# Patient Record
Sex: Female | Born: 1940
Health system: Southern US, Community
[De-identification: ages and names within clinical notes are randomized; demographics above are authoritative.]

## PROBLEM LIST (undated history)

## (undated) DIAGNOSIS — E1165 Type 2 diabetes mellitus with hyperglycemia: Secondary | ICD-10-CM

## (undated) DIAGNOSIS — N179 Acute kidney failure, unspecified: Secondary | ICD-10-CM

## (undated) DIAGNOSIS — R55 Syncope and collapse: Secondary | ICD-10-CM

## (undated) DIAGNOSIS — E785 Hyperlipidemia, unspecified: Secondary | ICD-10-CM

## (undated) DIAGNOSIS — I10 Essential (primary) hypertension: Secondary | ICD-10-CM

## (undated) DIAGNOSIS — H269 Unspecified cataract: Secondary | ICD-10-CM

## (undated) DIAGNOSIS — D689 Coagulation defect, unspecified: Secondary | ICD-10-CM

## (undated) DIAGNOSIS — M109 Gout, unspecified: Secondary | ICD-10-CM

## (undated) DIAGNOSIS — I610 Nontraumatic intracerebral hemorrhage in hemisphere, subcortical: Secondary | ICD-10-CM

## (undated) DIAGNOSIS — I700 Atherosclerosis of aorta: Secondary | ICD-10-CM

## (undated) DIAGNOSIS — K219 Gastro-esophageal reflux disease without esophagitis: Secondary | ICD-10-CM

## (undated) DIAGNOSIS — I2510 Atherosclerotic heart disease of native coronary artery without angina pectoris: Secondary | ICD-10-CM

## (undated) DIAGNOSIS — I639 Cerebral infarction, unspecified: Secondary | ICD-10-CM

## (undated) DIAGNOSIS — Z9181 History of falling: Secondary | ICD-10-CM

## (undated) DIAGNOSIS — R42 Dizziness and giddiness: Secondary | ICD-10-CM

## (undated) DIAGNOSIS — IMO0002 Reserved for concepts with insufficient information to code with codable children: Secondary | ICD-10-CM

## (undated) DIAGNOSIS — I219 Acute myocardial infarction, unspecified: Secondary | ICD-10-CM

## (undated) DIAGNOSIS — R51 Headache: Secondary | ICD-10-CM

## (undated) DIAGNOSIS — M1712 Unilateral primary osteoarthritis, left knee: Secondary | ICD-10-CM

## (undated) DIAGNOSIS — R519 Headache, unspecified: Secondary | ICD-10-CM

## (undated) DIAGNOSIS — Z86711 Personal history of pulmonary embolism: Secondary | ICD-10-CM

## (undated) DIAGNOSIS — M1990 Unspecified osteoarthritis, unspecified site: Secondary | ICD-10-CM

## (undated) DIAGNOSIS — G4730 Sleep apnea, unspecified: Secondary | ICD-10-CM

## (undated) HISTORY — PX: TUBAL LIGATION: SHX77

## (undated) HISTORY — DX: Gout, unspecified: M10.9

## (undated) HISTORY — PX: FOOT SURGERY: SHX648

## (undated) HISTORY — DX: History of falling: Z91.81

## (undated) HISTORY — PX: EYE SURGERY: SHX253

## (undated) HISTORY — DX: Headache: R51

## (undated) HISTORY — DX: Headache, unspecified: R51.9

## (undated) HISTORY — DX: Essential (primary) hypertension: I10

## (undated) HISTORY — DX: Nontraumatic intracerebral hemorrhage in hemisphere, subcortical: I61.0

## (undated) HISTORY — PX: TOTAL KNEE ARTHROPLASTY: SHX125

## (undated) HISTORY — DX: Coagulation defect, unspecified: D68.9

## (undated) HISTORY — DX: Morbid (severe) obesity due to excess calories: E66.01

## (undated) HISTORY — DX: Dizziness and giddiness: R42

## (undated) HISTORY — PX: PARTIAL HYSTERECTOMY: SHX80

## (undated) HISTORY — DX: Acute kidney failure, unspecified: N17.9

## (undated) HISTORY — DX: Unspecified osteoarthritis, unspecified site: M19.90

## (undated) HISTORY — PX: TONSILLECTOMY: SUR1361

## (undated) HISTORY — DX: Atherosclerotic heart disease of native coronary artery without angina pectoris: I25.10

## (undated) HISTORY — DX: Reserved for concepts with insufficient information to code with codable children: IMO0002

## (undated) HISTORY — DX: Atherosclerosis of aorta: I70.0

## (undated) HISTORY — DX: Unspecified cataract: H26.9

## (undated) HISTORY — DX: Hyperlipidemia, unspecified: E78.5

## (undated) HISTORY — DX: Personal history of pulmonary embolism: Z86.711

## (undated) HISTORY — DX: Type 2 diabetes mellitus with hyperglycemia: E11.65

---

## 1998-01-07 ENCOUNTER — Other Ambulatory Visit: Admission: RE | Admit: 1998-01-07 | Discharge: 1998-01-07 | Payer: Self-pay | Admitting: *Deleted

## 1998-03-28 ENCOUNTER — Ambulatory Visit: Admission: RE | Admit: 1998-03-28 | Discharge: 1998-03-28 | Payer: Self-pay | Admitting: Neurology

## 1998-08-23 ENCOUNTER — Ambulatory Visit: Admission: RE | Admit: 1998-08-23 | Discharge: 1998-08-23 | Payer: Self-pay | Admitting: Internal Medicine

## 1998-12-20 ENCOUNTER — Ambulatory Visit (HOSPITAL_COMMUNITY): Admission: RE | Admit: 1998-12-20 | Discharge: 1998-12-20 | Payer: Self-pay | Admitting: Otolaryngology

## 1998-12-20 ENCOUNTER — Encounter: Payer: Self-pay | Admitting: Otolaryngology

## 1999-02-01 ENCOUNTER — Encounter: Payer: Self-pay | Admitting: Otolaryngology

## 1999-02-02 ENCOUNTER — Ambulatory Visit (HOSPITAL_COMMUNITY): Admission: RE | Admit: 1999-02-02 | Discharge: 1999-02-03 | Payer: Self-pay | Admitting: Otolaryngology

## 1999-05-29 ENCOUNTER — Ambulatory Visit: Admission: RE | Admit: 1999-05-29 | Discharge: 1999-05-29 | Payer: Self-pay | Admitting: Otolaryngology

## 1999-08-08 ENCOUNTER — Other Ambulatory Visit: Admission: RE | Admit: 1999-08-08 | Discharge: 1999-08-08 | Payer: Self-pay | Admitting: *Deleted

## 2004-12-01 ENCOUNTER — Ambulatory Visit: Payer: Self-pay | Admitting: Family Medicine

## 2005-01-19 ENCOUNTER — Inpatient Hospital Stay (HOSPITAL_COMMUNITY): Admission: EM | Admit: 2005-01-19 | Discharge: 2005-01-26 | Payer: Self-pay | Admitting: *Deleted

## 2005-01-19 ENCOUNTER — Encounter (INDEPENDENT_AMBULATORY_CARE_PROVIDER_SITE_OTHER): Payer: Self-pay | Admitting: *Deleted

## 2005-02-28 ENCOUNTER — Ambulatory Visit (HOSPITAL_COMMUNITY): Admission: RE | Admit: 2005-02-28 | Discharge: 2005-02-28 | Payer: Self-pay | Admitting: Family Medicine

## 2007-06-22 ENCOUNTER — Other Ambulatory Visit: Payer: Self-pay

## 2007-06-22 ENCOUNTER — Emergency Department: Payer: Self-pay | Admitting: Emergency Medicine

## 2008-04-24 ENCOUNTER — Emergency Department (HOSPITAL_COMMUNITY): Admission: EM | Admit: 2008-04-24 | Discharge: 2008-04-25 | Payer: Self-pay | Admitting: Emergency Medicine

## 2008-05-05 ENCOUNTER — Ambulatory Visit: Payer: Self-pay | Admitting: Cardiovascular Disease

## 2008-05-12 ENCOUNTER — Ambulatory Visit: Payer: Self-pay

## 2009-03-22 ENCOUNTER — Observation Stay (HOSPITAL_COMMUNITY): Admission: EM | Admit: 2009-03-22 | Discharge: 2009-03-23 | Payer: Self-pay | Admitting: Emergency Medicine

## 2009-03-22 ENCOUNTER — Ambulatory Visit: Payer: Self-pay | Admitting: Internal Medicine

## 2009-04-24 DIAGNOSIS — G4730 Sleep apnea, unspecified: Secondary | ICD-10-CM

## 2009-04-24 HISTORY — DX: Sleep apnea, unspecified: G47.30

## 2009-05-13 ENCOUNTER — Ambulatory Visit: Payer: Self-pay | Admitting: Internal Medicine

## 2009-05-13 DIAGNOSIS — I10 Essential (primary) hypertension: Secondary | ICD-10-CM | POA: Insufficient documentation

## 2010-04-24 DIAGNOSIS — Z86711 Personal history of pulmonary embolism: Secondary | ICD-10-CM

## 2010-04-24 DIAGNOSIS — I219 Acute myocardial infarction, unspecified: Secondary | ICD-10-CM

## 2010-04-24 HISTORY — DX: Acute myocardial infarction, unspecified: I21.9

## 2010-04-24 HISTORY — DX: Personal history of pulmonary embolism: Z86.711

## 2010-05-26 NOTE — Assessment & Plan Note (Signed)
Summary: EST WAS SEEN BY NISHAN IN JAN WANTS DR. DAN TO F/U WITH PT IN...   Visit Type:  ec6 Primary Provider:  Dr. Terance Hart  CC:  no complaints.  History of Present Illness: 70 y/o female with h/o HTN, HL and diabetes.   Saw Dr. Eden Emms last year for CP and dyspnea. Had adenosine myoview which was   Recently admitted to [facility] with atypical CP. Cardiac markers negative. Thoguht to be Gi in nature.   No further CP. Occasional dyspnea with moderate activity which she attributes to gaining 10 or 15 pounds. Occasional fatigue. Denies snoring. No edema, orthopnea, pnod or palpitations.   Current Medications (verified): 1)  Actos 30 Mg Tabs (Pioglitazone Hcl) .Marland Kitchen.. 1 By Mouth Once Daily 2)  Aspirin 81 Mg Tbec (Aspirin) .... Take One Tablet By Mouth Daily 3)  Lisinopril-Hydrochlorothiazide 20-12.5 Mg Tabs (Lisinopril-Hydrochlorothiazide) .Marland Kitchen.. 1 By Mouth Two Times A Day 4)  Metoprolol Tartrate 100 Mg Tabs (Metoprolol Tartrate) .... Take One Tablet By Mouth Twice A Day 5)  Amlodipine Besylate 5 Mg Tabs (Amlodipine Besylate) .... Take One Tablet By Mouth Daily  Allergies (verified): 1)  ! Pcn  Review of Systems       As per HPI and past medical history; otherwise all systems negative.   Vital Signs:  Patient profile:   70 year old female Weight:      249 pounds Pulse rate:   50 / minute Pulse rhythm:   regular BP sitting:   142 / 60  (left arm) Cuff size:   large  Vitals Entered By: Mercer Pod (May 13, 2009 11:53 AM)  Physical Exam  General:  Gen: well appearing. no resp difficulty HEENT: normal Neck: supple. no JVD. Carotids 2+ bilat; no bruits. No lymphadenopathy or thryomegaly appreciated. Cor: PMI nondisplaced. Regular rate & rhythm. No rubs, gallops, murmur. Lungs: clear Abdomen: obese soft, nontender, nondistended. bowel sounds. Extremities: no cyanosis, clubbing, rash, edema Neuro: alert & orientedx3, cranial nerves grossly intact. moves all 4  extremities w/o difficulty. affect pleasant    Impression & Recommendations:  Problem # 1:  CHEST TIGHTNESS-PRESSURE-OTHER ([id number]) Resolved. No further work-up at this time.  Problem # 2:  HYPERTENSION, BENIGN (ICD-401.1) Mildly elevated will increase amlodipine to 10qd.  Problem # 3:  BRADYCARDIA (ICD-427.89) Decrease metorpolol to 50 two times a day. Consider change to coreg.   Patient Instructions: 1)  Your physician recommends that you schedule a follow-up appointment as needed. 2)  Your physician has recommended you make the following change in your medication: decrease lopressor to 50 mg twice a day, increase norvasc to 10 mg daily Prescriptions: AMLODIPINE BESYLATE 10 MG TABS (AMLODIPINE BESYLATE) Take one tablet by mouth daily  #30 x 6   Entered by:   Charlena Cross, RN, BSN   Authorized by:   Dolores Patty, MD, [facility]   Signed by:   Charlena Cross, RN, BSN on 05/13/2009   Method used:   Electronically to        [facility]* (retail)       [address] [place], [place]  [address]       Ph: [phone]       Fax: [phone]   RxID:   [id number] METOPROLOL TARTRATE 50 MG TABS (METOPROLOL TARTRATE) Take one tablet by mouth twice a day  #60 x 6   Entered by:   Charlena Cross, RN, BSN  Authorized by:   Dolores Patty, MD, [facility]   Signed by:   Charlena Cross, RN, BSN on 05/13/2009   Method used:   Electronically to        [facility]* (retail)       [address] [place], [place]  [address]       Ph: [phone]       Fax: [phone]   RxID:   [id number]

## 2010-07-27 LAB — CK TOTAL AND CKMB (NOT AT ARMC)
CK, MB: 0.9 ng/mL (ref 0.3–4.0)
Relative Index: INVALID (ref 0.0–2.5)
Total CK: 84 U/L (ref 7–177)

## 2010-07-27 LAB — COMPREHENSIVE METABOLIC PANEL
ALT: 13 U/L (ref 0–35)
AST: 18 U/L (ref 0–37)
Albumin: 3.3 g/dL — ABNORMAL LOW (ref 3.5–5.2)
Alkaline Phosphatase: 93 U/L (ref 39–117)
BUN: 19 mg/dL (ref 6–23)
CO2: 30 mEq/L (ref 19–32)
Calcium: 9.2 mg/dL (ref 8.4–10.5)
Chloride: 105 mEq/L (ref 96–112)
Creatinine, Ser: 1.01 mg/dL (ref 0.4–1.2)
GFR calc Af Amer: >60 mL/min (ref >60)
GFR calc non Af Amer: 55 mL/min — ABNORMAL LOW (ref >60)
Glucose, Bld: 125 mg/dL — ABNORMAL HIGH (ref 70–99)
Potassium: 3.5 mEq/L (ref 3.5–5.1)
Sodium: 141 mEq/L (ref 135–145)
Total Bilirubin: 0.6 mg/dL (ref 0.3–1.2)
Total Protein: 6.9 g/dL (ref 6.0–8.3)

## 2010-07-27 LAB — CBC
HCT: 34 % — ABNORMAL LOW (ref 36.0–46.0)
HCT: 34.5 % — ABNORMAL LOW (ref 36.0–46.0)
HCT: 35.1 % — ABNORMAL LOW (ref 36.0–46.0)
Hemoglobin: 11.3 g/dL — ABNORMAL LOW (ref 12.0–15.0)
Hemoglobin: 11.4 g/dL — ABNORMAL LOW (ref 12.0–15.0)
Hemoglobin: 11.8 g/dL — ABNORMAL LOW (ref 12.0–15.0)
MCHC: 33.2 g/dL (ref 30.0–36.0)
MCHC: 33.5 g/dL (ref 30.0–36.0)
MCV: 87.7 fL (ref 78.0–100.0)
MCV: 88.9 fL (ref 78.0–100.0)
Platelets: 229 10*3/uL (ref 150–400)
Platelets: 246 10*3/uL (ref 150–400)
Platelets: 251 10*3/uL (ref 150–400)
RBC: 3.88 MIL/uL (ref 3.87–5.11)
RBC: 4.01 MIL/uL (ref 3.87–5.11)
RDW: 15.8 % — ABNORMAL HIGH (ref 11.5–15.5)
RDW: 15.8 % — ABNORMAL HIGH (ref 11.5–15.5)
WBC: 6.3 10*3/uL (ref 4.0–10.5)
WBC: 7.4 10*3/uL (ref 4.0–10.5)
WBC: 7.6 10*3/uL (ref 4.0–10.5)

## 2010-07-27 LAB — BASIC METABOLIC PANEL
BUN: 16 mg/dL (ref 6–23)
CO2: 28 mEq/L (ref 19–32)
Calcium: 9.1 mg/dL (ref 8.4–10.5)
Chloride: 103 mEq/L (ref 96–112)
Creatinine, Ser: 0.83 mg/dL (ref 0.4–1.2)
GFR calc Af Amer: >60 mL/min (ref >60)
GFR calc non Af Amer: >60 mL/min (ref >60)
GFR calc non Af Amer: >60 mL/min (ref >60)
Glucose, Bld: 110 mg/dL — ABNORMAL HIGH (ref 70–99)
Potassium: 3.7 mEq/L (ref 3.5–5.1)
Potassium: 3.9 mEq/L (ref 3.5–5.1)
Sodium: 139 mEq/L (ref 135–145)
Sodium: 139 mEq/L (ref 135–145)

## 2010-07-27 LAB — DIFFERENTIAL
Basophils Absolute: 0 10*3/uL (ref 0.0–0.1)
Basophils Absolute: 0 10*3/uL (ref 0.0–0.1)
Basophils Relative: 0 % (ref 0–1)
Basophils Relative: 1 % (ref 0–1)
Eosinophils Absolute: 0.1 10*3/uL (ref 0.0–0.7)
Eosinophils Absolute: 0.1 10*3/uL (ref 0.0–0.7)
Eosinophils Relative: 1 % (ref 0–5)
Eosinophils Relative: 1 % (ref 0–5)
Lymphocytes Relative: 28 % (ref 12–46)
Lymphocytes Relative: 29 % (ref 12–46)
Lymphs Abs: 2.1 10*3/uL (ref 0.7–4.0)
Lymphs Abs: 2.2 10*3/uL (ref 0.7–4.0)
Monocytes Absolute: 0.3 10*3/uL (ref 0.1–1.0)
Monocytes Absolute: 0.4 10*3/uL (ref 0.1–1.0)
Monocytes Relative: 5 % (ref 3–12)
Monocytes Relative: 6 % (ref 3–12)
Neutro Abs: 4.7 10*3/uL (ref 1.7–7.7)
Neutro Abs: 5 10*3/uL (ref 1.7–7.7)
Neutrophils Relative %: 64 % (ref 43–77)
Neutrophils Relative %: 65 % (ref 43–77)

## 2010-07-27 LAB — GLUCOSE, CAPILLARY
Glucose-Capillary: 104 mg/dL — ABNORMAL HIGH (ref 70–99)
Glucose-Capillary: 95 mg/dL (ref 70–99)

## 2010-07-27 LAB — POCT CARDIAC MARKERS
CKMB, poc: <1 ng/mL — ABNORMAL LOW (ref 1.0–8.0)
CKMB, poc: <1 ng/mL — ABNORMAL LOW (ref 1.0–8.0)
Myoglobin, poc: 71.4 ng/mL (ref 12–200)
Myoglobin, poc: 83.3 ng/mL (ref 12–200)
Troponin i, poc: <0.05 ng/mL (ref 0.00–0.09)
Troponin i, poc: <0.05 ng/mL (ref 0.00–0.09)

## 2010-07-27 LAB — D-DIMER, QUANTITATIVE: D-Dimer, Quant: 6.8 ug/mL-FEU — ABNORMAL HIGH (ref 0.00–0.48)

## 2010-07-27 LAB — CARDIAC PANEL(CRET KIN+CKTOT+MB+TROPI)
CK, MB: 0.9 ng/mL (ref 0.3–4.0)
Total CK: 75 U/L (ref 7–177)
Troponin I: 0.01 ng/mL (ref 0.00–0.06)

## 2010-07-27 LAB — TROPONIN I: Troponin I: 0.01 ng/mL (ref 0.00–0.06)

## 2010-07-27 LAB — AMMONIA: Ammonia: 33 umol/L (ref 11–35)

## 2010-08-08 LAB — DIFFERENTIAL
Eosinophils Absolute: 0.1 10*3/uL (ref 0.0–0.7)
Eosinophils Relative: 2 % (ref 0–5)
Lymphs Abs: 2.1 10*3/uL (ref 0.7–4.0)
Monocytes Absolute: 0.6 10*3/uL (ref 0.1–1.0)
Monocytes Relative: 7 % (ref 3–12)

## 2010-08-08 LAB — CBC
HCT: 37.4 % (ref 36.0–46.0)
MCV: 85.7 fL (ref 78.0–100.0)
RBC: 4.37 MIL/uL (ref 3.87–5.11)
WBC: 8.6 10*3/uL (ref 4.0–10.5)

## 2010-08-08 LAB — COMPREHENSIVE METABOLIC PANEL
ALT: 20 U/L (ref 0–35)
AST: 21 U/L (ref 0–37)
Albumin: 3.3 g/dL — ABNORMAL LOW (ref 3.5–5.2)
CO2: 28 mEq/L (ref 19–32)
Calcium: 9.2 mg/dL (ref 8.4–10.5)
GFR calc Af Amer: >60 mL/min (ref >60)
GFR calc non Af Amer: >60 mL/min (ref >60)
Sodium: 139 mEq/L (ref 135–145)

## 2010-08-08 LAB — URINALYSIS, ROUTINE W REFLEX MICROSCOPIC
Bilirubin Urine: NEGATIVE
Glucose, UA: NEGATIVE mg/dL
Hgb urine dipstick: NEGATIVE
Ketones, ur: NEGATIVE mg/dL
Specific Gravity, Urine: 1.02 (ref 1.005–1.030)
pH: 6 (ref 5.0–8.0)

## 2010-08-08 LAB — CK TOTAL AND CKMB (NOT AT ARMC)
Relative Index: 0.9 (ref 0.0–2.5)
Total CK: 129 U/L (ref 7–177)

## 2010-09-06 NOTE — Assessment & Plan Note (Signed)
[name]                            CARDIOLOGY OFFICE NOTE   K[place], [name]                      MRN:          [id number]  DATE:05/05/2008                            DOB:          05-28-1940    A 70 year old diabetic referred from [name] from the ER.  She was  there about 10 days ago.  The patient says she was there for a viral  illness.  She had multiple somatic complaints including low-grade fever,  cough, nausea, muscle cramps.  These symptoms lasted 5-6 days; however,  in the ER she complained of chest pain.  She ruled out for myocardial  infarction.  There were no acute EKG changes.  She was subsequently  referred to the ER for further workup her chest pain.  In general, the  patient does not get chest pain.  She has some difficulty walking due to  previous right knee surgery.  She gets some exertional dyspnea.  There  is no palpitations.  There is no previous history of coronary artery  disease.  The patient's viral illnesses subsided.  She has no further  nausea or vomiting.  There has been no diarrhea.   In regards to her dyspnea, it seems functional.  She is significantly  overweight.  She is a nonsmoker.  She does not have history of chronic  lung disease.  Initially, her chest pain was atypical.  She had a crampy  feeling in her chest after her nausea and vomiting and was in the  setting of a febrile illness where she felt diffuse myalgias and her  symptoms are now improved.   Her past medical history is otherwise remarkable for hypertension,  diabetes treated with oral hypoglycemics for 2 years, previous right  knee replacement done in [place].   She is allergic to PENICILLIN.   She is on aspirin 81 mg a day, Actos 30 a day, metoprolol 100 a day,  lisinopril/hydrochlorothiazide 20/12.5, amlodipine 5 a day.   Family history is remarkable for mother dying of breast cancer and  father having diabetes.  He is also  deceased.   Review of systems is otherwise negative.  She does not check her  diabetes on a regular basis in terms of glucose testing.  She does not  know her last hemoglobin A1c.   PHYSICAL EXAMINATION:  GENERAL:  Remarkable for an obese black female in  no distress.  Affect is appropriate.  VITAL SIGNS:  Weight is 240, respiratory rate 14, afebrile.  HEENT:  Unremarkable.  NECK:  Carotids normal without bruit.  No lymphadenopathy, thyromegaly,  JVP elevation.  LUNGS:  Clear.  Good diaphragmatic motion.  No wheezing.  HEART:  S1 and S2.  Distant heart sounds.  PMI not palpable.  ABDOMEN:  Protuberant.  Bowel sounds positive.  No AAA.  No tenderness.  No bruit.  No hepatosplenomegaly, hepatojugular reflux, or tenderness.  EXTREMITIES:  Distal pulses are intact.  No edema.  NEUROLOGIC:  Nonfocal.  SKIN:  Warm and dry.  MUSCULOSKELETAL:  No muscular weakness status post right knee  replacement.   EKG shows  sinus rhythm, nonspecific ST-T wave changes, left axis  deviation, poor R-wave progression.  QT interval is 552.   IMPRESSION:  1. Chest pain atypical in the setting of multiple coronary risk      factors.  Followup adenosine Myoview.  2. Recent viral illness seems to be resolved, doubt anginal      equivalent.  3. Hypertension, currently well controlled.  Continue low-sodium diet      and current dose of metoprolol and lisinopril as well as      amlodipine.  4. Diabetes, poorly controlled likely.  Followup hemoglobin A1c.  The      patient should likely test her sugar at least once a day to make      sure her fastings are under 120.  I will leave this up per primary      care MD.   The patient lives in [name].  She is willing to see our [place] [name].  For the time being if her adenosine Myoview is abnormal, I will  see her back to arrange heart catheterization.  Otherwise, she will  follow up in the [place] [name] with Dr. Gala [name] in a year.     [name].  Kristen Emms, MD, [name]  Electronically Signed    PCN/MedQ  DD: 05/05/2008  DT: 05/05/2008  Job #: [id number]   cc:   Kristen Baseman, MD

## 2010-09-09 NOTE — Consult Note (Signed)
Kristen, [name] NO.:  [id number]   MEDICAL RECORD NO.:  [id number]          PATIENT TYPE:  INP   LOCATION:  6703                         FACILITY:  [facility]   PHYSICIAN:  Deanna Artis. Hickling, M.D.DATE OF BIRTH:  08/08/1940   DATE OF CONSULTATION:  01/25/2005  DATE OF DISCHARGE:                                   CONSULTATION   CHIEF COMPLAINT:  1.  Vertigo.  2.  Diplopia.  3.  Blurred vision.  4.  Unsteady gait.   HISTORY OF PRESENT ILLNESS:  K[name] is a morbidly obese 70 year old  right-handed woman with severe hypertension, recurrent vertigo and headache.   The patient was admitted to [facility] on January 18, 2005, with a blood pressure of 223/86, headache and chest pain.   The patient had an organic gait disorder.  She had fairly unremarkable  laboratory studies.  She had an MRI scan that showed evidence of an acute  infarction of right posterior limb of the internal capsule and a remote left  subcortical lacunar infarction.  These are likely related to hypertension.   The patient has had vigorous treatment of her blood pressure.  She has also  had work-up for stroke which has included serum homocystine of 13.60 and  16.43(treated with Foltx).  Lipid panel with cholesterol 191, triglycerides  86, HDL cholesterol 49, VLDL 17. LDL 125.  TSH 5.24, RPR nonreactive.  She  did not have a hemoglobin A1c despite having a number of elevated glucoses  in the hospital.   Patient also had a carotid Doppler study that did not show hemodynamically  significant lesions in the carotids.  There was mild heterogeneous plaque on  the common carotid artery and some focal heterogeneous plaque in the  proximal right internal carotid, mild heterogeneous plaque in the common  carotid and internal carotid on the left.  Vertebral flow was antegrade  bilaterally.  Patient has not had an echocardiogram.  I cannot see evidence  of an EKG in the  chart either.  I think that given her hypertensive  problems, that those should be done as well.   She has also not had intracranial Dopplers, however, with MRI/MRA to see the  patient because last night she had an episode of vertigo, blurred vision,  diplopia, and unsteady gait.  She felt warmth in her body, numbness in her  right face.  She felt that her face drew.  She had bilateral numbness in her  lower extremities.  These symptoms cleared in less than 15 minutes.  I  suspect that for the most part, that they were anxiety driven rather than  TIA.  Patient tells me that she has had a longstanding history of vertigo.   Aggrenox one twice daily, Metoprolol 100 mg twice daily, hydrochlorothiazide  12.5 mg per day, Lisinopril 40 mg per day.  Patient had been on Lovenox 40  mg at night time, Protonix 40 mg per day, and meclizine 25 mg every six  hours as well as Zocor 20 mg in the evening.   ALLERGIES:  PENICILLIN.  SOCIAL HISTORY:  [place]  She is  single.  She has two grown children, both healthy.   REVIEW OF SYSTEMS:  Negative except for the extensive history noted above.   PHYSICAL EXAMINATION:  VITAL SIGNS:  Temperature 98.1, blood pressure  122/80, resting pulse 88, respirations 16, oxygen saturation 98%, height 63  inches, weight 222.7 pounds.  HEENT:  No signs of infection.  NECK:  Supple with full range of motion.  No cranial or cervical bruits.  LUNGS:  Clear to auscultation.  CARDIOVASCULAR:  No murmurs, pulses normal.  ABDOMEN:  Protuberant.  Bowel sounds normal.  No hepatosplenomegaly.  EXTREMITIES:  Well formed without edema, cyanosis, alterations in tone or  tight heel cords.  NEUROLOGIC:  Mental status:  Patient was awake and alert, attentive, no  dysphagia, dyspraxia.  Cranial nerves:  Round reactive pupils.  Normal  fundi.  Full visual fields to double simultaneous stimuli.  Extraocular  movements full and conjugate.   Symmetric facial strength and sensation.  Air  condition greater than bone conduction bilaterally.  She is able to protrude  her tongue and elevate her uvula in the midline.  She had a positive Dix-  Hallpike maneuver with her right ear down looking to the left. She had  complaints of vertigo and had both horizontal and oscillatory nystagmus.  It  is characteristic of benign positional vertigo.  This did not happen when  the left ear was down.  Motor examination:  Normal strength, tone and mass.  Good fine motor movements, no pronator drift.  Sensation intact to cold,  vibration, stereognosis.  There is a mild peripheral stocking bilaterally.  Cerebellar examination:  Good finger-to-nose, rapid repetitive movements.  Gait:  Slightly broad based, cautious.  She felt somewhat dizzy when she sat  up which is likely her positional vertigo.  Deep tendon reflexes were normal  at the biceps, brachial radialis and diminished at the triceps, virtually  absent at the knees, absent at the ankles.  Patient had bilateral flexor  plantar responses.   IMPRESSION:  1.  Patient has benign positional vertigo involving the right ear.  2.  She had a subacute lacunar infarction involving the right posterior limb      of the internal capsule related to uncontrolled hypertension, 434.10,      404.10.  3.  She is morbidly obese, 278.01.  4.  I suspect that she may have a prediabetic state if not diabetic state.      This needs to be checked for the hemoglobin A1c.  Because of her      hypertension, she also needs to have 2-D echocardiogram  and probably      should have an EKG, although I will leave that to the discretion of her      physicians.   She should be started on only one Aggrenox a day, pretreated with 500 mg of  extra strength Tylenol half an hour before in order to prevent her from getting a headache.  The Aggrenox should be increased to full dose in two  weeks' time.  She needs physical therapy  consult, needs an outpatient  evaluation at the vestibular clinic at [facility].  I do not believe she needs further neurodiagnostic work-  up at this time.  I agree with the rest of the management for secondary  stroke prevention.  I do not believe the stroke is responsible for the  symptoms that  she complained of last night.      Deanna Artis. Sharene Skeans, M.D.  Electronically Signed     [facility]/MEDQ  D:  01/25/2005  T:  01/26/2005  Job:  [id number]   cc:   Dr. Maryagnes Amos A Team

## 2010-09-09 NOTE — Discharge Summary (Signed)
NAME:  [name]               ACCOUNT NO.:  [id number]   MEDICAL RECORD NO.:  [id number]          PATIENT TYPE:  INP   LOCATION:  6733                         FACILITY:  [facility]   PHYSICIAN:  Mobolaji B. Bakare, M.D.DATE OF BIRTH:  13-Feb-1941   DATE OF ADMISSION:  01/18/2005  DATE OF DISCHARGE:  01/26/2005                                 DISCHARGE SUMMARY   FINAL DIAGNOSES:  1.  Subacute lacunar infarct involving the right posterior limb of the      internal capsule.  2.  Benign positional vertigo.  3.  Morbid obesity.  4.  Hyperlipidemia.  5.  Hypertension, uncontrolled.  6.  Hyperglycemia.  7.  Atypical chest pain.  8.  Hyperhomocysteinemia.   PROCEDURES:  1.  Two-dimensional echocardiogram done on the 28th of September 2006; this      was inadequate to evaluate left ventricular wall motion abnormality.      Ejection fraction was 55% to 60%.  Doppler parameters are consistent      with a mild left ventricular relaxation.  2.  Carotid Dopplers showed no hemodynamically significant internal carotid      artery stenosis or external carotid stenosis on the right.  Vertebral      artery was antegrade on the right, no significant abnormality on the      left.  3.  Chest x-ray showed mild cardiomegaly, low volumes, no acute disease.  4.  MRA/MRI of the head showed subacute infarct in the posterior limb of the      internal capsule and mild stenosis of the proximal left anterior      cerebral artery.  5.  Head CT scan:  Small vessel ischemic changes with no acute infarct or      bleed.  6.  Esophagogram done on January 24, 2005:  No significant abnormalities.      Scattered tertiary esophageal contractions with normal peristalsis,  no      esophageal strictures or masses noted.  No hiatal hernia and      gastroesophageal reflux not elicited.   CONSULTANT:  1.  Neurology consult.  2.  ENT consult, Dr. Jenne Pane.   HISTORY OF PRESENT ILLNESS:  Please refer to the admission  H&P.   HOSPITAL COURSE:  PROBLEM #1 - SUBACUTE INFARCT:  The patient had been on  aspirin prior to hospitalization.  His subacute infarct is most likely  secondary to uncontrolled hypertension.  The patient's blood pressure was  optimized during this hospitalization with lisinopril and  hydrochlorothiazide and metoprolol.  Her abdomen was soft started on  Aggrenox prior to discharge.  He was noted to have elevated homocysteine  level and was started on Foltx once a day.  She did complain of vertigo,  diplopia and blurred vision with unsteady gait.  She indeed had vertigo,  which is benign positional vertigo.  She was evaluated by ENT and the  patient was started on meclizine.  She is to follow up with Dr. Jenne Pane in the  office.  In addition, she will continue therapy at vertigo clinic.  It was  recommended that she  should have an outpatient audiogram for which would  follow up with Dr. Jenne Pane.  There was no residual neurological deficit during  the hospitalization, although she did complain of transient blurred vision  and unsteady gait and diplopia, no clinical findings to confirm these  complaints.  She did well with physical therapy and she did not require any  physical therapy followup.   PROBLEM #2 - HYPERLIPIDEMIA:  She was started on Lipitor and given dietary  counseling.   PROBLEM #3 - HYPERTENSION:  This was controlled with lisinopril,  hydrochlorothiazide and metoprolol.  The patient could name the names of her  previous medications.   PROBLEM #4 - HYPERGLYCEMIA:  Hemoglobin A1c was 6.9.  Her fasting blood  sugar was slightly elevated at 114.  She was counseled on weight loss and  lifestyle changes.  Fasting blood sugar should be repeated in 3 month.   PROBLEM #5 - CHEST PAIN:  This resolved on admission. She ruled out for MI.  There were no significant findings on 2-D echocardiogram.  This was felt to  be noncardiac in nature.  Due to her risk factor, it was recommended  that  she should have an outpatient stress test.   PROBLEM #6 - DYSPHAGIA:  The patient complained of difficulty with  swallowing.  She was evaluated by the speech therapist and she was  recommended to have a modified barium swallow and esophageal esophagogram;  this was negative for any abnormality. She did not have any structural  abnormality.   DISCHARGE MEDICATIONS:  1.  Aggrenox one at bedtime for 2 weeks, then two, then one twice daily.  2.  Metoprolol 100 mg two times a day.  3.  Lisinopril/hydrochlorothiazide 20/12.5 mg one every day.  4.  Meclizine 25 mg every 6 hours p.r.n. for dizziness.  5.  Lipitor 10 mg once a day.  6.  Foltx one tablet daily.  7.  Xanax 0.5 mg q.6 h. p.r.n. for anxiety.   FOLLOWUP:  Follow up with Dr. Christia Reading and follow up with her regular  doctor in 1-2 weeks.  Follow up with the office for vestibular  rehabilitation.   RECOMMENDATIONS:  1.  Audiogram. Follow up with Dr Jenne Pane  2.  Outpatient stress test. Follow up with PCP ( Patient cannot remember      name of PCP, She would request for hospital discharge summary)   DISCHARGE CONDITION:  The patient was hemodynamically stable without any  neurological deficit.  Blood pressure was 132/77 on discharge.   PERTINENT LABORATORY FINDINGS:  Hemoglobin A1c 6.9.  White cell count 6.7,  hemoglobin 12.6, platelets 251,000.  Sodium 140, BUN 13, creatinine 1.0.  RPR nonreactive.  Homocysteine 16.43.  Lipid profile:  Total cholesterol  168, HDL 48, triglycerides 133, LDL 123, Phosphorus  4.4, magnesium 2.1.  TSH 5.241.      Mobolaji B. Corky Downs, M.D.  Electronically Signed     MBB/MEDQ  D:  02/12/2005  T:  02/13/2005  Job:  [id number]   cc:   Antony Contras, MD  Fax: [phone]   Deanna Artis. Sharene Skeans, M.D.  Fax: [phone]

## 2010-09-09 NOTE — Op Note (Signed)
NAMEKENDRIA, HALBERG NO.:  [id number]   MEDICAL RECORD NO.:  [id number]          PATIENT TYPE:  OBV   LOCATION:  3742                         FACILITY:  [facility]   PHYSICIAN:  Antony Contras, MD     DATE OF BIRTH:  1940-09-05   DATE OF PROCEDURE:  01/21/2005  DATE OF DISCHARGE:                                 OPERATIVE REPORT   CHIEF COMPLAINT:  Dizziness and left earache.   HISTORY OF PRESENT ILLNESS:  The patient is a 70 year old African-American  female with a little over a year history of episodic left earache associated  with dizziness. Her first episode occurred last year and was minor and did  require medical attention.  She has had 3 or 4 episodes this year, each  starting with a vague headache that progresses to left earache and  associated dizziness. She describes the dizziness as a room spinning  feeling, as well as a lightheaded feeling.  It seems to affect her most when  she is lying on either side, but does not effect her when she is on her  back.  She notices it also when she sits up but denies it being induced by  changes of position.  She is able to walk without problems.  She came into  the hospital due to these complaints and associated nausea and vomiting.  Her ear pain is focal to the left ear and is not associated with hearing  loss, ear drainage, ringing of the ear, or official weakness.  She has not  had previous surgery on the ears or other ear disease. While in the  hospital, she was referred for vestibular rehab and the physical therapist  suggested an ENT evaluation because of the ear pain.   PAST MEDICAL HISTORY:  1.  Hypertension.  2.  Obesity.   MEDICATIONS:  A blood pressure medicine.   ALLERGIES:  PENICILLIN.   FAMILY HISTORY:  Cancer and diabetes.   SOCIAL HISTORY:  The patient lives in [place] and is single and divorced.  She denies alcohol or tobacco use.   REVIEW OF SYSTEMS:  Upon admission, she was found to have a  blood pressure  in the 220s/80s. She also had some chest pain associated and has received a  workup for this during her stay.  All other systems are negative.   PHYSICAL EXAMINATION:  VITAL SIGNS:  Temperature 99.8, pulse 70,  respirations 20, blood pressure 126/63.  Oxygen saturation 97% on room air.  GENERAL:  The patient is alert and without distress.  She is oriented.  HEENT:  Ear exam-both tympanic membranes and external auditory canals are  normal by examination.  The middle ear spaces are aerated. There is no  evidence of infection on either side.  The patient has focal tenderness in  the infra-auricular region.  She is nontender postauricularly.  Her TMJs are  not particularly tender.  She is able to open and close her mouth without  difficulty.  Oral cavity and oropharynx exam is unremarkable with upper and  lower dentures.  Nasal exam is unremarkable.  NECK  EXAM:  Reveals no mass or lymphadenopathy.  NEUROLOGIC:  Cranial nerve exam is unremarkable.   LABORATORY:  White blood count 7.5, hemoglobin 13.1, platelets 260.  Sodium  140, potassium 4.0, chloride 103, bicarb 28, BUN 19, creatinine 1.2,  glucose 105, calcium 9.2, magnesium 2.1, phosphorus 4.4,   ASSESSMENT:  Ms. Phagan is a 70 year old African-American female with  several episodes of headache, left ear pain, and dizziness. The etiology is  unclear.  She does not appear to have an infection of the left ear and her  pain is localized slightly below the ear.  Her vertigo may be a benign  paroxysmal positional vertigo process, but this would not cause pain in the  ear.  She is clear that her dizzy feeling only occurs while she is having  pain.  Other etiologies should be explored such as cervical pain, and/or  basilar artery compression.   RECOMMENDATIONS:  I do not see an otolaryngologic reason why she cannot  participate in vestibular rehab, and feel that this may be helpful.  I would  also recommend that she have an  audiogram performed to assess hearing.  All  of this can be done as an outpatient.  I will follow the patient while she  is in the hospital.           ______________________________  Antony Contras, MD     DDB/MEDQ  D:  01/21/2005  T:  01/21/2005  Job:  [id number]

## 2010-09-09 NOTE — H&P (Signed)
NAME:  Kristen [name], Kristen [name] NO.:  [id number]   MEDICAL RECORD NO.:  [id number]          PATIENT TYPE:  INP   LOCATION:  3742                         FACILITY:  [facility]   PHYSICIAN:  Lonia Blood, M.D.      DATE OF BIRTH:  Dec 19, 1940   DATE OF ADMISSION:  01/18/2005  DATE OF DISCHARGE:                                HISTORY & PHYSICAL   PRIMARY CARE PHYSICIAN:  The patient is currently unassigned. Her primary  care physician is in [place].   CHIEF COMPLAINT:  Severe dizziness, nausea and vomiting.   HISTORY OF PRESENT ILLNESS:  The patient is a 70 year old with history of  hypertension and recurrent vertigo. She has had problems before with vertigo  and inner ear pain. She came in today secondary to worsening vertigo for the  past 24 hours. The patient said this was associated with some nausea and she  had some vomiting. She also had some frontal headache. Denied any fever. She  denied any cough or cold symptoms. The patient did report inner ear pain,  especially on the left mainly. She has not been to see her doctor but worse.  She realized this is getting worse. She is unable to stand and the whole  room is spinning around her. She decided to come to the ER.   In the ER, however, she was found to have a blood pressure of 223/86 on  arrival and elements of bradycardia which is new. She also reported some  chest pain which is mainly retrosternal and described as pressure. Per  patient, her chest pain was somewhat 6 out of 10. She also said that she has  had this before whenever she had problems with her vertigo. She denied any  radiation of her chest pain. Denied any diaphoresis.   PAST MEDICAL HISTORY:  Mainly hypertension and previous vertigo. The patient  is also obese.   ALLERGIES:  The patient is allergic to PENICILLIN.   MEDICATIONS:  She is taking some blood pressure medicine but she does not  remember the name.   SOCIAL HISTORY:  The patient lives in  [place]. She is single, divorced.  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 Denied  any tobacco or alcohol use.   FAMILY HISTORY:  Both parents are deceased. Father had cancer and her mother  had diabetes. The patient has two grown up children, both healthy.   REVIEW OF SYSTEMS:  A 10-point review of systems is performed and is  essentially per HPI.   PHYSICAL EXAMINATION:  VITAL SIGNS:  On exam, temperature 97.4, blood  pressure 223/86 sitting and 234/92 standing, 224/118 lying. Her pulse is 61  but dropped into the 40s. Respiratory rate 24. Saturations 100% on room air.  GENERAL:  An obese but pleasant woman in no acute distress. Very conversant.  HEENT:  EOMI.  NECK:  Supple. No JVD. No lymphadenopathy.  LUNGS:  She has good air entry bilaterally. No wheezes or rales.  CARDIOVASCULAR:  She was slightly bradycardic with distant heart sounds.  Sound was regular.  ABDOMEN:  Obese. Soft and nontender. Positive bowel sounds.  EXTREMITIES:  No clubbing, cyanosis, or edema.  NEUROLOGICAL:  The patient seemed to have cranial nerves seem to be intact  II to XII. No focal findings found. Reflexes 2+ bilaterally. Muscle tone is  5/5 upper and lower extremities respectively. She has good coordination for  the most part, although the finger-to-nose seemed shaky. The patient has no  visible nystagmus; however, she had poor gait. Romberg's is positive. Her  ear exam shows tenderness on the left but good light reflex bilaterally. No  visible abscess, bleeding, or bulging of the tympanic membrane.   LABORATORY DATA:  Sodium 139, potassium 3.3, chloride is 105, BUN 9, glucose  106, bicarbonate of 30, creatinine 1.0. White count is 8.1, hemoglobin 12.7  with platelet count 237,000. Her UA essentially negative. Initial cardiac  enzymes are also negative. EKG shows sinus bradycardia with a rate of 47.  She has normal intervals. There is T-wave inversion mainly V3, V4, V5  and  V6. This, however, was noted on previous EKG from October 2000 except that  she was not bradycardic then. Chest x-ray showed mild cardiomegaly,  otherwise no acute changes.   ASSESSMENT:  This is a 70 year old female with history of hypertension, left  inner ear pain, and obesity presenting with severe vertigo as well as  hypertensive urgency. The differential of the patient's symptoms including  her chest pain could be central versus peripheral vertigo. It is more likely  peripheral based on the history of recurrence, the fact that she had nausea  and vomiting with the episode, as well as the inner ear pain. However, the  patient is also at risk for cerebrovascular accident including hypertension,  unknown cholesterol status, her obesity, and the fact that she has  associated headaches. She also has some chest pain but mainly pressure with  the EKG with the bradycardia and ST flattening and T-wave inversion. The  patient may be at risk for both cardiac disease as well as cerebrovascular  accident. We have no records on the patient situation. As such, we will  proceed to treat her and work her up for these causes as follows.   PLAN:  1.  Severe vertigo:  I will treat the patient as if she has peripheral while      working her up for possible CVA. Since CT is not sensitive for posterior      circulation, I will straight for MRI/MRA of the brain to rule out acute      CVA of the posterior circulation. In the meantime, I will do the workup      including checking for homocystine, fasting lipid panel, TSH. I will put      her on some aspirin at this time and control her blood pressure      appropriately. I will get PT and OT to work with the patient.  2.  Severe hypertension:  This is looks hypertensive urgency. The patient      does not remember what she is taking; however, with severe bradycardia I      will give her something that will help her blood pressure without     necessarily  dropping her heart rate. I will give her some Clonidine      patch, add some Altace, and then keep on IV hydralazine as needed for      systolic blood pressure above 329. I will also check a 2-D      echocardiogram since  she may be having some hypertensive cardiomyopathy.  3.  Bradycardia:  This may be related to some vagal maneuver from her      vertigo. The patient may also be on some beta blockers at home or      calcium channel blockers which may have caused bradycardia even though      her blood pressure is high. At this time, we will put the patient on      telemetry, check cardiac enzymes, 2-D echocardiogram, and follow her      presentation. If anything unusual happens based on the results, we will      get cardiac consult.  4.  Chest pain:  This is less likely to be a MI but based on the EKG      findings that changed from her previous EKG and also risk factors, I      will go ahead and rule her out for MI. I will proceed also with checking      the 2-D echocardiogram and possible cardiac consult for risk      stratification.  5.  Hypokalemia:  We will go ahead and replete her potassium. The patient      may have been on diuretics at home which may have caused this.  6.  Obesity:  This patient has been counseled also.      Lonia Blood, M.D.  Electronically Signed     LG/MEDQ  D:  01/18/2005  T:  01/19/2005  Job:  [id number]

## 2010-09-12 ENCOUNTER — Ambulatory Visit: Payer: Self-pay | Admitting: Emergency Medicine

## 2010-09-13 ENCOUNTER — Ambulatory Visit: Payer: Self-pay | Admitting: Emergency Medicine

## 2010-09-16 LAB — PATHOLOGY REPORT

## 2010-11-04 ENCOUNTER — Emergency Department (HOSPITAL_COMMUNITY)
Admission: EM | Admit: 2010-11-04 | Discharge: 2010-11-04 | Disposition: A | Discharge status: Home / Self Care | Payer: No Typology Code available for payment source | Attending: Emergency Medicine | Admitting: Emergency Medicine

## 2010-11-04 DIAGNOSIS — M25519 Pain in unspecified shoulder: Secondary | ICD-10-CM | POA: Insufficient documentation

## 2010-11-04 DIAGNOSIS — E7800 Pure hypercholesterolemia, unspecified: Secondary | ICD-10-CM | POA: Insufficient documentation

## 2010-11-04 DIAGNOSIS — Z79899 Other long term (current) drug therapy: Secondary | ICD-10-CM | POA: Insufficient documentation

## 2010-11-04 DIAGNOSIS — I10 Essential (primary) hypertension: Secondary | ICD-10-CM | POA: Insufficient documentation

## 2010-11-04 DIAGNOSIS — R071 Chest pain on breathing: Secondary | ICD-10-CM | POA: Insufficient documentation

## 2010-11-04 DIAGNOSIS — M542 Cervicalgia: Secondary | ICD-10-CM | POA: Insufficient documentation

## 2010-11-04 LAB — GLUCOSE, CAPILLARY: Glucose-Capillary: 117 mg/dL — ABNORMAL HIGH (ref 70–99)

## 2010-11-09 ENCOUNTER — Encounter: Payer: Self-pay | Admitting: Internal Medicine

## 2010-12-10 ENCOUNTER — Emergency Department (HOSPITAL_COMMUNITY): Payer: Medicare Other

## 2010-12-10 ENCOUNTER — Inpatient Hospital Stay (HOSPITAL_COMMUNITY)
Admission: EM | Admit: 2010-12-10 | Discharge: 2010-12-15 | DRG: 176 | Disposition: A | Discharge status: Home / Self Care | Payer: Medicare Other | Attending: Cardiology | Admitting: Cardiology

## 2010-12-10 DIAGNOSIS — Z880 Allergy status to penicillin: Secondary | ICD-10-CM

## 2010-12-10 DIAGNOSIS — I824Z9 Acute embolism and thrombosis of unspecified deep veins of unspecified distal lower extremity: Secondary | ICD-10-CM | POA: Diagnosis present

## 2010-12-10 DIAGNOSIS — Z8673 Personal history of transient ischemic attack (TIA), and cerebral infarction without residual deficits: Secondary | ICD-10-CM

## 2010-12-10 DIAGNOSIS — M4804 Spinal stenosis, thoracic region: Secondary | ICD-10-CM | POA: Diagnosis present

## 2010-12-10 DIAGNOSIS — E785 Hyperlipidemia, unspecified: Secondary | ICD-10-CM | POA: Diagnosis present

## 2010-12-10 DIAGNOSIS — I2699 Other pulmonary embolism without acute cor pulmonale: Principal | ICD-10-CM | POA: Diagnosis present

## 2010-12-10 DIAGNOSIS — I824Y9 Acute embolism and thrombosis of unspecified deep veins of unspecified proximal lower extremity: Secondary | ICD-10-CM | POA: Diagnosis present

## 2010-12-10 DIAGNOSIS — Z7982 Long term (current) use of aspirin: Secondary | ICD-10-CM

## 2010-12-10 DIAGNOSIS — Z7901 Long term (current) use of anticoagulants: Secondary | ICD-10-CM

## 2010-12-10 DIAGNOSIS — E119 Type 2 diabetes mellitus without complications: Secondary | ICD-10-CM | POA: Diagnosis present

## 2010-12-10 DIAGNOSIS — D649 Anemia, unspecified: Secondary | ICD-10-CM | POA: Diagnosis present

## 2010-12-10 DIAGNOSIS — Z87828 Personal history of other (healed) physical injury and trauma: Secondary | ICD-10-CM

## 2010-12-10 DIAGNOSIS — H8110 Benign paroxysmal vertigo, unspecified ear: Secondary | ICD-10-CM | POA: Diagnosis present

## 2010-12-10 DIAGNOSIS — I10 Essential (primary) hypertension: Secondary | ICD-10-CM | POA: Diagnosis present

## 2010-12-10 DIAGNOSIS — I214 Non-ST elevation (NSTEMI) myocardial infarction: Secondary | ICD-10-CM

## 2010-12-10 DIAGNOSIS — E875 Hyperkalemia: Secondary | ICD-10-CM | POA: Diagnosis present

## 2010-12-10 DIAGNOSIS — Z96659 Presence of unspecified artificial knee joint: Secondary | ICD-10-CM

## 2010-12-10 LAB — PROTIME-INR
INR: 1.12 (ref 0.00–1.49)
Prothrombin Time: 14.6 seconds (ref 11.6–15.2)

## 2010-12-10 LAB — POCT I-STAT TROPONIN I: Troponin i, poc: 0.3 ng/mL (ref 0.00–0.08)

## 2010-12-10 LAB — DIFFERENTIAL
Lymphocytes Relative: 18 % (ref 12–46)
Lymphs Abs: 1.8 10*3/uL (ref 0.7–4.0)
Neutro Abs: 7.8 10*3/uL — ABNORMAL HIGH (ref 1.7–7.7)
Neutrophils Relative %: 77 % (ref 43–77)

## 2010-12-10 LAB — CARDIAC PANEL(CRET KIN+CKTOT+MB+TROPI)
CK, MB: 3.8 ng/mL (ref 0.3–4.0)
Total CK: 80 U/L (ref 7–177)
Troponin I: 0.81 ng/mL (ref <0.30)

## 2010-12-10 LAB — BASIC METABOLIC PANEL
BUN: 11 mg/dL (ref 6–23)
CO2: 29 mEq/L (ref 19–32)
Chloride: 103 mEq/L (ref 96–112)
Creatinine, Ser: 0.81 mg/dL (ref 0.50–1.10)
Potassium: 3.2 mEq/L — ABNORMAL LOW (ref 3.5–5.1)

## 2010-12-10 LAB — CK TOTAL AND CKMB (NOT AT ARMC)
Relative Index: INVALID (ref 0.0–2.5)
Total CK: 70 U/L (ref 7–177)

## 2010-12-10 LAB — GLUCOSE, CAPILLARY: Glucose-Capillary: 147 mg/dL — ABNORMAL HIGH (ref 70–99)

## 2010-12-10 LAB — CBC
HCT: 38.6 % (ref 36.0–46.0)
MCV: 83 fL (ref 78.0–100.0)
RBC: 4.65 MIL/uL (ref 3.87–5.11)
WBC: 10.1 10*3/uL (ref 4.0–10.5)

## 2010-12-10 LAB — TROPONIN I: Troponin I: 0.48 ng/mL (ref <0.30)

## 2010-12-10 LAB — TSH: TSH: 1.721 u[IU]/mL (ref 0.350–4.500)

## 2010-12-11 ENCOUNTER — Inpatient Hospital Stay (HOSPITAL_COMMUNITY): Payer: Medicare Other

## 2010-12-11 DIAGNOSIS — M79609 Pain in unspecified limb: Secondary | ICD-10-CM

## 2010-12-11 DIAGNOSIS — I749 Embolism and thrombosis of unspecified artery: Secondary | ICD-10-CM

## 2010-12-11 DIAGNOSIS — I517 Cardiomegaly: Secondary | ICD-10-CM

## 2010-12-11 DIAGNOSIS — M549 Dorsalgia, unspecified: Secondary | ICD-10-CM

## 2010-12-11 DIAGNOSIS — I2699 Other pulmonary embolism without acute cor pulmonale: Secondary | ICD-10-CM

## 2010-12-11 LAB — LIPID PANEL
Cholesterol: 196 mg/dL (ref 0–200)
LDL Cholesterol: 130 mg/dL — ABNORMAL HIGH (ref 0–99)
Total CHOL/HDL Ratio: 4.4 RATIO
VLDL: 21 mg/dL (ref 0–40)

## 2010-12-11 LAB — COMPREHENSIVE METABOLIC PANEL
Albumin: 3.1 g/dL — ABNORMAL LOW (ref 3.5–5.2)
Alkaline Phosphatase: 110 U/L (ref 39–117)
BUN: 12 mg/dL (ref 6–23)
Calcium: 9.5 mg/dL (ref 8.4–10.5)
Creatinine, Ser: 0.78 mg/dL (ref 0.50–1.10)
GFR calc Af Amer: >60 mL/min (ref >60)
Glucose, Bld: 173 mg/dL — ABNORMAL HIGH (ref 70–99)
Total Protein: 7 g/dL (ref 6.0–8.3)

## 2010-12-11 LAB — CBC
HCT: 35.8 % — ABNORMAL LOW (ref 36.0–46.0)
Hemoglobin: 11.4 g/dL — ABNORMAL LOW (ref 12.0–15.0)
MCH: 26.5 pg (ref 26.0–34.0)
MCHC: 31.8 g/dL (ref 30.0–36.0)
MCV: 83.3 fL (ref 78.0–100.0)
Platelets: 172 10*3/uL (ref 150–400)
RBC: 4.3 MIL/uL (ref 3.87–5.11)
RDW: 15.4 % (ref 11.5–15.5)
WBC: 10.2 10*3/uL (ref 4.0–10.5)

## 2010-12-11 LAB — HEPARIN LEVEL (UNFRACTIONATED)
Heparin Unfractionated: 0.13 [IU]/mL — ABNORMAL LOW (ref 0.30–0.70)
Heparin Unfractionated: 0.32 [IU]/mL (ref 0.30–0.70)

## 2010-12-11 LAB — GLUCOSE, CAPILLARY
Glucose-Capillary: 160 mg/dL — ABNORMAL HIGH (ref 70–99)
Glucose-Capillary: 174 mg/dL — ABNORMAL HIGH (ref 70–99)

## 2010-12-11 LAB — CARDIAC PANEL(CRET KIN+CKTOT+MB+TROPI)
CK, MB: 3.5 ng/mL (ref 0.3–4.0)
Relative Index: INVALID (ref 0.0–2.5)
Total CK: 80 U/L (ref 7–177)
Troponin I: 0.48 ng/mL

## 2010-12-11 MED ORDER — IOHEXOL 350 MG/ML SOLN
100.0000 mL | Freq: Once | INTRAVENOUS | Status: AC | PRN
  Administered 2010-12-11: 100 mL via INTRAVENOUS

## 2010-12-12 DIAGNOSIS — I2699 Other pulmonary embolism without acute cor pulmonale: Secondary | ICD-10-CM

## 2010-12-12 DIAGNOSIS — R079 Chest pain, unspecified: Secondary | ICD-10-CM

## 2010-12-12 DIAGNOSIS — I749 Embolism and thrombosis of unspecified artery: Secondary | ICD-10-CM

## 2010-12-12 DIAGNOSIS — M549 Dorsalgia, unspecified: Secondary | ICD-10-CM

## 2010-12-12 LAB — CBC
Platelets: 176 10*3/uL (ref 150–400)
RDW: 15.6 % — ABNORMAL HIGH (ref 11.5–15.5)
WBC: 9.3 10*3/uL (ref 4.0–10.5)

## 2010-12-12 LAB — GLUCOSE, CAPILLARY: Glucose-Capillary: 120 mg/dL — ABNORMAL HIGH (ref 70–99)

## 2010-12-12 LAB — PROTIME-INR: INR: 1.03 (ref 0.00–1.49)

## 2010-12-12 LAB — HEPARIN LEVEL (UNFRACTIONATED): Heparin Unfractionated: 0.25 IU/mL — ABNORMAL LOW (ref 0.30–0.70)

## 2010-12-12 LAB — OCCULT BLOOD X 1 CARD TO LAB, STOOL: Fecal Occult Bld: NEGATIVE
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
## 2010-12-13 DIAGNOSIS — I2699 Other pulmonary embolism without acute cor pulmonale: Secondary | ICD-10-CM

## 2010-12-13 LAB — CBC
HCT: 33 % — ABNORMAL LOW (ref 36.0–46.0)
MCH: 26.5 pg (ref 26.0–34.0)
MCHC: 31.8 g/dL (ref 30.0–36.0)
RDW: 15.4 % (ref 11.5–15.5)

## 2010-12-13 LAB — FOLATE: Folate: 11.7 ng/mL

## 2010-12-13 LAB — GLUCOSE, CAPILLARY: Glucose-Capillary: 123 mg/dL — ABNORMAL HIGH (ref 70–99)

## 2010-12-13 LAB — IRON AND TIBC
Iron: 26 ug/dL — ABNORMAL LOW (ref 42–135)
Saturation Ratios: 10 % — ABNORMAL LOW (ref 20–55)
TIBC: 260 ug/dL (ref 250–470)

## 2010-12-13 LAB — PROTIME-INR
INR: 1.51 — ABNORMAL HIGH (ref 0.00–1.49)
Prothrombin Time: 18.5 seconds — ABNORMAL HIGH (ref 11.6–15.2)

## 2010-12-13 LAB — HEPARIN LEVEL (UNFRACTIONATED): Heparin Unfractionated: 0.46 IU/mL (ref 0.30–0.70)

## 2010-12-14 LAB — PROTIME-INR: INR: 2.14 — ABNORMAL HIGH (ref 0.00–1.49)

## 2010-12-14 LAB — GLUCOSE, CAPILLARY
Glucose-Capillary: 135 mg/dL — ABNORMAL HIGH (ref 70–99)
Glucose-Capillary: 151 mg/dL — ABNORMAL HIGH (ref 70–99)
Glucose-Capillary: 139 mg/dL — ABNORMAL HIGH (ref 70–99)

## 2010-12-14 LAB — CBC
HCT: 34.6 % — ABNORMAL LOW (ref 36.0–46.0)
Hemoglobin: 10.8 g/dL — ABNORMAL LOW (ref 12.0–15.0)
MCV: 83.6 fL (ref 78.0–100.0)
RBC: 4.14 MIL/uL (ref 3.87–5.11)
RDW: 15.6 % — ABNORMAL HIGH (ref 11.5–15.5)
WBC: 7.6 10*3/uL (ref 4.0–10.5)

## 2010-12-14 LAB — FACTOR 2 ASSAY: Factor II Activity: 62 % — ABNORMAL LOW (ref 74–131)

## 2010-12-14 LAB — BASIC METABOLIC PANEL
CO2: 30 mEq/L (ref 19–32)
Chloride: 102 mEq/L (ref 96–112)
Potassium: 3.4 mEq/L — ABNORMAL LOW (ref 3.5–5.1)
Sodium: 138 mEq/L (ref 135–145)

## 2010-12-15 LAB — CBC
HCT: 36.4 % (ref 36.0–46.0)
MCHC: 31.9 g/dL (ref 30.0–36.0)
MCV: 83.3 fL (ref 78.0–100.0)
RDW: 15.5 % (ref 11.5–15.5)
WBC: 6.2 10*3/uL (ref 4.0–10.5)

## 2010-12-15 LAB — GLUCOSE, CAPILLARY

## 2010-12-15 LAB — FACTOR 5 LEIDEN

## 2010-12-21 ENCOUNTER — Ambulatory Visit (INDEPENDENT_AMBULATORY_CARE_PROVIDER_SITE_OTHER): Payer: Medicare Other | Admitting: Emergency Medicine

## 2010-12-21 DIAGNOSIS — Z86718 Personal history of other venous thrombosis and embolism: Secondary | ICD-10-CM | POA: Insufficient documentation

## 2010-12-21 DIAGNOSIS — I2699 Other pulmonary embolism without acute cor pulmonale: Secondary | ICD-10-CM

## 2010-12-21 DIAGNOSIS — I82409 Acute embolism and thrombosis of unspecified deep veins of unspecified lower extremity: Secondary | ICD-10-CM

## 2010-12-21 LAB — POCT INR: INR: 3.4
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
## 2010-12-28 ENCOUNTER — Encounter: Payer: Self-pay | Admitting: Cardiovascular Disease

## 2010-12-28 ENCOUNTER — Ambulatory Visit (INDEPENDENT_AMBULATORY_CARE_PROVIDER_SITE_OTHER): Payer: Medicare Other | Admitting: Emergency Medicine

## 2010-12-28 DIAGNOSIS — I2699 Other pulmonary embolism without acute cor pulmonale: Secondary | ICD-10-CM

## 2010-12-28 DIAGNOSIS — I82409 Acute embolism and thrombosis of unspecified deep veins of unspecified lower extremity: Secondary | ICD-10-CM

## 2010-12-30 ENCOUNTER — Ambulatory Visit (INDEPENDENT_AMBULATORY_CARE_PROVIDER_SITE_OTHER): Payer: Medicare Other | Admitting: Cardiovascular Disease

## 2010-12-30 ENCOUNTER — Encounter: Payer: Self-pay | Admitting: Cardiovascular Disease

## 2010-12-30 VITALS — BP 126/66 | HR 51 | Ht 62.0 in | Wt 229.0 lb

## 2010-12-30 DIAGNOSIS — I498 Other specified cardiac arrhythmias: Secondary | ICD-10-CM

## 2010-12-30 DIAGNOSIS — I2699 Other pulmonary embolism without acute cor pulmonale: Secondary | ICD-10-CM

## 2010-12-30 DIAGNOSIS — I82409 Acute embolism and thrombosis of unspecified deep veins of unspecified lower extremity: Secondary | ICD-10-CM

## 2010-12-30 DIAGNOSIS — I10 Essential (primary) hypertension: Secondary | ICD-10-CM

## 2010-12-30 DIAGNOSIS — R001 Bradycardia, unspecified: Secondary | ICD-10-CM

## 2010-12-30 NOTE — Patient Instructions (Addendum)
You are doing well. Please check whether you are on metoprolol  or carvedilol (coreg) You do not need both. If you are on metoprolol,  decrease the metoprolol to 1/2 pill twice a day (25 mg twice a day) If you are on coreg, stay on the same dose Please call us if you have new issues that need to be addressed before your next appt.  We will call you for a follow up Appt. In 6 months   The patient will call with an update for the above medications.
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
## 2010-12-31 NOTE — Assessment & Plan Note (Signed)
She does have a knot and tenderness in the left calf. I have suggested she not massage this area as this could move the residual thrombus.

## 2010-12-31 NOTE — Assessment & Plan Note (Signed)
Blood pressure is well controlled on today's visit. No changes made to the medications. 

## 2010-12-31 NOTE — Progress Notes (Signed)
Patient ID: Kristen [name], female    DOB: 03/05/1941, 70 y.o.   MRN: [id number]  HPI Comments: Kristen [name] is a 70 year old woman with hypertension, hyperlipidemia, diabetes, obesity, CVA in 2006, thoracic spinal stenosis who presented to [facility] after developing leg swelling and pain on the left, with diaphoresis, substernal chest pain, lightheadedness and dizziness. She was diagnosed with a PE, troponin climbed 0.48, started on heparin with bridge to warfarin and presents to establish care in our office.  Echocardiogram showed normal LV systolic function, moderately dilated right ventricle with moderately reduced function.  Recommendation was made for at least 6 months of warfarin with repeat echocardiogram at that time.  Hypercoagulability workup was recommended one month after discontinuation of warfarin  She presents today and reports that he continues to have pain in her left leg with a knot appeared she has shortness of breath with exertion but is able to do mild activities.  Medication list shows discharge medication on Coreg 6.25 mg b.i.d. Previously, she was on metoprolol and she is uncertain which medication she is taking  EKG shows sinus bradycardia with rate 51 beats per minute, nonspecific T-wave abnormality in lead V3 through V6   Outpatient Encounter Prescriptions as of 12/30/2010  Medication Sig Dispense Refill  . amLODipine (NORVASC) 10 MG tablet Take 10 mg by mouth daily.        Marland Kitchen aspirin (ASPIR-81) 81 MG EC tablet Take 81 mg by mouth daily.        . carvedilol (COREG) 6.25 MG tablet Take 6.25 mg by mouth 2 (two) times daily with a meal.        . cyclobenzaprine (FLEXERIL) 10 MG tablet Take 10 mg by mouth 3 (three) times daily as needed.        . diazepam (VALIUM) 5 MG tablet Take 5 mg by mouth every 6 (six) hours as needed.        Marland Kitchen lisinopril (PRINIVIL,ZESTRIL) 20 MG tablet Take 20 mg by mouth daily.        . metoprolol (LOPRESSOR) 50 MG tablet Take 50 mg by mouth  2 (two) times daily.        . risedronate (ACTONEL) 30 MG tablet Take 30 mg by mouth daily. with water on empty stomach, nothing by mouth or lie down for next 30 minutes.       . rivastigmine (EXELON) 9.5 mg/24hr Place 1 patch onto the skin every other day.        . saxagliptin HCl (ONGLYZA) 5 MG TABS tablet Take 5 mg by mouth daily.        . simvastatin (ZOCOR) 20 MG tablet Take 20 mg by mouth at bedtime.        . traMADol (ULTRAM) 50 MG tablet Take 50 mg by mouth every 6 (six) hours as needed.        . warfarin (COUMADIN) 2.5 MG tablet Take 2.5 mg by mouth daily.           Review of Systems  Constitutional: Negative.   HENT: Negative.   Eyes: Negative.   Respiratory: Negative.   Cardiovascular: Negative.   Gastrointestinal: Negative.   Musculoskeletal: Negative.        Left leg pain  Skin: Negative.   Neurological: Negative.   Hematological: Negative.   Psychiatric/Behavioral: Negative.   All other systems reviewed and are negative.    BP 126/66  Pulse 51  Ht 5\' 2"  (1.575 m)  Wt 229 lb (103.874 kg)  BMI  41.88 kg/m2   Physical Exam  Nursing note and vitals reviewed. Constitutional: She is oriented to person, place, and time. She appears well-developed and well-nourished.  HENT:  Head: Normocephalic.  Nose: Nose normal.  Mouth/Throat: Oropharynx is clear and moist.  Eyes: Conjunctivae are normal. Pupils are equal, round, and reactive to light.  Neck: Normal range of motion. Neck supple. No JVD present.  Cardiovascular: Normal rate, regular rhythm, S1 normal, S2 normal, normal heart sounds and intact distal pulses.  Exam reveals no gallop and no friction rub.   No murmur heard. Pulmonary/Chest: Effort normal and breath sounds normal. No respiratory distress. She has no wheezes. She has no rales. She exhibits no tenderness.  Abdominal: Soft. Bowel sounds are normal. She exhibits no distension. There is no tenderness.  Musculoskeletal: Normal range of motion. She exhibits  no edema and no tenderness.       Tenderness of the left calf with gentle palpation.   Lymphadenopathy:    She has no cervical adenopathy.  Neurological: She is alert and oriented to person, place, and time. Coordination normal.  Skin: Skin is warm and dry. No rash noted. No erythema.  Psychiatric: She has a normal mood and affect. Her behavior is normal. Judgment and thought content normal.         Assessment and Plan

## 2010-12-31 NOTE — Assessment & Plan Note (Addendum)
She has asymptomatic bradycardia. We will follow this for now. We'll continue her carvedilol dose at 6.25. We have asked her to confirm that she is not on metoprolol as well. This changed to carvedilol was made while she was admitted.

## 2011-01-04 ENCOUNTER — Ambulatory Visit (INDEPENDENT_AMBULATORY_CARE_PROVIDER_SITE_OTHER): Payer: Medicare Other | Admitting: Emergency Medicine

## 2011-01-04 DIAGNOSIS — I82409 Acute embolism and thrombosis of unspecified deep veins of unspecified lower extremity: Secondary | ICD-10-CM

## 2011-01-04 DIAGNOSIS — I2699 Other pulmonary embolism without acute cor pulmonale: Secondary | ICD-10-CM
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
## 2011-01-18 ENCOUNTER — Encounter: Payer: Medicare Other | Admitting: Emergency Medicine

## 2011-01-24 ENCOUNTER — Other Ambulatory Visit: Payer: Self-pay | Admitting: Cardiology

## 2011-01-24 MED ORDER — WARFARIN SODIUM 2.5 MG PO TABS: ORAL_TABLET | ORAL | Status: DC

## 2011-01-25 ENCOUNTER — Ambulatory Visit (INDEPENDENT_AMBULATORY_CARE_PROVIDER_SITE_OTHER): Payer: Medicare Other | Admitting: Emergency Medicine

## 2011-01-25 ENCOUNTER — Telehealth: Payer: Self-pay

## 2011-01-25 DIAGNOSIS — I2699 Other pulmonary embolism without acute cor pulmonale: Secondary | ICD-10-CM

## 2011-01-25 DIAGNOSIS — I82409 Acute embolism and thrombosis of unspecified deep veins of unspecified lower extremity: Secondary | ICD-10-CM

## 2011-01-25 LAB — POCT INR: INR: 1.3

## 2011-01-25 NOTE — Telephone Encounter (Signed)
Pt was seen in [facility] this am and is inquiring on when she will be cleared to return to driving.  Pt saw Dr Mariah Milling on 12/30/10 and is not scheduled for f/u until 6 months.  Please call and advise pt when she will be able to drive.  Thanks

## 2011-01-26 NOTE — Telephone Encounter (Signed)
I do not see anything about when she could start driving in last note, please advise.

## 2011-01-29 NOTE — Telephone Encounter (Signed)
i don't think we told her that she could not drive

## 2011-01-30 NOTE — Telephone Encounter (Signed)
Notified patient per Dr. Mariah Milling did not know he told you not to drive.

## 2011-02-08 ENCOUNTER — Ambulatory Visit (INDEPENDENT_AMBULATORY_CARE_PROVIDER_SITE_OTHER): Payer: Medicare Other | Admitting: Emergency Medicine

## 2011-02-08 DIAGNOSIS — I2699 Other pulmonary embolism without acute cor pulmonale: Secondary | ICD-10-CM

## 2011-02-08 DIAGNOSIS — I82409 Acute embolism and thrombosis of unspecified deep veins of unspecified lower extremity: Secondary | ICD-10-CM

## 2011-03-01 ENCOUNTER — Ambulatory Visit (INDEPENDENT_AMBULATORY_CARE_PROVIDER_SITE_OTHER): Payer: Medicare Other | Admitting: Emergency Medicine

## 2011-03-01 DIAGNOSIS — I82409 Acute embolism and thrombosis of unspecified deep veins of unspecified lower extremity: Secondary | ICD-10-CM

## 2011-03-01 DIAGNOSIS — I2699 Other pulmonary embolism without acute cor pulmonale: Secondary | ICD-10-CM

## 2011-03-22 ENCOUNTER — Ambulatory Visit (INDEPENDENT_AMBULATORY_CARE_PROVIDER_SITE_OTHER): Payer: Medicare Other | Admitting: Emergency Medicine

## 2011-03-22 DIAGNOSIS — I82409 Acute embolism and thrombosis of unspecified deep veins of unspecified lower extremity: Secondary | ICD-10-CM

## 2011-03-22 DIAGNOSIS — I2699 Other pulmonary embolism without acute cor pulmonale: Secondary | ICD-10-CM

## 2011-04-05 ENCOUNTER — Ambulatory Visit (INDEPENDENT_AMBULATORY_CARE_PROVIDER_SITE_OTHER): Payer: Medicare Other | Admitting: Emergency Medicine

## 2011-04-05 DIAGNOSIS — I82409 Acute embolism and thrombosis of unspecified deep veins of unspecified lower extremity: Secondary | ICD-10-CM

## 2011-04-05 DIAGNOSIS — I2699 Other pulmonary embolism without acute cor pulmonale: Secondary | ICD-10-CM

## 2011-04-05 LAB — POCT INR: INR: 3.3

## 2011-04-19 ENCOUNTER — Ambulatory Visit (INDEPENDENT_AMBULATORY_CARE_PROVIDER_SITE_OTHER): Payer: Medicare Other | Admitting: Emergency Medicine

## 2011-04-19 DIAGNOSIS — I82409 Acute embolism and thrombosis of unspecified deep veins of unspecified lower extremity: Secondary | ICD-10-CM

## 2011-04-19 DIAGNOSIS — I2699 Other pulmonary embolism without acute cor pulmonale: Secondary | ICD-10-CM

## 2011-04-25 HISTORY — PX: CATARACT EXTRACTION: SUR2

## 2011-05-03 ENCOUNTER — Encounter: Payer: Medicare Other | Admitting: Emergency Medicine

## 2011-05-03 ENCOUNTER — Ambulatory Visit (INDEPENDENT_AMBULATORY_CARE_PROVIDER_SITE_OTHER): Payer: Medicare Other | Admitting: Emergency Medicine

## 2011-05-03 DIAGNOSIS — I82409 Acute embolism and thrombosis of unspecified deep veins of unspecified lower extremity: Secondary | ICD-10-CM

## 2011-05-03 DIAGNOSIS — I2699 Other pulmonary embolism without acute cor pulmonale: Secondary | ICD-10-CM

## 2011-05-15 ENCOUNTER — Other Ambulatory Visit: Payer: Self-pay

## 2011-05-15 MED ORDER — WARFARIN SODIUM 2.5 MG PO TABS: ORAL_TABLET | ORAL | Status: DC

## 2011-05-17 ENCOUNTER — Ambulatory Visit (INDEPENDENT_AMBULATORY_CARE_PROVIDER_SITE_OTHER): Payer: Medicare Other | Admitting: Emergency Medicine

## 2011-05-17 DIAGNOSIS — I82409 Acute embolism and thrombosis of unspecified deep veins of unspecified lower extremity: Secondary | ICD-10-CM

## 2011-05-17 DIAGNOSIS — I2699 Other pulmonary embolism without acute cor pulmonale: Secondary | ICD-10-CM

## 2011-05-17 LAB — POCT INR: INR: 2.6

## 2011-06-07 ENCOUNTER — Ambulatory Visit (INDEPENDENT_AMBULATORY_CARE_PROVIDER_SITE_OTHER): Payer: Medicare Other | Admitting: Emergency Medicine

## 2011-06-07 DIAGNOSIS — I82409 Acute embolism and thrombosis of unspecified deep veins of unspecified lower extremity: Secondary | ICD-10-CM

## 2011-06-07 DIAGNOSIS — I2699 Other pulmonary embolism without acute cor pulmonale: Secondary | ICD-10-CM

## 2011-06-07 LAB — POCT INR: INR: 2.4

## 2011-06-10 ENCOUNTER — Other Ambulatory Visit: Payer: Self-pay | Admitting: Cardiovascular Disease

## 2011-06-28 ENCOUNTER — Ambulatory Visit: Payer: Medicare Other | Admitting: Cardiovascular Disease

## 2011-07-05 ENCOUNTER — Ambulatory Visit (INDEPENDENT_AMBULATORY_CARE_PROVIDER_SITE_OTHER): Payer: Medicare Other

## 2011-07-05 ENCOUNTER — Ambulatory Visit (INDEPENDENT_AMBULATORY_CARE_PROVIDER_SITE_OTHER): Payer: Medicare Other | Admitting: Cardiovascular Disease

## 2011-07-05 ENCOUNTER — Encounter: Payer: Self-pay | Admitting: Cardiovascular Disease

## 2011-07-05 VITALS — BP 180/100 | HR 78 | Ht 62.0 in | Wt 225.0 lb

## 2011-07-05 DIAGNOSIS — I82409 Acute embolism and thrombosis of unspecified deep veins of unspecified lower extremity: Secondary | ICD-10-CM

## 2011-07-05 DIAGNOSIS — I10 Essential (primary) hypertension: Secondary | ICD-10-CM

## 2011-07-05 DIAGNOSIS — I2699 Other pulmonary embolism without acute cor pulmonale: Secondary | ICD-10-CM

## 2011-07-05 DIAGNOSIS — I498 Other specified cardiac arrhythmias: Secondary | ICD-10-CM

## 2011-07-05 LAB — POCT INR: INR: 2.1

## 2011-07-05 MED ORDER — LISINOPRIL 20 MG PO TABS: 20.0000 mg | ORAL_TABLET | Freq: Two times a day (BID) | ORAL | Status: DC

## 2011-07-05 NOTE — Assessment & Plan Note (Signed)
She is 6 months out from her DVT left lower extremity and PE requiring hospitalization. We will hold the warfarin as previously recommended and in one month have her followup with hematology for hypercoagulable workup. We have suggested that she wear DVT prophylactic/TED hose.

## 2011-07-05 NOTE — Patient Instructions (Addendum)
You are doing well. Please stop the warfarin Start wearing TED hose  We have scheduled an appt with hematology for Dr. Neale Burly in one month April 15th at 11:00 This is for workup for your DVT/PE  Please monitor your blood pressure and increase the lisinopril to 40 mg a day if it continues to be elevated  Please call us if you have new issues that need to be addressed before your next appt.  Your physician wants you to follow-up in: 2 months.  You will receive a reminder letter in the mail two months in advance. If you don't receive a letter, please call our office to schedule the follow-up appointment.

## 2011-07-05 NOTE — Assessment & Plan Note (Signed)
Blood pressure is elevated today though she reports having an argument with her family member and did not take her blood pressure medication this morning. We have suggested she closely monitor her blood pressure at home and if it continues to be elevated with systolic pressure greater than 140, that she increase her lisinopril to 40 mg daily.

## 2011-07-05 NOTE — Progress Notes (Signed)
Patient ID: Kristen [name], female    DOB: 08-08-1940, 71 y.o.   MRN: [id number]  HPI Comments: Kristen [name] is a 71 year old woman with hypertension, hyperlipidemia, diabetes, obesity, CVA in 2006, thoracic spinal stenosis who presented to [facility] after developing leg swelling and pain on the left, with diaphoresis, substernal chest pain, lightheadedness and dizziness. She was diagnosed with a PE, troponin climbed 0.48, started on heparin with bridge to warfarin and presents  71 routine followup  Echocardiogram showed normal LV systolic function, moderately dilated right ventricle with moderately reduced function.  Recommendation was made for at least 6 months of warfarin. Hypercoagulability workup was recommended one month after discontinuation of warfarin  She presents today and reports that she is doing well with no significant shortness of breath. She feels she is back at baseline. No significant leg pain. Blood pressure is elevated today though she reports having an argument with her son this morning and she did not take her medications. She reports that normally her blood pressure is better but she does not know the numbers .   EKG shows sinus bradycardia with rate 78 beats per minute, nonspecific T-wave abnormality in lead V3 through V6   Outpatient Encounter Prescriptions as of 07/05/2011  Medication Sig Dispense Refill  . amLODipine (NORVASC) 10 MG tablet Take 10 mg by mouth daily.        Marland Kitchen aspirin (ASPIR-81) 81 MG EC tablet Take 81 mg by mouth daily.        . carvedilol (COREG) 6.25 MG tablet Take 6.25 mg by mouth 2 (two) times daily with a meal.        . cholecalciferol (VITAMIN D) 1000 UNITS tablet Take 1,000 Units by mouth daily. Take 2 tablets daily.      . cyclobenzaprine (FLEXERIL) 10 MG tablet Take 10 mg by mouth 3 (three) times daily as needed.        . diazepam (VALIUM) 5 MG tablet Take 5 mg by mouth every 6 (six) hours as needed.        Marland Kitchen lisinopril (PRINIVIL,ZESTRIL)  20 MG tablet Take 1 tablet (20 mg total) by mouth once a  daily.  60 tablet  6  . risedronate (ACTONEL) 30 MG tablet Take 30 mg by mouth daily. with water on empty stomach, nothing by mouth or lie down for next 30 minutes.       . rivastigmine (EXELON) 9.5 mg/24hr Place 1 patch onto the skin every other day.        . saxagliptin HCl (ONGLYZA) 5 MG TABS tablet Take 5 mg by mouth daily.        . simvastatin (ZOCOR) 20 MG tablet Take 20 mg by mouth at bedtime.        . traMADol (ULTRAM) 50 MG tablet Take 50 mg by mouth every 6 (six) hours as needed.        . warfarin (COUMADIN) 2.5 MG tablet Take as directed by the Anticoagulation Clinic.  30 tablet  2  . warfarin (COUMADIN) 2.5 MG tablet TAKE AS DIRECTED BY THE ANTICOAGULATION CLINIC.  30 tablet  3      Review of Systems  Constitutional: Negative.   HENT: Negative.   Eyes: Negative.   Respiratory: Negative.   Cardiovascular: Negative.   Gastrointestinal: Negative.   Musculoskeletal: Negative.        Left leg pain  Skin: Negative.   Neurological: Negative.   Hematological: Negative.   Psychiatric/Behavioral: Negative.   All other systems  reviewed and are negative.    BP 180/100  Pulse 78  Ht 5\' 2"  (1.575 m)  Wt 225 lb (102.059 kg)  BMI 41.15 kg/m2   Physical Exam  Nursing note and vitals reviewed. Constitutional: She is oriented to person, place, and time. She appears well-developed and well-nourished.       Obese  HENT:  Head: Normocephalic.  Nose: Nose normal.  Mouth/Throat: Oropharynx is clear and moist.  Eyes: Conjunctivae are normal. Pupils are equal, round, and reactive to light.  Neck: Normal range of motion. Neck supple. No JVD present.  Cardiovascular: Normal rate, regular rhythm, S1 normal, S2 normal, normal heart sounds and intact distal pulses.  Exam reveals no gallop and no friction rub.   No murmur heard. Pulmonary/Chest: Effort normal and breath sounds normal. No respiratory distress. She has no wheezes.  She has no rales. She exhibits no tenderness.  Abdominal: Soft. Bowel sounds are normal. She exhibits no distension. There is no tenderness.  Musculoskeletal: Normal range of motion. She exhibits no edema and no tenderness.  Lymphadenopathy:    She has no cervical adenopathy.  Neurological: She is alert and oriented to person, place, and time. Coordination normal.  Skin: Skin is warm and dry. No rash noted. No erythema.  Psychiatric: She has a normal mood and affect. Her behavior is normal. Judgment and thought content normal.         Assessment and Plan

## 2011-08-17 ENCOUNTER — Ambulatory Visit: Payer: Self-pay | Admitting: Internal Medicine

## 2011-08-17 LAB — CBC CANCER CENTER
Basophil #: 0 x10 3/mm (ref 0.0–0.1)
Eosinophil #: 0.1 x10 3/mm (ref 0.0–0.7)
Eosinophil %: 1.1 %
Lymphocyte #: 1.5 x10 3/mm (ref 1.0–3.6)
MCH: 27.2 pg (ref 26.0–34.0)
MCV: 86 fL (ref 80–100)
Monocyte #: 0.3 x10 3/mm (ref 0.2–0.9)
Monocyte %: 4.8 %
Neutrophil #: 4.2 x10 3/mm (ref 1.4–6.5)
Platelet: 237 x10 3/mm (ref 150–440)
WBC: 6.1 x10 3/mm (ref 3.6–11.0)

## 2011-08-17 LAB — FERRITIN: Ferritin (ARMC): 68 ng/mL (ref 8–388)

## 2011-08-17 LAB — RETICULOCYTES: Reticulocyte: 0.75 % (ref 0.5–1.5)

## 2011-08-18 LAB — IRON AND TIBC
Iron Bind.Cap.(Total): 292 ug/dL (ref 250–450)
Iron Saturation: 14 %
Iron: 42 ug/dL — ABNORMAL LOW (ref 50–170)

## 2011-08-18 LAB — PROT IMMUNOELECTROPHORES(ARMC)

## 2011-08-23 ENCOUNTER — Ambulatory Visit: Payer: Self-pay | Admitting: Internal Medicine

## 2011-09-04 ENCOUNTER — Ambulatory Visit: Payer: Medicare Other | Admitting: Cardiovascular Disease

## 2011-09-04 ENCOUNTER — Emergency Department: Payer: Self-pay | Admitting: Emergency Medicine

## 2011-09-04 LAB — CREATININE, SERUM
Creatinine: 1.38 mg/dL — ABNORMAL HIGH (ref 0.60–1.30)
EGFR (African American): 45 — ABNORMAL LOW
EGFR (Non-African Amer.): 39 — ABNORMAL LOW

## 2011-09-04 LAB — COMPREHENSIVE METABOLIC PANEL
Albumin: 3.1 g/dL — ABNORMAL LOW (ref 3.4–5.0)
Alkaline Phosphatase: 100 U/L (ref 50–136)
BUN: 27 mg/dL — ABNORMAL HIGH (ref 7–18)
Chloride: 104 mmol/L (ref 98–107)
Creatinine: 1.23 mg/dL (ref 0.60–1.30)
EGFR (African American): 51 — ABNORMAL LOW
EGFR (Non-African Amer.): 44 — ABNORMAL LOW
Osmolality: 284 (ref 275–301)
SGPT (ALT): 22 U/L
Sodium: 140 mmol/L (ref 136–145)
Total Protein: 7.2 g/dL (ref 6.4–8.2)

## 2011-09-04 LAB — CBC CANCER CENTER
Basophil #: 0 x10 3/mm (ref 0.0–0.1)
Basophil %: 0.3 %
Eosinophil #: 0.2 x10 3/mm (ref 0.0–0.7)
HCT: 33.6 % — ABNORMAL LOW (ref 35.0–47.0)
HGB: 10.7 g/dL — ABNORMAL LOW (ref 12.0–16.0)
Lymphocyte #: 1.3 x10 3/mm (ref 1.0–3.6)
Lymphocyte %: 23.2 %
MCH: 27.2 pg (ref 26.0–34.0)
MCHC: 31.8 g/dL — ABNORMAL LOW (ref 32.0–36.0)
MCV: 86 fL (ref 80–100)
Monocyte #: 0.3 x10 3/mm (ref 0.2–0.9)
Monocyte %: 4.7 %
Neutrophil %: 69 %
Platelet: 210 x10 3/mm (ref 150–440)
RBC: 3.93 10*6/uL (ref 3.80–5.20)
WBC: 5.8 x10 3/mm (ref 3.6–11.0)

## 2011-09-04 LAB — CBC WITH DIFFERENTIAL/PLATELET
Basophil %: 0.4 %
Eosinophil #: 0.2 10*3/uL (ref 0.0–0.7)
Eosinophil %: 2.4 %
HCT: 32.3 % — ABNORMAL LOW (ref 35.0–47.0)
HGB: 10.5 g/dL — ABNORMAL LOW (ref 12.0–16.0)
Lymphocyte %: 26.1 %
MCH: 27.7 pg (ref 26.0–34.0)
MCHC: 32.5 g/dL (ref 32.0–36.0)
MCV: 85 fL (ref 80–100)
Monocyte #: 0.3 x10 3/mm (ref 0.2–0.9)
Neutrophil #: 4.1 10*3/uL (ref 1.4–6.5)
Neutrophil %: 66.4 %
RBC: 3.8 10*6/uL (ref 3.80–5.20)
RDW: 15.3 % — ABNORMAL HIGH (ref 11.5–14.5)
WBC: 6.2 10*3/uL (ref 3.6–11.0)

## 2011-09-04 LAB — TSH: Thyroid Stimulating Horm: 4.72 u[IU]/mL — ABNORMAL HIGH

## 2011-09-23 ENCOUNTER — Ambulatory Visit: Payer: Self-pay | Admitting: Internal Medicine

## 2011-09-23 ENCOUNTER — Ambulatory Visit: Payer: Self-pay

## 2013-02-18 ENCOUNTER — Ambulatory Visit (INDEPENDENT_AMBULATORY_CARE_PROVIDER_SITE_OTHER): Payer: Medicare Other | Admitting: Family Medicine

## 2013-02-18 ENCOUNTER — Encounter: Payer: Self-pay | Admitting: Family Medicine

## 2013-02-18 VITALS — BP 144/78 | HR 80 | Temp 98.2°F | Ht 62.0 in | Wt 218.5 lb

## 2013-02-18 DIAGNOSIS — Z86711 Personal history of pulmonary embolism: Secondary | ICD-10-CM

## 2013-02-18 DIAGNOSIS — E1165 Type 2 diabetes mellitus with hyperglycemia: Secondary | ICD-10-CM | POA: Insufficient documentation

## 2013-02-18 DIAGNOSIS — I10 Essential (primary) hypertension: Secondary | ICD-10-CM

## 2013-02-18 DIAGNOSIS — E669 Obesity, unspecified: Secondary | ICD-10-CM

## 2013-02-18 DIAGNOSIS — E119 Type 2 diabetes mellitus without complications: Secondary | ICD-10-CM

## 2013-02-18 DIAGNOSIS — M1990 Unspecified osteoarthritis, unspecified site: Secondary | ICD-10-CM | POA: Insufficient documentation

## 2013-02-18 DIAGNOSIS — E66811 Obesity, class 1: Secondary | ICD-10-CM | POA: Insufficient documentation

## 2013-02-18 DIAGNOSIS — E785 Hyperlipidemia, unspecified: Secondary | ICD-10-CM | POA: Insufficient documentation

## 2013-02-18 LAB — HEMOGLOBIN A1C: Hgb A1c MFr Bld: 7.9 % — ABNORMAL HIGH (ref 4.6–6.5)

## 2013-02-18 LAB — MICROALBUMIN / CREATININE URINE RATIO
Creatinine,U: 181 mg/dL
Microalb, Ur: 2 mg/dL — ABNORMAL HIGH (ref 0.0–1.9)

## 2013-02-18 MED ORDER — TRAMADOL HCL 50 MG PO TABS: 25.0000 mg | ORAL_TABLET | Freq: Two times a day (BID) | ORAL | Status: DC | PRN

## 2013-02-18 NOTE — Assessment & Plan Note (Signed)
Chronic, stable. Continue hyzaar. 

## 2013-02-18 NOTE — Assessment & Plan Note (Addendum)
Body mass index is 39.95 kg/(m^2).  Will continue to monitor.  Will need to address knee pain prior to increasing activity.

## 2013-02-18 NOTE — Assessment & Plan Note (Signed)
Await blood work today.  Continue onglyza.

## 2013-02-18 NOTE — Progress Notes (Signed)
Subjective:    Patient ID: Kristen [name], female    DOB: 23-Mar-1941, 72 y.o.   MRN: [id number]  HPI CC: new pt to establish  Prior saw Dr. Lacie Scotts.  H/o R TKR 8-9 yrs ago.  No pain.  Prior orthopedist in [place].  Would like to establish locally. Now L knee bothering her.  Planning on surgery in the spring.  No L knee xray.  Trouble since PE 2012.   Taking tylenol for pain.    H/o PE - 2012, completed 6 months of coumadin.  Continues aspirin daily. Saw hematology Dr. Neale Burly.  Workup not available. HTN - compliant with hyzaar. HLD - simvastatin.  No myalgias. DM - on onglyza daily.  Does not check sugars.  Unsure last A1c.  Last eye exam - around 03/2012 for cataracts.  No low sugar sxs. No results found for this basename: HGBA1C   Lives alone 4 grown children Occupation: retired, was Higher education careers adviser Activity: Does not regularly exercise. Diet: good water, fruits/vegetables seldom  Preventative: Last CPE was around last year. S/p colonoscopy "a while ago" S/p hysterectomy for fibroids. Flu shot - declines  Medications and allergies reviewed and updated in chart.  Past histories reviewed and updated if relevant as below. Patient Active Problem List   Diagnosis Date Noted  . Pulmonary embolism 12/21/2010  . DVT of leg (deep venous thrombosis) 12/21/2010  . HYPERTENSION, BENIGN 05/13/2009  . BRADYCARDIA 05/13/2009   Past Medical History  Diagnosis Date  . HTN (hypertension)   . Diabetes mellitus   . Hypercholesterolemia   . Morbid obesity   . Vertigo     hx. benign postitional postural  . Internal capsule hemorrhage     hx of sublacunar infarct involving the right posterior limb of the internal capsule   . Arthritis     knees  . Frequent headaches   . History of pulmonary embolism 2012  . MI (myocardial infarction)    Past Surgical History  Procedure Laterality Date  . Total knee arthroplasty Right   . Partial hysterectomy      for fibroids, ovaries  remain  . Foot surgery Right   . Cataract extraction Bilateral 2013   History  Substance Use Topics  . Smoking status: Never Smoker   . Smokeless tobacco: Never Used     Comment: tobacco use- no   . Alcohol Use: Yes     Comment: Rare   Family History  Problem Relation Age of Onset  . Cancer Mother     bone  . Diabetes Father   . Cancer Son 31    lung  . Congenital heart disease Son 13  . Hypertension Father    Allergies  Allergen Reactions  . Penicillins Rash    Whelps and swelling   Current Outpatient Prescriptions on File Prior to Visit  Medication Sig Dispense Refill  . aspirin (ASPIR-81) 81 MG EC tablet Take 81 mg by mouth daily.        . saxagliptin HCl (ONGLYZA) 5 MG TABS tablet Take 5 mg by mouth daily.        . simvastatin (ZOCOR) 20 MG tablet Take 20 mg by mouth at bedtime.         No current facility-administered medications on file prior to visit.     Review of Systems  Constitutional: Negative for fever, chills, activity change, appetite change, fatigue and unexpected weight change.  HENT: Negative for hearing loss.   Eyes: Negative for visual disturbance.  Respiratory: Negative for cough, chest tightness, shortness of breath and wheezing.   Cardiovascular: Negative for chest pain, palpitations and leg swelling.  Gastrointestinal: Negative for nausea, vomiting, abdominal pain, diarrhea, constipation, blood in stool and abdominal distention.  Genitourinary: Negative for hematuria and difficulty urinating.  Musculoskeletal: Positive for arthralgias (L knee). Negative for myalgias and neck pain.  Skin: Negative for rash.  Neurological: Negative for dizziness, seizures, syncope and headaches.  Hematological: Negative for adenopathy. Does not bruise/bleed easily.  Psychiatric/Behavioral: Negative for dysphoric mood. The patient is not nervous/anxious.        Objective:   Physical Exam  Nursing note and vitals reviewed. Constitutional: She is oriented to  person, place, and time. She appears well-developed and well-nourished. No distress.  HENT:  Head: Normocephalic and atraumatic.  Right Ear: Hearing, tympanic membrane, external ear and ear canal normal.  Left Ear: Hearing, tympanic membrane, external ear and ear canal normal.  Nose: Nose normal.  Mouth/Throat: Oropharynx is clear and moist. No oropharyngeal exudate.  Eyes: Conjunctivae and EOM are normal. Pupils are equal, round, and reactive to light. No scleral icterus.  Neck: Normal range of motion. Neck supple. No thyromegaly present.  Cardiovascular: Normal rate, regular rhythm, normal heart sounds and intact distal pulses.   No murmur heard. Pulses:      Radial pulses are 2+ on the right side, and 2+ on the left side.  Pulmonary/Chest: Effort normal and breath sounds normal. No respiratory distress. She has no wheezes. She has no rales.  Musculoskeletal: Normal range of motion. She exhibits no edema.  Lymphadenopathy:    She has no cervical adenopathy.  Neurological: She is alert and oriented to person, place, and time.  CN grossly intact, station and gait intact  Skin: Skin is warm and dry. No rash noted.  Psychiatric: She has a normal mood and affect. Her behavior is normal. Judgment and thought content normal.       Assessment & Plan:

## 2013-02-18 NOTE — Patient Instructions (Signed)
Blood work today. For knees - Try tramadol 1/2 to 1 tablet as needed for pain.  May also take tylenol. I will request records from Dr. Glenis Smoker and Dr. Neale Burly. Good to see you today, call us with quesitons.

## 2013-02-18 NOTE — Assessment & Plan Note (Signed)
Await A1c prior to discussion of steroid shot - will provide with tramadol script - discussed sedation precautions.

## 2013-02-18 NOTE — Assessment & Plan Note (Signed)
Await records from Dr. Neale Burly (requested today). Completed 6 mo coumadin.

## 2013-02-18 NOTE — Assessment & Plan Note (Deleted)
Await records from Dr. Gitten (requested today). Completed 6 mo coumadin. 

## 2013-02-18 NOTE — Assessment & Plan Note (Signed)
Continue statin.  Check FLP when next fasting blood work.

## 2013-02-22 ENCOUNTER — Other Ambulatory Visit: Payer: Self-pay | Admitting: Family Medicine

## 2013-02-22 MED ORDER — GLIMEPIRIDE 1 MG PO TABS: 0.5000 mg | ORAL_TABLET | Freq: Every day | ORAL | Status: DC

## 2013-02-22 MED ORDER — GLIMEPIRIDE 1 MG PO TABS: 1.0000 mg | ORAL_TABLET | Freq: Every day | ORAL | Status: DC

## 2013-05-20 ENCOUNTER — Ambulatory Visit: Payer: Medicare Other | Admitting: Family Medicine

## 2013-05-26 ENCOUNTER — Ambulatory Visit (INDEPENDENT_AMBULATORY_CARE_PROVIDER_SITE_OTHER): Payer: Medicare Other | Admitting: Family Medicine

## 2013-05-26 ENCOUNTER — Encounter: Payer: Self-pay | Admitting: Family Medicine

## 2013-05-26 VITALS — BP 128/82 | HR 80 | Temp 98.0°F | Wt 223.2 lb

## 2013-05-26 DIAGNOSIS — I10 Essential (primary) hypertension: Secondary | ICD-10-CM

## 2013-05-26 DIAGNOSIS — R21 Rash and other nonspecific skin eruption: Secondary | ICD-10-CM

## 2013-05-26 DIAGNOSIS — H8110 Benign paroxysmal vertigo, unspecified ear: Secondary | ICD-10-CM

## 2013-05-26 DIAGNOSIS — E119 Type 2 diabetes mellitus without complications: Secondary | ICD-10-CM

## 2013-05-26 DIAGNOSIS — L659 Nonscarring hair loss, unspecified: Secondary | ICD-10-CM

## 2013-05-26 LAB — BASIC METABOLIC PANEL
BUN: 17 mg/dL (ref 6–23)
CO2: 30 mEq/L (ref 19–32)
Calcium: 9 mg/dL (ref 8.4–10.5)
Chloride: 102 mEq/L (ref 96–112)
Creatinine, Ser: 1.1 mg/dL (ref 0.4–1.2)
GFR: 62.17 mL/min (ref >60.00)
GLUCOSE: 187 mg/dL — AB (ref 70–99)
POTASSIUM: 3.5 meq/L (ref 3.5–5.1)
Sodium: 138 mEq/L (ref 135–145)

## 2013-05-26 LAB — HEMOGLOBIN A1C: Hgb A1c MFr Bld: 8.3 % — ABNORMAL HIGH (ref 4.6–6.5)

## 2013-05-26 MED ORDER — CLOTRIMAZOLE 1 % EX CREA: 1.0000 "application " | TOPICAL_CREAM | Freq: Two times a day (BID) | CUTANEOUS | Status: DC

## 2013-05-26 MED ORDER — METFORMIN HCL 500 MG PO TABS: 500.0000 mg | ORAL_TABLET | Freq: Every day | ORAL | Status: DC

## 2013-05-26 NOTE — Assessment & Plan Note (Signed)
Chronic, stable. Continue med. 

## 2013-05-26 NOTE — Assessment & Plan Note (Signed)
Describes BPPV to right - provided with epley maneuver to do at home.

## 2013-05-26 NOTE — Assessment & Plan Note (Signed)
Suggested trial of biotin.

## 2013-05-26 NOTE — Progress Notes (Signed)
   Subjective:    Patient ID: Kristen [name], female    DOB: [birth year], 73 y.o.   MRN: [id number]  HPI CC: 3 mo f/u  [name] presents today for 3 mo f/u  Notices hair falling out - ongoing for last several months.  No bald spots.  Notices this new rash under arms and in groin and under breasts - darker color of skin as well as very itchy.  No breaks in skin. Lab Results  Component Value Date   TSH 1.721 12/10/2010    H/o PE - 2012, completed 6 months of coumadin. Continues aspirin daily. Saw hematology Dr. Cynda Acres. Workup not available.   HLD - simvastatin. No myalgias  DM - regularly does not check sugars.  Compliant with antihyperglycemic regimen which includes: amaryl 1mg  daily.  Stopped onglyza because it was too expensive at end of year.  Denies hypoglycemic symptoms.  Denies paresthesias. Last diabetic eye exam 03/2012.  Pneumovax: declines.  Flu shot - declines.  Metformin may have caused GI distress. Lab Results  Component Value Date   HGBA1C 7.9* 02/18/2013   Lab Results  Component Value Date   CREATININE 0.90 12/14/2010   HTN - Compliant with current antihypertensive regimen of hyzaar 100/12.5.  Does not check blood pressures at home.  No low blood pressure symptoms of dizziness/syncope.  Denies HA, vision changes, CP/tightness, SOB, leg swelling.  H/o inner ear trouble - wonders if this is returning.  Notices when she lays on left side has some vertigo sensation.  Past Medical History  Diagnosis Date  . HTN (hypertension)   . Diabetes type 2, controlled   . HLD (hyperlipidemia)   . Obesity   . Vertigo     hx. benign postitional postural  . Internal capsule hemorrhage     hx of sublacunar infarct involving the right posterior limb of the internal capsule   . Osteoarthritis     knees  . Frequent headaches   . History of pulmonary embolism 2012  . MI (myocardial infarction)      Review of Systems Per HPI    Objective:   Physical Exam  Nursing note and  vitals reviewed. Constitutional: She appears well-developed and well-nourished. No distress.  HENT:  Head: Normocephalic and atraumatic.  Right Ear: External ear normal.  Left Ear: External ear normal.  Nose: Nose normal.  Mouth/Throat: Oropharynx is clear and moist. No oropharyngeal exudate.  Eyes: Conjunctivae and EOM are normal. Pupils are equal, round, and reactive to light. No scleral icterus.  Neck: Normal range of motion. Neck supple.  Cardiovascular: Normal rate, regular rhythm, normal heart sounds and intact distal pulses.   No murmur heard. Pulmonary/Chest: Effort normal and breath sounds normal. No respiratory distress. She has no wheezes. She has no rales.  Musculoskeletal: She exhibits edema (tr pedal edema).  Diabetic foot exam: Normal inspection No skin breakdown Early callus left great toe Normal DP/PT pulses Normal sensation to light touch and monofilament Nails normal  Lymphadenopathy:    She has no cervical adenopathy.  Skin: Skin is warm and dry. Rash noted.  Slight hyperpigmented macular rash bilateral axilla and some under skin  Psychiatric: She has a normal mood and affect.       Assessment & Plan:

## 2013-05-26 NOTE — Assessment & Plan Note (Signed)
Anticipate mild case of intertrigo - treat with clotrimazole cream bid for 3-4 wks and discussed importance of keeping area clean and dry.  rec talc powder as drying agent.

## 2013-05-26 NOTE — Progress Notes (Signed)
Pre-visit discussion using our clinic review tool. No additional management support is needed unless otherwise documented below in the visit note.  

## 2013-05-26 NOTE — Assessment & Plan Note (Signed)
Continue amaryl, unable to afford onglyza, will start metformin. Discussed likely GI side effects and to notify me if unable to tolerate med. Pt declines pneumovax today.

## 2013-05-26 NOTE — Patient Instructions (Addendum)
Do exercises provided today for some vertigo likely due to inner ear. For skin rash - take clotrimazole twice daily for 3-4 weeks and use talc powder to keep skin dry. For diabetes -let's try metformin 500mg  nightly for 1 week then may increase to 500mg  twice daily.  Expect some GI upset initially which should get better.  If persistent GI discomfort then call me and we will change medicines again.  We will stop onglyza for now. Try biotin for hair and nail health. Return to see me in 83months Blood work today.

## 2013-05-27 ENCOUNTER — Telehealth: Payer: Self-pay | Admitting: Family Medicine

## 2013-05-27 NOTE — Telephone Encounter (Signed)
Relevant patient education mailed to patient.  

## 2013-07-02 ENCOUNTER — Observation Stay: Payer: Self-pay | Admitting: Internal Medicine

## 2013-07-02 ENCOUNTER — Other Ambulatory Visit: Payer: Self-pay | Admitting: *Deleted

## 2013-07-02 DIAGNOSIS — R079 Chest pain, unspecified: Secondary | ICD-10-CM

## 2013-07-02 LAB — BASIC METABOLIC PANEL
Anion Gap: 9 (ref 7–16)
BUN: 16 mg/dL (ref 7–18)
CREATININE: 0.97 mg/dL (ref 0.60–1.30)
Calcium, Total: 9.2 mg/dL (ref 8.5–10.1)
Chloride: 102 mmol/L (ref 98–107)
Co2: 29 mmol/L (ref 21–32)
EGFR (African American): >60
EGFR (Non-African Amer.): 59 — ABNORMAL LOW
GLUCOSE: 127 mg/dL — AB (ref 65–99)
Osmolality: 282 (ref 275–301)
POTASSIUM: 3.1 mmol/L — AB (ref 3.5–5.1)
Sodium: 140 mmol/L (ref 136–145)

## 2013-07-02 LAB — CBC
HCT: 36.7 % (ref 35.0–47.0)
HGB: 12.1 g/dL (ref 12.0–16.0)
MCH: 27.7 pg (ref 26.0–34.0)
MCHC: 32.9 g/dL (ref 32.0–36.0)
MCV: 84 fL (ref 80–100)
Platelet: 214 10*3/uL (ref 150–440)
RBC: 4.35 10*6/uL (ref 3.80–5.20)
RDW: 16 % — ABNORMAL HIGH (ref 11.5–14.5)
WBC: 7.8 10*3/uL (ref 3.6–11.0)

## 2013-07-02 LAB — TROPONIN I: TROPONIN-I: 0.04 ng/mL

## 2013-07-02 LAB — PROTIME-INR
INR: 1.1
Prothrombin Time: 13.7 secs (ref 11.5–14.7)

## 2013-07-02 LAB — CK TOTAL AND CKMB (NOT AT ARMC)
CK, TOTAL: 88 U/L
CK, TOTAL: 89 U/L
CK, Total: 98 U/L
CK-MB: 0.8 ng/mL (ref 0.5–3.6)
CK-MB: 1.1 ng/mL (ref 0.5–3.6)
CK-MB: 1.1 ng/mL (ref 0.5–3.6)

## 2013-07-02 LAB — MAGNESIUM: Magnesium: 1.5 mg/dL — ABNORMAL LOW

## 2013-07-02 MED ORDER — METFORMIN HCL 500 MG PO TABS: 500.0000 mg | ORAL_TABLET | Freq: Two times a day (BID) | ORAL | Status: DC

## 2013-07-03 DIAGNOSIS — E876 Hypokalemia: Secondary | ICD-10-CM

## 2013-07-03 DIAGNOSIS — I10 Essential (primary) hypertension: Secondary | ICD-10-CM

## 2013-07-03 DIAGNOSIS — E785 Hyperlipidemia, unspecified: Secondary | ICD-10-CM

## 2013-07-03 DIAGNOSIS — I059 Rheumatic mitral valve disease, unspecified: Secondary | ICD-10-CM

## 2013-07-03 LAB — POTASSIUM: POTASSIUM: 3.4 mmol/L — AB (ref 3.5–5.1)

## 2013-07-03 LAB — LIPID PANEL
Cholesterol: 138 mg/dL (ref 0–200)
HDL Cholesterol: 41 mg/dL (ref 40–60)
Ldl Cholesterol, Calc: 82 mg/dL (ref 0–100)
Triglycerides: 77 mg/dL (ref 0–200)
VLDL Cholesterol, Calc: 15 mg/dL (ref 5–40)

## 2013-07-03 LAB — MAGNESIUM: Magnesium: 1.6 mg/dL — ABNORMAL LOW

## 2013-07-04 ENCOUNTER — Telehealth: Payer: Self-pay

## 2013-07-04 NOTE — Telephone Encounter (Signed)
Patient contacted regarding discharge from [facility] on 07/03/13.  Patient understands to follow up with Dr. Rockey Situ on 07/18/13 at 9:45 at [facility]. Patient understands discharge instructions? yes Patient understands medications and regiment? yes Patient understands to bring all medications to this visit? yes  Pt states that she is in the bed and does not want to get up at this time to get her med list from the other room so that we may review it. She reports that none of her meds were changed. Advised her to call back when she has had a chance to look over her list.

## 2013-07-07 ENCOUNTER — Telehealth: Payer: Self-pay

## 2013-07-07 NOTE — Telephone Encounter (Signed)
Attempted to contact pt regarding discharge from [facility] on 07/03/13. No answer, no voicemail.

## 2013-07-08 NOTE — Telephone Encounter (Signed)
2nd attempt to contact pt regarding discharge from [facility] on 07/03/13. No answer, no voicemail.

## 2013-07-09 NOTE — Telephone Encounter (Signed)
Patient contacted regarding discharge from [facility]  on 07/03/13.  Patient understands to follow up with Dr. Rockey Situ on 07/18/13 at 9:45 at [facility]. Patient understands discharge instructions? yes Patient understands medications and regiment? yes Patient understands to bring all medications to this visit? yes  Pt was occupied and unable to review med list, but states that she feels cold after she takes her meds. She states that it is not bothersome, she drinks some hot tea and feels better. Asked pt to call back to go over med list when she has a free moment, but she states that there was no change made while she was hospitalized.

## 2013-07-17 ENCOUNTER — Ambulatory Visit (INDEPENDENT_AMBULATORY_CARE_PROVIDER_SITE_OTHER): Payer: Medicare Other | Admitting: Family Medicine

## 2013-07-17 ENCOUNTER — Encounter: Payer: Self-pay | Admitting: Family Medicine

## 2013-07-17 VITALS — BP 144/82 | HR 63 | Temp 97.9°F | Wt 216.2 lb

## 2013-07-17 DIAGNOSIS — E1165 Type 2 diabetes mellitus with hyperglycemia: Secondary | ICD-10-CM

## 2013-07-17 DIAGNOSIS — I10 Essential (primary) hypertension: Secondary | ICD-10-CM

## 2013-07-17 DIAGNOSIS — IMO0001 Reserved for inherently not codable concepts without codable children: Secondary | ICD-10-CM

## 2013-07-17 DIAGNOSIS — R079 Chest pain, unspecified: Secondary | ICD-10-CM | POA: Insufficient documentation

## 2013-07-17 DIAGNOSIS — IMO0002 Reserved for concepts with insufficient information to code with codable children: Secondary | ICD-10-CM

## 2013-07-17 MED ORDER — ONETOUCH ULTRA SYSTEM W/DEVICE KIT: 1.0000 | PACK | Freq: Once | Status: DC

## 2013-07-17 MED ORDER — GLIMEPIRIDE 2 MG PO TABS: 2.0000 mg | ORAL_TABLET | Freq: Every day | ORAL | Status: DC

## 2013-07-17 MED ORDER — GLUCOSE BLOOD VI STRP: ORAL_STRIP | Status: DC

## 2013-07-17 NOTE — Progress Notes (Addendum)
BP 144/82  Pulse 63  Temp(Src) 97.9 F (36.6 C) (Oral)  Wt 216 lb 4 oz (98.09 kg)  SpO2 98%   CC: hospital follow up  Subjective:    Patient ID: Kristen [name], female    DOB: 1941/02/21, 73 y.o.   MRN: [id number]  HPI: Kristen [name] is a 73 y.o. female presenting on 07/17/2013 for Hospitalization Follow-up   Kristen [name] presents today as a hospital follow up visit.  I do not have D/C summary available but have requested it.  She does bring paperwork from hospital stay which I reviewed.  Admitted 3/11-03/2014 with chest pain and leg swelling.  She was worried about infection in right leg and thinks anxiety led to chest pain. She has appointment tomorrow with Dr. Rockey Situ for transitional care visit.  CT angio with contrast - no PE, ?bronchitis, heart size upper normal, multilevel DDD especially at T5-6 resulting in severe central canal narrowing Nuclear stress test - ?done, no records.  Pt endorses done but I don't know results [place] RLE - no DVT CXR - borderline CM Pt states had echo of heart but I don't have records of this either.  She was started on lasix 20mg  x 5 days and magnesium oxide 400mg  x 7days.  She was also provided with a Rx for SL nitro. LDL - 82, K low at 3.4, Mg low at 1.6, D dimer elevated at 2s.  Since discharge, no chest pain, dyspnea, improved leg swelling but still mildly present.  She completed her lasix course.  DM - not tolerating metformin 2/2 chills and dizziness.  Unable to afford onglyza.  Compliant with amaryl 1mg  daily.  Doesn't know how to check sugars. Lab Results  Component Value Date   HGBA1C 8.3* 05/26/2013     Relevant past medical, surgical, family and social history reviewed and updated as indicated.  Allergies and medications reviewed and updated. Current Outpatient Prescriptions on File Prior to Visit  Medication Sig  . aspirin (ASPIR-81) 81 MG EC tablet Take 81 mg by mouth daily.    . clotrimazole (LOTRIMIN) 1 % cream Apply 1 application  topically 2 (two) times daily.  Marland Kitchen losartan-hydrochlorothiazide (HYZAAR) 100-12.5 MG per tablet Take 1 tablet by mouth daily.  . simvastatin (ZOCOR) 20 MG tablet Take 20 mg by mouth at bedtime.    . traMADol (ULTRAM) 50 MG tablet Take 0.5-1 tablets (25-50 mg total) by mouth 2 (two) times daily as needed for pain.   No current facility-administered medications on file prior to visit.    Review of Systems Per HPI unless specifically indicated above    Objective:    BP 144/82  Pulse 63  Temp(Src) 97.9 F (36.6 C) (Oral)  Wt 216 lb 4 oz (98.09 kg)  SpO2 98%  Physical Exam  Nursing note and vitals reviewed. Constitutional: She appears well-developed and well-nourished. No distress.  HENT:  Mouth/Throat: Oropharynx is clear and moist. No oropharyngeal exudate.  Neck: No hepatojugular reflux and no JVD present.  Cardiovascular: Normal rate, regular rhythm, normal heart sounds and intact distal pulses.   No murmur heard. Pulmonary/Chest: Effort normal and breath sounds normal. No respiratory distress. She has no wheezes. She has no rales.  Musculoskeletal: She exhibits edema (tr on right).  Lymphadenopathy:    She has no cervical adenopathy.   Results for orders placed in visit on 09/32/35  BASIC METABOLIC PANEL      Result Value Ref Range   Sodium 138  135 - 145 mEq/L  Potassium 3.5  3.5 - 5.1 mEq/L   Chloride 102  96 - 112 mEq/L   CO2 30  19 - 32 mEq/L   Glucose, Bld 187 (*) 70 - 99 mg/dL   BUN 17  6 - 23 mg/dL   Creatinine, Ser 1.1  0.4 - 1.2 mg/dL   Calcium 9.0  8.4 - 10.5 mg/dL   GFR 62.17  >60.00 mL/min  HEMOGLOBIN A1C      Result Value Ref Range   Hemoglobin A1C 8.3 (*) 4.6 - 6.5 %      Assessment & Plan:   Problem List Items Addressed This Visit   Chest pain     Recent hospitalization for chest pain with leg swelling and I have requested D/C summary records.   Eval revealing CM. Pt states had echo done in hospital as well as stress test and I will review these  when I receive records. Advised keep appt with cards tomorrow.  Sounds like ischemic workup was negative.    Diabetes type 2, uncontrolled - Primary     Chronic. Did not tolerate metformin, could not afford onglyza. Will increase amaryl to 2mg  daily and will reassess next visit in 6 wks. Sent glucometer with strips to pharmacy and advised her to ask pharmacist or return here for testing education with Benjie Karvonen.    Relevant Medications      glimepiride (AMARYL) tablet   HYPERTENSION, BENIGN      bp slightly above goal today but previously well controlled. No changes today. Wt Readings from Last 3 Encounters:  07/17/13 216 lb 4 oz (98.09 kg)  05/26/13 223 lb 4 oz (101.266 kg)  02/18/13 218 lb 8 oz (99.111 kg)          Follow up plan: Return for keep prior appointment.    ADDENDUM==> received,reviewed D/C summary from [facility]: Atypical chest pain with essentially normal stress myoview with EF 61%.   Echo showing EF 55-60%, normal RV sys fxn and size, nl LV sys fxn and size. CE neg x3 EKG - old anterolateral T wave inversions

## 2013-07-17 NOTE — Assessment & Plan Note (Signed)
Chronic. Did not tolerate metformin, could not afford onglyza. Will increase amaryl to 2mg  daily and will reassess next visit in 6 wks. Sent glucometer with strips to pharmacy and advised her to ask pharmacist or return here for testing education with Benjie Karvonen.

## 2013-07-17 NOTE — Assessment & Plan Note (Signed)
bp slightly above goal today but previously well controlled. No changes today. Wt Readings from Last 3 Encounters:  07/17/13 216 lb 4 oz (98.09 kg)  05/26/13 223 lb 4 oz (101.266 kg)  02/18/13 218 lb 8 oz (99.111 kg)

## 2013-07-17 NOTE — Patient Instructions (Addendum)
Let's stop metformin. Increase glimepiride to two 1mg  pills daily (2mg  total).  New dose at pharmacy will be 2mg  so you will only have to take 1 pill daily. Keep your appointment in May with me - we will check blood work then.  Try to bring 1 week of log of sugars to next appointment.  I've sent glucose meter to your pharmacy.  Check with pharmacist on how to use it, or return here for appointment with Benjie Karvonen. Keep appointment with Dr. Rockey Situ tomorrow.

## 2013-07-17 NOTE — Progress Notes (Signed)
Pre visit review using our clinic review tool, if applicable. No additional management support is needed unless otherwise documented below in the visit note. 

## 2013-07-17 NOTE — Assessment & Plan Note (Signed)
Recent hospitalization for chest pain with leg swelling and I have requested D/C summary records.   Eval revealing CM. Pt states had echo done in hospital as well as stress test and I will review these when I receive records. Advised keep appt with cards tomorrow.  Sounds like ischemic workup was negative.

## 2013-07-18 ENCOUNTER — Encounter: Payer: Self-pay | Admitting: Cardiovascular Disease

## 2013-07-18 ENCOUNTER — Telehealth: Payer: Self-pay

## 2013-07-18 ENCOUNTER — Ambulatory Visit (INDEPENDENT_AMBULATORY_CARE_PROVIDER_SITE_OTHER): Payer: Medicare Other | Admitting: Cardiovascular Disease

## 2013-07-18 VITALS — BP 140/90 | HR 69 | Ht 62.0 in | Wt 225.0 lb

## 2013-07-18 DIAGNOSIS — IMO0002 Reserved for concepts with insufficient information to code with codable children: Secondary | ICD-10-CM

## 2013-07-18 DIAGNOSIS — R079 Chest pain, unspecified: Secondary | ICD-10-CM

## 2013-07-18 DIAGNOSIS — I10 Essential (primary) hypertension: Secondary | ICD-10-CM

## 2013-07-18 DIAGNOSIS — I82409 Acute embolism and thrombosis of unspecified deep veins of unspecified lower extremity: Secondary | ICD-10-CM

## 2013-07-18 DIAGNOSIS — E785 Hyperlipidemia, unspecified: Secondary | ICD-10-CM

## 2013-07-18 DIAGNOSIS — E1165 Type 2 diabetes mellitus with hyperglycemia: Secondary | ICD-10-CM

## 2013-07-18 NOTE — Telephone Encounter (Signed)
Advised pt to continue Magnesium and potassium chloride tablets daily. She verbalizes understanding and is agreeable to this.

## 2013-07-18 NOTE — Assessment & Plan Note (Signed)
Suggested she continue on her simvastatin. Goal LDL less than 70 given coronary artery disease seen on her CT scan of the chest.

## 2013-07-18 NOTE — Progress Notes (Signed)
Patient ID: Kristen [name], female    DOB: [birth year], 73 y.o.   MRN: [id number]  HPI Comments: [name] is a pleasant 73 year old woman with history of diabetes, hyperlipidemia, hypertension recently presented to the hospital 07/02/2013 with chest pain. She presents for followup in the office after discharge. She has a history of obesity.  Chest pain in the hospital was negative for MI, cardiac enzymes negative. 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 This also showed coronary calcifications Stress test showed no ischemia, ejection fraction 61% Lower extremities ultrasound showed no DVT  For followup today she reports that she feels well. She denies any significant chest pain. She reports her blood pressure is higher today but better at home she feels. She does not have a blood pressure cuff at home. She's been taking her medications prescribed at the time of discharge. Arrival to the hospital her blood pressure was very high with systolics in the 920F. This did improve with medical management. She had low potassium and magnesium on arrival to the hospital which was repleted. In the hospital she was also on hydralazine. This was not continued at the time of discharge. She continues to have trace edema on the right side after prior ankle trauma  In general today she feels well with no complaints EKG shows normal sinus rhythm with rate 69 beats per minute, T-wave abnormality in V2 through V6, this was seen previously while in the hospital   Outpatient Encounter Prescriptions as of 07/18/2013  Medication Sig  . aspirin (ASPIR-81) 81 MG EC tablet Take 81 mg by mouth daily.    . Blood Glucose Monitoring Suppl (ONE TOUCH ULTRA SYSTEM KIT) W/DEVICE KIT 1 kit by Does not apply route once.  . clotrimazole (LOTRIMIN) 1 % cream Apply 1 application  topically 2 (two) times daily.  Marland Kitchen glimepiride (AMARYL) 2 MG tablet Take 1 tablet (2 mg total) by mouth daily before breakfast.  . glucose blood test strip 250.02 Use as instructed  . losartan-hydrochlorothiazide (HYZAAR) 100-12.5 MG per tablet Take 1 tablet by mouth daily.  . nitroGLYCERIN (NITROSTAT) 0.4 MG SL tablet Place 0.4 mg under the tongue every 5 (five) minutes as needed for chest pain.  . simvastatin (ZOCOR) 20 MG tablet Take 20 mg by mouth at bedtime.      Review of Systems  Constitutional: Negative.   HENT: Negative.   Eyes: Negative.   Respiratory: Negative.   Cardiovascular: Positive for leg swelling.  Gastrointestinal: Negative.   Endocrine: Negative.   Musculoskeletal: Negative.   Skin: Negative.   Allergic/Immunologic: Negative.   Neurological: Negative.   Hematological: Negative.   Psychiatric/Behavioral: Negative.   All other systems reviewed and are negative.    BP 140/90  Pulse 69  Ht '5\' 2"'  (1.575 m)  Wt 225 lb (102.059 kg)  BMI 41.14 kg/m2  Physical Exam  Nursing note and vitals reviewed. Constitutional: She is oriented to person, place, and time. She appears well-developed and well-nourished.  Obese  HENT:  Head: Normocephalic.  Nose: Nose normal.  Mouth/Throat: Oropharynx is clear and moist.  Eyes: Conjunctivae are normal. Pupils are equal, round, and reactive to light.  Neck: Normal range of motion. Neck supple. No JVD present.  Cardiovascular: Normal rate, regular rhythm, S1 normal, S2 normal, normal heart sounds and intact distal pulses.  Exam reveals no gallop and no friction rub.  No murmur heard. Pulmonary/Chest: Effort normal and breath sounds normal. No respiratory distress. She has no wheezes. She has no rales. She exhibits no tenderness.  Abdominal: Soft. Bowel sounds are normal. She exhibits no distension. There is no tenderness.  Musculoskeletal: Normal range of motion. She exhibits no edema and no tenderness.  Lymphadenopathy:     She has no cervical adenopathy.  Neurological: She is alert and oriented to person, place, and time. Coordination normal.  Skin: Skin is warm and dry. No rash noted. No erythema.  Psychiatric: She has a normal mood and affect. Her behavior is normal. Judgment and thought content normal.    Assessment and Plan

## 2013-07-18 NOTE — Assessment & Plan Note (Signed)
Recent atypical chest pain. Etiology not clear but she did have a negative stress test. Other workup negative

## 2013-07-18 NOTE — Patient Instructions (Signed)
You are doing well. No medication changes were made.  Please call us if you have new issues that need to be addressed before your next appt.  Your physician wants you to follow-up in: 6 months.  You will receive a reminder letter in the mail two months in advance. If you don't receive a letter, please call our office to schedule the follow-up appointment.   

## 2013-07-18 NOTE — Assessment & Plan Note (Signed)
Recent ultrasound of leg showing no DVT, CT scan of the chest with no PE

## 2013-07-18 NOTE — Assessment & Plan Note (Addendum)
We'll recommend she stay on magnesium and potassium given these were both low in the hospital. If this continues to be a significant issue, we'll need to stop HCTZ.

## 2013-07-18 NOTE — Assessment & Plan Note (Signed)
We have encouraged continued exercise, careful diet management in an effort to lose weight. 

## 2013-07-18 NOTE — Assessment & Plan Note (Signed)
Recommended strict diet and walking for weight loss

## 2013-07-20 ENCOUNTER — Other Ambulatory Visit: Payer: Self-pay | Admitting: Family Medicine

## 2013-07-20 MED ORDER — GLIMEPIRIDE 2 MG PO TABS
2.0000 mg | ORAL_TABLET | Freq: Every day | ORAL | Status: DC
Start: 2013-07-20 — End: 2013-12-11

## 2013-08-01 DIAGNOSIS — E785 Hyperlipidemia, unspecified: Secondary | ICD-10-CM

## 2013-08-01 DIAGNOSIS — E1165 Type 2 diabetes mellitus with hyperglycemia: Secondary | ICD-10-CM

## 2013-08-01 DIAGNOSIS — I82409 Acute embolism and thrombosis of unspecified deep veins of unspecified lower extremity: Secondary | ICD-10-CM

## 2013-08-01 DIAGNOSIS — R079 Chest pain, unspecified: Secondary | ICD-10-CM

## 2013-08-01 DIAGNOSIS — IMO0001 Reserved for inherently not codable concepts without codable children: Secondary | ICD-10-CM

## 2013-08-13 ENCOUNTER — Telehealth: Payer: Self-pay

## 2013-08-13 NOTE — Telephone Encounter (Signed)
Relevant patient education mailed to patient.  

## 2013-08-26 ENCOUNTER — Ambulatory Visit: Payer: Medicare Other | Admitting: Family Medicine

## 2013-09-10 ENCOUNTER — Ambulatory Visit (INDEPENDENT_AMBULATORY_CARE_PROVIDER_SITE_OTHER): Payer: Medicare Other | Admitting: Family Medicine

## 2013-09-10 ENCOUNTER — Telehealth: Payer: Self-pay | Admitting: Radiology

## 2013-09-10 ENCOUNTER — Encounter: Payer: Self-pay | Admitting: Family Medicine

## 2013-09-10 VITALS — BP 142/84 | HR 72 | Temp 98.2°F | Wt 220.0 lb

## 2013-09-10 DIAGNOSIS — E876 Hypokalemia: Secondary | ICD-10-CM

## 2013-09-10 DIAGNOSIS — E1165 Type 2 diabetes mellitus with hyperglycemia: Secondary | ICD-10-CM

## 2013-09-10 DIAGNOSIS — Z1231 Encounter for screening mammogram for malignant neoplasm of breast: Secondary | ICD-10-CM

## 2013-09-10 DIAGNOSIS — E785 Hyperlipidemia, unspecified: Secondary | ICD-10-CM

## 2013-09-10 DIAGNOSIS — IMO0002 Reserved for concepts with insufficient information to code with codable children: Secondary | ICD-10-CM

## 2013-09-10 DIAGNOSIS — Z23 Encounter for immunization: Secondary | ICD-10-CM

## 2013-09-10 DIAGNOSIS — I10 Essential (primary) hypertension: Secondary | ICD-10-CM

## 2013-09-10 DIAGNOSIS — IMO0001 Reserved for inherently not codable concepts without codable children: Secondary | ICD-10-CM

## 2013-09-10 LAB — BASIC METABOLIC PANEL
BUN: 26 mg/dL — ABNORMAL HIGH (ref 6–23)
CALCIUM: 9.1 mg/dL (ref 8.4–10.5)
CO2: 30 mEq/L (ref 19–32)
Chloride: 102 mEq/L (ref 96–112)
Creatinine, Ser: 1.4 mg/dL — ABNORMAL HIGH (ref 0.4–1.2)
GFR: 49.14 mL/min — AB (ref >60.00)
Glucose, Bld: 89 mg/dL (ref 70–99)
POTASSIUM: 2.6 meq/L — AB (ref 3.5–5.1)
SODIUM: 139 meq/L (ref 135–145)

## 2013-09-10 LAB — HEMOGLOBIN A1C: Hgb A1c MFr Bld: 8 % — ABNORMAL HIGH (ref 4.6–6.5)

## 2013-09-10 LAB — MAGNESIUM: MAGNESIUM: 1.7 mg/dL (ref 1.5–2.5)

## 2013-09-10 MED ORDER — SIMVASTATIN 20 MG PO TABS: 20.0000 mg | ORAL_TABLET | Freq: Every day | ORAL | Status: DC

## 2013-09-10 MED ORDER — POTASSIUM CHLORIDE CRYS ER 20 MEQ PO TBCR: 20.0000 meq | EXTENDED_RELEASE_TABLET | Freq: Two times a day (BID) | ORAL | Status: DC

## 2013-09-10 MED ORDER — CLOTRIMAZOLE 1 % EX CREA: 1.0000 "application " | TOPICAL_CREAM | Freq: Two times a day (BID) | CUTANEOUS | Status: DC

## 2013-09-10 NOTE — Progress Notes (Signed)
 BP 142/84  Pulse 72  Temp(Src) 98.2 F (36.8 C) (Oral)  Wt 220 lb (99.791 kg)   CC: 3 mo f/u  Subjective:    Patient ID: Kristen [name], female    DOB: 10/14/1940, 73 y.o.   MRN: [id number]  HPI: [name] is a 73 y.o. female presenting on 09/10/2013 for Follow-up   She does not drive and has difficulty finding transportation.  DM - regularly does not check sugars.  Compliant with antihyperglycemic regimen which includes: amaryl 2mg daily.  Could not afford onglyza, did not tolerate metformin. Denies hypoglycemic symptoms. Denies paresthesias. Last diabetic eye exam DUE.  Pneumovax: DUE.  Prevnar: today.  Enjoys baking pies - declines diabetic recipes.  Lab Results  Component Value Date   HGBA1C 8.3* 05/26/2013    HLD - unsure if she's taking simvastatin - will check at home. Refilled today.  Requests referral for mammogram as she's due. Requests referral for diabetic eye exam.  Relevant past medical, surgical, family and social history reviewed and updated as indicated.  Allergies and medications reviewed and updated. Current Outpatient Prescriptions on File Prior to Visit  Medication Sig  . aspirin (ASPIR-81) 81 MG EC tablet Take 81 mg by mouth daily.    . glimepiride (AMARYL) 2 MG tablet Take 1 tablet (2 mg total) by mouth daily with breakfast.  . losartan-hydrochlorothiazide (HYZAAR) 100-12.5 MG per tablet Take 1 tablet by mouth daily.  . nitroGLYCERIN (NITROSTAT) 0.4 MG SL tablet Place 0.4 mg under the tongue every 5 (five) minutes as needed for chest pain.  . Blood Glucose Monitoring Suppl (ONE TOUCH ULTRA SYSTEM KIT) W/DEVICE KIT 1 kit by Does not apply route once.  . glucose blood test strip 250.02 Use as instructed   No current facility-administered medications on file prior to visit.    Review of Systems Per HPI unless specifically indicated above    Objective:    BP 142/84  Pulse 72  Temp(Src) 98.2 F (36.8 C) (Oral)  Wt 220 lb (99.791 kg)    Physical Exam  Nursing note and vitals reviewed. Constitutional: She appears well-developed and well-nourished. No distress.  HENT:  Head: Normocephalic and atraumatic.  Mouth/Throat: Oropharynx is clear and moist. No oropharyngeal exudate.  Eyes: Conjunctivae and EOM are normal. Pupils are equal, round, and reactive to light. No scleral icterus.  Neck: Normal range of motion. Neck supple.  Cardiovascular: Normal rate, regular rhythm, normal heart sounds and intact distal pulses.   No murmur heard. Pulmonary/Chest: Effort normal and breath sounds normal. No respiratory distress. She has no wheezes. She has no rales.  Musculoskeletal: She exhibits no edema.  Diabetic foot exam: Normal inspection No skin breakdown No calluses  Normal DP/PT pulses Normal sensation to light touch and monofilament Nails normal  Lymphadenopathy:    She has no cervical adenopathy.  Skin: Skin is warm and dry. No rash noted.  Psychiatric: She has a normal mood and affect.   Results for orders placed in visit on 05/26/13  BASIC METABOLIC PANEL      Result Value Ref Range   Sodium 138  135 - 145 mEq/L   Potassium 3.5  3.5 - 5.1 mEq/L   Chloride 102  96 - 112 mEq/L   CO2 30  19 - 32 mEq/L   Glucose, Bld 187 (*) 70 - 99 mg/dL   BUN 17  6 - 23 mg/dL   Creatinine, Ser 1.1  0.4 - 1.2 mg/dL   Calcium 9.0  8.4 -   10.5 mg/dL   GFR 62.17  >60.00 mL/min  HEMOGLOBIN A1C      Result Value Ref Range   Hemoglobin A1C 8.3 (*) 4.6 - 6.5 %      Assessment & Plan:   Problem List Items Addressed This Visit   HYPERTENSION, BENIGN     Chronic, stable. Continue meds. Check Mg and K today.    Relevant Medications      simvastatin (ZOCOR) tablet   Other Relevant Orders      Basic metabolic panel      Magnesium   HLD (hyperlipidemia)     Pt unsure if she's taking simvastatin at home - pt will check at home and with pharmacy regarding this question. goal LDL <70 given CAD on CT scan. Discussed this with patient.     Relevant Medications      simvastatin (ZOCOR) tablet   Diabetes type 2, uncontrolled - Primary     Does not check sugars.  Not interested in checking sugars. Not interested in DMSE. Will recheck blood work today, encouraged compliance with amaryl. Schedule eye exam today.    Relevant Medications      simvastatin (ZOCOR) tablet   Other Relevant Orders      Hemoglobin A1c      Basic metabolic panel      Ambulatory referral to Ophthalmology      HM DIABETES FOOT EXAM (Completed)    Other Visit Diagnoses   Other screening mammogram        Relevant Orders       MM DIGITAL SCREENING BILATERAL        Follow up plan: Return in about 3 months (around 12/11/2013), or as needed, for annual exam, prior fasting for blood work.   

## 2013-09-10 NOTE — Assessment & Plan Note (Signed)
Does not check sugars.  Not interested in checking sugars. Not interested in DMSE. Will recheck blood work today, encouraged compliance with amaryl. Schedule eye exam today.

## 2013-09-10 NOTE — Telephone Encounter (Signed)
Patient notified as instructed by telephone. Advised patient that this message is being sent to Dr. [name] and he will be in touch with her about the follow-up lab work.

## 2013-09-10 NOTE — Telephone Encounter (Signed)
Please call pt.  K was low, needs to start potassium and then get a repeat BMET in the near future.   I'll defer the f/u BMET order to Dr. Darnell Level so it can come back to him.   I sent the rx for potassium.   Thanks.   Also routed to Dr. Darnell Level as a FYI.

## 2013-09-10 NOTE — Progress Notes (Signed)
Pre visit review using our clinic review tool, if applicable. No additional management support is needed unless otherwise documented below in the visit note. 

## 2013-09-10 NOTE — Telephone Encounter (Signed)
[facility] called a critical K+, 2.6. Results given to Dr Damita Dunnings.

## 2013-09-10 NOTE — Assessment & Plan Note (Signed)
Pt unsure if she's taking simvastatin at home - pt will check at home and with pharmacy regarding this question. goal LDL <70 given CAD on CT scan. Discussed this with patient.

## 2013-09-10 NOTE — Patient Instructions (Signed)
Prevnar today. We will call you in next few weeks for appointment for eye exam for diabetes (with [facility]) and mammogram [facility] [place]). Let me know if you'd like referral for diabetes education - I recommend this). Double check at home that you're taking simvastatin - ok to take in am. Return in 3-4 months for medicare wellness visit.

## 2013-09-10 NOTE — Addendum Note (Signed)
Addended by: Royann Shivers A on: 09/10/2013 12:32 PM   Modules accepted: Orders

## 2013-09-10 NOTE — Telephone Encounter (Signed)
Left message on answering machine to call back. Unable to reach either son by telephone. Will try again later.

## 2013-09-10 NOTE — Assessment & Plan Note (Signed)
Chronic, stable. Continue meds. Check Mg and K today.

## 2013-09-11 MED ORDER — AMLODIPINE BESYLATE 5 MG PO TABS: 5.0000 mg | ORAL_TABLET | Freq: Every day | ORAL | Status: DC

## 2013-09-11 MED ORDER — LOSARTAN POTASSIUM 100 MG PO TABS: 100.0000 mg | ORAL_TABLET | Freq: Every day | ORAL | Status: DC

## 2013-09-11 NOTE — Telephone Encounter (Signed)
Noted.  hctz causing low potassium. plz call pt - stop losartan hctz and start plain losartan 100mg  daily along with new medication amlodipine 5mg  daily for blood pressure - both sent to pharmacy. Finish potassium 10 d course and return next week to repeat BMP (ordered).

## 2013-09-11 NOTE — Telephone Encounter (Signed)
Phone still busy.Will try again later

## 2013-09-11 NOTE — Telephone Encounter (Signed)
Attempted to call numerous time. Line was busy. Will try again later.

## 2013-09-11 NOTE — Telephone Encounter (Signed)
Pt left v/m requesting cb; pt request clarification of med pt picked up from pharmacy.

## 2013-09-12 NOTE — Telephone Encounter (Signed)
Spoke with patient and clarified questions

## 2013-09-12 NOTE — Telephone Encounter (Addendum)
Attempted to call patient again. Phone still busy. ? Off the hook. Will try again later. Attempted to call # listed for patient's son, [name]. Female answered and said there was no one there by that name.

## 2013-11-29 ENCOUNTER — Other Ambulatory Visit: Payer: Self-pay | Admitting: Family Medicine

## 2013-11-29 DIAGNOSIS — E1165 Type 2 diabetes mellitus with hyperglycemia: Secondary | ICD-10-CM

## 2013-11-29 DIAGNOSIS — D649 Anemia, unspecified: Secondary | ICD-10-CM

## 2013-11-29 DIAGNOSIS — E785 Hyperlipidemia, unspecified: Secondary | ICD-10-CM

## 2013-11-29 DIAGNOSIS — N289 Disorder of kidney and ureter, unspecified: Secondary | ICD-10-CM

## 2013-11-29 DIAGNOSIS — I10 Essential (primary) hypertension: Secondary | ICD-10-CM

## 2013-11-29 DIAGNOSIS — IMO0002 Reserved for concepts with insufficient information to code with codable children: Secondary | ICD-10-CM

## 2013-12-08 ENCOUNTER — Other Ambulatory Visit (INDEPENDENT_AMBULATORY_CARE_PROVIDER_SITE_OTHER): Payer: Medicare Other

## 2013-12-08 DIAGNOSIS — D649 Anemia, unspecified: Secondary | ICD-10-CM

## 2013-12-08 DIAGNOSIS — IMO0002 Reserved for concepts with insufficient information to code with codable children: Secondary | ICD-10-CM

## 2013-12-08 DIAGNOSIS — IMO0001 Reserved for inherently not codable concepts without codable children: Secondary | ICD-10-CM

## 2013-12-08 DIAGNOSIS — N289 Disorder of kidney and ureter, unspecified: Secondary | ICD-10-CM

## 2013-12-08 DIAGNOSIS — I10 Essential (primary) hypertension: Secondary | ICD-10-CM

## 2013-12-08 DIAGNOSIS — E785 Hyperlipidemia, unspecified: Secondary | ICD-10-CM

## 2013-12-08 DIAGNOSIS — E1165 Type 2 diabetes mellitus with hyperglycemia: Secondary | ICD-10-CM

## 2013-12-08 LAB — CBC WITH DIFFERENTIAL/PLATELET
Basophils Absolute: 0 10*3/uL (ref 0.0–0.1)
Basophils Relative: 0.4 % (ref 0.0–3.0)
Eosinophils Absolute: 0.1 10*3/uL (ref 0.0–0.7)
Eosinophils Relative: 1.4 % (ref 0.0–5.0)
HCT: 35.4 % — ABNORMAL LOW (ref 36.0–46.0)
HEMOGLOBIN: 11.4 g/dL — AB (ref 12.0–15.0)
LYMPHS PCT: 35.5 % (ref 12.0–46.0)
Lymphs Abs: 2.3 10*3/uL (ref 0.7–4.0)
MCHC: 32.2 g/dL (ref 30.0–36.0)
MCV: 87.1 fl (ref 78.0–100.0)
MONOS PCT: 6.8 % (ref 3.0–12.0)
Monocytes Absolute: 0.4 10*3/uL (ref 0.1–1.0)
NEUTROS ABS: 3.5 10*3/uL (ref 1.4–7.7)
Neutrophils Relative %: 55.9 % (ref 43.0–77.0)
PLATELETS: 248 10*3/uL (ref 150.0–400.0)
RBC: 4.06 Mil/uL (ref 3.87–5.11)
RDW: 16.1 % — ABNORMAL HIGH (ref 11.5–15.5)
WBC: 6.4 10*3/uL (ref 4.0–10.5)

## 2013-12-08 LAB — RENAL FUNCTION PANEL
Albumin: 3.5 g/dL (ref 3.5–5.2)
BUN: 14 mg/dL (ref 6–23)
CO2: 24 mEq/L (ref 19–32)
CREATININE: 1.1 mg/dL (ref 0.4–1.2)
Calcium: 9.1 mg/dL (ref 8.4–10.5)
Chloride: 104 mEq/L (ref 96–112)
GFR: 62.08 mL/min (ref >60.00)
Glucose, Bld: 129 mg/dL — ABNORMAL HIGH (ref 70–99)
Phosphorus: 3 mg/dL (ref 2.3–4.6)
Potassium: 3.7 mEq/L (ref 3.5–5.1)
Sodium: 140 mEq/L (ref 135–145)

## 2013-12-08 LAB — MICROALBUMIN / CREATININE URINE RATIO
CREATININE, U: 184.8 mg/dL
MICROALB UR: 2.6 mg/dL — AB (ref 0.0–1.9)
Microalb Creat Ratio: 1.4 mg/g (ref 0.0–30.0)

## 2013-12-08 LAB — HEMOGLOBIN A1C: HEMOGLOBIN A1C: 7.6 % — AB (ref 4.6–6.5)

## 2013-12-08 LAB — VITAMIN D 25 HYDROXY (VIT D DEFICIENCY, FRACTURES): VITD: 21.09 ng/mL — ABNORMAL LOW (ref 30.00–100.00)

## 2013-12-08 LAB — FERRITIN: Ferritin: 63.8 ng/mL (ref 10.0–291.0)

## 2013-12-08 LAB — LIPID PANEL
Cholesterol: 140 mg/dL (ref 0–200)
HDL: 42.4 mg/dL (ref >39.00)
LDL Cholesterol: 78 mg/dL (ref 0–99)
NONHDL: 97.6
Total CHOL/HDL Ratio: 3
Triglycerides: 96 mg/dL (ref 0.0–149.0)
VLDL: 19.2 mg/dL (ref 0.0–40.0)

## 2013-12-08 LAB — IBC PANEL
IRON: 52 ug/dL (ref 42–145)
SATURATION RATIOS: 16.9 % — AB (ref 20.0–50.0)
TRANSFERRIN: 219.2 mg/dL (ref 212.0–360.0)

## 2013-12-10 LAB — HM DIABETES EYE EXAM

## 2013-12-11 ENCOUNTER — Ambulatory Visit (INDEPENDENT_AMBULATORY_CARE_PROVIDER_SITE_OTHER): Payer: Medicare Other | Admitting: Family Medicine

## 2013-12-11 ENCOUNTER — Encounter (INDEPENDENT_AMBULATORY_CARE_PROVIDER_SITE_OTHER): Payer: Self-pay

## 2013-12-11 ENCOUNTER — Encounter: Payer: Self-pay | Admitting: Family Medicine

## 2013-12-11 VITALS — BP 136/84 | HR 72 | Temp 98.2°F | Ht 62.0 in | Wt 221.5 lb

## 2013-12-11 DIAGNOSIS — Z0001 Encounter for general adult medical examination with abnormal findings: Secondary | ICD-10-CM | POA: Insufficient documentation

## 2013-12-11 DIAGNOSIS — IMO0002 Reserved for concepts with insufficient information to code with codable children: Secondary | ICD-10-CM

## 2013-12-11 DIAGNOSIS — I2510 Atherosclerotic heart disease of native coronary artery without angina pectoris: Secondary | ICD-10-CM

## 2013-12-11 DIAGNOSIS — M1710 Unilateral primary osteoarthritis, unspecified knee: Secondary | ICD-10-CM

## 2013-12-11 DIAGNOSIS — E785 Hyperlipidemia, unspecified: Secondary | ICD-10-CM

## 2013-12-11 DIAGNOSIS — I10 Essential (primary) hypertension: Secondary | ICD-10-CM

## 2013-12-11 DIAGNOSIS — M170 Bilateral primary osteoarthritis of knee: Secondary | ICD-10-CM

## 2013-12-11 DIAGNOSIS — E1165 Type 2 diabetes mellitus with hyperglycemia: Secondary | ICD-10-CM

## 2013-12-11 DIAGNOSIS — Z1231 Encounter for screening mammogram for malignant neoplasm of breast: Secondary | ICD-10-CM

## 2013-12-11 DIAGNOSIS — Z7189 Other specified counseling: Secondary | ICD-10-CM

## 2013-12-11 DIAGNOSIS — Z0000 Encounter for general adult medical examination without abnormal findings: Secondary | ICD-10-CM | POA: Insufficient documentation

## 2013-12-11 DIAGNOSIS — IMO0001 Reserved for inherently not codable concepts without codable children: Secondary | ICD-10-CM

## 2013-12-11 MED ORDER — GLIMEPIRIDE 2 MG PO TABS: 2.0000 mg | ORAL_TABLET | Freq: Every day | ORAL | Status: DC

## 2013-12-11 NOTE — Progress Notes (Signed)
BP 136/84  Pulse 72  Temp(Src) 98.2 F (36.8 C) (Oral)  Ht _0  (1.575 m)  Wt 221 lb 8 oz (100.472 kg)  BMI 40.50 kg/m2   CC: medicare wellness visit  Subjective:    Patient ID: Kristen [name], female    DOB: [birth year], 73 y.o.   MRN: [id number]  HPI: Kristen [name] is a 73 y.o. female presenting on 12/11/2013 for Annual Exam   Activity level limited by L knee pain. Takes tylenol for pain prn. H/o OA, s/p R knee replacement. Would want to try cortisone shot prior to seeing surgeon.   Failed hearing screen today - Evanny has noticed she has to increase volume on TV. Endorses some fullness of ears. Vision exam done yesterday  1 fall in last year - tripped over son's shoe. No injury. Denies depression/anhedonia.  H/o PE - 2012, completed 6 months of coumadin. Continues aspirin daily. Saw hematology Dr. Cynda Acres. Workup not available.   Preventative: S/p colonoscopy "a while ago" with Dr. Brunetta Genera. Denies blood in stool. No fmhx colon cancer. S/p hysterectomy for fibroids.  Mammogram - done at [facility] about 2 yrs ago. Breast exam today - will set up f/u mammogram. Flu shot - declines prevnar 08/2013 zostavax - declines Advanced directive: does not have set up. Would like info on this. Doesn't have HCPOA in mind. Doesn't want prolonged life support.  Lives alone  4 grown children  Education: 11th grade Occupation: retired, was Scientist, water quality  Activity: Does not regularly exercise. Activity level limited by L knee pain.  Diet: good water, fruits/vegetables seldom   Relevant past medical, surgical, family and social history reviewed and updated as indicated.  Allergies and medications reviewed and updated. Current Outpatient Prescriptions on File Prior to Visit  Medication Sig  . amLODipine (NORVASC) 5 MG tablet Take 1 tablet (5 mg total) by mouth daily.  Marland Kitchen aspirin (ASPIR-81) 81 MG EC tablet Take 81 mg by mouth daily.    Marland Kitchen losartan (COZAAR) 100 MG tablet Take 1 tablet  (100 mg total) by mouth daily.  . nitroGLYCERIN (NITROSTAT) 0.4 MG SL tablet Place 0.4 mg under the tongue every 5 (five) minutes as needed for chest pain.  . simvastatin (ZOCOR) 20 MG tablet Take 1 tablet (20 mg total) by mouth at bedtime.  . Blood Glucose Monitoring Suppl (ONE TOUCH ULTRA SYSTEM KIT) W/DEVICE KIT 1 kit by Does not apply route once.  Marland Kitchen glucose blood test strip 250.02 Use as instructed   No current facility-administered medications on file prior to visit.    Review of Systems  Constitutional: Negative for fever, chills, activity change, appetite change, fatigue and unexpected weight change.  HENT: Negative for hearing loss.   Eyes: Negative for visual disturbance.  Respiratory: Negative for cough, chest tightness, shortness of breath and wheezing.   Cardiovascular: Negative for chest pain, palpitations and leg swelling.  Gastrointestinal: Negative for nausea, vomiting, abdominal pain, diarrhea, constipation, blood in stool and abdominal distention.  Genitourinary: Negative for hematuria and difficulty urinating.  Musculoskeletal: Positive for arthralgias (L knee). Negative for myalgias and neck pain.  Skin: Negative for rash.  Neurological: Negative for dizziness, seizures, syncope and headaches.  Hematological: Negative for adenopathy. Does not bruise/bleed easily.  Psychiatric/Behavioral: Negative for dysphoric mood. The patient is not nervous/anxious.    Per HPI unless specifically indicated above    Objective:    BP 136/84  Pulse 72  Temp(Src) 98.2 F (36.8 C) (Oral)  Ht _1  (1.575  m)  Wt 221 lb 8 oz (100.472 kg)  BMI 40.50 kg/m2  Physical Exam  Nursing note and vitals reviewed. Constitutional: She is oriented to person, place, and time. She appears well-developed and well-nourished. No distress.  HENT:  Head: Normocephalic and atraumatic.  Right Ear: Tympanic membrane, external ear and ear canal normal. Decreased hearing is noted.  Left Ear: Tympanic  membrane, external ear and ear canal normal. Decreased hearing is noted.  Nose: Nose normal.  Mouth/Throat: Uvula is midline, oropharynx is clear and moist and mucous membranes are normal. No oropharyngeal exudate, posterior oropharyngeal edema or posterior oropharyngeal erythema.  No cerumen impaction in bilateral ears  Eyes: Conjunctivae and EOM are normal. Pupils are equal, round, and reactive to light. No scleral icterus.  Neck: Normal range of motion. Neck supple. No thyromegaly present.  Cardiovascular: Normal rate, regular rhythm, normal heart sounds and intact distal pulses.   No murmur heard. Pulses:      Radial pulses are 2+ on the right side, and 2+ on the left side.  Pulmonary/Chest: Effort normal and breath sounds normal. No respiratory distress. She has no wheezes. She has no rales.  Breast: pt declines. Pending scheduling mamomgram  Abdominal: Soft. Bowel sounds are normal. She exhibits no distension and no mass. There is no tenderness. There is no rebound and no guarding.  Genitourinary:  Deferred - s/p hysterectomy  Musculoskeletal: Normal range of motion. She exhibits no edema.  Lymphadenopathy:       Head (right side): No submandibular, no tonsillar, no preauricular and no posterior auricular adenopathy present.       Head (left side): No submandibular, no tonsillar, no preauricular and no posterior auricular adenopathy present.    She has no cervical adenopathy.       Right: No supraclavicular adenopathy present.       Left: No supraclavicular adenopathy present.  Neurological: She is alert and oriented to person, place, and time.  CN grossly intact, station and gait intact Recall 2/3 even with cue Calculation 3/5 D-L-O-R-[id number]  Skin: Skin is warm and dry. No rash noted.  Psychiatric: She has a normal mood and affect. Her behavior is normal. Judgment and thought content normal.   Results for orders placed in visit on 12/11/13  HM DIABETES EYE EXAM      Result Value Ref  Range   HM Diabetic Eye Exam No Retinopathy  No Retinopathy      Assessment & Plan:   Problem List Items Addressed This Visit   Advanced care planning/counseling discussion     Advanced directive: does not have set up. Would like info on this. Doesn't have HCPOA in mind. Doesn't want prolonged life support.    CAD (coronary artery disease)   Diabetes type 2, uncontrolled      Does not check sugars.  A1c stable for age. Lab Results  Component Value Date   HGBA1C 7.6* 12/08/2013      Relevant Medications      glimepiride (AMARYL) tablet   Health maintenance examination     Preventative protocols reviewed and updated unless pt declined. Discussed healthy diet and lifestyle. Will order screening mammogram for [facility].    HLD (hyperlipidemia)     Chronic, stable. Continue regimen. Ideally goal <70 given CAD by CT scan and h/o MI but no changes were made today.    HYPERTENSION, BENIGN     Chronic, stable. Continue regimen.    Medicare annual wellness visit, subsequent - Primary  I have personally reviewed the Medicare Annual Wellness questionnaire and have noted 1. The patient's medical and social history 2. Their use of alcohol, tobacco or illicit drugs 3. Their current medications and supplements 4. The patient's functional ability including ADL's, fall risks, home safety risks and hearing or visual impairment. 5. Diet and physical activity 6. Evidence for depression or mood disorders The patients weight, height, BMI have been recorded in the chart.  Hearing and vision has been addressed. I have made referrals, counseling and provided education to the patient based review of the above and I have provided the pt with a written personalized care plan for preventive services. Provider list updated - see scanned questionairre. Advanced directive: does not have set up. Would like info on this. Doesn't have HCPOA in mind. Doesn't want prolonged life  support.  Reviewed preventative protocols and updated unless pt declined.    Osteoarthritis     Consider Xray and steroid shot at next visit.     Other Visit Diagnoses   Other screening mammogram        Relevant Orders       MM DIGITAL SCREENING BILATERAL        Follow up plan: Return in about 6 months (around 06/13/2014), or as needed, for follow up .

## 2013-12-11 NOTE — Assessment & Plan Note (Signed)
I have personally reviewed the Medicare Annual Wellness questionnaire and have noted 1. The patient's medical and social history 2. Their use of alcohol, tobacco or illicit drugs 3. Their current medications and supplements 4. The patient's functional ability including ADL's, fall risks, home safety risks and hearing or visual impairment. 5. Diet and physical activity 6. Evidence for depression or mood disorders The patients weight, height, BMI have been recorded in the chart.  Hearing and vision has been addressed. I have made referrals, counseling and provided education to the patient based review of the above and I have provided the pt with a written personalized care plan for preventive services. Provider list updated - see scanned questionairre. Advanced directive: does not have set up. Would like info on this. Doesn't have HCPOA in mind. Doesn't want prolonged life support.  Reviewed preventative protocols and updated unless pt declined.

## 2013-12-11 NOTE — Assessment & Plan Note (Signed)
Consider Xray and steroid shot at next visit.

## 2013-12-11 NOTE — Assessment & Plan Note (Signed)
Advanced directive: does not have set up. Would like info on this. Doesn't have HCPOA in mind. Doesn't want prolonged life support.

## 2013-12-11 NOTE — Assessment & Plan Note (Addendum)
Preventative protocols reviewed and updated unless pt declined. Discussed healthy diet and lifestyle. Will order screening mammogram for [facility].

## 2013-12-11 NOTE — Patient Instructions (Addendum)
Sign another release up front for Dr. Hetty Blend records. Advanced directive packet provided. Start multivitamin with iron and at least 1000 units of vitamin D daily. Let me know if you'd like referral to hearing doctor. We will set you up with mammogram at [facility] center as you're due (next month). Good to see you today, call us with questions. Return in 4-6 months for follow up visit - may do steroid shot at that time.

## 2013-12-11 NOTE — Assessment & Plan Note (Signed)
Chronic, stable. Continue regimen. 

## 2013-12-11 NOTE — Progress Notes (Signed)
Pre visit review using our clinic review tool, if applicable. No additional management support is needed unless otherwise documented below in the visit note. 

## 2013-12-11 NOTE — Assessment & Plan Note (Addendum)
Does not check sugars.  A1c stable for age. Lab Results  Component Value Date   HGBA1C 7.6* 12/08/2013

## 2013-12-11 NOTE — Assessment & Plan Note (Signed)
Chronic, stable. Continue regimen. Ideally goal <70 given CAD by CT scan and h/o MI but no changes were made today.

## 2013-12-18 ENCOUNTER — Encounter: Payer: Self-pay | Admitting: Family Medicine

## 2014-01-27 ENCOUNTER — Encounter: Payer: Self-pay | Admitting: Family Medicine

## 2014-01-27 ENCOUNTER — Ambulatory Visit (INDEPENDENT_AMBULATORY_CARE_PROVIDER_SITE_OTHER): Payer: Medicare Other | Admitting: Family Medicine

## 2014-01-27 ENCOUNTER — Ambulatory Visit (INDEPENDENT_AMBULATORY_CARE_PROVIDER_SITE_OTHER)
Admission: RE | Admit: 2014-01-27 | Discharge: 2014-01-27 | Disposition: A | Discharge status: Home / Self Care | Payer: Medicare Other | Source: Ambulatory Visit | Attending: Family Medicine | Admitting: Family Medicine

## 2014-01-27 VITALS — BP 144/84 | HR 76 | Temp 98.1°F | Wt 223.0 lb

## 2014-01-27 DIAGNOSIS — M1712 Unilateral primary osteoarthritis, left knee: Secondary | ICD-10-CM | POA: Insufficient documentation

## 2014-01-27 NOTE — Patient Instructions (Addendum)
Call your insurance about the shingles shot to see if it is covered or how much it would cost and where is cheaper (here or pharmacy).  If you want to receive here, call for nurse visit. Left knee injection today. Rest knee over next day. If any redness or warmth of knee, return right away. Knee Injection Joint injections are shots. Your caregiver will place a needle into your knee joint. The needle is used to put medicine into the joint. These shots can be used to help treat different painful knee conditions such as osteoarthritis, bursitis, local flare-ups of rheumatoid arthritis, and pseudogout. Anti-inflammatory medicines such as corticosteroids and anesthetics are the most common medicines used for joint and soft tissue injections.  PROCEDURE  The skin over the kneecap will be cleaned with an antiseptic solution.  Your caregiver will inject a small amount of a local anesthetic (a medicine like Novocaine) just under the skin in the area that was cleaned.  After the area becomes numb, a second injection is done. This second injection usually includes an anesthetic and an anti-inflammatory medicine called a steroid or cortisone. The needle is carefully placed in between the kneecap and the knee, and the medicine is injected into the joint space.  After the injection is done, the needle is removed. Your caregiver may place a bandage over the injection site. The whole procedure takes no more than a couple of minutes. BEFORE THE PROCEDURE  Wash all of the skin around the entire knee area. Try to remove any loose, scaling skin. There is no other specific preparation necessary unless advised otherwise by your caregiver. LET YOUR CAREGIVER KNOW ABOUT:   Allergies.  Medications taken including herbs, eye drops, over the counter medications, and creams.  Use of steroids (by mouth or creams).  Possible pregnancy, if applicable.  Previous problems with anesthetics or Novocaine.  History of blood  clots (thrombophlebitis).  History of bleeding or blood problems.  Previous surgery.  Other health problems. RISKS AND COMPLICATIONS Side effects from cortisone shots are rare. They include:   Slight bruising of the skin.  Shrinkage of the normal fatty tissue under the skin where the shot was given.  Increase in pain after the shot.  Infection.  Weakening of tendons or tendon rupture.  Allergic reaction to the medicine.  Diabetics may have a temporary increase in their blood sugar after a shot.  Cortisone can temporarily weaken the immune system. While receiving these shots, you should not get certain vaccines. Also, avoid contact with anyone who has chickenpox or measles. Especially if you have never had these diseases or have not been previously immunized. Your immune system may not be strong enough to fight off the infection while the cortisone is in your system. AFTER THE PROCEDURE   You can go home after the procedure.  You may need to put ice on the joint 15-20 minutes every 3 or 4 hours until the pain goes away.  You may need to put an elastic bandage on the joint. HOME CARE INSTRUCTIONS   Only take over-the-counter or prescription medicines for pain, discomfort, or fever as directed by your caregiver.  You should avoid stressing the joint. Unless advised otherwise, avoid activities that put a lot of pressure on a knee joint, such as:  Jogging.  Bicycling.  Recreational climbing.  Hiking.  Laying down and elevating the leg/knee above the level of your heart can help to minimize swelling. SEEK MEDICAL CARE IF:   You have repeated or  worsening swelling.  There is drainage from the puncture area.  You develop red streaking that extends above or below the site where the needle was inserted. SEEK IMMEDIATE MEDICAL CARE IF:   You develop a fever.  You have pain that gets worse even though you are taking pain medicine.  The area is red and warm, and you  have trouble moving the joint. MAKE SURE YOU:   Understand these instructions.  Will watch your condition.  Will get help right away if you are not doing well or get worse. Document Released: 07/02/2006 Document Revised: 07/03/2011 Document Reviewed: 03/29/2007 [facility] Patient Information 2015 [place], [place]. This information is not intended to replace advice given to you by your health care provider. Make sure you discuss any questions you have with your health care provider.

## 2014-01-27 NOTE — Progress Notes (Signed)
   BP 144/84  Pulse 76  Temp(Src) 98.1 F (36.7 C) (Oral)  Wt 223 lb (101.152 kg)   CC: L knee pain  Subjective:    Patient ID: Kristen [name], female    DOB: Jul 31, 1940, 73 y.o.   MRN: [id number]  HPI: Kristen [name] is a 73 y.o. female presenting on 01/27/2014 for Knee Pain   Known h/o knee OA. Activity level now severely limited by L knee pain and getting worse. Trouble sleeping at night, trouble walking. Uses cane when going outdoors. Takes tylenol for pain prn - to no avail. Would want to try cortisone shot prior to seeing surgeon. Denies redness or warmth of knee or swelling. She does have handicap placard.  H/o knee OA, s/p R knee replacement. She has had good result for steroid shot to right knee in the past.  Relevant past medical, surgical, family and social history reviewed and updated as indicated.  Allergies and medications reviewed and updated. Current Outpatient Prescriptions on File Prior to Visit  Medication Sig  . amLODipine (NORVASC) 5 MG tablet Take 1 tablet (5 mg total) by mouth daily.  Marland Kitchen aspirin (ASPIR-81) 81 MG EC tablet Take 81 mg by mouth daily.    . Blood Glucose Monitoring Suppl (ONE TOUCH ULTRA SYSTEM KIT) W/DEVICE KIT 1 kit by Does not apply route once.  Marland Kitchen glimepiride (AMARYL) 2 MG tablet Take 1 tablet (2 mg total) by mouth daily with breakfast.  . glucose blood test strip 250.02 Use as instructed  . losartan (COZAAR) 100 MG tablet Take 1 tablet (100 mg total) by mouth daily.  . Multiple Vitamins-Iron (MULTIVITAMIN/IRON PO) Take 1 tablet by mouth daily.  . nitroGLYCERIN (NITROSTAT) 0.4 MG SL tablet Place 0.4 mg under the tongue every 5 (five) minutes as needed for chest pain.  . simvastatin (ZOCOR) 20 MG tablet Take 1 tablet (20 mg total) by mouth at bedtime.   No current facility-administered medications on file prior to visit.    Review of Systems Per HPI unless specifically indicated above    Objective:    BP 144/84  Pulse 76  Temp(Src) 98.1  F (36.7 C) (Oral)  Wt 223 lb (101.152 kg)  Physical Exam  Vitals reviewed. Constitutional: She appears well-developed and well-nourished. No distress.  Musculoskeletal: She exhibits no edema.  R knee s/p joint replacement L Knee exam: No deformity on inspection. + pain with palpation of anterior and lateral knee No effusion/swelling noted. Limited ROM extension 2/2 pain with crepitus. No popliteal fullness. Neg drawer test. Pain with mcmurray test. No pain with valgus/varus stress.   L knee steroid injection: IC obtained and in chart. Landmarks palpated. Skin cleaned with alcohol wipes. Anesthesia achieved with ethyl chloride. Using lateral inferior approach with knee flexed, 1cc depo medrol 35m and 4 cc lidocaine 1% injected into knee joint using 22g 1.5 in needle. Pt tolerated well. Post procedure instructions provided.    Assessment & Plan:   Problem List Items Addressed This Visit   Primary osteoarthritis of left knee - Primary     Xray done today - marked medial compartment osteoarthritis.  Knee injection performed today after informed consent obtained. Post injection care discussed. Declines stronger pain med. If not improved with above, will refer to ortho. Pt agrees with plan.    Relevant Orders      DG Knee Complete 4 Views Left       Follow up plan: Return if symptoms worsen or fail to improve.

## 2014-01-27 NOTE — Assessment & Plan Note (Signed)
Xray done today - marked medial compartment osteoarthritis.  Knee injection performed today after informed consent obtained. Post injection care discussed. Declines stronger pain med. If not improved with above, will refer to ortho. Pt agrees with plan.

## 2014-01-27 NOTE — Progress Notes (Signed)
Pre visit review using our clinic review tool, if applicable. No additional management support is needed unless otherwise documented below in the visit note. 

## 2014-01-28 ENCOUNTER — Telehealth: Payer: Self-pay

## 2014-01-28 NOTE — Telephone Encounter (Signed)
Due to transportation issues pt request shingles vaccine be given at 04/21/14 visit already scheduled. Pt has already spoke with ins co about coverage.

## 2014-03-04 ENCOUNTER — Ambulatory Visit: Payer: Medicare Other

## 2014-03-10 ENCOUNTER — Emergency Department: Payer: Self-pay | Admitting: Emergency Medicine

## 2014-03-10 LAB — COMPREHENSIVE METABOLIC PANEL
ALBUMIN: 3.6 g/dL (ref 3.4–5.0)
ALT: 23 U/L
ANION GAP: 5 — AB (ref 7–16)
Alkaline Phosphatase: 122 U/L — ABNORMAL HIGH
BUN: 20 mg/dL — ABNORMAL HIGH (ref 7–18)
Bilirubin,Total: 0.8 mg/dL (ref 0.2–1.0)
Calcium, Total: 9.5 mg/dL (ref 8.5–10.1)
Chloride: 103 mmol/L (ref 98–107)
Co2: 28 mmol/L (ref 21–32)
Creatinine: 1.34 mg/dL — ABNORMAL HIGH (ref 0.60–1.30)
GFR CALC AF AMER: 50 — AB
GFR CALC NON AF AMER: 41 — AB
Glucose: 141 mg/dL — ABNORMAL HIGH (ref 65–99)
Osmolality: 277 (ref 275–301)
Potassium: 4.1 mmol/L (ref 3.5–5.1)
SGOT(AST): 17 U/L (ref 15–37)
Sodium: 136 mmol/L (ref 136–145)
Total Protein: 8.5 g/dL — ABNORMAL HIGH (ref 6.4–8.2)

## 2014-03-10 LAB — CBC
HCT: 39.8 % (ref 35.0–47.0)
HGB: 12.5 g/dL (ref 12.0–16.0)
MCH: 27.4 pg (ref 26.0–34.0)
MCHC: 31.5 g/dL — ABNORMAL LOW (ref 32.0–36.0)
MCV: 87 fL (ref 80–100)
PLATELETS: 253 10*3/uL (ref 150–440)
RBC: 4.58 10*6/uL (ref 3.80–5.20)
RDW: 15.3 % — ABNORMAL HIGH (ref 11.5–14.5)
WBC: 12.8 10*3/uL — AB (ref 3.6–11.0)

## 2014-03-10 LAB — URINALYSIS, COMPLETE
Bilirubin,UR: NEGATIVE
Blood: NEGATIVE
Glucose,UR: NEGATIVE mg/dL (ref 0–75)
Hyaline Cast: 3
KETONE: NEGATIVE
LEUKOCYTE ESTERASE: NEGATIVE
Nitrite: NEGATIVE
PROTEIN: NEGATIVE
Ph: 5 (ref 4.5–8.0)
RBC,UR: <1 /HPF (ref 0–5)
SPECIFIC GRAVITY: 1.017 (ref 1.003–1.030)
Squamous Epithelial: 4

## 2014-03-10 LAB — LIPASE, BLOOD: LIPASE: 149 U/L (ref 73–393)

## 2014-03-12 LAB — URINE CULTURE

## 2014-03-23 ENCOUNTER — Ambulatory Visit: Payer: Medicare Other | Admitting: Cardiovascular Disease

## 2014-03-31 ENCOUNTER — Telehealth: Payer: Self-pay

## 2014-03-31 NOTE — Telephone Encounter (Signed)
Spoke with patient -she denied fever and she absolutely refused to go to urgent care because she said she didn't have the $ and they didn't do anything for her. She said she had pain meds and would just take those until tomorrow. She wouldn't be able to p/u abx until tomorrow, so I didn't send them in. She will come in at 10am tomorrow with the understanding that if pain worsens to go to ER.

## 2014-03-31 NOTE — Telephone Encounter (Signed)
If fever rec she be evaluated today. If so severe pain recommend she be evaluated today at urgent care.  Otherwise plz schedule tomorrow at 10am or 9am (I may be arriving a bit late though) and may send in bactrim DS 1 tab bid #4.

## 2014-03-31 NOTE — Telephone Encounter (Signed)
Pt was seen [facility] ED on 03/10/14 for UTI; pt finished Sulfamethoxazole TMP 03/29/14; pain in rt lower back has restarted; pain level 10 +. Pt does not want to go to UC or see a different doctor on 04/01/14. Pt only will see Dr [name]. Pt request cb to see if will call in med to [place] or when can pt be seen by Dr [name].. Faxed request to [facility] med records for 03/10/14 ED visit.

## 2014-04-01 ENCOUNTER — Encounter: Payer: Self-pay | Admitting: Family Medicine

## 2014-04-01 ENCOUNTER — Ambulatory Visit (INDEPENDENT_AMBULATORY_CARE_PROVIDER_SITE_OTHER): Payer: Medicare Other | Admitting: Family Medicine

## 2014-04-01 VITALS — BP 160/90 | HR 76 | Temp 98.6°F | Wt 218.0 lb

## 2014-04-01 DIAGNOSIS — M549 Dorsalgia, unspecified: Secondary | ICD-10-CM

## 2014-04-01 DIAGNOSIS — M5442 Lumbago with sciatica, left side: Secondary | ICD-10-CM | POA: Insufficient documentation

## 2014-04-01 LAB — POCT URINALYSIS DIPSTICK
Bilirubin, UA: NEGATIVE
Glucose, UA: NEGATIVE
Ketones, UA: NEGATIVE
Leukocytes, UA: NEGATIVE
Nitrite, UA: NEGATIVE
PROTEIN UA: NEGATIVE
RBC UA: NEGATIVE
SPEC GRAV UA: 1.025
Urobilinogen, UA: 0.2
pH, UA: 6

## 2014-04-01 MED ORDER — HYDROCODONE-ACETAMINOPHEN 5-325 MG PO TABS: 1.0000 | ORAL_TABLET | Freq: Three times a day (TID) | ORAL | Status: DC | PRN

## 2014-04-01 MED ORDER — DEXAMETHASONE SODIUM PHOSPHATE 10 MG/ML IJ SOLN
10.0000 mg | Freq: Once | INTRAMUSCULAR | Status: AC
  Administered 2014-04-01: 10 mg via INTRAMUSCULAR

## 2014-04-01 NOTE — Assessment & Plan Note (Signed)
Records reviewed from ER. CT showing DDD of thoracolumbar spine with disc space flattening - anticipate current pain is referred from possible HNP. Treat with dexamethasone IM 10mg   Pt has intolerance to NSAIDs - will treat with tylenol 500mg  tid with vicodin for breakthrough pain. If no improvement with this, consider further imaging dedicated to lumbar spine. No rash to suggest PHN, not consistent today with UTI given no UTI sxs. UCx in hospital no growth. Will check UA given abornmal CT results. Red flags to seek urgent care discussed. Pt will update [place] with steroid shot effect - discussed limiting carbs and watching glucose control after IM steroid. Pt agrees with plan.

## 2014-04-01 NOTE — Progress Notes (Signed)
BP 160/90 mmHg  Pulse 76  Temp(Src) 98.6 F (37 C) (Oral)  Wt 218 lb (98.884 kg)   CC: f/u ER  Subjective:    Patient ID: Kristen [name], female    DOB: [birth year], 73 y.o.   MRN: [id number]  HPI: Kristen [name] is a 73 y.o. female presenting on 04/01/2014 for Back Pain and Neck Pain   bp elevated today but pt in pain.  Recent eval at [facility] ER 03/10/2014 with dx UTI - records reviewed. L flank pain with radiation to L abd, dx with focal pyelonephritis treated with levaquin x1 along with 7d bactrim course as well as percocet and zofran. Did not f/u with PCP as advised. UCx no growth. WBC 12.8, Hgb 12.5, plt 253, glu 141, Cr 1.34, BUN 20, UA WNL.  CXR - low grade compensated CHF without PNA evidence CT abd/pelvis with contrast - subtle small finding of upper pole L kidney nephritis, R kidney lipoma, no kidney stone or hydronephrosis. +DDD thoracolumbar spine with multilevel disc space flattening with vacuum  disc phenomenon Lab Results  Component Value Date   CREATININE 1.1 12/08/2013   She felt well while taking antibiotics. Percocet causes sedation but did help tightness. A few days after finishing antibiotics started feeling ill. Describes "pinched nerve" in L flank worse with any movement. Endorses tightness and pain in left neck as well as left calf pain. Denies back pain at all. Endorses L flank tightness.  Has very difficult time describing pain but endorses severe tightness "feels like a muscle tightening, no pain".   No fevers, chills, abd pain, nausea/vomiting. No urinary symptoms - denies dysuria, urgency, frequency, hematuria. No bowel/bladder accidents, no numbness  Lab Results  Component Value Date   HGBA1C 7.6* 12/08/2013   Relevant past medical, surgical, family and social history reviewed and updated as indicated. Interim medical history since our last visit reviewed. Allergies and medications reviewed and updated.  Current Outpatient Prescriptions on File Prior to  Visit  Medication Sig  . amLODipine (NORVASC) 5 MG tablet Take 1 tablet (5 mg total) by mouth daily.  Marland Kitchen aspirin (ASPIR-81) 81 MG EC tablet Take 81 mg by mouth daily.    . Blood Glucose Monitoring Suppl (ONE TOUCH ULTRA SYSTEM KIT) W/DEVICE KIT 1 kit by Does not apply route once.  Marland Kitchen glimepiride (AMARYL) 2 MG tablet Take 1 tablet (2 mg total) by mouth daily with breakfast.  . glucose blood test strip 250.02 Use as instructed  . losartan (COZAAR) 100 MG tablet Take 1 tablet (100 mg total) by mouth daily.  . Multiple Vitamins-Iron (MULTIVITAMIN/IRON PO) Take 1 tablet by mouth daily.  . nitroGLYCERIN (NITROSTAT) 0.4 MG SL tablet Place 0.4 mg under the tongue every 5 (five) minutes as needed for chest pain.  . simvastatin (ZOCOR) 20 MG tablet Take 1 tablet (20 mg total) by mouth at bedtime.   No current facility-administered medications on file prior to visit.    Review of Systems Per HPI unless specifically indicated above     Objective:    BP 160/90 mmHg  Pulse 76  Temp(Src) 98.6 F (37 C) (Oral)  Wt 218 lb (98.884 kg)  Wt Readings from Last 3 Encounters:  04/01/14 218 lb (98.884 kg)  01/27/14 223 lb (101.152 kg)  12/11/13 221 lb 8 oz (100.472 kg)    Physical Exam  Constitutional: She appears well-developed and well-nourished. No distress.  Obese, in discomfort  HENT:  Mouth/Throat: Oropharynx is clear and moist. No oropharyngeal  exudate.  Abdominal: Soft. Bowel sounds are normal. She exhibits no distension and no mass. There is no tenderness. There is no rebound and no guarding.  Musculoskeletal: She exhibits no edema.  2+ DP bilaterally without palpable cords Unable to get on exam table 2/2 discomfort. No pain midline spine No paraspinous mm tenderness + seated SLR on left No pain with int/ext rotation at hip. No pain at SIJ, GTB or sciatic notch bilaterally. Tender to palpation lateral hip but not at bursa  Skin: Skin is warm and dry. No rash noted.  No vesicular rash  present  Nursing note and vitals reviewed.  Results for orders placed or performed in visit on 12/11/13  HM DIABETES EYE EXAM  Result Value Ref Range   HM Diabetic Eye Exam No Retinopathy No Retinopathy      Assessment & Plan:   Problem List Items Addressed This Visit    Acute left-sided back pain with sciatica - Primary    Records reviewed from ER. CT showing DDD of thoracolumbar spine with disc space flattening - anticipate current pain is referred from possible HNP. Treat with dexamethasone IM 66m  Pt has intolerance to NSAIDs - will treat with tylenol 5038mtid with vicodin for breakthrough pain. If no improvement with this, consider further imaging dedicated to lumbar spine. No rash to suggest PHN, not consistent today with UTI given no UTI sxs. UCx in hospital no growth. Will check UA given abornmal CT results. Red flags to seek urgent care discussed. Pt will update [place] steroid shot effect - discussed limiting carbs and watching glucose control after IM steroid. Pt agrees with plan.    Relevant Medications      HYDROcodone-acetaminophen (NORCO/VICODIN) 5-325 MG per tablet       Follow up plan: Return if symptoms worsen or fail to improve.

## 2014-04-01 NOTE — Progress Notes (Signed)
Pre visit review using our clinic review tool, if applicable. No additional management support is needed unless otherwise documented below in the visit note. 

## 2014-04-01 NOTE — Patient Instructions (Signed)
I think this is coming from possibly a pinched nerve in the back. Urine checked today. Steroid shot today, then may start aleve OTC 220mg  twice daily with food for 5 days then as needed. May use vicodin for breakthrough pain. Update me if not improving as expected or any woresning.

## 2014-04-01 NOTE — Addendum Note (Signed)
Addended by: Royann Shivers A on: 04/01/2014 11:03 AM   Modules accepted: Orders

## 2014-04-08 ENCOUNTER — Ambulatory Visit: Payer: Medicare Other | Admitting: Cardiovascular Disease

## 2014-04-21 ENCOUNTER — Encounter: Payer: Self-pay | Admitting: Family Medicine

## 2014-04-21 ENCOUNTER — Ambulatory Visit: Payer: Medicare Other | Admitting: Family Medicine

## 2014-04-21 ENCOUNTER — Ambulatory Visit (INDEPENDENT_AMBULATORY_CARE_PROVIDER_SITE_OTHER): Payer: Medicare Other | Admitting: Family Medicine

## 2014-04-21 VITALS — BP 148/84 | HR 76 | Temp 98.2°F | Wt 218.5 lb

## 2014-04-21 DIAGNOSIS — E1165 Type 2 diabetes mellitus with hyperglycemia: Secondary | ICD-10-CM

## 2014-04-21 DIAGNOSIS — E785 Hyperlipidemia, unspecified: Secondary | ICD-10-CM

## 2014-04-21 DIAGNOSIS — Z23 Encounter for immunization: Secondary | ICD-10-CM

## 2014-04-21 DIAGNOSIS — M5442 Lumbago with sciatica, left side: Secondary | ICD-10-CM

## 2014-04-21 DIAGNOSIS — IMO0002 Reserved for concepts with insufficient information to code with codable children: Secondary | ICD-10-CM

## 2014-04-21 DIAGNOSIS — I10 Essential (primary) hypertension: Secondary | ICD-10-CM

## 2014-04-21 DIAGNOSIS — M1712 Unilateral primary osteoarthritis, left knee: Secondary | ICD-10-CM

## 2014-04-21 NOTE — Addendum Note (Signed)
Addended by: Royann Shivers A on: 04/21/2014 09:45 AM   Modules accepted: Orders

## 2014-04-21 NOTE — Patient Instructions (Addendum)
Return in 4 months for follow up, sooner if needed. Blood work then.

## 2014-04-21 NOTE — Progress Notes (Signed)
BP 148/84 mmHg  Pulse 76  Temp(Src) 98.2 F (36.8 C) (Oral)  Wt 218 lb 8 oz (99.111 kg)   CC: 4 mo f/u visit  Subjective:    Patient ID: Kristen [name], female    DOB: 15-Jul-1940, 73 y.o.   MRN: [id number]  HPI: Kristen [name] is a 73 y.o. female presenting on 04/21/2014 for Follow-up   Back improving - steroid shot helped.  DM - regularly does not check sugars.  Compliant with antihyperglycemic regimen which includes: amaryl 37m.  Denies low sugars or hypoglycemic symptoms.  Denies paresthesias. Last diabetic eye exam 08/2013.  Pneumovax: not done.  Prevnar: 08/2013. Lab Results  Component Value Date   HGBA1C 7.6* 12/08/2013   Diabetic Foot Exam - Simple   No data filed    not done.   HTN - Compliant with current antihypertensive regimen of amlodipine 552mand losartan 10054maily.  Does not check blood pressures at home.  No low blood pressure readings or symptoms of dizziness/syncope.  Denies HA, vision changes, CP/tightness, SOB, leg swelling.    HLD - compliant with simvastatin without myalgias.  Relevant past medical, surgical, family and social history reviewed and updated as indicated. Interim medical history since our last visit reviewed. Allergies and medications reviewed and updated.  Current Outpatient Prescriptions on File Prior to Visit  Medication Sig  . amLODipine (NORVASC) 5 MG tablet Take 1 tablet (5 mg total) by mouth daily.  . aMarland Kitchenpirin (ASPIR-81) 81 MG EC tablet Take 81 mg by mouth daily.    . Blood Glucose Monitoring Suppl (ONE TOUCH ULTRA SYSTEM KIT) W/DEVICE KIT 1 kit by Does not apply route once.  . gMarland Kitchenimepiride (AMARYL) 2 MG tablet Take 1 tablet (2 mg total) by mouth daily with breakfast.  . glucose blood test strip 250.02 Use as instructed  . losartan (COZAAR) 100 MG tablet Take 1 tablet (100 mg total) by mouth daily.  . Multiple Vitamins-Iron (MULTIVITAMIN/IRON PO) Take 1 tablet by mouth daily.  . nitroGLYCERIN (NITROSTAT) 0.4 MG SL tablet Place  0.4 mg under the tongue every 5 (five) minutes as needed for chest pain.  . simvastatin (ZOCOR) 20 MG tablet Take 1 tablet (20 mg total) by mouth at bedtime.   No current facility-administered medications on file prior to visit.    Review of Systems Per HPI unless specifically indicated above     Objective:    BP 148/84 mmHg  Pulse 76  Temp(Src) 98.2 F (36.8 C) (Oral)  Wt 218 lb 8 oz (99.111 kg)  Wt Readings from Last 3 Encounters:  04/21/14 218 lb 8 oz (99.111 kg)  04/01/14 218 lb (98.884 kg)  01/27/14 223 lb (101.152 kg)    Physical Exam  Constitutional: She appears well-developed and well-nourished. No distress.  HENT:  Head: Normocephalic and atraumatic.  Right Ear: External ear normal.  Left Ear: External ear normal.  Nose: Nose normal.  Mouth/Throat: Oropharynx is clear and moist. No oropharyngeal exudate.  Eyes: Conjunctivae and EOM are normal. Pupils are equal, round, and reactive to light. No scleral icterus.  Neck: Normal range of motion. Neck supple.  Cardiovascular: Normal rate, regular rhythm, normal heart sounds and intact distal pulses.   No murmur heard. Pulmonary/Chest: Effort normal and breath sounds normal. No respiratory distress. She has no wheezes. She has no rales.  Musculoskeletal: She exhibits no edema.  See HPI for foot exam if done  Lymphadenopathy:    She has no cervical adenopathy.  Skin: Skin is  warm and dry. No rash noted.  Psychiatric: She has a normal mood and affect.  Nursing note and vitals reviewed.      Assessment & Plan:   Problem List Items Addressed This Visit    Primary osteoarthritis of left knee    Requests steroid shot and referral for next visit. Last shot 01/2014.    Morbid obesity    Continue to encourage weight loss through healthy diet/lifestyle changes Body mass index is 39.95 kg/(m^2).    HYPERTENSION, BENIGN    Chronic, stable. Continue regimen.    HLD (hyperlipidemia)    Chronic, continue simvastatin.    Pt declines labwork today due to "holiday eating" will recheck in 4 mo Goal LDL <70.    Diabetes type 2, uncontrolled - Primary    Does not check sugars. A1c last checked 11/2013, adequate for age. Declines labwork today. Will recheck labs in 4 mo. Pt agrees with plan.    Acute left-sided back pain with sciatica    Resolved.        Follow up plan: Return in about 4 months (around 08/21/2014), or as needed, for follow up visit.

## 2014-04-21 NOTE — Assessment & Plan Note (Signed)
Continue to encourage weight loss through healthy diet/lifestyle changes Body mass index is 39.95 kg/(m^2).

## 2014-04-21 NOTE — Assessment & Plan Note (Signed)
Requests steroid shot and referral for next visit. Last shot 01/2014.

## 2014-04-21 NOTE — Assessment & Plan Note (Signed)
Resolved

## 2014-04-21 NOTE — Assessment & Plan Note (Signed)
Chronic, stable. Continue regimen. 

## 2014-04-21 NOTE — Progress Notes (Signed)
Pre visit review using our clinic review tool, if applicable. No additional management support is needed unless otherwise documented below in the visit note. 

## 2014-04-21 NOTE — Assessment & Plan Note (Signed)
Does not check sugars. A1c last checked 11/2013, adequate for age. Declines labwork today. Will recheck labs in 4 mo. Pt agrees with plan.

## 2014-04-21 NOTE — Assessment & Plan Note (Signed)
Chronic, continue simvastatin.  Pt declines labwork today due to "holiday eating" will recheck in 4 mo Goal LDL <70.

## 2014-05-06 ENCOUNTER — Ambulatory Visit: Payer: Medicare Other | Admitting: Cardiovascular Disease

## 2014-05-25 ENCOUNTER — Ambulatory Visit: Payer: Self-pay | Admitting: Cardiovascular Disease

## 2014-05-25 ENCOUNTER — Encounter: Payer: Self-pay | Admitting: *Deleted

## 2014-06-22 ENCOUNTER — Ambulatory Visit (INDEPENDENT_AMBULATORY_CARE_PROVIDER_SITE_OTHER): Payer: Medicare Other | Admitting: Family Medicine

## 2014-06-22 ENCOUNTER — Encounter: Payer: Self-pay | Admitting: Family Medicine

## 2014-06-22 VITALS — BP 164/80 | HR 72 | Temp 97.7°F | Wt 220.5 lb

## 2014-06-22 DIAGNOSIS — M1712 Unilateral primary osteoarthritis, left knee: Secondary | ICD-10-CM | POA: Diagnosis not present

## 2014-06-22 MED ORDER — HYDROCODONE-ACETAMINOPHEN 5-325 MG PO TABS: 1.0000 | ORAL_TABLET | Freq: Three times a day (TID) | ORAL | Status: DC | PRN

## 2014-06-22 NOTE — Patient Instructions (Signed)
L knee injection performed today. May use vicodin for breakthrough pain Pass by [facility]'s office for referral to orthopedist. Good to see you today, call us with questions.

## 2014-06-22 NOTE — Assessment & Plan Note (Signed)
L knee steroid injection performed today. vicodin for breakthrough pain. Intolerance to aleve 2/2 h/o GI bleed in past. Did well with R knee replacement 15 yrs ago.  Will refer to ortho to establish care and discuss L knee replacement.  Pt agrees with plan.

## 2014-06-22 NOTE — Progress Notes (Signed)
BP 164/80 mmHg  Pulse 72  Temp(Src) 97.7 F (36.5 C) (Oral)  Wt 220 lb 8 oz (100.018 kg)   CC: L knee pain  Subjective:    Patient ID: Kristen [name], female    DOB: Aug 13, 1940, 74 y.o.   MRN: [id number]  HPI: Kristen [name] is a 74 y.o. female presenting on 06/22/2014 for Knee Pain   BP elevated, attributed to pain.  Known osteoarthritis of left knee s/p steroid injection 01/2014. This provided some relief for a few months, but recently pain has recurred and worsened over last 2-3 weeks. She was on vicodin for pain with some relief. Requests refill. Also requests referral to establish with ortho.   Known h/o knee OA. Activity level now severely limited by L knee pain and getting worse. Trouble sleeping at night, trouble walking. Uses cane when going outdoors. Takes tylenol for pain prn - to no avail. Denies redness or warmth of knee or swelling. She does have handicap placard.  H/o severe knee OA, s/p R knee replacement. She has had good result for steroid shot to right knee in the past  LEFT KNEE - COMPLETE 4+ VIEW COMPARISON: None. FINDINGS: There is tricompartmental degenerative joint disease primarily involving the medial compartment where there is more loss of joint space with sclerosis and spurring. No fracture is seen. Chondrocalcinosis is present which may indicate CPPD arthropathy. Only a small left knee joint effusion is noted. IMPRESSION: 1. Tricompartmental degenerative joint disease primarily involving the medial compartment. 2. Chondrocalcinosis may indicate CPPD arthropathy. 3. Small left knee joint effusion. Electronically Signed  By: [name]  On: 01/27/2014 12:16  Relevant past medical, surgical, family and social history reviewed and updated as indicated. Interim medical history since our last visit reviewed. Allergies and medications reviewed and updated. Current Outpatient Prescriptions on File Prior to Visit  Medication Sig  . amLODipine  (NORVASC) 5 MG tablet Take 1 tablet (5 mg total) by mouth daily.  Marland Kitchen aspirin (ASPIR-81) 81 MG EC tablet Take 81 mg by mouth daily.    . Blood Glucose Monitoring Suppl (ONE TOUCH ULTRA SYSTEM KIT) W/DEVICE KIT 1 kit by Does not apply route once.  Marland Kitchen glimepiride (AMARYL) 2 MG tablet Take 1 tablet (2 mg total) by mouth daily with breakfast.  . glucose blood test strip 250.02 Use as instructed  . losartan (COZAAR) 100 MG tablet Take 1 tablet (100 mg total) by mouth daily.  . Multiple Vitamins-Iron (MULTIVITAMIN/IRON PO) Take 1 tablet by mouth daily.  . simvastatin (ZOCOR) 20 MG tablet Take 1 tablet (20 mg total) by mouth at bedtime.  . nitroGLYCERIN (NITROSTAT) 0.4 MG SL tablet Place 0.4 mg under the tongue every 5 (five) minutes as needed for chest pain.   No current facility-administered medications on file prior to visit.    Review of Systems Per HPI unless specifically indicated above     Objective:    BP 164/80 mmHg  Pulse 72  Temp(Src) 97.7 F (36.5 C) (Oral)  Wt 220 lb 8 oz (100.018 kg)  Wt Readings from Last 3 Encounters:  06/22/14 220 lb 8 oz (100.018 kg)  04/21/14 218 lb 8 oz (99.111 kg)  04/01/14 218 lb (98.884 kg)    Physical Exam  Constitutional: She appears well-developed and well-nourished.  Body mass index is 40.32 kg/(m^2).  Morbidly obese  Musculoskeletal: She exhibits no edema.  R knee s/p replacement L knee effusion present as well as pain with extension but no significant crepitus. No  popliteal fullness  Nursing note and vitals reviewed.  L knee steroid injection: IC obtained and in chart. Landmarks palpated. Skin cleaned with alcohol wipes. Anesthesia achieved with ethyl chloride. Using lateral inferior approach with knee flexed, 1cc depo medrol 93m and 4 cc lidocaine 1% injected into knee joint using 22g 1.5 in needle. Pt tolerated well. Post procedure instructions provided.    Assessment & Plan:   Problem List Items Addressed This Visit    Primary  osteoarthritis of left knee - Primary    L knee steroid injection performed today. vicodin for breakthrough pain. Intolerance to aleve 2/2 h/o GI bleed in past. Did well with R knee replacement 15 yrs ago.  Will refer to ortho to establish care and discuss L knee replacement.  Pt agrees with plan.      Relevant Medications   HYDROcodone-acetaminophen (NORCO/VICODIN) 5-325 MG per tablet   Other Relevant Orders   Ambulatory referral to Orthopedic Surgery       Follow up plan: Return if symptoms worsen or fail to improve.

## 2014-06-22 NOTE — Progress Notes (Signed)
Pre visit review using our clinic review tool, if applicable. No additional management support is needed unless otherwise documented below in the visit note. 

## 2014-06-29 DIAGNOSIS — M25562 Pain in left knee: Secondary | ICD-10-CM | POA: Diagnosis not present

## 2014-07-10 ENCOUNTER — Other Ambulatory Visit: Payer: Self-pay | Admitting: Orthopedic Surgery

## 2014-07-24 ENCOUNTER — Encounter: Payer: Self-pay | Admitting: Cardiovascular Disease

## 2014-07-24 ENCOUNTER — Ambulatory Visit (INDEPENDENT_AMBULATORY_CARE_PROVIDER_SITE_OTHER): Payer: Medicare Other | Admitting: Cardiovascular Disease

## 2014-07-24 VITALS — BP 158/80 | HR 76 | Ht 62.0 in | Wt 217.8 lb

## 2014-07-24 DIAGNOSIS — I2510 Atherosclerotic heart disease of native coronary artery without angina pectoris: Secondary | ICD-10-CM | POA: Diagnosis not present

## 2014-07-24 DIAGNOSIS — I10 Essential (primary) hypertension: Secondary | ICD-10-CM | POA: Diagnosis not present

## 2014-07-24 DIAGNOSIS — IMO0002 Reserved for concepts with insufficient information to code with codable children: Secondary | ICD-10-CM

## 2014-07-24 DIAGNOSIS — R079 Chest pain, unspecified: Secondary | ICD-10-CM

## 2014-07-24 DIAGNOSIS — E1165 Type 2 diabetes mellitus with hyperglycemia: Secondary | ICD-10-CM

## 2014-07-24 DIAGNOSIS — E785 Hyperlipidemia, unspecified: Secondary | ICD-10-CM

## 2014-07-24 NOTE — Assessment & Plan Note (Signed)
Blood pressure is well controlled on today's visit. No changes made to the medications. 

## 2014-07-24 NOTE — Assessment & Plan Note (Signed)
No recent symptoms concerning for angina. No further testing at this time

## 2014-07-24 NOTE — Patient Instructions (Signed)
You are doing well. No medication changes were made.  Please call us if you have new issues that need to be addressed before your next appt.  Your physician wants you to follow-up in: 12 months.  You will receive a reminder letter in the mail two months in advance. If you don't receive a letter, please call our office to schedule the follow-up appointment. 

## 2014-07-24 NOTE — Assessment & Plan Note (Signed)
Currently with no symptoms of angina. No further workup at this time. Continue current medication regimen. 

## 2014-07-24 NOTE — Assessment & Plan Note (Signed)
We have encouraged continued exercise, careful diet management in an effort to lose weight. 

## 2014-07-24 NOTE — Progress Notes (Signed)
Patient ID: Kristen [name], female    DOB: Dec 29, 1940, 74 y.o.   MRN: [id number]  HPI Comments: Kristen [name] is a pleasant 74 year old woman with history of diabetes, hyperlipidemia, hypertension recently presented to the hospital 07/02/2013 with chest pain. history of obesity. She presents today for follow-up of her hypertension and previous history of chest pain  In follow-up she reports that she is feeling well. She reports blood pressures typically well controlled. Her biggest complaint is her knee. She has had total knee replacement on 1 knee, recent cortisone shot on the other knee. She has been seen [facility] orthopedics in preparation for totally replacement She denies any chest pain or shortness of breath concerning for angina Tolerating her medications without any side effects  EKG on today's visit shows T wave abnormality V3 through V6, unchanged from prior EKG  Other past medical history On her previous admission, Chest pain in the hospital was negative for MI, cardiac enzymes negative. 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 This also showed coronary calcifications Stress test showed no ischemia, ejection fraction 61% Lower extremities ultrasound showed no DVT    Allergies  Allergen Reactions  . Aleve [Naproxen Sodium] Other (See Comments)    Spits up blood  . Metformin And Related Other (See Comments)    Chills, dizziness  . Penicillins Rash    Whelps and swelling    Outpatient Encounter Prescriptions as of 07/24/2014  Medication Sig  . amLODipine (NORVASC) 5 MG tablet Take 1 tablet (5 mg total) by mouth daily.  Marland Kitchen aspirin (ASPIR-81) 81 MG EC tablet Take 81 mg by mouth daily.    . Blood Glucose Monitoring Suppl (ONE TOUCH ULTRA SYSTEM KIT) W/DEVICE KIT 1 kit by Does not apply route once.  Marland Kitchen glimepiride (AMARYL) 2 MG  tablet Take 1 tablet (2 mg total) by mouth daily with breakfast.  . glucose blood test strip 250.02 Use as instructed  . HYDROcodone-acetaminophen (NORCO/VICODIN) 5-325 MG per tablet Take 1 tablet by mouth every 8 (eight) hours as needed for moderate pain.  Marland Kitchen losartan (COZAAR) 100 MG tablet Take 1 tablet (100 mg total) by mouth daily.  . Multiple Vitamins-Iron (MULTIVITAMIN/IRON PO) Take 1 tablet by mouth daily.  . nitroGLYCERIN (NITROSTAT) 0.4 MG SL tablet Place 0.4 mg under the tongue every 5 (five) minutes as needed for chest pain.  . simvastatin (ZOCOR) 20 MG tablet Take 1 tablet (20 mg total) by mouth at bedtime.    Past Medical History  Diagnosis Date  . HTN (hypertension)   . Diabetes type 2, controlled   . HLD (hyperlipidemia)   . Morbid obesity   . Vertigo     hx. benign postitional postural  . Internal capsule hemorrhage     hx of sublacunar infarct involving the right posterior limb of the internal capsule   . Osteoarthritis     knees  . Frequent headaches   . History of pulmonary embolism 2012  . CAD (coronary artery disease)     by CT, per pt h/o MI    Past Surgical History  Procedure Laterality Date  . Total knee arthroplasty Right 1990s  . Partial hysterectomy      for fibroids, ovaries remain  . Foot surgery Right   . Cataract extraction Bilateral 2013    Social History  reports that she has never smoked. She has  never used smokeless tobacco. She reports that she drinks alcohol. She reports that she does not use illicit drugs.  Family History family history includes Cancer in her mother; Cancer (age of onset: 36) in her son; Congenital heart disease (age of onset: 51) in her son; Diabetes in her father; Hypertension in her father.   Review of Systems  Constitutional: Negative.   Respiratory: Negative.   Cardiovascular: Negative.   Gastrointestinal: Negative.   Musculoskeletal: Positive for arthralgias.  Skin: Negative.   Neurological: Negative.    Hematological: Negative.   Psychiatric/Behavioral: Negative.   All other systems reviewed and are negative.   BP 158/80 mmHg  Pulse 76  Ht '5\' 2"'  (1.575 m)  Wt 217 lb 12 oz (98.771 kg)  BMI 39.82 kg/m2  Physical Exam  Constitutional: She is oriented to person, place, and time. She appears well-developed and well-nourished.  Obese  HENT:  Head: Normocephalic.  Nose: Nose normal.  Mouth/Throat: Oropharynx is clear and moist.  Eyes: Conjunctivae are normal. Pupils are equal, round, and reactive to light.  Neck: Normal range of motion. Neck supple. No JVD present.  Cardiovascular: Normal rate, regular rhythm, S1 normal, S2 normal, normal heart sounds and intact distal pulses.  Exam reveals no gallop and no friction rub.   No murmur heard. Pulmonary/Chest: Effort normal and breath sounds normal. No respiratory distress. She has no wheezes. She has no rales. She exhibits no tenderness.  Abdominal: Soft. Bowel sounds are normal. She exhibits no distension. There is no tenderness.  Musculoskeletal: Normal range of motion. She exhibits no edema or tenderness.  Lymphadenopathy:    She has no cervical adenopathy.  Neurological: She is alert and oriented to person, place, and time. Coordination normal.  Skin: Skin is warm and dry. No rash noted. No erythema.  Psychiatric: She has a normal mood and affect. Her behavior is normal. Judgment and thought content normal.    Assessment and Plan  Nursing note and vitals reviewed.

## 2014-07-24 NOTE — Assessment & Plan Note (Signed)
Poor diet, drinks soda  Recommended she change her diet. Hemoglobin A1c 7.6

## 2014-07-24 NOTE — Assessment & Plan Note (Signed)
Cholesterol is at goal on the current lipid regimen. No changes to the medications were made.  

## 2014-07-30 ENCOUNTER — Telehealth: Payer: Self-pay | Admitting: Cardiovascular Disease

## 2014-07-30 NOTE — Telephone Encounter (Signed)
Patient said Dr. Rockey Situ told her to call this week and see if Blood Pressure cuffs were delivered.  Patient stated Dr. Rockey Situ said they would be delivered this week and she could come pick one up.  Patient was notified that we have not received any at this time and to call back next week.  Patient was also advised that [facility] sells these cuffs.  Patient would rather have free one from our office.  Patient will call back next week.    Patient also needs cardiac clearance sent to ortho surgeon in [place]

## 2014-08-12 ENCOUNTER — Other Ambulatory Visit: Payer: Medicare Other

## 2014-08-15 NOTE — Discharge Summary (Signed)
PATIENT NAME:  Kristen [name], Kristen [name] MR#:  [id number] DATE OF BIRTH:  29-Sep-1940  DATE OF ADMISSION:  07/02/2013 DATE OF DISCHARGE:  07/03/2013  PRESENTING COMPLAINT: Chest pain.   DISCHARGE DIAGNOSES:  1.  Chest pain, appears atypical. Work-up negative in the form of Myoview stress test which was essentially negative with ejection fraction of 61%.  2.  Morbid obesity.  3.  Type 2 diabetes.  4.  Hypertension.   CONDITION ON DISCHARGE: Fair.   CODE STATUS: Full code.   PROCEDURE: Myoview stress test essentially normal with ejection fraction of 61%.   MEDICATIONS:  1.  Simvastatin 20 mg daily.  2.  Hydrochlorothiazide/losartan 25/100 p.o. daily.  3.  Metformin 500 b.i.d.  4.  Glimepiride 1 mg p.o. daily.  5.  Aspirin 81 mg daily.  6.  Lasix 20 mg daily for 5 more days for ankle edema.  7.  Magnesium oxide 400 mg daily.  8.  Nitroglycerin 0.4 mg sublingual as needed.   DIET: Low sodium, carbohydrate,  ADA 1800 calorie diet.   FOLLOWUP: 1.   With Dr. [name] with [facility].  2.  With Valeria Batman, PA with [facility].  CARDIOLOGY CONSULTATION: Minna Merritts, MD  DIAGNOSTIC DATA:  1.  Echo Doppler showed EF of 55% to 60%. Normal right ventricular systolic function and size. Normal left ventricular systolic function and size.  2.  Lipid profile within normal limits.  3.  Cardiac enzymes x 3 negative.  4.  Ultrasound Doppler lower extremity within normal limits.  5.  CT angiography; no evidence of pulmonary embolism, hepatic steatosis. Multilevel degenerative changes including posterior disk osteophyte complex at T5-6 resulting in central canal narrowing. 6.   CBC within normal limits.  7.   PT-INR is 13.7 and 1.1.   BRIEF SUMMARY OF HOSPITAL COURSE: Kristen [name] is a 74 year old African American female with history of hypertension and hyperlipidemia, comes into the Emergency Room with:  1.  Chest pain. The patient was admitted with chest pain, which appears  atypical. The patient does have a history of MI in the past. Her EKG did have some anterolateral T wave inversions; however, these were not new.  She was admitted on telemetry floor where she remained chest pain-free. Aspirin and nitroglycerin were given. She was ruled out with 3 sets of negative cardiac enzymes and Myoview stress test results were essentially negative. She was seen by Dr. Rockey Situ. She will follow up with Dr. Rockey Situ as outpatient.  2.  Shortness of breath on exertion, right leg new DVT. CT of the chest was negative for PE. She was given low-dose Lasix for her leg edema and shortness of breath.  3.  Hypokalemia, repleted.  4.  History of DVT and PE in the past. Treat with Lasix at least 6 months with Coumadin. All her hypercoagulable work-up has been negative per records.  5.  Type 2 diabetes. Resume p.o. medications.  6.  Thoracic spinal stenosis. Seems to be chronic. Could be contributing a lot to her symptoms and her leg pain. The patient is recommended for neurosurgical or orthopedic evaluation as outpatient. Will defer primary care to assist with outpatient referral to above speciality. 7.  Elevated hypertension. Hydrochlorothiazide and losartan were continued.   Hospital stay otherwise remained stable. The patient was advised 2-gram sodium diet. She is chest pain-free and is discharged in a stable condition.   TIME SPENT:  Forty minutes.  ____________________________ Hart Rochester Posey Pronto, MD sap:am D: 07/04/2013 06:48:19 ET T: 07/04/2013  10:19:25 ET JOB#: [id number]  cc: Nerissa Constantin A. Posey Pronto, MD, <Dictator> Roger A. Arguello, PA-C Minna Merritts, MD   Ilda Basset MD ELECTRONICALLY SIGNED 07/11/2013 10:42

## 2014-08-15 NOTE — H&P (Signed)
PATIENT NAME:  Kristen [name], Kristen [name] MR#:  [id number] DATE OF BIRTH:  [birth year]  DATE OF ADMISSION:  07/02/2013  PRIMARY CARE PHYSICIAN:  [name], MD, at [facility].   REQUESTING PHYSICIAN: Algis Liming. Jimmye Norman, MD  REQUESTING PHYSICIAN:  Dr. Owens Shark.    CHIEF COMPLAINT:  Shortness of breath.   HISTORY OF PRESENT ILLNESS: The patient is a 74 year old female with a known history of hypertension, diabetes, CVA, is being admitted for chest pain rule out and shortness of breath on exertion, for possible heart failure. The patient reports having history of PE and DVT for which she was treated with Coumadin for about 6 months and finished treatment by Dr. Inez Pilgrim about 2 years ago. She started noticing increasing swelling in her right lower extremity and also noticed substernal intermittent sharp chest pain lasting for a few seconds to a few minutes associated with right arm pressure and neck discomfort, started about 3 days ago. She also reports some exertional shortness of breath which has been chronic and has been getting worse. Considering her history of DVT and PE, she underwent CT chest which was negative. She also had lower extremity Doppler which was negative for any DVT and she is being admitted for further evaluation and management.   PAST MEDICAL HISTORY: 1.  Hypertension.  2.  Hyperlipidemia.  3.  Diabetes.  4.  History of CVA. 5.  History of PE and DVT. 6.  Obesity. 7.  History of benign positional vertigo.  8.  History of internal capsular hemorrhage with sublacunar infarct involving the right posterior limb of the internal capsule.  9.  History of frequent headaches.  10.  History of non-ST-elevation MI.    PAST SURGICAL HISTORY: 1.  Total knee arthroplasty.  2.  Partial hysterectomy.  3.  Foot surgery.  4.  Cataract extraction.   FAMILY HISTORY: Mother with bone cancer. Father had diabetes, hypertension. Son has congenital heart disease and lung cancer.    ALLERGIES: PENICILLIN.   SOCIAL HISTORY: She resides in [place].  No tobacco, occasional alcohol, denies any illicit drugs of abuse. She is retired for about 5 years.   MEDICATIONS AT HOME:   1.  Simvastatin 20 mg p.o. daily.  2.  Metformin 500 mg p.o. b.i.d.  3.  HCTZ/losartan 25/100 mg 1 tablet p.o. daily. Of. 4.  Glimepiride 1 mg p.o. daily.  5.  Aspirin 81 mg.   REVIEW OF SYSTEMS: CONSTITUTIONAL: No fever, fatigue, weakness.  EYES:  No blurred or double vision.  HEENT: No tinnitus or ear pain. Positive for headache.  NECK: Supple. No restriction.  RESPIRATION: No cough. Positive for shortness of breath on exertion. No wheezing. No fever.  CARDIOVASCULAR: Positive for chest pain, no palpitations. Positive shortness of breath on exertion. Also has some lower extremity edema, more on the right.   GASTROINTESTINAL:  No nausea, vomiting, diarrhea.   GENITOURINARY:  No dysuria or hematuria. ENDOCRINE:  No polyuria or nocturia HEMATOLOGY:  No anemia or easy bruising. SKIN:  No rash or lesion.   MUSCULOSKELETAL: Positive for arthritis.  NEUROLOGIC: No tingling, numbness, weakness.  PSYCHIATRIC: No history of anxiety or depression.   PHYSICAL EXAMINATION: VITAL SIGNS: Temperature 98, heart rate 66 per minute, respirations 18 per minute, blood pressure 196/78 mmHg, she is saturating 97% on room air.  GENERAL: The patient is a 74 year old female lying in the bed comfortably, not in acute distress.  EYES: Pupils equal, round, equal to light and accommodation. No scleral icterus. Extraocular muscles intact.  HEENT: Head atraumatic, normocephalic. Oropharynx and nasopharynx clear.  NECK: Supple. No jugular venous distention. No thyromegaly, no tenderness.  LUNGS: Clear to auscultation bilaterally. No wheezing, rales, rhonchi or crepitation.  ABDOMEN: Soft, nontender, nondistended. Bowel sounds present. No organomegaly or mass.  EXTREMITIES: She has 2+ pedal edema on the right lower  extremity, none on the left.  The edema on the  right lower extremity is extending up to thigh with no erythema.   SKIN: No obvious rash or lesions. NEUROLOGIC: Cranial nerves II through XII intact. Muscle strength 5 out of 5 in all extremities. Sensation intact.  PSYCHIATRIC: The patient is alert and oriented x 3.   LABORATORY, DIAGNOSTIC AND RADIOLOGICAL DATA:   1.  Laboratory panel:  Normal BMP except potassium of 3.1. Normal first set of troponins. Normal CBC. Normal coagulation panel except. D-dimer of 2.73.  2.  Chest x-ray in the ED showed no acute cardiopulmonary disease.  3.  CT scan of the chest on 11th of March showed no acute PE, possible bronchitis. Hepatic steatosis. Multilevel DJD including a posterior disk osteophyte complex at the T5-T6 resulting in severe central canal stenosis/narrowing.  4.  Lower extremity Doppler on the right lower extremity showed no DVT.    IMPRESSION AND PLAN: 1.  Atypical chest pain but considering her cardiac history we will go ahead and rule her out with troponin. Her EKG does have some anterolateral T wave inversions, ST depressions are not new.  Will rule her out with troponin and get Myoview  in the morning. Cardiology is in agreement. We will get cardiac consult. Obtain 2-D echocardiogram. Start her on aspirin and statin along with nitroglycerin.  2.  Shortness of breath on exertion. Will get a 2-D echo for further evaluation.  She does have lower extremity edema, mainly in the right lower extremity. Add low-dose Lasix.  Get cardiology consult.   3.  Hypokalemia. We will replete and recheck. Check magnesium.  4.  History of deep vein thrombosis/pulmonary embolism. Had been treated for at least 6 months of Coumadin. All her hypercoagulable workup had been negative per records. For now, we will start her on prophylactic Lovenox and thromboembolic disease hose.  5.  Diabetes. Will check hemoglobin A1c. 6.  Hypertension. Elevated. Will increase the dose  of hydrochlorothiazide and angiotensin receptor blocker, add hydralazine and adjust as needed.  7.  Thoracic spinal stenosis seemed to be chronic, could be contributing to a lot of her symptoms  also.  Will require outpatient orthopedic or neurosurgical evaluation.   TOTAL TIME TAKING CARE OF THIS PATIENT: 45 minutes.     ____________________________ Lucina Mellow. Manuella Ghazi, MD vss:cs D: 07/02/2013 17:35:33 ET T: 07/02/2013 18:03:06 ET JOB#: [id number]  cc: Roshaunda Starkey S. Manuella Ghazi, MD, <Dictator> [name], MD  Lucina Mellow [facility] MD ELECTRONICALLY SIGNED 07/04/2013 16:28
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
## 2014-08-19 ENCOUNTER — Telehealth: Payer: Self-pay | Admitting: Family Medicine

## 2014-08-19 ENCOUNTER — Ambulatory Visit: Payer: Medicare Other | Admitting: Family Medicine

## 2014-08-19 DIAGNOSIS — Z0289 Encounter for other administrative examinations: Secondary | ICD-10-CM

## 2014-08-19 NOTE — Telephone Encounter (Signed)
Called to r/s, only number has been disconnected

## 2014-08-19 NOTE — Telephone Encounter (Signed)
Patient did not come in for their appointment today for 4 month follow up.  Please let me know if patient needs to be contacted immediately for follow up or no follow up needed. °

## 2014-08-19 NOTE — Telephone Encounter (Signed)
Yes plz schedule DM f/u

## 2014-09-04 ENCOUNTER — Other Ambulatory Visit: Payer: Self-pay | Admitting: Family Medicine

## 2014-09-10 ENCOUNTER — Encounter (HOSPITAL_COMMUNITY)
Admission: RE | Admit: 2014-09-10 | Discharge: 2014-09-10 | Disposition: A | Discharge status: Home / Self Care | Payer: Medicare Other | Source: Ambulatory Visit | Attending: Orthopedic Surgery | Admitting: Orthopedic Surgery

## 2014-09-10 ENCOUNTER — Encounter (HOSPITAL_COMMUNITY): Payer: Self-pay

## 2014-09-10 DIAGNOSIS — R278 Other lack of coordination: Secondary | ICD-10-CM | POA: Diagnosis not present

## 2014-09-10 DIAGNOSIS — Z86711 Personal history of pulmonary embolism: Secondary | ICD-10-CM | POA: Diagnosis not present

## 2014-09-10 DIAGNOSIS — Z886 Allergy status to analgesic agent status: Secondary | ICD-10-CM | POA: Diagnosis not present

## 2014-09-10 DIAGNOSIS — I2510 Atherosclerotic heart disease of native coronary artery without angina pectoris: Secondary | ICD-10-CM | POA: Diagnosis not present

## 2014-09-10 DIAGNOSIS — S78112A Complete traumatic amputation at level between left hip and knee, initial encounter: Secondary | ICD-10-CM | POA: Diagnosis not present

## 2014-09-10 DIAGNOSIS — M13862 Other specified arthritis, left knee: Secondary | ICD-10-CM | POA: Diagnosis not present

## 2014-09-10 DIAGNOSIS — Z833 Family history of diabetes mellitus: Secondary | ICD-10-CM | POA: Diagnosis not present

## 2014-09-10 DIAGNOSIS — Z880 Allergy status to penicillin: Secondary | ICD-10-CM | POA: Diagnosis not present

## 2014-09-10 DIAGNOSIS — E119 Type 2 diabetes mellitus without complications: Secondary | ICD-10-CM | POA: Diagnosis not present

## 2014-09-10 DIAGNOSIS — Z8249 Family history of ischemic heart disease and other diseases of the circulatory system: Secondary | ICD-10-CM | POA: Diagnosis not present

## 2014-09-10 DIAGNOSIS — Z96652 Presence of left artificial knee joint: Secondary | ICD-10-CM | POA: Diagnosis not present

## 2014-09-10 DIAGNOSIS — I10 Essential (primary) hypertension: Secondary | ICD-10-CM | POA: Diagnosis not present

## 2014-09-10 DIAGNOSIS — Z96651 Presence of right artificial knee joint: Secondary | ICD-10-CM | POA: Diagnosis not present

## 2014-09-10 DIAGNOSIS — Z8673 Personal history of transient ischemic attack (TIA), and cerebral infarction without residual deficits: Secondary | ICD-10-CM | POA: Diagnosis not present

## 2014-09-10 DIAGNOSIS — I252 Old myocardial infarction: Secondary | ICD-10-CM | POA: Diagnosis not present

## 2014-09-10 DIAGNOSIS — Z79899 Other long term (current) drug therapy: Secondary | ICD-10-CM | POA: Diagnosis not present

## 2014-09-10 DIAGNOSIS — I259 Chronic ischemic heart disease, unspecified: Secondary | ICD-10-CM | POA: Diagnosis not present

## 2014-09-10 DIAGNOSIS — R2681 Unsteadiness on feet: Secondary | ICD-10-CM | POA: Diagnosis not present

## 2014-09-10 DIAGNOSIS — Z471 Aftercare following joint replacement surgery: Secondary | ICD-10-CM | POA: Diagnosis not present

## 2014-09-10 DIAGNOSIS — M1712 Unilateral primary osteoarthritis, left knee: Secondary | ICD-10-CM | POA: Diagnosis not present

## 2014-09-10 DIAGNOSIS — E785 Hyperlipidemia, unspecified: Secondary | ICD-10-CM | POA: Diagnosis not present

## 2014-09-10 DIAGNOSIS — R0602 Shortness of breath: Secondary | ICD-10-CM | POA: Diagnosis not present

## 2014-09-10 DIAGNOSIS — Z888 Allergy status to other drugs, medicaments and biological substances status: Secondary | ICD-10-CM | POA: Diagnosis not present

## 2014-09-10 DIAGNOSIS — M179 Osteoarthritis of knee, unspecified: Secondary | ICD-10-CM | POA: Diagnosis not present

## 2014-09-10 DIAGNOSIS — I639 Cerebral infarction, unspecified: Secondary | ICD-10-CM | POA: Diagnosis not present

## 2014-09-10 DIAGNOSIS — Z01812 Encounter for preprocedural laboratory examination: Secondary | ICD-10-CM | POA: Insufficient documentation

## 2014-09-10 DIAGNOSIS — Z7902 Long term (current) use of antithrombotics/antiplatelets: Secondary | ICD-10-CM | POA: Diagnosis not present

## 2014-09-10 HISTORY — DX: Acute myocardial infarction, unspecified: I21.9

## 2014-09-10 HISTORY — DX: Cerebral infarction, unspecified: I63.9

## 2014-09-10 HISTORY — DX: Sleep apnea, unspecified: G47.30

## 2014-09-10 LAB — BASIC METABOLIC PANEL
ANION GAP: 9 (ref 5–15)
BUN: 17 mg/dL (ref 6–20)
CALCIUM: 9.3 mg/dL (ref 8.9–10.3)
CO2: 30 mmol/L (ref 22–32)
CREATININE: 1.25 mg/dL — AB (ref 0.44–1.00)
Chloride: 103 mmol/L (ref 101–111)
GFR, EST AFRICAN AMERICAN: 48 mL/min — AB (ref >60)
GFR, EST NON AFRICAN AMERICAN: 42 mL/min — AB (ref >60)
Glucose, Bld: 103 mg/dL — ABNORMAL HIGH (ref 65–99)
Potassium: 3.3 mmol/L — ABNORMAL LOW (ref 3.5–5.1)
Sodium: 142 mmol/L (ref 135–145)

## 2014-09-10 LAB — CBC
HCT: 37.5 % (ref 36.0–46.0)
Hemoglobin: 11.8 g/dL — ABNORMAL LOW (ref 12.0–15.0)
MCH: 26.8 pg (ref 26.0–34.0)
MCHC: 31.5 g/dL (ref 30.0–36.0)
MCV: 85 fL (ref 78.0–100.0)
PLATELETS: 226 10*3/uL (ref 150–400)
RBC: 4.41 MIL/uL (ref 3.87–5.11)
RDW: 15.3 % (ref 11.5–15.5)
WBC: 8.1 10*3/uL (ref 4.0–10.5)

## 2014-09-10 LAB — SURGICAL PCR SCREEN
MRSA, PCR: NEGATIVE
Staphylococcus aureus: NEGATIVE

## 2014-09-10 LAB — GLUCOSE, CAPILLARY: Glucose-Capillary: 172 mg/dL — ABNORMAL HIGH (ref 65–99)

## 2014-09-10 NOTE — Pre-Procedure Instructions (Signed)
Kristen [name]  09/10/2014   Your procedure is scheduled on:  09/22/2014  Report to [facility] Admitting at 5:30 AM.  Call this number if you have problems the morning of surgery: [phone]   Remember:   Do not eat food or drink liquids after midnight.  On Monday night    Take these medicines the morning of surgery with A SIP OF WATER: AMLODIPINE & YOU MAY take the pain medicine if you need it   Do not wear jewelry, make-up or nail polish.   Do not wear lotions, powders, or perfumes. You may wear deodorant.   Do not shave 48 hours prior to surgery.    Do not bring valuables to the hospital.  [facility] is not responsible  for any belongings or valuables.               Contacts, dentures or bridgework may not be worn into surgery.   Leave suitcase in the car. After surgery it may be brought to your room.   For patients admitted to the hospital, discharge time is determined by your     treatment team.               Patients discharged the day of surgery will not be allowed to drive  home.  Name and phone number of your driver: /with family  Special Instructions: Special Instructions: [facility] - Preparing for Surgery  Before surgery, you can play an important role.  Because skin is not sterile, your skin needs to be as free of germs as possible.  You can reduce the number of germs on you skin by washing with CHG (chlorahexidine gluconate) soap before surgery.  CHG is an antiseptic cleaner which kills germs and bonds with the skin to continue killing germs even after washing.  Please DO NOT use if you have an allergy to CHG or antibacterial soaps.  If your skin becomes reddened/irritated stop using the CHG and inform your nurse when you arrive at Short Stay.  Do not shave (including legs and underarms) for at least 48 hours prior to the first CHG shower.  You may shave your face.  Please follow these instructions carefully:   1.  Shower with CHG Soap the night  before surgery and the  morning of Surgery.  2.  If you choose to wash your hair, wash your hair first as usual with your  normal shampoo.  3.  After you shampoo, rinse your hair and body thoroughly to remove the  Shampoo.  4.  Use CHG as you would any other liquid soap.  You can apply chg directly to the skin and wash gently with scrungie or a clean washcloth.  5.  Apply the CHG Soap to your body ONLY FROM THE NECK DOWN.    Do not use on open wounds or open sores.  Avoid contact with your eyes, ears, mouth and genitals (private parts).  Wash genitals (private parts)   with your normal soap.  6.  Wash thoroughly, paying special attention to the area where your surgery will be performed.  7.  Thoroughly rinse your body with warm water from the neck down.  8.  DO NOT shower/wash with your normal soap after using and rinsing off   the CHG Soap.  9.  Pat yourself dry with a clean towel.            10.  Wear clean pajamas.  11.  Place clean sheets on your bed the night of your first shower and do not sleep with pets.  Day of Surgery  Do not apply any lotions/deodorants the morning of surgery.  Please wear clean clothes to the hospital/surgery center.   Please read over the following fact sheets that you were given: Pain Booklet, Coughing and Deep Breathing, Blood Transfusion Information, Total Joint Packet, MRSA Information and Surgical Site Infection Prevention

## 2014-09-11 LAB — HEMOGLOBIN A1C
Hgb A1c MFr Bld: 8.3 % — ABNORMAL HIGH (ref 4.8–5.6)
Mean Plasma Glucose: 192 mg/dL

## 2014-09-11 NOTE — Progress Notes (Signed)
Anesthesia Chart Review:  Pt is 74 year old female scheduled for L total knee arthroplasty on 09/22/2014 with Dr. Mardelle Matte.   Cardiologist is Dr. Rockey Situ, last office visit 07/24/2014.   PMH includes:  HTN, CAD, MI (2012), stroke, PE (2012), DM, hyperlipidemia, OSA. Never smoker. BMI 40.   Preoperative labs reviewed.  Glucose 103; HgbA1c is 8.3.   Chest x-ray 03/10/2014 reviewed. Low-grade compensated CHF. There is no evidence of pneumonia.   EKG 07/24/2014: Sinus rhythm. Negative T waves- anterior ischemia. No change from previous per Dr. Donivan Scull interpretation.   Nuclear stress test 07/03/2013: 1. Pharmacological myocardial perfusion study with no signficant ischemia.  2. Attenuation corrected images were used given significant GI uptake artifact on nonattenuated corrected images 3. No significant wall motion abnormality noted.  4. Nonspecific ST and T wave ABN at rest and stress. 5. The estimated EF is 61% 6. The LV global function was normal.  7. There are no EKG changes concerning for ischemia.  8. There is significant GI uptake artifact noted on this study. 9. OVerall, this is a low risk scan.  10. Clinical correlation recommended.   Echo 07/03/2013: 1. LV EF, by visual estimation, is 55-60% 2. Normal global LV systolic function 3. Impaired relaxation pattern of LV diastolic filling.  4. Mild LV hypertrophy.  5. Normal RV size and systolic function 6. Mild mitral valve regurgitation 7. Normal RVSP.   Pt has cardiac clearance from Dr. Rockey Situ in letter entry by Dede Query, RN dated 07/24/2014.   If no changes, I anticipate pt can proceed with surgery as scheduled.   Willeen Cass, FNP-BC [facility]/Anesthesiology Phone: [phone] 09/11/2014 1:32 PM

## 2014-09-21 MED ORDER — CEFAZOLIN SODIUM-DEXTROSE 2-3 GM-% IV SOLR
2.0000 g | INTRAVENOUS | Status: AC
  Administered 2014-09-22: 2 g via INTRAVENOUS
  Filled 2014-09-21: qty 50

## 2014-09-22 ENCOUNTER — Inpatient Hospital Stay (HOSPITAL_COMMUNITY): Payer: Medicare Other | Admitting: Emergency Medicine

## 2014-09-22 ENCOUNTER — Inpatient Hospital Stay (HOSPITAL_COMMUNITY): Payer: Medicare Other | Admitting: Anesthesiology

## 2014-09-22 ENCOUNTER — Encounter (HOSPITAL_COMMUNITY): Payer: Self-pay | Admitting: Certified Registered Nurse Anesthetist

## 2014-09-22 ENCOUNTER — Inpatient Hospital Stay (HOSPITAL_COMMUNITY): Payer: Medicare Other

## 2014-09-22 ENCOUNTER — Encounter (HOSPITAL_COMMUNITY)
Admission: RE | Disposition: A | Discharge status: Skilled Nursing Facility | Payer: Self-pay | Source: Ambulatory Visit | Attending: Orthopedic Surgery

## 2014-09-22 ENCOUNTER — Inpatient Hospital Stay (HOSPITAL_COMMUNITY)
Admission: RE | Admit: 2014-09-22 | Discharge: 2014-09-25 | DRG: 470 | Disposition: A | Discharge status: Skilled Nursing Facility | Payer: Medicare Other | Source: Ambulatory Visit | Attending: Orthopedic Surgery | Admitting: Orthopedic Surgery

## 2014-09-22 DIAGNOSIS — Z7902 Long term (current) use of antithrombotics/antiplatelets: Secondary | ICD-10-CM

## 2014-09-22 DIAGNOSIS — Z96659 Presence of unspecified artificial knee joint: Secondary | ICD-10-CM

## 2014-09-22 DIAGNOSIS — Z96652 Presence of left artificial knee joint: Secondary | ICD-10-CM

## 2014-09-22 DIAGNOSIS — Z8673 Personal history of transient ischemic attack (TIA), and cerebral infarction without residual deficits: Secondary | ICD-10-CM

## 2014-09-22 DIAGNOSIS — I10 Essential (primary) hypertension: Secondary | ICD-10-CM | POA: Diagnosis present

## 2014-09-22 DIAGNOSIS — I2510 Atherosclerotic heart disease of native coronary artery without angina pectoris: Secondary | ICD-10-CM | POA: Diagnosis present

## 2014-09-22 DIAGNOSIS — Z8249 Family history of ischemic heart disease and other diseases of the circulatory system: Secondary | ICD-10-CM

## 2014-09-22 DIAGNOSIS — Z86711 Personal history of pulmonary embolism: Secondary | ICD-10-CM

## 2014-09-22 DIAGNOSIS — Z886 Allergy status to analgesic agent status: Secondary | ICD-10-CM

## 2014-09-22 DIAGNOSIS — M1712 Unilateral primary osteoarthritis, left knee: Principal | ICD-10-CM

## 2014-09-22 DIAGNOSIS — Z880 Allergy status to penicillin: Secondary | ICD-10-CM

## 2014-09-22 DIAGNOSIS — E119 Type 2 diabetes mellitus without complications: Secondary | ICD-10-CM | POA: Diagnosis present

## 2014-09-22 DIAGNOSIS — Z888 Allergy status to other drugs, medicaments and biological substances status: Secondary | ICD-10-CM

## 2014-09-22 DIAGNOSIS — I252 Old myocardial infarction: Secondary | ICD-10-CM

## 2014-09-22 DIAGNOSIS — Z833 Family history of diabetes mellitus: Secondary | ICD-10-CM

## 2014-09-22 DIAGNOSIS — Z79899 Other long term (current) drug therapy: Secondary | ICD-10-CM

## 2014-09-22 DIAGNOSIS — E785 Hyperlipidemia, unspecified: Secondary | ICD-10-CM | POA: Diagnosis present

## 2014-09-22 DIAGNOSIS — Z96651 Presence of right artificial knee joint: Secondary | ICD-10-CM | POA: Diagnosis present

## 2014-09-22 HISTORY — DX: Unilateral primary osteoarthritis, left knee: M17.12

## 2014-09-22 HISTORY — PX: TOTAL KNEE ARTHROPLASTY: SHX125

## 2014-09-22 LAB — GLUCOSE, CAPILLARY
GLUCOSE-CAPILLARY: 157 mg/dL — AB (ref 65–99)
GLUCOSE-CAPILLARY: 107 mg/dL — AB (ref 65–99)
Glucose-Capillary: 134 mg/dL — ABNORMAL HIGH (ref 65–99)
Glucose-Capillary: 241 mg/dL — ABNORMAL HIGH (ref 65–99)

## 2014-09-22 SURGERY — ARTHROPLASTY, KNEE, TOTAL
Anesthesia: Monitor Anesthesia Care | Site: Knee | Laterality: Left

## 2014-09-22 MED ORDER — SENNA 8.6 MG PO TABS
1.0000 | ORAL_TABLET | Freq: Two times a day (BID) | ORAL | Status: DC
  Administered 2014-09-22 – 2014-09-25 (×6): 8.6 mg via ORAL
  Filled 2014-09-22 (×6): qty 1

## 2014-09-22 MED ORDER — RIVAROXABAN 10 MG PO TABS: 10.0000 mg | ORAL_TABLET | Freq: Every day | ORAL | Status: DC

## 2014-09-22 MED ORDER — INSULIN ASPART 100 UNIT/ML ~~LOC~~ SOLN
0.0000 [IU] | Freq: Three times a day (TID) | SUBCUTANEOUS | Status: DC
  Administered 2014-09-22: 2 [IU] via SUBCUTANEOUS
  Administered 2014-09-23: 3 [IU] via SUBCUTANEOUS
  Administered 2014-09-23: 2 [IU] via SUBCUTANEOUS
  Administered 2014-09-23: 13:00:00 via SUBCUTANEOUS
  Administered 2014-09-24 (×2): 3 [IU] via SUBCUTANEOUS
  Administered 2014-09-25: 2 [IU] via SUBCUTANEOUS
  Administered 2014-09-25: 3 [IU] via SUBCUTANEOUS

## 2014-09-22 MED ORDER — PROMETHAZINE HCL 25 MG/ML IJ SOLN: 6.2500 mg | INTRAMUSCULAR | Status: DC | PRN

## 2014-09-22 MED ORDER — LOSARTAN POTASSIUM 50 MG PO TABS
100.0000 mg | ORAL_TABLET | Freq: Every day | ORAL | Status: DC
  Administered 2014-09-22 – 2014-09-25 (×4): 100 mg via ORAL
  Filled 2014-09-22 (×4): qty 2

## 2014-09-22 MED ORDER — ALUM & MAG HYDROXIDE-SIMETH 200-200-20 MG/5ML PO SUSP: 30.0000 mL | ORAL | Status: DC | PRN

## 2014-09-22 MED ORDER — BUPIVACAINE HCL (PF) 0.25 % IJ SOLN
INTRAMUSCULAR | Status: DC | PRN
  Administered 2014-09-22: 20 mL

## 2014-09-22 MED ORDER — BISACODYL 10 MG RE SUPP: 10.0000 mg | Freq: Every day | RECTAL | Status: DC | PRN

## 2014-09-22 MED ORDER — METHOCARBAMOL 500 MG PO TABS
500.0000 mg | ORAL_TABLET | Freq: Four times a day (QID) | ORAL | Status: DC | PRN
  Administered 2014-09-22 – 2014-09-25 (×3): 500 mg via ORAL
  Filled 2014-09-22 (×3): qty 1

## 2014-09-22 MED ORDER — ONDANSETRON HCL 4 MG PO TABS: 4.0000 mg | ORAL_TABLET | Freq: Four times a day (QID) | ORAL | Status: DC | PRN

## 2014-09-22 MED ORDER — SENNA-DOCUSATE SODIUM 8.6-50 MG PO TABS: 2.0000 | ORAL_TABLET | Freq: Every day | ORAL | Status: DC

## 2014-09-22 MED ORDER — ROCURONIUM BROMIDE 50 MG/5ML IV SOLN
INTRAVENOUS | Status: AC
  Filled 2014-09-22: qty 1

## 2014-09-22 MED ORDER — OXYCODONE HCL 5 MG/5ML PO SOLN: 5.0000 mg | Freq: Once | ORAL | Status: DC | PRN

## 2014-09-22 MED ORDER — ACETAMINOPHEN 325 MG PO TABS
650.0000 mg | ORAL_TABLET | Freq: Four times a day (QID) | ORAL | Status: DC | PRN
  Administered 2014-09-22 – 2014-09-24 (×2): 650 mg via ORAL
  Filled 2014-09-22 (×2): qty 2

## 2014-09-22 MED ORDER — OXYCODONE-ACETAMINOPHEN 10-325 MG PO TABS: 1.0000 | ORAL_TABLET | Freq: Four times a day (QID) | ORAL | Status: DC | PRN

## 2014-09-22 MED ORDER — GLIMEPIRIDE 4 MG PO TABS
2.0000 mg | ORAL_TABLET | Freq: Every day | ORAL | Status: DC
  Administered 2014-09-23 – 2014-09-25 (×3): 2 mg via ORAL
  Filled 2014-09-22 (×3): qty 1

## 2014-09-22 MED ORDER — OXYCODONE HCL 5 MG PO TABS
5.0000 mg | ORAL_TABLET | ORAL | Status: DC | PRN
  Administered 2014-09-22: 10 mg via ORAL
  Administered 2014-09-22: 5 mg via ORAL
  Administered 2014-09-23 – 2014-09-25 (×5): 10 mg via ORAL
  Filled 2014-09-22 (×7): qty 2

## 2014-09-22 MED ORDER — ONDANSETRON HCL 4 MG PO TABS: 4.0000 mg | ORAL_TABLET | Freq: Three times a day (TID) | ORAL | Status: DC | PRN

## 2014-09-22 MED ORDER — DOCUSATE SODIUM 100 MG PO CAPS
100.0000 mg | ORAL_CAPSULE | Freq: Two times a day (BID) | ORAL | Status: DC
  Administered 2014-09-22 – 2014-09-25 (×8): 100 mg via ORAL
  Filled 2014-09-22 (×7): qty 1

## 2014-09-22 MED ORDER — POLYETHYLENE GLYCOL 3350 17 G PO PACK: 17.0000 g | PACK | Freq: Every day | ORAL | Status: DC | PRN

## 2014-09-22 MED ORDER — BUPIVACAINE IN DEXTROSE 0.75-8.25 % IT SOLN
INTRATHECAL | Status: DC | PRN
  Administered 2014-09-22: 2 mL via INTRATHECAL

## 2014-09-22 MED ORDER — SODIUM CHLORIDE 0.9 % IR SOLN
Status: DC | PRN
  Administered 2014-09-22: 1000 mL

## 2014-09-22 MED ORDER — PROPOFOL 10 MG/ML IV BOLUS
INTRAVENOUS | Status: AC
  Filled 2014-09-22: qty 20

## 2014-09-22 MED ORDER — MENTHOL 3 MG MT LOZG: 1.0000 | LOZENGE | OROMUCOSAL | Status: DC | PRN

## 2014-09-22 MED ORDER — DIPHENHYDRAMINE HCL 12.5 MG/5ML PO ELIX: 12.5000 mg | ORAL_SOLUTION | ORAL | Status: DC | PRN

## 2014-09-22 MED ORDER — METOCLOPRAMIDE HCL 5 MG/ML IJ SOLN: 5.0000 mg | Freq: Three times a day (TID) | INTRAMUSCULAR | Status: DC | PRN

## 2014-09-22 MED ORDER — METOCLOPRAMIDE HCL 5 MG PO TABS: 5.0000 mg | ORAL_TABLET | Freq: Three times a day (TID) | ORAL | Status: DC | PRN

## 2014-09-22 MED ORDER — ONDANSETRON HCL 4 MG/2ML IJ SOLN
INTRAMUSCULAR | Status: AC
  Filled 2014-09-22: qty 2

## 2014-09-22 MED ORDER — AMLODIPINE BESYLATE 5 MG PO TABS
5.0000 mg | ORAL_TABLET | Freq: Every day | ORAL | Status: DC
  Administered 2014-09-22 – 2014-09-25 (×4): 5 mg via ORAL
  Filled 2014-09-22 (×4): qty 1

## 2014-09-22 MED ORDER — PHENOL 1.4 % MT LIQD: 1.0000 | OROMUCOSAL | Status: DC | PRN

## 2014-09-22 MED ORDER — DEXTROSE 5 % IV SOLN
500.0000 mg | Freq: Four times a day (QID) | INTRAVENOUS | Status: DC | PRN
  Filled 2014-09-22: qty 5

## 2014-09-22 MED ORDER — ONDANSETRON HCL 4 MG/2ML IJ SOLN: 4.0000 mg | Freq: Four times a day (QID) | INTRAMUSCULAR | Status: DC | PRN

## 2014-09-22 MED ORDER — RIVAROXABAN 10 MG PO TABS
10.0000 mg | ORAL_TABLET | Freq: Every day | ORAL | Status: DC
  Administered 2014-09-23 – 2014-09-25 (×3): 10 mg via ORAL
  Filled 2014-09-22 (×3): qty 1

## 2014-09-22 MED ORDER — HYDROMORPHONE HCL 1 MG/ML IJ SOLN
1.0000 mg | INTRAMUSCULAR | Status: DC | PRN
  Administered 2014-09-22: 1 mg via INTRAVENOUS
  Filled 2014-09-22: qty 1

## 2014-09-22 MED ORDER — POTASSIUM CHLORIDE IN NACL 20-0.45 MEQ/L-% IV SOLN
INTRAVENOUS | Status: DC
  Administered 2014-09-22 – 2014-09-23 (×2): via INTRAVENOUS
  Filled 2014-09-22 (×8): qty 1000

## 2014-09-22 MED ORDER — OXYCODONE HCL 5 MG PO TABS: 5.0000 mg | ORAL_TABLET | Freq: Once | ORAL | Status: DC | PRN

## 2014-09-22 MED ORDER — LIDOCAINE HCL (CARDIAC) 20 MG/ML IV SOLN
INTRAVENOUS | Status: AC
  Filled 2014-09-22: qty 5

## 2014-09-22 MED ORDER — FENTANYL CITRATE (PF) 100 MCG/2ML IJ SOLN
INTRAMUSCULAR | Status: DC | PRN
Start: 2014-09-22 — End: 2014-09-22
  Administered 2014-09-22: 50 ug via INTRAVENOUS
  Administered 2014-09-22 (×2): 25 ug via INTRAVENOUS

## 2014-09-22 MED ORDER — BUPIVACAINE LIPOSOME 1.3 % IJ SUSP
20.0000 mL | INTRAMUSCULAR | Status: AC
  Administered 2014-09-22: 20 mL
  Filled 2014-09-22: qty 20

## 2014-09-22 MED ORDER — CEFAZOLIN SODIUM-DEXTROSE 2-3 GM-% IV SOLR
2.0000 g | Freq: Four times a day (QID) | INTRAVENOUS | Status: AC
  Administered 2014-09-22 (×2): 2 g via INTRAVENOUS
  Filled 2014-09-22 (×2): qty 50

## 2014-09-22 MED ORDER — MIDAZOLAM HCL 5 MG/5ML IJ SOLN
INTRAMUSCULAR | Status: DC | PRN
  Administered 2014-09-22: 2 mg via INTRAVENOUS

## 2014-09-22 MED ORDER — ONDANSETRON HCL 4 MG/2ML IJ SOLN
INTRAMUSCULAR | Status: DC | PRN
  Administered 2014-09-22: 4 mg via INTRAVENOUS

## 2014-09-22 MED ORDER — ACETAMINOPHEN 650 MG RE SUPP: 650.0000 mg | Freq: Four times a day (QID) | RECTAL | Status: DC | PRN

## 2014-09-22 MED ORDER — 0.9 % SODIUM CHLORIDE (POUR BTL) OPTIME
TOPICAL | Status: DC | PRN
  Administered 2014-09-22: 1000 mL

## 2014-09-22 MED ORDER — FENTANYL CITRATE (PF) 250 MCG/5ML IJ SOLN
INTRAMUSCULAR | Status: AC
  Filled 2014-09-22: qty 5

## 2014-09-22 MED ORDER — SIMVASTATIN 20 MG PO TABS
20.0000 mg | ORAL_TABLET | Freq: Every day | ORAL | Status: DC
  Administered 2014-09-22 – 2014-09-24 (×3): 20 mg via ORAL
  Filled 2014-09-22 (×3): qty 1

## 2014-09-22 MED ORDER — HYDROMORPHONE HCL 1 MG/ML IJ SOLN: 0.2500 mg | INTRAMUSCULAR | Status: DC | PRN

## 2014-09-22 MED ORDER — LACTATED RINGERS IV SOLN
INTRAVENOUS | Status: DC | PRN
  Administered 2014-09-22 (×2): via INTRAVENOUS

## 2014-09-22 MED ORDER — MIDAZOLAM HCL 2 MG/2ML IJ SOLN
INTRAMUSCULAR | Status: AC
  Filled 2014-09-22: qty 2

## 2014-09-22 MED ORDER — PROPOFOL INFUSION 10 MG/ML OPTIME
INTRAVENOUS | Status: DC | PRN
  Administered 2014-09-22: 50 ug/kg/min via INTRAVENOUS

## 2014-09-22 MED ORDER — PHENYLEPHRINE HCL 10 MG/ML IJ SOLN
INTRAMUSCULAR | Status: DC | PRN
  Administered 2014-09-22 (×4): 40 ug via INTRAVENOUS

## 2014-09-22 MED ORDER — BACLOFEN 10 MG PO TABS: 10.0000 mg | ORAL_TABLET | Freq: Three times a day (TID) | ORAL | Status: DC

## 2014-09-22 MED ORDER — NITROGLYCERIN 0.4 MG SL SUBL: 0.4000 mg | SUBLINGUAL_TABLET | SUBLINGUAL | Status: DC | PRN

## 2014-09-22 MED ORDER — MAGNESIUM CITRATE PO SOLN: 1.0000 | Freq: Once | ORAL | Status: AC | PRN

## 2014-09-22 SURGICAL SUPPLY — 66 items
APL SKNCLS STERI-STRIP NONHPOA (GAUZE/BANDAGES/DRESSINGS) ×1
BANDAGE ELASTIC 6 VELCRO ST LF (GAUZE/BANDAGES/DRESSINGS) ×4 IMPLANT
BANDAGE ESMARK 6X9 LF (GAUZE/BANDAGES/DRESSINGS) ×1 IMPLANT
BENZOIN TINCTURE PRP APPL 2/3 (GAUZE/BANDAGES/DRESSINGS) ×3 IMPLANT
BLADE SAG 18X100X1.27 (BLADE) ×3 IMPLANT
BLADE SAW RECIP 87.9 MT (BLADE) ×3 IMPLANT
BLADE SAW SGTL 13X75X1.27 (BLADE) ×3 IMPLANT
BNDG CMPR 9X6 STRL LF SNTH (GAUZE/BANDAGES/DRESSINGS) ×1
BNDG ESMARK 6X9 LF (GAUZE/BANDAGES/DRESSINGS) ×3
BOOTCOVER CLEANROOM LRG (PROTECTIVE WEAR) ×6 IMPLANT
BOWL SMART MIX CTS (DISPOSABLE) ×3 IMPLANT
CAP KNEE TOTAL 3 SIGMA ×2 IMPLANT
CEMENT HV SMART SET (Cement) ×6 IMPLANT
CLOSURE STERI-STRIP 1/2X4 (GAUZE/BANDAGES/DRESSINGS) ×1
CLSR STERI-STRIP ANTIMIC 1/2X4 (GAUZE/BANDAGES/DRESSINGS) ×2 IMPLANT
COVER SURGICAL LIGHT HANDLE (MISCELLANEOUS) ×3 IMPLANT
CUFF TOURNIQUET SINGLE 34IN LL (TOURNIQUET CUFF) ×3 IMPLANT
DRAPE EXTREMITY T 121X128X90 (DRAPE) ×3 IMPLANT
DRAPE IMP U-DRAPE 54X76 (DRAPES) ×3 IMPLANT
DRAPE U-SHAPE 47X51 STRL (DRAPES) ×3 IMPLANT
DRSG PAD ABDOMINAL 8X10 ST (GAUZE/BANDAGES/DRESSINGS) ×2 IMPLANT
DURAPREP 26ML APPLICATOR (WOUND CARE) ×3 IMPLANT
ELECT CAUTERY BLADE 6.4 (BLADE) ×3 IMPLANT
ELECT REM PT RETURN 9FT ADLT (ELECTROSURGICAL) ×3
ELECTRODE REM PT RTRN 9FT ADLT (ELECTROSURGICAL) ×1 IMPLANT
GAUZE SPONGE 4X4 12PLY STRL (GAUZE/BANDAGES/DRESSINGS) ×3 IMPLANT
GLOVE BIOGEL PI IND STRL 8 (GLOVE) ×1 IMPLANT
GLOVE BIOGEL PI INDICATOR 8 (GLOVE) ×2
GLOVE BIOGEL PI ORTHO PRO SZ8 (GLOVE) ×2
GLOVE ORTHO TXT STRL SZ7.5 (GLOVE) ×3 IMPLANT
GLOVE PI ORTHO PRO STRL SZ8 (GLOVE) ×1 IMPLANT
GLOVE SURG ORTHO 8.0 STRL STRW (GLOVE) ×3 IMPLANT
GOWN STRL REUS W/ TWL XL LVL3 (GOWN DISPOSABLE) ×1 IMPLANT
GOWN STRL REUS W/TWL 2XL LVL3 (GOWN DISPOSABLE) ×3 IMPLANT
GOWN STRL REUS W/TWL XL LVL3 (GOWN DISPOSABLE) ×9
HANDPIECE INTERPULSE COAX TIP (DISPOSABLE) ×3
HOOD PEEL AWAY FACE SHEILD DIS (HOOD) ×6 IMPLANT
IMMOBILIZER KNEE 22 (SOFTGOODS) ×3 IMPLANT
KIT BASIN OR (CUSTOM PROCEDURE TRAY) ×3 IMPLANT
KIT ROOM TURNOVER OR (KITS) ×3 IMPLANT
MANIFOLD NEPTUNE II (INSTRUMENTS) ×3 IMPLANT
NDL 18GX1X1/2 (RX/OR ONLY) (NEEDLE) ×1 IMPLANT
NEEDLE 18GX1X1/2 (RX/OR ONLY) (NEEDLE) ×3 IMPLANT
NS IRRIG 1000ML POUR BTL (IV SOLUTION) ×3 IMPLANT
PACK TOTAL JOINT (CUSTOM PROCEDURE TRAY) ×3 IMPLANT
PACK UNIVERSAL I (CUSTOM PROCEDURE TRAY) ×3 IMPLANT
PAD ABD 8X10 STRL (GAUZE/BANDAGES/DRESSINGS) ×3 IMPLANT
PAD ARMBOARD 7.5X6 YLW CONV (MISCELLANEOUS) ×6 IMPLANT
PAD CAST 4YDX4 CTTN HI CHSV (CAST SUPPLIES) ×1 IMPLANT
PADDING CAST COTTON 4X4 STRL (CAST SUPPLIES) ×3
PADDING CAST COTTON 6X4 STRL (CAST SUPPLIES) ×3 IMPLANT
SET HNDPC FAN SPRY TIP SCT (DISPOSABLE) ×1 IMPLANT
SPONGE GAUZE 4X4 12PLY STER LF (GAUZE/BANDAGES/DRESSINGS) ×2 IMPLANT
SUCTION FRAZIER TIP 10 FR DISP (SUCTIONS) ×3 IMPLANT
SUT MNCRL AB 4-0 PS2 18 (SUTURE) IMPLANT
SUT VIC AB 0 CT1 27 (SUTURE) ×6
SUT VIC AB 0 CT1 27XBRD ANBCTR (SUTURE) ×1 IMPLANT
SUT VIC AB 2-0 CT1 27 (SUTURE) ×3
SUT VIC AB 2-0 CT1 TAPERPNT 27 (SUTURE) ×1 IMPLANT
SUT VIC AB 3-0 SH 8-18 (SUTURE) ×6 IMPLANT
SYR 30ML LL (SYRINGE) IMPLANT
SYR 50ML LL SCALE MARK (SYRINGE) ×3 IMPLANT
TOWEL OR 17X24 6PK STRL BLUE (TOWEL DISPOSABLE) ×3 IMPLANT
TOWEL OR 17X26 10 PK STRL BLUE (TOWEL DISPOSABLE) ×3 IMPLANT
TRAY CATH 16FR W/PLASTIC CATH (SET/KITS/TRAYS/PACK) ×2 IMPLANT
WATER STERILE IRR 1000ML POUR (IV SOLUTION) ×6 IMPLANT

## 2014-09-22 NOTE — Op Note (Signed)
DATE OF SURGERY:  09/22/2014 TIME: 9:50 AM  PATIENT NAME:  Kristen [name]   AGE: 74 y.o.    PRE-OPERATIVE DIAGNOSIS:  left knee primary localized osteoarthritis  POST-OPERATIVE DIAGNOSIS:  Same  PROCEDURE:  Procedure(s):  LEFT TOTAL KNEE ARTHROPLASTY  SURGEON:  Johnny Bridge, MD   ASSISTANT:  Joya Gaskins, OPA-C, present and scrubbed throughout the case, critical for assistance with exposure, retraction, instrumentation, and closure.   OPERATIVE IMPLANTS: Depuy PFC Sigma, Posterior Stabilized.  Femur size 3, Tibia size 4, Patella size 38 3-peg oval button, with a 10 mm polyethylene insert.   PREOPERATIVE INDICATIONS:  Kristen [name] is a 74 y.o. year old female with end stage bone on bone degenerative arthritis of the knee who failed conservative treatment, including injections, antiinflammatories, activity modification, and assistive devices, and had significant impairment of their activities of daily living, and elected for Total Knee Arthroplasty.   The risks, benefits, and alternatives were discussed at length including but not limited to the risks of infection, bleeding, nerve injury, stiffness, blood clots, the need for revision surgery, cardiopulmonary complications, among others, and they were willing to proceed.  OPERATIVE FINDINGS AND UNIQUE ASPECTS OF THE CASE:  There was extensive medial compartment grade 4 chondral loss with some bony erosions and osteophyte formation was fairly severe. The patellofemoral joint also had advanced changes underneath the patella. The lateral compartment still had some preserved cartilage. The anterior cruciate ligament and PCL were intact. There was complex tearing of the meniscus particularly on the medial side.  I cut the distal femur at 10 mm, 5 left, however the extension gap after the tibial resection was still only 8 mm, and so I took another 2 mm off of the femur. The initial femoral cut was fairly minimal.  After resection of the  posterior femoral condyle, the gap was actually still a little bit tight, would not accommodate a 10 mm spacer, and I removed osteophytes from posteriorly, released the coronary ligaments along the proximal medial tibia, and completely released the PCL, which then provided satisfactory balance of flexion and extension, although the flexion was slightly tighter than extension. I did not feel that taking more tibia would've been beneficial, as we would have ended up in hyperextension and loose in extension.  OPERATIVE DESCRIPTION:  The patient was brought to the operative room and placed in a supine position.  Spinal anesthesia with exparel after implants were in was administered.  IV antibiotics were given in the form of Ancef with no adverse complications despite a penicillin allergy..  The lower extremity was prepped and draped in the usual sterile fashion.  Time out was performed.  The leg was elevated and exsanguinated and the tourniquet was inflated.  Anterior quadriceps tendon splitting approach was performed.  The patella was everted and osteophytes were removed.  The anterior horn of the medial and lateral meniscus was removed.   The distal femur was opened with the drill and the intramedullary distal femoral cutting jig was utilized, set at 5 degrees resecting 10 mm off the distal femur.  Care was taken to protect the collateral ligaments.  Then the extramedullary tibial cutting jig was utilized making the appropriate cut using the anterior tibial crest as a reference building in appropriate posterior slope.  Care was taken during the cut to protect the medial and collateral ligaments.  The proximal tibia was removed along with the posterior horns of the menisci.  The PCL was sacrificed.    The extensor gap  was measured and was approximately 8 mm, and I removed both medial and lateral meniscus. I went back and took an additional 2 mm off of the femur.  The distal femoral sizing jig was applied,  taking care to avoid notching.  Then the 4-in-1 cutting jig was applied and the anterior and posterior femur was cut, along with the chamfer cuts.  All posterior osteophytes were removed.  The flexion gap was then measured and was 8 mm at this point, and I removed posterior osteophytes, released the coronary ligaments, and then my flexion gap was approximately 10 mm, and was symmetric with the extension gap.  I completed the distal femoral preparation using the appropriate jig to prepare the box.  The patella was then measured, and cut with the saw.  The thickness before the cut was 23.5 and after the cut was 14.  The proximal tibia sized and prepared accordingly with the reamer and the punch, and then all components were trialed with the 73mm poly insert.  The poly was still somewhat difficult to get in because of it was slightly tight in flexion, but I was able to place the trial. The knee was found to have excellent balance and full motion.    The above named components were then cemented into place and all excess cement was removed.  The real polyethylene implant was placed.  After the cement had cured I released the tourniquet and confirmed excellent hemostasis with no major posterior vessel injury.    The knee was easily taken through a range of motion and the patella tracked well and the knee irrigated copiously and the parapatellar and subcutaneous tissue closed with vicryl, and monocryl with steri strips for the skin.  The wounds were injected with marcaine, and dressed with sterile gauze and the patient was awakened and returned to the PACU in stable and satisfactory condition.  There were no complications.  Total tourniquet time was 100 minutes.

## 2014-09-22 NOTE — Anesthesia Preprocedure Evaluation (Addendum)
Anesthesia Evaluation  Patient identified by MRN, date of birth, ID band Patient awake    Reviewed: Allergy & Precautions, NPO status , Patient's Chart, lab work & pertinent test results  Airway Mallampati: II  TM Distance: >3 FB Neck ROM: Full    Dental   Pulmonary sleep apnea ,  breath sounds clear to auscultation        Cardiovascular hypertension, Pt. on medications + CAD, + Past MI and + Peripheral Vascular Disease Rhythm:Regular Rate:Normal  Echo 07/03/2013: 1. LV EF, by visual estimation, is 55-60% 2. Normal global LV systolic function 3. Impaired relaxation pattern of LV diastolic filling.  4. Mild LV hypertrophy.  5. Normal RV size and systolic function 6. Mild mitral valve regurgitation 7. Normal RVSP. Echo 07/03/2013: 1. LV EF, by visual estimation, is 55-60%  Low risk nuclear stress test 06/2013 2. Normal global LV systolic function 3. Impaired relaxation pattern of LV diastolic filling.  4. Mild LV hypertrophy.  5. Normal RV size and systolic function 6. Mild mitral valve regurgitation 7. Normal RVSP.    Neuro/Psych CVA    GI/Hepatic negative GI ROS, Neg liver ROS,   Endo/Other  diabetes, Type 2, Oral Hypoglycemic AgentsMorbid obesity  Renal/GU Renal InsufficiencyRenal disease     Musculoskeletal  (+) Arthritis -,   Abdominal   Peds  Hematology  (+) anemia ,   Anesthesia Other Findings   Reproductive/Obstetrics                            Anesthesia Physical Anesthesia Plan  ASA: III  Anesthesia Plan: Spinal and MAC   Post-op Pain Management:    Induction: Intravenous  Airway Management Planned: Simple Face Mask and Natural Airway  Additional Equipment:   Intra-op Plan:   Post-operative Plan:   Informed Consent: I have reviewed the patients History and Physical, chart, labs and discussed the procedure including the risks, benefits and alternatives for the  proposed anesthesia with the patient or authorized representative who has indicated his/her understanding and acceptance.   Dental advisory given  Plan Discussed with: CRNA  Anesthesia Plan Comments:        Anesthesia Quick Evaluation

## 2014-09-22 NOTE — Transfer of Care (Signed)
Immediate Anesthesia Transfer of Care Note  Patient: Kristen [name]  Procedure(s) Performed: Procedure(s): LEFT TOTAL KNEE ARTHROPLASTY (Left)  Patient Location: PACU  Anesthesia Type:MAC and Spinal  Level of Consciousness: awake, alert  and oriented  Airway & Oxygen Therapy: Patient Spontanous Breathing  Post-op Assessment: Report given to RN and Post -op Vital signs reviewed and stable  Post vital signs: Reviewed and stable  Last Vitals:  Filed Vitals:   09/22/14 1030  BP:   Pulse:   Temp: 36.4 C  Resp:     Complications: No apparent anesthesia complications

## 2014-09-22 NOTE — Progress Notes (Signed)
09/22/14   1940   Patient is still sleeping as CM approached room.  PT evaluation is still in process due to patient is sleepy post surgery.

## 2014-09-22 NOTE — Anesthesia Procedure Notes (Signed)
Spinal Patient location during procedure: OR Staffing Anesthesiologist: Farra Nikolic Performed by: anesthesiologist  Preanesthetic Checklist Completed: patient identified, site marked, surgical consent, pre-op evaluation, timeout performed, IV checked, risks and benefits discussed and monitors and equipment checked Spinal Block Patient position: sitting Prep: DuraPrep Patient monitoring: heart rate, continuous pulse ox and blood pressure Approach: right paramedian Location: L4-5 Injection technique: single-shot Needle Needle type: Quincke  Needle gauge: 22 G Needle length: 9 cm Additional Notes Expiration date of kit checked and confirmed. Patient tolerated procedure well, without complications.     

## 2014-09-22 NOTE — Care Management Note (Signed)
Case Management Note  Patient Details  Name: Kristen [name] MRN: [id number] Date of Birth: [birth year]  Subjective/Objective:     Patient is from home.  S/P  LEFT TOTAL KNEE ARTHROPLASTY          Action/Plan:  Awaits PT evaluations  Expected Discharge Date:      09/24/14            Expected Discharge Plan:     In-House Referral:     Discharge planning Services     Post Acute Care Choice:    Choice offered to:     DME Arranged:    DME Agency:     HH Arranged:    [place] Agency:     Status of Service:     Medicare Important Message Given:    Date Medicare IM Given:    Medicare IM give by:    Date Additional Medicare IM Given:    Additional Medicare Important Message give by:     If discussed at [place] of Stay Meetings, dates discussed:    Additional Comments:  Dimas Aguas, RN 09/22/2014, 7:43 PM

## 2014-09-22 NOTE — H&P (Signed)
PREOPERATIVE H&P  Chief Complaint: djd left knee  HPI: Kristen [name] is a 74 y.o. female who presents for preoperative history and physical with a diagnosis of djd left knee. Symptoms are rated as moderate to severe, and have been worsening.  This is significantly impairing activities of daily living.  She has elected for surgical management.   She has failed injections, activity modification, anti-inflammatories, and assistive devices.  Preoperative X-rays demonstrate end stage degenerative changes with osteophyte formation, loss of joint space, subchondral sclerosis.  Past Medical History  Diagnosis Date  . HTN (hypertension)   . Diabetes type 2, controlled   . HLD (hyperlipidemia)   . Morbid obesity   . Vertigo     hx. benign postitional postural  . Internal capsule hemorrhage     hx of sublacunar infarct involving the right posterior limb of the internal capsule   . Osteoarthritis     knees  . Frequent headaches   . History of pulmonary embolism 2012  . CAD (coronary artery disease)     by CT, per pt h/o MI  . Myocardial infarction 2012    per pt. report, states she was treated with medicine  , here at [facility]    . Stroke     still has balance problem on occas. , uses cane but that's mainly for the left knee pain  . Sleep apnea 2011    study done in [place], states that since she lost weight she doesn't use the CPAP any longer & she doesn't have a problem with sleep apnea   . Shortness of breath dyspnea     yes-relative to pain    Past Surgical History  Procedure Laterality Date  . Total knee arthroplasty Right 1990s  . Partial hysterectomy      for fibroids, ovaries remain  . Foot surgery Right   . Cataract extraction Bilateral 2013  . Eye surgery      /w IOL  . Tonsillectomy    . Tubal ligation     History   Social History  . Marital Status: Widowed    Spouse Name: N/A  . Number of Children: N/A  . Years of Education: N/A   Social History Main Topics   . Smoking status: Never Smoker   . Smokeless tobacco: Never Used     Comment: tobacco use- no   . Alcohol Use: Yes     Comment: Rare  . Drug Use: No  . Sexual Activity: Not on file   Other Topics Concern  . None   Social History Narrative   Lives alone   4 grown children   Education: 11th grade   Occupation: retired, was Scientist, water quality   Activity: Does not regularly exercise.    Diet: good water, fruits/vegetables seldom      Advanced directive: does not have set up. Would like info on this. Doesn't have HCPOA in mind. Doesn't want prolonged life support.   Family History  Problem Relation Age of Onset  . Cancer Mother     bone  . Diabetes Father   . Cancer Son 31    lung  . Congenital heart disease Son 40  . Hypertension Father    Allergies  Allergen Reactions  . Aleve [Naproxen Sodium] Other (See Comments)    Spits up blood  . Metformin And Related Other (See Comments)    Chills, dizziness  . Penicillins Rash    Whelps and swelling   Prior to Admission medications  Medication Sig Start Date End Date Taking? Authorizing Provider  acetaminophen (TYLENOL) 500 MG tablet Take 500 mg by mouth every 6 (six) hours as needed.   Yes Historical Provider, MD  amLODipine (NORVASC) 5 MG tablet TAKE 1 TABLET (5 MG TOTAL) BY MOUTH DAILY. Patient taking differently: TAKE 1 TABLET (5 MG TOTAL) BY MOUTH DAILY., with breakfast 09/04/14  Yes [name], MD  aspirin (ASPIR-81) 81 MG EC tablet Take 81 mg by mouth daily.     Yes Historical Provider, MD  Blood Glucose Monitoring Suppl (ONE TOUCH ULTRA SYSTEM KIT) W/DEVICE KIT 1 kit by Does not apply route once. 07/17/13  Yes [name], MD  glimepiride (AMARYL) 2 MG tablet Take 1 tablet (2 mg total) by mouth daily with breakfast. 12/11/13  Yes [name], MD  glucose blood test strip 250.02 Use as instructed 07/17/13  Yes [name], MD  HYDROcodone-acetaminophen (NORCO/VICODIN) 5-325 MG per tablet Take 1 tablet  by mouth every 8 (eight) hours as needed for moderate pain. 06/22/14  Yes [name], MD  losartan (COZAAR) 100 MG tablet Take 1 tablet (100 mg total) by mouth daily. 09/11/13  Yes [name], MD  Multiple Vitamins-Iron (MULTIVITAMIN/IRON PO) Take 1 tablet by mouth daily.   Yes Historical Provider, MD  nitroGLYCERIN (NITROSTAT) 0.4 MG SL tablet Place 0.4 mg under the tongue every 5 (five) minutes as needed for chest pain.   Yes Historical Provider, MD  simvastatin (ZOCOR) 20 MG tablet TAKE 1 TABLET (20 MG TOTAL) BY MOUTH AT BEDTIME. Patient taking differently: TAKE 1 TABLET (20 MG TOTAL) BY MOUTH with breakfast 09/04/14  Yes [name], MD     Positive ROS: All other systems have been reviewed and were otherwise negative with the exception of those mentioned in the HPI and as above.  Physical Exam: General: Alert, no acute distress Cardiovascular: No pedal edema Respiratory: No cyanosis, no use of accessory musculature GI: No organomegaly, abdomen is soft and non-tender Skin: No lesions in the area of chief complaint Neurologic: Sensation intact distally Psychiatric: Patient is competent for consent with normal mood and affect Lymphatic: No axillary or cervical lymphadenopathy  MUSCULOSKELETAL: ROM 5-120, positive crepitance, pain with rom  Assessment: djd left knee  Plan: Plan for Procedure(s): LEFT TOTAL KNEE ARTHROPLASTY  The risks benefits and alternatives were discussed with the patient including but not limited to the risks of nonoperative treatment, versus surgical intervention including infection, bleeding, nerve injury,  blood clots, cardiopulmonary complications, morbidity, mortality, among others, and they were willing to proceed.   Johnny Bridge, MD Cell [phone]   09/22/2014 7:23 AM

## 2014-09-22 NOTE — Progress Notes (Signed)
Patient complaining of left ear pain and pressure. Dr Ola Spurr at bedside to assess. Most likely positional, will continue to monitor. No orders at this time.

## 2014-09-22 NOTE — Anesthesia Postprocedure Evaluation (Signed)
  Anesthesia Post-op Note  Patient: Kristen [name]  Procedure(s) Performed: Procedure(s): LEFT TOTAL KNEE ARTHROPLASTY (Left)  Patient Location: PACU  Anesthesia Type:Spinal  Level of Consciousness: awake and alert   Airway and Oxygen Therapy: Patient Spontanous Breathing  Post-op Pain: mild  Post-op Assessment: Post-op Vital signs reviewed  Post-op Vital Signs: Reviewed  Last Vitals:  Filed Vitals:   09/22/14 1400  BP: 149/67  Pulse: 57  Temp: 36.6 C  Resp: 16    Complications: No apparent anesthesia complications

## 2014-09-22 NOTE — Progress Notes (Signed)
PT Cancellation Note  Patient Details Name: Kristen [name] MRN: [id number] DOB: [birth year]   Cancelled Treatment:    Reason Eval/Treat Not Completed: Patient's level of consciousness Pt not able to keep eyes opened enough to participate in therapy. Just given pain meds and very drowsy requesting to nap. Will follow up on 6/1 as appropriate.   [place] 09/22/2014, 3:30 PM [name], [place], DPT [phone] \

## 2014-09-23 ENCOUNTER — Encounter (HOSPITAL_COMMUNITY): Payer: Self-pay | Admitting: Orthopedic Surgery

## 2014-09-23 LAB — GLUCOSE, CAPILLARY
GLUCOSE-CAPILLARY: 177 mg/dL — AB (ref 65–99)
GLUCOSE-CAPILLARY: 221 mg/dL — AB (ref 65–99)
Glucose-Capillary: 185 mg/dL — ABNORMAL HIGH (ref 65–99)
Glucose-Capillary: 145 mg/dL — ABNORMAL HIGH (ref 65–99)
Glucose-Capillary: 126 mg/dL — ABNORMAL HIGH (ref 65–99)

## 2014-09-23 LAB — BASIC METABOLIC PANEL
Anion gap: 13 (ref 5–15)
BUN: 11 mg/dL (ref 6–20)
CALCIUM: 8.6 mg/dL — AB (ref 8.9–10.3)
CHLORIDE: 96 mmol/L — AB (ref 101–111)
CO2: 26 mmol/L (ref 22–32)
CREATININE: 1 mg/dL (ref 0.44–1.00)
GFR calc Af Amer: >60 mL/min (ref >60)
GFR calc non Af Amer: 55 mL/min — ABNORMAL LOW (ref >60)
Glucose, Bld: 219 mg/dL — ABNORMAL HIGH (ref 65–99)
POTASSIUM: 3.7 mmol/L (ref 3.5–5.1)
SODIUM: 135 mmol/L (ref 135–145)

## 2014-09-23 LAB — CBC
HEMATOCRIT: 35.8 % — AB (ref 36.0–46.0)
HEMOGLOBIN: 11.2 g/dL — AB (ref 12.0–15.0)
MCH: 26.5 pg (ref 26.0–34.0)
MCHC: 31.3 g/dL (ref 30.0–36.0)
MCV: 84.8 fL (ref 78.0–100.0)
Platelets: 207 10*3/uL (ref 150–400)
RBC: 4.22 MIL/uL (ref 3.87–5.11)
RDW: 15.1 % (ref 11.5–15.5)
WBC: 8.9 10*3/uL (ref 4.0–10.5)

## 2014-09-23 NOTE — Evaluation (Signed)
Physical Therapy Evaluation Patient Details Name: Kristen [name] MRN: [id number] DOB: [birth year] Today's Date: 09/23/2014   History of Present Illness  Patient is a 74 y/o female s/p L TKA. PMH includes HTN, HLD, DM, vertigo, PE, CAD, MI, CVA.  Clinical Impression  Patient presents with pain, nausea and post surgical deficits LLE s/p above surgery impacting mobility. Increased time to perform all mobility secondary to pain/weakness. Concerned about pt going home alone but pt refusing [facility] SNF. Reports family can check up on her daily? Education provided on there ex. Encouraged OOB as much as tolerated. Would benefit from skilled PT to maximize independence and mobility prior to return home.    Follow Up Recommendations Home health PT;Supervision/Assistance - 24 hour    Equipment Recommendations  Rolling walker with 5" wheels;3in1 (PT)    Recommendations for Other Services OT consult     Precautions / Restrictions Precautions Precautions: Knee;Fall Precaution Booklet Issued: Yes (comment) Precaution Comments: Reviewed no pillow under knee and Handout. Restrictions Weight Bearing Restrictions: Yes LLE Weight Bearing: Weight bearing as tolerated      Mobility  Bed Mobility Overal bed mobility: Needs Assistance Bed Mobility: Supine to Sit     Supine to sit: Min assist;HOB elevated     General bed mobility comments: Min A to bring LLE to EOB; instructed pt to use sheet to assist lifting LLE to EOB. Increased time/pain. Use of rails.   Transfers Overall transfer level: Needs assistance Equipment used: Rolling walker (2 wheeled) Transfers: Sit to/from Stand Sit to Stand: Min assist         General transfer comment: Min A to boost from EOB x1, from toilet x1. + nausea upon standing. Transferred to chair post ambulation bout.  Ambulation/Gait Ambulation/Gait assistance: Min assist Ambulation Distance (Feet): 15 Feet (x2 bouts) Assistive device: Rolling walker (2  wheeled) Gait Pattern/deviations: Step-to pattern;Decreased stance time - left;Decreased step length - right;Trunk flexed   Gait velocity interpretation: Below normal speed for age/gender General Gait Details: Slow, unsteady gait. 1 instance of knee instability and partial buckling LLE towards end of gait.  Stairs            Wheelchair Mobility    Modified Rankin (Stroke Patients Only)       Balance Overall balance assessment: Needs assistance Sitting-balance support: Feet supported;No upper extremity supported Sitting balance-Leahy Scale: Fair     Standing balance support: During functional activity Standing balance-Leahy Scale: Poor Standing balance comment: Relient on RW for support.                              Pertinent Vitals/Pain Pain Assessment: 0-10 Pain Score: 6  Pain Location: left knee Pain Descriptors / Indicators: Sore;Aching Pain Intervention(s): Limited activity within patient's tolerance;Monitored during session;Premedicated before session;Repositioned;Ice applied    Home Living Family/patient expects to be discharged to:: Private residence Living Arrangements: Alone Available Help at Discharge: Family;Available PRN/intermittently Type of Home: House Home Access: Stairs to enter Entrance Stairs-Rails: Right Entrance Stairs-Number of Steps: 2 Home Layout: One level Home Equipment: Walker - 2 wheels;Bedside commode (Reports both are broken.)      Prior Function Level of Independence: Independent with assistive device(s)         Comments: Pt using RW PTA.     Hand Dominance        Extremity/Trunk Assessment   Upper Extremity Assessment: Defer to OT evaluation  Lower Extremity Assessment: LLE deficits/detail   LLE Deficits / Details: Limited AROM hip/knee secondary to pain/surgery.     Communication   Communication: No difficulties  Cognition Arousal/Alertness: Awake/alert Behavior During Therapy: WFL  for tasks assessed/performed Overall Cognitive Status: Within Functional Limits for tasks assessed (Concern about safety?)                      General Comments      Exercises Total Joint Exercises Ankle Circles/Pumps: Both;15 reps;Supine Quad Sets: Left;5 reps;Supine Hip ABduction/ADduction: AAROM;Left;5 reps;Supine Goniometric ROM: 10-60 degrees knee AROM      Assessment/Plan    PT Assessment Patient needs continued PT services  PT Diagnosis Acute pain;Generalized weakness;Difficulty walking   PT Problem List Decreased strength;Pain;Decreased range of motion;Decreased activity tolerance;Decreased balance;Decreased mobility;Decreased safety awareness  PT Treatment Interventions Balance training;Gait training;Stair training;Functional mobility training;Therapeutic activities;Therapeutic exercise;Patient/family education   PT Goals (Current goals can be found in the Care Plan section) Acute Rehab PT Goals Patient Stated Goal: to be able to walk again PT Goal Formulation: With patient Time For Goal Achievement: 10/07/14 Potential to Achieve Goals: Good    Frequency 7X/week   Barriers to discharge Decreased caregiver support;Inaccessible home environment Pt lives alone and has to navigate steps to enter home.    Co-evaluation               End of Session Equipment Utilized During Treatment: Gait belt Activity Tolerance: Patient limited by pain;Patient tolerated treatment well Patient left: in chair;with call bell/phone within reach Nurse Communication: Mobility status         Time: 0952-1030 PT Time Calculation (min) (ACUTE ONLY): 38 min   Charges:   PT Evaluation $Initial PT Evaluation Tier I: 1 Procedure PT Treatments $Therapeutic Exercise: 8-22 mins $Therapeutic Activity: 8-22 mins   PT G Codes:        [name] A [name] 09/23/2014, 11:15 AM  [name], PT, DPT [phone]

## 2014-09-23 NOTE — Progress Notes (Signed)
09/23/14  1815   CM spoke with patient in her room.  Patient reports her son [name] will be providing 24 hr supervision for her at home.  Patient is on the list for [facility] arranged by the doctor's office.  CM called and left voicemail message with Donna of [facility] at [phone] that HHPT/OT, Rolling walker and 3 in 1 are ordered at this time and d/c date is not yet established.

## 2014-09-23 NOTE — Progress Notes (Signed)
Patient ID: PROVIDENCE STIVERS, female   DOB: 10-Jul-1940, 74 y.o.   MRN: [id number]     Subjective:  Patient reports pain as mild to moderate.  Patient states that she is feeling much better than yesterday.  Denies any CP or SOB.  Objective:   VITALS:   Filed Vitals:   09/22/14 1400 09/22/14 2056 09/23/14 0030 09/23/14 0430  BP: 149/67 171/79 148/65 151/62  Pulse: 57 88 97 78  Temp: 97.9 F (36.6 C) 100 F (37.8 C) 99.1 F (37.3 C) 98.2 F (36.8 C)  TempSrc: Oral Oral Oral Oral  Resp: 16 16 16 18   Weight:      SpO2: 99% 98% 96% 98%    ABD soft Sensation intact distally Dorsiflexion/Plantar flexion intact Incision: dressing C/D/I and no drainage Good foot and ankle motion   Lab Results  Component Value Date   WBC 8.1 09/10/2014   HGB 11.8* 09/10/2014   HCT 37.5 09/10/2014   MCV 85.0 09/10/2014   PLT 226 09/10/2014   BMET    Component Value Date/Time   NA 142 09/10/2014 1207   NA 136 03/10/2014 1233   K 3.3* 09/10/2014 1207   K 4.1 03/10/2014 1233   CL 103 09/10/2014 1207   CL 103 03/10/2014 1233   CO2 30 09/10/2014 1207   CO2 28 03/10/2014 1233   GLUCOSE 103* 09/10/2014 1207   GLUCOSE 141* 03/10/2014 1233   BUN 17 09/10/2014 1207   BUN 20* 03/10/2014 1233   CREATININE 1.25* 09/10/2014 1207   CREATININE 1.34* 03/10/2014 1233   CALCIUM 9.3 09/10/2014 1207   CALCIUM 9.5 03/10/2014 1233   GFRNONAA 42* 09/10/2014 1207   GFRNONAA 59* 07/02/2013 0032   GFRAA 48* 09/10/2014 1207   GFRAA >60 07/02/2013 0032     Assessment/Plan: 1 Day Post-Op   Principal Problem:   Primary localized osteoarthritis of left knee Active Problems:   Knee osteoarthritis   Advance diet Up with therapy WBAT Dry dressing PRN Plan for DC tomorrow or Friday   Remonia Richter 09/23/2014, 6:37 AM  Seen and agree.  Plan dc home with hh/pt.  [name], MD Cell [phone]

## 2014-09-23 NOTE — Progress Notes (Signed)
Physical Therapy Treatment Patient Details Name: Kristen [name] MRN: [id number] DOB: [birth year] Today's Date: 09/23/2014    History of Present Illness Patient is a 74 y/o female s/p L TKA. PMH includes HTN, HLD, DM, vertigo, PE, CAD, MI, CVA.    PT Comments    Patient had just gotten back into bed but agreeable to work with therapy. Able to increase ambulation this session and progress with HEP. Continue with current POC  Follow Up Recommendations  Home health PT;Supervision/Assistance - 24 hour     Equipment Recommendations  Rolling walker with 5" wheels;3in1 (PT)    Recommendations for Other Services       Precautions / Restrictions Precautions Precautions: Knee;Fall Precaution Booklet Issued: Yes (comment) Precaution Comments: Reviewed no pillow under knee and Handout. Restrictions Weight Bearing Restrictions: Yes LLE Weight Bearing: Weight bearing as tolerated    Mobility  Bed Mobility Overal bed mobility: Needs Assistance Bed Mobility: Supine to Sit;Sit to Supine     Supine to sit: Min assist Sit to supine: Min assist   General bed mobility comments: A for LE in and out of bed  Transfers Overall transfer level: Needs assistance Equipment used: Rolling walker (2 wheeled) Transfers: Public house manager;Sit to/from Stand Sit to Stand: Min assist         General transfer comment: Min A to power up into standing and cues for safe technique   Ambulation/Gait Ambulation/Gait assistance: Min guard Ambulation Distance (Feet): 75 Feet Assistive device: Rolling walker (2 wheeled) Gait Pattern/deviations: Step-to pattern;Decreased stance time - left;Decreased stance time - right;Trunk flexed   Gait velocity interpretation: Below normal speed for age/gender General Gait Details: Gait more steady. Cues for posture and safe management of RW   Stairs            Wheelchair Mobility    Modified Rankin (Stroke Patients Only)       Balance                                    Cognition Arousal/Alertness: Awake/alert Behavior During Therapy: WFL for tasks assessed/performed Overall Cognitive Status: Within Functional Limits for tasks assessed                      Exercises Total Joint Exercises Quad Sets: Left;Supine;10 reps Heel Slides: AAROM;Left;10 reps Hip ABduction/ADduction: AAROM;Left;Supine;10 reps Straight Leg Raises: AAROM;Left;10 reps    General Comments        Pertinent Vitals/Pain Pain Score: 4  Pain Location: L knee Pain Descriptors / Indicators: Sore Pain Intervention(s): Monitored during session    Home Living Family/patient expects to be discharged to:: Private residence Living Arrangements: Alone Available Help at Discharge: Family;Available PRN/intermittently Type of Home: House Home Access: Stairs to enter Entrance Stairs-Rails: Right Home Layout: One level Home Equipment: Walker - 2 wheels;Bedside commode (Reports both are broken.)      Prior Function Level of Independence: Independent with assistive device(s)      Comments: Pt using RW PTA.   PT Goals (current goals can now be found in the care plan section) Acute Rehab PT Goals Patient Stated Goal: to be able to walk again Progress towards PT goals: Progressing toward goals    Frequency  7X/week    PT Plan Current plan remains appropriate    Co-evaluation             End of Session Equipment Utilized During Treatment:  Gait belt Activity Tolerance: Patient limited by pain;Patient tolerated treatment well Patient left: in bed;with call bell/phone within reach     Time: 1335-1401 PT Time Calculation (min) (ACUTE ONLY): 26 min  Charges:  $Gait Training: 8-22 mins $Therapeutic Exercise: 8-22 mins                    G Codes:      Jacqualyn Posey 09/23/2014, 3:20 PM  09/23/2014 Jacqualyn Posey PTA [phone] pager [phone] office

## 2014-09-23 NOTE — Discharge Instructions (Signed)
INSTRUCTIONS AFTER JOINT REPLACEMENT  ° °o Remove items at home which could result in a fall. This includes throw rugs or furniture in walking pathways °o ICE to the affected joint every three hours while awake for 30 minutes at a time, for at least the first 3-5 days, and then as needed for pain and swelling.  Continue to use ice for pain and swelling. You may notice swelling that will progress down to the foot and ankle.  This is normal after surgery.  Elevate your leg when you are not up walking on it.   °o Continue to use the breathing machine you got in the hospital (incentive spirometer) which will help keep your temperature down.  It is common for your temperature to cycle up and down following surgery, especially at night when you are not up moving around and exerting yourself.  The breathing machine keeps your lungs expanded and your temperature down. ° ° °DIET:  As you were doing prior to hospitalization, we recommend a well-balanced diet. ° °DRESSING / WOUND CARE / SHOWERING ° °You may change your dressing 3-5 days after surgery.  Then change the dressing every day with sterile gauze.  Please use good hand washing techniques before changing the dressing.  Do not use any lotions or creams on the incision until instructed by your surgeon. ° °ACTIVITY ° °o Increase activity slowly as tolerated, but follow the weight bearing instructions below.   °o No driving for 6 weeks or until further direction given by your physician.  You cannot drive while taking narcotics.  °o No lifting or carrying greater than 10 lbs. until further directed by your surgeon. °o Avoid periods of inactivity such as sitting longer than an hour when not asleep. This helps prevent blood clots.  °o You may return to work once you are authorized by your doctor.  ° ° ° °WEIGHT BEARING  ° °Weight bearing as tolerated with assist device (walker, cane, etc) as directed, use it as long as suggested by your surgeon or therapist, typically at  least 4-6 weeks. ° ° °EXERCISES ° °Results after joint replacement surgery are often greatly improved when you follow the exercise, range of motion and muscle strengthening exercises prescribed by your doctor. Safety measures are also important to protect the joint from further injury. Any time any of these exercises cause you to have increased pain or swelling, decrease what you are doing until you are comfortable again and then slowly increase them. If you have problems or questions, call your caregiver or physical therapist for advice.  ° °Rehabilitation is important following a joint replacement. After just a few days of immobilization, the muscles of the leg can become weakened and shrink (atrophy).  These exercises are designed to build up the tone and strength of the thigh and leg muscles and to improve motion. Often times heat used for twenty to thirty minutes before working out will loosen up your tissues and help with improving the range of motion but do not use heat for the first two weeks following surgery (sometimes heat can increase post-operative swelling).  ° °These exercises can be done on a training (exercise) mat, on the floor, on a table or on a bed. Use whatever works the best and is most comfortable for you.    Use music or television while you are exercising so that the exercises are a pleasant break in your day. This will make your life better with the exercises acting as a break   in your routine that you can look forward to.   Perform all exercises about fifteen times, three times per day or as directed.  You should exercise both the operative leg and the other leg as well. ° °Exercises include: °  °• Quad Sets - Tighten up the muscle on the front of the thigh (Quad) and hold for 5-10 seconds.   °• Straight Leg Raises - With your knee straight (if you were given a brace, keep it on), lift the leg to 60 degrees, hold for 3 seconds, and slowly lower the leg.  Perform this exercise against  resistance later as your leg gets stronger.  °• Leg Slides: Lying on your back, slowly slide your foot toward your buttocks, bending your knee up off the floor (only go as far as is comfortable). Then slowly slide your foot back down until your leg is flat on the floor again.  °• Angel Wings: Lying on your back spread your legs to the side as far apart as you can without causing discomfort.  °• Hamstring Strength:  Lying on your back, push your heel against the floor with your leg straight by tightening up the muscles of your buttocks.  Repeat, but this time bend your knee to a comfortable angle, and push your heel against the floor.  You may put a pillow under the heel to make it more comfortable if necessary.  ° °A rehabilitation program following joint replacement surgery can speed recovery and prevent re-injury in the future due to weakened muscles. Contact your doctor or a physical therapist for more information on knee rehabilitation.  ° ° °CONSTIPATION ° °Constipation is defined medically as fewer than three stools per week and severe constipation as less than one stool per week.  Even if you have a regular bowel pattern at home, your normal regimen is likely to be disrupted due to multiple reasons following surgery.  Combination of anesthesia, postoperative narcotics, change in appetite and fluid intake all can affect your bowels.  ° °YOU MUST use at least one of the following options; they are listed in order of increasing strength to get the job done.  They are all available over the counter, and you may need to use some, POSSIBLY even all of these options:   ° °Drink plenty of fluids (prune juice may be helpful) and high fiber foods °Colace 100 mg by mouth twice a day  °Senokot for constipation as directed and as needed Dulcolax (bisacodyl), take with full glass of water  °Miralax (polyethylene glycol) once or twice a day as needed. ° °If you have tried all these things and are unable to have a bowel  movement in the first 3-4 days after surgery call either your surgeon or your primary doctor.   ° °If you experience loose stools or diarrhea, hold the medications until you stool forms back up.  If your symptoms do not get better within 1 week or if they get worse, check with your doctor.  If you experience "the worst abdominal pain ever" or develop nausea or vomiting, please contact the office immediately for further recommendations for treatment. ° ° °ITCHING:  If you experience itching with your medications, try taking only a single pain pill, or even half a pain pill at a time.  You can also use Benadryl over the counter for itching or also to help with sleep.  ° °TED HOSE STOCKINGS:  Use stockings on both legs until for at least 2 weeks or as   directed by physician office. They may be removed at night for sleeping. ° °MEDICATIONS:  See your medication summary on the “After Visit Summary” that nursing will review with you.  You may have some home medications which will be placed on hold until you complete the course of blood thinner medication.  It is important for you to complete the blood thinner medication as prescribed. ° °PRECAUTIONS:  If you experience chest pain or shortness of breath - call 911 immediately for transfer to the hospital emergency department.  ° °If you develop a fever greater that 101 F, purulent drainage from wound, increased redness or drainage from wound, foul odor from the wound/dressing, or calf pain - CONTACT YOUR SURGEON.   °                                                °FOLLOW-UP APPOINTMENTS:  If you do not already have a post-op appointment, please call the office for an appointment to be seen by your surgeon.  Guidelines for how soon to be seen are listed in your “After Visit Summary”, but are typically between 1-4 weeks after surgery. ° °OTHER INSTRUCTIONS:  ° °Knee Replacement:  Do not place pillow under knee, focus on keeping the knee straight while resting. CPM  instructions: 0-90 degrees, 2 hours in the morning, 2 hours in the afternoon, and 2 hours in the evening. Place foam block, curve side up under heel at all times except when in CPM or when walking.  DO NOT modify, tear, cut, or change the foam block in any way. ° °MAKE SURE YOU:  °• Understand these instructions.  °• Get help right away if you are not doing well or get worse.  ° ° °Thank you for letting us be a part of your medical care team.  It is a privilege we respect greatly.  We hope these instructions will help you stay on track for a fast and full recovery!  ° °Information on my medicine - XARELTO® (Rivaroxaban) ° °This medication education was reviewed with me or my healthcare representative as part of my discharge preparation.  The pharmacist that spoke with me during my hospital stay was:  Hazleigh Mccleave P, RPH ° °Why was Xarelto® prescribed for you? °Xarelto® was prescribed for you to reduce the risk of blood clots forming after orthopedic surgery. The medical term for these abnormal blood clots is venous thromboembolism (VTE). ° °What do you need to know about xarelto® ? °Take your Xarelto® ONCE DAILY at the same time every day. °You may take it either with or without food. ° °If you have difficulty swallowing the tablet whole, you may crush it and mix in applesauce just prior to taking your dose. ° °Take Xarelto® exactly as prescribed by your doctor and DO NOT stop taking Xarelto® without talking to the doctor who prescribed the medication.  Stopping without other VTE prevention medication to take the place of Xarelto® may increase your risk of developing a clot. ° °After discharge, you should have regular check-up appointments with your healthcare provider that is prescribing your Xarelto®.   ° °What do you do if you miss a dose? °If you miss a dose, take it as soon as you remember on the same day then continue your regularly scheduled once daily regimen the next day. Do not take two doses of   Xarelto®  on the same day.  ° °Important Safety Information °A possible side effect of Xarelto® is bleeding. You should call your healthcare provider right away if you experience any of the following: °? Bleeding from an injury or your nose that does not stop. °? Unusual colored urine (red or dark brown) or unusual colored stools (red or black). °? Unusual bruising for unknown reasons. °? A serious fall or if you hit your head (even if there is no bleeding). ° °Some medicines may interact with Xarelto® and might increase your risk of bleeding while on Xarelto®. To help avoid this, consult your healthcare provider or pharmacist prior to using any new prescription or non-prescription medications, including herbals, vitamins, non-steroidal anti-inflammatory drugs (NSAIDs) and supplements. ° °This website has more information on Xarelto®: www.xarelto.com. ° ° °

## 2014-09-23 NOTE — Evaluation (Signed)
Occupational Therapy Evaluation Patient Details Name: Kristen [name] MRN: [id number] DOB: 1941/04/02 Today's Date: 09/23/2014    History of Present Illness Patient is a 74 y/o female s/p L TKA. PMH includes HTN, HLD, DM, vertigo, PE, CAD, MI, CVA.   Clinical Impression   Pt is s/p TKA resulting in the deficits listed below (see OT Problem List).  Pt will benefit from skilled OT to increase their safety and independence with ADL and functional mobility for ADL to facilitate discharge to venue listed below.        Follow Up Recommendations  Home health OT    Equipment Recommendations  None recommended by OT    Recommendations for Other Services       Precautions / Restrictions Precautions Precautions: Knee;Fall Precaution Booklet Issued: Yes (comment) Precaution Comments: Reviewed no pillow under knee and Handout. Restrictions Weight Bearing Restrictions: Yes LLE Weight Bearing: Weight bearing as tolerated      Mobility Bed Mobility Overal bed mobility: Needs Assistance Bed Mobility: Supine to Sit     Supine to sit: Min assist;HOB elevated     General bed mobility comments: pt in chair  Transfers Overall transfer level: Needs assistance Equipment used: Rolling walker (2 wheeled) Transfers: Sit to/from Stand Sit to Stand: Min assist         General transfer comment: Min A to boost from EOB x1, from toilet x1. + nausea upon standing. Transferred to chair post ambulation bout.    Balance Overall balance assessment: Needs assistance Sitting-balance support: Feet supported;No upper extremity supported Sitting balance-Leahy Scale: Fair     Standing balance support: During functional activity Standing balance-Leahy Scale: Poor Standing balance comment: Relient on RW for support.                             ADL Overall ADL's : Needs assistance/impaired     Grooming: Set up;Sitting   Upper Body Bathing: Supervision/ safety;Sitting   Lower  Body Bathing: Moderate assistance;Sit to/from stand   Upper Body Dressing : Set up;Sitting   Lower Body Dressing: Moderate assistance;Sit to/from stand   Toilet Transfer: Minimal assistance;RW;Ambulation;Cueing for sequencing;Cueing for safety;BSC   Toileting- Clothing Manipulation and Hygiene: Sit to/from stand;Minimal assistance       Functional mobility during ADLs: Minimal assistance;Rolling walker   Pt feeling sick during OT eval. RN aware. Pt feels it is pain meds. Provided crackers.                 Pertinent Vitals/Pain Pain Assessment: 0-10 Pain Score: 7  Pain Location: l knee Pain Descriptors / Indicators: Aching;Sore Pain Intervention(s): Limited activity within patient's tolerance;Monitored during session;Repositioned;Ice applied     Hand Dominance     Extremity/Trunk Assessment Upper Extremity Assessment Upper Extremity Assessment: Generalized weakness   Lower Extremity Assessment Lower Extremity Assessment: LLE deficits/detail LLE Deficits / Details: Limited AROM hip/knee secondary to pain/surgery. LLE Sensation:  [facility].)       Communication Communication Communication: No difficulties   Cognition Arousal/Alertness: Awake/alert Behavior During Therapy: WFL for tasks assessed/performed Overall Cognitive Status: Within Functional Limits for tasks assessed (Concern about safety?)                                Home Living Family/patient expects to be discharged to:: Private residence Living Arrangements: Alone Available Help at Discharge: Family;Available PRN/intermittently Type of Home: House Home Access: Stairs to  enter Entrance Stairs-Number of Steps: 2 Entrance Stairs-Rails: Right Home Layout: One level               Home Equipment: Walker - 2 wheels;Bedside commode (Reports both are broken.)          Prior Functioning/Environment Level of Independence: Independent with assistive device(s)        Comments: Pt using  RW PTA.    OT Diagnosis: Generalized weakness;Acute pain   OT Problem List: Decreased strength;Decreased activity tolerance;Pain   OT Treatment/Interventions: Self-care/ADL training;DME and/or AE instruction;Patient/family education    OT Goals(Current goals can be found in the care plan section) Acute Rehab OT Goals Patient Stated Goal: to be able to walk again OT Goal Formulation: With patient Time For Goal Achievement: 10/07/14 ADL Goals Pt Will Perform Lower Body Dressing: with supervision;sit to/from stand Pt Will Transfer to Toilet: with supervision;ambulating;regular height toilet Pt Will Perform Toileting - Clothing Manipulation and hygiene: with supervision;sit to/from stand  OT Frequency: Min 2X/week   Barriers to D/C:               End of Session Equipment Utilized During Treatment: Surveyor, mining Communication: Mobility status;Other (comment) (need for nausea meds.)  Activity Tolerance: Patient tolerated treatment well Patient left: in chair;with call bell/phone within reach   Time: 1120-1144 OT Time Calculation (min): 24 min Charges:  OT General Charges $OT Visit: 1 Procedure OT Evaluation $Initial OT Evaluation Tier I: 1 Procedure OT Treatments $Self Care/Home Management : 8-22 mins G-Codes:    Payton Mccallum D 09-26-14, 11:56 AM

## 2014-09-24 LAB — CBC
HCT: 33.1 % — ABNORMAL LOW (ref 36.0–46.0)
Hemoglobin: 10.6 g/dL — ABNORMAL LOW (ref 12.0–15.0)
MCH: 26.8 pg (ref 26.0–34.0)
MCHC: 32 g/dL (ref 30.0–36.0)
MCV: 83.8 fL (ref 78.0–100.0)
Platelets: 214 10*3/uL (ref 150–400)
RBC: 3.95 MIL/uL (ref 3.87–5.11)
RDW: 14.9 % (ref 11.5–15.5)
WBC: 11.2 10*3/uL — AB (ref 4.0–10.5)

## 2014-09-24 LAB — GLUCOSE, CAPILLARY
Glucose-Capillary: 180 mg/dL — ABNORMAL HIGH (ref 65–99)
Glucose-Capillary: 156 mg/dL — ABNORMAL HIGH (ref 65–99)
Glucose-Capillary: 116 mg/dL — ABNORMAL HIGH (ref 65–99)
Glucose-Capillary: 187 mg/dL — ABNORMAL HIGH (ref 65–99)

## 2014-09-24 NOTE — Progress Notes (Signed)
Occupational Therapy Treatment Patient Details Name: Kristen [name] MRN: [id number] DOB: 1941-04-03 Today's Date: 09/24/2014    History of present illness Patient is a 74 y/o female s/p L TKA. PMH includes HTN, HLD, DM, vertigo, PE, CAD, MI, CVA.   OT comments  Patient making good progress towards goals, continue plan of care for now. Pt takes extra time to complete tasks, but does so safely. Pt states she lives alone, but will have intermittent assistance from family. Recommending supervision during showers and IADL tasks at this time.    Follow Up Recommendations  Home health OT;Supervision - Intermittent    Equipment Recommendations  3 in 1 bedside comode    Recommendations for Other Services  None at this time  Precautions / Restrictions Precautions Precautions: Knee;Fall Precaution Comments: Reviewed no pillow under knee Required Braces or Orthoses: Knee Immobilizer - Left Knee Immobilizer - Left: On when out of bed or walking Restrictions Weight Bearing Restrictions: Yes LLE Weight Bearing: Weight bearing as tolerated       Mobility Bed Mobility Overal bed mobility: Needs Assistance Bed Mobility: Supine to Sit     Supine to sit: Supervision     General bed mobility comments: Extra time needed. No physical assistance.   Transfers Overall transfer level: Needs assistance Equipment used: Rolling walker (2 wheeled) Transfers: Sit to/from Stand Sit to Stand: Supervision  General transfer comment: Close supervision for safety, extra time needed.     Balance Overall balance assessment: Needs assistance Sitting-balance support: No upper extremity supported;Feet supported Sitting balance-Leahy Scale: Good     Standing balance support: Bilateral upper extremity supported;During functional activity Standing balance-Leahy Scale: Fair   ADL Overall ADL's : Needs assistance/impaired Toilet Transfer: Supervision/safety;RW;BSC;Cueing for safety;Grab bars Functional  mobility during ADLs: Supervision/safety;Rolling walker General ADL Comments: Pt takes more than a reasonable amount of time to complete tasks, but overall does so in a safe manner. Pt supervision for functional mobility, continues to require up to mod assist for LB ADLs. Pt states that she plans to stay alone and her daughter in law will assist with ADLs post acute d/c. Continue to recommend [place].      Cognition   Behavior During Therapy: WFL for tasks assessed/performed Overall Cognitive Status: Within Functional Limits for tasks assessed                 Pertinent Vitals/ Pain       Pain Assessment: Faces Faces Pain Scale: Hurts little more Pain Location: left knee with mobility Pain Descriptors / Indicators: Grimacing Pain Intervention(s): Monitored during session         Frequency Min 2X/week     Progress Toward Goals  OT Goals(current goals can now be found in the care plan section)  Progress towards OT goals: Progressing toward goals     Plan Discharge plan remains appropriate    End of Session Equipment Utilized During Treatment: Rolling walker;Left knee immobilizer   Activity Tolerance Patient tolerated treatment well   Patient Left in chair;with call bell/phone within reach [facility]; PT aware )    Time: 6962-9528 OT Time Calculation (min): 19 min  Charges: OT General Charges $OT Visit: 1 Procedure OT Treatments $Self Care/Home Management : 8-22 mins  Angelys Yetman , MS, OTR/L, CLT Pager: [phone]  09/24/2014, 11:18 AM

## 2014-09-24 NOTE — Discharge Summary (Signed)
Physician Discharge Summary  Patient ID: SHAKARI QAZI MRN: [id number] DOB/AGE: July 23, 1940 74 y.o.  Admit date: 09/22/2014 Discharge date: 09/25/2014  Admission Diagnoses:  Primary localized osteoarthritis of left knee  Discharge Diagnoses:  Principal Problem:   Primary localized osteoarthritis of left knee Active Problems:   Knee osteoarthritis   Past Medical History  Diagnosis Date  . HTN (hypertension)   . Diabetes type 2, controlled   . HLD (hyperlipidemia)   . Morbid obesity   . Vertigo     hx. benign postitional postural  . Internal capsule hemorrhage     hx of sublacunar infarct involving the right posterior limb of the internal capsule   . Osteoarthritis     knees  . Frequent headaches   . History of pulmonary embolism 2012  . CAD (coronary artery disease)     by CT, per pt h/o MI  . Myocardial infarction 2012    per pt. report, states she was treated with medicine  , [facility] at [facility]    . Stroke     still has balance problem on occas. , uses cane but that's mainly for the left knee pain  . Sleep apnea 2011    study done in [place], states that since she lost weight she doesn't use the CPAP any longer & she doesn't have a problem with sleep apnea   . Shortness of breath dyspnea     yes-relative to pain   . Primary localized osteoarthritis of left knee 09/22/2014    Surgeries: Procedure(s): LEFT TOTAL KNEE ARTHROPLASTY on 09/22/2014   Consultants (if any):    Discharged Condition: Improved  Hospital Course: CHASSIDY LAYSON is an 74 y.o. female who was admitted 09/22/2014 with a diagnosis of Primary localized osteoarthritis of left knee and went to the operating room on 09/22/2014 and underwent the above named procedures.    She was given perioperative antibiotics:  Anti-infectives    Start     Dose/Rate Route Frequency Ordered Stop   09/22/14 1415  ceFAZolin (ANCEF) IVPB 2 g/50 mL premix     2 g 100 mL/hr over 30 Minutes Intravenous Every 6 hours 09/22/14  1406 09/22/14 2241   09/22/14 0715  ceFAZolin (ANCEF) IVPB 2 g/50 mL premix     2 g 100 mL/hr over 30 Minutes Intravenous To Surgery 09/21/14 0855 09/22/14 0756    .  She was given sequential compression devices, early ambulation, and xarelto for DVT prophylaxis.  She benefited maximally from the hospital stay and there were no complications.    Recent vital signs:  Filed Vitals:   09/24/14 0536  BP: 168/87  Pulse: 90  Temp: 99.6 F (37.6 C)  Resp: 17    Recent laboratory studies:  Lab Results  Component Value Date   HGB 10.6* 09/24/2014   HGB 11.2* 09/23/2014   HGB 11.8* 09/10/2014   Lab Results  Component Value Date   WBC 11.2* 09/24/2014   PLT 214 09/24/2014   Lab Results  Component Value Date   INR 1.1 07/02/2013   Lab Results  Component Value Date   NA 135 09/23/2014   K 3.7 09/23/2014   CL 96* 09/23/2014   CO2 26 09/23/2014   BUN 11 09/23/2014   CREATININE 1.00 09/23/2014   GLUCOSE 219* 09/23/2014    Discharge Medications:     Medication List    STOP taking these medications        acetaminophen 500 MG tablet  Commonly known as:  TYLENOL  ASPIR-81 81 MG EC tablet  Generic drug:  aspirin     HYDROcodone-acetaminophen 5-325 MG per tablet  Commonly known as:  NORCO/VICODIN      TAKE these medications        amLODipine 5 MG tablet  Commonly known as:  NORVASC  TAKE 1 TABLET (5 MG TOTAL) BY MOUTH DAILY.     baclofen 10 MG tablet  Commonly known as:  LIORESAL  Take 1 tablet (10 mg total) by mouth 3 (three) times daily. As needed for muscle spasm     glimepiride 2 MG tablet  Commonly known as:  AMARYL  Take 1 tablet (2 mg total) by mouth daily with breakfast.     glucose blood test strip  250.02 Use as instructed     losartan 100 MG tablet  Commonly known as:  COZAAR  Take 1 tablet (100 mg total) by mouth daily.     MULTIVITAMIN/IRON PO  Take 1 tablet by mouth daily.     nitroGLYCERIN 0.4 MG SL tablet  Commonly known as:   NITROSTAT  Place 0.4 mg under the tongue every 5 (five) minutes as needed for chest pain.     ondansetron 4 MG tablet  Commonly known as:  ZOFRAN  Take 1 tablet (4 mg total) by mouth every 8 (eight) hours as needed for nausea or vomiting.     ONE TOUCH ULTRA SYSTEM KIT W/DEVICE Kit  1 kit by Does not apply route once.     oxyCODONE-acetaminophen 10-325 MG per tablet  Commonly known as:  PERCOCET  Take 1-2 tablets by mouth every 6 (six) hours as needed for pain. MAXIMUM TOTAL ACETAMINOPHEN DOSE IS 4000 MG PER DAY     rivaroxaban 10 MG Tabs tablet  Commonly known as:  XARELTO  Take 1 tablet (10 mg total) by mouth daily.     sennosides-docusate sodium 8.6-50 MG tablet  Commonly known as:  SENOKOT-S  Take 2 tablets by mouth daily.     simvastatin 20 MG tablet  Commonly known as:  ZOCOR  TAKE 1 TABLET (20 MG TOTAL) BY MOUTH AT BEDTIME.        Diagnostic Studies: Dg Knee Left Port  09/22/2014   CLINICAL DATA:  Status post total knee arthroplasty  EXAM: PORTABLE LEFT KNEE - 1-2 VIEW  COMPARISON:  January 27, 2014  FINDINGS: Frontal and lateral views were obtained. Patient is status post total knee replacement with femoral and tibial components appearing well-seated. No acute fracture or dislocation. Air within the joint is an expected postoperative finding.  IMPRESSION: Prosthetic components appear well seated. No fracture or dislocation.   Electronically Signed   By: Lowella Grip III M.D.   On: 09/22/2014 13:47    Disposition: SNF        Follow-up Information    Follow up with Johnny Bridge, MD. Schedule an appointment as soon as possible for a visit in 2 weeks.   Specialty:  Orthopedic Surgery   Contact information:   [place] [address] [phone]        Signed: Johnny Bridge 09/24/2014, 9:07 AM

## 2014-09-24 NOTE — Progress Notes (Signed)
Physical Therapy Treatment Patient Details Name: Kristen [name] MRN: [id number] DOB: 05/28/1940 Today's Date: 09/24/2014    History of Present Illness Patient is a 74 y/o female s/p L TKA. PMH includes HTN, HLD, DM, vertigo, PE, CAD, MI, CVA.    PT Comments    Patient continues to make steady progress with mobility. Able to increase ambulation this morning. Continue working on ambulation and HEP. Patient is planning to DC home tomorrow  Follow Up Recommendations  Home health PT;Supervision/Assistance - 24 hour     Equipment Recommendations  Rolling walker with 5" wheels;3in1 (PT)    Recommendations for Other Services       Precautions / Restrictions Precautions Precautions: Knee;Fall Precaution Comments: Reviewed no pillow under knee Required Braces or Orthoses: Knee Immobilizer - Left Knee Immobilizer - Left: On when out of bed or walking Restrictions Weight Bearing Restrictions: Yes LLE Weight Bearing: Weight bearing as tolerated    Mobility  Bed Mobility Overal bed mobility: Needs Assistance Bed Mobility: Supine to Sit     Supine to sit: Supervision     General bed mobility comments: Patient in restroom upon arrival   Transfers Overall transfer level: Needs assistance Equipment used: Rolling walker (2 wheeled) Transfers: Sit to/from Stand Sit to Stand: Supervision         General transfer comment: Close supervision for safety, extra time needed.   Ambulation/Gait Ambulation/Gait assistance: Supervision Ambulation Distance (Feet): 100 Feet Assistive device: Rolling walker (2 wheeled) Gait Pattern/deviations: Step-through pattern;Decreased stride length   Gait velocity interpretation: Below normal speed for age/gender General Gait Details: Cues for posture. One seated rest break. Cues for safety. Patient request to leave KI on for comfort.    Stairs            Wheelchair Mobility    Modified Rankin (Stroke Patients Only)       Balance  Overall balance assessment: Needs assistance Sitting-balance support: No upper extremity supported;Feet supported Sitting balance-Leahy Scale: Good     Standing balance support: Bilateral upper extremity supported;During functional activity Standing balance-Leahy Scale: Fair                      Cognition Arousal/Alertness: Awake/alert Behavior During Therapy: WFL for tasks assessed/performed Overall Cognitive Status: Within Functional Limits for tasks assessed                      Exercises Total Joint Exercises Quad Sets: Left;10 reps Heel Slides: AAROM;Left;10 reps Hip ABduction/ADduction: AAROM;Left;Supine;10 reps Straight Leg Raises: AAROM;Left;10 reps    General Comments        Pertinent Vitals/Pain Pain Assessment: Faces Pain Score: 2  Faces Pain Scale: Hurts little more Pain Location: Left knee with mobility Pain Descriptors / Indicators: Grimacing Pain Intervention(s): Monitored during session    Home Living                      Prior Function            PT Goals (current goals can now be found in the care plan section) Progress towards PT goals: Progressing toward goals    Frequency  7X/week    PT Plan Current plan remains appropriate    Co-evaluation             End of Session Equipment Utilized During Treatment: Gait belt Activity Tolerance: Patient tolerated treatment well Patient left: in chair;with call bell/phone within reach     Time: 7017-7939  PT Time Calculation (min) (ACUTE ONLY): 29 min  Charges:  $Gait Training: 8-22 mins $Therapeutic Exercise: 8-22 mins                    G Codes:      Jacqualyn Posey 09/24/2014, 12:05 PM 09/24/2014 Jacqualyn Posey PTA [phone] pager [phone] office

## 2014-09-24 NOTE — Progress Notes (Signed)
PT Cancellation Note  Patient Details Name: Kristen [name] MRN: [id number] DOB: [birth year]   Cancelled Treatment:    Reason Eval/Treat Not Completed: Other (comment). Attempted to see patient for second session this afternoon, however patient politely declining stating that she had just gotten back to bed and wished to rest. Will follow up in AM   Shaneeka Scarboro, Tonia Brooms 09/24/2014, 2:58 PM

## 2014-09-24 NOTE — Progress Notes (Signed)
Patient ID: Kristen [name], female   DOB: [birth year], 74 y.o.   MRN: [id number]     Subjective:  Patient reports pain as mild.  Patient states that she is better than yesterday.  Denies any CP or SOB  Objective:   VITALS:   Filed Vitals:   09/23/14 1119 09/23/14 1320 09/23/14 2045 09/24/14 0536  BP: 151/62 190/77 165/77 168/87  Pulse:  84 88 90  Temp:  98.7 F (37.1 C) 100 F (37.8 C) 99.6 F (37.6 C)  TempSrc:    Oral  Resp:  16 16 17   Weight:      SpO2:  99% 99% 99%    ABD soft Sensation intact distally Dorsiflexion/Plantar flexion intact Incision: dressing C/D/I and no drainage Dressing removed and the wound is clean and dry No sign of infection Dry dressing applied  Lab Results  Component Value Date   WBC 11.2* 09/24/2014   HGB 10.6* 09/24/2014   HCT 33.1* 09/24/2014   MCV 83.8 09/24/2014   PLT 214 09/24/2014   BMET    Component Value Date/Time   NA 135 09/23/2014 0655   NA 136 03/10/2014 1233   K 3.7 09/23/2014 0655   K 4.1 03/10/2014 1233   CL 96* 09/23/2014 0655   CL 103 03/10/2014 1233   CO2 26 09/23/2014 0655   CO2 28 03/10/2014 1233   GLUCOSE 219* 09/23/2014 0655   GLUCOSE 141* 03/10/2014 1233   BUN 11 09/23/2014 0655   BUN 20* 03/10/2014 1233   CREATININE 1.00 09/23/2014 0655   CREATININE 1.34* 03/10/2014 1233   CALCIUM 8.6* 09/23/2014 0655   CALCIUM 9.5 03/10/2014 1233   GFRNONAA 55* 09/23/2014 0655   GFRNONAA 59* 07/02/2013 0032   GFRAA >60 09/23/2014 0655   GFRAA >60 07/02/2013 0032     Assessment/Plan: 2 Days Post-Op   Principal Problem:   Primary localized osteoarthritis of left knee Active Problems:   Knee osteoarthritis   Advance diet Up with therapy Plan for discharge tomorrow  WBAT Dry dressing PRN    Remonia Richter 09/24/2014, 6:56 AM  Seen and agree.  SNF fri.  No family support.  [name], MD Cell [phone]

## 2014-09-25 DIAGNOSIS — E119 Type 2 diabetes mellitus without complications: Secondary | ICD-10-CM | POA: Diagnosis not present

## 2014-09-25 DIAGNOSIS — E785 Hyperlipidemia, unspecified: Secondary | ICD-10-CM | POA: Diagnosis not present

## 2014-09-25 DIAGNOSIS — Z96652 Presence of left artificial knee joint: Secondary | ICD-10-CM | POA: Diagnosis not present

## 2014-09-25 DIAGNOSIS — E1165 Type 2 diabetes mellitus with hyperglycemia: Secondary | ICD-10-CM | POA: Diagnosis not present

## 2014-09-25 DIAGNOSIS — R278 Other lack of coordination: Secondary | ICD-10-CM | POA: Diagnosis not present

## 2014-09-25 DIAGNOSIS — I639 Cerebral infarction, unspecified: Secondary | ICD-10-CM | POA: Diagnosis not present

## 2014-09-25 DIAGNOSIS — S78112A Complete traumatic amputation at level between left hip and knee, initial encounter: Secondary | ICD-10-CM | POA: Diagnosis not present

## 2014-09-25 DIAGNOSIS — K5900 Constipation, unspecified: Secondary | ICD-10-CM | POA: Diagnosis not present

## 2014-09-25 DIAGNOSIS — I10 Essential (primary) hypertension: Secondary | ICD-10-CM | POA: Diagnosis not present

## 2014-09-25 DIAGNOSIS — M1712 Unilateral primary osteoarthritis, left knee: Secondary | ICD-10-CM | POA: Diagnosis not present

## 2014-09-25 DIAGNOSIS — D62 Acute posthemorrhagic anemia: Secondary | ICD-10-CM | POA: Diagnosis not present

## 2014-09-25 DIAGNOSIS — Z471 Aftercare following joint replacement surgery: Secondary | ICD-10-CM | POA: Diagnosis not present

## 2014-09-25 DIAGNOSIS — R2681 Unsteadiness on feet: Secondary | ICD-10-CM | POA: Diagnosis not present

## 2014-09-25 DIAGNOSIS — I252 Old myocardial infarction: Secondary | ICD-10-CM | POA: Diagnosis not present

## 2014-09-25 DIAGNOSIS — I259 Chronic ischemic heart disease, unspecified: Secondary | ICD-10-CM | POA: Diagnosis not present

## 2014-09-25 LAB — CBC
HEMATOCRIT: 32.7 % — AB (ref 36.0–46.0)
HEMOGLOBIN: 10.3 g/dL — AB (ref 12.0–15.0)
MCH: 26.7 pg (ref 26.0–34.0)
MCHC: 31.5 g/dL (ref 30.0–36.0)
MCV: 84.7 fL (ref 78.0–100.0)
PLATELETS: 228 10*3/uL (ref 150–400)
RBC: 3.86 MIL/uL — ABNORMAL LOW (ref 3.87–5.11)
RDW: 15.2 % (ref 11.5–15.5)
WBC: 9.2 10*3/uL (ref 4.0–10.5)

## 2014-09-25 LAB — GLUCOSE, CAPILLARY
GLUCOSE-CAPILLARY: 180 mg/dL — AB (ref 65–99)
Glucose-Capillary: 150 mg/dL — ABNORMAL HIGH (ref 65–99)

## 2014-09-25 NOTE — Progress Notes (Signed)
Patient ID: Kristen [name], female   DOB: [birth year], 74 y.o.   MRN: [id number]     Subjective:  Patient reports pain as mild.  Patient states that she would like to go home and that her Son will be there with her.  Denies any CP or SOB  Objective:   VITALS:   Filed Vitals:   09/24/14 1400 09/24/14 2011 09/24/14 2100 09/25/14 0404  BP: 155/62 163/69 151/72 158/59  Pulse: 78 74 81 66  Temp: 99.6 F (37.6 C) 99.7 F (37.6 C)  98 F (36.7 C)  TempSrc:  Oral    Resp: 16 16  16   Weight:      SpO2: 100% 99%  99%    ABD soft Sensation intact distally Dorsiflexion/Plantar flexion intact Incision: dressing C/D/I and no drainage Good foot and ankle motion  Lab Results  Component Value Date   WBC 11.2* 09/24/2014   HGB 10.6* 09/24/2014   HCT 33.1* 09/24/2014   MCV 83.8 09/24/2014   PLT 214 09/24/2014   BMET    Component Value Date/Time   NA 135 09/23/2014 0655   NA 136 03/10/2014 1233   K 3.7 09/23/2014 0655   K 4.1 03/10/2014 1233   CL 96* 09/23/2014 0655   CL 103 03/10/2014 1233   CO2 26 09/23/2014 0655   CO2 28 03/10/2014 1233   GLUCOSE 219* 09/23/2014 0655   GLUCOSE 141* 03/10/2014 1233   BUN 11 09/23/2014 0655   BUN 20* 03/10/2014 1233   CREATININE 1.00 09/23/2014 0655   CREATININE 1.34* 03/10/2014 1233   CALCIUM 8.6* 09/23/2014 0655   CALCIUM 9.5 03/10/2014 1233   GFRNONAA 55* 09/23/2014 0655   GFRNONAA 59* 07/02/2013 0032   GFRAA >60 09/23/2014 0655   GFRAA >60 07/02/2013 0032     Assessment/Plan: 3 Days Post-Op   Principal Problem:   Primary localized osteoarthritis of left knee Active Problems:   Knee osteoarthritis   Advance diet Up with therapy Discharge home with home health if patient has at home support if not patient will need SNF placement WBAT Dry dressing PRN   Kristen [name] 09/25/2014, 6:54 AM  Discussed and agree with above. Discharge home today.  Kristen Bond, MD Cell [phone]

## 2014-09-25 NOTE — Progress Notes (Signed)
Physical Therapy Treatment Patient Details Name: Kristen [name] MRN: [id number] DOB: [birth year] Today's Date: 09/25/2014    History of Present Illness Patient is a 74 y/o female s/p L TKA. PMH includes HTN, HLD, DM, vertigo, PE, CAD, MI, CVA.    PT Comments    Patient progressing well and able to complete stair training this AM. Patient safe to D/C from a mobility standpoint based on progression towards goals set on PT eval.    Follow Up Recommendations  Home health PT;Supervision/Assistance - 24 hour     Equipment Recommendations  Rolling walker with 5" wheels;3in1 (PT)    Recommendations for Other Services       Precautions / Restrictions Precautions Precautions: Knee;Fall Required Braces or Orthoses: Knee Immobilizer - Left Knee Immobilizer - Left: On when out of bed or walking Restrictions LLE Weight Bearing: Weight bearing as tolerated    Mobility  Bed Mobility Overal bed mobility: Modified Independent                Transfers Overall transfer level: Needs assistance Equipment used: Rolling walker (2 wheeled)   Sit to Stand: Supervision         General transfer comment: Close supervision for safety  Ambulation/Gait Ambulation/Gait assistance: Supervision Ambulation Distance (Feet): 250 Feet Assistive device: Rolling walker (2 wheeled)     Gait velocity interpretation: Below normal speed for age/gender General Gait Details: Patient required cues to speed up ambulation. Overall safe technique with RW   Stairs Stairs: Yes Stairs assistance: Min guard Stair Management: Step to pattern;Sideways;One rail Left Number of Stairs: 2 General stair comments: Cues for sequence and technique   Wheelchair Mobility    Modified Rankin (Stroke Patients Only)       Balance                                    Cognition Arousal/Alertness: Awake/alert Behavior During Therapy: WFL for tasks assessed/performed Overall Cognitive Status:  Within Functional Limits for tasks assessed                      Exercises      General Comments        Pertinent Vitals/Pain Pain Score: 2  Pain Location: L knee Pain Descriptors / Indicators: Aching;Sore Pain Intervention(s): Monitored during session    Home Living                      Prior Function            PT Goals (current goals can now be found in the care plan section) Progress towards PT goals: Progressing toward goals    Frequency  7X/week    PT Plan Current plan remains appropriate    Co-evaluation             End of Session Equipment Utilized During Treatment: Gait belt Activity Tolerance: Patient tolerated treatment well Patient left: in chair;with call bell/phone within reach     Time: 0735-0800 PT Time Calculation (min) (ACUTE ONLY): 25 min  Charges:  $Gait Training: 8-22 mins $Therapeutic Activity: 8-22 mins                    G Codes:      Jacqualyn Posey 09/25/2014, 11:55 AM 09/25/2014 Jacqualyn Posey PTA [phone] pager [phone] office

## 2014-09-25 NOTE — Discharge Planning (Signed)
Patient to be discharged to [facility]. Patient updated at bedside.  Facility: [facility] RN report number: [phone] Transportation: EMS ([facility]AR)  Malachi Kinzler, LCSWA - [phone] Clinical Social Work Department Orthopedics (5N9-32) and Surgical (6N24-32)   

## 2014-09-25 NOTE — Clinical Social Work Note (Signed)
Clinical Social Work Assessment  Patient Details  Name: Kristen [name] MRN: [id number] Date of Birth: [birth year]  Date of referral:  09/25/14               Reason for consult:  Facility Placement, Discharge Planning                Permission sought to share information with:  Facility Sport and exercise psychologist Permission granted to share information::  Yes, Verbal Permission Granted  Name::        Agency::  [facility]  Relationship::     Contact Information:  [phone]  Housing/Transportation Living arrangements for the past 2 months:  [place] of Information:  Patient Patient Interpreter Needed:  None Criminal Activity/Legal Involvement Pertinent to Current Situation/Hospitalization:  No - Comment as needed Significant Relationships:  None Lives with:  Self Do you feel safe going back to the place where you live?  No (High fall risk) Need for family participation in patient care:  No (Coment)  Care giving concerns:  Patient expressed no concerns at this time.   Social Worker assessment / plan:  CSW met with patient to discuss discharge disposition. Patient informed CSW patient would prefer to discharge to [facility] once medically stable. Referral initiated. Patient to be discharged to [facility] via EMS.  Employment status:  Retired Nurse, adult PT Recommendations:  Home with [place] / Referral to community resources:  [place]  Patient/Family's Response to care:  Patient understanding and agreeable to CSW plan of care.  Patient/Family's Understanding of and Emotional Response to Diagnosis, Current Treatment, and Prognosis:  Patient understanding and agreeable to CSW plan of care.  Emotional Assessment Appearance:  Appears younger than stated age Attitude/Demeanor/Rapport:  Other (Pleasant) Affect (typically observed):  Accepting, Appropriate, Pleasant Orientation:  Oriented to Self,  Oriented to Place, Oriented to  Time, Oriented to Situation Alcohol / Substance use:  Not Applicable Psych involvement (Current and /or in the community):  No (Comment) (Not appropriate on this admission)  Discharge Needs  Concerns to be addressed:  No discharge needs identified Readmission within the last 30 days:  No Current discharge risk:  None Barriers to Discharge:  No Barriers Identified   Caroline Sauger, LCSW 09/25/2014, 1:07 PM [phone]

## 2014-09-25 NOTE — Clinical Social Work Placement (Signed)
   CLINICAL SOCIAL WORK PLACEMENT  NOTE  Date:  09/25/2014  Patient Details  Name: Kristen [name] MRN: [id number] Date of Birth: May 19, 1940  Clinical Social Work is seeking post-discharge placement for this patient at the [place] level of care (*CSW will initial, date and re-position this form in  chart as items are completed):  Yes   Patient/family provided with [facility]'s list of facilities offering this level of care within the geographic area requested by the patient (or if unable, by the patient's family).  Yes   Patient/family informed of their freedom to choose among providers that offer the needed level of care, that participate in Medicare, Medicaid or managed care program needed by the patient, have an available bed and are willing to accept the patient.  Yes   Patient/family informed of [facility]'s ownership interest in [facility] and [facility], as well as of the fact that they are under no obligation to receive care at these facilities.  PASRR submitted to EDS on 09/25/14     PASRR number received on 09/25/14     Existing PASRR number confirmed on       FL2 transmitted to all facilities in geographic area requested by pt/family on 09/25/14     FL2 transmitted to all facilities within larger geographic area on       Patient informed that his/her managed care company has contracts with or will negotiate with certain facilities, including the following:        Yes   Patient/family informed of bed offers received.  Patient chooses bed at [facility]     Physician recommends and patient chooses bed at      Patient to be transferred to [facility] on 09/25/14.  Patient to be transferred to facility by [facility]     Patient family notified on 09/25/14 of transfer.  Name of family member notified:  Patient updated at bedside.     PHYSICIAN       Additional Comment:     _______________________________________________ Caroline Sauger, LCSW 09/25/2014, 1:10 PM [phone]

## 2014-09-25 NOTE — Progress Notes (Signed)
Report called to [facility]. RN felt comfortable with Pts plan for rehab and asked questions concerning Pts goals and plan of care. Pt assisted to the stretcher and taken to [place] at this time.

## 2014-09-25 NOTE — Care Management Note (Signed)
Case Management Note  Patient Details  Name: Kristen [name] MRN: [id number] Date of Birth: 1940-11-27  Subjective/Objective:  74 yr old female admitted with left knee osteoarthritis. Patient underwent a left total knee arthroplasty.                  Action/Plan:       Expected Discharge Date:                  Expected Discharge Plan:  Skilled Nursing Facility  In-House Referral:  Clinical Social Work  Discharge planning Services  CM Consult  Post Acute Care Choice:  NA Choice offered to:  Patient  DME Arranged:  N/A DME Agency:     HH Arranged:  NA HH Agency:  [facility]  Status of Service:  Completed, signed off  Medicare Important Message Given:    Date Medicare IM Given:    Medicare IM give by:    Date Additional Medicare IM Given:    Additional Medicare Important Message give by:     If discussed at [place] of Stay Meetings, dates discussed:    Additional Comments:  Ninfa Meeker, RN 09/25/2014, 12:04 PM

## 2014-09-28 ENCOUNTER — Non-Acute Institutional Stay (SKILLED_NURSING_FACILITY): Payer: Medicare Other | Admitting: Adult Health

## 2014-09-28 ENCOUNTER — Encounter: Payer: Self-pay | Admitting: Adult Health

## 2014-09-28 DIAGNOSIS — K5900 Constipation, unspecified: Secondary | ICD-10-CM | POA: Diagnosis not present

## 2014-09-28 DIAGNOSIS — I10 Essential (primary) hypertension: Secondary | ICD-10-CM

## 2014-09-28 DIAGNOSIS — E785 Hyperlipidemia, unspecified: Secondary | ICD-10-CM

## 2014-09-28 DIAGNOSIS — IMO0002 Reserved for concepts with insufficient information to code with codable children: Secondary | ICD-10-CM

## 2014-09-28 DIAGNOSIS — M1712 Unilateral primary osteoarthritis, left knee: Secondary | ICD-10-CM | POA: Diagnosis not present

## 2014-09-28 DIAGNOSIS — E1165 Type 2 diabetes mellitus with hyperglycemia: Secondary | ICD-10-CM | POA: Diagnosis not present

## 2014-09-28 DIAGNOSIS — D62 Acute posthemorrhagic anemia: Secondary | ICD-10-CM

## 2014-09-28 NOTE — Progress Notes (Addendum)
Patient ID: Kristen [name], female   DOB: 11-05-1940, 74 y.o.   MRN: [id number]   09/28/2014  Facility:  Nursing Home Location:  [place] Room Number: 1204-P LEVEL OF CARE:  SNF (31)   Chief Complaint  Patient presents with  . Hospitalization Follow-up    Osteoarthritis S/P left total knee arthroplasty, hypertension, diabetes mellitus, constipation, hyperlipidemia and anemia    HISTORY OF PRESENT ILLNESS:  This is a 74 year old female was being admitted to [facility] on 09/25/14 from [facility] with osteoarthritis S/P left total knee arthroplasty. She has PMH of diabetes mellitus type 2, CAD, hyperlipidemia and sleep apnea. She has been admitted for a short-term rehabilitation.  PAST MEDICAL HISTORY:  Past Medical History  Diagnosis Date  . HTN (hypertension)   . Diabetes type 2, controlled   . HLD (hyperlipidemia)   . Morbid obesity   . Vertigo     hx. benign postitional postural  . Internal capsule hemorrhage     hx of sublacunar infarct involving the right posterior limb of the internal capsule   . Osteoarthritis     knees  . Frequent headaches   . History of pulmonary embolism 2012  . CAD (coronary artery disease)     by CT, per pt h/o MI  . Myocardial infarction 2012    per pt. report, states she was treated with medicine  , here at [facility]    . Stroke     still has balance problem on occas. , uses cane but that's mainly for the left knee pain  . Sleep apnea 2011    study done in [place], states that since she lost weight she doesn't use the CPAP any longer & she doesn't have a problem with sleep apnea   . Shortness of breath dyspnea     yes-relative to pain   . Primary localized osteoarthritis of left knee 09/22/2014    CURRENT MEDICATIONS: Reviewed per MAR/see medication list  Allergies  Allergen Reactions  . Aleve [Naproxen Sodium] Other (See Comments)    Spits up blood  . Metformin And Related Other (See Comments)    Chills, dizziness  . Penicillins Rash    Whelps and swelling     REVIEW OF SYSTEMS:  GENERAL: no change in appetite, no fatigue, no weight changes, no fever, chills or weakness RESPIRATORY: no cough, SOB, DOE, wheezing, hemoptysis CARDIAC: no chest pain, edema or palpitations GI: no abdominal pain, diarrhea, constipation, heart burn, nausea or vomiting  PHYSICAL EXAMINATION  GENERAL: no acute distress, obese EYES: conjunctivae normal, sclerae normal, normal eye lids NECK: supple, trachea midline, no neck masses, no thyroid tenderness, no thyromegaly LYMPHATICS: no LAN in the neck, no supraclavicular LAN RESPIRATORY: breathing is even & unlabored, BS CTAB CARDIAC: RRR, no murmur,no extra heart sounds, LLE edema 1+ GI: abdomen soft, normal BS, no masses, no tenderness, no hepatomegaly, no splenomegaly EXTREMITIES: Able to move 4 extremities PSYCHIATRIC: the patient is alert & oriented to person, affect & behavior appropriate  LABS/RADIOLOGY: Labs reviewed: Basic Metabolic Panel:  Recent Labs  12/08/13 1031 03/10/14 1233 09/10/14 1207 09/23/14 0655  NA 140 136 142 135  K 3.7 4.1 3.3* 3.7  CL 104 103 103 96*  CO2 24 28 30 26   GLUCOSE 129* 141* 103* 219*  BUN 14 20* 17 11  CREATININE 1.1 1.34* 1.25* 1.00  CALCIUM 9.1 9.5 9.3 8.6*  PHOS 3.0  --   --   --  Liver Function Tests:  Recent Labs  12/08/13 1031 03/10/14 1233  AST  --  17  ALT  --  23  ALKPHOS  --  122*  PROT  --  8.5*  ALBUMIN 3.5 3.6   CBC:  Recent Labs  12/08/13 1031  09/23/14 0655 09/24/14 0521 09/25/14 0635  WBC 6.4  < > 8.9 11.2* 9.2  NEUTROABS 3.5  --   --   --   --   HGB 11.4*  < > 11.2* 10.6* 10.3*  HCT 35.4*  < > 35.8* 33.1* 32.7*  MCV 87.1  < > 84.8 83.8 84.7  PLT 248.0  < > 207 214 228  < > = values in this interval not displayed.  Lipid Panel:  Recent Labs  12/08/13 1031  HDL 42.40   CBG:  Recent Labs  09/24/14 2139 09/25/14 0642 09/25/14 1212  GLUCAP 187* 150*  180*    Dg Knee Left Port  09/22/2014   CLINICAL DATA:  Status post total knee arthroplasty  EXAM: PORTABLE LEFT KNEE - 1-2 VIEW  COMPARISON:  January 27, 2014  FINDINGS: Frontal and lateral views were obtained. Patient is status post total knee replacement with femoral and tibial components appearing well-seated. No acute fracture or dislocation. Air within the joint is an expected postoperative finding.  IMPRESSION: Prosthetic components appear well seated. No fracture or dislocation.   Electronically Signed   By: Lowella Grip III M.D.   On: 09/22/2014 13:47    ASSESSMENT/PLAN:  Osteoarthritis S/P left total knee arthroplasty - for rehabilitation; continue Xarelto 10 mg 1 tab by mouth daily for DVT for prophylaxis; baclofen 10 mg 1 tab by mouth 3 times a day when necessary for muscle spasm; and Percocet 10/325 mg 1-2 tabs by mouth every 6 hours when necessary for pain Hypertension - continue Norvasc 5 mg by mouth daily Diabetes mellitus, type II - hemoglobin A1c 8.3; continue Amaryl 2 mg 1 tab by mouth daily Constipation - continue Senokot S2 tabs by mouth daily Hyperlipidemia - continue Zocor 20 mg 1 tab by mouth daily at bedtime Anemia, acute blood loss - hemoglobin 10.3; will monitor   Goals of care:  Short-term rehabilitation   Labs/test ordered:  CBC and CMP  Spent 50 minutes in patient care.    [facility], NP [employer] [phone]

## 2014-09-29 ENCOUNTER — Encounter: Payer: Self-pay | Admitting: Internal Medicine

## 2014-09-29 ENCOUNTER — Non-Acute Institutional Stay (SKILLED_NURSING_FACILITY): Payer: Medicare Other | Admitting: Internal Medicine

## 2014-09-29 DIAGNOSIS — E1165 Type 2 diabetes mellitus with hyperglycemia: Secondary | ICD-10-CM

## 2014-09-29 DIAGNOSIS — E785 Hyperlipidemia, unspecified: Secondary | ICD-10-CM

## 2014-09-29 DIAGNOSIS — I10 Essential (primary) hypertension: Secondary | ICD-10-CM

## 2014-09-29 DIAGNOSIS — M1712 Unilateral primary osteoarthritis, left knee: Secondary | ICD-10-CM | POA: Diagnosis not present

## 2014-09-29 DIAGNOSIS — D62 Acute posthemorrhagic anemia: Secondary | ICD-10-CM | POA: Diagnosis not present

## 2014-09-29 DIAGNOSIS — K5900 Constipation, unspecified: Secondary | ICD-10-CM

## 2014-09-29 DIAGNOSIS — IMO0002 Reserved for concepts with insufficient information to code with codable children: Secondary | ICD-10-CM

## 2014-09-29 NOTE — Progress Notes (Signed)
Patient ID: Kristen [name], female   DOB: 02/14/1941, 74 y.o.   MRN: [id number]     [place]  PCP: [name], MD  Code Status: Full Code   Allergies  Allergen Reactions  . Aleve [Naproxen Sodium] Other (See Comments)    Spits up blood  . Metformin And Related Other (See Comments)    Chills, dizziness  . Penicillins Rash    Whelps and swelling    Chief Complaint  Patient presents with  . New Admit To SNF    New Admission      HPI:  74 year old patient is here for short term rehabilitation post hospital admission from 09/22/14-09/25/14 with left knee OA. She underwent left total knee arthroplasty. She is seen in her room today. Her pain is under control with current regimen, she denies any concerns this visit. She has PMH of CAD, DM type 2, HLD, sleep apnea.  Review of Systems:  Constitutional: Negative for fever, chills, diaphoresis.  HENT: Negative for headache, congestion, nasal discharge Eyes: Negative for eye pain, blurred vision, double vision and discharge.  Respiratory: Negative for cough, shortness of breath and wheezing.   Cardiovascular: Negative for chest pain, palpitations, leg swelling.  Gastrointestinal: Negative for heartburn, nausea, vomiting, abdominal pain. Had bowel movement this am Genitourinary: Negative for dysuria Musculoskeletal: Negative for back pain Skin: Negative for itching, sores and rash.  Neurological: Negative for dizziness, tingling Psychiatric/Behavioral: Negative for depression   Past Medical History  Diagnosis Date  . HTN (hypertension)   . Diabetes type 2, controlled   . HLD (hyperlipidemia)   . Morbid obesity   . Vertigo     hx. benign postitional postural  . Internal capsule hemorrhage     hx of sublacunar infarct involving the right posterior limb of the internal capsule   . Osteoarthritis     knees  . Frequent headaches   . History of pulmonary embolism 2012  . CAD (coronary artery disease)    by CT, per pt h/o MI  . Myocardial infarction 2012    per pt. report, states she was treated with medicine  , here at [facility]    . Stroke     still has balance problem on occas. , uses cane but that's mainly for the left knee pain  . Sleep apnea 2011    study done in [place], states that since she lost weight she doesn't use the CPAP any longer & she doesn't have a problem with sleep apnea   . Shortness of breath dyspnea     yes-relative to pain   . Primary localized osteoarthritis of left knee 09/22/2014   Past Surgical History  Procedure Laterality Date  . Total knee arthroplasty Right 1990s  . Partial hysterectomy      for fibroids, ovaries remain  . Foot surgery Right   . Cataract extraction Bilateral 2013  . Eye surgery      /w IOL  . Tonsillectomy    . Tubal ligation    . Total knee arthroplasty Left 09/22/2014    [name], MD   Social History:   reports that she has never smoked. She has never used smokeless tobacco. She reports that she drinks alcohol. She reports that she does not use illicit drugs.  Family History  Problem Relation Age of Onset  . Cancer Mother     bone  . Diabetes Father   . Cancer Son 31    lung  .  Congenital heart disease Son 76  . Hypertension Father     Medications: Patient's Medications  New Prescriptions   No medications on file  Previous Medications   AMLODIPINE (NORVASC) 5 MG TABLET    TAKE 1 TABLET (5 MG TOTAL) BY MOUTH DAILY.   BACLOFEN (LIORESAL) 10 MG TABLET    Take 1 tablet (10 mg total) by mouth 3 (three) times daily. As needed for muscle spasm   BLOOD GLUCOSE MONITORING SUPPL (ONE TOUCH ULTRA SYSTEM KIT) W/DEVICE KIT    1 kit by Does not apply route once.   GLIMEPIRIDE (AMARYL) 2 MG TABLET    Take 1 tablet (2 mg total) by mouth daily with breakfast.   GLUCOSE BLOOD TEST STRIP    250.02 Use as instructed   LOSARTAN (COZAAR) 100 MG TABLET    Take 1 tablet (100 mg total) by mouth daily.   MULTIPLE VITAMINS-IRON  (MULTIVITAMIN/IRON PO)    Take 1 tablet by mouth daily.   NITROGLYCERIN (NITROSTAT) 0.4 MG SL TABLET    Place 0.4 mg under the tongue every 5 (five) minutes as needed for chest pain.   ONDANSETRON (ZOFRAN) 4 MG TABLET    Take 1 tablet (4 mg total) by mouth every 8 (eight) hours as needed for nausea or vomiting.   OXYCODONE-ACETAMINOPHEN (PERCOCET) 10-325 MG PER TABLET    Take 1-2 tablets by mouth every 6 (six) hours as needed for pain. MAXIMUM TOTAL ACETAMINOPHEN DOSE IS 4000 MG PER DAY   RIVAROXABAN (XARELTO) 10 MG TABS TABLET    Take 1 tablet (10 mg total) by mouth daily.   SENNOSIDES-DOCUSATE SODIUM (SENOKOT-S) 8.6-50 MG TABLET    Take 2 tablets by mouth daily.   SIMVASTATIN (ZOCOR) 20 MG TABLET    TAKE 1 TABLET (20 MG TOTAL) BY MOUTH AT BEDTIME.  Modified Medications   No medications on file  Discontinued Medications   No medications on file     Physical Exam: Filed Vitals:   09/29/14 0942  BP: 136/80  Pulse: 74  Temp: 97.6 F (36.4 C)  TempSrc: Oral  Resp: 18  Height: _0  (1.575 m)  Weight: 218 lb (98.884 kg)  SpO2: 97%    General- elderly female, obese, in no acute distress Head- normocephalic, atraumatic Throat- moist mucus membrane Eyes- PERRLA, EOMI, no pallor, no icterus, no discharge, normal conjunctiva, normal sclera Neck- no cervical lymphadenopathy Cardiovascular- normal s1,s2, no murmurs, palpable dorsalis pedis and radial pulses, trace right leg edema Respiratory- bilateral clear to auscultation, no wheeze, no rhonchi, no crackles, no use of accessory muscles Abdomen- bowel sounds present, soft, non tender Musculoskeletal- able to move all 4 extremities, left leg ROM limited Neurological- no focal deficit Skin- warm and dry, left knee has aquacel dressing, clean and dry Psychiatry- alert and oriented to person, place and time, normal mood and affect    Labs reviewed: Basic Metabolic Panel:  Recent Labs  12/08/13 1031 03/10/14 1233 09/10/14 1207  09/23/14 0655  NA 140 136 142 135  K 3.7 4.1 3.3* 3.7  CL 104 103 103 96*  CO2 _1 GLUCOSE 129* 141* 103* 219*  BUN 14 20* 17 11  CREATININE 1.1 1.34* 1.25* 1.00  CALCIUM 9.1 9.5 9.3 8.6*  PHOS 3.0  --   --   --    Liver Function Tests:  Recent Labs  12/08/13 1031 03/10/14 1233  AST  --  17  ALT  --  23  ALKPHOS  --  122*  PROT  --  8.5*  ALBUMIN 3.5 3.6   No results for input(s): LIPASE, AMYLASE in the last 8760 hours. No results for input(s): AMMONIA in the last 8760 hours. CBC:  Recent Labs  12/08/13 1031  09/23/14 0655 09/24/14 0521 09/25/14 0635  WBC 6.4  < > 8.9 11.2* 9.2  NEUTROABS 3.5  --   --   --   --   HGB 11.4*  < > 11.2* 10.6* 10.3*  HCT 35.4*  < > 35.8* 33.1* 32.7*  MCV 87.1  < > 84.8 83.8 84.7  PLT 248.0  < > 207 214 228  < > = values in this interval not displayed. Cardiac Enzymes: No results for input(s): CKTOTAL, CKMB, CKMBINDEX, TROPONINI in the last 8760 hours. BNP: Invalid input(s): POCBNP CBG:  Recent Labs  09/24/14 2139 09/25/14 0642 09/25/14 1212  GLUCAP 187* 150* 180*     Assessment/Plan  Left knee Osteoarthritis  S/P left total knee arthroplasty. Will have patient work with PT/OT as tolerated to regain strength and restore function.  Fall precautions are in place. Continue Xarelto 10 mg daily for DVT prophylaxis. Has f/u with orthopedics. Continue percocet 10/325 mg 1-2 tab q6h prn pain and baclofen 10 mg tid prn muscle spasm  Constipation With her on narcotics, continue senokot s 2 tab daily for now, hydration encouraged  Blood loss anemia Monitor h7h, last hb 10.3  Hypertension Stable. continue Norvasc 5 mg daily  Diabetes mellitus, type II A1c 8.3. Monitor cbg and continue Amaryl 2 mg daily  Hyperlipidemia continue Zocor 20 mg daily at bedtime   Goals of care: short term rehabilitation   Labs/tests ordered: cbc, cmp  Family/ staff Communication: reviewed care plan with patient and nursing  supervisor    Blanchie Serve, MD  [facility] [phone] (Monday-Friday 8 am - 5 pm) [phone] (afterhours)

## 2014-10-05 DIAGNOSIS — Z96652 Presence of left artificial knee joint: Secondary | ICD-10-CM | POA: Diagnosis not present

## 2014-10-09 ENCOUNTER — Non-Acute Institutional Stay (SKILLED_NURSING_FACILITY): Payer: Medicare Other | Admitting: Adult Health

## 2014-10-09 ENCOUNTER — Encounter: Payer: Self-pay | Admitting: Adult Health

## 2014-10-09 DIAGNOSIS — I10 Essential (primary) hypertension: Secondary | ICD-10-CM

## 2014-10-09 DIAGNOSIS — D62 Acute posthemorrhagic anemia: Secondary | ICD-10-CM | POA: Diagnosis not present

## 2014-10-09 DIAGNOSIS — E1165 Type 2 diabetes mellitus with hyperglycemia: Secondary | ICD-10-CM

## 2014-10-09 DIAGNOSIS — E785 Hyperlipidemia, unspecified: Secondary | ICD-10-CM

## 2014-10-09 DIAGNOSIS — IMO0002 Reserved for concepts with insufficient information to code with codable children: Secondary | ICD-10-CM

## 2014-10-09 DIAGNOSIS — K5900 Constipation, unspecified: Secondary | ICD-10-CM

## 2014-10-09 DIAGNOSIS — M1712 Unilateral primary osteoarthritis, left knee: Secondary | ICD-10-CM | POA: Diagnosis not present

## 2014-10-09 NOTE — Progress Notes (Signed)
Patient ID: Kristen [name], female   DOB: May 07, 1940, 74 y.o.   MRN: [id number]   10/09/2014  Facility:  Nursing Home Location:  [place] Room Number: 1204-P LEVEL OF CARE:  SNF (31)   Chief Complaint  Patient presents with  . Discharge Note    Osteoarthritis S/P left total knee arthroplasty, hypertension, diabetes mellitus, constipation, hyperlipidemia and anemia    HISTORY OF PRESENT ILLNESS:  This is a 74 year old female who is being discharged home with Home health PT for ambulation, strengthening and ROM, OT for safety and Home management. She has been admitted to [facility] on 09/25/14 from [facility] with osteoarthritis S/P left total knee arthroplasty. She has PMH of diabetes mellitus type 2, CAD, hyperlipidemia and sleep apnea.   Patient was admitted to this facility for short-term rehabilitation after the patient's recent hospitalization.  Patient has completed SNF rehabilitation and therapy has cleared the patient for discharge.   PAST MEDICAL HISTORY:  Past Medical History  Diagnosis Date  . HTN (hypertension)   . Diabetes type 2, controlled   . HLD (hyperlipidemia)   . Morbid obesity   . Vertigo     hx. benign postitional postural  . Internal capsule hemorrhage     hx of sublacunar infarct involving the right posterior limb of the internal capsule   . Osteoarthritis     knees  . Frequent headaches   . History of pulmonary embolism 2012  . CAD (coronary artery disease)     by CT, per pt h/o MI  . Myocardial infarction 2012    per pt. report, states she was treated with medicine  , here at [facility]    . Stroke     still has balance problem on occas. , uses cane but that's mainly for the left knee pain  . Sleep apnea 2011    study done in [place], states that since she lost weight she doesn't use the CPAP any longer & she doesn't have a problem with sleep apnea   . Shortness of breath dyspnea     yes-relative to pain   .  Primary localized osteoarthritis of left knee 09/22/2014    CURRENT MEDICATIONS: Reviewed per MAR/see medication list  Allergies  Allergen Reactions  . Aleve [Naproxen Sodium] Other (See Comments)    Spits up blood  . Metformin And Related Other (See Comments)    Chills, dizziness  . Penicillins Rash    Whelps and swelling     REVIEW OF SYSTEMS:  GENERAL: no change in appetite, no fatigue, no weight changes, no fever, chills or weakness RESPIRATORY: no cough, SOB, DOE, wheezing, hemoptysis CARDIAC: no chest pain, edema or palpitations GI: no abdominal pain, diarrhea, constipation, heart burn, nausea or vomiting  PHYSICAL EXAMINATION  GENERAL: no acute distress, obese SKIN:  Left knee surgical wound is dry, no redness NECK: supple, trachea midline, no neck masses, no thyroid tenderness, no thyromegaly LYMPHATICS: no LAN in the neck, no supraclavicular LAN RESPIRATORY: breathing is even & unlabored, BS CTAB CARDIAC: RRR, no murmur,no extra heart sounds, LLE edema 1+ GI: abdomen soft, normal BS, no masses, no tenderness, no hepatomegaly, no splenomegaly EXTREMITIES: Able to move 4 extremities PSYCHIATRIC: the patient is alert & oriented to person, affect & behavior appropriate  LABS/RADIOLOGY: Labs reviewed: 09/29/14   WBC 7.7 hemoglobin 9.9 hematocrit 31.2 MCV 82.5 platelet 286 sodium 141 potassium 4.3 glucose 143 BUN 12 creatinine 0.97 total bilirubin 0.7 alkaline phosphatase 75  SGOT 18 SGPT 12 total protein 6.1 of human 3.3 calcium 8.7 Basic Metabolic Panel:  Recent Labs  12/08/13 1031 03/10/14 1233 09/10/14 1207 09/23/14 0655  NA 140 136 142 135  K 3.7 4.1 3.3* 3.7  CL 104 103 103 96*  CO2 24 28 30 26   GLUCOSE 129* 141* 103* 219*  BUN 14 20* 17 11  CREATININE 1.1 1.34* 1.25* 1.00  CALCIUM 9.1 9.5 9.3 8.6*  PHOS 3.0  --   --   --    Liver Function Tests:  Recent Labs  12/08/13 1031 03/10/14 1233  AST  --  17  ALT  --  23  ALKPHOS  --  122*  PROT  --   8.5*  ALBUMIN 3.5 3.6   CBC:  Recent Labs  12/08/13 1031  09/23/14 0655 09/24/14 0521 09/25/14 0635  WBC 6.4  < > 8.9 11.2* 9.2  NEUTROABS 3.5  --   --   --   --   HGB 11.4*  < > 11.2* 10.6* 10.3*  HCT 35.4*  < > 35.8* 33.1* 32.7*  MCV 87.1  < > 84.8 83.8 84.7  PLT 248.0  < > 207 214 228  < > = values in this interval not displayed.  Lipid Panel:  Recent Labs  12/08/13 1031  HDL 42.40   CBG:  Recent Labs  09/24/14 2139 09/25/14 0642 09/25/14 1212  GLUCAP 187* 150* 180*    Dg Knee Left Port  09/22/2014   CLINICAL DATA:  Status post total knee arthroplasty  EXAM: PORTABLE LEFT KNEE - 1-2 VIEW  COMPARISON:  January 27, 2014  FINDINGS: Frontal and lateral views were obtained. Patient is status post total knee replacement with femoral and tibial components appearing well-seated. No acute fracture or dislocation. Air within the joint is an expected postoperative finding.  IMPRESSION: Prosthetic components appear well seated. No fracture or dislocation.   Electronically Signed   By: Lowella Grip III M.D.   On: 09/22/2014 13:47    ASSESSMENT/PLAN:  Osteoarthritis S/P left total knee arthroplasty - for Home health PT and OT; continue Xarelto 10 mg 1 tab by mouth daily for DVT for prophylaxis; baclofen 10 mg 1 tab by mouth 3 times a day when necessary for muscle spasm; and Percocet 10/325 mg 1-2 tabs by mouth every 6 hours when necessary for pain; follow-up with Dr. Mardelle Matte, orthopedic surgeon, in 4 weeks Hypertension - continue Norvasc 5 mg by mouth daily Diabetes mellitus, type II - hemoglobin A1c 8.3; continue Amaryl 2 mg 1 tab by mouth daily Constipation - continue Senokot S2 tabs by mouth daily Hyperlipidemia - continue Zocor 20 mg 1 tab by mouth daily at bedtime Anemia, acute blood loss - hemoglobin 9.9; stable   I have filled out patient's discharge paperwork and written prescriptions.  Patient will receive home health PT and OT.  Total discharge time: Less than 30  minutes  Discharge time involved coordination of the discharge process with Education officer, museum, nursing staff and therapy department. Medical justification for home health services verified.     [facility], NP [employer] [phone]

## 2014-10-12 DIAGNOSIS — Z471 Aftercare following joint replacement surgery: Secondary | ICD-10-CM | POA: Diagnosis not present

## 2014-10-12 DIAGNOSIS — G4730 Sleep apnea, unspecified: Secondary | ICD-10-CM | POA: Diagnosis not present

## 2014-10-12 DIAGNOSIS — E1165 Type 2 diabetes mellitus with hyperglycemia: Secondary | ICD-10-CM | POA: Diagnosis not present

## 2014-10-12 DIAGNOSIS — E785 Hyperlipidemia, unspecified: Secondary | ICD-10-CM | POA: Diagnosis not present

## 2014-10-12 DIAGNOSIS — Z96652 Presence of left artificial knee joint: Secondary | ICD-10-CM | POA: Diagnosis not present

## 2014-10-12 DIAGNOSIS — Z7901 Long term (current) use of anticoagulants: Secondary | ICD-10-CM | POA: Diagnosis not present

## 2014-10-12 DIAGNOSIS — D500 Iron deficiency anemia secondary to blood loss (chronic): Secondary | ICD-10-CM | POA: Diagnosis not present

## 2014-10-12 DIAGNOSIS — I10 Essential (primary) hypertension: Secondary | ICD-10-CM | POA: Diagnosis not present

## 2014-10-13 DIAGNOSIS — G4730 Sleep apnea, unspecified: Secondary | ICD-10-CM | POA: Diagnosis not present

## 2014-10-13 DIAGNOSIS — Z471 Aftercare following joint replacement surgery: Secondary | ICD-10-CM | POA: Diagnosis not present

## 2014-10-13 DIAGNOSIS — Z7901 Long term (current) use of anticoagulants: Secondary | ICD-10-CM | POA: Diagnosis not present

## 2014-10-13 DIAGNOSIS — E1165 Type 2 diabetes mellitus with hyperglycemia: Secondary | ICD-10-CM | POA: Diagnosis not present

## 2014-10-13 DIAGNOSIS — I10 Essential (primary) hypertension: Secondary | ICD-10-CM | POA: Diagnosis not present

## 2014-10-13 DIAGNOSIS — Z96652 Presence of left artificial knee joint: Secondary | ICD-10-CM | POA: Diagnosis not present

## 2014-10-13 DIAGNOSIS — D500 Iron deficiency anemia secondary to blood loss (chronic): Secondary | ICD-10-CM | POA: Diagnosis not present

## 2014-10-13 DIAGNOSIS — E785 Hyperlipidemia, unspecified: Secondary | ICD-10-CM | POA: Diagnosis not present

## 2014-10-16 DIAGNOSIS — E785 Hyperlipidemia, unspecified: Secondary | ICD-10-CM | POA: Diagnosis not present

## 2014-10-16 DIAGNOSIS — D500 Iron deficiency anemia secondary to blood loss (chronic): Secondary | ICD-10-CM | POA: Diagnosis not present

## 2014-10-16 DIAGNOSIS — Z7901 Long term (current) use of anticoagulants: Secondary | ICD-10-CM | POA: Diagnosis not present

## 2014-10-16 DIAGNOSIS — I10 Essential (primary) hypertension: Secondary | ICD-10-CM | POA: Diagnosis not present

## 2014-10-16 DIAGNOSIS — Z96652 Presence of left artificial knee joint: Secondary | ICD-10-CM | POA: Diagnosis not present

## 2014-10-16 DIAGNOSIS — Z471 Aftercare following joint replacement surgery: Secondary | ICD-10-CM | POA: Diagnosis not present

## 2014-10-16 DIAGNOSIS — E1165 Type 2 diabetes mellitus with hyperglycemia: Secondary | ICD-10-CM | POA: Diagnosis not present

## 2014-10-16 DIAGNOSIS — G4730 Sleep apnea, unspecified: Secondary | ICD-10-CM | POA: Diagnosis not present

## 2014-10-19 DIAGNOSIS — Z96652 Presence of left artificial knee joint: Secondary | ICD-10-CM | POA: Diagnosis not present

## 2014-10-19 DIAGNOSIS — D500 Iron deficiency anemia secondary to blood loss (chronic): Secondary | ICD-10-CM | POA: Diagnosis not present

## 2014-10-19 DIAGNOSIS — E785 Hyperlipidemia, unspecified: Secondary | ICD-10-CM | POA: Diagnosis not present

## 2014-10-19 DIAGNOSIS — Z7901 Long term (current) use of anticoagulants: Secondary | ICD-10-CM | POA: Diagnosis not present

## 2014-10-19 DIAGNOSIS — I10 Essential (primary) hypertension: Secondary | ICD-10-CM | POA: Diagnosis not present

## 2014-10-19 DIAGNOSIS — Z471 Aftercare following joint replacement surgery: Secondary | ICD-10-CM | POA: Diagnosis not present

## 2014-10-19 DIAGNOSIS — E1165 Type 2 diabetes mellitus with hyperglycemia: Secondary | ICD-10-CM | POA: Diagnosis not present

## 2014-10-19 DIAGNOSIS — G4730 Sleep apnea, unspecified: Secondary | ICD-10-CM | POA: Diagnosis not present

## 2014-10-21 DIAGNOSIS — Z471 Aftercare following joint replacement surgery: Secondary | ICD-10-CM | POA: Diagnosis not present

## 2014-10-21 DIAGNOSIS — E785 Hyperlipidemia, unspecified: Secondary | ICD-10-CM | POA: Diagnosis not present

## 2014-10-21 DIAGNOSIS — D500 Iron deficiency anemia secondary to blood loss (chronic): Secondary | ICD-10-CM | POA: Diagnosis not present

## 2014-10-21 DIAGNOSIS — I10 Essential (primary) hypertension: Secondary | ICD-10-CM | POA: Diagnosis not present

## 2014-10-21 DIAGNOSIS — Z7901 Long term (current) use of anticoagulants: Secondary | ICD-10-CM | POA: Diagnosis not present

## 2014-10-21 DIAGNOSIS — G4730 Sleep apnea, unspecified: Secondary | ICD-10-CM | POA: Diagnosis not present

## 2014-10-21 DIAGNOSIS — Z96652 Presence of left artificial knee joint: Secondary | ICD-10-CM | POA: Diagnosis not present

## 2014-10-21 DIAGNOSIS — E1165 Type 2 diabetes mellitus with hyperglycemia: Secondary | ICD-10-CM | POA: Diagnosis not present

## 2014-10-23 DIAGNOSIS — Z471 Aftercare following joint replacement surgery: Secondary | ICD-10-CM | POA: Diagnosis not present

## 2014-10-23 DIAGNOSIS — G4730 Sleep apnea, unspecified: Secondary | ICD-10-CM | POA: Diagnosis not present

## 2014-10-23 DIAGNOSIS — Z96652 Presence of left artificial knee joint: Secondary | ICD-10-CM | POA: Diagnosis not present

## 2014-10-23 DIAGNOSIS — D500 Iron deficiency anemia secondary to blood loss (chronic): Secondary | ICD-10-CM | POA: Diagnosis not present

## 2014-10-23 DIAGNOSIS — Z7901 Long term (current) use of anticoagulants: Secondary | ICD-10-CM | POA: Diagnosis not present

## 2014-10-23 DIAGNOSIS — E1165 Type 2 diabetes mellitus with hyperglycemia: Secondary | ICD-10-CM | POA: Diagnosis not present

## 2014-10-23 DIAGNOSIS — E785 Hyperlipidemia, unspecified: Secondary | ICD-10-CM | POA: Diagnosis not present

## 2014-10-23 DIAGNOSIS — I10 Essential (primary) hypertension: Secondary | ICD-10-CM | POA: Diagnosis not present

## 2014-10-28 DIAGNOSIS — E785 Hyperlipidemia, unspecified: Secondary | ICD-10-CM | POA: Diagnosis not present

## 2014-10-28 DIAGNOSIS — Z96652 Presence of left artificial knee joint: Secondary | ICD-10-CM | POA: Diagnosis not present

## 2014-10-28 DIAGNOSIS — I10 Essential (primary) hypertension: Secondary | ICD-10-CM | POA: Diagnosis not present

## 2014-10-28 DIAGNOSIS — Z7901 Long term (current) use of anticoagulants: Secondary | ICD-10-CM | POA: Diagnosis not present

## 2014-10-28 DIAGNOSIS — G4730 Sleep apnea, unspecified: Secondary | ICD-10-CM | POA: Diagnosis not present

## 2014-10-28 DIAGNOSIS — E1165 Type 2 diabetes mellitus with hyperglycemia: Secondary | ICD-10-CM | POA: Diagnosis not present

## 2014-10-28 DIAGNOSIS — Z471 Aftercare following joint replacement surgery: Secondary | ICD-10-CM | POA: Diagnosis not present

## 2014-10-28 DIAGNOSIS — D500 Iron deficiency anemia secondary to blood loss (chronic): Secondary | ICD-10-CM | POA: Diagnosis not present

## 2014-10-29 ENCOUNTER — Encounter: Payer: Self-pay | Admitting: *Deleted

## 2014-10-30 DIAGNOSIS — E785 Hyperlipidemia, unspecified: Secondary | ICD-10-CM | POA: Diagnosis not present

## 2014-10-30 DIAGNOSIS — Z7901 Long term (current) use of anticoagulants: Secondary | ICD-10-CM | POA: Diagnosis not present

## 2014-10-30 DIAGNOSIS — I10 Essential (primary) hypertension: Secondary | ICD-10-CM | POA: Diagnosis not present

## 2014-10-30 DIAGNOSIS — Z96652 Presence of left artificial knee joint: Secondary | ICD-10-CM | POA: Diagnosis not present

## 2014-10-30 DIAGNOSIS — D500 Iron deficiency anemia secondary to blood loss (chronic): Secondary | ICD-10-CM | POA: Diagnosis not present

## 2014-10-30 DIAGNOSIS — Z471 Aftercare following joint replacement surgery: Secondary | ICD-10-CM | POA: Diagnosis not present

## 2014-10-30 DIAGNOSIS — E1165 Type 2 diabetes mellitus with hyperglycemia: Secondary | ICD-10-CM | POA: Diagnosis not present

## 2014-10-30 DIAGNOSIS — G4730 Sleep apnea, unspecified: Secondary | ICD-10-CM | POA: Diagnosis not present

## 2014-11-02 DIAGNOSIS — Z96652 Presence of left artificial knee joint: Secondary | ICD-10-CM | POA: Diagnosis not present

## 2014-11-03 DIAGNOSIS — Z471 Aftercare following joint replacement surgery: Secondary | ICD-10-CM | POA: Diagnosis not present

## 2014-11-03 DIAGNOSIS — G4730 Sleep apnea, unspecified: Secondary | ICD-10-CM | POA: Diagnosis not present

## 2014-11-03 DIAGNOSIS — E785 Hyperlipidemia, unspecified: Secondary | ICD-10-CM | POA: Diagnosis not present

## 2014-11-03 DIAGNOSIS — Z96652 Presence of left artificial knee joint: Secondary | ICD-10-CM | POA: Diagnosis not present

## 2014-11-03 DIAGNOSIS — E1165 Type 2 diabetes mellitus with hyperglycemia: Secondary | ICD-10-CM | POA: Diagnosis not present

## 2014-11-03 DIAGNOSIS — Z7901 Long term (current) use of anticoagulants: Secondary | ICD-10-CM | POA: Diagnosis not present

## 2014-11-03 DIAGNOSIS — I10 Essential (primary) hypertension: Secondary | ICD-10-CM | POA: Diagnosis not present

## 2014-11-03 DIAGNOSIS — D500 Iron deficiency anemia secondary to blood loss (chronic): Secondary | ICD-10-CM | POA: Diagnosis not present

## 2014-11-05 DIAGNOSIS — E1165 Type 2 diabetes mellitus with hyperglycemia: Secondary | ICD-10-CM | POA: Diagnosis not present

## 2014-11-05 DIAGNOSIS — I10 Essential (primary) hypertension: Secondary | ICD-10-CM | POA: Diagnosis not present

## 2014-11-05 DIAGNOSIS — E785 Hyperlipidemia, unspecified: Secondary | ICD-10-CM | POA: Diagnosis not present

## 2014-11-05 DIAGNOSIS — Z7901 Long term (current) use of anticoagulants: Secondary | ICD-10-CM | POA: Diagnosis not present

## 2014-11-05 DIAGNOSIS — Z96652 Presence of left artificial knee joint: Secondary | ICD-10-CM | POA: Diagnosis not present

## 2014-11-05 DIAGNOSIS — Z471 Aftercare following joint replacement surgery: Secondary | ICD-10-CM | POA: Diagnosis not present

## 2014-11-05 DIAGNOSIS — G4730 Sleep apnea, unspecified: Secondary | ICD-10-CM | POA: Diagnosis not present

## 2014-11-05 DIAGNOSIS — D500 Iron deficiency anemia secondary to blood loss (chronic): Secondary | ICD-10-CM | POA: Diagnosis not present

## 2014-11-16 ENCOUNTER — Telehealth: Payer: Self-pay

## 2014-11-16 NOTE — Telephone Encounter (Signed)
Unable to leave a message for patient about scheduling a Mammogram.

## 2014-12-04 ENCOUNTER — Encounter: Payer: Self-pay | Admitting: Family Medicine

## 2014-12-04 ENCOUNTER — Ambulatory Visit (INDEPENDENT_AMBULATORY_CARE_PROVIDER_SITE_OTHER): Payer: Medicare Other | Admitting: Family Medicine

## 2014-12-04 VITALS — BP 138/64 | HR 72 | Temp 98.6°F | Wt 208.8 lb

## 2014-12-04 DIAGNOSIS — M1712 Unilateral primary osteoarthritis, left knee: Secondary | ICD-10-CM

## 2014-12-04 DIAGNOSIS — R413 Other amnesia: Secondary | ICD-10-CM

## 2014-12-04 MED ORDER — OXYCODONE-ACETAMINOPHEN 5-325 MG PO TABS
1.0000 | ORAL_TABLET | Freq: Every day | ORAL | Status: DC | PRN
Start: 2014-12-04 — End: 2015-03-10

## 2014-12-04 NOTE — Patient Instructions (Addendum)
I've refilled oxycodone for you today. I want you to start with tylenol first. Use narcotic pain medicine sparingly.  Start B12 or B complex vitamin.  Return in 2-3 months for medicare wellness visit.

## 2014-12-04 NOTE — Assessment & Plan Note (Signed)
Advised start b12 500 mcg daily. Reassess at wellness visit.

## 2014-12-04 NOTE — Progress Notes (Signed)
Pre visit review using our clinic review tool, if applicable. No additional management support is needed unless otherwise documented below in the visit note. 

## 2014-12-04 NOTE — Assessment & Plan Note (Signed)
S/p replacement, doing remarkably well. Continues home exercise program.  Requests refill of percocet. Was taking 10/325. Provided with #20 5/325 to take, discussed need to use sparingly as well as recommend start with tyelnol to control pain.

## 2014-12-04 NOTE — Progress Notes (Signed)
BP 138/64 mmHg  Pulse 72  Temp(Src) 98.6 F (37 C) (Oral)  Wt 208 lb 12 oz (94.688 kg)  SpO2 97%   CC: check knee  Subjective:    Patient ID: Kristen [name], female    DOB: 1941-04-07, 74 y.o.   MRN: [id number]  HPI: Kristen [name] is a 74 y.o. female presenting on 12/04/2014 for Check knee and Medication Reaction   S/p L TKR 09/22/2014 by Dr Mardelle Matte. Recovered well. She has completed physical therapy. Notices some pain in the mornings. Takes tylenol but not very effective. Requests pain med refill - received #75 oxycodone 10/371m from ortho 08/2014.  MVI caused nausea. Advised to stop.   Occasional memory lapses ie forgets if she's taken pills on a given day. Does use weekly pill box.  Relevant past medical, surgical, family and social history reviewed and updated as indicated. Interim medical history since our last visit reviewed. Allergies and medications reviewed and updated. Current Outpatient Prescriptions on File Prior to Visit  Medication Sig  . amLODipine (NORVASC) 5 MG tablet TAKE 1 TABLET (5 MG TOTAL) BY MOUTH DAILY.  . baclofen (LIORESAL) 10 MG tablet Take 1 tablet (10 mg total) by mouth 3 (three) times daily. As needed for muscle spasm  . Blood Glucose Monitoring Suppl (ONE TOUCH ULTRA SYSTEM KIT) W/DEVICE KIT 1 kit by Does not apply route once.  .Marland Kitchenglimepiride (AMARYL) 2 MG tablet Take 1 tablet (2 mg total) by mouth daily with breakfast.  . glucose blood test strip 250.02 Use as instructed  . nitroGLYCERIN (NITROSTAT) 0.4 MG SL tablet Place 0.4 mg under the tongue every 5 (five) minutes as needed for chest pain.  .Marland Kitchenondansetron (ZOFRAN) 4 MG tablet Take 1 tablet (4 mg total) by mouth every 8 (eight) hours as needed for nausea or vomiting.  . rivaroxaban (XARELTO) 10 MG TABS tablet Take 1 tablet (10 mg total) by mouth daily.  . sennosides-docusate sodium (SENOKOT-S) 8.6-50 MG tablet Take 2 tablets by mouth daily.  . simvastatin (ZOCOR) 20 MG tablet TAKE 1 TABLET (20 MG  TOTAL) BY MOUTH AT BEDTIME. (Patient taking differently: TAKE 1 TABLET (20 MG TOTAL) BY MOUTH with breakfast)  . losartan (COZAAR) 100 MG tablet Take 1 tablet (100 mg total) by mouth daily.   No current facility-administered medications on file prior to visit.    Review of Systems Per HPI unless specifically indicated above     Objective:    BP 138/64 mmHg  Pulse 72  Temp(Src) 98.6 F (37 C) (Oral)  Wt 208 lb 12 oz (94.688 kg)  SpO2 97%  Wt Readings from Last 3 Encounters:  12/04/14 208 lb 12 oz (94.688 kg)  10/09/14 218 lb 6.4 oz (99.066 kg)  09/29/14 218 lb (98.884 kg)    Physical Exam  Constitutional: She appears well-developed and well-nourished. No distress.  Cardiovascular: Normal rate, regular rhythm, normal heart sounds and intact distal pulses.   No murmur heard. Pulmonary/Chest: Effort normal and breath sounds normal. No respiratory distress. She has no wheezes. She has no rales.  Musculoskeletal: She exhibits no edema.  Scar L knee present, no erythema or induration  Nursing note and vitals reviewed.     Assessment & Plan:   Problem List Items Addressed This Visit    Primary osteoarthritis of left knee - Primary    S/p replacement, doing remarkably well. Continues home exercise program.  Requests refill of percocet. Was taking 10/325. Provided with #20 5/325 to take, discussed need  to use sparingly as well as recommend start with tyelnol to control pain.      Relevant Medications   oxyCODONE-acetaminophen (ROXICET) 5-325 MG per tablet   Memory deficit    Advised start b12 500 mcg daily. Reassess at wellness visit.          Follow up plan: Return in about 3 months (around 03/06/2015), or as needed, for medicare wellness.

## 2014-12-14 DIAGNOSIS — Z96652 Presence of left artificial knee joint: Secondary | ICD-10-CM | POA: Diagnosis not present

## 2014-12-18 ENCOUNTER — Encounter: Payer: Self-pay | Admitting: Family Medicine

## 2014-12-18 ENCOUNTER — Telehealth: Payer: Self-pay | Admitting: Family Medicine

## 2014-12-18 ENCOUNTER — Ambulatory Visit (INDEPENDENT_AMBULATORY_CARE_PROVIDER_SITE_OTHER): Payer: Medicare Other | Admitting: Family Medicine

## 2014-12-18 VITALS — BP 130/80 | HR 79 | Temp 98.5°F | Ht 62.0 in | Wt 208.2 lb

## 2014-12-18 DIAGNOSIS — M79644 Pain in right finger(s): Secondary | ICD-10-CM

## 2014-12-18 LAB — CBC WITH DIFFERENTIAL/PLATELET
BASOS ABS: 0 10*3/uL (ref 0.0–0.1)
Basophils Relative: 0.3 % (ref 0.0–3.0)
EOS ABS: 0 10*3/uL (ref 0.0–0.7)
Eosinophils Relative: 0.3 % (ref 0.0–5.0)
HCT: 37.5 % (ref 36.0–46.0)
Hemoglobin: 11.8 g/dL — ABNORMAL LOW (ref 12.0–15.0)
LYMPHS ABS: 2 10*3/uL (ref 0.7–4.0)
Lymphocytes Relative: 21.6 % (ref 12.0–46.0)
MCHC: 31.5 g/dL (ref 30.0–36.0)
MCV: 82 fl (ref 78.0–100.0)
MONO ABS: 0.5 10*3/uL (ref 0.1–1.0)
Monocytes Relative: 5 % (ref 3.0–12.0)
NEUTROS ABS: 6.6 10*3/uL (ref 1.4–7.7)
NEUTROS PCT: 72.8 % (ref 43.0–77.0)
Platelets: 273 10*3/uL (ref 150.0–400.0)
RBC: 4.57 Mil/uL (ref 3.87–5.11)
RDW: 17.3 % — ABNORMAL HIGH (ref 11.5–15.5)
WBC: 9 10*3/uL (ref 4.0–10.5)

## 2014-12-18 LAB — URIC ACID: URIC ACID, SERUM: 7.2 mg/dL — AB (ref 2.4–7.0)

## 2014-12-18 MED ORDER — COLCHICINE 0.6 MG PO TABS: ORAL_TABLET | ORAL | Status: DC

## 2014-12-18 MED ORDER — SULFAMETHOXAZOLE-TRIMETHOPRIM 800-160 MG PO TABS: 2.0000 | ORAL_TABLET | Freq: Two times a day (BID) | ORAL | Status: DC

## 2014-12-18 NOTE — Telephone Encounter (Signed)
Notify pt that her uric acid is somewhat elevated suggesting gout, but not as high as I would have expected. Will send in colchicine to treat for gout.  Given diabetic, I will as well send in rx for antibiotics to cover for infection.  I would recommend following up with myself or her PCP  early next week to assure improvement.   Hold simvastatin while on colchicine.   Mail info on gout (print low purine diet from epic)

## 2014-12-18 NOTE — Assessment & Plan Note (Signed)
No known injury or fall. Most consistent with gout. Description of intermittent pain and swelling in last year, no bite seen most consistent with this.  More severe pain on exam  than usually is consistent with  Osteoarthritis.  Will eval with labs..  CBC to eval for infection in diabetic, uric acid for gout eval.  pt has oxycodone at home. 5/325 mg dose does not help. She will try 2 tabs three times a day to treat pain.  NSAIDs contraindicated as she is on xarelto for DVT.

## 2014-12-18 NOTE — Progress Notes (Signed)
Pre visit review using our clinic review tool, if applicable. No additional management support is needed unless otherwise documented below in the visit note. 

## 2014-12-18 NOTE — Addendum Note (Signed)
Addended by: [name] on: 12/18/2014 03:21 PM   Modules accepted: Orders

## 2014-12-18 NOTE — Patient Instructions (Addendum)
Low-Purine Diet  Purines are compounds that affect the level of uric acid in your body. A low-purine diet is a diet that is low in purines. Eating a low-purine diet can prevent the level of uric acid in your body from getting too high and causing gout or kidney stones or both.  WHAT DO I NEED TO KNOW ABOUT THIS DIET?  · Choose low-purine foods. Examples of low-purine foods are listed in the next section.  · Drink plenty of fluids, especially water. Fluids can help remove uric acid from your body. Try to drink 8-16 cups (1.9-3.8 L) a day.  · Limit foods high in fat, especially saturated fat, as fat makes it harder for the body to get rid of uric acid. Foods high in saturated fat include pizza, cheese, ice cream, whole milk, fried foods, and gravies. Choose foods that are lower in fat and lean sources of protein. Use olive oil when cooking as it contains healthy fats that are not high in saturated fat.  · Limit alcohol. Alcohol interferes with the elimination of uric acid from your body. If you are having a gout attack, avoid all alcohol.  · Keep in mind that different people's bodies react differently to different foods. You will probably learn over time which foods do or do not affect you. If you discover that a food tends to cause your gout to flare up, avoid eating that food. You can more freely enjoy foods that do not cause problems. If you have any questions about a food item, talk to your dietitian or health care provider.  WHICH FOODS ARE LOW, MODERATE, AND HIGH IN PURINES?  The following is a list of foods that are low, moderate, and high in purines. You can eat any amount of the foods that are low in purines. You may be able to have small amounts of foods that are moderate in purines. Ask your health care provider how much of a food moderate in purines you can have. Avoid foods high in purines.  Grains  · Foods low in purines: Enriched white bread, pasta, rice, cake, cornbread, popcorn.  · Foods moderate in  purines: Whole-grain breads and cereals, wheat germ, bran, oatmeal. Uncooked oatmeal. Dry wheat bran or wheat germ.  · Foods high in purines: Pancakes, French toast, biscuits, muffins.  Vegetables  · Foods low in purines: All vegetables, except those that are moderate in purines.  · Foods moderate in purines: Asparagus, cauliflower, spinach, mushrooms, green peas.  Fruits  · All fruits are low in purines.  Meats and other Protein Foods  · Foods low in purines: Eggs, nuts, peanut butter.  · Foods moderate in purines: 80-90% lean beef, lamb, veal, pork, poultry, fish, eggs, peanut butter, nuts. Crab, lobster, oysters, and shrimp. Cooked dried beans, peas, and lentils.  · Foods high in purines: Anchovies, sardines, herring, mussels, tuna, codfish, scallops, trout, and haddock. Bacon. Organ meats (such as liver or kidney). Tripe. Game meat. Goose. Sweetbreads.  Dairy  · All dairy foods are low in purines. Low-fat and fat-free dairy products are best because they are low in saturated fat.  Beverages  · Drinks low in purines: Water, carbonated beverages, tea, coffee, cocoa.  · Drinks moderate in purines: Soft drinks and other drinks sweetened with high-fructose corn syrup. Juices. To find whether a food or drink is sweetened with high-fructose corn syrup, look at the ingredients list.  · Drinks high in purines: Alcoholic beverages (such as beer).  Condiments  · Foods   low in purines: Salt, herbs, olives, pickles, relishes, vinegar.  · Foods moderate in purines: Butter, margarine, oils, mayonnaise.  Fats and Oils  · Foods low in purines: All types, except gravies and sauces made with meat.  · Foods high in purines: Gravies and sauces made with meat.  Other Foods  · Foods low in purines: Sugars, sweets, gelatin. Cake. Soups made without meat.  · Foods moderate in purines: Meat-based or fish-based soups, broths, or bouillons. Foods and drinks sweetened with high-fructose corn syrup.  · Foods high in purines: High-fat desserts  (such as ice cream, cookies, cakes, pies, doughnuts, and chocolate).  Contact your dietitian for more information on foods that are not listed here.  Document Released: 08/05/2010 Document Revised: 04/15/2013 Document Reviewed: 03/17/2013  [employer]® Patient Information ©2015 [employer]. This information is not intended to replace advice given to you by your health care provider. Make sure you discuss any questions you have with your health care provider.

## 2014-12-18 NOTE — Telephone Encounter (Signed)
Ms. Moyd notified as instructed by telephone.  Follow up appointment scheduled with Dr. Diona Browner for 12/22/2014 at 8:45 am.  Low Purine Diet handout mailed to patient.

## 2014-12-18 NOTE — Progress Notes (Signed)
   Subjective:    Patient ID: Kristen [name], female    DOB: [birth year], 74 y.o.   MRN: [id number]  HPI  74 year old female pt of Dr. Judie Grieve HTN, DM, CAD, hx of DVT in leg, Osteoarthritis presents with new onset swelling in  right middle finger at PIP joint. Noted pain and swelling worse in last 3 days. Peeling skin at area.  Also smaller amount of swelling in 2nd and 4th digit.  Tried benadryl without relief.  She states 1 year ago, she had swelling in same area.. Thinks she was bitten in the area in past. Did not see insect.  No fall on no injury.  No fever, no flu like symptoms.  Lab Results  Component Value Date   HGBA1C 8.3* 09/10/2014   FBS improved now.  On zarelto for clot in leg.    Review of Systems  Constitutional: Negative for fever and fatigue.  HENT: Negative for ear pain.   Eyes: Negative for pain.  Respiratory: Negative for chest tightness and shortness of breath.   Cardiovascular: Negative for chest pain, palpitations and leg swelling.  Gastrointestinal: Negative for abdominal pain.  Genitourinary: Negative for dysuria.       Objective:   Physical Exam  Constitutional: Vital signs are normal. She appears well-developed and well-nourished. She is cooperative.  Non-toxic appearance. She does not appear ill. No distress.  HENT:  Head: Normocephalic.  Right Ear: Hearing, tympanic membrane, external ear and ear canal normal. Tympanic membrane is not erythematous, not retracted and not bulging.  Left Ear: Hearing, tympanic membrane, external ear and ear canal normal. Tympanic membrane is not erythematous, not retracted and not bulging.  Nose: No mucosal edema or rhinorrhea. Right sinus exhibits no maxillary sinus tenderness and no frontal sinus tenderness. Left sinus exhibits no maxillary sinus tenderness and no frontal sinus tenderness.  Mouth/Throat: Uvula is midline, oropharynx is clear and moist and mucous membranes are normal.  Eyes:  Conjunctivae, EOM and lids are normal. Pupils are equal, round, and reactive to light. Lids are everted and swept, no foreign bodies found.  Neck: Trachea normal and normal range of motion. Neck supple. Carotid bruit is not present. No thyroid mass and no thyromegaly present.  Cardiovascular: Normal rate, regular rhythm, S1 normal, S2 normal, normal heart sounds, intact distal pulses and normal pulses.  Exam reveals no gallop and no friction rub.   No murmur heard. Pulmonary/Chest: Effort normal and breath sounds normal. No tachypnea. No respiratory distress. She has no decreased breath sounds. She has no wheezes. She has no rhonchi. She has no rales.  Abdominal: Soft. Normal appearance and bowel sounds are normal. There is no tenderness.  Musculoskeletal:       Hands: PIP joint in middle finger hot swelling, very tender to light touch, decreased ROM, no other fingers swollen.  Neurological: She is alert.  Skin: Skin is warm, dry and intact. No rash noted.  Psychiatric: Her speech is normal and behavior is normal. Judgment and thought content normal. Her mood appears not anxious. Cognition and memory are normal. She does not exhibit a depressed mood.          Assessment & Plan:

## 2014-12-22 ENCOUNTER — Ambulatory Visit (INDEPENDENT_AMBULATORY_CARE_PROVIDER_SITE_OTHER): Payer: Medicare Other | Admitting: Family Medicine

## 2014-12-22 ENCOUNTER — Encounter: Payer: Self-pay | Admitting: Family Medicine

## 2014-12-22 VITALS — BP 128/70 | HR 64 | Temp 98.5°F | Ht 62.0 in | Wt 210.2 lb

## 2014-12-22 DIAGNOSIS — M10341 Gout due to renal impairment, right hand: Secondary | ICD-10-CM

## 2014-12-22 DIAGNOSIS — M79644 Pain in right finger(s): Secondary | ICD-10-CM | POA: Insufficient documentation

## 2014-12-22 DIAGNOSIS — M109 Gout, unspecified: Secondary | ICD-10-CM

## 2014-12-22 DIAGNOSIS — M10041 Idiopathic gout, right hand: Secondary | ICD-10-CM | POA: Diagnosis not present

## 2014-12-22 NOTE — Progress Notes (Signed)
Pre visit review using our clinic review tool, if applicable. No additional management support is needed unless otherwise documented below in the visit note. 

## 2014-12-22 NOTE — Patient Instructions (Signed)
Work on low uric acid diet, stop sardines, decrease beans.  Complete course until soreness and swelling gone. Complete antibiotics just in case. Drink a lot of water. Can try cherry juice for natural treatment. Call for appt with Dr. Darnell Level if  Severe pain recurring frequently for long term gout treatment.   Low-Purine Diet Purines are compounds that affect the level of uric acid in your body. A low-purine diet is a diet that is low in purines. Eating a low-purine diet can prevent the level of uric acid in your body from getting too high and causing gout or kidney stones or both. WHAT DO I NEED TO KNOW ABOUT THIS DIET?  Choose low-purine foods. Examples of low-purine foods are listed in the next section.  Drink plenty of fluids, especially water. Fluids can help remove uric acid from your body. Try to drink 8-16 cups (1.9-3.8 L) a day.  Limit foods high in fat, especially saturated fat, as fat makes it harder for the body to get rid of uric acid. Foods high in saturated fat include pizza, cheese, ice cream, whole milk, fried foods, and gravies. Choose foods that are lower in fat and lean sources of protein. Use olive oil when cooking as it contains healthy fats that are not high in saturated fat.  Limit alcohol. Alcohol interferes with the elimination of uric acid from your body. If you are having a gout attack, avoid all alcohol.  Keep in mind that different people's bodies react differently to different foods. You will probably learn over time which foods do or do not affect you. If you discover that a food tends to cause your gout to flare up, avoid eating that food. You can more freely enjoy foods that do not cause problems. If you have any questions about a food item, talk to your dietitian or health care provider. WHICH FOODS ARE LOW, MODERATE, AND HIGH IN PURINES? The following is a list of foods that are low, moderate, and high in purines. You can eat any amount of the foods that are low in  purines. You may be able to have small amounts of foods that are moderate in purines. Ask your health care provider how much of a food moderate in purines you can have. Avoid foods high in purines. Grains  Foods low in purines: Enriched white bread, pasta, rice, cake, cornbread, popcorn.  Foods moderate in purines: Whole-grain breads and cereals, wheat germ, bran, oatmeal. Uncooked oatmeal. Dry wheat bran or wheat germ.  Foods high in purines: Pancakes, [place] toast, biscuits, muffins. Vegetables  Foods low in purines: All vegetables, except those that are moderate in purines.  Foods moderate in purines: Asparagus, cauliflower, spinach, mushrooms, green peas. Fruits  All fruits are low in purines. Meats and other Protein Foods  Foods low in purines: Eggs, nuts, peanut butter.  Foods moderate in purines: 80-90% lean beef, lamb, veal, pork, poultry, fish, eggs, peanut butter, nuts. Crab, lobster, oysters, and shrimp. Cooked dried beans, peas, and lentils.  Foods high in purines: Anchovies, sardines, herring, mussels, tuna, codfish, scallops, trout, and haddock. Berniece Salines. Organ meats (such as liver or kidney). Tripe. Game meat. Goose. Sweetbreads. Dairy  All dairy foods are low in purines. Low-fat and fat-free dairy products are best because they are low in saturated fat. Beverages  Drinks low in purines: Water, carbonated beverages, tea, coffee, cocoa.  Drinks moderate in purines: Soft drinks and other drinks sweetened with high-fructose corn syrup. Juices. To find whether a food or drink  is sweetened with high-fructose corn syrup, look at the ingredients list.  Drinks high in purines: Alcoholic beverages (such as beer). Condiments  Foods low in purines: Salt, herbs, olives, pickles, relishes, vinegar.  Foods moderate in purines: Butter, margarine, oils, mayonnaise. Fats and Oils  Foods low in purines: All types, except gravies and sauces made with meat.  Foods high in purines:  Gravies and sauces made with meat. Other Foods  Foods low in purines: Sugars, sweets, gelatin. Cake. Soups made without meat.  Foods moderate in purines: Meat-based or fish-based soups, broths, or bouillons. Foods and drinks sweetened with high-fructose corn syrup.  Foods high in purines: High-fat desserts (such as ice cream, cookies, cakes, pies, doughnuts, and chocolate). Contact your dietitian for more information on foods that are not listed here. Document Released: 08/05/2010 Document Revised: 04/15/2013 Document Reviewed: 03/17/2013 [facility] Patient Information 2015 [place], [place]. This information is not intended to replace advice given to you by your health care provider. Make sure you discuss any questions you have with your health care provider.

## 2014-12-22 NOTE — Progress Notes (Signed)
   Subjective:    Patient ID: Kristen [name], female    DOB: 04-28-1940, 74 y.o.   MRN: [id number]  HPI 74 year old female presents for follow up on swelling and redness in  right center finger.  At last OV felt likely gout but given she is a diabetic... Treated also with antibiotics.  Started on colchicine given uric acid 7.3. Had oxycodone to use for pain.  Today she reports the colchicine  helped dramatically. Much more than the oxycodone.  No SE.    Review of Systems  Constitutional: Negative for fever and fatigue.  HENT: Negative for ear pain.   Eyes: Negative for pain.  Respiratory: Negative for chest tightness and shortness of breath.   Cardiovascular: Negative for chest pain, palpitations and leg swelling.  Gastrointestinal: Negative for abdominal pain.  Genitourinary: Negative for dysuria.       Objective:   Physical Exam  Constitutional: Vital signs are normal. She appears well-developed and well-nourished. She is cooperative.  Non-toxic appearance. She does not appear ill. No distress.  HENT:  Head: Normocephalic.  Right Ear: Hearing, tympanic membrane, external ear and ear canal normal. Tympanic membrane is not erythematous, not retracted and not bulging.  Left Ear: Hearing, tympanic membrane, external ear and ear canal normal. Tympanic membrane is not erythematous, not retracted and not bulging.  Nose: No mucosal edema or rhinorrhea. Right sinus exhibits no maxillary sinus tenderness and no frontal sinus tenderness. Left sinus exhibits no maxillary sinus tenderness and no frontal sinus tenderness.  Mouth/Throat: Uvula is midline, oropharynx is clear and moist and mucous membranes are normal.  Eyes: Conjunctivae, EOM and lids are normal. Pupils are equal, round, and reactive to light. Lids are everted and swept, no foreign bodies found.  Neck: Trachea normal and normal range of motion. Neck supple. Carotid bruit is not present. No thyroid mass and no thyromegaly  present.  Cardiovascular: Normal rate, regular rhythm, S1 normal, S2 normal, normal heart sounds, intact distal pulses and normal pulses.  Exam reveals no gallop and no friction rub.   No murmur heard. Pulmonary/Chest: Effort normal and breath sounds normal. No tachypnea. No respiratory distress. She has no decreased breath sounds. She has no wheezes. She has no rhonchi. She has no rales.  Abdominal: Soft. Normal appearance and bowel sounds are normal. There is no tenderness.  Musculoskeletal:       Hands: Swelling in right central finger, no pain, no redness  Neurological: She is alert.  Skin: Skin is warm, dry and intact. No rash noted.  Psychiatric: Her speech is normal and behavior is normal. Judgment and thought content normal. Her mood appears not anxious. Cognition and memory are normal. She does not exhibit a depressed mood.          Assessment & Plan:

## 2014-12-22 NOTE — Assessment & Plan Note (Signed)
Improved with colchicine. Info provided and reviewed on  Low uric acid diet. If continued flares, discussed prophylactic allopurinol with PCP.

## 2015-01-06 ENCOUNTER — Other Ambulatory Visit: Payer: Self-pay | Admitting: Adult Health

## 2015-02-09 ENCOUNTER — Other Ambulatory Visit: Payer: Self-pay | Admitting: *Deleted

## 2015-02-09 MED ORDER — AMLODIPINE BESYLATE 5 MG PO TABS: ORAL_TABLET | ORAL | Status: DC

## 2015-02-09 NOTE — Telephone Encounter (Signed)
Patient called stating that she only has enough of her Amlodipine to last her for 10 days and does not have an appointment scheduled until 03/10/15. Advised patient a refill will be sent to her pharmacy to last her until her appointment. Refill sent to pharmacy electronically.

## 2015-03-10 ENCOUNTER — Encounter: Payer: Self-pay | Admitting: Family Medicine

## 2015-03-10 ENCOUNTER — Ambulatory Visit (INDEPENDENT_AMBULATORY_CARE_PROVIDER_SITE_OTHER): Payer: Medicare Other | Admitting: Family Medicine

## 2015-03-10 VITALS — BP 134/76 | HR 74 | Temp 98.1°F | Ht 62.0 in | Wt 207.1 lb

## 2015-03-10 DIAGNOSIS — IMO0001 Reserved for inherently not codable concepts without codable children: Secondary | ICD-10-CM

## 2015-03-10 DIAGNOSIS — E1165 Type 2 diabetes mellitus with hyperglycemia: Secondary | ICD-10-CM | POA: Diagnosis not present

## 2015-03-10 DIAGNOSIS — Z23 Encounter for immunization: Secondary | ICD-10-CM | POA: Diagnosis not present

## 2015-03-10 DIAGNOSIS — M1712 Unilateral primary osteoarthritis, left knee: Secondary | ICD-10-CM

## 2015-03-10 DIAGNOSIS — H9193 Unspecified hearing loss, bilateral: Secondary | ICD-10-CM

## 2015-03-10 DIAGNOSIS — Z0000 Encounter for general adult medical examination without abnormal findings: Secondary | ICD-10-CM | POA: Diagnosis not present

## 2015-03-10 DIAGNOSIS — I10 Essential (primary) hypertension: Secondary | ICD-10-CM

## 2015-03-10 DIAGNOSIS — Z1211 Encounter for screening for malignant neoplasm of colon: Secondary | ICD-10-CM

## 2015-03-10 DIAGNOSIS — E2839 Other primary ovarian failure: Secondary | ICD-10-CM

## 2015-03-10 DIAGNOSIS — I2510 Atherosclerotic heart disease of native coronary artery without angina pectoris: Secondary | ICD-10-CM

## 2015-03-10 DIAGNOSIS — E785 Hyperlipidemia, unspecified: Secondary | ICD-10-CM | POA: Diagnosis not present

## 2015-03-10 DIAGNOSIS — M10041 Idiopathic gout, right hand: Secondary | ICD-10-CM

## 2015-03-10 DIAGNOSIS — Z7189 Other specified counseling: Secondary | ICD-10-CM

## 2015-03-10 DIAGNOSIS — R2230 Localized swelling, mass and lump, unspecified upper limb: Secondary | ICD-10-CM | POA: Insufficient documentation

## 2015-03-10 DIAGNOSIS — R2231 Localized swelling, mass and lump, right upper limb: Secondary | ICD-10-CM

## 2015-03-10 DIAGNOSIS — E118 Type 2 diabetes mellitus with unspecified complications: Secondary | ICD-10-CM

## 2015-03-10 DIAGNOSIS — IMO0002 Reserved for concepts with insufficient information to code with codable children: Secondary | ICD-10-CM

## 2015-03-10 DIAGNOSIS — M109 Gout, unspecified: Secondary | ICD-10-CM

## 2015-03-10 LAB — COMPREHENSIVE METABOLIC PANEL
ALBUMIN: 3.4 g/dL — AB (ref 3.5–5.2)
ALT: 11 U/L (ref 0–35)
AST: 14 U/L (ref 0–37)
Alkaline Phosphatase: 93 U/L (ref 39–117)
BUN: 14 mg/dL (ref 6–23)
CALCIUM: 9.4 mg/dL (ref 8.4–10.5)
CHLORIDE: 95 meq/L — AB (ref 96–112)
CO2: 34 meq/L — AB (ref 19–32)
CREATININE: 1.01 mg/dL (ref 0.40–1.20)
GFR: 68.99 mL/min (ref >60.00)
Glucose, Bld: 316 mg/dL — ABNORMAL HIGH (ref 70–99)
POTASSIUM: 3.3 meq/L — AB (ref 3.5–5.1)
Sodium: 136 mEq/L (ref 135–145)
Total Bilirubin: 0.6 mg/dL (ref 0.2–1.2)
Total Protein: 7.5 g/dL (ref 6.0–8.3)

## 2015-03-10 LAB — LIPID PANEL
CHOL/HDL RATIO: 4
Cholesterol: 134 mg/dL (ref 0–200)
HDL: 36.8 mg/dL — ABNORMAL LOW (ref >39.00)
LDL CALC: 78 mg/dL (ref 0–99)
NonHDL: 96.98
TRIGLYCERIDES: 97 mg/dL (ref 0.0–149.0)
VLDL: 19.4 mg/dL (ref 0.0–40.0)

## 2015-03-10 LAB — URIC ACID: URIC ACID, SERUM: 3.9 mg/dL (ref 2.4–7.0)

## 2015-03-10 LAB — HEMOGLOBIN A1C: HEMOGLOBIN A1C: 9.3 % — AB (ref 4.6–6.5)

## 2015-03-10 LAB — TSH: TSH: 3.18 u[IU]/mL (ref 0.35–4.50)

## 2015-03-10 MED ORDER — LOSARTAN POTASSIUM 100 MG PO TABS: 100.0000 mg | ORAL_TABLET | Freq: Every day | ORAL | Status: DC

## 2015-03-10 MED ORDER — GLIMEPIRIDE 2 MG PO TABS: 2.0000 mg | ORAL_TABLET | Freq: Every day | ORAL | Status: DC

## 2015-03-10 MED ORDER — AMLODIPINE BESYLATE 5 MG PO TABS: ORAL_TABLET | ORAL | Status: DC

## 2015-03-10 MED ORDER — SIMVASTATIN 20 MG PO TABS: 20.0000 mg | ORAL_TABLET | Freq: Every day | ORAL | Status: DC

## 2015-03-10 NOTE — Assessment & Plan Note (Signed)
Recent flare has resolved. Check uric acid today.

## 2015-03-10 NOTE — Assessment & Plan Note (Signed)
Right axillary knot felt today - will check mammo/US. ?LN vs scarring from prior boil.

## 2015-03-10 NOTE — Progress Notes (Signed)
Pre visit review using our clinic review tool, if applicable. No additional management support is needed unless otherwise documented below in the visit note. 

## 2015-03-10 NOTE — Addendum Note (Signed)
Addended by: Jacqualin Combes on: 03/10/2015 11:41 AM   Modules accepted: Orders

## 2015-03-10 NOTE — Assessment & Plan Note (Signed)
No regular exercise

## 2015-03-10 NOTE — Assessment & Plan Note (Signed)
S/p R TKR 

## 2015-03-10 NOTE — Progress Notes (Signed)
BP 134/76 mmHg  Pulse 74  Temp(Src) 98.1 F (36.7 C) (Oral)  Ht 5' 2" (1.575 m)  Wt 207 lb 1.9 oz (93.949 kg)  BMI 37.87 kg/m2  SpO2 98%   CC: medicare wellness visit  Subjective:    Patient ID: Kristen [name], female    DOB: 22-Sep-1940, 74 y.o.   MRN: [id number]  HPI: Kristen [name] is a 74 y.o. female presenting on 03/10/2015 for Annual Exam   H/o PE - 2012, completed 6 months of coumadin. Continues aspirin daily. Saw hematology Dr. Cynda Acres. Workup not available.   S/p L TKR 08/2104, recovered well.   Failed hearing screen - Terris has noticed she has to increase volume on TV. Agrees to audiology referral. Passes vision screen No falls in last year. Denies depression/anhedonia.  Preventative: S/p colonoscopy "a while ago" with Dr. Brunetta Genera. No records received. Denies blood in stool. No fmhx colon cancer. Recommended stool kit today. Well woman - s/p hysterectomy for fibroids. Ovaries remain. Discussed GYN cancers.  Mammogram - has not had in years. Will consider next year. Breast exam today. Does not do breast exams at home.  DEXA - has not received. Interested in setting up Flu shot - declines prevnar 08/2013, pneumovax today. zostavax - 03/2014 Advanced directive: does not have set up. Packet provided today. Doesn't have HCPOA in mind - probably sons. Doesn't want prolonged life support. Seat belt use discussed  Lives alone  4 grown children  Education: 11th grade Occupation: retired, was Scientist, water quality  Activity: Does not regularly exercise. Activity level limited by L knee pain.  Diet: good water, fruits/vegetables seldom   Relevant past medical, surgical, family and social history reviewed and updated as indicated. Interim medical history since our last visit reviewed. Allergies and medications reviewed and updated. Current Outpatient Prescriptions on File Prior to Visit  Medication Sig  . baclofen (LIORESAL) 10 MG tablet Take 1 tablet (10 mg total) by  mouth 3 (three) times daily. As needed for muscle spasm  . Blood Glucose Monitoring Suppl (ONE TOUCH ULTRA SYSTEM KIT) W/DEVICE KIT 1 kit by Does not apply route once.  . colchicine 0.6 MG tablet 2 tabs po x 1 then repeat 1 tab in 2 hour, continue 1 tab twice daily until pain resolved  . glucose blood test strip 250.02 Use as instructed  . nitroGLYCERIN (NITROSTAT) 0.4 MG SL tablet Place 0.4 mg under the tongue every 5 (five) minutes as needed for chest pain.  Marland Kitchen sennosides-docusate sodium (SENOKOT-S) 8.6-50 MG tablet Take 2 tablets by mouth daily.  . vitamin B-12 (CYANOCOBALAMIN) 500 MCG tablet Take 500 mcg by mouth daily.   No current facility-administered medications on file prior to visit.    Review of Systems Per HPI unless specifically indicated in ROS section     Objective:    BP 134/76 mmHg  Pulse 74  Temp(Src) 98.1 F (36.7 C) (Oral)  Ht 5' 2" (1.575 m)  Wt 207 lb 1.9 oz (93.949 kg)  BMI 37.87 kg/m2  SpO2 98%  Wt Readings from Last 3 Encounters:  03/10/15 207 lb 1.9 oz (93.949 kg)  12/22/14 210 lb 4 oz (95.369 kg)  12/18/14 208 lb 4 oz (94.462 kg)    Physical Exam  Constitutional: She is oriented to person, place, and time. She appears well-developed and well-nourished. No distress.  HENT:  Head: Normocephalic and atraumatic.  Right Ear: Hearing, tympanic membrane, external ear and ear canal normal.  Left Ear: Hearing, tympanic membrane, external  ear and ear canal normal.  Nose: Nose normal.  Mouth/Throat: Uvula is midline, oropharynx is clear and moist and mucous membranes are normal. No oropharyngeal exudate, posterior oropharyngeal edema or posterior oropharyngeal erythema.  Eyes: Conjunctivae and EOM are normal. Pupils are equal, round, and reactive to light. No scleral icterus.  Neck: Normal range of motion. Neck supple. Carotid bruit is not present. No thyromegaly present.  Cardiovascular: Normal rate, regular rhythm, normal heart sounds and intact distal pulses.    No murmur heard. Pulses:      Radial pulses are 2+ on the right side, and 2+ on the left side.  Pulmonary/Chest: Effort normal and breath sounds normal. No respiratory distress. She has no wheezes. She has no rales. Right breast exhibits no inverted nipple, no mass, no nipple discharge and no skin change. Left breast exhibits no inverted nipple, no mass, no nipple discharge and no skin change.  Right axillary knot, bumpy right breast  Abdominal: Soft. Bowel sounds are normal. She exhibits no distension and no mass. There is no tenderness. There is no rebound and no guarding.  Musculoskeletal: Normal range of motion. She exhibits no edema.  Lymphadenopathy:       Head (right side): No submental, no submandibular, no tonsillar, no preauricular and no posterior auricular adenopathy present.       Head (left side): No submental, no submandibular, no tonsillar, no preauricular and no posterior auricular adenopathy present.    She has no cervical adenopathy.    She has axillary adenopathy.       Right axillary: Lateral adenopathy present.       Left axillary: No lateral adenopathy present.      Right: No supraclavicular adenopathy present.       Left: No supraclavicular adenopathy present.  Neurological: She is alert and oriented to person, place, and time.  CN grossly intact, station and gait intact  Skin: Skin is warm and dry. No rash noted.  Psychiatric: She has a normal mood and affect. Her behavior is normal. Judgment and thought content normal.  Nursing note and vitals reviewed.  Results for orders placed or performed in visit on 12/18/14  CBC with Differential/Platelet  Result Value Ref Range   WBC 9.0 4.0 - 10.5 K/uL   RBC 4.57 3.87 - 5.11 Mil/uL   Hemoglobin 11.8 (L) 12.0 - 15.0 g/dL   HCT 37.5 36.0 - 46.0 %   MCV 82.0 78.0 - 100.0 fl   MCHC 31.5 30.0 - 36.0 g/dL   RDW 17.3 (H) 11.5 - 15.5 %   Platelets 273.0 150.0 - 400.0 K/uL   Neutrophils Relative % 72.8 43.0 - 77.0 %    Lymphocytes Relative 21.6 12.0 - 46.0 %   Monocytes Relative 5.0 3.0 - 12.0 %   Eosinophils Relative 0.3 0.0 - 5.0 %   Basophils Relative 0.3 0.0 - 3.0 %   Neutro Abs 6.6 1.4 - 7.7 K/uL   Lymphs Abs 2.0 0.7 - 4.0 K/uL   Monocytes Absolute 0.5 0.1 - 1.0 K/uL   Eosinophils Absolute 0.0 0.0 - 0.7 K/uL   Basophils Absolute 0.0 0.0 - 0.1 K/uL  Uric acid  Result Value Ref Range   Uric Acid, Serum 7.2 (H) 2.4 - 7.0 mg/dL      Assessment & Plan:   Problem List Items Addressed This Visit    Osteoarthritis    S/p R TKR.       Obesity, Class II, BMI 35-39.9, with comorbidity (HCC)    No  regular exercise      Relevant Medications   glimepiride (AMARYL) 2 MG tablet   Medicare annual wellness visit, subsequent - Primary    I have personally reviewed the Medicare Annual Wellness questionnaire and have noted 1. The patient's medical and social history 2. Their use of alcohol, tobacco or illicit drugs 3. Their current medications and supplements 4. The patient's functional ability including ADL's, fall risks, home safety risks and hearing or visual impairment. Cognitive function has been assessed and addressed as indicated.  5. Diet and physical activity 6. Evidence for depression or mood disorders The patients weight, height, BMI have been recorded in the chart. I have made referrals, counseling and provided education to the patient based on review of the above and I have provided the pt with a written personalized care plan for preventive services. Provider list updated.. See scanned questionairre as needed for further documentation. Reviewed preventative protocols and updated unless pt declined.       HYPERTENSION, BENIGN    Chronic, stable. Continue current regimen.      Relevant Medications   simvastatin (ZOCOR) 20 MG tablet   losartan (COZAAR) 100 MG tablet   amLODipine (NORVASC) 5 MG tablet   Other Relevant Orders   Comprehensive metabolic panel   TSH   HLD (hyperlipidemia)     Check FLP today. Continue simvastatin 44m daily.      Relevant Medications   simvastatin (ZOCOR) 20 MG tablet   losartan (COZAAR) 100 MG tablet   amLODipine (NORVASC) 5 MG tablet   Other Relevant Orders   Lipid panel   Health maintenance examination    Preventative protocols reviewed and updated unless pt declined. Discussed healthy diet and lifestyle.       Gout    Recent flare has resolved. Check uric acid today.      Relevant Orders   Uric acid   Di[place] 2, uncontrolled [place]    Check labs today.  Continue amaryl. Lab Results  Component Value Date   HGBA1C 8.3* 09/10/2014        Relevant Medications   simvastatin (ZOCOR) 20 MG tablet   losartan (COZAAR) 100 MG tablet   glimepiride (AMARYL) 2 MG tablet   Other Relevant Orders   Hemoglobin A1c   CAD (coronary artery disease)    Stable, asxs.      Relevant Medications   simvastatin (ZOCOR) 20 MG tablet   losartan (COZAAR) 100 MG tablet   amLODipine (NORVASC) 5 MG tablet   Axillary mass    Right axillary knot felt today - will check mammo/US. ?LN vs scarring from prior boil.       Relevant Orders   MM Digital Diagnostic Bilat   [place]AST COMPLETE UN[place] LEFT INC AXILLA   [place]TE UN[place] RIGHT INC AXILLA   Advanced care planning/counseling discussion    Advanced directive: does not have set up. Packet provided today. Doesn't have HCPOA in mind - probably sons. Doesn't want prolonged life support.       Other Visit Diagnoses    Hearing loss, bilateral        Relevant Orders    Ambulatory referral to Audiology    Special screening for malignant neoplasms, colon        Relevant Orders    Fecal occult blood, imunochemical    Estrogen deficiency        Relevant Orders    DG Bone Density        Follow up plan: Return  in about 6 months (around 09/07/2015), or as needed, for follow up visit.

## 2015-03-10 NOTE — Assessment & Plan Note (Signed)
Check labs today.  Continue amaryl. Lab Results  Component Value Date   HGBA1C 8.3* 09/10/2014

## 2015-03-10 NOTE — Assessment & Plan Note (Signed)
Stable, asxs. 

## 2015-03-10 NOTE — Assessment & Plan Note (Addendum)
Check FLP today. Continue simvastatin 20mg  daily.

## 2015-03-10 NOTE — Assessment & Plan Note (Signed)
Chronic, stable. Continue current regimen. 

## 2015-03-10 NOTE — Patient Instructions (Addendum)
Pass by lab to pick up stool kit.  labwork today. Pneumovax today. Advanced directive packet provided today. We will refer you for bone density scan Return as needed or in 6 months for follow up visit.   Health Maintenance, Female Adopting a healthy lifestyle and getting preventive care can go a long way to promote health and wellness. Talk with your health care provider about what schedule of regular examinations is right for you. This is a good chance for you to check in with your provider about disease prevention and staying healthy. In between checkups, there are plenty of things you can do on your own. Experts have done a lot of research about which lifestyle changes and preventive measures are most likely to keep you healthy. Ask your health care provider for more information. WEIGHT AND DIET  Eat a healthy diet  Be sure to include plenty of vegetables, fruits, low-fat dairy products, and lean protein.  Do not eat a lot of foods high in solid fats, added sugars, or salt.  Get regular exercise. This is one of the most important things you can do for your health.  Most adults should exercise for at least 150 minutes each week. The exercise should increase your heart rate and make you sweat (moderate-intensity exercise).  Most adults should also do strengthening exercises at least twice a week. This is in addition to the moderate-intensity exercise.  Maintain a healthy weight  Body mass index (BMI) is a measurement that can be used to identify possible weight problems. It estimates body fat based on height and weight. Your health care provider can help determine your BMI and help you achieve or maintain a healthy weight.  For females 75 years of age and older:   A BMI below 18.5 is considered underweight.  A BMI of 18.5 to 24.9 is normal.  A BMI of 25 to 29.9 is considered overweight.  A BMI of 30 and above is considered obese.  Watch levels of cholesterol and blood  lipids  You should start having your blood tested for lipids and cholesterol at 73 years of age, then have this test every 5 years.  You may need to have your cholesterol levels checked more often if:  Your lipid or cholesterol levels are high.  You are older than 73 years of age.  You are at high risk for heart disease.  CANCER SCREENING   Lung Cancer  Lung cancer screening is recommended for adults 60-41 years old who are at high risk for lung cancer because of a history of smoking.  A yearly low-dose CT scan of the lungs is recommended for people who:  Currently smoke.  Have quit within the past 15 years.  Have at least a 30-pack-year history of smoking. A pack year is smoking an average of one pack of cigarettes a day for 1 year.  Yearly screening should continue until it has been 15 years since you quit.  Yearly screening should stop if you develop a health problem that would prevent you from having lung cancer treatment.  Breast Cancer  Practice breast self-awareness. This means understanding how your breasts normally appear and feel.  It also means doing regular breast self-exams. Let your health care provider know about any changes, no matter how small.  If you are in your 20s or 30s, you should have a clinical breast exam (CBE) by a health care provider every 1-3 years as part of a regular health exam.  If you are  28 or older, have a CBE every year. Also consider having a breast X-ray (mammogram) every year.  If you have a family history of breast cancer, talk to your health care provider about genetic screening.  If you are at high risk for breast cancer, talk to your health care provider about having an MRI and a mammogram every year.  Breast cancer gene (BRCA) assessment is recommended for women who have family members with BRCA-related cancers. BRCA-related cancers include:  Breast.  Ovarian.  Tubal.  Peritoneal cancers.  Results of the assessment  will determine the need for genetic counseling and BRCA1 and BRCA2 testing. Cervical Cancer Your health care provider may recommend that you be screened regularly for cancer of the pelvic organs (ovaries, uterus, and vagina). This screening involves a pelvic examination, including checking for microscopic changes to the surface of your cervix (Pap test). You may be encouraged to have this screening done every 3 years, beginning at age 61.  For women ages 27-65, health care providers may recommend pelvic exams and Pap testing every 3 years, or they may recommend the Pap and pelvic exam, combined with testing for human papilloma virus (HPV), every 5 years. Some types of HPV increase your risk of cervical cancer. Testing for HPV may also be done on women of any age with unclear Pap test results.  Other health care providers may not recommend any screening for nonpregnant women who are considered low risk for pelvic cancer and who do not have symptoms. Ask your health care provider if a screening pelvic exam is right for you.  If you have had past treatment for cervical cancer or a condition that could lead to cancer, you need Pap tests and screening for cancer for at least 20 years after your treatment. If Pap tests have been discontinued, your risk factors (such as having a new sexual partner) need to be reassessed to determine if screening should resume. Some women have medical problems that increase the chance of getting cervical cancer. In these cases, your health care provider may recommend more frequent screening and Pap tests. Colorectal Cancer  This type of cancer can be detected and often prevented.  Routine colorectal cancer screening usually begins at 73 years of age and continues through 73 years of age.  Your health care provider may recommend screening at an earlier age if you have risk factors for colon cancer.  Your health care provider may also recommend using home test kits to check  for hidden blood in the stool.  A small camera at the end of a tube can be used to examine your colon directly (sigmoidoscopy or colonoscopy). This is done to check for the earliest forms of colorectal cancer.  Routine screening usually begins at age 57.  Direct examination of the colon should be repeated every 5-10 years through 73 years of age. However, you may need to be screened more often if early forms of precancerous polyps or small growths are found. Skin Cancer  Check your skin from head to toe regularly.  Tell your health care provider about any new moles or changes in moles, especially if there is a change in a mole's shape or color.  Also tell your health care provider if you have a mole that is larger than the size of a pencil eraser.  Always use sunscreen. Apply sunscreen liberally and repeatedly throughout the day.  Protect yourself by wearing long sleeves, pants, a wide-brimmed hat, and sunglasses whenever you are outside. HEART  DISEASE, DIABETES, AND HIGH BLOOD PRESSURE   High blood pressure causes heart disease and increases the risk of stroke. High blood pressure is more likely to develop in:  People who have blood pressure in the high end of the normal range (130-139/85-89 mm Hg).  People who are overweight or obese.  People who are African American.  If you are 44-66 years of age, have your blood pressure checked every 3-5 years. If you are 51 years of age or older, have your blood pressure checked every year. You should have your blood pressure measured twice--once when you are at a hospital or clinic, and once when you are not at a hospital or clinic. Record the average of the two measurements. To check your blood pressure when you are not at a hospital or clinic, you can use:  An automated blood pressure machine at a pharmacy.  A home blood pressure monitor.  If you are between 92 years and 27 years old, ask your health care provider if you should take  aspirin to prevent strokes.  Have regular diabetes screenings. This involves taking a blood sample to check your fasting blood sugar level.  If you are at a normal weight and have a low risk for diabetes, have this test once every three years after 73 years of age.  If you are overweight and have a high risk for diabetes, consider being tested at a younger age or more often. PREVENTING INFECTION  Hepatitis B  If you have a higher risk for hepatitis B, you should be screened for this virus. You are considered at high risk for hepatitis B if:  You were born in a country where hepatitis B is common. Ask your health care provider which countries are considered high risk.  Your parents were born in a high-risk country, and you have not been immunized against hepatitis B (hepatitis B vaccine).  You have HIV or AIDS.  You use needles to inject street drugs.  You live with someone who has hepatitis B.  You have had sex with someone who has hepatitis B.  You get hemodialysis treatment.  You take certain medicines for conditions, including cancer, organ transplantation, and autoimmune conditions. Hepatitis C  Blood testing is recommended for:  Everyone born from [birth year] through 1964.  Anyone with known risk factors for hepatitis C. Sexually transmitted infections (STIs)  You should be screened for sexually transmitted infections (STIs) including gonorrhea and chlamydia if:  You are sexually active and are younger than 73 years of age.  You are older than 73 years of age and your health care provider tells you that you are at risk for this type of infection.  Your sexual activity has changed since you were last screened and you are at an increased risk for chlamydia or gonorrhea. Ask your health care provider if you are at risk.  If you do not have HIV, but are at risk, it may be recommended that you take a prescription medicine daily to prevent HIV infection. This is called  pre-exposure prophylaxis (PrEP). You are considered at risk if:  You are sexually active and do not regularly use condoms or know the HIV status of your partner(s).  You take drugs by injection.  You are sexually active with a partner who has HIV. Talk with your health care provider about whether you are at high risk of being infected with HIV. If you choose to begin PrEP, you should first be tested for HIV. You should  then be tested every 3 months for as long as you are taking PrEP.  PREGNANCY   If you are premenopausal and you may become pregnant, ask your health care provider about preconception counseling.  If you may become pregnant, take 400 to 800 micrograms (mcg) of folic acid every day.  If you want to prevent pregnancy, talk to your health care provider about birth control (contraception). OSTEOPOROSIS AND MENOPAUSE   Osteoporosis is a disease in which the bones lose minerals and strength with aging. This can result in serious bone fractures. Your risk for osteoporosis can be identified using a bone density scan.  If you are 43 years of age or older, or if you are at risk for osteoporosis and fractures, ask your health care provider if you should be screened.  Ask your health care provider whether you should take a calcium or vitamin D supplement to lower your risk for osteoporosis.  Menopause may have certain physical symptoms and risks.  Hormone replacement therapy may reduce some of these symptoms and risks. Talk to your health care provider about whether hormone replacement therapy is right for you.  HOME CARE INSTRUCTIONS   Schedule regular health, dental, and eye exams.  Stay current with your immunizations.   Do not use any tobacco products including cigarettes, chewing tobacco, or electronic cigarettes.  If you are pregnant, do not drink alcohol.  If you are breastfeeding, limit how much and how often you drink alcohol.  Limit alcohol intake to no more than 1  drink per day for nonpregnant women. One drink equals 12 ounces of beer, 5 ounces of wine, or 1 ounces of hard liquor.  Do not use street drugs.  Do not share needles.  Ask your health care provider for help if you need support or information about quitting drugs.  Tell your health care provider if you often feel depressed.  Tell your health care provider if you have ever been abused or do not feel safe at home.   This information is not intended to replace advice given to you by your health care provider. Make sure you discuss any questions you have with your health care provider.   Document Released: 10/24/2010 Document Revised: 05/01/2014 Document Reviewed: 03/12/2013 Elsevier Interactive Patient Education [employer].

## 2015-03-10 NOTE — Assessment & Plan Note (Signed)
Preventative protocols reviewed and updated unless pt declined. Discussed healthy diet and lifestyle.  

## 2015-03-10 NOTE — Assessment & Plan Note (Signed)

## 2015-03-10 NOTE — Assessment & Plan Note (Signed)
Advanced directive: does not have set up. Packet provided today. Doesn't have HCPOA in mind - probably sons. Doesn't want prolonged life support.

## 2015-03-13 ENCOUNTER — Encounter: Payer: Self-pay | Admitting: Family Medicine

## 2015-03-13 ENCOUNTER — Other Ambulatory Visit: Payer: Self-pay | Admitting: Family Medicine

## 2015-03-13 MED ORDER — GLIMEPIRIDE 4 MG PO TABS: 4.0000 mg | ORAL_TABLET | Freq: Every day | ORAL | Status: DC

## 2015-03-16 ENCOUNTER — Telehealth: Payer: Self-pay | Admitting: Family Medicine

## 2015-03-16 NOTE — Telephone Encounter (Signed)
Spoke with patient.

## 2015-03-16 NOTE — Telephone Encounter (Signed)
Patient returned Kim's call. °

## 2015-03-25 DIAGNOSIS — H903 Sensorineural hearing loss, bilateral: Secondary | ICD-10-CM | POA: Diagnosis not present

## 2015-03-25 DIAGNOSIS — M26622 Arthralgia of left temporomandibular joint: Secondary | ICD-10-CM | POA: Diagnosis not present

## 2015-03-25 DIAGNOSIS — H9202 Otalgia, left ear: Secondary | ICD-10-CM | POA: Diagnosis not present

## 2015-03-29 ENCOUNTER — Encounter: Payer: Self-pay | Admitting: Family Medicine

## 2015-03-29 DIAGNOSIS — H903 Sensorineural hearing loss, bilateral: Secondary | ICD-10-CM | POA: Insufficient documentation

## 2015-04-21 ENCOUNTER — Other Ambulatory Visit: Payer: Self-pay | Admitting: Family Medicine

## 2015-04-21 ENCOUNTER — Ambulatory Visit
Admission: RE | Admit: 2015-04-21 | Discharge: 2015-04-21 | Disposition: A | Discharge status: Home / Self Care | Payer: Medicare Other | Source: Ambulatory Visit | Attending: Family Medicine | Admitting: Family Medicine

## 2015-04-21 DIAGNOSIS — D179 Benign lipomatous neoplasm, unspecified: Secondary | ICD-10-CM | POA: Diagnosis not present

## 2015-04-21 DIAGNOSIS — R2231 Localized swelling, mass and lump, right upper limb: Secondary | ICD-10-CM

## 2015-04-21 DIAGNOSIS — E2839 Other primary ovarian failure: Secondary | ICD-10-CM

## 2015-04-21 DIAGNOSIS — Z780 Asymptomatic menopausal state: Secondary | ICD-10-CM | POA: Diagnosis not present

## 2015-04-21 DIAGNOSIS — R928 Other abnormal and inconclusive findings on diagnostic imaging of breast: Secondary | ICD-10-CM | POA: Diagnosis not present

## 2015-04-22 ENCOUNTER — Encounter: Payer: Self-pay | Admitting: *Deleted

## 2015-05-17 ENCOUNTER — Other Ambulatory Visit: Payer: Self-pay | Admitting: Family Medicine

## 2015-06-18 ENCOUNTER — Ambulatory Visit: Payer: Medicare Other | Admitting: Family Medicine

## 2015-06-18 ENCOUNTER — Encounter: Payer: Self-pay | Admitting: Emergency Medicine

## 2015-06-18 ENCOUNTER — Emergency Department
Admission: EM | Admit: 2015-06-18 | Discharge: 2015-06-19 | Disposition: A | Discharge status: Home / Self Care | Payer: Medicare Other | Attending: Emergency Medicine | Admitting: Emergency Medicine

## 2015-06-18 DIAGNOSIS — Z880 Allergy status to penicillin: Secondary | ICD-10-CM | POA: Diagnosis not present

## 2015-06-18 DIAGNOSIS — E119 Type 2 diabetes mellitus without complications: Secondary | ICD-10-CM | POA: Diagnosis not present

## 2015-06-18 DIAGNOSIS — I82532 Chronic embolism and thrombosis of left popliteal vein: Secondary | ICD-10-CM | POA: Diagnosis not present

## 2015-06-18 DIAGNOSIS — I82402 Acute embolism and thrombosis of unspecified deep veins of left lower extremity: Secondary | ICD-10-CM

## 2015-06-18 DIAGNOSIS — R2242 Localized swelling, mass and lump, left lower limb: Secondary | ICD-10-CM | POA: Diagnosis present

## 2015-06-18 DIAGNOSIS — I10 Essential (primary) hypertension: Secondary | ICD-10-CM | POA: Insufficient documentation

## 2015-06-18 DIAGNOSIS — Z79899 Other long term (current) drug therapy: Secondary | ICD-10-CM | POA: Diagnosis not present

## 2015-06-18 DIAGNOSIS — D649 Anemia, unspecified: Secondary | ICD-10-CM | POA: Insufficient documentation

## 2015-06-18 NOTE — ED Notes (Signed)
Pt reports bilateral leg swelling X 2 days, pain upon walking and minimal to moderate SOB intermittently with exertion X 1 day. Pt also reports "lump" in throat that she noticed today when swallowing. Pt states she had leg swelling when she had "heart attack" X approx 3 years ago. Pt alert and oriented X4, active, cooperative, pt in NAD. RR even and unlabored, color WNL.

## 2015-06-18 NOTE — ED Notes (Signed)
Patient with complaint of swelling to bilateral legs that started yesterday. Patient states that she has a history of a dvt.

## 2015-06-19 ENCOUNTER — Emergency Department: Payer: Medicare Other

## 2015-06-19 DIAGNOSIS — I82532 Chronic embolism and thrombosis of left popliteal vein: Secondary | ICD-10-CM | POA: Diagnosis not present

## 2015-06-19 LAB — APTT: aPTT: 32 seconds (ref 24–36)

## 2015-06-19 LAB — CBC
HEMATOCRIT: 34.4 % — AB (ref 35.0–47.0)
Hemoglobin: 11.3 g/dL — ABNORMAL LOW (ref 12.0–16.0)
MCH: 25.8 pg — AB (ref 26.0–34.0)
MCHC: 32.9 g/dL (ref 32.0–36.0)
MCV: 78.3 fL — AB (ref 80.0–100.0)
PLATELETS: 243 10*3/uL (ref 150–440)
RBC: 4.39 MIL/uL (ref 3.80–5.20)
RDW: 17.1 % — ABNORMAL HIGH (ref 11.5–14.5)
WBC: 7.3 10*3/uL (ref 3.6–11.0)

## 2015-06-19 LAB — HEPATIC FUNCTION PANEL
ALT: 17 U/L (ref 14–54)
AST: 18 U/L (ref 15–41)
Albumin: 3.6 g/dL (ref 3.5–5.0)
Alkaline Phosphatase: 104 U/L (ref 38–126)
BILIRUBIN TOTAL: 0.7 mg/dL (ref 0.3–1.2)
Total Protein: 7.8 g/dL (ref 6.5–8.1)

## 2015-06-19 LAB — BASIC METABOLIC PANEL
Anion gap: 9 (ref 5–15)
BUN: 12 mg/dL (ref 6–20)
CO2: 29 mmol/L (ref 22–32)
CREATININE: 0.9 mg/dL (ref 0.44–1.00)
Calcium: 9.1 mg/dL (ref 8.9–10.3)
Chloride: 99 mmol/L — ABNORMAL LOW (ref 101–111)
GFR calc non Af Amer: >60 mL/min (ref >60)
Glucose, Bld: 210 mg/dL — ABNORMAL HIGH (ref 65–99)
POTASSIUM: 3.3 mmol/L — AB (ref 3.5–5.1)
Sodium: 137 mmol/L (ref 135–145)

## 2015-06-19 LAB — LIPASE, BLOOD: Lipase: 41 U/L (ref 11–51)

## 2015-06-19 LAB — TROPONIN I
Troponin I: 0.06 ng/mL — ABNORMAL HIGH (ref <0.031)
Troponin I: 0.06 ng/mL — ABNORMAL HIGH (ref <0.031)

## 2015-06-19 LAB — PROTIME-INR
INR: 1.14
PROTHROMBIN TIME: 14.8 s (ref 11.4–15.0)

## 2015-06-19 MED ORDER — ALUM & MAG HYDROXIDE-SIMETH 200-200-20 MG/5ML PO SUSP
30.0000 mL | ORAL | Status: AC
  Administered 2015-06-19: 30 mL via ORAL
  Filled 2015-06-19: qty 30

## 2015-06-19 MED ORDER — APIXABAN 5 MG PO TABS
10.0000 mg | ORAL_TABLET | ORAL | Status: AC
  Administered 2015-06-19: 10 mg via ORAL
  Filled 2015-06-19: qty 2

## 2015-06-19 MED ORDER — APIXABAN 5 MG PO TABS: ORAL_TABLET | ORAL | Status: DC

## 2015-06-19 NOTE — ED Notes (Signed)
MD at bedside. 

## 2015-06-19 NOTE — ED Notes (Signed)
Patient transported to Ultrasound 

## 2015-06-19 NOTE — ED Notes (Addendum)
Pt continues to be in ultrasound. Family at bedside offered drink, refused at this time. Will continue to await patient to return in room for repeat blood draw.

## 2015-06-19 NOTE — ED Notes (Addendum)
MD at bedside.  Awaiting Eliquis from pharmacy.

## 2015-06-19 NOTE — ED Notes (Signed)
Pt denies taking blood thinners currently. Pt does reports taking 1 baby ASA daily.

## 2015-06-19 NOTE — ED Notes (Signed)
Pt alert and oriented X4, active, cooperative, pt in NAD. RR even and unlabored, color WNL.  Pt informed to return if any life threatening symptoms occur.  Pt ambulatory. 

## 2015-06-19 NOTE — ED Provider Notes (Signed)
[facility] Emergency Department Provider Note  ____________________________________________  Time seen: Approximately 12:55 AM  I have reviewed the triage vital signs and the nursing notes.   HISTORY  Chief Complaint Leg Swelling    HPI Kristen [name] is a 75 y.o. female with a history of myocardial infarction 5 years ago after developing a DVT in her left lower extremity; her understanding is that the blood clot to her heart was caused by the one in her leg.  Regardless, she presents tonight with a report of bilateral leg swelling which is been gradual in onset over the last 2 days with some pain/tenderness when walking and mild to moderate shortness of breath worse with exertion for the last day.  She just complains of a lump on her throat and mild to moderate epigastric burning discomfort.  She states that the symptoms she is having feels similar to when she had her heart attack 5 years ago.  She does not take any anticoagulation other than a daily 81 mg aspirin.  She denies chest pain but does endorse the feeling of a lump in her throat and the epigastric burning.  She has shortness of breath with exertion which is been more noticeable over the last day.  She describes a tightness and swelling in her lower legs.  She denies dysuria, headache, fever/chills, nausea/vomiting, and episodes of diaphoresis..    Past Medical History  Diagnosis Date  . HTN (hypertension)   . HLD (hyperlipidemia)   . Morbid obesity ([place])   . Vertigo     hx. benign postitional postural  . Internal capsule hemorrhage (HCC)     hx of sublacunar infarct involving the right posterior limb of the internal capsule   . Osteoarthritis     knees  . Frequent headaches   . History of pulmonary embolism 2012  . CAD (coronary artery disease)     by CT, per pt h/o MI  . Myocardial infarction [facility]) 2012    per pt. report, states she was treated with medicine, here at [facility]    . Stroke [facility])      still has balance problem on occas. , uses cane but that's mainly for the left knee pain  . Sleep apnea 2011    study done in [place], states that since she lost weight she doesn't use the CPAP any longer & she doesn't have a problem with sleep apnea  . Primary localized osteoarthritis of left knee 09/22/2014  . Diabetes type 2, uncontrolled ([place])     Patient Active Problem List   Diagnosis Date Noted  . Sensorineural hearing loss (SNHL) of both ears 03/29/2015  . Axillary mass 03/10/2015  . Pain of right middle finger 12/22/2014  . Memory deficit 12/04/2014  . Acute left-sided back pain with sciatica 04/01/2014  . Primary osteoarthritis of left knee 01/27/2014  . Advanced care planning/counseling discussion 12/11/2013  . Medicare annual wellness visit, subsequent 12/11/2013  . Health maintenance examination 12/11/2013  . CAD (coronary artery disease)   . Chest pain 07/17/2013  . Hair loss 05/26/2013  . Benign paroxysmal positional vertigo 05/26/2013  . HLD (hyperlipidemia)   . Obesity, Class II, BMI 35-39.9, with comorbidity ([place])   . History of pulmonary embolism   . Osteoarthritis   . Diabetes type 2, uncontrolled ([place])   . DVT of leg (deep venous thrombosis) ([place]) 12/21/2010  . HYPERTENSION, BENIGN 05/13/2009    Past Surgical History  Procedure Laterality Date  . Total knee arthroplasty  Right 1990s  . Partial hysterectomy      for fibroids, ovaries remain  . Foot surgery Right   . Cataract extraction Bilateral 2013  . Eye surgery      /w IOL  . Tonsillectomy    . Tubal ligation    . Total knee arthroplasty Left 09/22/2014    [name], MD    Current Outpatient Rx  Name  Route  Sig  Dispense  Refill  . amLODipine (NORVASC) 5 MG tablet      TAKE 1 TABLET (5 MG TOTAL) BY MOUTH DAILY.   90 tablet   3   . glimepiride (AMARYL) 4 MG tablet   Oral   Take 4 mg by mouth daily with breakfast.         . losartan (COZAAR) 100 MG tablet   Oral   Take 1  tablet (100 mg total) by mouth daily.   90 tablet   3   . nitroGLYCERIN (NITROSTAT) 0.4 MG SL tablet   Sublingual   Place 0.4 mg under the tongue every 5 (five) minutes as needed for chest pain.         . simvastatin (ZOCOR) 20 MG tablet   Oral   Take 1 tablet (20 mg total) by mouth daily.   90 tablet   3   . vitamin B-12 (CYANOCOBALAMIN) 500 MCG tablet   Oral   Take 500 mcg by mouth daily.         Marland Kitchen apixaban (ELIQUIS) 5 MG TABS tablet      Take two tablets (10 mg) by mouth twice daily for 7 days, then take one tablet (5 mg) by mouth twice daily.   70 tablet   0   . baclofen (LIORESAL) 10 MG tablet   Oral   Take 1 tablet (10 mg total) by mouth 3 (three) times daily. As needed for muscle spasm Patient not taking: Reported on 06/19/2015   50 tablet   0   . Blood Glucose Monitoring Suppl (ONE TOUCH ULTRA SYSTEM KIT) W/DEVICE KIT   Does not apply   1 kit by Does not apply route once.   1 each   0   . colchicine 0.6 MG tablet      2 tabs po x 1 then repeat 1 tab in 2 hour, continue 1 tab twice daily until pain resolved Patient not taking: Reported on 06/19/2015   20 tablet   0   . glucose blood test strip      250.02 Use as instructed   30 each   11   . sennosides-docusate sodium (SENOKOT-S) 8.6-50 MG tablet   Oral   Take 2 tablets by mouth daily. Patient not taking: Reported on 06/19/2015   30 tablet   1     Allergies Acetaminophen; Aleve; Metformin and related; and Penicillins  Family History  Problem Relation Age of Onset  . Cancer Mother     bone  . Diabetes Father   . Cancer Son 31    lung  . Congenital heart disease Son 47  . Hypertension Father     Social History Social History  Substance Use Topics  . Smoking status: Never Smoker   . Smokeless tobacco: Never Used     Comment: tobacco use- no   . Alcohol Use: 0.0 oz/week    0 Standard drinks or equivalent per week     Comment: Rare    Review of Systems Constitutional: No  fever/chills Eyes: No visual  changes. ENT: No sore throat.  Feels a "lump" in her throat Cardiovascular: Denies chest pain. Respiratory: Shortness of breath with exertion, worse over the last day Gastrointestinal: No abdominal pain.  No nausea, no vomiting.  No diarrhea.  No constipation. Genitourinary: Negative for dysuria. Musculoskeletal: Negative for back pain.  Reports swelling in both of her lower legs over the last several days  Skin: Negative for rash. Neurological: Negative for headaches, focal weakness or numbness.  10-point ROS otherwise negative.  ____________________________________________   PHYSICAL EXAM:  VITAL SIGNS: ED Triage Vitals  Enc Vitals Group     BP 06/18/15 2319 208/92 mmHg     Pulse Rate 06/18/15 2319 86     Resp 06/18/15 2319 18     Temp 06/18/15 2319 98 F (36.7 C)     Temp src --      SpO2 06/18/15 2319 100 %     Weight 06/18/15 2319 218 lb (98.884 kg)     Height 06/18/15 2319 '5\' 2"'  (1.575 m)     Head Cir --      Peak Flow --      Pain Score 06/18/15 2319 0     Pain Loc --      Pain Edu? --      Excl. in [place]? --     Constitutional: Alert and oriented. Well appearing and in no acute distress. Eyes: Conjunctivae are normal. PERRL. EOMI. Head: Atraumatic. Nose: No congestion/rhinnorhea. Mouth/Throat: Mucous membranes are moist.  Oropharynx non-erythematous. Neck: No stridor.   Cardiovascular: Normal rate, regular rhythm. Grossly normal heart sounds.  Good peripheral circulation. Respiratory: Normal respiratory effort.  No retractions. Lungs CTAB. Gastrointestinal: Soft and nontender. No distention. No abdominal bruits. No CVA tenderness. Musculoskeletal: No lower extremity tenderness nor edema.  No joint effusions.  Neurologic:  Normal speech and language. No gross focal neurologic deficits are appreciated.  Skin:  Skin is warm, dry and intact. No rash noted. Psychiatric: Mood and affect are normal. Speech and behavior are  normal.  ____________________________________________   LABS (all labs ordered are listed, but only abnormal results are displayed)  Labs Reviewed  TROPONIN I - Abnormal; Notable for the following:    Troponin I 0.06 (*)    All other components within normal limits  BASIC METABOLIC PANEL - Abnormal; Notable for the following:    Potassium 3.3 (*)    Chloride 99 (*)    Glucose, Bld 210 (*)    All other components within normal limits  CBC - Abnormal; Notable for the following:    Hemoglobin 11.3 (*)    HCT 34.4 (*)    MCV 78.3 (*)    MCH 25.8 (*)    RDW 17.1 (*)    All other components within normal limits  HEPATIC FUNCTION PANEL - Abnormal; Notable for the following:    Bilirubin, Direct <0.1 (*)    All other components within normal limits  TROPONIN I - Abnormal; Notable for the following:    Troponin I 0.06 (*)    All other components within normal limits  LIPASE, BLOOD  PROTIME-INR  APTT   ____________________________________________  EKG  ED ECG REPORT I, Orestes Geiman, the attending physician, personally viewed and interpreted this ECG.  Date: 06/19/2015 EKG Time: 23:26 Rate: 78 Rhythm: normal sinus rhythm QRS Axis: normal Intervals: normal ST/T Wave abnormalities: normal Conduction Disturbances: none Narrative Interpretation: unremarkable  ____________________________________________  RADIOLOGY   US Venous Img Lower Bilateral  06/19/2015  CLINICAL DATA:  Bilateral leg  swelling since yesterday. History of left lower extremity DVT. EXAM: BILATERAL LOWER EXTREMITY VENOUS DOPPLER ULTRASOUND TECHNIQUE: Gray-scale sonography with graded compression, as well as color Doppler and duplex ultrasound were performed to evaluate the lower extremity deep venous systems from the level of the common femoral vein and including the common femoral, femoral, profunda femoral, popliteal and calf veins including the posterior tibial, peroneal and gastrocnemius veins when  visible. The superficial great saphenous vein was also interrogated. Spectral Doppler was utilized to evaluate flow at rest and with distal augmentation maneuvers in the common femoral, femoral and popliteal veins. COMPARISON:  None. FINDINGS: RIGHT LOWER EXTREMITY Common Femoral Vein: No evidence of thrombus. Normal compressibility, respiratory phasicity and response to augmentation. Saphenofemoral Junction: Not visualized, history of saphenous venous harvesting. Profunda Femoral Vein: No evidence of thrombus. Normal compressibility and flow on color Doppler imaging. Femoral Vein: No evidence of thrombus. Normal compressibility, respiratory phasicity and response to augmentation. Popliteal Vein: No evidence of thrombus. Normal compressibility, respiratory phasicity and response to augmentation. Calf Veins: No evidence of thrombus in the posterior tibial vein. Normal compressibility and flow on color Doppler imaging. Perennial vein not visualized. Superficial Great Saphenous Vein: Not visualized. Venous Reflux:  None. Other Findings:  None. LEFT LOWER EXTREMITY Common Femoral Vein: No evidence of thrombus. Normal compressibility, respiratory phasicity and response to augmentation. Saphenofemoral Junction: No evidence of thrombus. Normal compressibility and flow on color Doppler imaging. Profunda Femoral Vein: No evidence of thrombus. Normal compressibility and flow on color Doppler imaging. Femoral Vein: No evidence of thrombus. Normal compressibility, respiratory phasicity and response to augmentation. Popliteal Vein: Nonocclusive thrombus with lack of compressibility. This appears peripherally located. Calf Veins: No evidence of thrombus. Normal compressibility and flow on color Doppler imaging. Superficial Great Saphenous Vein: No evidence of thrombus. Normal compressibility and flow on color Doppler imaging. Venous Reflux:  None. Other Findings:  None. IMPRESSION: 1. Nonocclusive thrombus in the left popliteal  vein, chronicity uncertain. 2. No evidence of right lower extremity DVT. Critical Value/emergent results were called by telephone at the time of interpretation on 06/19/2015 at 3:09 am to Dr. Hinda Kehr , who verbally acknowledged these results. Electronically Signed   By: Jeb Levering M.D.   On: 06/19/2015 03:10    ____________________________________________   PROCEDURES  Procedure(s) performed: None  Critical Care performed: None ____________________________________________   INITIAL IMPRESSION / ASSESSMENT AND PLAN / ED COURSE  Pertinent labs & imaging results that were available during my care of the patient were reviewed by me and considered in my medical decision making (see chart for details).  Slightly positive troponin 0.06, difficult to interpret, although the patient does have epigastric burning and discomfort, worsening shortness of breath with exertion, and has a history of DVT which apparently led to an NSTEMI.  I will evaluate with lower extremity ultrasounds to look for DVTs although I do not appreciate any significant swelling or edema.  I will discuss disposition plans with her shortly.  ----------------------------------------- 4:00 AM on 06/19/2015 -----------------------------------------  Patient's repeat troponin was unchanged.  She continues to rest comfortably in no acute distress.  She keeps saying that her epigastric discomfort is from her burping and it sounds much more like acid reflux anything else.  She is not having an acute coronary issue at this time.  Her ultrasound does show a DVT but it is difficult to assess if this is acute or chronic.  She is appropriate for outpatient management with Eliquis and close outpatient follow-up.  I stressed  to her how important it is that she follow-up as soon as possible either with her existing doctor in [place] or with Dr. Grayland Ormond with hematology who can help her with her management of the DVT.  She understands  and agrees.  In her written instructions I told her to stop taking her aspirin while she takes the Eliquis and I gave her a starter pack scout packets of that she can get her first month free of charge.    I gave my usual and customary return precautions.    ___________________________________________  FINAL CLINICAL IMPRESSION(S) / ED DIAGNOSES  Final diagnoses:  DVT (deep venous thrombosis), left      NEW MEDICATIONS STARTED DURING THIS VISIT:  New Prescriptions   APIXABAN (ELIQUIS) 5 MG TABS TABLET    Take two tablets (10 mg) by mouth twice daily for 7 days, then take one tablet (5 mg) by mouth twice daily.    Discontinued Medications   ASPIRIN EC 81 MG TABLET    Take 81 mg by mouth daily.   GLIMEPIRIDE (AMARYL) 4 MG TABLET    Take 1 tablet (4 mg total) by mouth daily with breakfast.      Please note that this document was prepared using voice recognition software and may include unintentional dictation errors.   Hinda Kehr, MD 06/19/15 [id number]

## 2015-06-19 NOTE — Discharge Instructions (Signed)
As we discussed, you do have a blood clot in your left leg, but it is not blocking the veins completely and it is difficult to tell how long it has been there.  It is appropriate to go ahead and start on a blood thinning medicine (Eliquis) and heavy follow-up closely with either your existing doctor in [place] or with the blood doctor (hematologist) here in [name].  The information for Dr. Grayland Ormond this provided in this information.  Please follow-up at the next available opportunity and take all of your regular medications as instructed except for your aspirin, which you should stop taking until or unless your doctor tells you otherwise.  If you develop any new or worsening symptoms that concern you, please return immediately to the emergency department.   Deep Vein Thrombosis A deep vein thrombosis (DVT) is a blood clot (thrombus) that usually occurs in a deep, larger vein of the lower leg or the pelvis, or in an upper extremity such as the arm. These are dangerous and can lead to serious and even life-threatening complications if the clot travels to the lungs. A DVT can damage the valves in your leg veins so that instead of flowing upward, the blood pools in the lower leg. This is called post-thrombotic syndrome, and it can result in pain, swelling, discoloration, and sores on the leg. CAUSES A DVT is caused by the formation of a blood clot in your leg, pelvis, or arm. Usually, several things contribute to the formation of blood clots. A clot may develop when:  Your blood flow slows down.  Your vein becomes damaged in some way.  You have a condition that makes your blood clot more easily. RISK FACTORS A DVT is more likely to develop in:  People who are older, especially over 43 years of age.  People who are overweight (obese).  People who sit or lie still for a long time, such as during long-distance travel (over 4 hours), bed rest, hospitalization, or during recovery from certain  medical conditions like a stroke.  People who do not engage in much physical activity (sedentary lifestyle).  People who have chronic breathing disorders.  People who have a personal or family history of blood clots or blood clotting disease.  People who have peripheral vascular disease (PVD), diabetes, or some types of cancer.  People who have heart disease, especially if the person had a recent heart attack or has congestive heart failure.  People who have neurological diseases that affect the legs (leg paresis).  People who have had a traumatic injury, such as breaking a hip or leg.  People who have recently had major or lengthy surgery, especially on the hip, knee, or abdomen.  People who have had a central line placed inside a large vein.  People who take medicines that contain the hormone estrogen. These include birth control pills and hormone replacement therapy.  Pregnancy or during childbirth or the postpartum period.  Long plane flights (over 8 hours). SIGNS AND SYMPTOMS Symptoms of a DVT can include:   Swelling of your leg or arm, especially if one side is much worse.  Warmth and redness of your leg or arm, especially if one side is much worse.  Pain in your arm or leg. If the clot is in your leg, symptoms may be more noticeable or worse when you stand or walk.  A feeling of pins and needles, if the clot is in the arm. The symptoms of a DVT that has traveled to  the lungs (pulmonary embolism, PE) usually start suddenly and include:  Shortness of breath while active or at rest.  Coughing or coughing up blood or blood-tinged mucus.  Chest pain that is often worse with deep breaths.  Rapid or irregular heartbeat.  Feeling light-headed or dizzy.  Fainting.  Feeling anxious.  Sweating. There may also be pain and swelling in a leg if that is where the blood clot started. These symptoms may represent a serious problem that is an emergency. Do not wait to see if  the symptoms will go away. Get medical help right away. Call your local emergency services (911 in the [place] Do not drive yourself to the hospital. DIAGNOSIS Your health care provider will take a medical history and perform a physical exam. You may also have other tests, including:  Blood tests to assess the clotting properties of your blood.  Imaging tests, such as CT, ultrasound, MRI, X-ray, and other tests to see if you have clots anywhere in your body. TREATMENT After a DVT is identified, it can be treated. The type of treatment that you receive depends on many factors, such as the cause of your DVT, your risk for bleeding or developing more clots, and other medical conditions that you have. Sometimes, a combination of treatments is necessary. Treatment options may be combined and include:  Monitoring the blood clot with ultrasound.  Taking medicines by mouth, such as newer blood thinners (anticoagulants), thrombolytics, or warfarin.  Taking anticoagulant medicine by injection or through an IV tube.  Wearing compression stockings or using different types ofdevices.  Surgery (rare) to remove the blood clot or to place a filter in your abdomen to stop the blood clot from traveling to your lungs. Treatments for a DVT are often divided into immediate treatment and long-term treatment (up to 3 months after DVT). You can work with your health care provider to choose the treatment program that is best for you. HOME CARE INSTRUCTIONS If you are taking a newer oral anticoagulant:  Take the medicine every single day at the same time each day.  Understand what foods and drugs interact with this medicine.  Understand that there are no regular blood tests required when using this medicine.  Understand the side effects of this medicine, including excessive bruising or bleeding. Ask your health care provider or pharmacist about other possible side effects. If you are taking  warfarin:  Understand how to take warfarin and know which foods can affect how warfarin works in Veterinary surgeon.  Understand that it is dangerous to take too much or too little warfarin. Too much warfarin increases the risk of bleeding. Too little warfarin continues to allow the risk for blood clots.  Follow your PT and INR blood testing schedule. The PT and INR results allow your health care provider to adjust your dose of warfarin. It is very important that you have your PT and INR tested as often as told by your health care provider.  Avoid major changes in your diet, or tell your health care provider before you change your diet. Arrange a visit with a registered dietitian to answer your questions. Many foods, especially foods that are high in vitamin K, can interfere with warfarin and affect the PT and INR results. Eat a consistent amount of foods that are high in vitamin K, such as:  Spinach, kale, broccoli, cabbage, collard greens, turnip greens, Brussels sprouts, peas, cauliflower, seaweed, and parsley.  Beef liver and pork liver.  Green tea.  Soybean  oil.  Tell your health care provider about any and all medicines, vitamins, and supplements that you take, including aspirin and other over-the-counter anti-inflammatory medicines. Be especially cautious with aspirin and anti-inflammatory medicines. Do not take those before you ask your health care provider if it is safe to do so. This is important because many medicines can interfere with warfarin and affect the PT and INR results.  Do not start or stop taking any over-the-counter or prescription medicine unless your health care provider or pharmacist tells you to do so. If you take warfarin, you will also need to do these things:  Hold pressure over cuts for longer than usual.  Tell your dentist and other health care providers that you are taking warfarin before you have any procedures in which bleeding may occur.  Avoid alcohol or drink  very small amounts. Tell your health care provider if you change your alcohol intake.  Do not use tobacco products, including cigarettes, chewing tobacco, and e-cigarettes. If you need help quitting, ask your health care provider.  Avoid contact sports. General Instructions  Take over-the-counter and prescription medicines only as told by your health care provider. Anticoagulant medicines can have side effects, including easy bruising and difficulty stopping bleeding. If you are prescribed an anticoagulant, you will also need to do these things:  Hold pressure over cuts for longer than usual.  Tell your dentist and other health care providers that you are taking anticoagulants before you have any procedures in which bleeding may occur.  Avoid contact sports.  Wear a medical alert bracelet or carry a medical alert card that says you have had a PE.  Ask your health care provider how soon you can go back to your normal activities. Stay active to prevent new blood clots from forming.  Make sure to exercise while traveling or when you have been sitting or standing for a long period of time. It is very important to exercise. Exercise your legs by walking or by tightening and relaxing your leg muscles often. Take frequent walks.  Wear compression stockings as told by your health care provider to help prevent more blood clots from forming.  Do not use tobacco products, including cigarettes, chewing tobacco, and e-cigarettes. If you need help quitting, ask your health care provider.  Keep all follow-up appointments with your health care provider. This is important. PREVENTION Take these actions to decrease your risk of developing another DVT:  Exercise regularly. For at least 30 minutes every day, engage in:  Activity that involves moving your arms and legs.  Activity that encourages good blood flow through your body by increasing your heart rate.  Exercise your arms and legs every hour  during long-distance travel (over 4 hours). Drink plenty of water and avoid drinking alcohol while traveling.  Avoid sitting or lying in bed for long periods of time without moving your legs.  Maintain a weight that is appropriate for your height. Ask your health care provider what weight is healthy for you.  If you are a woman who is over 50 years of age, avoid unnecessary use of medicines that contain estrogen. These include birth control pills.  Do not smoke, especially if you take estrogen medicines. If you need help quitting, ask your health care provider. If you are hospitalized, prevention measures may include:  Early walking after surgery, as soon as your health care provider says that it is safe.  Receiving anticoagulants to prevent blood clots.If you cannot take anticoagulants, other options may  be available, such as wearing compression stockings or using different types of devices. SEEK IMMEDIATE MEDICAL CARE IF:  You have new or increased pain, swelling, or redness in an arm or leg.  You have numbness or tingling in an arm or leg.  You have shortness of breath while active or at rest.  You have chest pain.  You have a rapid or irregular heartbeat.  You feel light-headed or dizzy.  You cough up blood.  You notice blood in your vomit, bowel movement, or urine. These symptoms may represent a serious problem that is an emergency. Do not wait to see if the symptoms will go away. Get medical help right away. Call your local emergency services (911 in the [place] Do not drive yourself to the hospital.   This information is not intended to replace advice given to you by your health care provider. Make sure you discuss any questions you have with your health care provider.   Document Released: 04/10/2005 Document Revised: 12/30/2014 Document Reviewed: 08/05/2014 Elsevier Interactive Patient Education [employer].

## 2015-06-19 NOTE — ED Notes (Signed)
MD informed of positive troponin 0.06

## 2015-06-21 ENCOUNTER — Other Ambulatory Visit: Payer: Self-pay | Admitting: Family Medicine

## 2015-06-21 ENCOUNTER — Encounter: Payer: Self-pay | Admitting: Family Medicine

## 2015-06-21 ENCOUNTER — Ambulatory Visit (INDEPENDENT_AMBULATORY_CARE_PROVIDER_SITE_OTHER): Payer: Medicare Other | Admitting: Family Medicine

## 2015-06-21 VITALS — BP 148/94 | HR 72 | Temp 98.3°F | Wt 205.5 lb

## 2015-06-21 DIAGNOSIS — I700 Atherosclerosis of aorta: Secondary | ICD-10-CM | POA: Diagnosis not present

## 2015-06-21 DIAGNOSIS — I82432 Acute embolism and thrombosis of left popliteal vein: Secondary | ICD-10-CM | POA: Diagnosis not present

## 2015-06-21 DIAGNOSIS — Z86711 Personal history of pulmonary embolism: Secondary | ICD-10-CM

## 2015-06-21 MED ORDER — APIXABAN 5 MG PO TABS: 5.0000 mg | ORAL_TABLET | Freq: Two times a day (BID) | ORAL | Status: DC
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
## 2015-06-21 NOTE — Patient Instructions (Addendum)
Sign release for records from Dr Cynda Acres at [facility]. No aspirin while you're on eliquis. We will wait and see what blood doctor recommends. Continue eliquis for now - if needed to continue, call # on coupon card to ask about patient assistance.  Cancel May appt, keep March.

## 2015-06-21 NOTE — Assessment & Plan Note (Addendum)
Await records from prior heme eval Dr Cynda Acres - requested today. She did complete 6 mo coumadin after initial PE.

## 2015-06-21 NOTE — Progress Notes (Signed)
Pre visit review using our clinic review tool, if applicable. No additional management support is needed unless otherwise documented below in the visit note. 

## 2015-06-21 NOTE — Assessment & Plan Note (Addendum)
[place] with unclear chronicity L popliteal vein DVT - ?chronic DVT from 2012 (although records mention [place] 2013 WNL). She did have recent L knee TKR but did complete anticoagulant course after this (xarelto).  Will await heme recs (pt to call and schedule appt).  Will continue eliquis for now. Pt instructed to call patient assistance for eliquis.

## 2015-06-22 ENCOUNTER — Telehealth: Payer: Self-pay | Admitting: *Deleted

## 2015-06-22 NOTE — Telephone Encounter (Signed)
PA required for eliquis. Completed through Cover my meds. Will await determination.

## 2015-06-23 NOTE — Telephone Encounter (Signed)
PA approved. Pharmacy notified 

## 2015-06-27 ENCOUNTER — Telehealth: Payer: Self-pay | Admitting: Family Medicine

## 2015-06-27 NOTE — Telephone Encounter (Addendum)
plz call - pt has appt on Tuesday but I think we can cancel this and keep May's? She also has appt with heme on Tuesday  nvm - Tuesday is DM f/u. Will keep that appt.

## 2015-06-29 ENCOUNTER — Encounter: Payer: Self-pay | Admitting: Hematology and Oncology

## 2015-06-29 ENCOUNTER — Inpatient Hospital Stay: Payer: Medicare Other

## 2015-06-29 ENCOUNTER — Ambulatory Visit (INDEPENDENT_AMBULATORY_CARE_PROVIDER_SITE_OTHER): Payer: Medicare Other | Admitting: Family Medicine

## 2015-06-29 ENCOUNTER — Inpatient Hospital Stay: Payer: Medicare Other | Attending: Hematology and Oncology | Admitting: Hematology and Oncology

## 2015-06-29 ENCOUNTER — Encounter: Payer: Self-pay | Admitting: Family Medicine

## 2015-06-29 VITALS — BP 164/82 | HR 73 | Temp 98.2°F | Ht 62.0 in | Wt 204.2 lb

## 2015-06-29 VITALS — BP 197/78 | HR 72 | Temp 97.2°F | Wt 203.9 lb

## 2015-06-29 DIAGNOSIS — E118 Type 2 diabetes mellitus with unspecified complications: Secondary | ICD-10-CM | POA: Diagnosis not present

## 2015-06-29 DIAGNOSIS — E119 Type 2 diabetes mellitus without complications: Secondary | ICD-10-CM

## 2015-06-29 DIAGNOSIS — G4730 Sleep apnea, unspecified: Secondary | ICD-10-CM | POA: Insufficient documentation

## 2015-06-29 DIAGNOSIS — I10 Essential (primary) hypertension: Secondary | ICD-10-CM

## 2015-06-29 DIAGNOSIS — Z8673 Personal history of transient ischemic attack (TIA), and cerebral infarction without residual deficits: Secondary | ICD-10-CM | POA: Diagnosis not present

## 2015-06-29 DIAGNOSIS — I2510 Atherosclerotic heart disease of native coronary artery without angina pectoris: Secondary | ICD-10-CM

## 2015-06-29 DIAGNOSIS — Z79899 Other long term (current) drug therapy: Secondary | ICD-10-CM | POA: Diagnosis not present

## 2015-06-29 DIAGNOSIS — F0390 Unspecified dementia without behavioral disturbance: Secondary | ICD-10-CM | POA: Insufficient documentation

## 2015-06-29 DIAGNOSIS — E1165 Type 2 diabetes mellitus with hyperglycemia: Secondary | ICD-10-CM

## 2015-06-29 DIAGNOSIS — Z86718 Personal history of other venous thrombosis and embolism: Secondary | ICD-10-CM | POA: Insufficient documentation

## 2015-06-29 DIAGNOSIS — M1990 Unspecified osteoarthritis, unspecified site: Secondary | ICD-10-CM | POA: Diagnosis not present

## 2015-06-29 DIAGNOSIS — Z86711 Personal history of pulmonary embolism: Secondary | ICD-10-CM | POA: Diagnosis not present

## 2015-06-29 DIAGNOSIS — E785 Hyperlipidemia, unspecified: Secondary | ICD-10-CM

## 2015-06-29 DIAGNOSIS — I252 Old myocardial infarction: Secondary | ICD-10-CM | POA: Insufficient documentation

## 2015-06-29 DIAGNOSIS — M4804 Spinal stenosis, thoracic region: Secondary | ICD-10-CM | POA: Diagnosis not present

## 2015-06-29 DIAGNOSIS — I82432 Acute embolism and thrombosis of left popliteal vein: Secondary | ICD-10-CM | POA: Diagnosis not present

## 2015-06-29 DIAGNOSIS — Z1211 Encounter for screening for malignant neoplasm of colon: Secondary | ICD-10-CM

## 2015-06-29 DIAGNOSIS — IMO0001 Reserved for inherently not codable concepts without codable children: Secondary | ICD-10-CM

## 2015-06-29 DIAGNOSIS — Z7901 Long term (current) use of anticoagulants: Secondary | ICD-10-CM | POA: Insufficient documentation

## 2015-06-29 DIAGNOSIS — I82402 Acute embolism and thrombosis of unspecified deep veins of left lower extremity: Secondary | ICD-10-CM

## 2015-06-29 DIAGNOSIS — D649 Anemia, unspecified: Secondary | ICD-10-CM

## 2015-06-29 DIAGNOSIS — D509 Iron deficiency anemia, unspecified: Secondary | ICD-10-CM

## 2015-06-29 DIAGNOSIS — IMO0002 Reserved for concepts with insufficient information to code with codable children: Secondary | ICD-10-CM

## 2015-06-29 LAB — POTASSIUM: Potassium: 3.5 mEq/L (ref 3.5–5.1)

## 2015-06-29 LAB — CBC WITH DIFFERENTIAL/PLATELET
Basophils Absolute: 0 10*3/uL (ref 0–0.1)
Basophils Relative: 0 %
Eosinophils Absolute: 0 10*3/uL (ref 0–0.7)
Eosinophils Relative: 1 %
HCT: 35.2 % (ref 35.0–47.0)
Hemoglobin: 11.3 g/dL — ABNORMAL LOW (ref 12.0–16.0)
Lymphocytes Relative: 31 %
Lymphs Abs: 2.4 10*3/uL (ref 1.0–3.6)
MCH: 25.5 pg — ABNORMAL LOW (ref 26.0–34.0)
MCHC: 32.2 g/dL (ref 32.0–36.0)
MCV: 79.1 fL — ABNORMAL LOW (ref 80.0–100.0)
Monocytes Absolute: 0.4 10*3/uL (ref 0.2–0.9)
Monocytes Relative: 5 %
Neutro Abs: 4.9 10*3/uL (ref 1.4–6.5)
Neutrophils Relative %: 63 %
Platelets: 309 10*3/uL (ref 150–440)
RBC: 4.45 MIL/uL (ref 3.80–5.20)
RDW: 17.5 % — ABNORMAL HIGH (ref 11.5–14.5)
WBC: 7.7 10*3/uL (ref 3.6–11.0)

## 2015-06-29 LAB — IRON AND TIBC
Iron: 37 ug/dL (ref 28–170)
Saturation Ratios: 11 % (ref 10.4–31.8)
TIBC: 330 ug/dL (ref 250–450)
UIBC: 293 ug/dL

## 2015-06-29 LAB — HEMOGLOBIN A1C: HEMOGLOBIN A1C: 12.1 % — AB (ref 4.6–6.5)

## 2015-06-29 LAB — FERRITIN: Ferritin: 42 ng/mL (ref 11–307)

## 2015-06-29 MED ORDER — LOSARTAN POTASSIUM-HCTZ 100-12.5 MG PO TABS: 1.0000 | ORAL_TABLET | Freq: Every day | ORAL | Status: DC
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
## 2015-06-29 NOTE — Assessment & Plan Note (Signed)
Chronic, uncontrolled. Check A1c today and if elevated start additional medication. Refer to diabetes education Foot exam today Rec schedule eye exam.

## 2015-06-29 NOTE — Addendum Note (Signed)
Addended by: Royann Shivers A on: 06/29/2015 09:57 AM   Modules accepted: Orders

## 2015-06-29 NOTE — Assessment & Plan Note (Addendum)
Appreciate heme input on length of anticoagulation. Presumed new DVT. Negative prior hypercoagulability panel.  Pt has decreased eliquis to 5mg  daily due to nonspecific blood tinged saliva without cough or congestion.

## 2015-06-29 NOTE — Assessment & Plan Note (Signed)
Discussed healthy diet and lifestyle changes to affect sustainable weight loss  

## 2015-06-29 NOTE — Assessment & Plan Note (Signed)
Chronic, elevated - add hctz to losartan and amlodipine.  BP Readings from Last 3 Encounters:  06/29/15 160/92  06/21/15 148/94  06/19/15 189/69

## 2015-06-29 NOTE — Progress Notes (Signed)
Pre visit review using our clinic review tool, if applicable. No additional management support is needed unless otherwise documented below in the visit note. 

## 2015-06-29 NOTE — Patient Instructions (Addendum)
Schedule diabetic eye exam as you're due.  We will refer you to [facility] for diabetes education. Labwork today. Return as needed or in 4 months for follow up visit.  Stop plain losartan, start losartan- hydrochlorothiazide combo pill.

## 2015-06-29 NOTE — Progress Notes (Signed)
BP 164/82 mmHg  Pulse 73  Temp(Src) 98.2 F (36.8 C) (Oral)  Ht '5\' 2"'  (1.575 m)  Wt 204 lb 4 oz (92.647 kg)  BMI 37.35 kg/m2  SpO2 97%   CC: 4 mo DM f/u visit  Subjective:    Patient ID: Kristen [name], female    DOB: 07/10/1940, 75 y.o.   MRN: [id number]  HPI: Kristen [name] is a 75 y.o. female presenting on 06/29/2015 for Follow-up   DM - regularly does not check sugars. Compliant with antihyperglycemic regimen which includes: glimepiride 50m daily. Denies hypoglycemic symptoms. Denies paresthesias. Last diabetic eye exam: DUE.  Pneumovax: 2016.  Prevnar: 2015. Prior  Lab Results  Component Value Date   HGBA1C 9.3* 03/10/2015   Diabetic Foot Exam - Simple   Simple Foot Form  Diabetic Foot exam was performed with the following findings:  Yes 06/29/2015  9:37 AM  Visual Inspection  No deformities, no ulcerations, no other skin breakdown bilaterally:  Yes  Sensation Testing  Intact to touch and monofilament testing bilaterally:  Yes  Pulse Check  Posterior Tibialis and Dorsalis pulse intact bilaterally:  Yes  Comments  Mild callus R great toe     HLD - compliant with simvastatin without myalgias.  HTN - Compliant with current antihypertensive regimen of amlodipine 559mand losartan 1007maily. Does not check blood pressures at home. No low blood pressure readings or symptoms of dizziness/syncope. Denies HA, vision changes, CP/tightness, SOB, leg swelling.    [facility] with LLE DVT (nonocclusive thrombus of L popliteal vein, chronicity uncertain) started on eliquis 5mg19md. She has only been taking 5mg 49me daily because of some blood tinged saliva which resolved. No cough, no sinus congestion.   Prior h/o LLE DVT/PE with MI 2012 s/p 6 months coumadin. She did see hematologist Dr GitteCynda Acrestual workup unavailable but per records was unrevealing. She also had LE doppler per chart records which was reported as normal on a discharge summary but I don't  have actual report either.   She did have L TKR 09/2014, recovered well from this.   Relevant past medical, surgical, family and social history reviewed and updated as indicated. Interim medical history since our last visit reviewed. Allergies and medications reviewed and updated. Current Outpatient Prescriptions on File Prior to Visit  Medication Sig  . amLODipine (NORVASC) 5 MG tablet TAKE 1 TABLET (5 MG TOTAL) BY MOUTH DAILY.  . apiMarland Kitchenaban (ELIQUIS) 5 MG TABS tablet Take 1 tablet (5 mg total) by mouth 2 (two) times daily.  . baclofen (LIORESAL) 10 MG tablet Take 1 tablet (10 mg total) by mouth 3 (three) times daily. As needed for muscle spasm  . Blood Glucose Monitoring Suppl (ONE TOUCH ULTRA SYSTEM KIT) W/DEVICE KIT 1 kit by Does not apply route once.  . colchicine 0.6 MG tablet 2 tabs po x 1 then repeat 1 tab in 2 hour, continue 1 tab twice daily until pain resolved  . glimepiride (AMARYL) 4 MG tablet Take 4 mg by mouth daily with breakfast.  . glucose blood test strip 250.02 Use as instructed  . losartan (COZAAR) 100 MG tablet Take 1 tablet (100 mg total) by mouth daily.  . nitroGLYCERIN (NITROSTAT) 0.4 MG SL tablet Place 0.4 mg under the tongue every 5 (five) minutes as needed for chest pain.  . senMarland Kitchenosides-docusate sodium (SENOKOT-S) 8.6-50 MG tablet Take 2 tablets by mouth daily.  . simvastatin (ZOCOR) 20 MG tablet Take 1  tablet (20 mg total) by mouth daily.  . vitamin B-12 (CYANOCOBALAMIN) 500 MCG tablet Take 500 mcg by mouth daily.   No current facility-administered medications on file prior to visit.    Review of Systems Per HPI unless specifically indicated in ROS section     Objective:    BP 164/82 mmHg  Pulse 73  Temp(Src) 98.2 F (36.8 C) (Oral)  Ht '5\' 2"'  (1.575 m)  Wt 204 lb 4 oz (92.647 kg)  BMI 37.35 kg/m2  SpO2 97%  Wt Readings from Last 3 Encounters:  06/29/15 204 lb 4 oz (92.647 kg)  06/21/15 205 lb 8 oz (93.214 kg)  06/18/15 218 lb (98.884 kg)   Body mass  index is 37.35 kg/(m^2).  Physical Exam  Constitutional: She appears well-developed and well-nourished. No distress.  HENT:  Head: Normocephalic and atraumatic.  Right Ear: External ear normal.  Left Ear: External ear normal.  Nose: Nose normal.  Mouth/Throat: Oropharynx is clear and moist. No oropharyngeal exudate.  Eyes: Conjunctivae and EOM are normal. Pupils are equal, round, and reactive to light. No scleral icterus.  Neck: Normal range of motion. Neck supple.  Cardiovascular: Normal rate, regular rhythm, normal heart sounds and intact distal pulses.   No murmur heard. Pulmonary/Chest: Effort normal and breath sounds normal. No respiratory distress. She has no wheezes. She has no rales.  Musculoskeletal: She exhibits no edema.  See HPI for foot exam if done  Lymphadenopathy:    She has no cervical adenopathy.  Skin: Skin is warm and dry. No rash noted.  Psychiatric: She has a normal mood and affect.  Nursing note and vitals reviewed.  Results for orders placed or performed during the hospital encounter of 06/18/15  Troponin I  Result Value Ref Range   Troponin I 0.06 (H) <0.031 ng/mL  Basic metabolic panel  Result Value Ref Range   Sodium 137 135 - 145 mmol/L   Potassium 3.3 (L) 3.5 - 5.1 mmol/L   Chloride 99 (L) 101 - 111 mmol/L   CO2 29 22 - 32 mmol/L   Glucose, Bld 210 (H) 65 - 99 mg/dL   BUN 12 6 - 20 mg/dL   Creatinine, Ser 0.90 0.44 - 1.00 mg/dL   Calcium 9.1 8.9 - 10.3 mg/dL   GFR calc non Af Amer >60 >60 mL/min   GFR calc Af Amer >60 >60 mL/min   Anion gap 9 5 - 15  CBC  Result Value Ref Range   WBC 7.3 3.6 - 11.0 K/uL   RBC 4.39 3.80 - 5.20 MIL/uL   Hemoglobin 11.3 (L) 12.0 - 16.0 g/dL   HCT 34.4 (L) 35.0 - 47.0 %   MCV 78.3 (L) 80.0 - 100.0 fL   MCH 25.8 (L) 26.0 - 34.0 pg   MCHC 32.9 32.0 - 36.0 g/dL   RDW 17.1 (H) 11.5 - 14.5 %   Platelets 243 150 - 440 K/uL  Hepatic function panel  Result Value Ref Range   Total Protein 7.8 6.5 - 8.1 g/dL    Albumin 3.6 3.5 - 5.0 g/dL   AST 18 15 - 41 U/L   ALT 17 14 - 54 U/L   Alkaline Phosphatase 104 38 - 126 U/L   Total Bilirubin 0.7 0.3 - 1.2 mg/dL   Bilirubin, Direct <0.1 (L) 0.1 - 0.5 mg/dL   Indirect Bilirubin NOT CALCULATED 0.3 - 0.9 mg/dL  Lipase, blood  Result Value Ref Range   Lipase 41 11 - 51 U/L  Troponin I  Result Value Ref Range   Troponin I 0.06 (H) <0.031 ng/mL  Protime-INR  Result Value Ref Range   Prothrombin Time 14.8 11.4 - 15.0 seconds   INR 1.14   APTT  Result Value Ref Range   aPTT 32 24 - 36 seconds      Assessment & Plan:   Problem List Items Addressed This Visit    Obesity, Class II, BMI 35-39.9, with comorbidity (HCC)    Discussed healthy diet and lifestyle changes to affect sustainable weight loss      Relevant Orders   Ambulatory referral to diabetic education   HYPERTENSION, BENIGN    Chronic, elevated - add hctz to losartan and amlodipine.  BP Readings from Last 3 Encounters:  06/29/15 160/92  06/21/15 148/94  06/19/15 189/69        Relevant Medications   losartan-hydrochlorothiazide (HYZAAR) 100-12.5 MG tablet   Other Relevant Orders   Potassium   Ambulatory referral to diabetic education   HLD (hyperlipidemia)    Chronic, stable. Continue simvastatin.      Relevant Medications   losartan-hydrochlorothiazide (HYZAAR) 100-12.5 MG tablet   Other Relevant Orders   Ambulatory referral to diabetic education   DVT of leg (deep venous thrombosis) (HCC)    Appreciate heme input on length of anticoagulation. Presumed new DVT. Negative prior hypercoagulability panel.  Pt has decreased eliquis to 68m daily due to nonspecific blood tinged saliva without cough or congestion.      Relevant Medications   losartan-hydrochlorothiazide (HYZAAR) 100-12.5 MG tablet   Diabetes type 2, uncontrolled (HCC) - Primary    Chronic, uncontrolled. Check A1c today and if elevated start additional medication. Refer to diabetes education Foot exam  today Rec schedule eye exam.      Relevant Medications   losartan-hydrochlorothiazide (HYZAAR) 100-12.5 MG tablet   Other Relevant Orders   Hemoglobin A1c   Ambulatory referral to diabetic education       Follow up plan: Return in about 4 months (around 10/29/2015), or as needed, for follow up visit.

## 2015-06-29 NOTE — Assessment & Plan Note (Signed)
Chronic, stable. Continue simvastatin.  

## 2015-07-01 ENCOUNTER — Other Ambulatory Visit: Payer: Self-pay | Admitting: Family Medicine

## 2015-07-01 MED ORDER — SITAGLIPTIN PHOSPHATE 100 MG PO TABS: 100.0000 mg | ORAL_TABLET | Freq: Every day | ORAL | Status: DC

## 2015-07-05 LAB — CARDIOLIPIN ANTIBODIES, IGG, IGM, IGA
Anticardiolipin IgA: <9 APL U/mL (ref 0–11)
Anticardiolipin IgG: <9 GPL U/mL (ref 0–14)
Anticardiolipin IgM: 11 MPL U/mL (ref 0–12)

## 2015-07-05 LAB — BETA-2-GLYCOPROTEIN I ABS, IGG/M/A
Beta-2 Glyco I IgG: <9 GPI IgG units (ref 0–20)
Beta-2-Glycoprotein I IgA: <9 GPI IgA units (ref 0–25)
Beta-2-Glycoprotein I IgM: <9 GPI IgM units (ref 0–32)

## 2015-07-05 LAB — FACTOR 5 LEIDEN

## 2015-07-06 ENCOUNTER — Inpatient Hospital Stay (HOSPITAL_BASED_OUTPATIENT_CLINIC_OR_DEPARTMENT_OTHER): Payer: Medicare Other | Admitting: Hematology and Oncology

## 2015-07-06 ENCOUNTER — Encounter: Payer: Self-pay | Admitting: Hematology and Oncology

## 2015-07-06 VITALS — BP 170/83 | HR 67 | Temp 97.7°F | Resp 18 | Wt 202.8 lb

## 2015-07-06 DIAGNOSIS — Z8673 Personal history of transient ischemic attack (TIA), and cerebral infarction without residual deficits: Secondary | ICD-10-CM | POA: Diagnosis not present

## 2015-07-06 DIAGNOSIS — M1990 Unspecified osteoarthritis, unspecified site: Secondary | ICD-10-CM | POA: Diagnosis not present

## 2015-07-06 DIAGNOSIS — E785 Hyperlipidemia, unspecified: Secondary | ICD-10-CM | POA: Diagnosis not present

## 2015-07-06 DIAGNOSIS — G4730 Sleep apnea, unspecified: Secondary | ICD-10-CM | POA: Diagnosis not present

## 2015-07-06 DIAGNOSIS — Z7901 Long term (current) use of anticoagulants: Secondary | ICD-10-CM

## 2015-07-06 DIAGNOSIS — Z86711 Personal history of pulmonary embolism: Secondary | ICD-10-CM | POA: Diagnosis not present

## 2015-07-06 DIAGNOSIS — M4804 Spinal stenosis, thoracic region: Secondary | ICD-10-CM | POA: Diagnosis not present

## 2015-07-06 DIAGNOSIS — F0390 Unspecified dementia without behavioral disturbance: Secondary | ICD-10-CM

## 2015-07-06 DIAGNOSIS — Z79899 Other long term (current) drug therapy: Secondary | ICD-10-CM | POA: Diagnosis not present

## 2015-07-06 DIAGNOSIS — I2510 Atherosclerotic heart disease of native coronary artery without angina pectoris: Secondary | ICD-10-CM

## 2015-07-06 DIAGNOSIS — D509 Iron deficiency anemia, unspecified: Secondary | ICD-10-CM | POA: Diagnosis not present

## 2015-07-06 DIAGNOSIS — I10 Essential (primary) hypertension: Secondary | ICD-10-CM | POA: Diagnosis not present

## 2015-07-06 DIAGNOSIS — Z86718 Personal history of other venous thrombosis and embolism: Secondary | ICD-10-CM | POA: Diagnosis not present

## 2015-07-06 DIAGNOSIS — E119 Type 2 diabetes mellitus without complications: Secondary | ICD-10-CM | POA: Diagnosis not present

## 2015-07-06 DIAGNOSIS — I824Z2 Acute embolism and thrombosis of unspecified deep veins of left distal lower extremity: Secondary | ICD-10-CM

## 2015-07-06 DIAGNOSIS — I252 Old myocardial infarction: Secondary | ICD-10-CM | POA: Diagnosis not present
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
## 2015-08-25 ENCOUNTER — Ambulatory Visit: Payer: Medicare Other | Admitting: Cardiovascular Disease

## 2015-08-25 ENCOUNTER — Encounter: Payer: Self-pay | Admitting: *Deleted

## 2015-09-07 ENCOUNTER — Ambulatory Visit: Payer: Medicare Other | Admitting: Family Medicine

## 2015-09-17 ENCOUNTER — Encounter: Payer: Self-pay | Admitting: Family Medicine

## 2015-09-17 ENCOUNTER — Telehealth: Payer: Self-pay

## 2015-09-17 ENCOUNTER — Ambulatory Visit (INDEPENDENT_AMBULATORY_CARE_PROVIDER_SITE_OTHER): Payer: Medicare Other | Admitting: Family Medicine

## 2015-09-17 VITALS — BP 126/84 | HR 76 | Temp 98.1°F | Wt 203.2 lb

## 2015-09-17 DIAGNOSIS — E1165 Type 2 diabetes mellitus with hyperglycemia: Secondary | ICD-10-CM

## 2015-09-17 DIAGNOSIS — E785 Hyperlipidemia, unspecified: Secondary | ICD-10-CM | POA: Diagnosis not present

## 2015-09-17 DIAGNOSIS — M1712 Unilateral primary osteoarthritis, left knee: Secondary | ICD-10-CM

## 2015-09-17 DIAGNOSIS — E118 Type 2 diabetes mellitus with unspecified complications: Secondary | ICD-10-CM | POA: Diagnosis not present

## 2015-09-17 DIAGNOSIS — I824Z2 Acute embolism and thrombosis of unspecified deep veins of left distal lower extremity: Secondary | ICD-10-CM | POA: Diagnosis not present

## 2015-09-17 DIAGNOSIS — I10 Essential (primary) hypertension: Secondary | ICD-10-CM | POA: Diagnosis not present

## 2015-09-17 DIAGNOSIS — IMO0002 Reserved for concepts with insufficient information to code with codable children: Secondary | ICD-10-CM

## 2015-09-17 MED ORDER — GLUCOSE BLOOD VI STRP: ORAL_STRIP | Status: DC

## 2015-09-17 MED ORDER — ONETOUCH ULTRA SYSTEM W/DEVICE KIT: 1.0000 | PACK | Freq: Once | Status: DC

## 2015-09-17 MED ORDER — ONETOUCH DELICA LANCETS 33G MISC: Status: DC

## 2015-09-17 MED ORDER — RIVAROXABAN 20 MG PO TABS: 20.0000 mg | ORAL_TABLET | Freq: Every day | ORAL | Status: DC

## 2015-09-17 NOTE — Assessment & Plan Note (Signed)
Requests handicap placard refilled today for inability to walk 268ft without stopping to rest, use of cane.

## 2015-09-17 NOTE — Telephone Encounter (Signed)
[facility] left requesting resend test strips and lancets with specific instructions and Dx code. Dr Darnell Level said ck BS bid and as directed. Resent as instructed.

## 2015-09-17 NOTE — Assessment & Plan Note (Signed)
Discussed need to continue daily AC. Discussed pt should price out xarelto vs eliquis and fill cheaper alternative.

## 2015-09-17 NOTE — Assessment & Plan Note (Signed)
Chronic, stable. Continue simvastatin.  

## 2015-09-17 NOTE — Progress Notes (Signed)
BP 126/84 mmHg  Pulse 76  Temp(Src) 98.1 F (36.7 C) (Oral)  Wt 203 lb 4 oz (92.194 kg)   CC: f/u visit  Subjective:    Patient ID: Kristen [name], female    DOB: 1940-12-16, 75 y.o.   MRN: [id number]  HPI: Kristen [name] is a 75 y.o. female presenting on 09/17/2015 for Follow-up   Main concern today is lower back. Feels popping when laying in bed. No shooting pain down legs or numbness/weakness of legs. No fevers. No bowel/bladder changes. Requests handicap placard refill.   DVT - suffered 05/2015. Started on eliquis (only taking once daily), seen by heme Dr Mike Gip. rec lifelong anticoagulation as recent DVT likely new (h/o DVT/PE 2012).   DM - regularly does not check sugars. Compliant with antihyperglycemic regimen which includes: glimepiride 4mg  daily. Last visit we started [place] 100mg  daily due to uncontrolled sugars. Denies hypoglycemic symptoms. Denies paresthesias. Last diabetic eye exam: DUE. Pneumovax: 2016. Prevnar: 2015. Prior  Lab Results  Component Value Date   HGBA1C 12.1* 06/29/2015    Diabetic Foot Exam - Simple   No data filed    not done  HLD - compliant with simvastatin without myalgias  HTN - Compliant with current antihypertensive regimen of amlodipine 5mg  and hyzaar 100/12.5mg  daily. Does not check blood pressures at home. No low blood pressure readings or symptoms of dizziness/syncope. Denies HA, vision changes, CP/tightness, SOB, leg swelling  Relevant past medical, surgical, family and social history reviewed and updated as indicated. Interim medical history since our last visit reviewed. Allergies and medications reviewed and updated. Current Outpatient Prescriptions on File Prior to Visit  Medication Sig  . amLODipine (NORVASC) 5 MG tablet TAKE 1 TABLET (5 MG TOTAL) BY MOUTH DAILY.  Marland Kitchen apixaban (ELIQUIS) 5 MG TABS tablet Take 1 tablet (5 mg total) by mouth 2 (two) times daily.  . baclofen (LIORESAL) 10 MG tablet Take 1 tablet (10 mg total) by  mouth 3 (three) times daily. As needed for muscle spasm  . colchicine 0.6 MG tablet 2 tabs po x 1 then repeat 1 tab in 2 hour, continue 1 tab twice daily until pain resolved  . glimepiride (AMARYL) 4 MG tablet Take 4 mg by mouth daily with breakfast.  . losartan-hydrochlorothiazide (HYZAAR) 100-12.5 MG tablet Take 1 tablet by mouth daily.  . nitroGLYCERIN (NITROSTAT) 0.4 MG SL tablet Place 0.4 mg under the tongue every 5 (five) minutes as needed for chest pain.  . sitaGLIPtin (JANUVIA) 100 MG tablet Take 1 tablet (100 mg total) by mouth daily.   No current facility-administered medications on file prior to visit.    Review of Systems Per HPI unless specifically indicated in ROS section     Objective:    BP 126/84 mmHg  Pulse 76  Temp(Src) 98.1 F (36.7 C) (Oral)  Wt 203 lb 4 oz (92.194 kg)  Wt Readings from Last 3 Encounters:  09/17/15 203 lb 4 oz (92.194 kg)  07/06/15 202 lb 13.2 oz (92 kg)  06/29/15 203 lb 14.8 oz (92.5 kg)    Physical Exam  Constitutional: She appears well-developed and well-nourished. No distress.  No cane today Unsteady with getting on exam table.  HENT:  Mouth/Throat: Oropharynx is clear and moist. No oropharyngeal exudate.  Eyes: Conjunctivae and EOM are normal. Pupils are equal, round, and reactive to light. No scleral icterus.  Neck: Normal range of motion. Neck supple.  Cardiovascular: Normal rate, regular rhythm, normal heart sounds and intact distal pulses.  No murmur heard. Pulmonary/Chest: Effort normal and breath sounds normal. No respiratory distress. She has no wheezes. She has no rales.  Musculoskeletal: She exhibits no edema.  Skin: Skin is warm and dry. No rash noted.  Psychiatric: She has a normal mood and affect.  Nursing note and vitals reviewed.  Lab Results  Component Value Date   CREATININE 0.90 06/18/2015       Assessment & Plan:   Problem List Items Addressed This Visit    HYPERTENSION, BENIGN    Chronic, improved  control. Pt was unsure which med she was taking but I verified she last filled hyzaar at pharmacy.      Relevant Medications   rivaroxaban (XARELTO) 20 MG TABS tablet   DVT of leg (deep venous thrombosis) ([place])    Discussed need to continue daily AC. Discussed pt should price out xarelto vs eliquis and fill cheaper alternative.      Relevant Medications   rivaroxaban (XARELTO) 20 MG TABS tablet   HLD (hyperlipidemia)    Chronic, stable. Continue simvastatin.      Relevant Medications   rivaroxaban (XARELTO) 20 MG TABS tablet   Osteoarthritis    Requests handicap placard refilled today for inability to walk 248ft without stopping to rest, use of cane.      Diabetes type 2, uncontrolled (HCC) - Primary    Chronic, has not been checking sugars. I've sent in one-touch glucose meter to pharmacy. States DSME not covered by her insurance, unaffordable. States [place] tolerated well but unaffordable - advised check with insurance for cheaper alternatives on formulary to [place]. Too early to repeat A1c.           Follow up plan: Return in about 3 months (around 12/18/2015), or as needed, for follow up visit.  Kristen Bush, MD

## 2015-09-17 NOTE — Progress Notes (Signed)
Pre visit review using our clinic review tool, if applicable. No additional management support is needed unless otherwise documented below in the visit note. 

## 2015-09-17 NOTE — Assessment & Plan Note (Signed)
Chronic, improved control. Pt was unsure which med she was taking but I verified she last filled hyzaar at pharmacy.

## 2015-09-17 NOTE — Patient Instructions (Addendum)
You do need a daily blood clot medicine - price out xarelto once daily. If too expensive, continue eliquis 5mg  twice daily Call insurance to find if there's a cheaper alternative on their formulary for januvia diabetes medicine.  I have sent in a glucose meter for you. Bring it in for nurse visit for teach.  Continue losartan/hctz 100/12.5mg  daily and amlodipine for better blood pressure control.  Good to see you today, call us with questions. Call to schedule eye exam as you're due. Ask up front who we sent you last for eye exam.  Return in 3 months for follow up visit.

## 2015-09-17 NOTE — Assessment & Plan Note (Signed)
Chronic, has not been checking sugars. I've sent in one-touch glucose meter to pharmacy. States DSME not covered by her insurance, unaffordable. States [place] tolerated well but unaffordable - advised check with insurance for cheaper alternatives on formulary to [place]. Too early to repeat A1c.

## 2015-09-27 ENCOUNTER — Telehealth: Payer: Self-pay | Admitting: *Deleted

## 2015-09-27 NOTE — Telephone Encounter (Signed)
Xarelto required PA. Completed and approved via Cover My Meds.

## 2015-09-30 NOTE — Telephone Encounter (Signed)
Xarelto approved. Pharmacy notified.

## 2015-10-13 ENCOUNTER — Encounter (INDEPENDENT_AMBULATORY_CARE_PROVIDER_SITE_OTHER): Payer: Self-pay

## 2015-10-13 ENCOUNTER — Ambulatory Visit (INDEPENDENT_AMBULATORY_CARE_PROVIDER_SITE_OTHER): Payer: Medicare Other | Admitting: Cardiovascular Disease

## 2015-10-13 ENCOUNTER — Encounter: Payer: Self-pay | Admitting: Cardiovascular Disease

## 2015-10-13 ENCOUNTER — Telehealth: Payer: Self-pay | Admitting: Cardiovascular Disease

## 2015-10-13 VITALS — BP 142/70 | HR 74 | Ht 62.0 in | Wt 210.1 lb

## 2015-10-13 DIAGNOSIS — E118 Type 2 diabetes mellitus with unspecified complications: Secondary | ICD-10-CM

## 2015-10-13 DIAGNOSIS — E1165 Type 2 diabetes mellitus with hyperglycemia: Secondary | ICD-10-CM

## 2015-10-13 DIAGNOSIS — I2510 Atherosclerotic heart disease of native coronary artery without angina pectoris: Secondary | ICD-10-CM

## 2015-10-13 DIAGNOSIS — I824Z2 Acute embolism and thrombosis of unspecified deep veins of left distal lower extremity: Secondary | ICD-10-CM | POA: Diagnosis not present

## 2015-10-13 DIAGNOSIS — I10 Essential (primary) hypertension: Secondary | ICD-10-CM | POA: Diagnosis not present

## 2015-10-13 DIAGNOSIS — IMO0001 Reserved for inherently not codable concepts without codable children: Secondary | ICD-10-CM

## 2015-10-13 DIAGNOSIS — IMO0002 Reserved for concepts with insufficient information to code with codable children: Secondary | ICD-10-CM

## 2015-10-13 DIAGNOSIS — E785 Hyperlipidemia, unspecified: Secondary | ICD-10-CM

## 2015-10-13 MED ORDER — WARFARIN SODIUM 2.5 MG PO TABS: 2.5000 mg | ORAL_TABLET | Freq: Every day | ORAL | Status: DC

## 2015-10-13 NOTE — Patient Instructions (Addendum)
Please take eliquis 2 pills a day until you run out  Then Please start coumadin/warfarin  2.5 mg daily,  1/2 pill on Monday, Wednesday, Friday  New coumadin clinic appt in two weeks  We will check a HBA1C today  Please call us if you have new issues that need to be addressed before your next appt.  Your physician wants you to follow-up in: 6 months.  You will receive a reminder letter in the mail two months in advance. If you don't receive a letter, please call our office to schedule the follow-up appointment.

## 2015-10-13 NOTE — Telephone Encounter (Signed)
Per scheduling, no coumadin clinic in [place] on July 5 which is the 2-week check instructed by Dr. Rockey Situ as Eliquis changed to coumadin at today's OV. S/w Danae Chen who states pt needs to start coumadin today in conjunction with eliquis x 2-3 days (whichever amount pt has at home). She will need INR check in one week. Scheduled June 28, 3:20pm  Informed Dr. Rockey Situ of Erica's recommendations. He is agreeable w/plan. Left message on machine for patient to contact the office.

## 2015-10-13 NOTE — Progress Notes (Signed)
Patient ID: Kristen [name], female   DOB: 1940/07/28, 75 y.o.   MRN: [id number] Cardiology Office Note  Date:  10/13/2015   ID:  Kristen [name], DOB 19-Feb-1941, MRN [id number]  PCP:  Kristen Bush, MD   Chief Complaint  Patient presents with  . other    1 yr f/u. Pt did not bring medication list and is not sure about medications. Pt has both xarelto and eliquis on medlist I could not find where pt was instructed to take either or.  Meds reviewed verbally.    HPI:  Kristen [name] is a pleasant 75 year-old woman with history of diabetes, hyperlipidemia, hypertension recently presented to the hospital 07/02/2013 with chest pain. history of obesity. She presents today for follow-up of her hypertension and previous history of chest pain  In follow-up today, she is confused about her blood thinners and other medications  We did call the pharmacy, she is not taking zarelto. It is on hold for her but will cost $45 Your right arm out the    In follow-up she reports that she is feeling well. She reports blood pressures typically well controlled. Her biggest complaint is her knee. She has had total knee replacement on 1 knee, recent cortisone shot on the other knee. She has been seen [facility] orthopedics in preparation for totally replacement She denies any chest pain or shortness of breath concerning for angina Tolerating her medications without any side effects  EKG on today's visit shows T wave abnormality V3 through V6, unchanged from prior EKG  Other past medical history On her previous admission, Chest pain in the hospital was negative for MI, cardiac enzymes negative. 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 This also showed coronary calcifications Stress test showed no ischemia, ejection fraction 61% Lower extremities ultrasound  showed no DVT  PMH:   has a past medical history of HTN (hypertension); HLD (hyperlipidemia); Morbid obesity ([place]); Vertigo; Internal capsule hemorrhage ([place]); Osteoarthritis; Frequent headaches; History of pulmonary embolism (2012); CAD (coronary artery disease); Myocardial infarction [facility]) (2012); Stroke [facility]); Sleep apnea (2011); Primary localized osteoarthritis of left knee (09/22/2014); Diabetes type 2, uncontrolled ([place]); and Abdominal aortic atherosclerosis ([place]) (by CT 02/2014).  PSH:    Past Surgical History  Procedure Laterality Date  . Total knee arthroplasty Right 1990s  . Partial hysterectomy      for fibroids, ovaries remain  . Foot surgery Right   . Cataract extraction Bilateral 2013  . Eye surgery      /w IOL  . Tonsillectomy    . Tubal ligation    . Total knee arthroplasty Left 09/22/2014    [name], MD    Current Outpatient Prescriptions  Medication Sig Dispense Refill  . amLODipine (NORVASC) 5 MG tablet TAKE 1 TABLET (5 MG TOTAL) BY MOUTH DAILY. 90 tablet 3  . baclofen (LIORESAL) 10 MG tablet Take 1 tablet (10 mg total) by mouth 3 (three) times daily. As needed for muscle spasm 50 tablet 0  . Blood Glucose Monitoring Suppl (ONE TOUCH ULTRA SYSTEM KIT) w/Device KIT 1 kit by Does not apply route once. 1 each 0  . colchicine 0.6 MG tablet 2 tabs po x 1 then repeat 1 tab in 2 hour, continue 1 tab twice daily until pain resolved 20 tablet 0  . glimepiride (AMARYL) 4 MG tablet Take 4 mg by mouth  daily with breakfast.    . glucose blood test strip Ck blood sugar twice a day and as directed. 100 each 3  . losartan-hydrochlorothiazide (HYZAAR) 100-12.5 MG tablet Take 1 tablet by mouth daily. 90 tablet 3  . nitroGLYCERIN (NITROSTAT) 0.4 MG SL tablet Place 0.4 mg under the tongue every 5 (five) minutes as needed for chest pain.    [name] DELICA LANCETS 76H MISC Check blood sugar twice a day and as directed. 100 each 3  . sitaGLIPtin (JANUVIA) 100 MG tablet Take 1 tablet  (100 mg total) by mouth daily. 30 tablet 11  . warfarin (COUMADIN) 2.5 MG tablet Take 1 tablet (2.5 mg total) by mouth daily. 30 tablet 1   No current facility-administered medications for this visit.     Allergies:   Acetaminophen; Aleve; Metformin and related; and Penicillins   Social History:  The patient  reports that she has never smoked. She has never used smokeless tobacco. She reports that she drinks alcohol. She reports that she does not use illicit drugs.   Family History:   family history includes Cancer in her mother; Cancer (age of onset: 21) in her son; Congenital heart disease (age of onset: 21) in her son; Diabetes in her father; Hypertension in her father.    Review of Systems: Review of Systems  Constitutional: Negative.   Respiratory: Negative.   Cardiovascular: Negative.   Gastrointestinal: Negative.   Musculoskeletal: Negative.   Neurological: Negative.   Psychiatric/Behavioral: Negative.   All other systems reviewed and are negative.    PHYSICAL EXAM: VS:  BP 142/70 mmHg  Pulse 74  Ht '5\' 2"'  (1.575 m)  Wt 210 lb 2 oz (95.312 kg)  BMI 38.42 kg/m2 , BMI Body mass index is 38.42 kg/(m^2). GEN: Well nourished, well developed, in no acute distress, obese HEENT: normal Neck: no JVD, carotid bruits, or masses Cardiac: RRR; no murmurs, rubs, or gallops,no edema  Respiratory:  clear to auscultation bilaterally, normal work of breathing GI: soft, nontender, nondistended, + BS MS: no deformity or atrophy Skin: warm and dry, no rash Neuro:  Strength and sensation are intact Psych: euthymic mood, full affect    Recent Labs: 03/10/2015: TSH 3.18 06/18/2015: ALT 17; BUN 12; Creatinine, Ser 0.90; Sodium 137 06/29/2015: Hemoglobin 11.3*; Platelets 309; Potassium 3.5    Lipid Panel Lab Results  Component Value Date   CHOL 134 03/10/2015   HDL 36.80* 03/10/2015   LDLCALC 78 03/10/2015   TRIG 97.0 03/10/2015      Wt Readings from Last 3 Encounters:   10/13/15 210 lb 2 oz (95.312 kg)  09/17/15 203 lb 4 oz (92.194 kg)  07/06/15 202 lb 13.2 oz (92 kg)       ASSESSMENT AND PLAN:  Acute deep vein thrombosis (DVT) of distal vein of left lower extremity ([place]) Only taking eliquis once a day, not twice a day. Unable to afford either eliquis or xarelto. This was discussed with her in detail After long discussion, recommended that she start warfarin. She was previously taking warfarin in 2012, 2094 with no complications.  Currently medication noncompliant with her anticoagulation as she is unable to afford them We have arranged follow-up in warfarin clinic through our office   Coronary artery disease involving native coronary artery of native heart without angina pectoris - Plan: EKG 12-Lead, HgB A1c Currently with no symptoms of angina. No further workup at this time. Continue current medication regimen. High risk of recurrent coronary disease given poorly controlled diabetes  HYPERTENSION, BENIGN Blood pressure is well controlled on today's visit. No changes made to the medications.  Obesity, Class II, BMI 35-39.9, with comorbidity ([place]) We have encouraged continued exercise, careful diet management in an effort to lose weight.  Uncontrolled type 2 diabetes mellitus with complication, without long-term current use of insulin (HCC) Hemoglobin A1c of 12 We will recheck today with lab work She is not taking her new medication, Januvia as she is unable to afford this. Reports she is unable to afford $45 Significant dietary indiscretion, eating cake and pie  HLD (hyperlipidemia) Cholesterol is at goal on the current lipid regimen. No changes to the medications were made.     Total encounter time more than 25 minutes  Greater than 50% was spent in counseling and coordination of care with the patient   Disposition:   F/U  6 months   Orders Placed This Encounter  Procedures  . HgB A1c  . EKG 12-Lead     Signed, Esmond Plants,  M.D., Ph.D. 10/13/2015  [place], [place]

## 2015-10-13 NOTE — Telephone Encounter (Signed)
Reviewed coumadin and eliquis instructions and coumadin appt w/pt. She verbalized understanding but states she will not be in town June 26- July 1. She is unsure if she would be able to go to our [facility] location.  S/w Danae Chen who advised have pt continue Eliquis BID (we will provide samples) through July 7. No coumadin at this time. Start coumadin July 5, check INR July 12 Reviewed w/Dr. Rockey Situ who is agreeable w/plan.  Reviewed new plan w/pt. She understands to continue Eliquis 5mg  BID thorough July 7. She will start coumadin as directed beginning July 5.  INR check July 12, 3pm. Asked pt to write down this information. Pt repeated back. I have placed elqiuis 5mg  samples at front desk along with instructions. Pt states her grandson will pick them up. She verbalized understanding of all instructions with no further questions.

## 2015-10-14 LAB — HEMOGLOBIN A1C
Est. average glucose Bld gHb Est-mCnc: 157 mg/dL
Hgb A1c MFr Bld: 7.1 % — ABNORMAL HIGH (ref 4.8–5.6)

## 2015-11-03 ENCOUNTER — Ambulatory Visit (INDEPENDENT_AMBULATORY_CARE_PROVIDER_SITE_OTHER): Payer: Medicare Other | Admitting: *Deleted

## 2015-11-03 DIAGNOSIS — Z86711 Personal history of pulmonary embolism: Secondary | ICD-10-CM

## 2015-11-03 DIAGNOSIS — I824Z2 Acute embolism and thrombosis of unspecified deep veins of left distal lower extremity: Secondary | ICD-10-CM

## 2015-11-03 DIAGNOSIS — Z5181 Encounter for therapeutic drug level monitoring: Secondary | ICD-10-CM | POA: Diagnosis not present

## 2015-11-03 DIAGNOSIS — Z8673 Personal history of transient ischemic attack (TIA), and cerebral infarction without residual deficits: Secondary | ICD-10-CM | POA: Insufficient documentation

## 2015-11-03 LAB — POCT INR: INR: 1.4

## 2015-11-03 NOTE — Patient Instructions (Signed)

## 2015-11-10 ENCOUNTER — Ambulatory Visit (INDEPENDENT_AMBULATORY_CARE_PROVIDER_SITE_OTHER): Payer: Medicare Other

## 2015-11-10 DIAGNOSIS — Z86711 Personal history of pulmonary embolism: Secondary | ICD-10-CM | POA: Diagnosis not present

## 2015-11-10 DIAGNOSIS — Z5181 Encounter for therapeutic drug level monitoring: Secondary | ICD-10-CM | POA: Diagnosis not present

## 2015-11-10 DIAGNOSIS — I824Z2 Acute embolism and thrombosis of unspecified deep veins of left distal lower extremity: Secondary | ICD-10-CM | POA: Diagnosis not present

## 2015-11-10 LAB — POCT INR: INR: 3.7

## 2015-11-17 ENCOUNTER — Ambulatory Visit (INDEPENDENT_AMBULATORY_CARE_PROVIDER_SITE_OTHER): Payer: Medicare Other | Admitting: Cardiovascular Disease

## 2015-11-17 ENCOUNTER — Encounter: Payer: Self-pay | Admitting: Cardiovascular Disease

## 2015-11-17 ENCOUNTER — Ambulatory Visit (INDEPENDENT_AMBULATORY_CARE_PROVIDER_SITE_OTHER): Payer: Medicare Other

## 2015-11-17 VITALS — BP 140/70 | HR 58 | Ht 62.0 in | Wt 210.2 lb

## 2015-11-17 DIAGNOSIS — Z86711 Personal history of pulmonary embolism: Secondary | ICD-10-CM

## 2015-11-17 DIAGNOSIS — I824Z2 Acute embolism and thrombosis of unspecified deep veins of left distal lower extremity: Secondary | ICD-10-CM | POA: Diagnosis not present

## 2015-11-17 DIAGNOSIS — Z0000 Encounter for general adult medical examination without abnormal findings: Secondary | ICD-10-CM | POA: Diagnosis not present

## 2015-11-17 DIAGNOSIS — I82402 Acute embolism and thrombosis of unspecified deep veins of left lower extremity: Secondary | ICD-10-CM

## 2015-11-17 DIAGNOSIS — I10 Essential (primary) hypertension: Secondary | ICD-10-CM

## 2015-11-17 DIAGNOSIS — I2510 Atherosclerotic heart disease of native coronary artery without angina pectoris: Secondary | ICD-10-CM

## 2015-11-17 DIAGNOSIS — E118 Type 2 diabetes mellitus with unspecified complications: Secondary | ICD-10-CM

## 2015-11-17 DIAGNOSIS — Z5181 Encounter for therapeutic drug level monitoring: Secondary | ICD-10-CM | POA: Diagnosis not present

## 2015-11-17 DIAGNOSIS — IMO0002 Reserved for concepts with insufficient information to code with codable children: Secondary | ICD-10-CM

## 2015-11-17 DIAGNOSIS — E785 Hyperlipidemia, unspecified: Secondary | ICD-10-CM

## 2015-11-17 DIAGNOSIS — E1165 Type 2 diabetes mellitus with hyperglycemia: Secondary | ICD-10-CM

## 2015-11-17 LAB — POCT INR: INR: 3

## 2015-11-17 NOTE — Patient Instructions (Signed)
Testing/Procedures:   We will schedule a lower extremity venous doppler of the left lower extremity for DVT (follow up)  Wear compression hose if you come off warfarin   Follow-Up: It was a pleasure seeing you in the office today. Please call us if you have new issues that need to be addressed before your next appt.  [phone]  Your physician wants you to follow-up in: 6 months.  You will receive a reminder letter in the mail two months in advance. If you don't receive a letter, please call our office to schedule the follow-up appointment.  If you need a refill on your cardiac medications before your next appointment, please call your pharmacy.
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
## 2015-11-23 ENCOUNTER — Ambulatory Visit: Payer: Medicare Other

## 2015-11-23 DIAGNOSIS — I2510 Atherosclerotic heart disease of native coronary artery without angina pectoris: Secondary | ICD-10-CM

## 2015-11-23 DIAGNOSIS — Z0000 Encounter for general adult medical examination without abnormal findings: Secondary | ICD-10-CM

## 2015-11-23 DIAGNOSIS — I82402 Acute embolism and thrombosis of unspecified deep veins of left lower extremity: Secondary | ICD-10-CM

## 2015-11-23 DIAGNOSIS — I10 Essential (primary) hypertension: Secondary | ICD-10-CM

## 2015-12-01 ENCOUNTER — Telehealth: Payer: Self-pay | Admitting: Cardiovascular Disease

## 2015-12-01 ENCOUNTER — Ambulatory Visit (INDEPENDENT_AMBULATORY_CARE_PROVIDER_SITE_OTHER): Payer: Medicare Other

## 2015-12-01 DIAGNOSIS — Z5181 Encounter for therapeutic drug level monitoring: Secondary | ICD-10-CM

## 2015-12-01 DIAGNOSIS — I824Z2 Acute embolism and thrombosis of unspecified deep veins of left distal lower extremity: Secondary | ICD-10-CM

## 2015-12-01 DIAGNOSIS — Z86711 Personal history of pulmonary embolism: Secondary | ICD-10-CM | POA: Diagnosis not present

## 2015-12-01 LAB — POCT INR: INR: 2.8

## 2015-12-01 NOTE — Telephone Encounter (Signed)
DVT scan has not been reviewed by Dr. Rockey Situ and let her know that he is out this week. Instructed patient to continue coumadin as directed and that she should get a call next week with results and if not to give [place] a call back. She verbalized understanding and had no further questions at this time.

## 2015-12-01 NOTE — Telephone Encounter (Signed)
Patient in office for coum check.   Wants dvt scan results from 08/01 she says she wants to stop taking coumadin as she is spitting up blood in the morning.

## 2015-12-03 ENCOUNTER — Telehealth: Payer: Self-pay | Admitting: *Deleted

## 2015-12-03 NOTE — Telephone Encounter (Signed)
-----   Message from Minna Merritts, MD sent at 12/03/2015  2:27 PM EDT ----- Leg u/s Still with partial obstructing DVT Would recommend she continue warfarin

## 2015-12-03 NOTE — Telephone Encounter (Signed)
Patient verbalized understanding of results and recommendations to stay on warfarin but she states that she is still spitting up blood. She states that she will try to take it for another week but after that she is going to stop it. She insists that she does not want to keep bleeding and she needs to see Dr. Rockey Situ. Scheduled her a return office visit for 12/22/15 with Dr. Rockey Situ as well as her coumadin check that day. She did agree to stay on her medication until she comes to see him and that if she should continue bleeding then she would call us back before then. She had no further questions at this time and instructed her to call back if the bleeding continues.

## 2015-12-04 ENCOUNTER — Other Ambulatory Visit: Payer: Self-pay | Admitting: Cardiovascular Disease

## 2015-12-06 NOTE — Telephone Encounter (Signed)
Review for refill, Thank you. 

## 2015-12-21 ENCOUNTER — Ambulatory Visit: Payer: Medicare Other | Admitting: Family Medicine

## 2015-12-22 ENCOUNTER — Ambulatory Visit (INDEPENDENT_AMBULATORY_CARE_PROVIDER_SITE_OTHER): Payer: Medicare Other | Admitting: Cardiovascular Disease

## 2015-12-22 ENCOUNTER — Encounter: Payer: Self-pay | Admitting: Cardiovascular Disease

## 2015-12-22 ENCOUNTER — Ambulatory Visit (INDEPENDENT_AMBULATORY_CARE_PROVIDER_SITE_OTHER): Payer: Medicare Other | Admitting: *Deleted

## 2015-12-22 VITALS — BP 158/84 | HR 67 | Ht 62.0 in | Wt 209.5 lb

## 2015-12-22 DIAGNOSIS — I10 Essential (primary) hypertension: Secondary | ICD-10-CM | POA: Diagnosis not present

## 2015-12-22 DIAGNOSIS — Z86711 Personal history of pulmonary embolism: Secondary | ICD-10-CM

## 2015-12-22 DIAGNOSIS — I2510 Atherosclerotic heart disease of native coronary artery without angina pectoris: Secondary | ICD-10-CM | POA: Diagnosis not present

## 2015-12-22 DIAGNOSIS — I824Z2 Acute embolism and thrombosis of unspecified deep veins of left distal lower extremity: Secondary | ICD-10-CM | POA: Diagnosis not present

## 2015-12-22 DIAGNOSIS — I700 Atherosclerosis of aorta: Secondary | ICD-10-CM

## 2015-12-22 DIAGNOSIS — Z5181 Encounter for therapeutic drug level monitoring: Secondary | ICD-10-CM | POA: Diagnosis not present

## 2015-12-22 LAB — POCT INR: INR: 3.5
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
## 2015-12-22 NOTE — Patient Instructions (Addendum)
Medication Instructions:   No medication changes made Consider staying on warfarin until after your trip to [facility]:  No new labs needed  Testing/Procedures:  No further testing at this time   Follow-Up: It was a pleasure seeing you in the office today. Please call us if you have new issues that need to be addressed before your next appt.  [phone]  Your physician wants you to follow-up in: 6 months.  You will receive a reminder letter in the mail two months in advance. If you don't receive a letter, please call our office to schedule the follow-up appointment.  If you need a refill on your cardiac medications before your next appointment, please call your pharmacy.

## 2015-12-24 DIAGNOSIS — I700 Atherosclerosis of aorta: Secondary | ICD-10-CM | POA: Insufficient documentation

## 2015-12-24 HISTORY — DX: Atherosclerosis of aorta: I70.0

## 2015-12-31 ENCOUNTER — Encounter: Payer: Self-pay | Admitting: Family Medicine

## 2015-12-31 ENCOUNTER — Ambulatory Visit (INDEPENDENT_AMBULATORY_CARE_PROVIDER_SITE_OTHER): Payer: Medicare Other | Admitting: Family Medicine

## 2015-12-31 ENCOUNTER — Ambulatory Visit (INDEPENDENT_AMBULATORY_CARE_PROVIDER_SITE_OTHER)
Admission: RE | Admit: 2015-12-31 | Discharge: 2015-12-31 | Disposition: A | Discharge status: Home / Self Care | Payer: Medicare Other | Source: Ambulatory Visit | Attending: Family Medicine | Admitting: Family Medicine

## 2015-12-31 VITALS — BP 136/84 | HR 80 | Temp 97.9°F | Wt 212.2 lb

## 2015-12-31 DIAGNOSIS — E118 Type 2 diabetes mellitus with unspecified complications: Secondary | ICD-10-CM

## 2015-12-31 DIAGNOSIS — D649 Anemia, unspecified: Secondary | ICD-10-CM | POA: Diagnosis not present

## 2015-12-31 DIAGNOSIS — I82432 Acute embolism and thrombosis of left popliteal vein: Secondary | ICD-10-CM

## 2015-12-31 DIAGNOSIS — R042 Hemoptysis: Secondary | ICD-10-CM | POA: Diagnosis not present

## 2015-12-31 DIAGNOSIS — E1165 Type 2 diabetes mellitus with hyperglycemia: Secondary | ICD-10-CM

## 2015-12-31 DIAGNOSIS — I10 Essential (primary) hypertension: Secondary | ICD-10-CM | POA: Diagnosis not present

## 2015-12-31 DIAGNOSIS — E785 Hyperlipidemia, unspecified: Secondary | ICD-10-CM | POA: Diagnosis not present

## 2015-12-31 DIAGNOSIS — IMO0002 Reserved for concepts with insufficient information to code with codable children: Secondary | ICD-10-CM

## 2015-12-31 LAB — LDL CHOLESTEROL, DIRECT: Direct LDL: 102 mg/dL

## 2015-12-31 LAB — CBC WITH DIFFERENTIAL/PLATELET
BASOS ABS: 0 10*3/uL (ref 0.0–0.1)
BASOS PCT: 0.4 % (ref 0.0–3.0)
EOS ABS: 0 10*3/uL (ref 0.0–0.7)
Eosinophils Relative: 0.6 % (ref 0.0–5.0)
HEMATOCRIT: 34.3 % — AB (ref 36.0–46.0)
Hemoglobin: 11 g/dL — ABNORMAL LOW (ref 12.0–15.0)
LYMPHS ABS: 2.3 10*3/uL (ref 0.7–4.0)
LYMPHS PCT: 33.2 % (ref 12.0–46.0)
MCHC: 32.1 g/dL (ref 30.0–36.0)
MCV: 80.6 fl (ref 78.0–100.0)
MONO ABS: 0.5 10*3/uL (ref 0.1–1.0)
Monocytes Relative: 7 % (ref 3.0–12.0)
NEUTROS ABS: 4 10*3/uL (ref 1.4–7.7)
NEUTROS PCT: 58.8 % (ref 43.0–77.0)
PLATELETS: 274 10*3/uL (ref 150.0–400.0)
RBC: 4.26 Mil/uL (ref 3.87–5.11)
RDW: 18 % — AB (ref 11.5–15.5)
WBC: 6.8 10*3/uL (ref 4.0–10.5)

## 2015-12-31 LAB — BASIC METABOLIC PANEL
BUN: 15 mg/dL (ref 6–23)
CALCIUM: 8.8 mg/dL (ref 8.4–10.5)
CHLORIDE: 104 meq/L (ref 96–112)
CO2: 31 meq/L (ref 19–32)
CREATININE: 0.85 mg/dL (ref 0.40–1.20)
GFR: 83.99 mL/min (ref >60.00)
GLUCOSE: 224 mg/dL — AB (ref 70–99)
Potassium: 3.4 mEq/L — ABNORMAL LOW (ref 3.5–5.1)
Sodium: 139 mEq/L (ref 135–145)

## 2015-12-31 NOTE — Progress Notes (Signed)
 BP 136/84   Pulse 80   Temp 97.9 F (36.6 C) (Oral)   Wt 212 lb 4 oz (96.3 kg)   BMI 38.82 kg/m    CC: 3mo f/u visit Subjective:    Patient ID: Kristen [name], female    DOB: 02/25/1941, 75 y.o.   MRN: [id number]  HPI: Kristen [name] is a 75 y.o. female presenting on 12/31/2015 for Follow-up   Recurrent DVT suffered 05/2015. Started on eliquis (only taking once daily), seen by heme Dr Corcoran. rec lifelong anticoagulation as recent DVT likely new (h/o DVT/PE 2012). Changed to coumadin - started spitting up blood. Does not want to continue lifelong anticoagulation. Rpt US showed partial obstructing DVT. She decided to stop anticoagulation after 6 months on coumadin. Coumadin stopped Sunday. Has been recommended to wear compression hose for periods of immobility.   Spitting up blood in am, but this has improved since coumadin was stopped. Mild dizziness over last few days. Denies weight loss, dysphagia, nausea/vomiting or early satiety. Otherwise feeling great today.   HTN - Compliant with current antihypertensive regimen of amlodipine 5mg daily and losartan hctz daily. Does not check blood pressures at home. No low blood pressure readings or symptoms of dizziness/syncope. Denies HA, vision changes, CP/tightness, SOB, leg swelling.    DM - checking sugars infrequently. "No bread in 3 days". Compliant with antihyperglycemic regimen which includes: glimepiride 4mg daily and januvia 100mg daily. Last visit we started januvia 100mg daily due to uncontrolled sugars. Denies hypoglycemic symptoms. Denies paresthesias. Last diabetic eye exam: DUE. Pneumovax: 2016. Prevnar: 2015. Less eating out, more preparing meals at home.  Diabetic Foot Exam - Simple   Simple Foot Form Diabetic Foot exam was performed with the following findings:  Yes 12/31/2015  9:26 AM  Visual Inspection No deformities, no ulcerations, no other skin breakdown bilaterally:  Yes Sensation Testing Intact to touch and  monofilament testing bilaterally:  Yes Pulse Check Posterior Tibialis and Dorsalis pulse intact bilaterally:  Yes Comments     Lab Results  Component Value Date   HGBA1C 7.1 (H) 10/13/2015     Declines flu shot.   Relevant past medical, surgical, family and social history reviewed and updated as indicated. Interim medical history since our last visit reviewed. Allergies and medications reviewed and updated. Current Outpatient Prescriptions on File Prior to Visit  Medication Sig  . amLODipine (NORVASC) 5 MG tablet TAKE 1 TABLET (5 MG TOTAL) BY MOUTH DAILY.  . baclofen (LIORESAL) 10 MG tablet Take 1 tablet (10 mg total) by mouth 3 (three) times daily. As needed for muscle spasm  . Blood Glucose Monitoring Suppl (ONE TOUCH ULTRA SYSTEM KIT) w/Device KIT 1 kit by Does not apply route once.  . glimepiride (AMARYL) 4 MG tablet Take 4 mg by mouth daily with breakfast.  . glucose blood test strip Ck blood sugar twice a day and as directed.  . losartan-hydrochlorothiazide (HYZAAR) 100-12.5 MG tablet Take 1 tablet by mouth daily.  . nitroGLYCERIN (NITROSTAT) 0.4 MG SL tablet Place 0.4 mg under the tongue every 5 (five) minutes as needed for chest pain.  . ONETOUCH DELICA LANCETS 33G MISC Check blood sugar twice a day and as directed.  . sitaGLIPtin (JANUVIA) 100 MG tablet Take 1 tablet (100 mg total) by mouth daily.   No current facility-administered medications on file prior to visit.     Review of Systems Per HPI unless specifically indicated in ROS section     Objective:    BP   136/84   Pulse 80   Temp 97.9 F (36.6 C) (Oral)   Wt 212 lb 4 oz (96.3 kg)   BMI 38.82 kg/m   Wt Readings from Last 3 Encounters:  12/31/15 212 lb 4 oz (96.3 kg)  12/22/15 209 lb 8 oz (95 kg)  11/17/15 210 lb 4 oz (95.4 kg)    Physical Exam  Constitutional: She appears well-developed and well-nourished. No distress.  HENT:  Head: Normocephalic and atraumatic.  Right Ear: External ear normal.  Left  Ear: External ear normal.  Nose: Nose normal.  Mouth/Throat: Oropharynx is clear and moist. No oropharyngeal exudate.  Eyes: Conjunctivae and EOM are normal. Pupils are equal, round, and reactive to light. No scleral icterus.  Neck: Normal range of motion. Neck supple.  Cardiovascular: Normal rate, regular rhythm, normal heart sounds and intact distal pulses.   No murmur heard. Pulmonary/Chest: Effort normal and breath sounds normal. No respiratory distress. She has no wheezes. She has no rales.  Musculoskeletal: She exhibits no edema.  See HPI for foot exam if done  Lymphadenopathy:    She has no cervical adenopathy.  Skin: Skin is warm and dry. No rash noted.  Psychiatric: She has a normal mood and affect.  Nursing note and vitals reviewed.  Results for orders placed or performed in visit on 12/22/15  POCT INR  Result Value Ref Range   INR 3.5       Assessment & Plan:   Problem List Items Addressed This Visit    Anemia   Relevant Orders   CBC with Differential/Platelet   Diabetes type 2, uncontrolled (HCC) - Primary    Chronic, improved. Intermittent cbg's. Glycemic control improved on januvia 100mg daily - continue current regimen.       Relevant Orders   LDL Cholesterol, Direct   DVT of leg (deep venous thrombosis) (HCC)    Pt completed 6 months of anticoagulation and has decided to stop despite recommended lifelong AC. Aware of risk of recurrent DVT/PE.       Hemoptysis    Stopped due to concern for "coughing up blood" which has since improved.  Check CXR today.       Relevant Orders   DG Chest 2 View   HLD (hyperlipidemia)    Off statin, unclear why. Update dLDL.       HYPERTENSION, BENIGN    Chronic, stable. Continue current regimen.      Relevant Orders   Basic metabolic panel    Other Visit Diagnoses   None.      Follow up plan: Return in about 4 months (around 05/01/2016), or as needed, for medicare wellness visit.   , MD   

## 2015-12-31 NOTE — Assessment & Plan Note (Signed)
Stopped due to concern for "coughing up blood" which has since improved.  Check CXR today.

## 2015-12-31 NOTE — Patient Instructions (Addendum)
Labs today Good to see you today, call us with questions. Return as needed or in 4-6 months for medicare wellness visit

## 2015-12-31 NOTE — Assessment & Plan Note (Addendum)
Pt completed 6 months of anticoagulation and has decided to stop despite recommended lifelong AC. Aware of risk of recurrent DVT/PE.

## 2015-12-31 NOTE — Progress Notes (Signed)
Pre visit review using our clinic review tool, if applicable. No additional management support is needed unless otherwise documented below in the visit note. 

## 2015-12-31 NOTE — Assessment & Plan Note (Signed)
Chronic, stable. Continue current regimen. 

## 2015-12-31 NOTE — Assessment & Plan Note (Signed)
Chronic, improved. Intermittent cbg's. Glycemic control improved on januvia 100mg  daily - continue current regimen.

## 2015-12-31 NOTE — Assessment & Plan Note (Signed)
Off statin, unclear why. Update dLDL.

## 2016-01-03 ENCOUNTER — Telehealth: Payer: Self-pay | Admitting: *Deleted

## 2016-01-03 NOTE — Telephone Encounter (Signed)
Med list updated

## 2016-01-18 ENCOUNTER — Telehealth: Payer: Self-pay

## 2016-01-18 NOTE — Telephone Encounter (Signed)
Called spoke with pt, pt states she stopped Coumadin after discussing with Dr Rockey Situ.

## 2016-01-18 NOTE — Telephone Encounter (Signed)
-----   Message from Minna Merritts, MD sent at 12/22/2015  1:09 PM EDT ----- Kristen [name] has indicated she would like to stop warfarin. Coughing up blood, reports having general malaise, does not feel well because of the Coumadin She has done 6 months following recent DVT She has nonocclusive thrombus, chronic at this stage. Recommended she stay on warfarin through upcoming trip to [place] next Thursday (goes for 1 week). Driving in a car. Suggested compression hose. Certainly her decision when to stop the warfarin thx Tg

## 2016-01-22 ENCOUNTER — Emergency Department
Admission: EM | Admit: 2016-01-22 | Discharge: 2016-01-22 | Disposition: A | Discharge status: Home / Self Care | Payer: Medicare Other | Attending: Student | Admitting: Student

## 2016-01-22 ENCOUNTER — Encounter: Payer: Self-pay | Admitting: Emergency Medicine

## 2016-01-22 DIAGNOSIS — M79642 Pain in left hand: Secondary | ICD-10-CM | POA: Diagnosis present

## 2016-01-22 DIAGNOSIS — E119 Type 2 diabetes mellitus without complications: Secondary | ICD-10-CM | POA: Insufficient documentation

## 2016-01-22 DIAGNOSIS — I252 Old myocardial infarction: Secondary | ICD-10-CM | POA: Diagnosis not present

## 2016-01-22 DIAGNOSIS — I10 Essential (primary) hypertension: Secondary | ICD-10-CM | POA: Insufficient documentation

## 2016-01-22 DIAGNOSIS — I2510 Atherosclerotic heart disease of native coronary artery without angina pectoris: Secondary | ICD-10-CM | POA: Insufficient documentation

## 2016-01-22 DIAGNOSIS — M10042 Idiopathic gout, left hand: Secondary | ICD-10-CM | POA: Insufficient documentation

## 2016-01-22 DIAGNOSIS — Z8673 Personal history of transient ischemic attack (TIA), and cerebral infarction without residual deficits: Secondary | ICD-10-CM | POA: Insufficient documentation

## 2016-01-22 DIAGNOSIS — Z79899 Other long term (current) drug therapy: Secondary | ICD-10-CM | POA: Diagnosis not present

## 2016-01-22 MED ORDER — HYDROMORPHONE HCL 1 MG/ML IJ SOLN
0.5000 mg | Freq: Once | INTRAMUSCULAR | Status: AC
  Administered 2016-01-22: 0.5 mg via INTRAMUSCULAR
  Filled 2016-01-22: qty 1

## 2016-01-22 MED ORDER — TRAMADOL HCL 50 MG PO TABS: 50.0000 mg | ORAL_TABLET | Freq: Four times a day (QID) | ORAL | 0 refills | Status: DC | PRN

## 2016-01-22 MED ORDER — COLCHICINE 0.6 MG PO TABS
1.2000 mg | ORAL_TABLET | Freq: Once | ORAL | Status: AC
  Administered 2016-01-22: 1.2 mg via ORAL
  Filled 2016-01-22: qty 2

## 2016-01-22 MED ORDER — COLCHICINE 0.6 MG PO TABS: 0.6000 mg | ORAL_TABLET | Freq: Every day | ORAL | 0 refills | Status: DC

## 2016-01-22 NOTE — ED Triage Notes (Signed)
L hand pain x 3 days, history of gout.

## 2016-01-22 NOTE — ED Notes (Signed)
Left hand swollen and very painful  Redness and swelling up to wrist area  Hx of gout

## 2016-01-22 NOTE — ED Provider Notes (Signed)
[facility] Emergency Department Provider Note  ____________________________________________  Time seen: Approximately 3:09 PM  I have reviewed the triage vital signs and the nursing notes.   HISTORY  Chief Complaint Hand Pain    HPI Kristen [name] is a 75 y.o. female presents for evaluation of a swollen left hand and wrist. Patient reports that she does have a past medical history of gout.   Past Medical History:  Diagnosis Date  . Abdominal aortic atherosclerosis ([place]) by CT 02/2014  . CAD (coronary artery disease)    by CT, per pt h/o MI  . Diabetes type 2, uncontrolled ([place])   . Frequent headaches   . History of pulmonary embolism 2012  . HLD (hyperlipidemia)   . HTN (hypertension)   . 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 Morbid obesity ([place])   . Myocardial infarction [facility]) 2012   per pt. report, states she was treated with medicine, here at [facility]    . Osteoarthritis    knees  . Primary localized osteoarthritis of left knee 09/22/2014  . Sleep apnea 2011   study done in [place], states that since she lost weight she doesn't use the CPAP any longer & she doesn't have a problem with sleep apnea  . Stroke [facility])    still has balance problem on occas. , uses cane but that's mainly for the left knee pain  . Thoracic aortic atherosclerosis ([place]) 12/2015   by CXR  . Vertigo    hx. benign postitional postural    Patient Active Problem List   Diagnosis Date Noted  . Hemoptysis 12/31/2015  . Thoracic aortic atherosclerosis ([place]) 12/24/2015  . CVA (cerebral infarction) 11/03/2015  . Abdominal aortic atherosclerosis ([place])   . Anemia 06/18/2015  . Sensorineural hearing loss (SNHL) of both ears 03/29/2015  . Axillary mass 03/10/2015  . Pain of right middle finger 12/22/2014  . Memory deficit 12/04/2014  . Acute left-sided back pain with sciatica 04/01/2014   . Primary osteoarthritis of left knee 01/27/2014  . Advanced care planning/counseling discussion 12/11/2013  . Medicare annual wellness visit, subsequent 12/11/2013  . Health maintenance examination 12/11/2013  . CAD (coronary artery disease)   . Chest pain 07/17/2013  . Hair loss 05/26/2013  . Benign paroxysmal positional vertigo 05/26/2013  . HLD (hyperlipidemia)   . Obesity, Class II, BMI 35-39.9, with comorbidity ([place])   . History of pulmonary embolism   . Osteoarthritis   . Diabetes type 2, uncontrolled ([place])   . DVT of leg (deep venous thrombosis) ([place]) 12/21/2010  . HYPERTENSION, BENIGN 05/13/2009    Past Surgical History:  Procedure Laterality Date  . CATARACT EXTRACTION Bilateral 2013  . EYE SURGERY     /w IOL  . FOOT SURGERY Right   . PARTIAL HYSTERECTOMY     for fibroids, ovaries remain  . TONSILLECTOMY    . TOTAL KNEE ARTHROPLASTY Right 1990s  . TOTAL KNEE ARTHROPLASTY Left 09/22/2014   [name], MD  . TUBAL LIGATION      Prior to Admission medications   Medication Sig Start Date End Date Taking? Authorizing Provider  amLODipine (NORVASC) 5 MG tablet TAKE 1 TABLET (5 MG TOTAL) BY MOUTH DAILY. 03/10/15   [name], MD  baclofen (LIORESAL) 10 MG tablet Take 1 tablet (10 mg total) by mouth 3 (three) times daily. As needed for muscle spasm 09/22/14   [name], MD  Blood Glucose Monitoring Suppl (ONE TOUCH ULTRA SYSTEM KIT) w/Device KIT 1 kit by Does not apply route once. 09/17/15   [name], MD  colchicine 0.6 MG tablet Take 1 tablet (0.6 mg total) by mouth daily. 01/22/16 01/21/17  Pierce Crane Beers, PA-C  glimepiride (AMARYL) 4 MG tablet Take 4 mg by mouth daily with breakfast.    Historical Provider, MD  glucose blood test strip Ck blood sugar twice a day and as directed. 09/17/15   [name], MD  losartan-hydrochlorothiazide (HYZAAR) 100-12.5 MG tablet Take 1 tablet by mouth daily. 06/29/15   [name], MD  nitroGLYCERIN (NITROSTAT)  0.4 MG SL tablet Place 0.4 mg under the tongue every 5 (five) minutes as needed for chest pain.    Historical Provider, MD  [facility] DELICA LANCETS 80D MISC Check blood sugar twice a day and as directed. 09/17/15   [name], MD  simvastatin (ZOCOR) 20 MG tablet Take 20 mg by mouth daily.    Historical Provider, MD  sitaGLIPtin (JANUVIA) 100 MG tablet Take 1 tablet (100 mg total) by mouth daily. 07/01/15   [name], MD  traMADol (ULTRAM) 50 MG tablet Take 1 tablet (50 mg total) by mouth every 6 (six) hours as needed. 01/22/16 01/21/17  Pierce Crane Beers, PA-C    Allergies Acetaminophen; Aleve [naproxen sodium]; Metformin and related; and Penicillins  Family History  Problem Relation Age of Onset  . Cancer Mother     bone  . Diabetes Father   . Hypertension Father   . Cancer Son 31    lung  . Congenital heart disease Son 16    Social History Social History  Substance Use Topics  . Smoking status: Never Smoker  . Smokeless tobacco: Never Used     Comment: tobacco use- no   . Alcohol use 0.0 oz/week     Comment: Rare    Review of Systems Constitutional: No fever/chills Cardiovascular: Denies chest pain. Respiratory: Denies shortness of breath. Musculoskeletal: Positive for swollen left hand and hand pain. Skin: Negative for rash. Neurological: Negative for headaches, focal weakness or numbness.  10-point ROS otherwise negative.  ____________________________________________   PHYSICAL EXAM:  VITAL SIGNS: ED Triage Vitals  Enc Vitals Group     BP 01/22/16 1341 (!) 176/74     Pulse Rate 01/22/16 1341 89     Resp 01/22/16 1341 20     Temp 01/22/16 1341 99.2 F (37.3 C)     Temp Source 01/22/16 1341 Oral     SpO2 01/22/16 1341 100 %     Weight 01/22/16 1341 205 lb (93 kg)     Height 01/22/16 1341 5' 2" (1.575 m)     Head Circumference --      Peak Flow --      Pain Score 01/22/16 1342 10     Pain Loc --      Pain Edu? --      Excl. in [place]? --      Constitutional: Alert and oriented. Well appearing and in no acute distress.   Cardiovascular: Normal rate, regular rhythm. Grossly normal heart sounds.  Good peripheral circulation. Respiratory: Normal respiratory effort.  No retractions. Lungs CTAB. Musculoskeletal: Left hand positive warmth and tenderness. Positive edema with limited range of motion. Distally neurovascularly intact. Neurologic:  Normal speech and language. No gross focal neurologic deficits are appreciated.  Skin:  Skin is warm, dry and intact. No rash noted. Psychiatric: Mood and affect are normal. Speech and behavior are normal.  ____________________________________________   LABS (all labs ordered are listed, but only abnormal results are displayed)  Labs Reviewed - No data to display ____________________________________________  EKG   ____________________________________________  RADIOLOGY   ____________________________________________   PROCEDURES  Procedure(s) performed: None  Critical Care performed: No  ____________________________________________   INITIAL IMPRESSION / ASSESSMENT AND PLAN / ED COURSE  Pertinent labs & imaging results that were available during my care of the patient were reviewed by me and considered in my medical decision making (see chart for details). Review of the [place] CSRS was performed in accordance of the [place] prior to dispensing any controlled drugs.  Acute exacerbation of gout. Patient was given 1.2 mg of colchicine while in the ED and discharged with a prescription for 0.6 mg March take within 1 hour. Discharged home on tramadol 50 mg every 6 hours for pain. Dilaudid 0.5 mg IM given while in the ED. Patient voices no other emergency medical complaints at this time.  Clinical Course    ____________________________________________   FINAL CLINICAL IMPRESSION(S) / ED DIAGNOSES  Final diagnoses:  Acute idiopathic gout of left hand     This chart was  dictated using voice recognition software/Dragon. Despite best efforts to proofread, errors can occur which can change the meaning. Any change was purely unintentional.    Arlyss Repress, PA-C 01/22/16 [id number]    Joanne Gavel, MD 01/22/16 [phone]

## 2016-02-07 ENCOUNTER — Encounter: Payer: Self-pay | Admitting: Family Medicine

## 2016-02-07 ENCOUNTER — Ambulatory Visit (INDEPENDENT_AMBULATORY_CARE_PROVIDER_SITE_OTHER)
Admission: RE | Admit: 2016-02-07 | Discharge: 2016-02-07 | Disposition: A | Discharge status: Home / Self Care | Payer: Medicare Other | Source: Ambulatory Visit | Attending: Family Medicine | Admitting: Family Medicine

## 2016-02-07 ENCOUNTER — Ambulatory Visit (INDEPENDENT_AMBULATORY_CARE_PROVIDER_SITE_OTHER): Payer: Medicare Other | Admitting: Family Medicine

## 2016-02-07 VITALS — BP 160/90 | HR 74 | Temp 98.5°F | Wt 204.8 lb

## 2016-02-07 DIAGNOSIS — M25532 Pain in left wrist: Secondary | ICD-10-CM

## 2016-02-07 DIAGNOSIS — M25432 Effusion, left wrist: Secondary | ICD-10-CM

## 2016-02-07 DIAGNOSIS — M19032 Primary osteoarthritis, left wrist: Secondary | ICD-10-CM | POA: Insufficient documentation

## 2016-02-07 LAB — CBC WITH DIFFERENTIAL/PLATELET
BASOS ABS: 0 10*3/uL (ref 0.0–0.1)
Basophils Relative: 0.4 % (ref 0.0–3.0)
EOS ABS: 0 10*3/uL (ref 0.0–0.7)
Eosinophils Relative: 0.1 % (ref 0.0–5.0)
HCT: 36.5 % (ref 36.0–46.0)
HEMOGLOBIN: 11.7 g/dL — AB (ref 12.0–15.0)
Lymphocytes Relative: 15.3 % (ref 12.0–46.0)
Lymphs Abs: 1.9 10*3/uL (ref 0.7–4.0)
MCHC: 32 g/dL (ref 30.0–36.0)
MCV: 81.6 fl (ref 78.0–100.0)
MONO ABS: 0.7 10*3/uL (ref 0.1–1.0)
Monocytes Relative: 6.1 % (ref 3.0–12.0)
Neutro Abs: 9.5 10*3/uL — ABNORMAL HIGH (ref 1.4–7.7)
Neutrophils Relative %: 78.1 % — ABNORMAL HIGH (ref 43.0–77.0)
Platelets: 356 10*3/uL (ref 150.0–400.0)
RBC: 4.47 Mil/uL (ref 3.87–5.11)
RDW: 17.6 % — ABNORMAL HIGH (ref 11.5–15.5)
WBC: 12.2 10*3/uL — AB (ref 4.0–10.5)

## 2016-02-07 LAB — URIC ACID: URIC ACID, SERUM: 6.6 mg/dL (ref 2.4–7.0)

## 2016-02-07 MED ORDER — COLCHICINE 0.6 MG PO TABS: 0.6000 mg | ORAL_TABLET | Freq: Every day | ORAL | 0 refills | Status: DC

## 2016-02-07 NOTE — Patient Instructions (Addendum)
Possible gout - restart colchicine 2 tablets on first day, then 1 tablet daily as needed.  Hold simvastatin while you're taking colchicine. Labs today.  Xray today.  We will be in touch with results. Watch for fever and let me know if >101. Let us know if any worsening.

## 2016-02-07 NOTE — Progress Notes (Signed)
Pre visit review using our clinic review tool, if applicable. No additional management support is needed unless otherwise documented below in the visit note. 

## 2016-02-07 NOTE — Progress Notes (Signed)
BP (!) 160/90   Pulse 74   Temp 98.5 F (36.9 C) (Oral)   Wt 204 lb 12 oz (92.9 kg)   BMI 37.45 kg/m    CC: gout attack? Subjective:    Patient ID: Kristen [name], female    DOB: Aug 29, 1940, 75 y.o.   MRN: [id number]  HPI: Kristen [name] is a 75 y.o. female presenting on 02/07/2016 for gout in left hand   Recent visit 2 wks ago to [facility] ER with L wrist swelling attributed to gout - treated with colchicine. Discharged with 1 pill of colchicine and tramadol Rx. Endorses redness, pain, swelling, warmth of wrist that is some improved. Even tender with "bedsheet rubbing across it"  Denies inciting trauma/injury to wrist. No fevers/chills, no other joints affected.   No known h/o gout - although pt endorses h/o this several years ago Known DM controlled with [place] and glimepiride.  She is on hctz.   Relevant past medical, surgical, family and social history reviewed and updated as indicated. Interim medical history since our last visit reviewed. Allergies and medications reviewed and updated. Current Outpatient Prescriptions on File Prior to Visit  Medication Sig  . amLODipine (NORVASC) 5 MG tablet TAKE 1 TABLET (5 MG TOTAL) BY MOUTH DAILY.  . baclofen (LIORESAL) 10 MG tablet Take 1 tablet (10 mg total) by mouth 3 (three) times daily. As needed for muscle spasm  . Blood Glucose Monitoring Suppl (ONE TOUCH ULTRA SYSTEM KIT) w/Device KIT 1 kit by Does not apply route once.  Marland Kitchen glimepiride (AMARYL) 4 MG tablet Take 4 mg by mouth daily with breakfast.  . glucose blood test strip Ck blood sugar twice a day and as directed.  Marland Kitchen losartan-hydrochlorothiazide (HYZAAR) 100-12.5 MG tablet Take 1 tablet by mouth daily.  . nitroGLYCERIN (NITROSTAT) 0.4 MG SL tablet Place 0.4 mg under the tongue every 5 (five) minutes as needed for chest pain.  [name] DELICA LANCETS 93Y MISC Check blood sugar twice a day and as directed.  . simvastatin (ZOCOR) 20 MG tablet Take 20 mg by mouth daily.  .  sitaGLIPtin (JANUVIA) 100 MG tablet Take 1 tablet (100 mg total) by mouth daily.   No current facility-administered medications on file prior to visit.     Review of Systems Per HPI unless specifically indicated in ROS section     Objective:    BP (!) 160/90   Pulse 74   Temp 98.5 F (36.9 C) (Oral)   Wt 204 lb 12 oz (92.9 kg)   BMI 37.45 kg/m   Wt Readings from Last 3 Encounters:  02/07/16 204 lb 12 oz (92.9 kg)  01/22/16 205 lb (93 kg)  12/31/15 212 lb 4 oz (96.3 kg)    Physical Exam  Constitutional: She appears well-developed and well-nourished. No distress.  Musculoskeletal:  R wrist WNL L wrist and thenar hand with evident swelling, warmth, exquisite tenderness to palpation. Pain-limiting ROM  Skin: Skin is warm and dry. No rash noted. There is erythema.  Nursing note and vitals reviewed.  Results for orders placed or performed in visit on 02/07/50  Basic metabolic panel  Result Value Ref Range   Sodium 139 135 - 145 mEq/L   Potassium 3.4 (L) 3.5 - 5.1 mEq/L   Chloride 104 96 - 112 mEq/L   CO2 31 19 - 32 mEq/L   Glucose, Bld 224 (H) 70 - 99 mg/dL   BUN 15 6 - 23 mg/dL   Creatinine, Ser 0.85 0.40 -  1.20 mg/dL   Calcium 8.8 8.4 - 10.5 mg/dL   GFR 83.99 >60.00 mL/min  CBC with Differential/Platelet  Result Value Ref Range   WBC 6.8 4.0 - 10.5 K/uL   RBC 4.26 3.87 - 5.11 Mil/uL   Hemoglobin 11.0 (L) 12.0 - 15.0 g/dL   HCT 34.3 (L) 36.0 - 46.0 %   MCV 80.6 78.0 - 100.0 fl   MCHC 32.1 30.0 - 36.0 g/dL   RDW 18.0 (H) 11.5 - 15.5 %   Platelets 274.0 150.0 - 400.0 K/uL   Neutrophils Relative % 58.8 43.0 - 77.0 %   Lymphocytes Relative 33.2 12.0 - 46.0 %   Monocytes Relative 7.0 3.0 - 12.0 %   Eosinophils Relative 0.6 0.0 - 5.0 %   Basophils Relative 0.4 0.0 - 3.0 %   Neutro Abs 4.0 1.4 - 7.7 K/uL   Lymphs Abs 2.3 0.7 - 4.0 K/uL   Monocytes Absolute 0.5 0.1 - 1.0 K/uL   Eosinophils Absolute 0.0 0.0 - 0.7 K/uL   Basophils Absolute 0.0 0.0 - 0.1 K/uL  LDL  Cholesterol, Direct  Result Value Ref Range   Direct LDL 102.0 mg/dL   Lab Results  Component Value Date   HGBA1C 7.1 (H) 10/13/2015    Lab Results  Component Value Date   LABURIC 3.9 03/10/2015      Assessment & Plan:   Problem List Items Addressed This Visit    Pain and swelling of left wrist - Primary    Concern for persistent gout flare vs pseudogout vs other. Only prescribed 1 colchicine tablet upon recent ER discharge. Will extend colchicine Rx.  Check xray and labs to r/o infectious arthritis. Intolerant to NSAIDs. Consider prednisone course pending reassuring labwork. If gout - may need to change antihypertensive regimen.      Relevant Orders   DG Wrist Complete Left   CBC with Differential/Platelet   Uric acid   Sedimentation rate    Other Visit Diagnoses   None.      Follow up plan: Return if symptoms worsen or fail to improve.  [name], MD

## 2016-02-07 NOTE — Assessment & Plan Note (Addendum)
Concern for persistent gout flare vs pseudogout vs other. Only prescribed 1 colchicine tablet upon recent ER discharge. Will extend colchicine Rx.  Check xray and labs to r/o infectious arthritis. Intolerant to NSAIDs. Consider prednisone course pending reassuring labwork. If gout - may need to change antihypertensive regimen.

## 2016-02-08 LAB — SEDIMENTATION RATE: SED RATE: 130 mm/h — AB (ref 0–30)

## 2016-02-09 ENCOUNTER — Other Ambulatory Visit: Payer: Self-pay | Admitting: Family Medicine

## 2016-02-09 MED ORDER — DOXYCYCLINE HYCLATE 100 MG PO CAPS: 100.0000 mg | ORAL_CAPSULE | Freq: Two times a day (BID) | ORAL | 0 refills | Status: DC

## 2016-02-10 ENCOUNTER — Other Ambulatory Visit: Payer: Self-pay | Admitting: Family Medicine

## 2016-02-10 MED ORDER — TRAMADOL HCL 50 MG PO TABS: 50.0000 mg | ORAL_TABLET | Freq: Two times a day (BID) | ORAL | 0 refills | Status: DC | PRN

## 2016-02-21 ENCOUNTER — Ambulatory Visit (INDEPENDENT_AMBULATORY_CARE_PROVIDER_SITE_OTHER): Payer: Medicare Other | Admitting: Family Medicine

## 2016-02-21 ENCOUNTER — Encounter: Payer: Self-pay | Admitting: Family Medicine

## 2016-02-21 VITALS — BP 142/76 | HR 104 | Temp 98.2°F | Wt 206.5 lb

## 2016-02-21 DIAGNOSIS — M25432 Effusion, left wrist: Secondary | ICD-10-CM

## 2016-02-21 DIAGNOSIS — M25532 Pain in left wrist: Secondary | ICD-10-CM | POA: Diagnosis not present

## 2016-02-21 LAB — CBC WITH DIFFERENTIAL/PLATELET
BASOS PCT: 0.4 % (ref 0.0–3.0)
Basophils Absolute: 0 10*3/uL (ref 0.0–0.1)
EOS PCT: 0.4 % (ref 0.0–5.0)
Eosinophils Absolute: 0 10*3/uL (ref 0.0–0.7)
HCT: 34 % — ABNORMAL LOW (ref 36.0–46.0)
Hemoglobin: 11 g/dL — ABNORMAL LOW (ref 12.0–15.0)
LYMPHS ABS: 1.6 10*3/uL (ref 0.7–4.0)
Lymphocytes Relative: 24.5 % (ref 12.0–46.0)
MCHC: 32.3 g/dL (ref 30.0–36.0)
MCV: 81.5 fl (ref 78.0–100.0)
MONO ABS: 0.4 10*3/uL (ref 0.1–1.0)
MONOS PCT: 5.6 % (ref 3.0–12.0)
NEUTROS ABS: 4.6 10*3/uL (ref 1.4–7.7)
NEUTROS PCT: 69.1 % (ref 43.0–77.0)
PLATELETS: 267 10*3/uL (ref 150.0–400.0)
RBC: 4.17 Mil/uL (ref 3.87–5.11)
RDW: 16.9 % — AB (ref 11.5–15.5)
WBC: 6.6 10*3/uL (ref 4.0–10.5)

## 2016-02-21 LAB — SEDIMENTATION RATE: Sed Rate: 120 mm/hr — ABNORMAL HIGH (ref 0–30)

## 2016-02-21 LAB — URIC ACID: Uric Acid, Serum: 6.5 mg/dL (ref 2.4–7.0)

## 2016-02-21 MED ORDER — PREDNISONE 20 MG PO TABS: ORAL_TABLET | ORAL | 0 refills | Status: DC

## 2016-02-21 NOTE — Progress Notes (Signed)
BP (!) 142/76   Pulse (!) 104   Temp 98.2 F (36.8 C) (Oral)   Wt 206 lb 8 oz (93.7 kg)   BMI 37.77 kg/m    CC: f/u hand pain Subjective:    Patient ID: Kristen [name], female    DOB: December 26, 1940, 75 y.o.   MRN: [id number]  HPI: Kristen [name] is a 75 y.o. female presenting on 02/21/2016 for Follow-up   Seen 2 wks ago with L wrist pain/swelling concern for gout flare treated with colchicine course (statin held) and tramadol course. Xray unrevealing (DJD at 1st CMC, mild soft tissue swelling). Labs showed WBC 12.2, ESR 130, and urate 6.6. Doxycycline course was added after mild leukocytosis found - but she did not tolerate this due to malaise and GI upset - only took 2 pills.   Pain and redness have improved but swelling persists, predominantly around 1st CMC and dorsal wrist. No numbness. Weakness due to pain.   Intolerant to NSAIDs.  Denies inciting trauma/injury to wrist. No fevers/chills, no other joints affected.  Known DM controlled with januvia and glimepiride.  She is on hctz.   Relevant past medical, surgical, family and social history reviewed and updated as indicated. Interim medical history since our last visit reviewed. Allergies and medications reviewed and updated. Current Outpatient Prescriptions on File Prior to Visit  Medication Sig  . amLODipine (NORVASC) 5 MG tablet TAKE 1 TABLET (5 MG TOTAL) BY MOUTH DAILY.  . baclofen (LIORESAL) 10 MG tablet Take 1 tablet (10 mg total) by mouth 3 (three) times daily. As needed for muscle spasm  . Blood Glucose Monitoring Suppl (ONE TOUCH ULTRA SYSTEM KIT) w/Device KIT 1 kit by Does not apply route once.  . colchicine 0.6 MG tablet Take 1 tablet (0.6 mg total) by mouth daily. First day take 2 tablets  . glimepiride (AMARYL) 4 MG tablet Take 4 mg by mouth daily with breakfast.  . glucose blood test strip Ck blood sugar twice a day and as directed.  Marland Kitchen losartan-hydrochlorothiazide (HYZAAR) 100-12.5 MG tablet Take 1 tablet by  mouth daily.  . nitroGLYCERIN (NITROSTAT) 0.4 MG SL tablet Place 0.4 mg under the tongue every 5 (five) minutes as needed for chest pain.  [name] DELICA LANCETS 52D MISC Check blood sugar twice a day and as directed.  . simvastatin (ZOCOR) 20 MG tablet Take 20 mg by mouth daily.  . sitaGLIPtin (JANUVIA) 100 MG tablet Take 1 tablet (100 mg total) by mouth daily.  . traMADol (ULTRAM) 50 MG tablet Take 1 tablet (50 mg total) by mouth 2 (two) times daily as needed.   No current facility-administered medications on file prior to visit.     Review of Systems Per HPI unless specifically indicated in ROS section     Objective:    BP (!) 142/76   Pulse (!) 104   Temp 98.2 F (36.8 C) (Oral)   Wt 206 lb 8 oz (93.7 kg)   BMI 37.77 kg/m   Wt Readings from Last 3 Encounters:  02/21/16 206 lb 8 oz (93.7 kg)  02/07/16 204 lb 12 oz (92.9 kg)  01/22/16 205 lb (93 kg)    Physical Exam  Constitutional: She appears well-developed and well-nourished. No distress.  Musculoskeletal: She exhibits edema.  2+ rad pulses bilaterally  R wrist WNL L wrist - markedly point tender to palpation at ventral wrist and at 1st CMC, swelling present extending from wrist to mid dorsal hand, limited ROM flexion/extension of wrist  2/2 pain  Skin: Skin is warm and dry. No rash noted. No erythema.  Nursing note and vitals reviewed.  Results for orders placed or performed in visit on 02/07/16  CBC with Differential/Platelet  Result Value Ref Range   WBC 12.2 (H) 4.0 - 10.5 K/uL   RBC 4.47 3.87 - 5.11 Mil/uL   Hemoglobin 11.7 (L) 12.0 - 15.0 g/dL   HCT 36.5 36.0 - 46.0 %   MCV 81.6 78.0 - 100.0 fl   MCHC 32.0 30.0 - 36.0 g/dL   RDW 17.6 (H) 11.5 - 15.5 %   Platelets 356.0 150.0 - 400.0 K/uL   Neutrophils Relative % 78.1 (H) 43.0 - 77.0 %   Lymphocytes Relative 15.3 12.0 - 46.0 %   Monocytes Relative 6.1 3.0 - 12.0 %   Eosinophils Relative 0.1 0.0 - 5.0 %   Basophils Relative 0.4 0.0 - 3.0 %   Neutro Abs  9.5 (H) 1.4 - 7.7 K/uL   Lymphs Abs 1.9 0.7 - 4.0 K/uL   Monocytes Absolute 0.7 0.1 - 1.0 K/uL   Eosinophils Absolute 0.0 0.0 - 0.7 K/uL   Basophils Absolute 0.0 0.0 - 0.1 K/uL  Uric acid  Result Value Ref Range   Uric Acid, Serum 6.6 2.4 - 7.0 mg/dL  Sedimentation rate  Result Value Ref Range   Sed Rate 130 (H) 0 - 30 mm/hr   Lab Results  Component Value Date   HGBA1C 7.1 (H) 10/13/2015       Assessment & Plan:   Problem List Items Addressed This Visit    Pain and swelling of left wrist - Primary    Anticipate persistent gout flare. Not improving as quickly as would be desired however she only took 2 day course of colchicine. Will restart this. Will prescribe prednisone taper as well. If not quick improvement low threshold for imaging (MRI) vs ortho referral. Pt agrees with plan. Will also trend labs (CBC, urate, ESR) Discussed anticipated hyperglycemia while on prednisone - pt aware to avoid carbs while on prednisone.       Relevant Orders   CBC with Differential/Platelet   Uric acid   Sedimentation rate    Other Visit Diagnoses   None.      Follow up plan: Return if symptoms worsen or fail to improve.  [name], MD

## 2016-02-21 NOTE — Patient Instructions (Addendum)
Update labs today. Restart colchicine 1 pill daily for next several days (hold simvastatin when you take colchicine). Start prednisone course for next week. If not markedly better after 1-2 days of prednisone let me know for ortho referral.

## 2016-02-21 NOTE — Progress Notes (Signed)
Pre visit review using our clinic review tool, if applicable. No additional management support is needed unless otherwise documented below in the visit note. 

## 2016-02-21 NOTE — Assessment & Plan Note (Addendum)
Anticipate persistent gout flare. Not improving as quickly as would be desired however she only took 2 day course of colchicine. Will restart this. Will prescribe prednisone taper as well. If not quick improvement low threshold for imaging (MRI) vs ortho referral. Pt agrees with plan. Will also trend labs (CBC, urate, ESR) Discussed anticipated hyperglycemia while on prednisone - pt aware to avoid carbs while on prednisone.

## 2016-02-28 ENCOUNTER — Encounter (HOSPITAL_COMMUNITY): Payer: Self-pay | Admitting: Emergency Medicine

## 2016-02-28 DIAGNOSIS — M542 Cervicalgia: Secondary | ICD-10-CM | POA: Insufficient documentation

## 2016-02-28 DIAGNOSIS — D72829 Elevated white blood cell count, unspecified: Secondary | ICD-10-CM | POA: Insufficient documentation

## 2016-02-28 DIAGNOSIS — Z6838 Body mass index (BMI) 38.0-38.9, adult: Secondary | ICD-10-CM | POA: Insufficient documentation

## 2016-02-28 DIAGNOSIS — N179 Acute kidney failure, unspecified: Secondary | ICD-10-CM | POA: Diagnosis not present

## 2016-02-28 DIAGNOSIS — E876 Hypokalemia: Secondary | ICD-10-CM | POA: Diagnosis not present

## 2016-02-28 DIAGNOSIS — Z86718 Personal history of other venous thrombosis and embolism: Secondary | ICD-10-CM | POA: Diagnosis not present

## 2016-02-28 DIAGNOSIS — I252 Old myocardial infarction: Secondary | ICD-10-CM | POA: Insufficient documentation

## 2016-02-28 DIAGNOSIS — Z96653 Presence of artificial knee joint, bilateral: Secondary | ICD-10-CM | POA: Diagnosis not present

## 2016-02-28 DIAGNOSIS — M25512 Pain in left shoulder: Secondary | ICD-10-CM | POA: Diagnosis not present

## 2016-02-28 DIAGNOSIS — S060X9A Concussion with loss of consciousness of unspecified duration, initial encounter: Secondary | ICD-10-CM | POA: Diagnosis not present

## 2016-02-28 DIAGNOSIS — E785 Hyperlipidemia, unspecified: Secondary | ICD-10-CM | POA: Insufficient documentation

## 2016-02-28 DIAGNOSIS — W1830XA Fall on same level, unspecified, initial encounter: Secondary | ICD-10-CM | POA: Insufficient documentation

## 2016-02-28 DIAGNOSIS — R42 Dizziness and giddiness: Secondary | ICD-10-CM | POA: Diagnosis not present

## 2016-02-28 DIAGNOSIS — R55 Syncope and collapse: Principal | ICD-10-CM | POA: Insufficient documentation

## 2016-02-28 DIAGNOSIS — Z7984 Long term (current) use of oral hypoglycemic drugs: Secondary | ICD-10-CM | POA: Diagnosis not present

## 2016-02-28 DIAGNOSIS — R0602 Shortness of breath: Secondary | ICD-10-CM | POA: Diagnosis not present

## 2016-02-28 DIAGNOSIS — I10 Essential (primary) hypertension: Secondary | ICD-10-CM | POA: Diagnosis not present

## 2016-02-28 DIAGNOSIS — I2510 Atherosclerotic heart disease of native coronary artery without angina pectoris: Secondary | ICD-10-CM | POA: Diagnosis not present

## 2016-02-28 DIAGNOSIS — Z8673 Personal history of transient ischemic attack (TIA), and cerebral infarction without residual deficits: Secondary | ICD-10-CM | POA: Insufficient documentation

## 2016-02-28 DIAGNOSIS — S299XXA Unspecified injury of thorax, initial encounter: Secondary | ICD-10-CM | POA: Diagnosis not present

## 2016-02-28 DIAGNOSIS — S4992XA Unspecified injury of left shoulder and upper arm, initial encounter: Secondary | ICD-10-CM | POA: Diagnosis not present

## 2016-02-28 DIAGNOSIS — Z79899 Other long term (current) drug therapy: Secondary | ICD-10-CM | POA: Insufficient documentation

## 2016-02-28 DIAGNOSIS — M546 Pain in thoracic spine: Secondary | ICD-10-CM | POA: Diagnosis not present

## 2016-02-28 DIAGNOSIS — E119 Type 2 diabetes mellitus without complications: Secondary | ICD-10-CM | POA: Diagnosis not present

## 2016-02-28 DIAGNOSIS — S0990XA Unspecified injury of head, initial encounter: Secondary | ICD-10-CM | POA: Diagnosis not present

## 2016-02-28 DIAGNOSIS — S069X9A Unspecified intracranial injury with loss of consciousness of unspecified duration, initial encounter: Secondary | ICD-10-CM | POA: Diagnosis not present

## 2016-02-28 DIAGNOSIS — S199XXA Unspecified injury of neck, initial encounter: Secondary | ICD-10-CM | POA: Diagnosis not present

## 2016-02-28 DIAGNOSIS — R079 Chest pain, unspecified: Secondary | ICD-10-CM | POA: Diagnosis not present

## 2016-02-28 LAB — BASIC METABOLIC PANEL
ANION GAP: 9 (ref 5–15)
BUN: 32 mg/dL — ABNORMAL HIGH (ref 6–20)
CHLORIDE: 100 mmol/L — AB (ref 101–111)
CO2: 26 mmol/L (ref 22–32)
CREATININE: 1.4 mg/dL — AB (ref 0.44–1.00)
Calcium: 9.3 mg/dL (ref 8.9–10.3)
GFR calc non Af Amer: 36 mL/min — ABNORMAL LOW (ref >60)
GFR, EST AFRICAN AMERICAN: 42 mL/min — AB (ref >60)
Glucose, Bld: 351 mg/dL — ABNORMAL HIGH (ref 65–99)
POTASSIUM: 3.2 mmol/L — AB (ref 3.5–5.1)
Sodium: 135 mmol/L (ref 135–145)

## 2016-02-28 LAB — CBC
HEMATOCRIT: 38 % (ref 36.0–46.0)
HEMOGLOBIN: 12.2 g/dL (ref 12.0–15.0)
MCH: 26 pg (ref 26.0–34.0)
MCHC: 32.1 g/dL (ref 30.0–36.0)
MCV: 81 fL (ref 78.0–100.0)
Platelets: 288 10*3/uL (ref 150–400)
RBC: 4.69 MIL/uL (ref 3.87–5.11)
RDW: 15.7 % — ABNORMAL HIGH (ref 11.5–15.5)
WBC: 14.7 10*3/uL — ABNORMAL HIGH (ref 4.0–10.5)

## 2016-02-28 LAB — CBG MONITORING, ED: Glucose-Capillary: 314 mg/dL — ABNORMAL HIGH (ref 65–99)

## 2016-02-28 LAB — I-STAT TROPONIN, ED: Troponin i, poc: 0.03 ng/mL (ref 0.00–0.08)

## 2016-02-28 LAB — URINE MICROSCOPIC-ADD ON

## 2016-02-28 LAB — URINALYSIS, ROUTINE W REFLEX MICROSCOPIC
Bilirubin Urine: NEGATIVE
GLUCOSE, UA: 250 mg/dL — AB
Ketones, ur: NEGATIVE mg/dL
Leukocytes, UA: NEGATIVE
Nitrite: NEGATIVE
Protein, ur: NEGATIVE mg/dL
SPECIFIC GRAVITY, URINE: 1.025 (ref 1.005–1.030)
pH: 5.5 (ref 5.0–8.0)

## 2016-02-28 NOTE — ED Triage Notes (Signed)
Pt presents to ED for assessment after having a syncopal episode this evening with head injury.  Pt sts she has been feeling "weak and run down" all day.  Pt sts she went to the bathroom and got dizzy and light-headed after she stood.  Pt fell and hit her head on the left side.  C/o headache since fall.  Pt also c/o back pain and generalized achiness.  Pt denies CP or SOB.  Denies specific weakness, numbness or tingling.

## 2016-02-29 ENCOUNTER — Emergency Department (HOSPITAL_COMMUNITY): Payer: Medicare Other

## 2016-02-29 ENCOUNTER — Observation Stay (HOSPITAL_COMMUNITY)
Admission: EM | Admit: 2016-02-29 | Discharge: 2016-03-02 | Disposition: A | Discharge status: Home / Self Care | Payer: Medicare Other | Attending: Internal Medicine | Admitting: Internal Medicine

## 2016-02-29 ENCOUNTER — Observation Stay (HOSPITAL_COMMUNITY): Payer: Medicare Other

## 2016-02-29 DIAGNOSIS — R55 Syncope and collapse: Secondary | ICD-10-CM | POA: Diagnosis not present

## 2016-02-29 DIAGNOSIS — S060XAA Concussion with loss of consciousness status unknown, initial encounter: Secondary | ICD-10-CM

## 2016-02-29 DIAGNOSIS — R0602 Shortness of breath: Secondary | ICD-10-CM | POA: Diagnosis not present

## 2016-02-29 DIAGNOSIS — D72829 Elevated white blood cell count, unspecified: Secondary | ICD-10-CM | POA: Diagnosis present

## 2016-02-29 DIAGNOSIS — I10 Essential (primary) hypertension: Secondary | ICD-10-CM

## 2016-02-29 DIAGNOSIS — N179 Acute kidney failure, unspecified: Secondary | ICD-10-CM | POA: Diagnosis present

## 2016-02-29 DIAGNOSIS — S4992XA Unspecified injury of left shoulder and upper arm, initial encounter: Secondary | ICD-10-CM | POA: Diagnosis not present

## 2016-02-29 DIAGNOSIS — S069X9A Unspecified intracranial injury with loss of consciousness of unspecified duration, initial encounter: Secondary | ICD-10-CM | POA: Diagnosis not present

## 2016-02-29 DIAGNOSIS — E876 Hypokalemia: Secondary | ICD-10-CM | POA: Diagnosis present

## 2016-02-29 DIAGNOSIS — M546 Pain in thoracic spine: Secondary | ICD-10-CM | POA: Diagnosis not present

## 2016-02-29 DIAGNOSIS — S060X9A Concussion with loss of consciousness of unspecified duration, initial encounter: Secondary | ICD-10-CM

## 2016-02-29 DIAGNOSIS — E1165 Type 2 diabetes mellitus with hyperglycemia: Secondary | ICD-10-CM | POA: Diagnosis present

## 2016-02-29 DIAGNOSIS — Z8673 Personal history of transient ischemic attack (TIA), and cerebral infarction without residual deficits: Secondary | ICD-10-CM

## 2016-02-29 DIAGNOSIS — E785 Hyperlipidemia, unspecified: Secondary | ICD-10-CM | POA: Diagnosis present

## 2016-02-29 DIAGNOSIS — M25512 Pain in left shoulder: Secondary | ICD-10-CM | POA: Diagnosis not present

## 2016-02-29 DIAGNOSIS — I2510 Atherosclerotic heart disease of native coronary artery without angina pectoris: Secondary | ICD-10-CM | POA: Diagnosis present

## 2016-02-29 DIAGNOSIS — S0990XA Unspecified injury of head, initial encounter: Secondary | ICD-10-CM

## 2016-02-29 DIAGNOSIS — S299XXA Unspecified injury of thorax, initial encounter: Secondary | ICD-10-CM | POA: Diagnosis not present

## 2016-02-29 DIAGNOSIS — R079 Chest pain, unspecified: Secondary | ICD-10-CM | POA: Diagnosis not present

## 2016-02-29 DIAGNOSIS — Z86718 Personal history of other venous thrombosis and embolism: Secondary | ICD-10-CM

## 2016-02-29 DIAGNOSIS — H8110 Benign paroxysmal vertigo, unspecified ear: Secondary | ICD-10-CM | POA: Diagnosis present

## 2016-02-29 DIAGNOSIS — E66811 Obesity, class 1: Secondary | ICD-10-CM | POA: Diagnosis present

## 2016-02-29 DIAGNOSIS — E118 Type 2 diabetes mellitus with unspecified complications: Secondary | ICD-10-CM

## 2016-02-29 DIAGNOSIS — S199XXA Unspecified injury of neck, initial encounter: Secondary | ICD-10-CM | POA: Diagnosis not present

## 2016-02-29 DIAGNOSIS — M542 Cervicalgia: Secondary | ICD-10-CM | POA: Diagnosis not present

## 2016-02-29 HISTORY — DX: Gastro-esophageal reflux disease without esophagitis: K21.9

## 2016-02-29 HISTORY — DX: Acute kidney failure, unspecified: N17.9

## 2016-02-29 HISTORY — DX: Syncope and collapse: R55

## 2016-02-29 LAB — PROTIME-INR
INR: 1.16
PROTHROMBIN TIME: 14.9 s (ref 11.4–15.2)

## 2016-02-29 LAB — HEMOGLOBIN A1C
HEMOGLOBIN A1C: 8.6 % — AB (ref 4.8–5.6)
MEAN PLASMA GLUCOSE: 200 mg/dL

## 2016-02-29 LAB — CBG MONITORING, ED
Glucose-Capillary: 275 mg/dL — ABNORMAL HIGH (ref 65–99)
Glucose-Capillary: 189 mg/dL — ABNORMAL HIGH (ref 65–99)
Glucose-Capillary: 204 mg/dL — ABNORMAL HIGH (ref 65–99)
Glucose-Capillary: 195 mg/dL — ABNORMAL HIGH (ref 65–99)

## 2016-02-29 LAB — I-STAT TROPONIN, ED: Troponin i, poc: 0.04 ng/mL (ref 0.00–0.08)

## 2016-02-29 LAB — CK: CK TOTAL: 53 U/L (ref 38–234)

## 2016-02-29 LAB — GLUCOSE, CAPILLARY
GLUCOSE-CAPILLARY: 269 mg/dL — AB (ref 65–99)
GLUCOSE-CAPILLARY: 141 mg/dL — AB (ref 65–99)

## 2016-02-29 LAB — D-DIMER, QUANTITATIVE (NOT AT ARMC): D DIMER QUANT: 1.42 ug{FEU}/mL — AB (ref 0.00–0.50)

## 2016-02-29 MED ORDER — INSULIN ASPART 100 UNIT/ML ~~LOC~~ SOLN
0.0000 [IU] | Freq: Three times a day (TID) | SUBCUTANEOUS | Status: DC
Start: 2016-02-29 — End: 2016-03-02
  Administered 2016-02-29: 3 [IU] via SUBCUTANEOUS
  Administered 2016-02-29: 8 [IU] via SUBCUTANEOUS
  Administered 2016-02-29: 3 [IU] via SUBCUTANEOUS
  Administered 2016-03-01: 5 [IU] via SUBCUTANEOUS
  Administered 2016-03-01 – 2016-03-02 (×3): 3 [IU] via SUBCUTANEOUS
  Administered 2016-03-02: 11 [IU] via SUBCUTANEOUS
  Filled 2016-02-29 (×2): qty 1

## 2016-02-29 MED ORDER — SODIUM CHLORIDE 0.9 % IV SOLN
INTRAVENOUS | Status: AC
  Administered 2016-02-29: 08:00:00 via INTRAVENOUS

## 2016-02-29 MED ORDER — SODIUM CHLORIDE 0.9 % IV BOLUS (SEPSIS): 500.0000 mL | Freq: Once | INTRAVENOUS | Status: DC

## 2016-02-29 MED ORDER — IOPAMIDOL (ISOVUE-370) INJECTION 76%
INTRAVENOUS | Status: AC
  Administered 2016-02-29: 80 mL
  Filled 2016-02-29: qty 100

## 2016-02-29 MED ORDER — HYDRALAZINE HCL 20 MG/ML IJ SOLN
10.0000 mg | Freq: Four times a day (QID) | INTRAMUSCULAR | Status: DC | PRN
  Filled 2016-02-29: qty 1

## 2016-02-29 MED ORDER — ENOXAPARIN SODIUM 40 MG/0.4ML ~~LOC~~ SOLN
40.0000 mg | SUBCUTANEOUS | Status: DC
  Administered 2016-02-29 – 2016-03-01 (×2): 40 mg via SUBCUTANEOUS
  Filled 2016-02-29 (×2): qty 0.4

## 2016-02-29 MED ORDER — INSULIN ASPART 100 UNIT/ML ~~LOC~~ SOLN
0.0000 [IU] | Freq: Every day | SUBCUTANEOUS | Status: DC
Start: 2016-02-29 — End: 2016-03-02
  Administered 2016-03-01: 2 [IU] via SUBCUTANEOUS

## 2016-02-29 MED ORDER — HYDROCHLOROTHIAZIDE 12.5 MG PO CAPS
12.5000 mg | ORAL_CAPSULE | Freq: Once | ORAL | Status: AC
  Administered 2016-02-29: 12.5 mg via ORAL
  Filled 2016-02-29: qty 1

## 2016-02-29 MED ORDER — TRAMADOL HCL 50 MG PO TABS
50.0000 mg | ORAL_TABLET | Freq: Four times a day (QID) | ORAL | Status: DC | PRN
  Administered 2016-02-29: 50 mg via ORAL
  Filled 2016-02-29: qty 1

## 2016-02-29 MED ORDER — MECLIZINE HCL 25 MG PO TABS: 25.0000 mg | ORAL_TABLET | Freq: Three times a day (TID) | ORAL | Status: DC | PRN

## 2016-02-29 MED ORDER — GLIMEPIRIDE 4 MG PO TABS
4.0000 mg | ORAL_TABLET | Freq: Every day | ORAL | Status: DC
  Administered 2016-02-29 – 2016-03-02 (×3): 4 mg via ORAL
  Filled 2016-02-29 (×3): qty 1

## 2016-02-29 MED ORDER — SODIUM CHLORIDE 0.9% FLUSH
3.0000 mL | Freq: Two times a day (BID) | INTRAVENOUS | Status: DC
  Administered 2016-03-01 – 2016-03-02 (×3): 3 mL via INTRAVENOUS

## 2016-02-29 MED ORDER — SIMVASTATIN 20 MG PO TABS
20.0000 mg | ORAL_TABLET | Freq: Every day | ORAL | Status: DC
  Administered 2016-02-29 – 2016-03-02 (×3): 20 mg via ORAL
  Filled 2016-02-29 (×3): qty 1

## 2016-02-29 MED ORDER — LOSARTAN POTASSIUM 50 MG PO TABS
100.0000 mg | ORAL_TABLET | Freq: Once | ORAL | Status: AC
  Administered 2016-02-29: 100 mg via ORAL
  Filled 2016-02-29: qty 2

## 2016-02-29 MED ORDER — POTASSIUM CHLORIDE CRYS ER 20 MEQ PO TBCR
40.0000 meq | EXTENDED_RELEASE_TABLET | Freq: Once | ORAL | Status: AC
  Administered 2016-02-29: 40 meq via ORAL
  Filled 2016-02-29: qty 2

## 2016-02-29 MED ORDER — ACETAMINOPHEN 500 MG PO TABS
1000.0000 mg | ORAL_TABLET | Freq: Once | ORAL | Status: AC
  Administered 2016-02-29: 1000 mg via ORAL
  Filled 2016-02-29: qty 2

## 2016-02-29 MED ORDER — SODIUM CHLORIDE 0.9 % IV BOLUS (SEPSIS)
1000.0000 mL | Freq: Once | INTRAVENOUS | Status: AC
  Administered 2016-02-29: 1000 mL via INTRAVENOUS

## 2016-02-29 MED ORDER — AMLODIPINE BESYLATE 5 MG PO TABS
5.0000 mg | ORAL_TABLET | Freq: Once | ORAL | Status: AC
  Administered 2016-02-29: 5 mg via ORAL
  Filled 2016-02-29: qty 1

## 2016-02-29 NOTE — ED Notes (Signed)
Pt taken to MRI  

## 2016-02-29 NOTE — Progress Notes (Signed)
Kristen [name]'s 75 year old female with past medical history of HTN, HLD, DM type II, CVA, DVT; who comes with complaints of having syncopal event while at home. Associated symptoms of nausea, SOB, and diaphoresis prior to event. WBC 14.7, potassium 3.2, BUN 32, creatinine 1.4. Patient appears to be acutely dehydrated, but orthostatics reported negative. Troponins negative 2. CT angiogram chest negative for PE. Admitted for further monitoring and workup to a telemetry bed.

## 2016-02-29 NOTE — ED Notes (Signed)
Pt's lunch tray arrived. 

## 2016-02-29 NOTE — H&P (Signed)
History and Physical    OMELIA MARQUART HER:[id number] DOB: [birth year] DOA: 02/29/2016   PCP: [name], MD   Patient coming from/Resides with: Private residence/lives alone but occasionally adult son resides at her home  Admission status: Observation/telemetry -need to reevaluate in 24 hours to determine if that will be medically necessary to stay a minimum 2 midnights to rule out impending and/or unexpected changes in physiologic status that may differ from initial evaluation performed in the ER and/or at time of admission. Presents with syncopal episode so stated this equilibrium with currently uncontrolled blood pressure, acute kidney injury and mild dehydration and history of BPV. She will initially require short-term IV fluids with follow-up chemistries. She will require at least one physical therapy for vestibular evaluation and given uncontrolled blood pressure will obtain MRI of head to rule out atypical presentation of stroke.  Chief Complaint: Syncope and fall  HPI: NICO ROGNESS is a 75 y.o. female with medical history significant for diabetes mellitus on oral agents, obesity, dyslipidemia, hypertension, history of DVT previously on anticoagulation, remote history of CVA, and known coronary calcifications. Patient reports that for several days she has felt somewhat weak and faint without true dizziness. She describes some of these sensations as" standing and felt like she was falling" without actually falling. She had a very severe episode last night and decided to lay down on her bed. She got up to go to the bathroom and before she can get into the bathroom she fell violently against the door jam. Although she had her phone with her she had fallen on top of her phone. At some point she was able to crawl back to her bed and lay down and her symptoms continued. She describes this as feeling "disoriented". Prior to onset of the syncopal episode she did not have any chest pain,  palpitations, visual disturbances. She was not incontinent she did notice that when she came to she was quite diaphoretic. Since following she has had discomfort in her left shoulder.  ED Course:  Vital Signs: BP 185/68   Pulse (!) 51   Temp 98.2 F (36.8 C) (Oral)   Resp 13   Ht '5\' 2"'  (1.575 m)   Wt 95.3 kg (210 lb)   SpO2 99%   BMI 38.41 kg/m  DG left shoulder and thoracic spine without acute injury CT head without contrast, CT cervical spine without contrast: Patchy low attenuation changes in the deep white matter consistent with small vessel ischemic change without acute infarction hemorrhage or hydrocephalus, no mass lesion or mass effect. No acute cervical spine injuries. CT angiography chest PE with an without contrast: No acute PE Lab data: Sodium 135, potassium 3.2, chloride 100, CO2 26, BUN 32, creatinine 1.4, glucose 351, anion gap 9, poc troponin negative 2, white count 14,700 differential not obtained, hemoglobin 12.2, platelets 288,000, d-dimer 1.42, coags normal, urinalysis consistent with UTI but does have few bacteria, hyaline casts, 250 glucose, no leukocytes noted ketones no nitrite and WBC 0-5 Medications and treatments: Normal saline bolus 1 L, Tylenol 1 g 1, Cozaar 100 mg 1, hydrochlorothiazide 12.5 mg 1, Norvasc 5 mg 1  Review of Systems:  In addition to the HPI above,  No Fever-chills, myalgias or other constitutional symptoms No Headache, changes with Vision or hearing, new focal weakness, tingling, numbness in any extremity, dizziness although described this equilibrium and possible vertiginous symptoms, dysarthria or word finding difficulty, gait disturbance or imbalance, tremors or seizure activity No problems swallowing food or  Liquids, indigestion/reflux, choking or coughing while eating, abdominal pain with or after eating No Chest pain, Cough or Shortness of Breath, palpitations, orthopnea or DOE No Abdominal pain, N/V, melena,hematochezia, dark tarry  stools No dysuria, malodorous urine, hematuria or flank pain No new skin rashes, lesions, masses or bruises, No new joint pains, aches, swelling or redness No recent unintentional weight gain or loss No polyuria, polydypsia or polyphagia   Past Medical History:  Diagnosis Date  . Abdominal aortic atherosclerosis ([place]) by CT 02/2014  . CAD (coronary artery disease)    by CT, per pt h/o MI  . Diabetes type 2, uncontrolled ([place])   . Frequent headaches   . History of pulmonary embolism 2012  . HLD (hyperlipidemia)   . HTN (hypertension)   . 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 Morbid obesity ([place])   . Myocardial infarction 2012   per pt. report, states she was treated with medicine, [facility] at [facility]    . Osteoarthritis    knees  . Primary localized osteoarthritis of left knee 09/22/2014  . Sleep apnea 2011   study done in [place], states that since she lost weight she doesn't use the CPAP any longer & she doesn't have a problem with sleep apnea  . Stroke [facility])    still has balance problem on occas. , uses cane but that's mainly for the left knee pain  . Thoracic aortic atherosclerosis ([place]) 12/2015   by CXR  . Vertigo    hx. benign postitional postural    Past Surgical History:  Procedure Laterality Date  . CATARACT EXTRACTION Bilateral 2013  . EYE SURGERY     /w IOL  . FOOT SURGERY Right   . PARTIAL HYSTERECTOMY     for fibroids, ovaries remain  . TONSILLECTOMY    . TOTAL KNEE ARTHROPLASTY Right 1990s  . TOTAL KNEE ARTHROPLASTY Left 09/22/2014   [name], MD  . TUBAL LIGATION      Social History   Social History  . Marital status: Widowed    Spouse name: N/A  . Number of children: N/A  . Years of education: N/A   Occupational History  . Not on file.   Social History Main Topics  . Smoking status: Never Smoker  . Smokeless tobacco: Never Used     Comment: tobacco use-  no   . Alcohol use 0.0 oz/week     Comment: Rare  . Drug use: No  . Sexual activity: Not on file   Other Topics Concern  . Not on file   Social History Narrative   Lives alone   4 grown children   Education: 11th grade   Occupation: retired, was Scientist, water quality   Activity: Does not regularly exercise.    Diet: good water, fruits/vegetables seldom    Mobility: Utilizes a 4 pronged cane Work history: Not obtained   Allergies  Allergen Reactions  . Acetaminophen Other (See Comments)    "Causes me to spit up blood"  . Aleve [Naproxen Sodium] Other (See Comments)    Spits up blood  . Doxycycline Other (See Comments)    Malaise, GI upset, "felt drunk" and very ill  . Metformin And Related Other (See Comments)    Chills, dizziness  . Penicillins Rash    Has patient had a PCN reaction causing immediate rash, facial/tongue/throat swelling, SOB or lightheadedness with hypotension: Yes Has patient had  a PCN reaction causing severe rash involving mucus membranes or skin necrosis: No Has patient had a PCN reaction that required hospitalization No Has patient had a PCN reaction occurring within the last 10 years: No If all of the above answers are "NO", then may proceed with Cephalosporin use.     Family History  Problem Relation Age of Onset  . Cancer Mother     bone  . Diabetes Father   . Hypertension Father   . Cancer Son 31    lung  . Congenital heart disease Son 47     Prior to Admission medications   Medication Sig Start Date End Date Taking? Authorizing Provider  colchicine 0.6 MG tablet Take 1 tablet (0.6 mg total) by mouth daily. First day take 2 tablets 02/07/16 02/06/17 Yes [name], MD  glimepiride (AMARYL) 4 MG tablet Take 4 mg by mouth daily with breakfast.   Yes Historical Provider, MD  losartan-hydrochlorothiazide (HYZAAR) 100-12.5 MG tablet Take 1 tablet by mouth daily. 06/29/15  Yes [name], MD  nitroGLYCERIN (NITROSTAT) 0.4 MG SL  tablet Place 0.4 mg under the tongue every 5 (five) minutes as needed for chest pain.   Yes Historical Provider, MD  simvastatin (ZOCOR) 20 MG tablet Take 20 mg by mouth daily.   Yes Historical Provider, MD  traMADol (ULTRAM) 50 MG tablet Take 1 tablet (50 mg total) by mouth 2 (two) times daily as needed. 02/10/16  Yes [name], MD  warfarin (COUMADIN) 2.5 MG tablet Take 2.5 mg by mouth daily. 02/17/16  Yes Historical Provider, MD  amLODipine (NORVASC) 5 MG tablet TAKE 1 TABLET (5 MG TOTAL) BY MOUTH DAILY. Patient not taking: Reported on 02/29/2016 03/10/15   [name], MD  baclofen (LIORESAL) 10 MG tablet Take 1 tablet (10 mg total) by mouth 3 (three) times daily. As needed for muscle spasm Patient not taking: Reported on 02/29/2016 09/22/14   [name], MD  Blood Glucose Monitoring Suppl (ONE TOUCH ULTRA SYSTEM KIT) w/Device KIT 1 kit by Does not apply route once. 09/17/15   [name], MD  glucose blood test strip Ck blood sugar twice a day and as directed. 09/17/15   [name], MD  [facility] DELICA LANCETS 00X MISC Check blood sugar twice a day and as directed. 09/17/15   [name], MD  predniSONE (DELTASONE) 20 MG tablet Take two tablets daily for 3 days followed by one tablet daily for 4 days Patient not taking: Reported on 02/29/2016 02/21/16   [name], MD  sitaGLIPtin (JANUVIA) 100 MG tablet Take 1 tablet (100 mg total) by mouth daily. Patient not taking: Reported on 02/29/2016 07/01/15   [name], MD    Physical Exam: Vitals:   02/29/16 0630 02/29/16 0645 02/29/16 0700 02/29/16 0715  BP: 198/55 185/66 187/65 185/68  Pulse: (!) 54 (!) 58 (!) 58 (!) 51  Resp: '12 15 11 13  ' Temp:      TempSrc:      SpO2: 100% 98% 99% 99%  Weight:      Height:          Constitutional: NAD, calm, comfortable Eyes: PERRL, lids and conjunctivae normal ENMT: Mucous membranes are moist. Posterior pharynx clear of any exudate or lesions.Normal dentition.    Neck: normal, supple, no masses, no thyromegaly Respiratory: clear to auscultation bilaterally, no wheezing, no crackles. Normal respiratory effort. No accessory muscle use.  Cardiovascular: Regular rate and rhythm, no murmurs / rubs / gallops. No extremity edema. 2+ pedal pulses.  No carotid bruits.  Abdomen: no tenderness, no masses palpated. No hepatosplenomegaly. Bowel sounds positive.  Musculoskeletal: no clubbing / cyanosis. No joint deformity upper and lower extremities. Good ROM, no contractures. Normal muscle tone.  Skin: no rashes, lesions, ulcers. No induration Neurologic: CN 2-12 grossly intact-nystagmus not reproduced with testing of EOMs but patient reported mild reproduction of disequilibrium symptoms and fuzzy headedness during testing. Sensation intact, DTR normal. Strength 5/5 x all 4 extremities.  Psychiatric: Normal judgment and insight. Alert and oriented x 3. Normal mood.    Labs on Admission: I have personally reviewed following labs and imaging studies  CBC:  Recent Labs Lab 02/28/16 2203  WBC 14.7*  HGB 12.2  HCT 38.0  MCV 81.0  PLT 502   Basic Metabolic Panel:  Recent Labs Lab 02/28/16 2203  NA 135  K 3.2*  CL 100*  CO2 26  GLUCOSE 351*  BUN 32*  CREATININE 1.40*  CALCIUM 9.3   GFR: Estimated Creatinine Clearance: 38 mL/min (by C-G formula based on SCr of 1.4 mg/dL (H)). Liver Function Tests: No results for input(s): AST, ALT, ALKPHOS, BILITOT, PROT, ALBUMIN in the last 168 hours. No results for input(s): LIPASE, AMYLASE in the last 168 hours. No results for input(s): AMMONIA in the last 168 hours. Coagulation Profile:  Recent Labs Lab 02/28/16 2205  INR 1.16   Cardiac Enzymes: No results for input(s): CKTOTAL, CKMB, CKMBINDEX, TROPONINI in the last 168 hours. BNP (last 3 results) No results for input(s): PROBNP in the last 8760 hours. HbA1C: No results for input(s): HGBA1C in the last 72 hours. CBG:  Recent Labs Lab 02/28/16 2141  02/29/16 0208  GLUCAP 314* 275*   Lipid Profile: No results for input(s): CHOL, HDL, LDLCALC, TRIG, CHOLHDL, LDLDIRECT in the last 72 hours. Thyroid Function Tests: No results for input(s): TSH, T4TOTAL, FREET4, T3FREE, THYROIDAB in the last 72 hours. Anemia Panel: No results for input(s): VITAMINB12, FOLATE, FERRITIN, TIBC, IRON, RETICCTPCT in the last 72 hours. Urine analysis:    Component Value Date/Time   COLORURINE YELLOW 02/28/2016 2152   APPEARANCEUR CLEAR 02/28/2016 2152   APPEARANCEUR Clear 03/10/2014 1233   LABSPEC 1.025 02/28/2016 2152   LABSPEC 1.017 03/10/2014 1233   PHURINE 5.5 02/28/2016 2152   GLUCOSEU 250 (A) 02/28/2016 2152   GLUCOSEU Negative 03/10/2014 1233   HGBUR TRACE (A) 02/28/2016 2152   BILIRUBINUR NEGATIVE 02/28/2016 2152   BILIRUBINUR Negative 04/01/2014 1102   BILIRUBINUR Negative 03/10/2014 [place] 02/28/2016 2152   PROTEINUR NEGATIVE 02/28/2016 2152   UROBILINOGEN 0.2 04/01/2014 1102   UROBILINOGEN 1.0 04/24/2008 2109   NITRITE NEGATIVE 02/28/2016 2152   LEUKOCYTESUR NEGATIVE 02/28/2016 2152   LEUKOCYTESUR Negative 03/10/2014 1233   Sepsis Labs: '@LABRCNTIP' (procalcitonin:4,lacticidven:4) )No results found for this or any previous visit (from the past 240 hour(s)).   Radiological Exams on Admission: Dg Chest 2 View  Result Date: 02/29/2016 CLINICAL DATA:  Pain after fall EXAM: CHEST  2 VIEW COMPARISON:  CXR 12/31/2015 FINDINGS: The heart size and mediastinal contours are within normal limits. Both lungs are clear. No pleural effusion, CHF nor pneumothorax. Stable thoracic spondylosis and bilateral glenohumeral joint osteoarthritis. IMPRESSION: No active cardiopulmonary disease. Electronically Signed   By: Ashley Royalty M.D.   On: 02/29/2016 03:59   Dg Thoracic Spine 2 View  Result Date: 02/29/2016 CLINICAL DATA:  Pain after fall EXAM: THORACIC SPINE 2 VIEWS COMPARISON:  12/31/2015 chest radiograph FINDINGS: There is no evidence  of acute thoracic spine fracture. Thoracic spondylosis  with multilevel degenerative disc disease, most prominent from T6 through T8 appears stable. Multilevel osteophytes are noted, similar to the lateral chest radiograph from 12/31/2015. Alignment is normal. No other significant bone abnormalities are identified. IMPRESSION: Thoracic spondylosis without acute osseous abnormality. Electronically Signed   By: Ashley Royalty M.D.   On: 02/29/2016 03:57   Ct Head Wo Contrast  Result Date: 02/29/2016 CLINICAL DATA:  Syncope with head injury. Patient became dizzy and fell while walking out of the bathroom. Pain to the left side and neck. EXAM: CT HEAD WITHOUT CONTRAST CT CERVICAL SPINE WITHOUT CONTRAST TECHNIQUE: Multidetector CT imaging of the head and cervical spine was performed following the standard protocol without intravenous contrast. Multiplanar CT image reconstructions of the cervical spine were also generated. COMPARISON:  CT head and cervical spine 06/22/2007 FINDINGS: CT HEAD FINDINGS Brain: Patchy low-attenuation changes in the deep white matter likely representing small vessel ischemic change. No evidence of acute infarction, hemorrhage, hydrocephalus, extra-axial collection or mass lesion/mass effect. Vascular: Vascular calcifications are present. Skull: Normal. Negative for fracture or focal lesion. Sinuses/Orbits: No acute finding. Other: Congenital nonunion of posterior arch of C1. CT CERVICAL SPINE FINDINGS Alignment: Normal. Skull base and vertebrae: No acute fracture. No primary bone lesion or focal pathologic process. Congenital nonunion of posterior arch of C1. Soft tissues and spinal canal: No prevertebral fluid or swelling. No visible canal hematoma. Disc levels: Diffuse degenerative change throughout the cervical spine with narrowed interspaces and endplate hypertrophic changes throughout. Degenerative changes in the facet joints. Uncovertebral spurring. Upper chest: Vascular calcifications.   Lung apices are clear. Other: None. IMPRESSION: No acute intracranial abnormalities. Mild small vessel ischemic change in the deep white matter. Normal alignment of the cervical spine. Diffuse degenerative change. No acute displaced fractures identified. Electronically Signed   By: Lucienne Capers M.D.   On: 02/29/2016 04:08   Ct Angio Chest Pe W And/or Wo Contrast  Result Date: 02/29/2016 CLINICAL DATA:  Dizziness, syncope, and fall yesterday. Nausea and weakness. Elevated D-dimer. Shortness of breath. History of DVT. EXAM: CT ANGIOGRAPHY CHEST WITH CONTRAST TECHNIQUE: Multidetector CT imaging of the chest was performed using the standard protocol during bolus administration of intravenous contrast. Multiplanar CT image reconstructions and MIPs were obtained to evaluate the vascular anatomy. CONTRAST:  74 mL Isovue 370 COMPARISON:  07/02/2013 FINDINGS: Cardiovascular: Satisfactory opacification of the pulmonary arteries to the segmental level. No evidence of pulmonary embolism. Normal heart size. No pericardial effusion. Coronary artery calcifications. Mediastinum/Nodes: No enlarged mediastinal, hilar, or axillary lymph nodes. Thyroid gland, trachea, and esophagus demonstrate no significant findings. Lungs/Pleura: Lungs are clear. No pleural effusion or pneumothorax. Upper Abdomen: No acute abnormality. Musculoskeletal: Degenerative changes in the spine. Review of the MIP images confirms the above findings. IMPRESSION: No evidence of significant pulmonary embolus. No evidence of active pulmonary disease. Electronically Signed   By: Lucienne Capers M.D.   On: 02/29/2016 05:29   Ct Cervical Spine Wo Contrast  Result Date: 02/29/2016 CLINICAL DATA:  Syncope with head injury. Patient became dizzy and fell while walking out of the bathroom. Pain to the left side and neck. EXAM: CT HEAD WITHOUT CONTRAST CT CERVICAL SPINE WITHOUT CONTRAST TECHNIQUE: Multidetector CT imaging of the head and cervical spine was  performed following the standard protocol without intravenous contrast. Multiplanar CT image reconstructions of the cervical spine were also generated. COMPARISON:  CT head and cervical spine 06/22/2007 FINDINGS: CT HEAD FINDINGS Brain: Patchy low-attenuation changes in the deep white matter likely representing small vessel  ischemic change. No evidence of acute infarction, hemorrhage, hydrocephalus, extra-axial collection or mass lesion/mass effect. Vascular: Vascular calcifications are present. Skull: Normal. Negative for fracture or focal lesion. Sinuses/Orbits: No acute finding. Other: Congenital nonunion of posterior arch of C1. CT CERVICAL SPINE FINDINGS Alignment: Normal. Skull base and vertebrae: No acute fracture. No primary bone lesion or focal pathologic process. Congenital nonunion of posterior arch of C1. Soft tissues and spinal canal: No prevertebral fluid or swelling. No visible canal hematoma. Disc levels: Diffuse degenerative change throughout the cervical spine with narrowed interspaces and endplate hypertrophic changes throughout. Degenerative changes in the facet joints. Uncovertebral spurring. Upper chest: Vascular calcifications.  Lung apices are clear. Other: None. IMPRESSION: No acute intracranial abnormalities. Mild small vessel ischemic change in the deep white matter. Normal alignment of the cervical spine. Diffuse degenerative change. No acute displaced fractures identified. Electronically Signed   By: Lucienne Capers M.D.   On: 02/29/2016 04:08   Dg Shoulder Left  Result Date: 02/29/2016 CLINICAL DATA:  Patient passed out earlier landing on left side of body. Left shoulder pain. EXAM: LEFT SHOULDER - 2+ VIEW COMPARISON:  None. FINDINGS: There is no evidence of fracture or dislocation. Mild undersurface spurring of the acromion is suggested on the AP view. Soft tissues are unremarkable. The visualized ribs and lung are unremarkable. IMPRESSION: No acute osseous abnormality of the left  shoulder. Electronically Signed   By: Ashley Royalty M.D.   On: 02/29/2016 03:54    EKG: (Independently reviewed) normal sinus rhythm with ventricular rate 67 bpm, QTC 401 ms, voltage criteria met for LVH no definitive acute ischemic changes  Assessment/Plan Principal Problem:   Syncope -Patient presents after syncopal episode home followed by a fall with persistent description of "fuzzy headedness" consistent with likely acute concussion -No focal neurological deficits but given descriptive consistent with disequilibrium all check stat MRI brain to rule out atypical presentation of stroke (patient with history of prior CVA) especially given current poorly controlled hypertension -Initiate syncope admission orders -Echocardiogram -Was not orthostatic but labs sent with dehydration and patient was on diuretic prior to admission; was given 1 L IV fluids in ER and will give normal saline at 50 mL per hour for 24 hours  Active Problems:   Hypertension, uncontrolled -Current blood pressure poorly controlled-possibly related to activation of renin-angiotensin in setting of acute kidney injury -Was given Norvasc as well as losartan and hydrochlorothiazide by the EDP -Holding diuretic and ARB in setting AKI -prn IV hydralazine -If MRI positive for stroke will need to allow for permissive hypertension    Benign paroxysmal positional vertigo -Patient describes disequilibrium and a sensation that she is falling forward when she is actually standing up; was also occurs when she attempts to sit up in the bed positions and occurred just prior to syncopal episode and has been ongoing since syncope episode -Follow-up on MRI to rule out cerebellar stroke -prn meclizine -PT vestibular exam on 11/8    Acute kidney injury  -Baseline renal function: 15/0.85 -Current renal function: 32/1.4 -Suspect recent utilization of colchicine and suspected dehydration from hyperglycemia as well as thiazide diuretic  contributing -Was given IV contrast for CT angiogram while in ER therefore will continue gentle IV fluid hydration at 50 mL per hour 24 hours -Repeat chemistries in a.m. -Patient reports apparently was down for about 1 hour so we'll check CK    Diabetes type 2, uncontrolled  -Patient only takes Amaryl for diabetes is unclear why Januvia was stopped -Does not  check CBGs frequently at home -Check hemoglobin A1c -Check CBGs and provide SSI -If A1c elevated may benefit from diabetes educator consultation    CAD (coronary artery disease)/coronary calcifications -No chest pain or palpitations prior to onset of syncopal episode and EKG unremarkable -Troponins 2 negative so no further indication to cycle    Acute hypokalemia -Likely related to preadmission utilization of thiazide diuretic -Replace and follow chemistries    Leukocytosis -Likely related to degree of dehydration and recent fall -No fevers or other sources of infection identified -Repeat CBC in a.m.    History of DVT (deep vein thrombosis) -D-dimer was elevated but CT angiogram of chest was negative for PE -No unilateral lower extremity edema identified on physical exam    HLD (hyperlipidemia) -Continue preadmission Zocor but follow-up on CK in the event this medication needs to be temporarily held    History of cerebrovascular accident (CVA) -No focal neurological deficits on exam but given disequilibrium MRI of brain has been obtained rule out atypical presentation stroke event    Obesity, Class II, BMI 35-39.9, with comorbidity  -Defer to PCP regarding weight reduction strategies      DVT prophylaxis: Lovenox Code Status: Full Family Communication: No family at bedside Disposition Plan: Anticipate discharge back to preadmission home environment once medically stable Consults called: None but if MRI returns positive for stroke event will need to formally consult neurology    ELLIS,ALLISON L. ANP-BC [facility] Pager [phone]   If 7PM-7AM, please contact night-coverage [email or web address] Password [email or web address]  02/29/2016, 7:50 AM

## 2016-02-29 NOTE — ED Notes (Signed)
CBG is 275.

## 2016-02-29 NOTE — ED Notes (Signed)
Ambulated to restroom  

## 2016-02-29 NOTE — ED Notes (Signed)
Pt taken to CT.

## 2016-02-29 NOTE — ED Provider Notes (Signed)
TIME SEEN:  By signing my name below, I, Arianna Nassar, attest that this documentation has been prepared under the direction and in the presence of [employer], DO.  Electronically Signed: Julien Nordmann, ED Scribe. 02/29/16. 3:02 AM.   CHIEF COMPLAINT:  Chief Complaint  Patient presents with  . Loss of Consciousness  . Head Injury     HPI:  HPI Comments: Kristen [name] is a 75 y.o. female who has a PMhx of HTN, HLD, DM2, CAD, MI, CVA who presents to the Emergency Department presenting with a head injury s/p a fall that occurred earlier today after a syncopal episode. Associated left shoulder soreness, upper back pain, dizziness (light-headedness), nausea, and weakness. She expresses that she has not felt well all day. Pt says that she was leaving the bathroom when she lost consciousness and hit the left side of her head very hard on the side of the door. She reports feeling dizzy before losing consciousness. Pt reports when she came too, she felt short of breath and had diaphoresis. She expresses that she stayed on the ground for about 1 hour and had to crawl to her bed before standing. She says that her head feels very funny and put a "funny taste in her mouth". Pt states that her dizziness is worse when going from a sitting to standing, or lying down to sitting up, but per chart review pt has a hx of vertigo. Denies this feels the same.  States she has had and MI but she does not have any stents. Denies chest pain, numbness, tingling, Focal weakness. Pt is a non-smoker. States she has had a previous DVT. She thinks she may still be on Coumadin. PCP is Dr. [name].  ROS: See HPI Constitutional: no fever  Eyes: no drainage  ENT: no runny nose   Cardiovascular:  no chest pain  Resp:  SOB  GI: no vomiting or diarrhea GU: no dysuria Integumentary: no rash  Allergy: no hives  Musculoskeletal: no leg swelling  Neurological: no slurred speech, numbness or focal weakness ROS otherwise  negative  PAST MEDICAL HISTORY/PAST SURGICAL HISTORY:  Past Medical History:  Diagnosis Date  . Abdominal aortic atherosclerosis ([place]) by CT 02/2014  . CAD (coronary artery disease)    by CT, per pt h/o MI  . Diabetes type 2, uncontrolled ([place])   . Frequent headaches   . History of pulmonary embolism 2012  . HLD (hyperlipidemia)   . HTN (hypertension)   . 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 Morbid obesity ([place])   . Myocardial infarction 2012   per pt. report, states she was treated with medicine, here at [facility]    . Osteoarthritis    knees  . Primary localized osteoarthritis of left knee 09/22/2014  . Sleep apnea 2011   study done in [place], states that since she lost weight she doesn't use the CPAP any longer & she doesn't have a problem with sleep apnea  . Stroke [facility])    still has balance problem on occas. , uses cane but that's mainly for the left knee pain  . Thoracic aortic atherosclerosis ([place]) 12/2015   by CXR  . Vertigo    hx. benign postitional postural    MEDICATIONS:  Prior to Admission medications   Medication Sig Start Date End Date Taking? Authorizing Provider  colchicine 0.6 MG tablet Take 1 tablet (0.6 mg total) by  mouth daily. First day take 2 tablets 02/07/16 02/06/17 Yes [name], MD  glimepiride (AMARYL) 4 MG tablet Take 4 mg by mouth daily with breakfast.   Yes Historical Provider, MD  losartan-hydrochlorothiazide (HYZAAR) 100-12.5 MG tablet Take 1 tablet by mouth daily. 06/29/15  Yes [name], MD  nitroGLYCERIN (NITROSTAT) 0.4 MG SL tablet Place 0.4 mg under the tongue every 5 (five) minutes as needed for chest pain.   Yes Historical Provider, MD  simvastatin (ZOCOR) 20 MG tablet Take 20 mg by mouth daily.   Yes Historical Provider, MD  traMADol (ULTRAM) 50 MG tablet Take 1 tablet (50 mg total) by mouth 2 (two) times daily as needed. 02/10/16  Yes  [name], MD  warfarin (COUMADIN) 2.5 MG tablet Take 2.5 mg by mouth daily. 02/17/16  Yes Historical Provider, MD  amLODipine (NORVASC) 5 MG tablet TAKE 1 TABLET (5 MG TOTAL) BY MOUTH DAILY. Patient not taking: Reported on 02/29/2016 03/10/15   [name], MD  baclofen (LIORESAL) 10 MG tablet Take 1 tablet (10 mg total) by mouth 3 (three) times daily. As needed for muscle spasm Patient not taking: Reported on 02/29/2016 09/22/14   [name], MD  Blood Glucose Monitoring Suppl (ONE TOUCH ULTRA SYSTEM KIT) w/Device KIT 1 kit by Does not apply route once. 09/17/15   [name], MD  glucose blood test strip Ck blood sugar twice a day and as directed. 09/17/15   [name], MD  [facility] DELICA LANCETS 28B MISC Check blood sugar twice a day and as directed. 09/17/15   [name], MD  predniSONE (DELTASONE) 20 MG tablet Take two tablets daily for 3 days followed by one tablet daily for 4 days Patient not taking: Reported on 02/29/2016 02/21/16   [name], MD  sitaGLIPtin (JANUVIA) 100 MG tablet Take 1 tablet (100 mg total) by mouth daily. Patient not taking: Reported on 02/29/2016 07/01/15   [name], MD    ALLERGIES:  Allergies  Allergen Reactions  . Acetaminophen Other (See Comments)    "Causes me to spit up blood"  . Aleve [Naproxen Sodium] Other (See Comments)    Spits up blood  . Doxycycline Other (See Comments)    Malaise, GI upset, "felt drunk" and very ill  . Metformin And Related Other (See Comments)    Chills, dizziness  . Penicillins Rash    Has patient had a PCN reaction causing immediate rash, facial/tongue/throat swelling, SOB or lightheadedness with hypotension: Yes Has patient had a PCN reaction causing severe rash involving mucus membranes or skin necrosis: No Has patient had a PCN reaction that required hospitalization No Has patient had a PCN reaction occurring within the last 10 years: No If all of the above answers are "NO", then  may proceed with Cephalosporin use.     SOCIAL HISTORY:  Social History  Substance Use Topics  . Smoking status: Never Smoker  . Smokeless tobacco: Never Used     Comment: tobacco use- no   . Alcohol use 0.0 oz/week     Comment: Rare    FAMILY HISTORY: Family History  Problem Relation Age of Onset  . Cancer Mother     bone  . Diabetes Father   . Hypertension Father   . Cancer Son 31    lung  . Congenital heart disease Son 19    EXAM: BP 135/55 (BP Location: Right Arm)   Pulse 67   Temp 98.2 F (36.8 C) (Oral)   Resp 22   Ht  _0  (1.575 m)   Wt 210 lb (95.3 kg)   SpO2 99%   BMI 38.41 kg/m  CONSTITUTIONAL: Alert and oriented and responds appropriately to questions. Well-appearing; well-nourished; GCS 15; elderly, chronically ill appearing, non-toxic and afebrile HEAD: Normocephalic; atraumatic, Tender to palpation over the left scalp EYES: Conjunctivae clear, PERRL, EOMI ENT: normal nose; no rhinorrhea; moist mucous membranes; pharynx without lesions noted; no dental injury; no septal hematoma NECK: Supple, no meningismus, no LAD; no midline spinal tenderness, step-off or deformity, Mildly tender to palpation over the left lateral neck, trachea midline, no hematoma CARD: RRR; S1 and S2 appreciated; no murmurs, no clicks, no rubs, no gallops RESP: Normal chest excursion without splinting or tachypnea; breath sounds clear and equal bilaterally; no wheezes, no rhonchi, no rales; no hypoxia or respiratory distress CHEST:  chest wall stable, no crepitus or ecchymosis or deformity, nontender to palpation ABD/GI: Normal bowel sounds; non-distended; soft, non-tender, no rebound, no guarding PELVIS:  stable, nontender to palpation BACK:  The back appears normal and is non-tender to palpation, there is no CVA tenderness; no midline spinal tenderness, step-off or deformity EXT: Tender over the left shoulder without bony deformity, no loss of fullness of this joint, Normal ROM in  all joints; otherwise Ixodes are non-tender to palpation; no edema; normal capillary refill; no cyanosis, no bony tenderness or bony deformity of patient's extremities, no joint effusion, no ecchymosis or lacerations    SKIN: Normal color for age and race; warm NEURO: Moves all extremities equally, sensation to light touch intact diffusely, cranial nerves II through XII intact, No pronator drift, no dysarthria or aphasia PSYCH: The patient's mood and manner are appropriate. Grooming and personal hygiene are appropriate.  MEDICAL DECISION MAKING: Patient here with syncopal event. It does change with positions but she did have shortness of breath, diaphoresis and nausea after the episode. Complains of left-sided head pain, left neck pain and left shoulder pain after the fall. Also having thoracic back pain on exam. Neurologically intact. No chest pain currently. EKG shows no new ischemic abnormality. Labs show mild leukocytosis and slightly elevated creatinine. She was hyperglycemic without DKA but this has improved without intervention. Urine shows no sign of infection, no ketones. Will obtain CT of her head, cervical spine, x-rays of her chest, left shoulder and thoracic spine. We'll give Tylenol for pain per her request. Anticipate admission for syncopal event in a patient that had shortness of breath, nausea and diaphoresis.  ED PROGRESS: 4:40 AM  Patient's second troponin is negative. D-dimer is elevated. INR is normal, doesn't appear she is taking Coumadin that it is therapeutic. Will obtain a CT scan to rule out pulmonary embolus as the cause of her syncopal event and shortness of breath today.  CT of her head and cervical spine show no acute abnormality.  X-rays of her chest, thoracic spine and left shoulder show no acute injury either.   Orthostatic vital signs obtained prior to receiving IV fluids were negative.   5:30 AM  Pt's CT scan shows no pulmonary embolus. She is now mildly  hypertensive. We'll give her her home medications. Will discuss with medicine for admission for syncopal workup. She has not had an echocardiogram, stress test since 2015. No arrhythmia noted on the monitor. Still not having a chest pain but does complain of mild shortness of breath.  6:05 AM  Discussed patient's case with hospitalist, Dr. Tamala Julian.  Recommend admission to telemetry, observation bed.  I will place holding orders per their  request. Patient and family (if present) updated with plan. Care transferred to hospitalist service.  I reviewed all nursing notes, vitals, pertinent old records, EKGs, labs, imaging (as available).   EKG Interpretation  Date/Time:  Monday February 28 2016 21:20:31 EST Ventricular Rate:  67 PR Interval:  170 QRS Duration: 88 QT Interval:  380 QTC Calculation: 401 R Axis:   -19 Text Interpretation:  Normal sinus rhythm Left ventricular hypertrophy with repolarization abnormality Abnormal ECG No significant change since last tracing Confirmed by WARD,  DO, KRISTEN ([id number]) on 02/29/2016 2:51:22 AM       I personally performed the services described in this documentation, which was scribed in my presence. The recorded information has been reviewed and is accurate.    [place], DO 02/29/16 [phone]

## 2016-02-29 NOTE — ED Notes (Signed)
Patient transported to CT 

## 2016-02-29 NOTE — Progress Notes (Signed)
Patient arrived via stretcher to room 2W32 in no apparent distress.  Patient oriented to unit and room to include phone and call light.  Telemetry monitor is applied and CCMD notified.  Will continue to monitor.

## 2016-02-29 NOTE — ED Notes (Signed)
Meal tray ordered for pt  

## 2016-02-29 NOTE — ED Notes (Signed)
Report given to 2W

## 2016-03-01 ENCOUNTER — Encounter (HOSPITAL_COMMUNITY): Payer: Self-pay | Admitting: General Practice

## 2016-03-01 DIAGNOSIS — R55 Syncope and collapse: Secondary | ICD-10-CM

## 2016-03-01 HISTORY — DX: Syncope and collapse: R55

## 2016-03-01 LAB — GLUCOSE, CAPILLARY
GLUCOSE-CAPILLARY: 221 mg/dL — AB (ref 65–99)
Glucose-Capillary: 177 mg/dL — ABNORMAL HIGH (ref 65–99)
Glucose-Capillary: 197 mg/dL — ABNORMAL HIGH (ref 65–99)
Glucose-Capillary: 217 mg/dL — ABNORMAL HIGH (ref 65–99)

## 2016-03-01 LAB — CBC
HCT: 37.2 % (ref 36.0–46.0)
Hemoglobin: 11.6 g/dL — ABNORMAL LOW (ref 12.0–15.0)
MCH: 25.5 pg — ABNORMAL LOW (ref 26.0–34.0)
MCHC: 31.2 g/dL (ref 30.0–36.0)
MCV: 81.8 fL (ref 78.0–100.0)
PLATELETS: 284 10*3/uL (ref 150–400)
RBC: 4.55 MIL/uL (ref 3.87–5.11)
RDW: 16.4 % — AB (ref 11.5–15.5)
WBC: 10.9 10*3/uL — AB (ref 4.0–10.5)

## 2016-03-01 LAB — BASIC METABOLIC PANEL
Anion gap: 7 (ref 5–15)
BUN: 24 mg/dL — AB (ref 6–20)
CHLORIDE: 103 mmol/L (ref 101–111)
CO2: 26 mmol/L (ref 22–32)
CREATININE: 1.13 mg/dL — AB (ref 0.44–1.00)
Calcium: 8.7 mg/dL — ABNORMAL LOW (ref 8.9–10.3)
GFR calc Af Amer: 54 mL/min — ABNORMAL LOW (ref >60)
GFR calc non Af Amer: 47 mL/min — ABNORMAL LOW (ref >60)
GLUCOSE: 191 mg/dL — AB (ref 65–99)
Potassium: 3.7 mmol/L (ref 3.5–5.1)
SODIUM: 136 mmol/L (ref 135–145)

## 2016-03-01 MED ORDER — SODIUM CHLORIDE 0.9 % IV SOLN: INTRAVENOUS | Status: DC

## 2016-03-01 MED ORDER — AMLODIPINE BESYLATE 5 MG PO TABS
5.0000 mg | ORAL_TABLET | Freq: Every day | ORAL | Status: DC
  Administered 2016-03-01 – 2016-03-02 (×2): 5 mg via ORAL
  Filled 2016-03-01 (×2): qty 1

## 2016-03-01 NOTE — Progress Notes (Signed)
PROGRESS NOTE    Kristen [name]  [id number] DOB: January 08, 1941 DOA: 02/29/2016 PCP: [name], MD    Brief Narrative:  Kristen [name] is a 75 y.o. female with medical history significant for diabetes mellitus on oral agents, obesity, dyslipidemia, hypertension, history of DVT previously on anticoagulation, remote history of CVA, and known coronary calcifications. Patient reports that for several days she has felt somewhat weak and faint without true dizziness. She describes some of these sensations as" standing and felt like she was falling" without actually falling. She had a very severe episode last night and decided to lay down on her bed. She got up to go to the bathroom and before she can get into the bathroom she fell violently against the door jam. Although she had her phone with her she had fallen on top of her phone. At some point she was able to crawl back to her bed and lay down and her symptoms continued. She describes this as feeling "disoriented". Prior to onset of the syncopal episode she did not have any chest pain, palpitations, visual disturbances. She was not incontinent she did notice that when she came to she was quite diaphoretic. Since following she has had discomfort in her left shoulder.  ED Course:  Vital Signs: BP 185/68   Pulse (!) 51   Temp 98.2 F (36.8 C) (Oral)   Resp 13   Ht 5\' 2"  (1.575 m)   Wt 95.3 kg (210 lb)   SpO2 99%   BMI 38.41 kg/m  DG left shoulder and thoracic spine without acute injury CT head without contrast, CT cervical spine without contrast: Patchy low attenuation changes in the deep white matter consistent with small vessel ischemic change without acute infarction hemorrhage or hydrocephalus, no mass lesion or mass effect. No acute cervical spine injuries. CT angiography chest PE with an without contrast: No acute PE Lab data: Sodium 135, potassium 3.2, chloride 100, CO2 26, BUN 32, creatinine 1.4, glucose 351, anion gap 9, poc  troponin negative 2, white count 14,700 differential not obtained, hemoglobin 12.2, platelets 288,000, d-dimer 1.42, coags normal, urinalysis consistent with UTI but does have few bacteria, hyaline casts, 250 glucose, no leukocytes noted ketones no nitrite and WBC 0-5 Medications and treatments: Normal saline bolus 1 L, Tylenol 1 g 1, Cozaar 100 mg 1, hydrochlorothiazide 12.5 mg 1, Norvasc 5 mg 1   Assessment & Plan:   Principal Problem:   Syncope Active Problems:   Hypertension, uncontrolled   History of DVT (deep vein thrombosis)   HLD (hyperlipidemia)   Obesity, Class II, BMI 35-39.9, with comorbidity (HCC)   Diabetes type 2, uncontrolled (HCC)   Benign paroxysmal positional vertigo   CAD (coronary artery disease)/coronary calcifications   History of CVA (cerebrovascular accident)   Acute hypokalemia   Acute kidney injury ([place])   Leukocytosis   Concussion   1-Syncope;  Unclear if orthostatic, vs vasovagal.  MRI brain negative for acute stroke.  Still with mild dizziness.  Adjust BP medications. Resume low dose norvasc. Hold diuretics.  ECHO pending.  EKG normal sinus rhythm.  Troponin negative.   Dizziness; could be related to vertigo.  Need PT, vestibular follow up.   Acute concussion;  Stable. Monitor.   Neck pain; PRN tramadol.   HTN;  Will resume low dose Norvasc.     Acute hypokalemia Resolved.     Leukocytosis -Likely related to degree of dehydration and recent fall -No fevers or other sources of infection identified -resolved.  History of DVT (deep vein thrombosis); coumadin stopped by PCP after she completed treatment,  -D-dimer was elevated but CT angiogram of chest was negative for PE -No unilateral lower extremity edema identified on physical exam    HLD (hyperlipidemia) -Continue preadmission Zocor      Acute kidney injury  -Baseline renal function: 15/0.85 -Current renal function: 32/1.4 -improved. CK normal.     Diabetes  type 2, uncontrolled  -Patient only takes Amaryl for diabetes is unclear why Januvia was stopped - hemoglobin A1c at 8 -SSI.   DVT prophylaxis: lovenox Code Status: Full code.  Family Communication: care discussed with patient.  Disposition Plan: remain inpatient for observation of BP and on telemetry  Consultants:   none   Procedures:  ECHO pending.    Antimicrobials:   none   Subjective: Still feeling dizzy, worse on ambulation.   Objective: Vitals:   03/01/16 0845 03/01/16 1235 03/01/16 1240 03/01/16 1305  BP: 101/78 (!) 132/99 (!) 162/71 (!) 141/56  Pulse: 66   68  Resp: 16   17  Temp: 98.5 F (36.9 C)   98.6 F (37 C)  TempSrc: Oral   Oral  SpO2: 99%   100%  Weight:      Height:        Intake/Output Summary (Last 24 hours) at 03/01/16 1639 Last data filed at 03/01/16 1230  Gross per 24 hour  Intake              480 ml  Output                0 ml  Net              480 ml   Filed Weights   02/28/16 2119 02/29/16 1539 03/01/16 0500  Weight: 95.3 kg (210 lb) 92.4 kg (203 lb 11.2 oz) 92.4 kg (203 lb 12.8 oz)    Examination:  General exam: Appears calm and comfortable  Respiratory system: Clear to auscultation. Respiratory effort normal. Cardiovascular system: S1 & S2 heard, RRR. No JVD, murmurs, rubs, gallops or clicks. No pedal edema. Gastrointestinal system: Abdomen is nondistended, soft and nontender. No organomegaly or masses felt. Normal bowel sounds heard. Central nervous system: Alert and oriented. No focal neurological deficits. Extremities: Symmetric 5 x 5 power. Skin: No rashes, lesions or ulcers Psychiatry: Judgement and insight appear normal. Mood & affect appropriate.     Data Reviewed: I have personally reviewed following labs and imaging studies  CBC:  Recent Labs Lab 02/28/16 2203 03/01/16 0332  WBC 14.7* 10.9*  HGB 12.2 11.6*  HCT 38.0 37.2  MCV 81.0 81.8  PLT 288 [id number]   Basic Metabolic Panel:  Recent Labs Lab  02/28/16 2203 03/01/16 0332  NA 135 136  K 3.2* 3.7  CL 100* 103  CO2 26 26  GLUCOSE 351* 191*  BUN 32* 24*  CREATININE 1.40* 1.13*  CALCIUM 9.3 8.7*   GFR: Estimated Creatinine Clearance: 46.2 mL/min (by C-G formula based on SCr of 1.13 mg/dL (H)). Liver Function Tests: No results for input(s): AST, ALT, ALKPHOS, BILITOT, PROT, ALBUMIN in the last 168 hours. No results for input(s): LIPASE, AMYLASE in the last 168 hours. No results for input(s): AMMONIA in the last 168 hours. Coagulation Profile:  Recent Labs Lab 02/28/16 2205  INR 1.16   Cardiac Enzymes:  Recent Labs Lab 02/29/16 1005  CKTOTAL 53   BNP (last 3 results) No results for input(s): PROBNP in the last 8760 hours. HbA1C:  Recent  Labs  02/29/16 1014  HGBA1C 8.6*   CBG:  Recent Labs Lab 02/29/16 1615 02/29/16 2142 03/01/16 0613 03/01/16 1125 03/01/16 1608  GLUCAP 269* 141* 177* 197* 217*   Lipid Profile: No results for input(s): CHOL, HDL, LDLCALC, TRIG, CHOLHDL, LDLDIRECT in the last 72 hours. Thyroid Function Tests: No results for input(s): TSH, T4TOTAL, FREET4, T3FREE, THYROIDAB in the last 72 hours. Anemia Panel: No results for input(s): VITAMINB12, FOLATE, FERRITIN, TIBC, IRON, RETICCTPCT in the last 72 hours. Sepsis Labs: No results for input(s): PROCALCITON, LATICACIDVEN in the last 168 hours.  No results found for this or any previous visit (from the past 240 hour(s)).       Radiology Studies: Dg Chest 2 View  Result Date: 02/29/2016 CLINICAL DATA:  Pain after fall EXAM: CHEST  2 VIEW COMPARISON:  CXR 12/31/2015 FINDINGS: The heart size and mediastinal contours are within normal limits. Both lungs are clear. No pleural effusion, CHF nor pneumothorax. Stable thoracic spondylosis and bilateral glenohumeral joint osteoarthritis. IMPRESSION: No active cardiopulmonary disease. Electronically Signed   By: Ashley Royalty M.D.   On: 02/29/2016 03:59   Dg Thoracic Spine 2 View  Result  Date: 02/29/2016 CLINICAL DATA:  Pain after fall EXAM: THORACIC SPINE 2 VIEWS COMPARISON:  12/31/2015 chest radiograph FINDINGS: There is no evidence of acute thoracic spine fracture. Thoracic spondylosis with multilevel degenerative disc disease, most prominent from T6 through T8 appears stable. Multilevel osteophytes are noted, similar to the lateral chest radiograph from 12/31/2015. Alignment is normal. No other significant bone abnormalities are identified. IMPRESSION: Thoracic spondylosis without acute osseous abnormality. Electronically Signed   By: Ashley Royalty M.D.   On: 02/29/2016 03:57   Ct Head Wo Contrast  Result Date: 02/29/2016 CLINICAL DATA:  Syncope with head injury. Patient became dizzy and fell while walking out of the bathroom. Pain to the left side and neck. EXAM: CT HEAD WITHOUT CONTRAST CT CERVICAL SPINE WITHOUT CONTRAST TECHNIQUE: Multidetector CT imaging of the head and cervical spine was performed following the standard protocol without intravenous contrast. Multiplanar CT image reconstructions of the cervical spine were also generated. COMPARISON:  CT head and cervical spine 06/22/2007 FINDINGS: CT HEAD FINDINGS Brain: Patchy low-attenuation changes in the deep white matter likely representing small vessel ischemic change. No evidence of acute infarction, hemorrhage, hydrocephalus, extra-axial collection or mass lesion/mass effect. Vascular: Vascular calcifications are present. Skull: Normal. Negative for fracture or focal lesion. Sinuses/Orbits: No acute finding. Other: Congenital nonunion of posterior arch of C1. CT CERVICAL SPINE FINDINGS Alignment: Normal. Skull base and vertebrae: No acute fracture. No primary bone lesion or focal pathologic process. Congenital nonunion of posterior arch of C1. Soft tissues and spinal canal: No prevertebral fluid or swelling. No visible canal hematoma. Disc levels: Diffuse degenerative change throughout the cervical spine with narrowed interspaces  and endplate hypertrophic changes throughout. Degenerative changes in the facet joints. Uncovertebral spurring. Upper chest: Vascular calcifications.  Lung apices are clear. Other: None. IMPRESSION: No acute intracranial abnormalities. Mild small vessel ischemic change in the deep white matter. Normal alignment of the cervical spine. Diffuse degenerative change. No acute displaced fractures identified. Electronically Signed   By: Lucienne Capers M.D.   On: 02/29/2016 04:08   Ct Angio Chest Pe W And/or Wo Contrast  Result Date: 02/29/2016 CLINICAL DATA:  Dizziness, syncope, and fall yesterday. Nausea and weakness. Elevated D-dimer. Shortness of breath. History of DVT. EXAM: CT ANGIOGRAPHY CHEST WITH CONTRAST TECHNIQUE: Multidetector CT imaging of the chest was performed using the standard  protocol during bolus administration of intravenous contrast. Multiplanar CT image reconstructions and MIPs were obtained to evaluate the vascular anatomy. CONTRAST:  74 mL Isovue 370 COMPARISON:  07/02/2013 FINDINGS: Cardiovascular: Satisfactory opacification of the pulmonary arteries to the segmental level. No evidence of pulmonary embolism. Normal heart size. No pericardial effusion. Coronary artery calcifications. Mediastinum/Nodes: No enlarged mediastinal, hilar, or axillary lymph nodes. Thyroid gland, trachea, and esophagus demonstrate no significant findings. Lungs/Pleura: Lungs are clear. No pleural effusion or pneumothorax. Upper Abdomen: No acute abnormality. Musculoskeletal: Degenerative changes in the spine. Review of the MIP images confirms the above findings. IMPRESSION: No evidence of significant pulmonary embolus. No evidence of active pulmonary disease. Electronically Signed   By: Lucienne Capers M.D.   On: 02/29/2016 05:29   Ct Cervical Spine Wo Contrast  Result Date: 02/29/2016 CLINICAL DATA:  Syncope with head injury. Patient became dizzy and fell while walking out of the bathroom. Pain to the left side  and neck. EXAM: CT HEAD WITHOUT CONTRAST CT CERVICAL SPINE WITHOUT CONTRAST TECHNIQUE: Multidetector CT imaging of the head and cervical spine was performed following the standard protocol without intravenous contrast. Multiplanar CT image reconstructions of the cervical spine were also generated. COMPARISON:  CT head and cervical spine 06/22/2007 FINDINGS: CT HEAD FINDINGS Brain: Patchy low-attenuation changes in the deep white matter likely representing small vessel ischemic change. No evidence of acute infarction, hemorrhage, hydrocephalus, extra-axial collection or mass lesion/mass effect. Vascular: Vascular calcifications are present. Skull: Normal. Negative for fracture or focal lesion. Sinuses/Orbits: No acute finding. Other: Congenital nonunion of posterior arch of C1. CT CERVICAL SPINE FINDINGS Alignment: Normal. Skull base and vertebrae: No acute fracture. No primary bone lesion or focal pathologic process. Congenital nonunion of posterior arch of C1. Soft tissues and spinal canal: No prevertebral fluid or swelling. No visible canal hematoma. Disc levels: Diffuse degenerative change throughout the cervical spine with narrowed interspaces and endplate hypertrophic changes throughout. Degenerative changes in the facet joints. Uncovertebral spurring. Upper chest: Vascular calcifications.  Lung apices are clear. Other: None. IMPRESSION: No acute intracranial abnormalities. Mild small vessel ischemic change in the deep white matter. Normal alignment of the cervical spine. Diffuse degenerative change. No acute displaced fractures identified. Electronically Signed   By: Lucienne Capers M.D.   On: 02/29/2016 04:08   Mr Brain Wo Contrast  Result Date: 02/29/2016 CLINICAL DATA:  Syncopal episode, falling with trauma to the left side of the head. EXAM: MRI HEAD WITHOUT CONTRAST TECHNIQUE: Multiplanar, multiecho pulse sequences of the brain and surrounding structures were obtained without intravenous contrast.  COMPARISON:  Head CT same day.  MRI 02/28/2005. FINDINGS: Brain: Diffusion imaging does not show any acute or subacute infarction. There chronic small-vessel ischemic changes affecting the pons. There are a few old small vessel cerebellar infarctions. Cerebral hemispheres show chronic small-vessel ischemic changes affecting the thalami and the cerebral hemispheric deep and subcortical white matter. No large vessel territory infarction. No mass lesion, hemorrhage, hydrocephalus or extra-axial collection. No pituitary mass. Vascular: Major vessels at the base of the brain show flow. Skull and upper cervical spine: Negative Sinuses/Orbits: Clear/normal Other: None significant IMPRESSION: No acute or traumatic finding. Chronic small-vessel ischemic changes affecting the pons, cerebellum, thalami and cerebral hemispheric white matter, considerably progressive since 2006. Electronically Signed   By: Nelson Chimes M.D.   On: 02/29/2016 09:23   Dg Shoulder Left  Result Date: 02/29/2016 CLINICAL DATA:  Patient passed out earlier landing on left side of body. Left shoulder pain. EXAM: LEFT SHOULDER -  2+ VIEW COMPARISON:  None. FINDINGS: There is no evidence of fracture or dislocation. Mild undersurface spurring of the acromion is suggested on the AP view. Soft tissues are unremarkable. The visualized ribs and lung are unremarkable. IMPRESSION: No acute osseous abnormality of the left shoulder. Electronically Signed   By: Ashley Royalty M.D.   On: 02/29/2016 03:54        Scheduled Meds: . amLODipine  5 mg Oral Daily  . enoxaparin (LOVENOX) injection  40 mg Subcutaneous Q24H  . glimepiride  4 mg Oral Q breakfast  . insulin aspart  0-15 Units Subcutaneous TID WC  . insulin aspart  0-5 Units Subcutaneous QHS  . simvastatin  20 mg Oral Daily  . sodium chloride flush  3 mL Intravenous Q12H   Continuous Infusions:   LOS: 0 days    Time spent: 35 minutes.     Elmarie Shiley, MD [facility] Pager  [phone]  If 7PM-7AM, please contact night-coverage [email or web address] Password [facility] 03/01/2016, 4:39 PM

## 2016-03-01 NOTE — Care Management Obs Status (Signed)
[place] NOTIFICATION   Patient Details  Name: Kristen [name] MRN: [id number] Date of Birth: 03-19-1941   Medicare Observation Status Notification Given:  Yes    Dawayne Patricia, RN 03/01/2016, 3:04 PM

## 2016-03-01 NOTE — Evaluation (Signed)
Physical Therapy Evaluation Patient Details Name: Kristen [name] MRN: [id number] DOB: 31-Oct-1940 Today's Date: 03/01/2016   History of Present Illness  pt presents following a Syncopal Episode.  pt with hx of HTN, CAD, MI, CVA, DM, Bil TKRs, and BPPV (pt denies).    Clinical Impression  Pt eager for mobility, but limited today by feeling faint.  Pt tested positive for R Hallpike, though difficult to determine rotation of nystagmus as pt clenches eyes shut and PT had to pull eyelid open, but nystagmus ending at that point.  Treated once with repositioning, though pt remains quite vague with description of symptoms and is focused on stiffness in L shoulder and L side of neck since fall.  Ambulated in hallway with pt c/o feeling faint and indicating Bil peripheral visual changes.  Chair brought to pt and BP checked on return to room and found to be 192/53.  Had pt stand and walk in place to simulate activity with BP checked again and found to be 161/70.  HR ranged 60s to 80s throughout mobility.  RN present and aware of BP.  Will need to continue to follow pt for further vestibular follow-up and to increase activity as pt's BP allows.  Will continue to follow.      Follow Up Recommendations Outpatient PT;Supervision - Intermittent (Vestibular Therapy)    Equipment Recommendations  None recommended by PT    Recommendations for Other Services       Precautions / Restrictions Precautions Precautions: Fall Precaution Comments: Check BPs Restrictions Weight Bearing Restrictions: No      Mobility  Bed Mobility Overal bed mobility: Needs Assistance Bed Mobility: Rolling;Supine to Sit Rolling: Min assist   Supine to sit: Min guard     General bed mobility comments: pt moves slowly and needs cues for sequencing.  pt seems very guarded in her movements and indicates she's worried about making L shoulder and neck tightness worse.    Transfers Overall transfer level: Needs  assistance Equipment used: Straight cane Transfers: Sit to/from Stand Sit to Stand: Min guard         General transfer comment: pt effortful with coming to standing and needs increased time, but no physical A needed.    Ambulation/Gait Ambulation/Gait assistance: Min guard;Mod assist Ambulation Distance (Feet): 60 Feet Assistive device: Straight cane Gait Pattern/deviations: Step-through pattern;Decreased stride length     General Gait Details: pt initially MinG moving slowly and with guarded posture, but at end of gait began to complain of peripheral vision decreasing and feeling faint.  At that time pt required [place] and chair brought to her.  Once sitting BP found to be 192/53.  Had pt stand and walk in place to simulate activity and BP repeated while standing and found to be 161/70.  RN present and aware of BPs.    Stairs            Wheelchair Mobility    Modified Rankin (Stroke Patients Only)       Balance Overall balance assessment: Needs assistance Sitting-balance support: No upper extremity supported;Feet supported Sitting balance-Leahy Scale: Good     Standing balance support: Single extremity supported;During functional activity Standing balance-Leahy Scale: Poor                               Pertinent Vitals/Pain Pain Assessment: Faces Faces Pain Scale: Hurts little more Pain Location: pt indicates tightness in L neck and shoulder since fall,  but doesn't rate when asked.   Pain Descriptors / Indicators: Tightness Pain Intervention(s): Monitored during session;Repositioned    Home Living Family/patient expects to be discharged to:: Private residence Living Arrangements: Alone Available Help at Discharge: Family;Friend(s);Available PRN/intermittently Type of Home: House       Home Layout: One level Home Equipment: Cane - single point      Prior Function Level of Independence: Independent         Comments: pt indicates she only  uses her cane if she is not feeling well.       Hand Dominance        Extremity/Trunk Assessment   Upper Extremity Assessment: Overall WFL for tasks assessed           Lower Extremity Assessment: Overall WFL for tasks assessed      Cervical / Trunk Assessment: Normal  Communication   Communication: No difficulties  Cognition Arousal/Alertness: Awake/alert Behavior During Therapy: WFL for tasks assessed/performed Overall Cognitive Status: Within Functional Limits for tasks assessed                      General Comments General comments (skin integrity, edema, etc.): Performed Hallpike to L was negative, but to R positive for nystagmus and symptoms.  Difficult to determine direction of nystagmus as pt clenchs eyes shut and PT had to pull eyelid open, though nystagmus ending at that point.  pt very vague in descriptions of symptoms and focused on L neck and shoulder stiffness since fall.  Perform repositioning for R side with pt having difficulty with positioning due to neck stiffness.  Will need further vestibular assessment.      Exercises     Assessment/Plan    PT Assessment Patient needs continued PT services  PT Problem List Decreased activity tolerance;Decreased balance;Decreased mobility;Decreased knowledge of use of DME;Cardiopulmonary status limiting activity;Pain          PT Treatment Interventions DME instruction;Gait training;Stair training;Functional mobility training;Therapeutic activities;Therapeutic exercise;Balance training;Neuromuscular re-education;Patient/family education    PT Goals (Current goals can be found in the Care Plan section)  Acute Rehab PT Goals Patient Stated Goal: Home PT Goal Formulation: With patient Time For Goal Achievement: 03/15/16 Potential to Achieve Goals: Good    Frequency Min 3X/week   Barriers to discharge Decreased caregiver support      Co-evaluation               End of Session Equipment Utilized  During Treatment: Gait belt Activity Tolerance: Treatment limited secondary to medical complications (Comment) (BPs) Patient left: in chair;with call bell/phone within reach;with nursing/sitter in room Nurse Communication: Mobility status    Functional Assessment Tool Used: Clinical Judgement Functional Limitation: Mobility: Walking and moving around Mobility: Walking and Moving Around Current Status [id number]): At least 20 percent but less than 40 percent impaired, limited or restricted Mobility: Walking and Moving Around Goal Status [phone]): 0 percent impaired, limited or restricted    Time: [id number] PT Time Calculation (min) (ACUTE ONLY): 54 min   Charges:   PT Evaluation $PT Eval Moderate Complexity: 1 Procedure PT Treatments $Gait Training: 8-22 mins $Therapeutic Activity: 8-22 mins $[place] Rep Proc: 8-22 mins   PT G Codes:   PT G-Codes **NOT FOR INPATIENT CLASS** Functional Assessment Tool Used: Clinical Judgement Functional Limitation: Mobility: Walking and moving around Mobility: Walking and Moving Around Current Status [id number]): At least 20 percent but less than 40 percent impaired, limited or restricted Mobility: Walking and Moving Around Goal Status [phone]): 0  percent impaired, limited or restricted    Catarina Hartshorn, [place] 03/01/2016, 10:58 AM

## 2016-03-02 ENCOUNTER — Observation Stay (HOSPITAL_BASED_OUTPATIENT_CLINIC_OR_DEPARTMENT_OTHER): Payer: Medicare Other

## 2016-03-02 DIAGNOSIS — R55 Syncope and collapse: Secondary | ICD-10-CM | POA: Diagnosis not present

## 2016-03-02 LAB — ECHOCARDIOGRAM COMPLETE
HEIGHTINCHES: 62 in
WEIGHTICAEL: 3288 [oz_av]

## 2016-03-02 LAB — GLUCOSE, CAPILLARY
GLUCOSE-CAPILLARY: 344 mg/dL — AB (ref 65–99)
Glucose-Capillary: 188 mg/dL — ABNORMAL HIGH (ref 65–99)

## 2016-03-02 MED ORDER — PERFLUTREN LIPID MICROSPHERE
INTRAVENOUS | Status: AC
  Administered 2016-03-02: 4 mL via INTRAVENOUS
  Filled 2016-03-02: qty 10

## 2016-03-02 MED ORDER — PERFLUTREN LIPID MICROSPHERE
1.0000 mL | INTRAVENOUS | Status: AC | PRN
  Administered 2016-03-02: 4 mL via INTRAVENOUS
  Filled 2016-03-02: qty 10

## 2016-03-02 MED ORDER — TRAMADOL HCL 50 MG PO TABS: 50.0000 mg | ORAL_TABLET | Freq: Four times a day (QID) | ORAL | 0 refills | Status: DC | PRN

## 2016-03-02 NOTE — Discharge Summary (Signed)
Physician Discharge Summary  [place] KNL:[id number] DOB: 03-27-1941 DOA: 02/29/2016  PCP: [name], MD  Admit date: 02/29/2016 Discharge date: 03/02/2016  Admitted From: Home  Disposition:  Home   Recommendations for Outpatient Follow-up:  1. Follow up with PCP in 1-2 weeks 2. Please obtain BMP/CBC in one week 3. Needs further adjustment of DM medications regimen.  4. Needs PT , vestibular rehab.  5. Consider referral to cardiology for further evaluation of syncope,   Discharge Condition: stable.  CODE STATUS: full code.  Diet recommendation: Heart Healthy  Brief/Interim Summary: Kristen Depinto Matkinsis a 75 y.o.femalewith medical history significant for diabetes mellitus on oral agents, obesity, dyslipidemia, hypertension, history of DVT previously on anticoagulation, remote history of CVA, and known coronary calcifications. Patient reports that for several days she has felt somewhat weak and faint without true dizziness. She describes some of these sensations as" standing and felt like she was falling" without actually falling. She had a very severe episode last night and decided to lay down on her bed. She got up to go to the bathroom and before she can get into the bathroom she fell violently against the door jam. Although she had her phone with her she had fallen on top of her phone. At some point she was able to crawl back to her bed and lay down and her symptoms continued. She describes this as feeling "disoriented". Prior to onset of the syncopal episode she did not have any chest pain, palpitations, visual disturbances. She was not incontinent she did notice that when she came to she was quite diaphoretic. Since following she has had discomfort in her left shoulder.  ED Course: Vital Signs: BP 185/68  Pulse (!) 51  Temp 98.2 F (36.8 C) (Oral)  Resp 13  Ht '5\' 2"'  (1.575 m)  Wt 95.3 kg (210 lb)  SpO2 99%  BMI 38.41 kg/m  DGleft shoulder and thoracic  spinewithout acute injury CT head without contrast, CT cervical spine without contrast:Patchy low attenuation changes in the deep white matter consistent with small vessel ischemic change without acute infarction hemorrhage or hydrocephalus, no mass lesion or mass effect. No acute cervical spine injuries. CT angiography chest PE with an without contrast:No acute PE Lab data:Sodium 135, potassium 3.2, chloride 100, CO2 26, BUN 32, creatinine 1.4, glucose 351, anion gap 9, poctroponin negative 2, white count 14,700 differential not obtained, hemoglobin 12.2, platelets 288,000, d-dimer 1.42, coags normal, urinalysis consistent with UTI but does have few bacteria, hyaline casts, 250 glucose, no leukocytes noted ketones no nitrite and WBC 0-5 Medications and treatments:Normal saline bolus 1 L, Tylenol 1 g 1, Cozaar 100 mg 1, hydrochlorothiazide 12.5 mg 1, Norvasc 5 mg 1   Assessment & Plan: 1-Syncope;  Unclear if orthostatic, vs vasovagal.  MRI brain negative for acute stroke.  Adjust BP medications. Resume low dose norvasc. Hold diuretics.  ECHO normal EF and normal Aortic valve EKG normal sinus rhythm.  Troponin negative.  Consider referral to cardiology outpatient.   Dizziness; could be related to vertigo.  Need PT, vestibular follow up.   Acute concussion;  Stable. Monitor.   Neck pain; PRN tramadol.   HTN;  resume low dose Norvasc.   Acute hypokalemia Resolved.   Leukocytosis -Likely related to degree of dehydration and recent fall -No fevers or other sources of infection identified -resolved.   History of DVT (deep vein thrombosis); coumadin stopped by PCP after she completed treatment,  -D-dimer was elevated but CT angiogram of chest  was negative for PE -No unilateral lower extremity edema identified on physical exam  HLD (hyperlipidemia) -Continue preadmission Zocor    Acute kidney injury  -Baseline renal function: 15/0.85 -Current  renal function: 32/1.4 -improved. CK normal.  -hold diuretics.   Diabetes type 2, uncontrolled  -continue with Amaryl. Resume Januvia  - hemoglobin A1c at 8 -SSI.   Discharge Diagnoses:  Principal Problem:   Syncope Active Problems:   Hypertension, uncontrolled   History of DVT (deep vein thrombosis)   HLD (hyperlipidemia)   Obesity, Class II, BMI 35-39.9, with comorbidity (HCC)   Diabetes type 2, uncontrolled (HCC)   Benign paroxysmal positional vertigo   CAD (coronary artery disease)/coronary calcifications   History of CVA (cerebrovascular accident)   Acute hypokalemia   Acute kidney injury ([name])   Leukocytosis   Concussion    Discharge Instructions  Discharge Instructions    Diet - low sodium heart healthy    Complete by:  As directed    Increase activity slowly    Complete by:  As directed        Medication List    STOP taking these medications   baclofen 10 MG tablet Commonly known as:  LIORESAL   colchicine 0.6 MG tablet   losartan-hydrochlorothiazide 100-12.5 MG tablet Commonly known as:  HYZAAR   predniSONE 20 MG tablet Commonly known as:  DELTASONE     TAKE these medications   amLODipine 5 MG tablet Commonly known as:  NORVASC TAKE 1 TABLET (5 MG TOTAL) BY MOUTH DAILY.   glimepiride 4 MG tablet Commonly known as:  AMARYL Take 4 mg by mouth daily with breakfast.   glucose blood test strip Ck blood sugar twice a day and as directed.   nitroGLYCERIN 0.4 MG SL tablet Commonly known as:  NITROSTAT Place 0.4 mg under the tongue every 5 (five) minutes as needed for chest pain.   ONE TOUCH ULTRA SYSTEM KIT w/Device Kit 1 kit by Does not apply route once.   ONETOUCH DELICA LANCETS 19J Misc Check blood sugar twice a day and as directed.   simvastatin 20 MG tablet Commonly known as:  ZOCOR Take 20 mg by mouth daily.   sitaGLIPtin 100 MG tablet Commonly known as:  JANUVIA Take 1 tablet (100 mg total) by mouth daily.   traMADol 50 MG  tablet Commonly known as:  ULTRAM Take 1 tablet (50 mg total) by mouth every 6 (six) hours as needed. What changed:  when to take this      Follow-up Information    [facility] Follow up.   Specialty:  Rehabilitation Why:  Referral made for Physical Therapy -Vestibular Therapy/rehab- Contact information: [place] [phone] [place] [id number] [phone]         Allergies  Allergen Reactions  . Acetaminophen Other (See Comments)    "Causes me to spit up blood"  . Aleve [Naproxen Sodium] Other (See Comments)    Spits up blood  . Doxycycline Other (See Comments)    Malaise, GI upset, "felt drunk" and very ill  . Metformin And Related Other (See Comments)    Chills, dizziness  . Penicillins Rash    Has patient had a PCN reaction causing immediate rash, facial/tongue/throat swelling, SOB or lightheadedness with hypotension: Yes Has patient had a PCN reaction causing severe rash involving mucus membranes or skin necrosis: No Has patient had a PCN reaction that required hospitalization No Has patient had a PCN reaction occurring within the last  10 years: No If all of the above answers are "NO", then may proceed with Cephalosporin use.     Consultations:  none   Procedures/Studies: Dg Chest 2 View  Result Date: 02/29/2016 CLINICAL DATA:  Pain after fall EXAM: CHEST  2 VIEW COMPARISON:  CXR 12/31/2015 FINDINGS: The heart size and mediastinal contours are within normal limits. Both lungs are clear. No pleural effusion, CHF nor pneumothorax. Stable thoracic spondylosis and bilateral glenohumeral joint osteoarthritis. IMPRESSION: No active cardiopulmonary disease. Electronically Signed   By: Ashley Royalty M.D.   On: 02/29/2016 03:59   Dg Thoracic Spine 2 View  Result Date: 02/29/2016 CLINICAL DATA:  Pain after fall EXAM: THORACIC SPINE 2 VIEWS COMPARISON:  12/31/2015 chest radiograph FINDINGS: There is  no evidence of acute thoracic spine fracture. Thoracic spondylosis with multilevel degenerative disc disease, most prominent from T6 through T8 appears stable. Multilevel osteophytes are noted, similar to the lateral chest radiograph from 12/31/2015. Alignment is normal. No other significant bone abnormalities are identified. IMPRESSION: Thoracic spondylosis without acute osseous abnormality. Electronically Signed   By: Ashley Royalty M.D.   On: 02/29/2016 03:57   Dg Wrist Complete Left  Result Date: 02/07/2016 CLINICAL DATA:  Left thumb pain and swelling for 2 weeks, initial encounter EXAM: LEFT WRIST - COMPLETE 3+ VIEW COMPARISON:  None. FINDINGS: Mild degenerative changes at the first [facility] joint are seen. Mild soft tissue swelling is noted. No acute fracture or dislocation is noted. IMPRESSION: Degenerative change and soft tissue swelling. No acute bony abnormality is seen. Electronically Signed   By: Inez Catalina M.D.   On: 02/07/2016 16:00   Ct Head Wo Contrast  Result Date: 02/29/2016 CLINICAL DATA:  Syncope with head injury. Patient became dizzy and fell while walking out of the bathroom. Pain to the left side and neck. EXAM: CT HEAD WITHOUT CONTRAST CT CERVICAL SPINE WITHOUT CONTRAST TECHNIQUE: Multidetector CT imaging of the head and cervical spine was performed following the standard protocol without intravenous contrast. Multiplanar CT image reconstructions of the cervical spine were also generated. COMPARISON:  CT head and cervical spine 06/22/2007 FINDINGS: CT HEAD FINDINGS Brain: Patchy low-attenuation changes in the deep white matter likely representing small vessel ischemic change. No evidence of acute infarction, hemorrhage, hydrocephalus, extra-axial collection or mass lesion/mass effect. Vascular: Vascular calcifications are present. Skull: Normal. Negative for fracture or focal lesion. Sinuses/Orbits: No acute finding. Other: Congenital nonunion of posterior arch of C1. CT CERVICAL SPINE  FINDINGS Alignment: Normal. Skull base and vertebrae: No acute fracture. No primary bone lesion or focal pathologic process. Congenital nonunion of posterior arch of C1. Soft tissues and spinal canal: No prevertebral fluid or swelling. No visible canal hematoma. Disc levels: Diffuse degenerative change throughout the cervical spine with narrowed interspaces and endplate hypertrophic changes throughout. Degenerative changes in the facet joints. Uncovertebral spurring. Upper chest: Vascular calcifications.  Lung apices are clear. Other: None. IMPRESSION: No acute intracranial abnormalities. Mild small vessel ischemic change in the deep white matter. Normal alignment of the cervical spine. Diffuse degenerative change. No acute displaced fractures identified. Electronically Signed   By: Lucienne Capers M.D.   On: 02/29/2016 04:08   Ct Angio Chest Pe W And/or Wo Contrast  Result Date: 02/29/2016 CLINICAL DATA:  Dizziness, syncope, and fall yesterday. Nausea and weakness. Elevated D-dimer. Shortness of breath. History of DVT. EXAM: CT ANGIOGRAPHY CHEST WITH CONTRAST TECHNIQUE: Multidetector CT imaging of the chest was performed using the standard protocol during bolus administration of intravenous contrast. Multiplanar CT  image reconstructions and MIPs were obtained to evaluate the vascular anatomy. CONTRAST:  74 mL Isovue 370 COMPARISON:  07/02/2013 FINDINGS: Cardiovascular: Satisfactory opacification of the pulmonary arteries to the segmental level. No evidence of pulmonary embolism. Normal heart size. No pericardial effusion. Coronary artery calcifications. Mediastinum/Nodes: No enlarged mediastinal, hilar, or axillary lymph nodes. Thyroid gland, trachea, and esophagus demonstrate no significant findings. Lungs/Pleura: Lungs are clear. No pleural effusion or pneumothorax. Upper Abdomen: No acute abnormality. Musculoskeletal: Degenerative changes in the spine. Review of the MIP images confirms the above findings.  IMPRESSION: No evidence of significant pulmonary embolus. No evidence of active pulmonary disease. Electronically Signed   By: Lucienne Capers M.D.   On: 02/29/2016 05:29   Ct Cervical Spine Wo Contrast  Result Date: 02/29/2016 CLINICAL DATA:  Syncope with head injury. Patient became dizzy and fell while walking out of the bathroom. Pain to the left side and neck. EXAM: CT HEAD WITHOUT CONTRAST CT CERVICAL SPINE WITHOUT CONTRAST TECHNIQUE: Multidetector CT imaging of the head and cervical spine was performed following the standard protocol without intravenous contrast. Multiplanar CT image reconstructions of the cervical spine were also generated. COMPARISON:  CT head and cervical spine 06/22/2007 FINDINGS: CT HEAD FINDINGS Brain: Patchy low-attenuation changes in the deep white matter likely representing small vessel ischemic change. No evidence of acute infarction, hemorrhage, hydrocephalus, extra-axial collection or mass lesion/mass effect. Vascular: Vascular calcifications are present. Skull: Normal. Negative for fracture or focal lesion. Sinuses/Orbits: No acute finding. Other: Congenital nonunion of posterior arch of C1. CT CERVICAL SPINE FINDINGS Alignment: Normal. Skull base and vertebrae: No acute fracture. No primary bone lesion or focal pathologic process. Congenital nonunion of posterior arch of C1. Soft tissues and spinal canal: No prevertebral fluid or swelling. No visible canal hematoma. Disc levels: Diffuse degenerative change throughout the cervical spine with narrowed interspaces and endplate hypertrophic changes throughout. Degenerative changes in the facet joints. Uncovertebral spurring. Upper chest: Vascular calcifications.  Lung apices are clear. Other: None. IMPRESSION: No acute intracranial abnormalities. Mild small vessel ischemic change in the deep white matter. Normal alignment of the cervical spine. Diffuse degenerative change. No acute displaced fractures identified. Electronically  Signed   By: Lucienne Capers M.D.   On: 02/29/2016 04:08   Mr Brain Wo Contrast  Result Date: 02/29/2016 CLINICAL DATA:  Syncopal episode, falling with trauma to the left side of the head. EXAM: MRI HEAD WITHOUT CONTRAST TECHNIQUE: Multiplanar, multiecho pulse sequences of the brain and surrounding structures were obtained without intravenous contrast. COMPARISON:  Head CT same day.  MRI 02/28/2005. FINDINGS: Brain: Diffusion imaging does not show any acute or subacute infarction. There chronic small-vessel ischemic changes affecting the pons. There are a few old small vessel cerebellar infarctions. Cerebral hemispheres show chronic small-vessel ischemic changes affecting the thalami and the cerebral hemispheric deep and subcortical white matter. No large vessel territory infarction. No mass lesion, hemorrhage, hydrocephalus or extra-axial collection. No pituitary mass. Vascular: Major vessels at the base of the brain show flow. Skull and upper cervical spine: Negative Sinuses/Orbits: Clear/normal Other: None significant IMPRESSION: No acute or traumatic finding. Chronic small-vessel ischemic changes affecting the pons, cerebellum, thalami and cerebral hemispheric white matter, considerably progressive since 2006. Electronically Signed   By: Nelson Chimes M.D.   On: 02/29/2016 09:23   Dg Shoulder Left  Result Date: 02/29/2016 CLINICAL DATA:  Patient passed out earlier landing on left side of body. Left shoulder pain. EXAM: LEFT SHOULDER - 2+ VIEW COMPARISON:  None. FINDINGS: There is  no evidence of fracture or dislocation. Mild undersurface spurring of the acromion is suggested on the AP view. Soft tissues are unremarkable. The visualized ribs and lung are unremarkable. IMPRESSION: No acute osseous abnormality of the left shoulder. Electronically Signed   By: Ashley Royalty M.D.   On: 02/29/2016 03:54       Subjective: Feeling ok, get some times some hot flushes. Dizziness better.  Discharge  Exam: Vitals:   03/02/16 0300 03/02/16 1349  BP: 137/67 (!) 145/51  Pulse: (!) 58 68  Resp: 20 19  Temp: 98.3 F (36.8 C) 98.6 F (37 C)   Vitals:   03/01/16 1305 03/01/16 2008 03/02/16 0300 03/02/16 1349  BP: (!) 141/56 (!) 159/60 137/67 (!) 145/51  Pulse: 68 65 (!) 58 68  Resp: '17 20 20 19  ' Temp: 98.6 F (37 C) 98.1 F (36.7 C) 98.3 F (36.8 C) 98.6 F (37 C)  TempSrc: Oral Oral Oral Oral  SpO2: 100% 98% 98%   Weight:   93.2 kg (205 lb 8 oz)   Height:        General: Pt is alert, awake, not in acute distress Cardiovascular: RRR, S1/S2 +, no rubs, no gallops Respiratory: CTA bilaterally, no wheezing, no rhonchi Abdominal: Soft, NT, ND, bowel sounds + Extremities: no edema, no cyanosis    The results of significant diagnostics from this hospitalization (including imaging, microbiology, ancillary and laboratory) are listed below for reference.     Microbiology: No results found for this or any previous visit (from the past 240 hour(s)).   Labs: BNP (last 3 results) No results for input(s): BNP in the last 8760 hours. Basic Metabolic Panel:  Recent Labs Lab 02/28/16 2203 03/01/16 0332  NA 135 136  K 3.2* 3.7  CL 100* 103  CO2 26 26  GLUCOSE 351* 191*  BUN 32* 24*  CREATININE 1.40* 1.13*  CALCIUM 9.3 8.7*   Liver Function Tests: No results for input(s): AST, ALT, ALKPHOS, BILITOT, PROT, ALBUMIN in the last 168 hours. No results for input(s): LIPASE, AMYLASE in the last 168 hours. No results for input(s): AMMONIA in the last 168 hours. CBC:  Recent Labs Lab 02/28/16 2203 03/01/16 0332  WBC 14.7* 10.9*  HGB 12.2 11.6*  HCT 38.0 37.2  MCV 81.0 81.8  PLT 288 284   Cardiac Enzymes:  Recent Labs Lab 02/29/16 1005  CKTOTAL 53   BNP: Invalid input(s): POCBNP CBG:  Recent Labs Lab 03/01/16 1125 03/01/16 1608 03/01/16 2057 03/02/16 0608 03/02/16 1104  GLUCAP 197* 217* 221* 188* 344*   D-Dimer  Recent Labs  02/28/16 2205  DDIMER  1.42*   Hgb A1c  Recent Labs  02/29/16 1014  HGBA1C 8.6*   Lipid Profile No results for input(s): CHOL, HDL, LDLCALC, TRIG, CHOLHDL, LDLDIRECT in the last 72 hours. Thyroid function studies No results for input(s): TSH, T4TOTAL, T3FREE, THYROIDAB in the last 72 hours.  Invalid input(s): FREET3 Anemia work up No results for input(s): VITAMINB12, FOLATE, FERRITIN, TIBC, IRON, RETICCTPCT in the last 72 hours. Urinalysis    Component Value Date/Time   COLORURINE YELLOW 02/28/2016 2152   APPEARANCEUR CLEAR 02/28/2016 2152   APPEARANCEUR Clear 03/10/2014 1233   LABSPEC 1.025 02/28/2016 2152   LABSPEC 1.017 03/10/2014 1233   PHURINE 5.5 02/28/2016 2152   GLUCOSEU 250 (A) 02/28/2016 2152   GLUCOSEU Negative 03/10/2014 1233   HGBUR TRACE (A) 02/28/2016 2152   BILIRUBINUR NEGATIVE 02/28/2016 2152   BILIRUBINUR Negative 04/01/2014 1102   BILIRUBINUR Negative 03/10/2014  [place] 02/28/2016 2152   PROTEINUR NEGATIVE 02/28/2016 2152   UROBILINOGEN 0.2 04/01/2014 1102   UROBILINOGEN 1.0 04/24/2008 2109   NITRITE NEGATIVE 02/28/2016 2152   LEUKOCYTESUR NEGATIVE 02/28/2016 2152   LEUKOCYTESUR Negative 03/10/2014 1233   Sepsis Labs Invalid input(s): PROCALCITONIN,  WBC,  LACTICIDVEN Microbiology No results found for this or any previous visit (from the past 240 hour(s)).   Time coordinating discharge: Over 30 minutes  SIGNED:   Elmarie Shiley, MD  [facility] 03/02/2016, 2:31 PM Pager [phone]  If 7PM-7AM, please contact night-coverage [email or web address] Password [email or web address]

## 2016-03-02 NOTE — Care Management Note (Signed)
Case Management Note Marvetta Gibbons RN, BSN Unit 2W-Case Manager [phone]  Patient Details  Name: Kristen [name] MRN: [id number] Date of Birth: Jul 21, 1940  Subjective/Objective:  Pt admitted with syncope                  Action/Plan: PTA pt lived at home-PT recommendation for outpt vestibular PT- MD verbal order given- spoke with pt at bedside- per conversation pt agreeable to referral to Neuro outpt rehab for vestibular PT- referral made via epic to [facility] rehab for outpt vestibular PT.  Address and Phone # provided to pt.   Expected Discharge Date:     03/02/16             Expected Discharge Plan:  Home/Self Care  In-House Referral:     Discharge planning Services  CM Consult  Post Acute Care Choice:    Choice offered to:  Patient  DME Arranged:    DME Agency:     HH Arranged:    [place] Agency:     Status of Service:  Completed, signed off  If discussed at [employer] of Stay Meetings, dates discussed:    Additional Comments:  Dawayne Patricia, RN 03/02/2016, 11:33 AM

## 2016-03-02 NOTE — Progress Notes (Signed)
  Echocardiogram 2D Echocardiogram has been performed.  Kristen [name] M 03/02/2016, 10:20 AM

## 2016-03-02 NOTE — Progress Notes (Signed)
Physical Therapy Treatment Patient Details Name: Kristen [name] MRN: [id number] DOB: 1940-09-24 Today's Date: 03/02/2016    History of Present Illness pt presents following a Syncopal Episode.  pt with hx of HTN, CAD, MI, CVA, DM, Bil TKRs, and BPPV (pt denies).      PT Comments    Patient reports less dizziness today.  None during mobility.  Progressing toward goals.  Agree with f/u OP PT for vestibular rehab at d/c.  Follow Up Recommendations  Supervision - Intermittent;Outpatient PT (for Vestibular Rehab)     Equipment Recommendations  None recommended by PT    Recommendations for Other Services       Precautions / Restrictions Precautions Precautions: Fall Restrictions Weight Bearing Restrictions: No    Mobility  Bed Mobility Overal bed mobility: Modified Independent             General bed mobility comments: Moves slowly for safety and to minimize dizziness  Transfers Overall transfer level: Needs assistance Equipment used: None Transfers: Sit to/from Stand Sit to Stand: Supervision         General transfer comment: Supervision for safety only.  No report of dizziness.  Good balance.  Ambulation/Gait Ambulation/Gait assistance: Supervision Ambulation Distance (Feet): 60 Feet Assistive device: None Gait Pattern/deviations: Step-through pattern;Decreased stride length Gait velocity: decreased Gait velocity interpretation: Below normal speed for age/gender General Gait Details: Patient with slow, steady gait pattern.  No loss of balance.  No dizziness reported.   Stairs            Wheelchair Mobility    Modified Rankin (Stroke Patients Only)       Balance           Standing balance support: No upper extremity supported Standing balance-Leahy Scale: Good                      Cognition Arousal/Alertness: Awake/alert Behavior During Therapy: WFL for tasks assessed/performed Overall Cognitive Status: Within Functional  Limits for tasks assessed                      Exercises      General Comments        Pertinent Vitals/Pain Pain Assessment: Faces Faces Pain Scale: Hurts little more Pain Location: Lt shoulder and neck Pain Descriptors / Indicators: Sore;Tightness Pain Intervention(s): Monitored during session;Repositioned    Home Living                      Prior Function            PT Goals (current goals can now be found in the care plan section) Acute Rehab PT Goals Patient Stated Goal: Home Progress towards PT goals: Progressing toward goals    Frequency    Min 3X/week      PT Plan Current plan remains appropriate    Co-evaluation             End of Session Equipment Utilized During Treatment: Gait belt Activity Tolerance: Patient tolerated treatment well Patient left: in bed;with call bell/phone within reach (sitting EOB)     Time: [id number] PT Time Calculation (min) (ACUTE ONLY): 17 min  Charges:                       G Codes:  Functional Assessment Tool Used: Clinical Judgement Functional Limitation: Mobility: Walking and moving around Mobility: Walking and Moving Around Goal Status [id number]): 0 percent impaired,  limited or restricted Mobility: Walking and Moving Around Discharge Status [phone]): At least 1 percent but less than 20 percent impaired, limited or restricted   Despina Pole 03/02/2016, 1:15 PM Carita Pian. Sanjuana Kava, [place] Pager [phone]

## 2016-03-02 NOTE — Progress Notes (Signed)
Pt ride here, pt d/c'd via wheelchair with belongings, escorted by unit NT.

## 2016-03-02 NOTE — Progress Notes (Addendum)
D/c instructions reviewed with pt, copy of instructions and script given to pt. Pt has called for her ride that will be picking her up.  Waiting for ride to arrive.

## 2016-03-06 ENCOUNTER — Telehealth: Payer: Self-pay | Admitting: *Deleted

## 2016-03-06 NOTE — Telephone Encounter (Addendum)
Unable to reach patient at time of TCM Call. No answer, no voicemail.  Pt needs 30 minute hospital follow-up scheduled on or before 03/16/16.

## 2016-03-07 NOTE — Telephone Encounter (Signed)
May schedule MOnday nov 20th at 11am 30 min appt

## 2016-03-07 NOTE — Telephone Encounter (Signed)
Kristen [name], this pt requests TCM appt w/ PCP only. I do not see any 30 min openings w/ PCP within TCM window. Could you review his schedule and reach out to the pt regarding scheduling an appt please? There are several other providers who have 30 min openings, but the pt declined to schedule w/ another provider. Thanks!

## 2016-03-07 NOTE — Telephone Encounter (Signed)
Transition Care Management Follow-up Telephone Call  Per Discharge Summary:  PCP: [name], MD  Admit date: 02/29/2016 Discharge date: 03/02/2016  Admitted From: Home  Disposition:  Home   Recommendations for Outpatient Follow-up:  1. Follow up with PCP in 1-2 weeks 2. Please obtain BMP/CBC in one week 3. Needs further adjustment of DM medications regimen.  4. Needs PT , vestibular rehab.  5. Consider referral to cardiology for further evaluation of syncope,   Discharge Condition: stable.  CODE STATUS: full code.  Diet recommendation: Heart Healthy  --   How have you been since you were released from the hospital? "I feel pretty good but today is just one of my bad days." Pt states she has a headache right now and is laying down.   Do you understand why you were in the hospital? yes   Do you understand the discharge instructions? yes   Where were you discharged to? Home  Items Reviewed:  Medications reviewed: no, pt does not know the names of her medications and was in bed at time of call and did not want to get up to get them. 'I just take what I'm supposed to take.'  Allergies reviewed: yes  Dietary changes reviewed: yes, heart healthy  Referrals reviewed: yes, pt states she is supposed to do 'some sort of therapy in [place]' but she could not elaborate on type/purpose of therapy. Perhaps PT/vestibular rehab recommended in d/c summary?   Functional Questionnaire:   Activities of Daily Living (ADLs):   She states they are independent in the following: ambulation, bathing and hygiene, feeding, continence, grooming, toileting and dressing States they require assistance with the following: none   Any transportation issues/concerns?: no   Any patient concerns? no   Confirmed importance and date/time of follow-up visits scheduled no, pt only wants to see PCP, who does not have any 30 minute appts during the next week. Will forward to on-site  Health Coach for scheduling.  Confirmed with patient if condition begins to worsen call PCP or go to the ER.  Patient was given the office number and encouraged to call back with question or concerns.  : yes

## 2016-03-08 NOTE — Telephone Encounter (Signed)
[facility] notified and appt scheduled.

## 2016-03-13 ENCOUNTER — Encounter: Payer: Self-pay | Admitting: Family Medicine

## 2016-03-13 ENCOUNTER — Ambulatory Visit (INDEPENDENT_AMBULATORY_CARE_PROVIDER_SITE_OTHER): Payer: Medicare Other | Admitting: Family Medicine

## 2016-03-13 VITALS — BP 144/84 | HR 88 | Temp 98.2°F | Wt 203.8 lb

## 2016-03-13 DIAGNOSIS — R55 Syncope and collapse: Secondary | ICD-10-CM

## 2016-03-13 DIAGNOSIS — E1165 Type 2 diabetes mellitus with hyperglycemia: Secondary | ICD-10-CM

## 2016-03-13 DIAGNOSIS — M1712 Unilateral primary osteoarthritis, left knee: Secondary | ICD-10-CM

## 2016-03-13 DIAGNOSIS — M25432 Effusion, left wrist: Secondary | ICD-10-CM | POA: Diagnosis not present

## 2016-03-13 DIAGNOSIS — IMO0002 Reserved for concepts with insufficient information to code with codable children: Secondary | ICD-10-CM

## 2016-03-13 DIAGNOSIS — M25532 Pain in left wrist: Secondary | ICD-10-CM

## 2016-03-13 DIAGNOSIS — S060X9D Concussion with loss of consciousness of unspecified duration, subsequent encounter: Secondary | ICD-10-CM

## 2016-03-13 DIAGNOSIS — I10 Essential (primary) hypertension: Secondary | ICD-10-CM

## 2016-03-13 DIAGNOSIS — E118 Type 2 diabetes mellitus with unspecified complications: Secondary | ICD-10-CM

## 2016-03-13 LAB — CBC WITH DIFFERENTIAL/PLATELET
BASOS ABS: 0 10*3/uL (ref 0.0–0.1)
BASOS PCT: 0.4 % (ref 0.0–3.0)
Eosinophils Absolute: 0 10*3/uL (ref 0.0–0.7)
Eosinophils Relative: 0.5 % (ref 0.0–5.0)
HEMATOCRIT: 37.8 % (ref 36.0–46.0)
Hemoglobin: 12.1 g/dL (ref 12.0–15.0)
LYMPHS PCT: 29 % (ref 12.0–46.0)
Lymphs Abs: 2.7 10*3/uL (ref 0.7–4.0)
MCHC: 31.8 g/dL (ref 30.0–36.0)
MCV: 82.7 fl (ref 78.0–100.0)
Monocytes Absolute: 0.3 10*3/uL (ref 0.1–1.0)
Monocytes Relative: 3.7 % (ref 3.0–12.0)
NEUTROS ABS: 6.3 10*3/uL (ref 1.4–7.7)
Neutrophils Relative %: 66.4 % (ref 43.0–77.0)
PLATELETS: 269 10*3/uL (ref 150.0–400.0)
RBC: 4.58 Mil/uL (ref 3.87–5.11)
RDW: 17.3 % — ABNORMAL HIGH (ref 11.5–15.5)
WBC: 9.4 10*3/uL (ref 4.0–10.5)

## 2016-03-13 LAB — BASIC METABOLIC PANEL
BUN: 19 mg/dL (ref 6–23)
CHLORIDE: 99 meq/L (ref 96–112)
CO2: 28 mEq/L (ref 19–32)
CREATININE: 1.03 mg/dL (ref 0.40–1.20)
Calcium: 9.5 mg/dL (ref 8.4–10.5)
GFR: 67.26 mL/min (ref >60.00)
GLUCOSE: 311 mg/dL — AB (ref 70–99)
Potassium: 3.5 mEq/L (ref 3.5–5.1)
Sodium: 137 mEq/L (ref 135–145)

## 2016-03-13 LAB — SEDIMENTATION RATE: SED RATE: 59 mm/h — AB (ref 0–30)

## 2016-03-13 NOTE — Progress Notes (Signed)
BP (!) 144/84   Pulse 88   Temp 98.2 F (36.8 C) (Oral)   Wt 203 lb 12 oz (92.4 kg)   BMI 37.27 kg/m    CC: hosp f/u visit Subjective:    Patient ID: Kristen [name], female    DOB: 02-28-1941, 75 y.o.   MRN: [id number]  HPI: Kristen [name] is a 75 y.o. female presenting on 03/13/2016 for Follow-up   Recent hospitalization for weakness and faintness with syncope - fell onto L side, hit shoulder and head. Hospital records reviewed. Workup unrevealing - CT head with small vessel ischemic changes without acute injury; CTA chest without PE; MRI negative; stable echo and EKG; Cr 1.4, WBC 14k. Thought orthostatic vs vasovagal syncope. BP meds were adjusted - diuretics were held. Diagnosed with concussion as well. She was set up with vestibular therapy - appt tomorrow.   Ongoing headache and L shoulder pain.   Prior to fall, she had just completed prednisone course for presumed gout flare of L wrist. ESR 120. Ongoing marked weakness of left hand as well as point tenderness palpation medial ventral wrist. Denies numbness or paresthesias.   Would like to return to see ortho because of worsening L knee unsteadiness since fall, no pain at rest but does hurt to walk. Worried given h/o knee replacement ([place]).   Lab Results  Component Value Date   HGBA1C 8.6 (H) 02/29/2016     Admit date: 02/29/2016 Discharge date: 03/02/2016 TCM f/u phone call done: 03/06/2016  Recommendations for Outpatient Follow-up:  1. Follow up with PCP in 1-2 weeks 2. Please obtain BMP/CBC in one week 3. Needs further adjustment of DM medications regimen.  4. Needs PT , vestibular rehab.  5. Consider referral to cardiology for further evaluation of syncope,  Discharge Condition: stable.  CODE STATUS: full code.  Diet recommendation: Heart Healthy  Relevant past medical, surgical, family and social history reviewed and updated as indicated. Interim medical history since our last visit reviewed. Allergies  and medications reviewed and updated. Current Outpatient Prescriptions on File Prior to Visit  Medication Sig  . amLODipine (NORVASC) 5 MG tablet TAKE 1 TABLET (5 MG TOTAL) BY MOUTH DAILY.  Marland Kitchen Blood Glucose Monitoring Suppl (ONE TOUCH ULTRA SYSTEM KIT) w/Device KIT 1 kit by Does not apply route once.  Marland Kitchen glimepiride (AMARYL) 4 MG tablet Take 4 mg by mouth daily with breakfast.  . glucose blood test strip Ck blood sugar twice a day and as directed.  [name] DELICA LANCETS 74B MISC Check blood sugar twice a day and as directed.  . simvastatin (ZOCOR) 20 MG tablet Take 20 mg by mouth daily.  . sitaGLIPtin (JANUVIA) 100 MG tablet Take 1 tablet (100 mg total) by mouth daily.  . traMADol (ULTRAM) 50 MG tablet Take 1 tablet (50 mg total) by mouth every 6 (six) hours as needed.  . nitroGLYCERIN (NITROSTAT) 0.4 MG SL tablet Place 0.4 mg under the tongue every 5 (five) minutes as needed for chest pain.   No current facility-administered medications on file prior to visit.     Review of Systems Per HPI unless specifically indicated in ROS section     Objective:    BP (!) 144/84   Pulse 88   Temp 98.2 F (36.8 C) (Oral)   Wt 203 lb 12 oz (92.4 kg)   BMI 37.27 kg/m   Wt Readings from Last 3 Encounters:  03/13/16 203 lb 12 oz (92.4 kg)  03/02/16 205 lb 8  oz (93.2 kg)  02/21/16 206 lb 8 oz (93.7 kg)    Physical Exam  Constitutional: She appears well-developed and well-nourished. No distress.  HENT:  Mouth/Throat: Oropharynx is clear and moist. No oropharyngeal exudate.  Eyes: Conjunctivae are normal. Pupils are equal, round, and reactive to light.  Cardiovascular: Normal rate, regular rhythm, normal heart sounds and intact distal pulses.   No murmur heard. Pulmonary/Chest: Effort normal and breath sounds normal. No respiratory distress. She has no wheezes. She has no rales.  Musculoskeletal: She exhibits no edema.  Mild edema L wrist without calor or erythema Tender to palpation medial  ventral wrist without significant pain at thumb extensor longus or scaphoid L knee - without pain to palpation of any landmarks, full ROM flexion/extension, mildly antalgic gait, no popliteal fullness.   Skin: Skin is warm and dry. No rash noted. No erythema.  Nursing note and vitals reviewed.      Assessment & Plan:   Problem List Items Addressed This Visit    Concussion    Dx with concussion during hospitalization, ongoing mild headache but slowly improving.       Diabetes type 2, uncontrolled ([place])    In setting of recent prednisone course. No med changes today. Will reassess control at f/u visit.      Hypertension, uncontrolled    bp mildly elevated today - off diuretic. Continue only amlodipine       Pain and swelling of left wrist    Presumed gout flare - improved with prednisone course but persistent swelling and weakness of wrist/hand due to pain. Restart colchicine 0.41m PRN. Will also order MR hand to further eval cause of ongoing symptoms.       Relevant Orders   Sedimentation rate   Cyclic citrul peptide antibody, IgG   MR WRIST LEFT W WO CONTRAST   Primary osteoarthritis of left knee    Exam overall benign. Will refer to ortho for further eval after recent fall in setting of knee replacement last year per patient's request.       Relevant Orders   Ambulatory referral to Orthopedic Surgery   Syncope - Primary    Unclear cause, hospital workup unrevealing. Check labs today. If recurrent, low threshold to refer to cards.       Relevant Orders   CBC with Differential/Platelet   Basic metabolic panel       Follow up plan: Return in about 6 weeks (around 04/24/2016) for follow up visit.  JRia Bush MD

## 2016-03-13 NOTE — Progress Notes (Signed)
Pre visit review using our clinic review tool, if applicable. No additional management support is needed unless otherwise documented below in the visit note. 

## 2016-03-13 NOTE — Assessment & Plan Note (Signed)
bp mildly elevated today - off diuretic. Continue only amlodipine

## 2016-03-13 NOTE — Assessment & Plan Note (Signed)
Unclear cause, hospital workup unrevealing. Check labs today. If recurrent, low threshold to refer to cards.

## 2016-03-13 NOTE — Assessment & Plan Note (Signed)
Presumed gout flare - improved with prednisone course but persistent swelling and weakness of wrist/hand due to pain. Restart colchicine 0.6mg  PRN. Will also order MR hand to further eval cause of ongoing symptoms.

## 2016-03-13 NOTE — Patient Instructions (Addendum)
I'm glad you're feeling better.  Labs today.  Colchicine for discomfort at wrist - may take one a day as needed. We will order MRI of left wrist for further evaluation.  Return in 1-2 months for follow up

## 2016-03-13 NOTE — Assessment & Plan Note (Signed)
Exam overall benign. Will refer to ortho for further eval after recent fall in setting of knee replacement last year per patient's request.

## 2016-03-13 NOTE — Assessment & Plan Note (Signed)
Dx with concussion during hospitalization, ongoing mild headache but slowly improving.

## 2016-03-13 NOTE — Assessment & Plan Note (Signed)
In setting of recent prednisone course. No med changes today. Will reassess control at f/u visit.

## 2016-03-14 ENCOUNTER — Ambulatory Visit: Payer: Medicare Other | Attending: Internal Medicine | Admitting: Physical Therapy

## 2016-03-14 DIAGNOSIS — R42 Dizziness and giddiness: Secondary | ICD-10-CM

## 2016-03-14 LAB — CYCLIC CITRUL PEPTIDE ANTIBODY, IGG: CYCLIC CITRULLIN PEPTIDE AB: 18 U

## 2016-03-15 NOTE — Therapy (Signed)
[facility] [address] [place], [place], [address] Phone: [phone]   Fax:  [phone]  Physical Therapy Evaluation  Patient Details  Name: Kristen [name] MRN: [id number] Date of Birth: Dec 04, 1940 Referring Provider: Dr. Niel Hummer  Encounter Date: 03/14/2016      PT End of Session - 03/15/16 1239    Visit Number 1   Number of Visits 1   Authorization Type [employer] Medicare   PT Start Time 1024   PT Stop Time 1100   PT Time Calculation (min) 36 min      Past Medical History:  Diagnosis Date  . Abdominal aortic atherosclerosis ([place]) by CT 02/2014  . CAD (coronary artery disease)    by CT, per pt h/o MI  . Diabetes type 2, uncontrolled ([place])   . Frequent headaches   . GERD (gastroesophageal reflux disease)   . History of pulmonary embolism 2012  . HLD (hyperlipidemia)   . HTN (hypertension)   . 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 Morbid obesity ([place])   . Myocardial infarction 2012   per pt. report, states she was treated with medicine, [facility] at [facility]    . Osteoarthritis    knees  . Primary localized osteoarthritis of left knee 09/22/2014  . Sleep apnea 2011   study done in [place], states that since she lost weight she doesn't use the CPAP any longer & she doesn't have a problem with sleep apnea  . Stroke [facility])    still has balance problem on occas. , uses cane but that's mainly for the left knee pain  . Syncope 03/01/2016  . Thoracic aortic atherosclerosis ([place]) 12/2015   by CXR  . Vertigo    hx. benign postitional postural    Past Surgical History:  Procedure Laterality Date  . CATARACT EXTRACTION Bilateral 2013  . EYE SURGERY     /w IOL  . FOOT SURGERY Right   . PARTIAL HYSTERECTOMY     for fibroids, ovaries remain  . TONSILLECTOMY    . TOTAL KNEE ARTHROPLASTY Right 1990s  . TOTAL KNEE  ARTHROPLASTY Left 09/22/2014   [name], MD  . TUBAL LIGATION      There were no vitals filed for this visit.       Subjective Assessment - 03/15/16 1230    Subjective Pt reports she fell on 02-29-16 (syncope) and fell and hit L side of head; was treated for BPPV in hospital  - has not been having any vertigo but states "I want to make sure it's gone"   Pertinent History h/o CVA and MI:  PE 05/2015:  HTN:  DM Type 2   Patient Stated Goals "to get the crystals back in place"   Currently in Pain? No/denies            [name] PT Assessment - 03/15/16 0001      Assessment   Medical Diagnosis Syncope/Vertigo   Referring Provider Dr. Niel Hummer   Onset Date/Surgical Date 02/29/16   Prior Therapy was treated with Epley while in hospital     Precautions   Precautions Other (comment)  Vertigo     Restrictions   Weight Bearing Restrictions No     Balance Screen   Has the patient fallen in the past 6 months Yes   How many times? 1   Has the patient had a decrease in  activity level because of a fear of falling?  No   Is the patient reluctant to leave their home because of a fear of falling?  No            Vestibular Assessment - 03/15/16 0001      Vestibular Assessment   General Observation Pt is a 75 year old lady who had syncopal episode on 02-29-16 resulting in fall and hitting L side of head:  pt states she was treated for vertigo while hospitalized and is no longer having the vertigo but wanted to be sure it was resolved     Symptom Behavior   Type of Dizziness Spinning  initially but not at this time     Occulomotor Exam   Occulomotor Alignment Normal     Positional Testing   Dix-Hallpike Dix-Hallpike Right;Dix-Hallpike Left   Sidelying Test Sidelying Right;Sidelying Left     Dix-Hallpike Right   Dix-Hallpike Right Duration none   Dix-Hallpike Right Symptoms No nystagmus     Dix-Hallpike Left   Dix-Hallpike Left Duration none   Dix-Hallpike Left  Symptoms No nystagmus     Sidelying Right   Sidelying Right Duration none   Sidelying Right Symptoms No nystagmus     Sidelying Left   Sidelying Left Duration none   Sidelying Left Symptoms No nystagmus                                   Plan - 03/15/16 1240    Clinical Impression Statement Pt is a 75 year old lady who reported having spinning vertigo after falling and hitting her head on 02-29-16: pt states she was treated with 1 rep of Epley's maneuver while hospitalized and has not had any dizziness since she has been home; pt appears to have had L BPPV which has resolved as of current time   PT Frequency 1x / week   PT Duration --  eval only as no c/o vertigo persist   PT Treatment/Interventions Other (comment)  Initial eval only   PT Next Visit Plan N/A   Consulted and Agree with Plan of Care Patient      Patient will benefit from skilled therapeutic intervention in order to improve the following deficits and impairments:     Visit Diagnosis: Dizziness and giddiness - Plan: PT plan of care cert/re-cert      G-Codes - [id number]    Functional Assessment Tool Used Clinical Judgment - no vertigo provoked with positional testing   Functional Limitation Changing and maintaining body position   Changing and Maintaining Body Position Current Status [id number]) 0 percent impaired, limited or restricted   Changing and Maintaining Body Position Goal Status [id number]) 0 percent impaired, limited or restricted   Changing and Maintaining Body Position Discharge Status [id number]) 0 percent impaired, limited or restricted       Problem List Patient Active Problem List   Diagnosis Date Noted  . Syncope 02/29/2016  . Concussion 02/29/2016  . Pain and swelling of left wrist 02/07/2016  . Hemoptysis 12/31/2015  . Thoracic aortic atherosclerosis ([place]) 12/24/2015  . History of CVA (cerebrovascular accident) 11/03/2015  . Abdominal aortic atherosclerosis ([place])   .  Anemia 06/18/2015  . Sensorineural hearing loss (SNHL) of both ears 03/29/2015  . Axillary mass 03/10/2015  . Pain of right middle finger 12/22/2014  . Memory deficit 12/04/2014  . Primary osteoarthritis of left knee 01/27/2014  . Advanced  care planning/counseling discussion 12/11/2013  . Medicare annual wellness visit, subsequent 12/11/2013  . Health maintenance examination 12/11/2013  . CAD (coronary artery disease)/coronary calcifications   . Chest pain 07/17/2013  . Hair loss 05/26/2013  . Benign paroxysmal positional vertigo 05/26/2013  . HLD (hyperlipidemia)   . Obesity, Class II, BMI 35-39.9, with comorbidity ([place])   . History of pulmonary embolism   . Osteoarthritis   . Diabetes type 2, uncontrolled ([place])   . History of DVT (deep vein thrombosis) 12/21/2010  . Hypertension, uncontrolled 05/13/2009    Alda Lea, PT 03/15/2016, 12:49 PM  [facility] [address] [place], [place], [address] Phone: [phone]   Fax:  [phone]  Name: Kristen [name] MRN: [id number] Date of Birth: August 05, 1940

## 2016-03-20 ENCOUNTER — Ambulatory Visit (HOSPITAL_COMMUNITY)
Admission: RE | Admit: 2016-03-20 | Discharge: 2016-03-20 | Disposition: A | Discharge status: Home / Self Care | Payer: Medicare Other | Source: Ambulatory Visit | Attending: Family Medicine | Admitting: Family Medicine

## 2016-03-20 DIAGNOSIS — M7989 Other specified soft tissue disorders: Secondary | ICD-10-CM | POA: Diagnosis not present

## 2016-03-20 DIAGNOSIS — M12832 Other specific arthropathies, not elsewhere classified, left wrist: Secondary | ICD-10-CM | POA: Diagnosis not present

## 2016-03-20 DIAGNOSIS — M25532 Pain in left wrist: Secondary | ICD-10-CM | POA: Insufficient documentation

## 2016-03-20 DIAGNOSIS — M109 Gout, unspecified: Secondary | ICD-10-CM | POA: Diagnosis not present

## 2016-03-20 DIAGNOSIS — M25432 Effusion, left wrist: Secondary | ICD-10-CM | POA: Diagnosis not present

## 2016-03-20 DIAGNOSIS — M659 Synovitis and tenosynovitis, unspecified: Secondary | ICD-10-CM | POA: Insufficient documentation

## 2016-03-20 MED ORDER — GADOBENATE DIMEGLUMINE 529 MG/ML IV SOLN
19.0000 mL | Freq: Once | INTRAVENOUS | Status: AC | PRN
  Administered 2016-03-20: 19 mL via INTRAVENOUS

## 2016-03-22 DIAGNOSIS — Z96652 Presence of left artificial knee joint: Secondary | ICD-10-CM | POA: Diagnosis not present

## 2016-03-24 ENCOUNTER — Other Ambulatory Visit: Payer: Self-pay | Admitting: Family Medicine

## 2016-03-24 DIAGNOSIS — M1120 Other chondrocalcinosis, unspecified site: Secondary | ICD-10-CM | POA: Insufficient documentation

## 2016-03-24 DIAGNOSIS — M19032 Primary osteoarthritis, left wrist: Secondary | ICD-10-CM

## 2016-03-24 DIAGNOSIS — M109 Gout, unspecified: Secondary | ICD-10-CM

## 2016-03-24 MED ORDER — COLCHICINE 0.6 MG PO TABS: 0.6000 mg | ORAL_TABLET | Freq: Every day | ORAL | 3 refills | Status: DC

## 2016-03-27 ENCOUNTER — Other Ambulatory Visit: Payer: Self-pay | Admitting: *Deleted

## 2016-03-27 MED ORDER — COLCHICINE 0.6 MG PO TABS: 0.6000 mg | ORAL_TABLET | Freq: Every day | ORAL | 6 refills | Status: DC

## 2016-04-05 DIAGNOSIS — R700 Elevated erythrocyte sedimentation rate: Secondary | ICD-10-CM | POA: Diagnosis not present

## 2016-04-05 DIAGNOSIS — M25432 Effusion, left wrist: Secondary | ICD-10-CM | POA: Diagnosis not present

## 2016-04-07 ENCOUNTER — Other Ambulatory Visit: Payer: Self-pay | Admitting: Family Medicine

## 2016-04-13 DIAGNOSIS — M25432 Effusion, left wrist: Secondary | ICD-10-CM | POA: Diagnosis not present

## 2016-05-01 ENCOUNTER — Ambulatory Visit: Payer: Medicare Other | Admitting: Family Medicine

## 2016-07-07 ENCOUNTER — Other Ambulatory Visit: Payer: Self-pay | Admitting: Family Medicine

## 2016-07-25 ENCOUNTER — Other Ambulatory Visit: Payer: Self-pay | Admitting: Family Medicine

## 2016-07-25 DIAGNOSIS — Z1231 Encounter for screening mammogram for malignant neoplasm of breast: Secondary | ICD-10-CM

## 2016-08-08 ENCOUNTER — Other Ambulatory Visit: Payer: Self-pay | Admitting: Family Medicine

## 2016-08-08 DIAGNOSIS — E118 Type 2 diabetes mellitus with unspecified complications: Secondary | ICD-10-CM

## 2016-08-08 DIAGNOSIS — E559 Vitamin D deficiency, unspecified: Secondary | ICD-10-CM

## 2016-08-08 DIAGNOSIS — M109 Gout, unspecified: Secondary | ICD-10-CM

## 2016-08-08 DIAGNOSIS — IMO0002 Reserved for concepts with insufficient information to code with codable children: Secondary | ICD-10-CM

## 2016-08-08 DIAGNOSIS — E785 Hyperlipidemia, unspecified: Secondary | ICD-10-CM

## 2016-08-08 DIAGNOSIS — E1165 Type 2 diabetes mellitus with hyperglycemia: Secondary | ICD-10-CM

## 2016-08-09 ENCOUNTER — Other Ambulatory Visit: Payer: Medicare Other

## 2016-08-09 ENCOUNTER — Ambulatory Visit (INDEPENDENT_AMBULATORY_CARE_PROVIDER_SITE_OTHER): Payer: Medicare Other

## 2016-08-09 VITALS — BP 132/86 | HR 55 | Temp 98.0°F | Ht 61.0 in | Wt 205.5 lb

## 2016-08-09 DIAGNOSIS — IMO0002 Reserved for concepts with insufficient information to code with codable children: Secondary | ICD-10-CM

## 2016-08-09 DIAGNOSIS — E1165 Type 2 diabetes mellitus with hyperglycemia: Secondary | ICD-10-CM

## 2016-08-09 DIAGNOSIS — M109 Gout, unspecified: Secondary | ICD-10-CM

## 2016-08-09 DIAGNOSIS — E118 Type 2 diabetes mellitus with unspecified complications: Secondary | ICD-10-CM

## 2016-08-09 DIAGNOSIS — E559 Vitamin D deficiency, unspecified: Secondary | ICD-10-CM

## 2016-08-09 DIAGNOSIS — E785 Hyperlipidemia, unspecified: Secondary | ICD-10-CM

## 2016-08-09 DIAGNOSIS — Z0000 Encounter for general adult medical examination without abnormal findings: Secondary | ICD-10-CM | POA: Diagnosis not present

## 2016-08-09 LAB — VITAMIN D 25 HYDROXY (VIT D DEFICIENCY, FRACTURES): VITD: 21.54 ng/mL — AB (ref 30.00–100.00)

## 2016-08-09 LAB — MICROALBUMIN / CREATININE URINE RATIO
CREATININE, U: 176.1 mg/dL
MICROALB/CREAT RATIO: 4.4 mg/g (ref 0.0–30.0)
Microalb, Ur: 7.8 mg/dL — ABNORMAL HIGH (ref 0.0–1.9)

## 2016-08-09 LAB — LIPID PANEL
CHOL/HDL RATIO: 3
Cholesterol: 156 mg/dL (ref 0–200)
HDL: 46.7 mg/dL (ref >39.00)
LDL CALC: 93 mg/dL (ref 0–99)
NONHDL: 109.39
TRIGLYCERIDES: 80 mg/dL (ref 0.0–149.0)
VLDL: 16 mg/dL (ref 0.0–40.0)

## 2016-08-09 LAB — COMPREHENSIVE METABOLIC PANEL
ALT: 13 U/L (ref 0–35)
AST: 15 U/L (ref 0–37)
Albumin: 3.7 g/dL (ref 3.5–5.2)
Alkaline Phosphatase: 97 U/L (ref 39–117)
BUN: 16 mg/dL (ref 6–23)
CALCIUM: 9.8 mg/dL (ref 8.4–10.5)
CHLORIDE: 99 meq/L (ref 96–112)
CO2: 32 meq/L (ref 19–32)
CREATININE: 0.91 mg/dL (ref 0.40–1.20)
GFR: 77.5 mL/min (ref >60.00)
Glucose, Bld: 178 mg/dL — ABNORMAL HIGH (ref 70–99)
Potassium: 3.3 mEq/L — ABNORMAL LOW (ref 3.5–5.1)
Sodium: 138 mEq/L (ref 135–145)
Total Bilirubin: 0.7 mg/dL (ref 0.2–1.2)
Total Protein: 7.5 g/dL (ref 6.0–8.3)

## 2016-08-09 LAB — HEMOGLOBIN A1C: Hgb A1c MFr Bld: 10.1 % — ABNORMAL HIGH (ref 4.6–6.5)

## 2016-08-09 LAB — URIC ACID: URIC ACID, SERUM: 5.5 mg/dL (ref 2.4–7.0)

## 2016-08-09 NOTE — Progress Notes (Signed)
Subjective:   Kristen [name] is a 76 y.o. female who presents for Medicare Annual (Subsequent) preventive examination.  Review of Systems:  N/A Cardiac Risk Factors include: advanced age (>26mn, >>33women);obesity (BMI >30kg/m2);diabetes mellitus;dyslipidemia     Objective:     Vitals: BP 132/86 (BP Location: Right Arm, Patient Position: Sitting, Cuff Size: Large)   Pulse (!) 55   Temp 98 F (36.7 C) (Oral)   Ht 5' 1" (1.549 m) Comment: no shoes  Wt 205 lb 8 oz (93.2 kg)   SpO2 98%   BMI 38.83 kg/m   Body mass index is 38.83 kg/m.   Tobacco History  Smoking Status  . Never Smoker  Smokeless Tobacco  . Never Used    Comment: tobacco use- no      Counseling given: No   Past Medical History:  Diagnosis Date  . Abdominal aortic atherosclerosis ([place] 02/2014  . CAD (coronary artery disease)    by CT, per pt h/o MI  . Diabetes type 2, uncontrolled ([place]   . Frequent headaches   . GERD (gastroesophageal reflux disease)   . History of pulmonary embolism 2012  . HLD (hyperlipidemia)   . HTN (hypertension)   . 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 Morbid obesity ([place]   . Myocardial infarction ([facility] 2012   per pt. report, [facility]   . Osteoarthritis    knees  . Primary localized osteoarthritis of left knee 09/22/2014  . Sleep apnea 2011   [place] that since she lost weight she doesn't use the CPAP any longer & she doesn't have a problem with sleep apnea  . Stroke ([facility]    still has balance problem on occas. , uses cane but that's mainly for the left knee pain  . Syncope 03/01/2016  . Thoracic aortic atherosclerosis ([place] 12/2015   by CXR  . Vertigo    hx. benign postitional postural   Past Surgical History:  Procedure Laterality Date  . CATARACT EXTRACTION Bilateral 2013  . EYE SURGERY     /w IOL    . FOOT SURGERY Right   . PARTIAL HYSTERECTOMY     for fibroids, ovaries remain  . TONSILLECTOMY    . TOTAL KNEE ARTHROPLASTY Right 1990s  . TOTAL KNEE ARTHROPLASTY Left 09/22/2014   JMarchia Bond MD  . TUBAL LIGATION     Family History  Problem Relation Age of Onset  . Cancer Mother     bone  . Diabetes Father   . Hypertension Father   . Cancer Son 31    lung  . Congenital heart disease Son 138  History  Sexual Activity  . Sexual activity: Not on file    Outpatient Encounter Prescriptions as of 08/09/2016  Medication Sig  . Blood Glucose Monitoring Suppl (ONE TOUCH ULTRA SYSTEM KIT) w/Device KIT 1 kit by Does not apply route once.  .Marland Kitchenglimepiride (AMARYL) 4 MG tablet TAKE 1 TABLET (4 MG TOTAL) BY MOUTH DAILY WITH BREAKFAST.  .Marland Kitchenglucose blood test strip Ck blood sugar twice a day and as directed.  .Marland Kitchenlosartan-hydrochlorothiazide (HYZAAR) 100-12.5 MG tablet Take 1 tablet by mouth daily.  . nitroGLYCERIN (NITROSTAT) 0.4 MG SL tablet Place 0.4 mg under the tongue every 5 (five) minutes as needed for chest pain.  .Glory RosebushDELICA LANCETS 326O  MISC Check blood sugar twice a day and as directed.  . simvastatin (ZOCOR) 20 MG tablet TAKE 1 TABLET (20 MG TOTAL) BY MOUTH DAILY.  . [DISCONTINUED] amLODipine (NORVASC) 5 MG tablet TAKE 1 TABLET (5 MG TOTAL) BY MOUTH DAILY.  . [DISCONTINUED] colchicine 0.6 MG tablet Take 1 tablet (0.6 mg total) by mouth daily.  . [DISCONTINUED] glimepiride (AMARYL) 4 MG tablet Take 4 mg by mouth daily with breakfast.  . [DISCONTINUED] simvastatin (ZOCOR) 20 MG tablet Take 20 mg by mouth daily.  . [DISCONTINUED] sitaGLIPtin (JANUVIA) 100 MG tablet Take 1 tablet (100 mg total) by mouth daily.  . [DISCONTINUED] traMADol (ULTRAM) 50 MG tablet Take 1 tablet (50 mg total) by mouth every 6 (six) hours as needed.   No facility-administered encounter medications on file as of 08/09/2016.     Activities of Daily Living In your present state of health, do you have any  difficulty performing the following activities: 08/09/2016 03/01/2016  Hearing? N N  Vision? N N  Difficulty concentrating or making decisions? Y N  Walking or climbing stairs? Y Y  Dressing or bathing? N N  Doing errands, shopping? N N  Preparing Food and eating ? N -  Using the Toilet? N -  In the past six months, have you accidently leaked urine? N -  Do you have problems with loss of bowel control? N -  Managing your Medications? N -  Managing your Finances? N -  Housekeeping or managing your Housekeeping? N -  Some recent data might be hidden    Patient Care Team: [name], MD as PCP - General (Family Medicine) Minna Merritts, MD as Consulting Physician (Cardiology)    Assessment:     Hearing Screening   125Hz 250Hz 500Hz 1000Hz 2000Hz 3000Hz 4000Hz 6000Hz 8000Hz  Right ear:   40 0 40  0    Left ear:   0 0 40  0      Visual Acuity Screening   Right eye Left eye Both eyes  Without correction: 20/25 20/25 20/25  With correction:       Exercise Activities and Dietary recommendations Current Exercise Habits: The patient does not participate in regular exercise at present, Exercise limited by: None identified  Goals    . chronic health management          Starting 08/09/16, I will continue to take medications as prescribed to manage health conditions.      Fall Risk Fall Risk  08/09/2016 03/10/2015 12/11/2013  Falls in the past year? No No Yes  Number falls in past yr: - - 1  Injury with Fall? - - No   Depression Screen PHQ 2/9 Scores 08/09/2016 03/10/2015 12/11/2013  PHQ - 2 Score 0 0 0     Cognitive Function MMSE - Mini Mental State Exam 08/09/2016  Orientation to time 5  Orientation to Place 5  Registration 3  Attention/ Calculation 0  Recall 3  Language- name 2 objects 0  Language- repeat 1  Language- follow 3 step command 3  Language- read & follow direction 0  Write a sentence 0  Copy design 0  Total score 20   PLEASE NOTE: A Mini-Cog  screen was completed. Maximum score is 20. A value of 0 denotes this part of Folstein MMSE was not completed or the patient failed this part of the Mini-Cog screening.   Mini-Cog Screening Orientation to Time - Max 5 pts Orientation to Place - Max 5 pts Registration -  Max 3 pts Recall - Max 3 pts Language Repeat - Max 1 pts Language Follow 3 Step Command - Max 3 pts     Immunization History  Administered Date(s) Administered  . PPD Test 09/25/2014  . Pneumococcal Conjugate-13 09/10/2013  . Pneumococcal Polysaccharide-23 03/10/2015  . Zoster 04/21/2014   Screening Tests Health Maintenance  Topic Date Due  . OPHTHALMOLOGY EXAM  08/09/2017 (Originally 12/11/2014)  . COLON CANCER SCREENING ANNUAL FOBT  08/09/2017 (Originally 08/25/2012)  . DTaP/Tdap/Td (1 - Tdap) 08/10/2026 (Originally 08/07/1960)  . TETANUS/TDAP  08/10/2026 (Originally 08/07/1960)  . INFLUENZA VACCINE  11/22/2016  . FOOT EXAM  12/30/2016  . HEMOGLOBIN A1C  02/08/2017  . URINE MICROALBUMIN  08/09/2017  . DEXA SCAN  Completed  . PNA vac Low Risk Adult  Completed      Plan:     I have personally reviewed and addressed the Medicare Annual Wellness questionnaire and have noted the following in the patient's chart:  A. Medical and social history B. Use of alcohol, tobacco or illicit drugs  C. Current medications and supplements D. Functional ability and status E.  Nutritional status F.  Physical activity G. Advance directives H. List of other physicians I.  Hospitalizations, surgeries, and ER visits in previous 12 months J.  [place] to include hearing, vision, cognitive, depression L. Referrals and appointments - none  In addition, I have reviewed and discussed with patient certain preventive protocols, quality metrics, and best practice recommendations. A written personalized care plan for preventive services as well as general preventive health recommendations were provided to patient.  See  attached scanned questionnaire for additional information.   Signed,   Lindell Noe, MHA, BS, LPN Health Coach

## 2016-08-09 NOTE — Progress Notes (Signed)
Pre visit review using our clinic review tool, if applicable. No additional management support is needed unless otherwise documented below in the visit note. 

## 2016-08-09 NOTE — Progress Notes (Signed)
PCP notes:   Health maintenance:  Eye exam - pt declined Colon cancer screening - pt declined Tetanus - pt declined A1C - completed Microalbumin - completed  Abnormal screenings:   Hearing - failed  Patient concerns:   None  Nurse concerns:  None  Next PCP appt:   08/16/16 @ 0900

## 2016-08-09 NOTE — Patient Instructions (Signed)
Kristen [name] , Thank you for taking time to come for your Medicare Wellness Visit. I appreciate your ongoing commitment to your health goals. Please review the following plan we discussed and let me know if I can assist you in the future.   These are the goals we discussed: Goals    . chronic health management          Starting 08/09/16, I will continue to take medications as prescribed to manage health conditions.       This is a list of the screening recommended for you and due dates:  Health Maintenance  Topic Date Due  . Eye exam for diabetics  08/09/2017*  . Stool Blood Test  08/09/2017*  . DTaP/Tdap/Td vaccine (1 - Tdap) 08/10/2026*  . Tetanus Vaccine  08/10/2026*  . Flu Shot  11/22/2016  . Complete foot exam   12/30/2016  . Hemoglobin A1C  02/08/2017  . Urine Protein Check  08/09/2017  . DEXA scan (bone density measurement)  Completed  . Pneumonia vaccines  Completed  *Topic was postponed. The date shown is not the original due date.   Preventive Care for Adults  A healthy lifestyle and preventive care can promote health and wellness. Preventive health guidelines for adults include the following key practices.  . A routine yearly physical is a good way to check with your health care provider about your health and preventive screening. It is a chance to share any concerns and updates on your health and to receive a thorough exam.  . Visit your dentist for a routine exam and preventive care every 6 months. Brush your teeth twice a day and floss once a day. Good oral hygiene prevents tooth decay and gum disease.  . The frequency of eye exams is based on your age, health, family medical history, use  of contact lenses, and other factors. Follow your health care provider's ecommendations for frequency of eye exams.  . Eat a healthy diet. Foods like vegetables, fruits, whole grains, low-fat dairy products, and lean protein foods contain the nutrients you need without too many  calories. Decrease your intake of foods high in solid fats, added sugars, and salt. Eat the right amount of calories for you. Get information about a proper diet from your health care provider, if necessary.  . Regular physical exercise is one of the most important things you can do for your health. Most adults should get at least 150 minutes of moderate-intensity exercise (any activity that increases your heart rate and causes you to sweat) each week. In addition, most adults need muscle-strengthening exercises on 2 or more days a week.  Silver Sneakers may be a benefit available to you. To determine eligibility, you may visit the website: www.silversneakers.com or contact program at (928)254-6901 Mon-Fri between 8AM-8PM.   . Maintain a healthy weight. The body mass index (BMI) is a screening tool to identify possible weight problems. It provides an estimate of body fat based on height and weight. Your health care provider can find your BMI and can help you achieve or maintain a healthy weight.   For adults 20 years and older: ? A BMI below 18.5 is considered underweight. ? A BMI of 18.5 to 24.9 is normal. ? A BMI of 25 to 29.9 is considered overweight. ? A BMI of 30 and above is considered obese.   . Maintain normal blood lipids and cholesterol levels by exercising and minimizing your intake of saturated fat. Eat a balanced diet with plenty  of fruit and vegetables. Blood tests for lipids and cholesterol should begin at age 90 and be repeated every 5 years. If your lipid or cholesterol levels are high, you are over 50, or you are at high risk for heart disease, you may need your cholesterol levels checked more frequently. Ongoing high lipid and cholesterol levels should be treated with medicines if diet and exercise are not working.  . If you smoke, find out from your health care provider how to quit. If you do not use tobacco, please do not start.  . If you choose to drink alcohol, please do  not consume more than 2 drinks per day. One drink is considered to be 12 ounces (355 mL) of beer, 5 ounces (148 mL) of wine, or 1.5 ounces (44 mL) of liquor.  . If you are 9-35 years old, ask your health care provider if you should take aspirin to prevent strokes.  . Use sunscreen. Apply sunscreen liberally and repeatedly throughout the day. You should seek shade when your shadow is shorter than you. Protect yourself by wearing long sleeves, pants, a wide-brimmed hat, and sunglasses year round, whenever you are outdoors.  . Once a month, do a whole body skin exam, using a mirror to look at the skin on your back. Tell your health care provider of new moles, moles that have irregular borders, moles that are larger than a pencil eraser, or moles that have changed in shape or color.

## 2016-08-13 NOTE — Progress Notes (Signed)
I reviewed health advisor's note, was available for consultation, and agree with documentation and plan.  

## 2016-08-16 ENCOUNTER — Encounter: Payer: Self-pay | Admitting: Family Medicine

## 2016-08-16 ENCOUNTER — Ambulatory Visit (INDEPENDENT_AMBULATORY_CARE_PROVIDER_SITE_OTHER): Payer: Medicare Other | Admitting: Family Medicine

## 2016-08-16 VITALS — BP 160/88 | HR 80 | Temp 98.1°F | Ht 61.0 in | Wt 205.5 lb

## 2016-08-16 DIAGNOSIS — I2510 Atherosclerotic heart disease of native coronary artery without angina pectoris: Secondary | ICD-10-CM | POA: Diagnosis not present

## 2016-08-16 DIAGNOSIS — E559 Vitamin D deficiency, unspecified: Secondary | ICD-10-CM

## 2016-08-16 DIAGNOSIS — E118 Type 2 diabetes mellitus with unspecified complications: Secondary | ICD-10-CM

## 2016-08-16 DIAGNOSIS — Z7189 Other specified counseling: Secondary | ICD-10-CM | POA: Diagnosis not present

## 2016-08-16 DIAGNOSIS — E1165 Type 2 diabetes mellitus with hyperglycemia: Secondary | ICD-10-CM

## 2016-08-16 DIAGNOSIS — M109 Gout, unspecified: Secondary | ICD-10-CM

## 2016-08-16 DIAGNOSIS — Z8673 Personal history of transient ischemic attack (TIA), and cerebral infarction without residual deficits: Secondary | ICD-10-CM | POA: Diagnosis not present

## 2016-08-16 DIAGNOSIS — L304 Erythema intertrigo: Secondary | ICD-10-CM | POA: Insufficient documentation

## 2016-08-16 DIAGNOSIS — E785 Hyperlipidemia, unspecified: Secondary | ICD-10-CM

## 2016-08-16 DIAGNOSIS — E669 Obesity, unspecified: Secondary | ICD-10-CM | POA: Diagnosis not present

## 2016-08-16 DIAGNOSIS — I700 Atherosclerosis of aorta: Secondary | ICD-10-CM

## 2016-08-16 DIAGNOSIS — I10 Essential (primary) hypertension: Secondary | ICD-10-CM

## 2016-08-16 DIAGNOSIS — Z0000 Encounter for general adult medical examination without abnormal findings: Secondary | ICD-10-CM | POA: Diagnosis not present

## 2016-08-16 DIAGNOSIS — IMO0002 Reserved for concepts with insufficient information to code with codable children: Secondary | ICD-10-CM

## 2016-08-16 DIAGNOSIS — IMO0001 Reserved for inherently not codable concepts without codable children: Secondary | ICD-10-CM

## 2016-08-16 MED ORDER — GLIMEPIRIDE 4 MG PO TABS: 4.0000 mg | ORAL_TABLET | Freq: Every day | ORAL | 3 refills | Status: DC

## 2016-08-16 MED ORDER — LOSARTAN POTASSIUM-HCTZ 100-25 MG PO TABS: 1.0000 | ORAL_TABLET | Freq: Every day | ORAL | 3 refills | Status: DC

## 2016-08-16 MED ORDER — SIMVASTATIN 20 MG PO TABS: 20.0000 mg | ORAL_TABLET | Freq: Every day | ORAL | 3 refills | Status: DC

## 2016-08-16 MED ORDER — POTASSIUM CHLORIDE ER 10 MEQ PO TBCR: 10.0000 meq | EXTENDED_RELEASE_TABLET | ORAL | 6 refills | Status: DC

## 2016-08-16 MED ORDER — VITAMIN D3 1.25 MG (50000 UT) PO TABS: 1.0000 | ORAL_TABLET | ORAL | 1 refills | Status: DC

## 2016-08-16 MED ORDER — PIOGLITAZONE HCL 30 MG PO TABS: 30.0000 mg | ORAL_TABLET | Freq: Every day | ORAL | 6 refills | Status: DC

## 2016-08-16 MED ORDER — POTASSIUM CHLORIDE ER 10 MEQ PO TBCR
10.0000 meq | EXTENDED_RELEASE_TABLET | Freq: Every day | ORAL | 3 refills | Status: DC
Start: 2016-08-16 — End: 2017-08-31

## 2016-08-16 MED ORDER — CLOTRIMAZOLE 1 % EX CREA: 1.0000 "application " | TOPICAL_CREAM | Freq: Two times a day (BID) | CUTANEOUS | 1 refills | Status: DC

## 2016-08-16 MED ORDER — LOSARTAN POTASSIUM-HCTZ 100-12.5 MG PO TABS: 1.0000 | ORAL_TABLET | Freq: Every day | ORAL | 3 refills | Status: DC

## 2016-08-16 NOTE — Assessment & Plan Note (Signed)
Will recommend she start aspirin daily.

## 2016-08-16 NOTE — Progress Notes (Signed)
BP (!) 160/88 (BP Location: Right Arm, Cuff Size: Large)   Pulse 80   Temp 98.1 F (36.7 C) (Oral)   Ht _0  (1.549 m)   Wt 205 lb 8 oz (93.2 kg)   SpO2 97%   BMI 38.83 kg/m    CC: CPE Subjective:    Patient ID: Kristen [name], female    DOB: 1941-04-21, 76 y.o.   MRN: [id number]  HPI: Kristen [name] is a 76 y.o. female presenting on 08/16/2016 for Annual Exam   Saw Katha Cabal last week for medicare wellness visit. Note reviewed.    Noticing itchy rash under breast for last few weeks. Using OTC powder for this.  Doesn't check sugars at home.   Preventative: S/p colonoscopy "a while ago" with Dr. Brunetta Genera. No records received. Denies blood in stool. No fmhx colon cancer. Did not turn in last stool test - provided with another kit today  Well woman - s/p hysterectomy for fibroids, ovaries remain. Discussed GYN cancers. Denies symptoms.  Mammogram - Birads2 03/2015. Rpt scheduled Friday.  DEXA WNL 03/2015 Flu shot - declines prevnar 08/2013, pneumovax 2016 Zostavax - 03/2014 Advanced directive: does not have set up yet. Doesn't have HCPOA in mind - probably sons. Doesn't want prolonged life support. Has packet at home.  Seat belt use discussed Sunscreen use discussed.no changing moles on skin.  Non smoker Alcohol - rare  Lives alone  4 grown children  Education: 11th grade Occupation: retired, was Scientist, water quality  Activity: no regular walking.  Diet: good water, fruits/vegetables seldom.   Relevant past medical, surgical, family and social history reviewed and updated as indicated. Interim medical history since our last visit reviewed. Allergies and medications reviewed and updated. Outpatient Medications Prior to Visit  Medication Sig Dispense Refill  . Blood Glucose Monitoring Suppl (ONE TOUCH ULTRA SYSTEM KIT) w/Device KIT 1 kit by Does not apply route once. 1 each 0  . glucose blood test strip Ck blood sugar twice a day and as directed. 100 each 3  .  nitroGLYCERIN (NITROSTAT) 0.4 MG SL tablet Place 0.4 mg under the tongue every 5 (five) minutes as needed for chest pain.    [name] DELICA LANCETS 40H MISC Check blood sugar twice a day and as directed. 100 each 3  . glimepiride (AMARYL) 4 MG tablet TAKE 1 TABLET (4 MG TOTAL) BY MOUTH DAILY WITH BREAKFAST. 90 tablet 2  . losartan-hydrochlorothiazide (HYZAAR) 100-12.5 MG tablet Take 1 tablet by mouth daily.    . simvastatin (ZOCOR) 20 MG tablet TAKE 1 TABLET (20 MG TOTAL) BY MOUTH DAILY. 90 tablet 2   No facility-administered medications prior to visit.      Per HPI unless specifically indicated in ROS section below Review of Systems  Constitutional: Negative for activity change, appetite change, chills, fatigue, fever and unexpected weight change.  HENT: Negative for hearing loss.   Eyes: Negative for visual disturbance.  Respiratory: Negative for cough, chest tightness, shortness of breath and wheezing.   Cardiovascular: Negative for chest pain, palpitations and leg swelling.  Gastrointestinal: Negative for abdominal distention, abdominal pain, blood in stool, constipation, diarrhea, nausea and vomiting.  Genitourinary: Negative for difficulty urinating and hematuria.  Musculoskeletal: Negative for arthralgias, myalgias and neck pain.  Skin: Negative for rash.  Neurological: Negative for dizziness, seizures, syncope and headaches.  Hematological: Negative for adenopathy. Does not bruise/bleed easily.  Psychiatric/Behavioral: Negative for dysphoric mood. The patient is not nervous/anxious.  Objective:    BP (!) 160/88 (BP Location: Right Arm, Cuff Size: Large)   Pulse 80   Temp 98.1 F (36.7 C) (Oral)   Ht _0  (1.549 m)   Wt 205 lb 8 oz (93.2 kg)   SpO2 97%   BMI 38.83 kg/m   Wt Readings from Last 3 Encounters:  08/16/16 205 lb 8 oz (93.2 kg)  08/09/16 205 lb 8 oz (93.2 kg)  03/13/16 203 lb 12 oz (92.4 kg)    Physical Exam  Constitutional: She is oriented to  person, place, and time. She appears well-developed and well-nourished. No distress.  HENT:  Head: Normocephalic and atraumatic.  Right Ear: Hearing, tympanic membrane, external ear and ear canal normal.  Left Ear: Hearing, tympanic membrane, external ear and ear canal normal.  Nose: Nose normal.  Mouth/Throat: Uvula is midline, oropharynx is clear and moist and mucous membranes are normal. No oropharyngeal exudate, posterior oropharyngeal edema or posterior oropharyngeal erythema.  Eyes: Conjunctivae and EOM are normal. Pupils are equal, round, and reactive to light. No scleral icterus.  Neck: Normal range of motion. Neck supple. Carotid bruit is not present. No thyromegaly present.  Cardiovascular: Normal rate, regular rhythm, normal heart sounds and intact distal pulses.   No murmur heard. Pulses:      Radial pulses are 2+ on the right side, and 2+ on the left side.  Pulmonary/Chest: Effort normal and breath sounds normal. No respiratory distress. She has no wheezes. She has no rales.  Abdominal: Soft. Bowel sounds are normal. She exhibits no distension and no mass. There is no tenderness. There is no rebound and no guarding.  Musculoskeletal: Normal range of motion. She exhibits no edema.  Lymphadenopathy:    She has no cervical adenopathy.  Neurological: She is alert and oriented to person, place, and time.  CN grossly intact, station and gait intact  Skin: Skin is warm and dry. Rash noted.  Hyperpigmented rash R breast with skin breakdown at breast crease  Psychiatric: She has a normal mood and affect. Her behavior is normal. Judgment and thought content normal.  Nursing note and vitals reviewed.  Results for orders placed or performed in visit on 08/09/16  Lipid panel  Result Value Ref Range   Cholesterol 156 0 - 200 mg/dL   Triglycerides 80.0 0.0 - 149.0 mg/dL   HDL 46.70 >39.00 mg/dL   VLDL 16.0 0.0 - 40.0 mg/dL   LDL Cholesterol 93 0 - 99 mg/dL   Total CHOL/HDL Ratio 3     NonHDL 109.39   Comprehensive metabolic panel  Result Value Ref Range   Sodium 138 135 - 145 mEq/L   Potassium 3.3 (L) 3.5 - 5.1 mEq/L   Chloride 99 96 - 112 mEq/L   CO2 32 19 - 32 mEq/L   Glucose, Bld 178 (H) 70 - 99 mg/dL   BUN 16 6 - 23 mg/dL   Creatinine, Ser 0.91 0.40 - 1.20 mg/dL   Total Bilirubin 0.7 0.2 - 1.2 mg/dL   Alkaline Phosphatase 97 39 - 117 U/L   AST 15 0 - 37 U/L   ALT 13 0 - 35 U/L   Total Protein 7.5 6.0 - 8.3 g/dL   Albumin 3.7 3.5 - 5.2 g/dL   Calcium 9.8 8.4 - 10.5 mg/dL   GFR 77.50 >60.00 mL/min  Hemoglobin A1c  Result Value Ref Range   Hgb A1c MFr Bld 10.1 (H) 4.6 - 6.5 %  Uric acid  Result Value Ref Range  Uric Acid, Serum 5.5 2.4 - 7.0 mg/dL  VITAMIN D 25 Hydroxy (Vit-D Deficiency, Fractures)  Result Value Ref Range   VITD 21.54 (L) 30.00 - 100.00 ng/mL  Microalbumin / creatinine urine ratio  Result Value Ref Range   Microalb, Ur 7.8 (H) 0.0 - 1.9 mg/dL   Creatinine,U 176.1 mg/dL   Microalb Creat Ratio 4.4 0.0 - 30.0 mg/g      Assessment & Plan:   Problem List Items Addressed This Visit    Abdominal aortic atherosclerosis (HCC)    Continue statin.       Relevant Medications   simvastatin (ZOCOR) 20 MG tablet   losartan-hydrochlorothiazide (HYZAAR) 100-25 MG tablet   Advanced care planning/counseling discussion    Advanced directive: does not have set up yet. Doesn't have HCPOA in mind - probably sons. Doesn't want prolonged life support. Has packet at home.       CAD (coronary artery disease)/coronary calcifications    Continue statin.       Relevant Medications   simvastatin (ZOCOR) 20 MG tablet   losartan-hydrochlorothiazide (HYZAAR) 100-25 MG tablet   Diabetes type 2, uncontrolled (HCC)    Chronic, deteriorated. Reviewed with patient. Intolerance to metformin. Will add actos 66m daily.  Will need urinalysis next visit.       Relevant Medications   glimepiride (AMARYL) 4 MG tablet   simvastatin (ZOCOR) 20 MG tablet    pioglitazone (ACTOS) 30 MG tablet   losartan-hydrochlorothiazide (HYZAAR) 100-25 MG tablet   Gout    Urate level normal.       Health maintenance examination - Primary    Preventative protocols reviewed and updated unless pt declined. Discussed healthy diet and lifestyle.       History of CVA (cerebrovascular accident)    Will recommend she start aspirin daily.       HLD (hyperlipidemia)    Chronic, stable - continue zocor.       Relevant Medications   simvastatin (ZOCOR) 20 MG tablet   losartan-hydrochlorothiazide (HYZAAR) 100-25 MG tablet   Hypertension, uncontrolled    Chronic, deteriorated on recheck today. Will increase hyzaar to 100/262m For hypokalemia - start Kdur 1045mdaily.       Relevant Medications   simvastatin (ZOCOR) 20 MG tablet   losartan-hydrochlorothiazide (HYZAAR) 100-25 MG tablet   Intertrigo    Treat with lotrimin.       Obesity, Class II, BMI 35-39.9, with comorbidity (HCC)    Discussed healthy diet and lifestyle changes to affect sustainable weight loss.       Relevant Medications   glimepiride (AMARYL) 4 MG tablet   pioglitazone (ACTOS) 30 MG tablet   Thoracic aortic atherosclerosis (HCC)    Continue statin. Consider aspirin       Relevant Medications   simvastatin (ZOCOR) 20 MG tablet   losartan-hydrochlorothiazide (HYZAAR) 100-25 MG tablet   Vitamin D deficiency    Not taking replacement. Will Rx vit D 50,000 IU weekly x 3 months then rec start OTC daily.           Follow up plan: Return in about 3 months (around 11/15/2016) for follow up visit.  JavRia BushD

## 2016-08-16 NOTE — Assessment & Plan Note (Signed)
Discussed healthy diet and lifestyle changes to affect sustainable weight loss  

## 2016-08-16 NOTE — Assessment & Plan Note (Signed)
Urate level normal.

## 2016-08-16 NOTE — Assessment & Plan Note (Addendum)
Chronic, deteriorated on recheck today. Will increase hyzaar to 100/25mg . For hypokalemia - start Kdur 19mEq daily.

## 2016-08-16 NOTE — Assessment & Plan Note (Signed)
Not taking replacement. Will Rx vit D 50,000 IU weekly x 3 months then rec start OTC daily.

## 2016-08-16 NOTE — Assessment & Plan Note (Signed)
Continue statin. 

## 2016-08-16 NOTE — Assessment & Plan Note (Signed)
Treat with lotrimin.

## 2016-08-16 NOTE — Patient Instructions (Addendum)
Pass by lab to pick up stool kit.  I recommend increase walking in routine.  Start weekly vitamin D for 3 months then start 1000 units over the counter daily.  Start potassium 1 tablet daily Sugar was too high - continue glimepiride, add actos '30mg'$  daily sent to pharmacy.  Use lotrimin to rash under right breast - sent to pharmacy.  Return in 3 months for diabetes follow up visit.   Health Maintenance, Female Adopting a healthy lifestyle and getting preventive care can go a long way to promote health and wellness. Talk with your health care provider about what schedule of regular examinations is right for you. This is a good chance for you to check in with your provider about disease prevention and staying healthy. In between checkups, there are plenty of things you can do on your own. Experts have done a lot of research about which lifestyle changes and preventive measures are most likely to keep you healthy. Ask your health care provider for more information. Weight and diet Eat a healthy diet  Be sure to include plenty of vegetables, fruits, low-fat dairy products, and lean protein.  Do not eat a lot of foods high in solid fats, added sugars, or salt.  Get regular exercise. This is one of the most important things you can do for your health.  Most adults should exercise for at least 150 minutes each week. The exercise should increase your heart rate and make you sweat (moderate-intensity exercise).  Most adults should also do strengthening exercises at least twice a week. This is in addition to the moderate-intensity exercise. Maintain a healthy weight  Body mass index (BMI) is a measurement that can be used to identify possible weight problems. It estimates body fat based on height and weight. Your health care provider can help determine your BMI and help you achieve or maintain a healthy weight.  For females 59 years of age and older:  A BMI below 18.5 is considered  underweight.  A BMI of 18.5 to 24.9 is normal.  A BMI of 25 to 29.9 is considered overweight.  A BMI of 30 and above is considered obese. Watch levels of cholesterol and blood lipids  You should start having your blood tested for lipids and cholesterol at 75 years of age, then have this test every 5 years.  You may need to have your cholesterol levels checked more often if:  Your lipid or cholesterol levels are high.  You are older than 75 years of age.  You are at high risk for heart disease. Cancer screening Lung Cancer  Lung cancer screening is recommended for adults 30-65 years old who are at high risk for lung cancer because of a history of smoking.  A yearly low-dose CT scan of the lungs is recommended for people who:  Currently smoke.  Have quit within the past 15 years.  Have at least a 30-pack-year history of smoking. A pack year is smoking an average of one pack of cigarettes a day for 1 year.  Yearly screening should continue until it has been 15 years since you quit.  Yearly screening should stop if you develop a health problem that would prevent you from having lung cancer treatment. Breast Cancer  Practice breast self-awareness. This means understanding how your breasts normally appear and feel.  It also means doing regular breast self-exams. Let your health care provider know about any changes, no matter how small.  If you are in your 39s or  22s, you should have a clinical breast exam (CBE) by a health care provider every 1-3 years as part of a regular health exam.  If you are 46 or older, have a CBE every year. Also consider having a breast X-ray (mammogram) every year.  If you have a family history of breast cancer, talk to your health care provider about genetic screening.  If you are at high risk for breast cancer, talk to your health care provider about having an MRI and a mammogram every year.  Breast cancer gene (BRCA) assessment is recommended  for women who have family members with BRCA-related cancers. BRCA-related cancers include:  Breast.  Ovarian.  Tubal.  Peritoneal cancers.  Results of the assessment will determine the need for genetic counseling and BRCA1 and BRCA2 testing. Cervical Cancer  Your health care provider may recommend that you be screened regularly for cancer of the pelvic organs (ovaries, uterus, and vagina). This screening involves a pelvic examination, including checking for microscopic changes to the surface of your cervix (Pap test). You may be encouraged to have this screening done every 3 years, beginning at age 66.  For women ages 62-65, health care providers may recommend pelvic exams and Pap testing every 3 years, or they may recommend the Pap and pelvic exam, combined with testing for human papilloma virus (HPV), every 5 years. Some types of HPV increase your risk of cervical cancer. Testing for HPV may also be done on women of any age with unclear Pap test results.  Other health care providers may not recommend any screening for nonpregnant women who are considered low risk for pelvic cancer and who do not have symptoms. Ask your health care provider if a screening pelvic exam is right for you.  If you have had past treatment for cervical cancer or a condition that could lead to cancer, you need Pap tests and screening for cancer for at least 20 years after your treatment. If Pap tests have been discontinued, your risk factors (such as having a new sexual partner) need to be reassessed to determine if screening should resume. Some women have medical problems that increase the chance of getting cervical cancer. In these cases, your health care provider may recommend more frequent screening and Pap tests. Colorectal Cancer  This type of cancer can be detected and often prevented.  Routine colorectal cancer screening usually begins at 75 years of age and continues through 75 years of age.  Your health  care provider may recommend screening at an earlier age if you have risk factors for colon cancer.  Your health care provider may also recommend using home test kits to check for hidden blood in the stool.  A small camera at the end of a tube can be used to examine your colon directly (sigmoidoscopy or colonoscopy). This is done to check for the earliest forms of colorectal cancer.  Routine screening usually begins at age 75.  Direct examination of the colon should be repeated every 5-10 years through 75 years of age. However, you may need to be screened more often if early forms of precancerous polyps or small growths are found. Skin Cancer  Check your skin from head to toe regularly.  Tell your health care provider about any new moles or changes in moles, especially if there is a change in a mole's shape or color.  Also tell your health care provider if you have a mole that is larger than the size of a pencil eraser.  Always use sunscreen. Apply sunscreen liberally and repeatedly throughout the day.  Protect yourself by wearing long sleeves, pants, a wide-brimmed hat, and sunglasses whenever you are outside. Heart disease, diabetes, and high blood pressure  High blood pressure causes heart disease and increases the risk of stroke. High blood pressure is more likely to develop in:  People who have blood pressure in the high end of the normal range (130-139/85-89 mm Hg).  People who are overweight or obese.  People who are African American.  If you are 11-48 years of age, have your blood pressure checked every 3-5 years. If you are 35 years of age or older, have your blood pressure checked every year. You should have your blood pressure measured twice-once when you are at a hospital or clinic, and once when you are not at a hospital or clinic. Record the average of the two measurements. To check your blood pressure when you are not at a hospital or clinic, you can use:  An automated  blood pressure machine at a pharmacy.  A home blood pressure monitor.  If you are between 18 years and 1 years old, ask your health care provider if you should take aspirin to prevent strokes.  Have regular diabetes screenings. This involves taking a blood sample to check your fasting blood sugar level.  If you are at a normal weight and have a low risk for diabetes, have this test once every three years after 75 years of age.  If you are overweight and have a high risk for diabetes, consider being tested at a younger age or more often. Preventing infection Hepatitis B  If you have a higher risk for hepatitis B, you should be screened for this virus. You are considered at high risk for hepatitis B if:  You were born in a country where hepatitis B is common. Ask your health care provider which countries are considered high risk.  Your parents were born in a high-risk country, and you have not been immunized against hepatitis B (hepatitis B vaccine).  You have HIV or AIDS.  You use needles to inject street drugs.  You live with someone who has hepatitis B.  You have had sex with someone who has hepatitis B.  You get hemodialysis treatment.  You take certain medicines for conditions, including cancer, organ transplantation, and autoimmune conditions. Hepatitis C  Blood testing is recommended for:  Everyone born from [birth year] through 1964.  Anyone with known risk factors for hepatitis C. Sexually transmitted infections (STIs)  You should be screened for sexually transmitted infections (STIs) including gonorrhea and chlamydia if:  You are sexually active and are younger than 75 years of age.  You are older than 75 years of age and your health care provider tells you that you are at risk for this type of infection.  Your sexual activity has changed since you were last screened and you are at an increased risk for chlamydia or gonorrhea. Ask your health care provider if you are at  risk.  If you do not have HIV, but are at risk, it may be recommended that you take a prescription medicine daily to prevent HIV infection. This is called pre-exposure prophylaxis (PrEP). You are considered at risk if:  You are sexually active and do not regularly use condoms or know the HIV status of your partner(s).  You take drugs by injection.  You are sexually active with a partner who has HIV. Talk with your health care provider  about whether you are at high risk of being infected with HIV. If you choose to begin PrEP, you should first be tested for HIV. You should then be tested every 3 months for as long as you are taking PrEP. Pregnancy  If you are premenopausal and you may become pregnant, ask your health care provider about preconception counseling.  If you may become pregnant, take 400 to 800 micrograms (mcg) of folic acid every day.  If you want to prevent pregnancy, talk to your health care provider about birth control (contraception). Osteoporosis and menopause  Osteoporosis is a disease in which the bones lose minerals and strength with aging. This can result in serious bone fractures. Your risk for osteoporosis can be identified using a bone density scan.  If you are 48 years of age or older, or if you are at risk for osteoporosis and fractures, ask your health care provider if you should be screened.  Ask your health care provider whether you should take a calcium or vitamin D supplement to lower your risk for osteoporosis.  Menopause may have certain physical symptoms and risks.  Hormone replacement therapy may reduce some of these symptoms and risks. Talk to your health care provider about whether hormone replacement therapy is right for you. Follow these instructions at home:  Schedule regular health, dental, and eye exams.  Stay current with your immunizations.  Do not use any tobacco products including cigarettes, chewing tobacco, or electronic  cigarettes.  If you are pregnant, do not drink alcohol.  If you are breastfeeding, limit how much and how often you drink alcohol.  Limit alcohol intake to no more than 1 drink per day for nonpregnant women. One drink equals 12 ounces of beer, 5 ounces of wine, or 1 ounces of hard liquor.  Do not use street drugs.  Do not share needles.  Ask your health care provider for help if you need support or information about quitting drugs.  Tell your health care provider if you often feel depressed.  Tell your health care provider if you have ever been abused or do not feel safe at home. This information is not intended to replace advice given to you by your health care provider. Make sure you discuss any questions you have with your health care provider. Document Released: 10/24/2010 Document Revised: 09/16/2015 Document Reviewed: 01/12/2015 Elsevier Interactive Patient Education  2017 [employer].

## 2016-08-16 NOTE — Assessment & Plan Note (Signed)
Continue statin. Consider aspirin 

## 2016-08-16 NOTE — Assessment & Plan Note (Signed)
Preventative protocols reviewed and updated unless pt declined. Discussed healthy diet and lifestyle.  

## 2016-08-16 NOTE — Assessment & Plan Note (Signed)
Chronic, deteriorated. Reviewed with patient. Intolerance to metformin. Will add actos 30mg  daily.  Will need urinalysis next visit.

## 2016-08-16 NOTE — Progress Notes (Signed)
Pre visit review using our clinic review tool, if applicable. No additional management support is needed unless otherwise documented below in the visit note. 

## 2016-08-16 NOTE — Assessment & Plan Note (Signed)
Chronic, stable - continue zocor.

## 2016-08-16 NOTE — Assessment & Plan Note (Signed)
Advanced directive: does not have set up yet. Doesn't have HCPOA in mind - probably sons. Doesn't want prolonged life support. Has packet at home.

## 2016-08-18 ENCOUNTER — Encounter: Payer: Self-pay | Admitting: *Deleted

## 2016-08-18 ENCOUNTER — Ambulatory Visit
Admission: RE | Admit: 2016-08-18 | Discharge: 2016-08-18 | Disposition: A | Discharge status: Home / Self Care | Payer: Medicare Other | Source: Ambulatory Visit | Attending: Family Medicine | Admitting: Family Medicine

## 2016-08-18 DIAGNOSIS — Z1231 Encounter for screening mammogram for malignant neoplasm of breast: Secondary | ICD-10-CM | POA: Diagnosis not present

## 2016-08-18 LAB — HM MAMMOGRAPHY

## 2016-09-02 ENCOUNTER — Observation Stay (HOSPITAL_COMMUNITY): Payer: Medicare Other

## 2016-09-02 ENCOUNTER — Encounter (HOSPITAL_COMMUNITY): Payer: Self-pay | Admitting: Emergency Medicine

## 2016-09-02 ENCOUNTER — Emergency Department (HOSPITAL_COMMUNITY): Payer: Medicare Other

## 2016-09-02 ENCOUNTER — Inpatient Hospital Stay (HOSPITAL_COMMUNITY)
Admission: EM | Admit: 2016-09-02 | Discharge: 2016-09-05 | DRG: 065 | Disposition: A | Discharge status: Home Health | Payer: Medicare Other | Attending: Internal Medicine | Admitting: Internal Medicine

## 2016-09-02 DIAGNOSIS — I638 Other cerebral infarction: Secondary | ICD-10-CM | POA: Diagnosis not present

## 2016-09-02 DIAGNOSIS — I2510 Atherosclerotic heart disease of native coronary artery without angina pectoris: Secondary | ICD-10-CM | POA: Diagnosis not present

## 2016-09-02 DIAGNOSIS — K219 Gastro-esophageal reflux disease without esophagitis: Secondary | ICD-10-CM | POA: Diagnosis present

## 2016-09-02 DIAGNOSIS — E1151 Type 2 diabetes mellitus with diabetic peripheral angiopathy without gangrene: Secondary | ICD-10-CM | POA: Diagnosis not present

## 2016-09-02 DIAGNOSIS — N179 Acute kidney failure, unspecified: Secondary | ICD-10-CM | POA: Diagnosis present

## 2016-09-02 DIAGNOSIS — E1165 Type 2 diabetes mellitus with hyperglycemia: Secondary | ICD-10-CM | POA: Diagnosis not present

## 2016-09-02 DIAGNOSIS — R29898 Other symptoms and signs involving the musculoskeletal system: Secondary | ICD-10-CM

## 2016-09-02 DIAGNOSIS — Z7982 Long term (current) use of aspirin: Secondary | ICD-10-CM

## 2016-09-02 DIAGNOSIS — Z6841 Body Mass Index (BMI) 40.0 and over, adult: Secondary | ICD-10-CM

## 2016-09-02 DIAGNOSIS — Z8249 Family history of ischemic heart disease and other diseases of the circulatory system: Secondary | ICD-10-CM | POA: Diagnosis not present

## 2016-09-02 DIAGNOSIS — I10 Essential (primary) hypertension: Secondary | ICD-10-CM | POA: Diagnosis not present

## 2016-09-02 DIAGNOSIS — M79671 Pain in right foot: Secondary | ICD-10-CM

## 2016-09-02 DIAGNOSIS — E1169 Type 2 diabetes mellitus with other specified complication: Secondary | ICD-10-CM | POA: Diagnosis present

## 2016-09-02 DIAGNOSIS — H8110 Benign paroxysmal vertigo, unspecified ear: Secondary | ICD-10-CM | POA: Diagnosis present

## 2016-09-02 DIAGNOSIS — I6389 Other cerebral infarction: Secondary | ICD-10-CM

## 2016-09-02 DIAGNOSIS — M10071 Idiopathic gout, right ankle and foot: Secondary | ICD-10-CM | POA: Diagnosis not present

## 2016-09-02 DIAGNOSIS — E785 Hyperlipidemia, unspecified: Secondary | ICD-10-CM | POA: Diagnosis present

## 2016-09-02 DIAGNOSIS — Z86718 Personal history of other venous thrombosis and embolism: Secondary | ICD-10-CM

## 2016-09-02 DIAGNOSIS — I252 Old myocardial infarction: Secondary | ICD-10-CM

## 2016-09-02 DIAGNOSIS — Z86711 Personal history of pulmonary embolism: Secondary | ICD-10-CM

## 2016-09-02 DIAGNOSIS — E118 Type 2 diabetes mellitus with unspecified complications: Secondary | ICD-10-CM

## 2016-09-02 DIAGNOSIS — I69351 Hemiplegia and hemiparesis following cerebral infarction affecting right dominant side: Secondary | ICD-10-CM | POA: Insufficient documentation

## 2016-09-02 DIAGNOSIS — D649 Anemia, unspecified: Secondary | ICD-10-CM | POA: Diagnosis present

## 2016-09-02 DIAGNOSIS — G4733 Obstructive sleep apnea (adult) (pediatric): Secondary | ICD-10-CM | POA: Diagnosis not present

## 2016-09-02 DIAGNOSIS — M6281 Muscle weakness (generalized): Secondary | ICD-10-CM | POA: Diagnosis not present

## 2016-09-02 DIAGNOSIS — D638 Anemia in other chronic diseases classified elsewhere: Secondary | ICD-10-CM | POA: Diagnosis present

## 2016-09-02 DIAGNOSIS — G8191 Hemiplegia, unspecified affecting right dominant side: Secondary | ICD-10-CM | POA: Diagnosis present

## 2016-09-02 DIAGNOSIS — I639 Cerebral infarction, unspecified: Principal | ICD-10-CM | POA: Diagnosis present

## 2016-09-02 DIAGNOSIS — E66811 Obesity, class 1: Secondary | ICD-10-CM | POA: Diagnosis present

## 2016-09-02 DIAGNOSIS — E669 Obesity, unspecified: Secondary | ICD-10-CM | POA: Diagnosis present

## 2016-09-02 DIAGNOSIS — R29818 Other symptoms and signs involving the nervous system: Secondary | ICD-10-CM | POA: Diagnosis not present

## 2016-09-02 DIAGNOSIS — IMO0002 Reserved for concepts with insufficient information to code with codable children: Secondary | ICD-10-CM

## 2016-09-02 DIAGNOSIS — Z833 Family history of diabetes mellitus: Secondary | ICD-10-CM | POA: Diagnosis not present

## 2016-09-02 DIAGNOSIS — Z96653 Presence of artificial knee joint, bilateral: Secondary | ICD-10-CM | POA: Diagnosis not present

## 2016-09-02 DIAGNOSIS — Z8673 Personal history of transient ischemic attack (TIA), and cerebral infarction without residual deficits: Secondary | ICD-10-CM

## 2016-09-02 DIAGNOSIS — I63532 Cerebral infarction due to unspecified occlusion or stenosis of left posterior cerebral artery: Secondary | ICD-10-CM | POA: Diagnosis not present

## 2016-09-02 DIAGNOSIS — Z90710 Acquired absence of both cervix and uterus: Secondary | ICD-10-CM

## 2016-09-02 LAB — I-STAT CHEM 8, ED
BUN: 22 mg/dL — ABNORMAL HIGH (ref 6–20)
CREATININE: 1.4 mg/dL — AB (ref 0.44–1.00)
Calcium, Ion: 1.13 mmol/L — ABNORMAL LOW (ref 1.15–1.40)
Chloride: 104 mmol/L (ref 101–111)
GLUCOSE: 148 mg/dL — AB (ref 65–99)
HEMATOCRIT: 38 % (ref 36.0–46.0)
Hemoglobin: 12.9 g/dL (ref 12.0–15.0)
Potassium: 3.5 mmol/L (ref 3.5–5.1)
Sodium: 141 mmol/L (ref 135–145)
TCO2: 27 mmol/L (ref 0–100)

## 2016-09-02 LAB — DIFFERENTIAL
BASOS PCT: 0 %
Basophils Absolute: 0 10*3/uL (ref 0.0–0.1)
EOS ABS: 0 10*3/uL (ref 0.0–0.7)
EOS PCT: 0 %
Lymphocytes Relative: 29 %
Lymphs Abs: 2.6 10*3/uL (ref 0.7–4.0)
MONO ABS: 0.4 10*3/uL (ref 0.1–1.0)
MONOS PCT: 5 %
NEUTROS ABS: 6 10*3/uL (ref 1.7–7.7)
Neutrophils Relative %: 66 %

## 2016-09-02 LAB — I-STAT TROPONIN, ED: TROPONIN I, POC: 0.03 ng/mL (ref 0.00–0.08)

## 2016-09-02 LAB — APTT: aPTT: 21 seconds — ABNORMAL LOW (ref 24–36)

## 2016-09-02 LAB — COMPREHENSIVE METABOLIC PANEL
ALT: 12 U/L — ABNORMAL LOW (ref 14–54)
ANION GAP: 7 (ref 5–15)
AST: 17 U/L (ref 15–41)
Albumin: 3.3 g/dL — ABNORMAL LOW (ref 3.5–5.0)
Alkaline Phosphatase: 87 U/L (ref 38–126)
BUN: 19 mg/dL (ref 6–20)
CHLORIDE: 105 mmol/L (ref 101–111)
CO2: 26 mmol/L (ref 22–32)
CREATININE: 1.31 mg/dL — AB (ref 0.44–1.00)
Calcium: 9.1 mg/dL (ref 8.9–10.3)
GFR, EST AFRICAN AMERICAN: 45 mL/min — AB (ref >60)
GFR, EST NON AFRICAN AMERICAN: 39 mL/min — AB (ref >60)
Glucose, Bld: 147 mg/dL — ABNORMAL HIGH (ref 65–99)
Potassium: 3.5 mmol/L (ref 3.5–5.1)
SODIUM: 138 mmol/L (ref 135–145)
Total Bilirubin: 0.6 mg/dL (ref 0.3–1.2)
Total Protein: 6.9 g/dL (ref 6.5–8.1)

## 2016-09-02 LAB — CBC
HEMATOCRIT: 37.1 % (ref 36.0–46.0)
Hemoglobin: 11.8 g/dL — ABNORMAL LOW (ref 12.0–15.0)
MCH: 26.7 pg (ref 26.0–34.0)
MCHC: 31.8 g/dL (ref 30.0–36.0)
MCV: 83.9 fL (ref 78.0–100.0)
PLATELETS: 228 10*3/uL (ref 150–400)
RBC: 4.42 MIL/uL (ref 3.87–5.11)
RDW: 15.1 % (ref 11.5–15.5)
WBC: 9.1 10*3/uL (ref 4.0–10.5)

## 2016-09-02 LAB — PROTIME-INR
INR: 0.95
PROTHROMBIN TIME: 12.6 s (ref 11.4–15.2)

## 2016-09-02 LAB — CBG MONITORING, ED: GLUCOSE-CAPILLARY: 120 mg/dL — AB (ref 65–99)

## 2016-09-02 MED ORDER — SODIUM CHLORIDE 0.9 % IV SOLN
INTRAVENOUS | Status: AC
  Administered 2016-09-02: 19:00:00 via INTRAVENOUS

## 2016-09-02 MED ORDER — STROKE: EARLY STAGES OF RECOVERY BOOK
Freq: Once | Status: AC
  Administered 2016-09-02: 21:00:00
  Filled 2016-09-02: qty 1

## 2016-09-02 MED ORDER — ENOXAPARIN SODIUM 40 MG/0.4ML ~~LOC~~ SOLN
40.0000 mg | SUBCUTANEOUS | Status: DC
  Filled 2016-09-02: qty 0.4

## 2016-09-02 MED ORDER — ACETAMINOPHEN 325 MG PO TABS: 650.0000 mg | ORAL_TABLET | ORAL | Status: DC | PRN

## 2016-09-02 MED ORDER — HYDRALAZINE HCL 20 MG/ML IJ SOLN
10.0000 mg | Freq: Four times a day (QID) | INTRAMUSCULAR | Status: DC | PRN
Start: 2016-09-02 — End: 2016-09-05
  Administered 2016-09-02: 10 mg via INTRAVENOUS
  Filled 2016-09-02: qty 1

## 2016-09-02 MED ORDER — POTASSIUM CHLORIDE CRYS ER 10 MEQ PO TBCR
10.0000 meq | EXTENDED_RELEASE_TABLET | Freq: Every day | ORAL | Status: DC
  Administered 2016-09-03 – 2016-09-05 (×3): 10 meq via ORAL
  Filled 2016-09-02 (×6): qty 1

## 2016-09-02 MED ORDER — LABETALOL HCL 5 MG/ML IV SOLN
10.0000 mg | Freq: Once | INTRAVENOUS | Status: AC
  Administered 2016-09-02: 10 mg via INTRAVENOUS

## 2016-09-02 MED ORDER — SENNOSIDES-DOCUSATE SODIUM 8.6-50 MG PO TABS
1.0000 | ORAL_TABLET | Freq: Every evening | ORAL | Status: DC | PRN
  Filled 2016-09-02: qty 1

## 2016-09-02 MED ORDER — ACETAMINOPHEN 160 MG/5ML PO SOLN: 650.0000 mg | ORAL | Status: DC | PRN

## 2016-09-02 MED ORDER — ALUM & MAG HYDROXIDE-SIMETH 200-200-20 MG/5ML PO SUSP
30.0000 mL | Freq: Four times a day (QID) | ORAL | Status: DC | PRN
  Administered 2016-09-02: 30 mL via ORAL
  Filled 2016-09-02: qty 30

## 2016-09-02 MED ORDER — LOSARTAN POTASSIUM-HCTZ 100-25 MG PO TABS: 1.0000 | ORAL_TABLET | Freq: Every day | ORAL | Status: DC

## 2016-09-02 MED ORDER — ACETAMINOPHEN 650 MG RE SUPP: 650.0000 mg | RECTAL | Status: DC | PRN

## 2016-09-02 MED ORDER — CLOPIDOGREL BISULFATE 75 MG PO TABS
75.0000 mg | ORAL_TABLET | Freq: Every day | ORAL | Status: DC
  Administered 2016-09-02 – 2016-09-03 (×2): 75 mg via ORAL
  Filled 2016-09-02 (×2): qty 1

## 2016-09-02 MED ORDER — SIMVASTATIN 20 MG PO TABS
20.0000 mg | ORAL_TABLET | Freq: Every day | ORAL | Status: DC
  Administered 2016-09-02: 20 mg via ORAL
  Filled 2016-09-02 (×2): qty 1

## 2016-09-02 NOTE — ED Triage Notes (Signed)
Pt. Stated, I was washing clothes around 1030 and my rt. Arm would just flop. I can lift some but will flop. Im also having some sharp pain on the right side of my neck.  Pt. alert and oriented x 4,  Unable to hold rt. Arm for 10 sec. . Code strode called

## 2016-09-02 NOTE — ED Notes (Signed)
PT at bedside.

## 2016-09-02 NOTE — Progress Notes (Signed)
Pt arrived to 5M01 via stretcher.  Pt alert and oriented, no complaints of pain.  BP elevated, other  Vitals stable. Telemetry applied and CCMD notified. Will continue to monitor.  Cori Razor, RN

## 2016-09-02 NOTE — Consult Note (Signed)
Reason for Consult: Code Stroke Referring Physician: ER  Kristen [name] is an 76 y.o. female.  HPI: LSW 10:30 am.  Acute right arm and leg weakness and numbness.  CT Brain negative.  She was taking ASA 81 mg qd at home.  She has a history of prior stroke and DM.    Past Medical History:  Diagnosis Date  . Abdominal aortic atherosclerosis ([place]) by CT 02/2014  . CAD (coronary artery disease)    by CT, per pt h/o MI  . Diabetes type 2, uncontrolled ([place])   . Frequent headaches   . GERD (gastroesophageal reflux disease)   . History of pulmonary embolism 2012  . HLD (hyperlipidemia)   . HTN (hypertension)   . 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 Morbid obesity ([place])   . Myocardial infarction [facility]) 2012   per pt. report, states she was treated with medicine, here at [facility]    . Osteoarthritis    knees  . Primary localized osteoarthritis of left knee 09/22/2014  . Sleep apnea 2011   study done in [place], states that since she lost weight she doesn't use the CPAP any longer & she doesn't have a problem with sleep apnea  . Stroke [facility])    still has balance problem on occas. , uses cane but that's mainly for the left knee pain  . Syncope 03/01/2016  . Thoracic aortic atherosclerosis ([place]) 12/2015   by CXR  . Vertigo    hx. benign postitional postural    Past Surgical History:  Procedure Laterality Date  . CATARACT EXTRACTION Bilateral 2013  . EYE SURGERY     /w IOL  . FOOT SURGERY Right   . PARTIAL HYSTERECTOMY     for fibroids, ovaries remain  . TONSILLECTOMY    . TOTAL KNEE ARTHROPLASTY Right 1990s  . TOTAL KNEE ARTHROPLASTY Left 09/22/2014   [name], MD  . TUBAL LIGATION      Family History  Problem Relation Age of Onset  . Cancer Mother        bone  . Diabetes Father   . Hypertension Father   . Cancer Son 31       lung  . Congenital heart disease Son 18    Social  History:  reports that she has never smoked. She has never used smokeless tobacco. She reports that she drinks alcohol. She reports that she does not use drugs.  Allergies:  Allergies  Allergen Reactions  . Acetaminophen Other (See Comments)    "Causes me to spit up blood"  . Aleve [Naproxen Sodium] Other (See Comments)    Spits up blood  . Doxycycline Other (See Comments)    Malaise, GI upset, "felt drunk" and very ill  . Metformin And Related Other (See Comments)    Chills, dizziness  . Penicillins Rash    Has patient had a PCN reaction causing immediate rash, facial/tongue/throat swelling, SOB or lightheadedness with hypotension: Yes Has patient had a PCN reaction causing severe rash involving mucus membranes or skin necrosis: No Has patient had a PCN reaction that required hospitalization No Has patient had a PCN reaction occurring within the last 10 years: No If all of the above answers are "NO", then may proceed with Cephalosporin use.     Prior to Admission medications   Medication Sig Start Date End Date Taking? Authorizing Provider  Blood Glucose Monitoring Suppl (  ONE TOUCH ULTRA SYSTEM KIT) w/Device KIT 1 kit by Does not apply route once. 09/17/15   [name], MD  Cholecalciferol (VITAMIN D3) 50000 units TABS Take 1 tablet by mouth once a week. 08/16/16   [name], MD  clotrimazole (LOTRIMIN AF) 1 % cream Apply 1 application topically 2 (two) times daily. Rash under breast 08/16/16   [name], MD  glimepiride (AMARYL) 4 MG tablet Take 1 tablet (4 mg total) by mouth daily with breakfast. 08/16/16   [name], MD  glucose blood test strip Ck blood sugar twice a day and as directed. 09/17/15   [name], MD  losartan-hydrochlorothiazide (HYZAAR) 100-25 MG tablet Take 1 tablet by mouth daily. 08/16/16   [name], MD  nitroGLYCERIN (NITROSTAT) 0.4 MG SL tablet Place 0.4 mg under the tongue every 5 (five) minutes as needed for chest pain.     [provider]  [facility] DELICA LANCETS 38L MISC Check blood sugar twice a day and as directed. 09/17/15   [name], MD  pioglitazone (ACTOS) 30 MG tablet Take 1 tablet (30 mg total) by mouth daily. 08/16/16   [name], MD  potassium chloride (K-DUR) 10 MEQ tablet Take 1 tablet (10 mEq total) by mouth daily. 08/16/16   [name], MD  simvastatin (ZOCOR) 20 MG tablet Take 1 tablet (20 mg total) by mouth daily. 08/16/16   [name], MD    Medications: Prior to Admission:  (Not in a hospital admission)  Results for orders placed or performed during the hospital encounter of 09/02/16 (from the past 48 hour(s))  Protime-INR     Status: None   Collection Time: 09/02/16  2:40 PM  Result Value Ref Range   Prothrombin Time 12.6 11.4 - 15.2 seconds   INR 0.95   CBC     Status: Abnormal   Collection Time: 09/02/16  2:40 PM  Result Value Ref Range   WBC 9.1 4.0 - 10.5 K/uL   RBC 4.42 3.87 - 5.11 MIL/uL   Hemoglobin 11.8 (L) 12.0 - 15.0 g/dL   HCT 37.1 36.0 - 46.0 %   MCV 83.9 78.0 - 100.0 fL   MCH 26.7 26.0 - 34.0 pg   MCHC 31.8 30.0 - 36.0 g/dL   RDW 15.1 11.5 - 15.5 %   Platelets 228 150 - 400 K/uL  Differential     Status: None   Collection Time: 09/02/16  2:40 PM  Result Value Ref Range   Neutrophils Relative % 66 %   Neutro Abs 6.0 1.7 - 7.7 K/uL   Lymphocytes Relative 29 %   Lymphs Abs 2.6 0.7 - 4.0 K/uL   Monocytes Relative 5 %   Monocytes Absolute 0.4 0.1 - 1.0 K/uL   Eosinophils Relative 0 %   Eosinophils Absolute 0.0 0.0 - 0.7 K/uL   Basophils Relative 0 %   Basophils Absolute 0.0 0.0 - 0.1 K/uL  Comprehensive metabolic panel     Status: Abnormal   Collection Time: 09/02/16  2:40 PM  Result Value Ref Range   Sodium 138 135 - 145 mmol/L   Potassium 3.5 3.5 - 5.1 mmol/L   Chloride 105 101 - 111 mmol/L   CO2 26 22 - 32 mmol/L   Glucose, Bld 147 (H) 65 - 99 mg/dL   BUN 19 6 - 20 mg/dL   Creatinine, Ser 1.31 (H) 0.44 - 1.00 mg/dL    Calcium 9.1 8.9 - 10.3 mg/dL   Total Protein 6.9 6.5 - 8.1 g/dL  Albumin 3.3 (L) 3.5 - 5.0 g/dL   AST 17 15 - 41 U/L   ALT 12 (L) 14 - 54 U/L   Alkaline Phosphatase 87 38 - 126 U/L   Total Bilirubin 0.6 0.3 - 1.2 mg/dL   GFR calc non Af Amer 39 (L) >60 mL/min   GFR calc Af Amer 45 (L) >60 mL/min    Comment: (NOTE) The eGFR has been calculated using the CKD EPI equation. This calculation has not been validated in all clinical situations. eGFR's persistently <60 mL/min signify possible Chronic Kidney Disease.    Anion gap 7 5 - 15  I-stat troponin, ED     Status: None   Collection Time: 09/02/16  2:48 PM  Result Value Ref Range   Troponin i, poc 0.03 0.00 - 0.08 ng/mL   Comment 3            Comment: Due to the release kinetics of cTnI, a negative result within the first hours of the onset of symptoms does not rule out myocardial infarction with certainty. If myocardial infarction is still suspected, repeat the test at appropriate intervals.   I-Stat Chem 8, ED     Status: Abnormal   Collection Time: 09/02/16  2:50 PM  Result Value Ref Range   Sodium 141 135 - 145 mmol/L   Potassium 3.5 3.5 - 5.1 mmol/L   Chloride 104 101 - 111 mmol/L   BUN 22 (H) 6 - 20 mg/dL   Creatinine, Ser 1.40 (H) 0.44 - 1.00 mg/dL   Glucose, Bld 148 (H) 65 - 99 mg/dL   Calcium, Ion 1.13 (L) 1.15 - 1.40 mmol/L   TCO2 27 0 - 100 mmol/L   Hemoglobin 12.9 12.0 - 15.0 g/dL   HCT 38.0 36.0 - 46.0 %  CBG monitoring, ED     Status: Abnormal   Collection Time: 09/02/16  3:03 PM  Result Value Ref Range   Glucose-Capillary 120 (H) 65 - 99 mg/dL    Ct Head Code Stroke W/o Cm  Addendum Date: 09/02/2016   ADDENDUM REPORT: 09/02/2016 14:42 ADDENDUM: These results were called by telephone at the time of interpretation on 09/02/2016 at 2:25 pm to Dr. Orlena Sheldon , who verbally acknowledged these results. Electronically Signed   By: Franchot Gallo M.D.   On: 09/02/2016 14:42   Result Date: 09/02/2016 CLINICAL  DATA:  Code stroke.  Right arm weakness EXAM: CT HEAD WITHOUT CONTRAST TECHNIQUE: Contiguous axial images were obtained from the base of the skull through the vertex without intravenous contrast. COMPARISON:  CT 02/29/2016 FINDINGS: Brain: Ventricle size normal. Patchy white matter hypodensity similar to the prior study. Small chronic infarct in the right cerebellum. Negative for acute infarct. Negative for acute hemorrhage or mass. Vascular: Negative for hyperdense vessel. Skull: Negative Sinuses/Orbits: Negative Other: None ASPECTS ([place] Stroke Program Early CT Score) - Ganglionic level infarction (caudate, lentiform nuclei, internal capsule, insula, M1-M3 cortex): 7 - Supraganglionic infarction (M4-M6 cortex): 3 Total score (0-10 with 10 being normal): 10 IMPRESSION: 1. No acute abnormality 2. ASPECTS is 10 Electronically Signed: By: Franchot Gallo M.D. On: 09/02/2016 14:23    ROS Blood pressure (!) 156/69, pulse 70, temperature 98.3 F (36.8 C), temperature source Oral, resp. rate 12, height 5' 1" (1.549 m), weight 98.7 kg (217 lb 9.5 oz), SpO2 98 %. Neurologic Examination:  Awake, alert, oriented. Face symmetric. Tongue midline. Strength 4/5 RUE and RLE Right babinski. Decreased sensation on the right.  Assessment/Plan:  Acute stroke, probably subcortical.  I recommended IV tPA close to the 4.5 hour mark.  She refused due to risk of ICH.    Recommend admit to Internal Medicine and:  1. MRI Brain 2. MRA Brain and Neck 3. Change ASA to Plavix 75 mg qd 4. TTE  5. FLP and adjust statin if needed 6. Permissive hypertension for 24 hours 7. HgA1c  Rogue Jury, MD 09/02/2016, 3:29 PM

## 2016-09-02 NOTE — Code Documentation (Signed)
76 yo. Female w/ PMHx of left sided CVA, TIA, HTN, HLD, DM, OSA, obesity, frequent H/A and PE who had an acute onset of pain in her neck that progressed into right arm weakness. Patient presented to [facility] ED via POV and code stroke was called in triage d/t RUE weakness. Upon stroke team arrival, labs were drawn and patient taken to CT. CT with no acute intracranial abnormalities. ASPECTS 10. NIHSS 3. See EMR for NIHSS and code stroke times. On exam, patient with slight drift to her RUE and RLE and mild ataxia in her RUE. tPA was discussed with patient by the neurologist and patient declined treatment. Bedside handoff with ED RN Abigail Butts.

## 2016-09-02 NOTE — Evaluation (Signed)
Physical Therapy Evaluation Patient Details Name: Kristen [name] MRN: [id number] DOB: 01/29/1941 Today's Date: 09/02/2016   History of Present Illness  Kristen [name] is a very pleasant 76 y.o. female with medical history significant for CVA, diabetes, vertigo, coronary artery disease, abdominal aortic atherosclerosis, gout, hyperlipidemia, hypertension, obesity, DVT and PE presents to emergency department with the chief complaint of right arm weakness.  MRI shows Acute left perirolandic infarct, up to 18 mm in size. Punctate acute infarct in the right centrum semiovale.  Clinical Impression  Pt admitted with/for s/s of stroke mostly seen in R UE.  Pt currently limited functionally due to the problems listed below.  (see problems list.)  Pt will benefit from PT to maximize function and safety to be able to get home safely with available assist of family.     Follow Up Recommendations Home health PT;Supervision - Intermittent    Equipment Recommendations  None recommended by PT    Recommendations for Other Services       Precautions / Restrictions Precautions Precautions: Fall      Mobility  Bed Mobility Overal bed mobility: Needs Assistance Bed Mobility: Supine to Sit;Sit to Supine     Supine to sit: Supervision Sit to supine: Min guard   General bed mobility comments: effortful but no assist  Transfers Overall transfer level: Needs assistance   Transfers: Sit to/from Stand Sit to Stand: Min assist         General transfer comment: steady assist only  Ambulation/Gait Ambulation/Gait assistance: Min assist Ambulation Distance (Feet): 12 Feet (x2 ) Assistive device: 1 person hand held assist Gait Pattern/deviations: Step-through pattern   Gait velocity interpretation: Below normal speed for age/gender General Gait Details: mildly unsteady, min assist in lieu of a cane (not available)  Stairs            Wheelchair Mobility    Modified Rankin (Stroke  Patients Only) Modified Rankin (Stroke Patients Only) Pre-Morbid Rankin Score: No symptoms Modified Rankin: Moderately severe disability     Balance Overall balance assessment: Needs assistance Sitting-balance support: No upper extremity supported Sitting balance-Leahy Scale: Fair     Standing balance support: Single extremity supported Standing balance-Leahy Scale: Fair                               Pertinent Vitals/Pain Pain Assessment: No/denies pain    Home Living Family/patient expects to be discharged to:: Private residence Living Arrangements: Children Available Help at Discharge: Family;Friend(s);Available PRN/intermittently Type of Home: House Home Access: Stairs to enter     Home Layout: One level Home Equipment: Cane - quad;Cane - single point      Prior Function Level of Independence: Independent         Comments: pt indicates she only uses her cane if she is not feeling well.       Hand Dominance        Extremity/Trunk Assessment   Upper Extremity Assessment Upper Extremity Assessment: Defer to OT evaluation    Lower Extremity Assessment Lower Extremity Assessment: RLE deficits/detail;LLE deficits/detail RLE Deficits / Details: weak at 4/5 except hip flex 4-/5 and pf 4-/5, LT intact LLE Deficits / Details: mild weakness proximally, but functional       Communication   Communication: No difficulties  Cognition Arousal/Alertness: Awake/alert Behavior During Therapy: WFL for tasks assessed/performed Overall Cognitive Status: Within Functional Limits for tasks assessed  General Comments      Exercises     Assessment/Plan    PT Assessment Patient needs continued PT services  PT Problem List Decreased strength;Decreased activity tolerance;Decreased balance;Decreased mobility;Decreased knowledge of use of DME       PT Treatment Interventions DME instruction;Gait  training;Stair training;Functional mobility training;Therapeutic activities;Balance training;Patient/family education    PT Goals (Current goals can be found in the Care Plan section)  Acute Rehab PT Goals Patient Stated Goal: back home PT Goal Formulation: With patient Time For Goal Achievement: 09/16/16 Potential to Achieve Goals: Good    Frequency Min 3X/week   Barriers to discharge        Co-evaluation               AM-PAC PT "6 Clicks" Daily Activity  Outcome Measure Difficulty turning over in bed (including adjusting bedclothes, sheets and blankets)?: A Little Difficulty moving from lying on back to sitting on the side of the bed? : A Little Difficulty sitting down on and standing up from a chair with arms (e.g., wheelchair, bedside commode, etc,.)?: A Little Help needed moving to and from a bed to chair (including a wheelchair)?: A Little Help needed walking in hospital room?: A Little Help needed climbing 3-5 steps with a railing? : A Little 6 Click Score: 18    End of Session   Activity Tolerance: Patient tolerated treatment well Patient left: in bed;with call bell/phone within reach;with nursing/sitter in room Nurse Communication: Mobility status PT Visit Diagnosis: Unsteadiness on feet (R26.81);Muscle weakness (generalized) (M62.81)    Time: 2778-2423 PT Time Calculation (min) (ACUTE ONLY): 23 min   Charges:   PT Evaluation $PT Eval Moderate Complexity: 1 Procedure PT Treatments $Therapeutic Activity: 8-22 mins   PT G Codes:   PT G-Codes **NOT FOR INPATIENT CLASS** Functional Assessment Tool Used: AM-PAC 6 Clicks Basic Mobility;Clinical judgement Functional Limitation: Mobility: Walking and moving around Mobility: Walking and Moving Around Current Status (N3614): At least 20 percent but less than 40 percent impaired, limited or restricted Mobility: Walking and Moving Around Goal Status [phone]): At least 1 percent but less than 20 percent impaired,  limited or restricted    09/02/2016  Donnella Sham, PT [phone]  (pager)  Kristen [name] 09/02/2016, 6:10 PM

## 2016-09-02 NOTE — H&P (Signed)
History and Physical    Kristen [name] MRN:[id number] DOB: 10/10/1940 DOA: 09/02/2016  PCP: Gutierrez, Javier, MD Patient coming from: home  Chief Complaint: right arm weakness  HPI: Kristen [name] is a very pleasant 76 y.o. female with medical history significant for CVA, diabetes, vertigo, coronary artery disease, abdominal aortic atherosclerosis, gout, hyperlipidemia, hypertension, obesity, DVT and PE presents to emergency department with the chief complaint of right arm weakness. Initial evaluation concerning for stroke.  Information is obtained from the patient. She reports being in her usual state of health until around 10:30 AM she developed some "tightness" into her right shoulder and neck muscles. She states that it felt like she was trying to raise her right arm and someone was holding her shoulder down. She states she had "no control" of her right arm. She reports she was unable to move the arm above waist high without the assistance of left arm. She states symptoms were reminiscent of her previous stroke. She denies headache visual disturbances numbness tingling of extremities. She denies chest pain palpitations shortness of breath. She does report an unsteady gait and that right leg was "going in a different direction". She denies abdominal pain nausea vomiting diarrhea constipation melena. She denies dysuria hematuria frequency or urgency. She denies fever chills or sick contacts or recent travel.  ED Course: In the emergency department she's afebrile hypertensive not hypoxic. She is evaluated by neurology who recommended TPA. She declines.  Review of Systems: As per HPI otherwise all other systems reviewed and are negative.   Ambulatory Status: Ambulates independently with a fairly steady gait. No recent falls  Past Medical History:  Diagnosis Date  . Abdominal aortic atherosclerosis (HCC) by CT 02/2014  . CAD (coronary artery disease)    by CT, per pt h/o MI  . Diabetes type  2, uncontrolled (HCC)   . Frequent headaches   . GERD (gastroesophageal reflux disease)   . History of pulmonary embolism 2012  . HLD (hyperlipidemia)   . HTN (hypertension)   . 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 Morbid obesity (HCC)   . Myocardial infarction (HCC) 2012   per pt. report, states she was treated with medicine, here at [facility]    . Osteoarthritis    knees  . Primary localized osteoarthritis of left knee 09/22/2014  . Sleep apnea 2011   study done in [place], states that since she lost weight she doesn't use the CPAP any longer & she doesn't have a problem with sleep apnea  . Stroke (HCC)    still has balance problem on occas. , uses cane but that's mainly for the left knee pain  . Syncope 03/01/2016  . Thoracic aortic atherosclerosis (HCC) 12/2015   by CXR  . Vertigo    hx. benign postitional postural    Past Surgical History:  Procedure Laterality Date  . CATARACT EXTRACTION Bilateral 2013  . EYE SURGERY     /w IOL  . FOOT SURGERY Right   . PARTIAL HYSTERECTOMY     for fibroids, ovaries remain  . TONSILLECTOMY    . TOTAL KNEE ARTHROPLASTY Right 1990s  . TOTAL KNEE ARTHROPLASTY Left 09/22/2014   Joshua Landau, MD  . TUBAL LIGATION      Social History   Social History  . Marital status: Widowed    Spouse name: N/A  . Number of children: N/A  . Years of   education: N/A   Occupational History  . Not on file.   Social History Main Topics  . Smoking status: Never Smoker  . Smokeless tobacco: Never Used     Comment: tobacco use- no   . Alcohol use 0.0 oz/week     Comment: Rare  . Drug use: No  . Sexual activity: Not on file   Other Topics Concern  . Not on file   Social History Narrative   Lives alone   4 grown children   Education: 11th grade   Occupation: retired, was assembly line worker   Activity: Does not regularly exercise.    Diet: good water,  fruits/vegetables seldom    Allergies  Allergen Reactions  . Acetaminophen Other (See Comments)    "Causes me to spit up blood"  . Aleve [Naproxen Sodium] Other (See Comments)    Spits up blood  . Doxycycline Other (See Comments)    Malaise, GI upset, "felt drunk" and very ill  . Metformin And Related Other (See Comments)    Chills, dizziness  . Penicillins Rash    Has patient had a PCN reaction causing immediate rash, facial/tongue/throat swelling, SOB or lightheadedness with hypotension: Yes Has patient had a PCN reaction causing severe rash involving mucus membranes or skin necrosis: No Has patient had a PCN reaction that required hospitalization No Has patient had a PCN reaction occurring within the last 10 years: No If all of the above answers are "NO", then may proceed with Cephalosporin use.     Family History  Problem Relation Age of Onset  . Cancer Mother        bone  . Diabetes Father   . Hypertension Father   . Cancer Son 31       lung  . Congenital heart disease Son 17    Prior to Admission medications   Medication Sig Start Date End Date Taking? Authorizing Provider  Blood Glucose Monitoring Suppl (ONE TOUCH ULTRA SYSTEM KIT) w/Device KIT 1 kit by Does not apply route once. 09/17/15   Gutierrez, Javier, MD  Cholecalciferol (VITAMIN D3) 50000 units TABS Take 1 tablet by mouth once a week. 08/16/16   Gutierrez, Javier, MD  clotrimazole (LOTRIMIN AF) 1 % cream Apply 1 application topically 2 (two) times daily. Rash under breast 08/16/16   Gutierrez, Javier, MD  glimepiride (AMARYL) 4 MG tablet Take 1 tablet (4 mg total) by mouth daily with breakfast. 08/16/16   Gutierrez, Javier, MD  glucose blood test strip Ck blood sugar twice a day and as directed. 09/17/15   Gutierrez, Javier, MD  losartan-hydrochlorothiazide (HYZAAR) 100-25 MG tablet Take 1 tablet by mouth daily. 08/16/16   Gutierrez, Javier, MD  nitroGLYCERIN (NITROSTAT) 0.4 MG SL tablet Place 0.4 mg under the  tongue every 5 (five) minutes as needed for chest pain.    [provider]  ONETOUCH DELICA LANCETS 33G MISC Check blood sugar twice a day and as directed. 09/17/15   Gutierrez, Javier, MD  pioglitazone (ACTOS) 30 MG tablet Take 1 tablet (30 mg total) by mouth daily. 08/16/16   Gutierrez, Javier, MD  potassium chloride (K-DUR) 10 MEQ tablet Take 1 tablet (10 mEq total) by mouth daily. 08/16/16   Gutierrez, Javier, MD  simvastatin (ZOCOR) 20 MG tablet Take 1 tablet (20 mg total) by mouth daily. 08/16/16   Gutierrez, Javier, MD    Physical Exam: Vitals:   09/02/16 1452 09/02/16 1452 09/02/16 1530 09/02/16 1545  BP:   (!) 156/76 (!) 157/72    Pulse:   68 (!) 59  Resp:   14 15  Temp: 98.3 F (36.8 C)     TempSrc: Oral     SpO2:   99% 97%  Weight:  98.7 kg (217 lb 9.5 oz)    Height:  5' 1" (1.549 m)       General:  Appears calm and comfortable, in no acute distress Eyes:  PERRL, EOMI, normal lids, iris ENT:  grossly normal hearing, lips & tongue, mucous membranes of her mouth are pink slightly dry Neck:  no LAD, masses or thyromegaly Cardiovascular:  RRR, no m/r/g. No LE edema. Pedal pulses present and palpable Respiratory:  CTA bilaterally, no w/r/r. Normal respiratory effort. Abdomen:  soft, ntnd, obese positive bowel sounds no guarding or rebounding Skin:  no rash or induration seen on limited exam Musculoskeletal:  grossly normal tone BUE/BLE, good ROM, no bony abnormality Psychiatric:  grossly normal mood and affect, speech fluent and appropriate, AOx3 Neurologic:  She is alert and oriented 3 speech clear facial symmetry tongue midline. Right arm strength 2/5. Left arm strength 4 out of 5. Bilateral lower extremity edema 5 out of 5. Decreased sensation on right. Unable to hold right arm up  Labs on Admission: I have personally reviewed following labs and imaging studies  CBC:  Recent Labs Lab 09/02/16 1440 09/02/16 1450  WBC 9.1  --   NEUTROABS 6.0  --   HGB 11.8* 12.9   HCT 37.1 38.0  MCV 83.9  --   PLT 228  --    Basic Metabolic Panel:  Recent Labs Lab 09/02/16 1440 09/02/16 1450  NA 138 141  K 3.5 3.5  CL 105 104  CO2 26  --   GLUCOSE 147* 148*  BUN 19 22*  CREATININE 1.31* 1.40*  CALCIUM 9.1  --    GFR: Estimated Creatinine Clearance: 37.4 mL/min (A) (by C-G formula based on SCr of 1.4 mg/dL (H)). Liver Function Tests:  Recent Labs Lab 09/02/16 1440  AST 17  ALT 12*  ALKPHOS 87  BILITOT 0.6  PROT 6.9  ALBUMIN 3.3*   No results for input(s): LIPASE, AMYLASE in the last 168 hours. No results for input(s): AMMONIA in the last 168 hours. Coagulation Profile:  Recent Labs Lab 09/02/16 1440  INR 0.95   Cardiac Enzymes: No results for input(s): CKTOTAL, CKMB, CKMBINDEX, TROPONINI in the last 168 hours. BNP (last 3 results) No results for input(s): PROBNP in the last 8760 hours. HbA1C: No results for input(s): HGBA1C in the last 72 hours. CBG:  Recent Labs Lab 09/02/16 1503  GLUCAP 120*   Lipid Profile: No results for input(s): CHOL, HDL, LDLCALC, TRIG, CHOLHDL, LDLDIRECT in the last 72 hours. Thyroid Function Tests: No results for input(s): TSH, T4TOTAL, FREET4, T3FREE, THYROIDAB in the last 72 hours. Anemia Panel: No results for input(s): VITAMINB12, FOLATE, FERRITIN, TIBC, IRON, RETICCTPCT in the last 72 hours. Urine analysis:    Component Value Date/Time   COLORURINE YELLOW 02/28/2016 2152   APPEARANCEUR CLEAR 02/28/2016 2152   APPEARANCEUR Clear 03/10/2014 1233   LABSPEC 1.025 02/28/2016 2152   LABSPEC 1.017 03/10/2014 1233   PHURINE 5.5 02/28/2016 2152   GLUCOSEU 250 (A) 02/28/2016 2152   GLUCOSEU Negative 03/10/2014 1233   HGBUR TRACE (A) 02/28/2016 2152   BILIRUBINUR NEGATIVE 02/28/2016 2152   BILIRUBINUR Negative 04/01/2014 1102   BILIRUBINUR Negative 03/10/2014 1233   KETONESUR NEGATIVE 02/28/2016 2152   PROTEINUR NEGATIVE 02/28/2016 2152   UROBILINOGEN 0.2 04/01/2014 1102     UROBILINOGEN 1.0  04/24/2008 2109   NITRITE NEGATIVE 02/28/2016 2152   LEUKOCYTESUR NEGATIVE 02/28/2016 2152   LEUKOCYTESUR Negative 03/10/2014 1233    Creatinine Clearance: Estimated Creatinine Clearance: 37.4 mL/min (A) (by C-G formula based on SCr of 1.4 mg/dL (H)).  Sepsis Labs: @LABRCNTIP(procalcitonin:4,lacticidven:4) )No results found for this or any previous visit (from the past 240 hour(s)).   Radiological Exams on Admission: Ct Head Code Stroke W/o Cm  Addendum Date: 09/02/2016   ADDENDUM REPORT: 09/02/2016 14:42 ADDENDUM: These results were called by telephone at the time of interpretation on 09/02/2016 at 2:25 pm to Dr. Eshraghi , who verbally acknowledged these results. Electronically Signed   By: Charles  Clark M.D.   On: 09/02/2016 14:42   Result Date: 09/02/2016 CLINICAL DATA:  Code stroke.  Right arm weakness EXAM: CT HEAD WITHOUT CONTRAST TECHNIQUE: Contiguous axial images were obtained from the base of the skull through the vertex without intravenous contrast. COMPARISON:  CT 02/29/2016 FINDINGS: Brain: Ventricle size normal. Patchy white matter hypodensity similar to the prior study. Small chronic infarct in the right cerebellum. Negative for acute infarct. Negative for acute hemorrhage or mass. Vascular: Negative for hyperdense vessel. Skull: Negative Sinuses/Orbits: Negative Other: None ASPECTS (Alberta Stroke Program Early CT Score) - Ganglionic level infarction (caudate, lentiform nuclei, internal capsule, insula, M1-M3 cortex): 7 - Supraganglionic infarction (M4-M6 cortex): 3 Total score (0-10 with 10 being normal): 10 IMPRESSION: 1. No acute abnormality 2. ASPECTS is 10 Electronically Signed: By: Charles  Clark M.D. On: 09/02/2016 14:23    EKG: Independently reviewed. Sinus rhythm Borderline T abnormalities, anterior leads  Assessment/Plan Principal Problem:   Right arm weakness Active Problems:   Hypertension, uncontrolled   History of DVT (deep vein thrombosis)   HLD  (hyperlipidemia)   Obesity, Class II, BMI 35-39.9, with comorbidity (HCC)   Diabetes type 2, uncontrolled (HCC)   Benign paroxysmal positional vertigo   CAD (coronary artery disease)/coronary calcifications   Anemia   History of CVA (cerebrovascular accident)   Acute kidney injury (HCC)   #1. Right arm weakness/stroke like symptoms. CT of the head negative. Patient with a history of same as well as hyperlipidemia hypertension diabetes. Evaluated by neurology in the emergency department who opined likely acute stroke subcortical and recommended TPA and patient refused. -Admit to telemetry -MRI of the brain -MRA of brain and neck -Echocardiogram -Carotid Dopplers -Hemoglobin A1c and fasting lipid panel -Plavix per neuro recommendation -Permissive hypertension for 24 hours per neurology -Speech therapy occupational therapy physical therapy -Heart healthy car modified diet once she passes the swallow eval  2. Acute kidney injury. Likely related to decreased oral intake in the setting of hydrochlorothiazide and ARB. Creatinine 1.40 on admission. Chart review indicates creatinine within the limits of normal in November 2017. -Hold nephrotoxins for now -Gentle IV fluids -Monitor urine output -Recheck in the morning  #3. Hypertension. Blood pressure the high end of normal in the emergency department. Home medications include losartan and hydrochlorothiazide. Neurology recommending permissive hypertension for 24 hours -We will hold home antihypertensive meds for now -When necessary hydralazine with parameters  #4. Diabetes type 2. Home medications include oral agents. Serum glucose 147 -We will hold oral agents for now as appetite unreliable -Obtain hemoglobin A1c -Sliding scale insulin for optimal control  #5. Hyperlipidemia. -Continue statin -Lipid panel  #6. Anemia. Likely related to chronic disease. Stable at baseline  #7. CAD. No chest pain. EKG was normal sinus rhythm. Home  medications include 81 mg of aspirin daily -plavix   #  8. History of DVT. Was on Coumadin but stopped by PCP when she started "spitting up blood"    DVT prophylaxis: plavix  Code Status: full  Family Communication: son at bedside  Disposition Plan: home hopefully tomorrow  Consults called:  Eshraghi neurology Admission status: obs    , M MD [facility]  If 7PM-7AM, please contact night-coverage [email or web address] Password [email or web address]  09/02/2016, 3:53 PM    

## 2016-09-02 NOTE — ED Provider Notes (Signed)
Medical screening examination/treatment/procedure(s) were conducted as a shared visit with non-physician practitioner(s) and myself.  I personally evaluated the patient during the encounter.   EKG Interpretation None      76 year old female who presents with right arm weakness at 10:30 AM while washing clothes. Came in as Code Stroke and evaluated by neurology. Patient refused tPA, which was initially offered by neurology. CT head visualized and negative for ICH. Admitted to medicine service for ongoing stroke management.   Forde Dandy, MD 09/02/16 [phone]

## 2016-09-02 NOTE — ED Provider Notes (Signed)
[facility] DEPT Provider Note   CSN: [id number] Arrival date & time: 09/02/16  1402     History   Chief Complaint Chief Complaint  Patient presents with  . Code Stroke  . Neck Pain    HPI Kristen [name] is a 76 y.o. female.  Kristen [name] is a 76 y.o. Female with history of a previous stroke, diabetes, vertigo, coronary artery disease who presents to the emergency department as a code stroke today. Patient reports around 10:30 AM she began feeling some tightness to her right shoulder and neck muscles and then felt like her right arm was weak as well as some associated right leg weakness. She reports this is persisted since 10:30 AM. She reports it seems to be worse in her right arm. She is unable to use her right arm or hold it above her head. She has had previous strokes before. She is not on anticoagulants. She denies any injury or trauma to her head or neck. She denies headache, fevers, abdominal pain, nausea, vomiting, chest pain, shortness of breath, head injury, room spinning dizziness, changes to her vision, or urinary symptoms.   The history is provided by the patient and medical records. No language interpreter was used.  Neck Pain   Associated symptoms include numbness and weakness. Pertinent negatives include no chest pain and no headaches.    Past Medical History:  Diagnosis Date  . Abdominal aortic atherosclerosis ([place]) by CT 02/2014  . CAD (coronary artery disease)    by CT, per pt h/o MI  . Diabetes type 2, uncontrolled ([place])   . Frequent headaches   . GERD (gastroesophageal reflux disease)   . History of pulmonary embolism 2012  . HLD (hyperlipidemia)   . HTN (hypertension)   . 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 Morbid obesity ([place])   . Myocardial infarction [facility]) 2012   per pt. report, states she was treated with medicine, here at [facility]    . Osteoarthritis    knees  . Primary localized osteoarthritis of left knee 09/22/2014  . Sleep apnea 2011   study done in [place], states that since she lost weight she doesn't use the CPAP any longer & she doesn't have a problem with sleep apnea  . Stroke [facility])    still has balance problem on occas. , uses cane but that's mainly for the left knee pain  . Syncope 03/01/2016  . Thoracic aortic atherosclerosis ([place]) 12/2015   by CXR  . Vertigo    hx. benign postitional postural    Patient Active Problem List   Diagnosis Date Noted  . Right arm weakness 09/02/2016  . Right sided weakness 09/02/2016  . Intertrigo 08/16/2016  . Vitamin D deficiency 08/08/2016  . Gout 03/24/2016  . Syncope 02/29/2016  . Acute kidney injury ([place]) 02/29/2016  . Concussion 02/29/2016  . Arthritis of left wrist 02/07/2016  . Hemoptysis 12/31/2015  . Thoracic aortic atherosclerosis ([place]) 12/24/2015  . History of CVA (cerebrovascular accident) 11/03/2015  . Abdominal aortic atherosclerosis ([place])   . Anemia 06/18/2015  . Sensorineural hearing loss (SNHL) of both ears 03/29/2015  . Axillary mass 03/10/2015  . Pain of right middle finger 12/22/2014  . Memory deficit 12/04/2014  . Primary osteoarthritis of left knee 01/27/2014  . Advanced care planning/counseling discussion 12/11/2013  . Medicare annual wellness visit, subsequent 12/11/2013  . Health maintenance examination 12/11/2013  .  CAD (coronary artery disease)/coronary calcifications   . Chest pain 07/17/2013  . Benign paroxysmal positional vertigo 05/26/2013  . HLD (hyperlipidemia)   . Obesity, Class II, BMI 35-39.9, with comorbidity ([place])   . History of pulmonary embolism   . Osteoarthritis   . Diabetes type 2, uncontrolled ([place])   . History of DVT (deep vein thrombosis) 12/21/2010  . Hypertension, uncontrolled 05/13/2009    Past Surgical History:  Procedure Laterality Date  . CATARACT EXTRACTION Bilateral 2013  . EYE SURGERY     /w IOL  . FOOT SURGERY  Right   . PARTIAL HYSTERECTOMY     for fibroids, ovaries remain  . TONSILLECTOMY    . TOTAL KNEE ARTHROPLASTY Right 1990s  . TOTAL KNEE ARTHROPLASTY Left 09/22/2014   [name], MD  . TUBAL LIGATION      OB History    No data available       Home Medications    Prior to Admission medications   Medication Sig Start Date End Date Taking? Authorizing Provider  glimepiride (AMARYL) 4 MG tablet Take 1 tablet (4 mg total) by mouth daily with breakfast. 08/16/16  Yes [name], MD  nitroGLYCERIN (NITROSTAT) 0.4 MG SL tablet Place 0.4 mg under the tongue every 5 (five) minutes as needed for chest pain.   Yes [provider]  pioglitazone (ACTOS) 30 MG tablet Take 1 tablet (30 mg total) by mouth daily. 08/16/16  Yes [name], MD  potassium chloride (K-DUR) 10 MEQ tablet Take 1 tablet (10 mEq total) by mouth daily. Patient taking differently: Take 10 mEq by mouth every Monday, Wednesday, and Friday.  08/16/16  Yes [name], MD  Vitamin D, Ergocalciferol, (DRISDOL) 50000 units CAPS capsule Take 50,000 Units by mouth every 7 (seven) days.   Yes [provider]  Blood Glucose Monitoring Suppl (ONE TOUCH ULTRA SYSTEM KIT) w/Device KIT 1 kit by Does not apply route once. 09/17/15   [name], MD  Cholecalciferol (VITAMIN D3) 50000 units TABS Take 1 tablet by mouth once a week. 08/16/16   [name], MD  clotrimazole (LOTRIMIN AF) 1 % cream Apply 1 application topically 2 (two) times daily. Rash under breast 08/16/16   [name], MD  glucose blood test strip Ck blood sugar twice a day and as directed. 09/17/15   [name], MD  losartan-hydrochlorothiazide (HYZAAR) 100-25 MG tablet Take 1 tablet by mouth daily. 08/16/16   [name], MD  [facility] DELICA LANCETS 01E MISC Check blood sugar twice a day and as directed. 09/17/15   [name], MD  simvastatin (ZOCOR) 20 MG tablet Take 1 tablet (20 mg total) by mouth daily.  08/16/16   [name], MD    Family History Family History  Problem Relation Age of Onset  . Cancer Mother        bone  . Diabetes Father   . Hypertension Father   . Cancer Son 31       lung  . Congenital heart disease Son 88    Social History Social History  Substance Use Topics  . Smoking status: Never Smoker  . Smokeless tobacco: Never Used     Comment: tobacco use- no   . Alcohol use 0.0 oz/week     Comment: Rare     Allergies   Acetaminophen; Aleve [naproxen sodium]; Doxycycline; Metformin and related; and Penicillins   Review of Systems Review of Systems  Constitutional: Negative for chills and fever.  HENT: Negative for congestion and sore  throat.   Eyes: Negative for pain and visual disturbance.  Respiratory: Negative for cough and shortness of breath.   Cardiovascular: Negative for chest pain.  Gastrointestinal: Negative for abdominal pain, diarrhea, nausea and vomiting.  Genitourinary: Negative for dysuria.  Musculoskeletal: Positive for arthralgias and neck pain. Negative for back pain.  Skin: Negative for rash.  Neurological: Positive for weakness and numbness. Negative for dizziness, syncope, speech difficulty, light-headedness and headaches.     Physical Exam Updated Vital Signs BP (!) 157/72   Pulse (!) 59   Temp 98.3 F (36.8 C) (Oral)   Resp 15   Ht '5\' 1"'  (1.549 m)   Wt 98.7 kg   SpO2 97%   BMI 41.11 kg/m   Physical Exam  Constitutional: She is oriented to person, place, and time. She appears well-developed and well-nourished. No distress.  Nontoxic appearing.  HENT:  Head: Normocephalic and atraumatic.  Mouth/Throat: Oropharynx is clear and moist.  Eyes: Conjunctivae and EOM are normal. Pupils are equal, round, and reactive to light. Right eye exhibits no discharge. Left eye exhibits no discharge.  Neck: Neck supple.  Cardiovascular: Normal rate, regular rhythm, normal heart sounds and intact distal pulses.  Exam reveals no  gallop and no friction rub.   No murmur heard. Bilateral radial pulses are intact.  Pulmonary/Chest: Effort normal and breath sounds normal. No respiratory distress. She has no wheezes. She has no rales.  Lungs are clear to ascultation bilaterally. Symmetric chest expansion bilaterally. No increased work of breathing. No rales or rhonchi.    Abdominal: Soft. There is no tenderness.  Musculoskeletal: She exhibits no edema.  Lymphadenopathy:    She has no cervical adenopathy.  Neurological: She is alert and oriented to person, place, and time. No cranial nerve deficit. She exhibits abnormal muscle tone. Coordination normal.  Patient has right upper arm weakness including decreased grip strength her right hand. She is unable to hold very hand up for more than 5 seconds. She is unable to lift her arm above her head. She has some slight weakness noted to her right lower leg. Left leg strength is normal. She also reports some decreased sensation to the right side of her face. Cranial nerves are otherwise intact. Speech is clear and coherent.  Skin: Skin is warm and dry. Capillary refill takes less than 2 seconds. No rash noted. She is not diaphoretic. No erythema. No pallor.  Psychiatric: She has a normal mood and affect. Her behavior is normal.  Nursing note and vitals reviewed.    ED Treatments / Results  Labs (all labs ordered are listed, but only abnormal results are displayed) Labs Reviewed  APTT - Abnormal; Notable for the following:       Result Value   aPTT 21 (*)    All other components within normal limits  CBC - Abnormal; Notable for the following:    Hemoglobin 11.8 (*)    All other components within normal limits  COMPREHENSIVE METABOLIC PANEL - Abnormal; Notable for the following:    Glucose, Bld 147 (*)    Creatinine, Ser 1.31 (*)    Albumin 3.3 (*)    ALT 12 (*)    GFR calc non Af Amer 39 (*)    GFR calc Af Amer 45 (*)    All other components within normal limits  CBG  MONITORING, ED - Abnormal; Notable for the following:    Glucose-Capillary 120 (*)    All other components within normal limits  I-STAT CHEM 8,  ED - Abnormal; Notable for the following:    BUN 22 (*)    Creatinine, Ser 1.40 (*)    Glucose, Bld 148 (*)    Calcium, Ion 1.13 (*)    All other components within normal limits  PROTIME-INR  DIFFERENTIAL  I-STAT TROPOININ, ED    EKG  EKG Interpretation  Date/Time:  Saturday Sep 02 2016 14:47:37 EDT Ventricular Rate:  73 PR Interval:    QRS Duration: 91 QT Interval:  421 QTC Calculation: 464 R Axis:   -9 Text Interpretation:  Sinus rhythm Borderline T abnormalities, anterior leads no acute changes from prior 02/28/2016 Confirmed by LIU MD, DANA [phone]) on 09/02/2016 4:11:45 PM       Radiology Ct Head Code Stroke W/o Cm  Addendum Date: 09/02/2016   ADDENDUM REPORT: 09/02/2016 14:42 ADDENDUM: These results were called by telephone at the time of interpretation on 09/02/2016 at 2:25 pm to Dr. Orlena Sheldon , who verbally acknowledged these results. Electronically Signed   By: Franchot Gallo M.D.   On: 09/02/2016 14:42   Result Date: 09/02/2016 CLINICAL DATA:  Code stroke.  Right arm weakness EXAM: CT HEAD WITHOUT CONTRAST TECHNIQUE: Contiguous axial images were obtained from the base of the skull through the vertex without intravenous contrast. COMPARISON:  CT 02/29/2016 FINDINGS: Brain: Ventricle size normal. Patchy white matter hypodensity similar to the prior study. Small chronic infarct in the right cerebellum. Negative for acute infarct. Negative for acute hemorrhage or mass. Vascular: Negative for hyperdense vessel. Skull: Negative Sinuses/Orbits: Negative Other: None ASPECTS ([place] Stroke Program Early CT Score) - Ganglionic level infarction (caudate, lentiform nuclei, internal capsule, insula, M1-M3 cortex): 7 - Supraganglionic infarction (M4-M6 cortex): 3 Total score (0-10 with 10 being normal): 10 IMPRESSION: 1. No acute abnormality 2.  ASPECTS is 10 Electronically Signed: By: Franchot Gallo M.D. On: 09/02/2016 14:23    Procedures Procedures (including critical care time)  CRITICAL CARE Performed by: Hanley Hays   Total critical care time: 45 minutes  Critical care time was exclusive of separately billable procedures and treating other patients.  Critical care was necessary to treat or prevent imminent or life-threatening deterioration.  Critical care was time spent personally by me on the following activities: development of treatment plan with patient and/or surrogate as well as nursing, discussions with consultants, evaluation of patient's response to treatment, examination of patient, obtaining history from patient or surrogate, ordering and performing treatments and interventions, ordering and review of laboratory studies, ordering and review of radiographic studies, pulse oximetry and re-evaluation of patient's condition.  Medications Ordered in ED Medications  potassium chloride (K-DUR) CR tablet 10 mEq (not administered)  simvastatin (ZOCOR) tablet 20 mg (not administered)   stroke: mapping our early stages of recovery book (not administered)  0.9 %  sodium chloride infusion (not administered)  acetaminophen (TYLENOL) tablet 650 mg (not administered)    Or  acetaminophen (TYLENOL) suppository 650 mg (not administered)  senna-docusate (Senokot-S) tablet 1 tablet (not administered)  clopidogrel (PLAVIX) tablet 75 mg (not administered)  labetalol (NORMODYNE,TRANDATE) injection 10 mg (10 mg Intravenous Given 09/02/16 1448)     Initial Impression / Assessment and Plan / ED Course  I have reviewed the triage vital signs and the nursing notes.  Pertinent labs & imaging results that were available during my care of the patient were reviewed by me and considered in my medical decision making (see chart for details).    This is a 76 y.o. Female with history of a previous stroke, diabetes,  vertigo,  coronary artery disease who presents to the emergency department as a code stroke today. Patient reports around 10:30 AM she began feeling some tightness to her right shoulder and neck muscles and then felt like her right arm was weak as well as some associated right leg weakness. She reports this is persisted since 10:30 AM. She reports it seems to be worse in her right arm. She is unable to use her right arm or hold it above her head. She has had previous strokes before. She is not on anticoagulants.   On exam patient is afebrile nontoxic appearing. She has right arm weakness on exam and slight right leg weakness on exam. NIH is 3.  CT head is unremarkable. Neurology is at bedside. Suspect stroke. We offered the patient TPA. Patient refuses.  Patient also does have elevated creatinine at 1.40. This is an increase from her baseline. Patient with AKI.   Neurology requests admission to hospitalist service for stroke workup. Patient agrees with plan for admission.  I consulted with [facility] hospitalist service who accepted the patient for admission.  This patient was discussed with and evaluated by Dr. Oleta Mouse who agrees with assessment and plan.  Final Clinical Impressions(s) / ED Diagnoses   Final diagnoses:  Cerebrovascular accident (CVA) due to other mechanism [facility])  Right arm weakness  Essential hypertension    New Prescriptions New Prescriptions   No medications on file     Waynetta Pean, Hershal Coria 09/02/16 [place], MD 09/03/16 [phone]

## 2016-09-03 ENCOUNTER — Observation Stay (HOSPITAL_COMMUNITY): Payer: Medicare Other

## 2016-09-03 ENCOUNTER — Encounter (HOSPITAL_COMMUNITY): Payer: Medicare Other

## 2016-09-03 DIAGNOSIS — Z90710 Acquired absence of both cervix and uterus: Secondary | ICD-10-CM | POA: Diagnosis not present

## 2016-09-03 DIAGNOSIS — Z7982 Long term (current) use of aspirin: Secondary | ICD-10-CM | POA: Diagnosis not present

## 2016-09-03 DIAGNOSIS — Z8249 Family history of ischemic heart disease and other diseases of the circulatory system: Secondary | ICD-10-CM | POA: Diagnosis not present

## 2016-09-03 DIAGNOSIS — E785 Hyperlipidemia, unspecified: Secondary | ICD-10-CM | POA: Diagnosis present

## 2016-09-03 DIAGNOSIS — E118 Type 2 diabetes mellitus with unspecified complications: Secondary | ICD-10-CM

## 2016-09-03 DIAGNOSIS — Z96653 Presence of artificial knee joint, bilateral: Secondary | ICD-10-CM | POA: Diagnosis present

## 2016-09-03 DIAGNOSIS — I10 Essential (primary) hypertension: Secondary | ICD-10-CM | POA: Diagnosis present

## 2016-09-03 DIAGNOSIS — Z833 Family history of diabetes mellitus: Secondary | ICD-10-CM | POA: Diagnosis not present

## 2016-09-03 DIAGNOSIS — I2510 Atherosclerotic heart disease of native coronary artery without angina pectoris: Secondary | ICD-10-CM | POA: Diagnosis present

## 2016-09-03 DIAGNOSIS — R29898 Other symptoms and signs involving the musculoskeletal system: Secondary | ICD-10-CM

## 2016-09-03 DIAGNOSIS — G4733 Obstructive sleep apnea (adult) (pediatric): Secondary | ICD-10-CM | POA: Diagnosis present

## 2016-09-03 DIAGNOSIS — I252 Old myocardial infarction: Secondary | ICD-10-CM | POA: Diagnosis not present

## 2016-09-03 DIAGNOSIS — G8191 Hemiplegia, unspecified affecting right dominant side: Secondary | ICD-10-CM | POA: Diagnosis present

## 2016-09-03 DIAGNOSIS — D638 Anemia in other chronic diseases classified elsewhere: Secondary | ICD-10-CM | POA: Diagnosis present

## 2016-09-03 DIAGNOSIS — Z86711 Personal history of pulmonary embolism: Secondary | ICD-10-CM | POA: Diagnosis not present

## 2016-09-03 DIAGNOSIS — M10071 Idiopathic gout, right ankle and foot: Secondary | ICD-10-CM | POA: Diagnosis present

## 2016-09-03 DIAGNOSIS — N179 Acute kidney failure, unspecified: Secondary | ICD-10-CM | POA: Diagnosis not present

## 2016-09-03 DIAGNOSIS — E1165 Type 2 diabetes mellitus with hyperglycemia: Secondary | ICD-10-CM

## 2016-09-03 DIAGNOSIS — E1151 Type 2 diabetes mellitus with diabetic peripheral angiopathy without gangrene: Secondary | ICD-10-CM | POA: Diagnosis present

## 2016-09-03 DIAGNOSIS — E669 Obesity, unspecified: Secondary | ICD-10-CM | POA: Diagnosis present

## 2016-09-03 DIAGNOSIS — Z6841 Body Mass Index (BMI) 40.0 and over, adult: Secondary | ICD-10-CM | POA: Diagnosis not present

## 2016-09-03 DIAGNOSIS — I639 Cerebral infarction, unspecified: Secondary | ICD-10-CM | POA: Diagnosis not present

## 2016-09-03 DIAGNOSIS — K219 Gastro-esophageal reflux disease without esophagitis: Secondary | ICD-10-CM | POA: Diagnosis present

## 2016-09-03 DIAGNOSIS — Z86718 Personal history of other venous thrombosis and embolism: Secondary | ICD-10-CM | POA: Diagnosis not present

## 2016-09-03 DIAGNOSIS — H8110 Benign paroxysmal vertigo, unspecified ear: Secondary | ICD-10-CM | POA: Diagnosis present

## 2016-09-03 DIAGNOSIS — I638 Other cerebral infarction: Secondary | ICD-10-CM | POA: Diagnosis not present

## 2016-09-03 DIAGNOSIS — M79671 Pain in right foot: Secondary | ICD-10-CM | POA: Diagnosis not present

## 2016-09-03 LAB — LIPID PANEL
CHOL/HDL RATIO: 2.8 ratio
CHOLESTEROL: 136 mg/dL (ref 0–200)
HDL: 49 mg/dL (ref >40)
LDL Cholesterol: 75 mg/dL (ref 0–99)
TRIGLYCERIDES: 62 mg/dL (ref <150)
VLDL: 12 mg/dL (ref 0–40)

## 2016-09-03 LAB — BASIC METABOLIC PANEL
Anion gap: 7 (ref 5–15)
BUN: 17 mg/dL (ref 6–20)
CALCIUM: 8.9 mg/dL (ref 8.9–10.3)
CO2: 28 mmol/L (ref 22–32)
Chloride: 102 mmol/L (ref 101–111)
Creatinine, Ser: 1.02 mg/dL — ABNORMAL HIGH (ref 0.44–1.00)
GFR calc Af Amer: >60 mL/min (ref >60)
GFR calc non Af Amer: 52 mL/min — ABNORMAL LOW (ref >60)
GLUCOSE: 187 mg/dL — AB (ref 65–99)
Potassium: 3.5 mmol/L (ref 3.5–5.1)
Sodium: 137 mmol/L (ref 135–145)

## 2016-09-03 MED ORDER — PRO-STAT SUGAR FREE PO LIQD
30.0000 mL | Freq: Two times a day (BID) | ORAL | Status: DC
  Administered 2016-09-03 – 2016-09-05 (×5): 30 mL via ORAL
  Filled 2016-09-03 (×5): qty 30

## 2016-09-03 MED ORDER — TRAMADOL HCL 50 MG PO TABS
50.0000 mg | ORAL_TABLET | Freq: Four times a day (QID) | ORAL | Status: DC | PRN
  Administered 2016-09-03: 50 mg via ORAL
  Filled 2016-09-03: qty 1

## 2016-09-03 MED ORDER — SIMVASTATIN 40 MG PO TABS
40.0000 mg | ORAL_TABLET | Freq: Every day | ORAL | Status: DC
  Administered 2016-09-03 – 2016-09-04 (×2): 40 mg via ORAL
  Filled 2016-09-03 (×2): qty 1

## 2016-09-03 MED ORDER — COLCHICINE 0.6 MG PO TABS
0.6000 mg | ORAL_TABLET | Freq: Every day | ORAL | Status: DC
  Administered 2016-09-03 – 2016-09-05 (×3): 0.6 mg via ORAL
  Filled 2016-09-03 (×3): qty 1

## 2016-09-03 MED ORDER — ASPIRIN EC 325 MG PO TBEC
325.0000 mg | DELAYED_RELEASE_TABLET | Freq: Every day | ORAL | Status: DC
  Administered 2016-09-03 – 2016-09-05 (×3): 325 mg via ORAL
  Filled 2016-09-03 (×3): qty 1

## 2016-09-03 NOTE — Evaluation (Signed)
Occupational Therapy Evaluation Patient Details Name: Kristen [name] MRN: [id number] DOB: 11/18/1940 Today's Date: 09/03/2016    History of Present Illness Kristen [name] is a very pleasant 76 y.o. female with medical history significant for CVA, diabetes, vertigo, coronary artery disease, abdominal aortic atherosclerosis, gout, hyperlipidemia, hypertension, obesity, DVT and PE presents to emergency department with the chief complaint of right arm weakness.  MRI shows Acute left perirolandic infarct, up to 18 mm in size. Punctate acute infarct in the right centrum semiovale.   Clinical Impression   Pt lives alone with intermittent use of cane. Pt not able to ambulate with OT this visit, transferred x 2 with min assist. Pt with complaints of pain in R foot limiting mobility. Pt presents with impaired balance and R side weakness with incoordination. She requires min to mod assist for ADL. Pt reports her family can provide 24 hour care at home. Will follow acutely.   Follow Up Recommendations  Home health OT;Supervision/Assistance - 24 hour    Equipment Recommendations  3 in 1 bedside commode    Recommendations for Other Services       Precautions / Restrictions Precautions Precautions: Fall Restrictions Weight Bearing Restrictions: No      Mobility Bed Mobility Overal bed mobility: Needs Assistance Bed Mobility: Supine to Sit     Supine to sit: Supervision     General bed mobility comments: effortful but no assist  Transfers Overall transfer level: Needs assistance Equipment used: 1 person hand held assist Transfers: Sit to/from Stand;Stand Pivot Transfers Sit to Stand: Min assist Stand pivot transfers: Min assist       General transfer comment: steadying assist    Balance Overall balance assessment: Needs assistance   Sitting balance-Leahy Scale: Fair   Postural control: Posterior lean   Standing balance-Leahy Scale: Poor Standing balance comment: requires  one hand on surface for balance                           ADL either performed or assessed with clinical judgement   ADL Overall ADL's : Needs assistance/impaired Eating/Feeding: Set up;Sitting Eating/Feeding Details (indicate cue type and reason): assist to open containers, spread jelly Grooming: Wash/dry hands;Wash/dry face;Sitting;Set up   Upper Body Bathing: Minimal assistance;Sitting   Lower Body Bathing: Moderate assistance;Sit to/from stand   Upper Body Dressing : Minimal assistance;Sitting   Lower Body Dressing: Moderate assistance;Sit to/from stand   Toilet Transfer: Minimal assistance;Stand-pivot;BSC   Toileting- Water quality scientist and Hygiene: Min guard;Sit to/from stand       Functional mobility during ADLs:  (pt not able to ambulate this visit 2* pain in R foot)       Vision Patient Visual Report: No change from baseline       Perception     Praxis      Pertinent Vitals/Pain Pain Assessment: Faces Faces Pain Scale: Hurts even more Pain Location: R foot Pain Descriptors / Indicators: Aching;Grimacing;Guarding Pain Intervention(s): Monitored during session;Limited activity within patient's tolerance     Hand Dominance Right   Extremity/Trunk Assessment Upper Extremity Assessment Upper Extremity Assessment: RUE deficits/detail RUE Deficits / Details: 4-/5 RUE Coordination: decreased fine motor;decreased gross motor   Lower Extremity Assessment Lower Extremity Assessment: Defer to PT evaluation       Communication Communication Communication: No difficulties   Cognition Arousal/Alertness: Awake/alert Behavior During Therapy: WFL for tasks assessed/performed Overall Cognitive Status: Within Functional Limits for tasks assessed  General Comments       Exercises     Shoulder Instructions      Home Living Family/patient expects to be discharged to:: Private residence Living  Arrangements: Alone Available Help at Discharge: Family;Friend(s);Available PRN/intermittently Type of Home: House Home Access: Stairs to enter     Home Layout: One level     Bathroom Shower/Tub: Occupational psychologist: Standard     Home Equipment: Cane - quad;Cane - single point          Prior Functioning/Environment Level of Independence: Independent        Comments: pt indicates she only uses her cane if she is not feeling well.          OT Problem List: Decreased strength;Decreased activity tolerance;Impaired balance (sitting and/or standing);Decreased coordination;Decreased safety awareness;Decreased knowledge of use of DME or AE;Pain;Obesity;Impaired UE functional use      OT Treatment/Interventions: Self-care/ADL training;DME and/or AE instruction;Balance training;Patient/family education;Therapeutic activities;Neuromuscular education    OT Goals(Current goals can be found in the care plan section) Acute Rehab OT Goals Patient Stated Goal: back home OT Goal Formulation: With patient Time For Goal Achievement: 09/10/16 Potential to Achieve Goals: Good ADL Goals Pt Will Perform Grooming: with min guard assist;standing Pt Will Perform Lower Body Bathing: with min guard assist;sit to/from stand Pt Will Perform Lower Body Dressing: with min guard assist;sit to/from stand Pt Will Transfer to Toilet: with min guard assist;ambulating;bedside commode (over toilet) Pt Will Perform Toileting - Clothing Manipulation and hygiene: with min guard assist;sit to/from stand Pt/caregiver will Perform Home Exercise Program: Increased strength;Right Upper extremity;Independently  OT Frequency: Min 3X/week   Barriers to D/C:            Co-evaluation              AM-PAC PT "6 Clicks" Daily Activity     Outcome Measure Help from another person eating meals?: A Little Help from another person taking care of personal grooming?: A Little Help from another person  toileting, which includes using toliet, bedpan, or urinal?: A Little Help from another person bathing (including washing, rinsing, drying)?: A Lot Help from another person to put on and taking off regular upper body clothing?: A Little Help from another person to put on and taking off regular lower body clothing?: A Lot 6 Click Score: 16   End of Session Equipment Utilized During Treatment: Gait belt  Activity Tolerance: Patient tolerated treatment well Patient left: in chair;with call bell/phone within reach  OT Visit Diagnosis: Unsteadiness on feet (R26.81);Pain;Hemiplegia and hemiparesis;Muscle weakness (generalized) (M62.81);Other abnormalities of gait and mobility (R26.89) Hemiplegia - Right/Left: Right Hemiplegia - dominant/non-dominant: Dominant Hemiplegia - caused by: Cerebral infarction Pain - Right/Left: Right Pain - part of body: Ankle and joints of foot                Time: 6203-5597 OT Time Calculation (min): 19 min Charges:  OT General Charges $OT Visit: 1 Procedure OT Evaluation $OT Eval Moderate Complexity: 1 Procedure G-Codes: OT G-codes **NOT FOR INPATIENT CLASS** Functional Assessment Tool Used: Clinical judgement Functional Limitation: Self care Self Care Current Status (C1638): At least 40 percent but less than 60 percent impaired, limited or restricted Self Care Goal Status (G5364): At least 1 percent but less than 20 percent impaired, limited or restricted     Malka So 09/03/2016, 8:38 AM  [phone]

## 2016-09-03 NOTE — Evaluation (Signed)
Speech Language Pathology Evaluation Patient Details Name: Kristen [name] MRN: [id number] DOB: 1940/06/20 Today's Date: 09/03/2016 Time: 1220-1233 SLP Time Calculation (min) (ACUTE ONLY): 13 min  Problem List:  Patient Active Problem List   Diagnosis Date Noted  . Right arm weakness 09/02/2016  . Right sided weakness 09/02/2016  . Intertrigo 08/16/2016  . Vitamin D deficiency 08/08/2016  . Gout 03/24/2016  . Syncope 02/29/2016  . Acute kidney injury ([place]) 02/29/2016  . Concussion 02/29/2016  . Arthritis of left wrist 02/07/2016  . Hemoptysis 12/31/2015  . Thoracic aortic atherosclerosis ([place]) 12/24/2015  . History of CVA (cerebrovascular accident) 11/03/2015  . Abdominal aortic atherosclerosis ([place])   . Anemia 06/18/2015  . Sensorineural hearing loss (SNHL) of both ears 03/29/2015  . Axillary mass 03/10/2015  . Pain of right middle finger 12/22/2014  . Memory deficit 12/04/2014  . Primary osteoarthritis of left knee 01/27/2014  . Advanced care planning/counseling discussion 12/11/2013  . Medicare annual wellness visit, subsequent 12/11/2013  . Health maintenance examination 12/11/2013  . CAD (coronary artery disease)/coronary calcifications   . Chest pain 07/17/2013  . Benign paroxysmal positional vertigo 05/26/2013  . HLD (hyperlipidemia)   . Obesity, Class II, BMI 35-39.9, with comorbidity ([facility])   . History of pulmonary embolism   . Osteoarthritis   . Diabetes type 2, uncontrolled ([place])   . History of DVT (deep vein thrombosis) 12/21/2010  . Hypertension, uncontrolled 05/13/2009   Past Medical History:  Past Medical History:  Diagnosis Date  . Abdominal aortic atherosclerosis ([place]) by CT 02/2014  . CAD (coronary artery disease)    by CT, per pt h/o MI  . Diabetes type 2, uncontrolled ([place])   . Frequent headaches   . GERD (gastroesophageal reflux disease)   . History of pulmonary embolism 2012  . HLD (hyperlipidemia)   . HTN (hypertension)   . Internal capsule  hemorrhage ([facility])    hx of sublacunar infarct involving the right posterior limb of the internal capsule   . Morbid obesity ([place])   . Myocardial infarction [facility]) 2012   per pt. report, states she was treated with medicine, here at [facility]    . Osteoarthritis    knees  . Primary localized osteoarthritis of left knee 09/22/2014  . Sleep apnea 2011   study done in [place], states that since she lost weight she doesn't use the CPAP any longer & she doesn't have a problem with sleep apnea  . Stroke [facility])    still has balance problem on occas. , uses cane but that's mainly for the left knee pain  . Syncope 03/01/2016  . Thoracic aortic atherosclerosis ([place]) 12/2015   by CXR  . Vertigo    hx. benign postitional postural   Past Surgical History:  Past Surgical History:  Procedure Laterality Date  . CATARACT EXTRACTION Bilateral 2013  . EYE SURGERY     /w IOL  . FOOT SURGERY Right   . PARTIAL HYSTERECTOMY     for fibroids, ovaries remain  . TONSILLECTOMY    . TOTAL KNEE ARTHROPLASTY Right 1990s  . TOTAL KNEE ARTHROPLASTY Left 09/22/2014   [name], MD  . TUBAL LIGATION     HPI:  76 y.o. female with medical history significant for CVA, diabetes, vertigo, coronary artery disease, abdominal aortic atherosclerosis, gout, hyperlipidemia, hypertension, obesity, DVT and PE presents to emergency department with the chief complaint of right arm weakness.  MRI shows Acute left perirolandic infarct, up to 18 mm in size. Punctate acute infarct  in the right centrum semiovale.   Assessment / Plan / Recommendation Clinical Impression  Pt presents with fluent speech, no dysarthria; expressive and receptive language are WNL.  No changes noted by pt or son.  No SLP f/u is needed - our services will sign off.     SLP Assessment  SLP Recommendation/Assessment: Patient does not need any further Speech Lanaguage Pathology Services    Follow Up Recommendations       Frequency and Duration            SLP Evaluation Cognition  Overall Cognitive Status: Within Functional Limits for tasks assessed Arousal/Alertness: Awake/alert Orientation Level: Oriented X4 Memory: Appears intact Executive Function: Reasoning Reasoning: Appears intact       Comprehension  Auditory Comprehension Overall Auditory Comprehension: Appears within functional limits for tasks assessed Yes/No Questions: Within Functional Limits Commands: Within Functional Limits Conversation: Complex Visual Recognition/Discrimination Discrimination: Within Function Limits Reading Comprehension Reading Status: Not tested    Expression Expression Primary Mode of Expression: Verbal Verbal Expression Overall Verbal Expression: Appears within functional limits for tasks assessed Written Expression Dominant Hand: Right Written Expression: Not tested   Oral / Motor  Oral Motor/Sensory Function Overall Oral Motor/Sensory Function: Within functional limits Motor Speech Overall Motor Speech: Appears within functional limits for tasks assessed   GO          Functional Assessment Tool Used: clinical judgment Functional Limitations: Spoken language expressive Spoken Language Expression Current Status (V6122): 0 percent impaired, limited or restricted Spoken Language Expression Goal Status (E4975): 0 percent impaired, limited or restricted Spoken Language Expression Discharge Status [id number]): 0 percent impaired, limited or restricted         Juan Quam Laurice 09/03/2016, 12:36 PM

## 2016-09-03 NOTE — Progress Notes (Signed)
*  PRELIMINARY RESULTS* Vascular Ultrasound Carotid Duplex (Doppler) has been completed.  Preliminary findings: Findings consistent with a 1-39 percent stenosis by velocity involving the right internal carotid artery and the left internal carotid artery.  Difficult exam due to patient body habitus, acoustic shadowing, tortuous vessels and a high bifurcation.  Difficult to evaluate plaque in the distal common carotid, internal and external carotid arteries.  Bilateral vertebral arteries appear patent and antegrade.  Everrett Coombe 09/03/2016, 5:50 PM

## 2016-09-03 NOTE — Progress Notes (Signed)
Patient ID: Kristen [name], female   DOB: Jul 10, 1940, 76 y.o.   MRN: [id number]                                                                PROGRESS NOTE                                                                                                                                                                                                             Patient Demographics:    Kristen [name], is a 76 y.o. female, DOB - 10/04/1940, TMH:[id number]  Admit date - 09/02/2016   Admitting Physician Elwin Mocha, MD  Outpatient Primary MD for the patient is [name], MD  LOS - 0  Outpatient Specialists:     Chief Complaint  Patient presents with  . Code Stroke  . Neck Pain       Brief Narrative  76 y.o. female with medical history significant for CVA, diabetes, vertigo, coronary artery disease, abdominal aortic atherosclerosis, gout, hyperlipidemia, hypertension, obesity, DVT and PE presents to emergency department with the chief complaint of right arm weakness. Initial evaluation concerning for stroke.  Information is obtained from the patient. She reports being in her usual state of health until around 10:30 AM she developed some "tightness" into her right shoulder and neck muscles. She states that it felt like she was trying to raise her right arm and someone was holding her shoulder down. She states she had "no control" of her right arm. She reports she was unable to move the arm above waist high without the assistance of left arm. She states symptoms were reminiscent of her previous stroke. She denies headache visual disturbances numbness tingling of extremities. She denies chest pain palpitations shortness of breath. She does report an unsteady gait and that right leg was "going in a different direction". She denies abdominal pain nausea vomiting diarrhea constipation melena. She denies dysuria hematuria frequency or urgency. She denies fever chills or sick contacts or recent  travel.  ED Course: In the emergency department she's afebrile hypertensive not hypoxic. She is evaluated by neurology    Subjective:    Kristen [name] today has improvement in her RUE weakness. She is currently eating breakfast with her right hand.  No vision change.pt denies numbness, tingling.  No headache, No chest pain, No abdominal pain - No Nausea, No new weakness tingling or numbness, No Cough - SOB.    Assessment  & Plan :    Principal Problem:   Right arm weakness Active Problems:   Hypertension, uncontrolled   History of DVT (deep vein thrombosis)   HLD (hyperlipidemia)   Obesity, Class II, BMI 35-39.9, with comorbidity ([facility])   Diabetes type 2, uncontrolled ([facility])   Benign paroxysmal positional vertigo   CAD (coronary artery disease)/coronary calcifications   Anemia   History of CVA (cerebrovascular accident)   Acute kidney injury ([place])   #1. Right arm weakness/stroke l CT brain 5/12 =>negative. Pt declined TPA MRI brain 5/12=>  1. Acute left perirolandic infarct, up to 18 mm in size. Punctate acute infarct in the right centrum semiovale. 2. Chronic small vessel ischemia and remote micro hemorrhages  Carotid ultrasound, Cardiac echo pending D/C Plavix , she was not on any aspirin prior to CVA Aspirin Cont simvastatin Permissive hypertension for 24 hours per neurology Speech therapy occupational therapy physical therapy   Acute kidney injury. Likely related to decreased oral intake in the setting of hydrochlorothiazide and ARB. Creatinine 1.40 on admission. Chart review indicates creatinine within the limits of normal in November 2017. cmp in am  Hypertension. Blood pressure the high end of normal in the emergency department. Home medications include losartan and hydrochlorothiazide. Neurology recommending permissive hypertension for 24 hours -hold home antihypertensive meds for now -When necessary hydralazine with parameters  Diabetes type 2. Home  medications include oral agents. -We will hold oral agents for now as appetite unreliable -hemoglobin A1c pending -Sliding scale insulin for optimal control  Hyperlipidemia. -Continue statin -Lipid panel  Anemia. Likely related to chronic disease. Stable at baseline  CAD. No chest pain. EKG was normal sinus rhythm. Aspirin , since was not taking aspirin prior to arrival,cont simvastatin  History of DVT x1 (left),  Pt was on coumadin for 1 year per pt.  Coumadin but stopped by PCP when she started "spitting up blood"    DVT prophylaxis:   SCD Code Status: full  Family Communication: w/ patient, no family in room Disposition Plan: home hopefully tomorrow  Consults called:  [facility] neurology Admission status: obs     Lab Results  Component Value Date   PLT 228 09/02/2016     Anti-infectives    None        Objective:   Vitals:   09/03/16 0030 09/03/16 0230 09/03/16 0430 09/03/16 0630  BP: (!) 184/73 (!) 182/63 (!) 159/61 (!) 161/55  Pulse: 83 64 64 73  Resp: 19 18 18 18   Temp: 98.4 F (36.9 C) 98.4 F (36.9 C) 98.2 F (36.8 C) 98.2 F (36.8 C)  TempSrc: Oral Oral Oral Oral  SpO2: 98% 99% 98% 100%  Weight:      Height:        Wt Readings from Last 3 Encounters:  09/02/16 98.7 kg (217 lb 9.5 oz)  08/16/16 93.2 kg (205 lb 8 oz)  08/09/16 93.2 kg (205 lb 8 oz)     Intake/Output Summary (Last 24 hours) at 09/03/16 0821 Last data filed at 09/03/16 0500  Gross per 24 hour  Intake                0 ml  Output              700 ml  Net             -  700 ml     Physical Exam  Awake Alert, Oriented X 3, No new F.N deficits, Normal affect [place].AT,PERRAL Supple Neck,No JVD, No cervical lymphadenopathy appriciated.  Symmetrical Chest wall movement, Good air movement bilaterally, CTAB RRR,No Gallops,Rubs or new Murmurs, No Parasternal Heave +ve B.Sounds, Abd Soft, No tenderness, No organomegaly appriciated, No rebound - guarding or rigidity. No  Cyanosis, Clubbing or edema, No new Rash or bruise   5/5 strength in all 4 ext, right hand seems slightly clumbsy     Data Review:    CBC  Recent Labs Lab 09/02/16 1440 09/02/16 1450  WBC 9.1  --   HGB 11.8* 12.9  HCT 37.1 38.0  PLT 228  --   MCV 83.9  --   MCH 26.7  --   MCHC 31.8  --   RDW 15.1  --   LYMPHSABS 2.6  --   MONOABS 0.4  --   EOSABS 0.0  --   BASOSABS 0.0  --     Chemistries   Recent Labs Lab 09/02/16 1440 09/02/16 1450 09/03/16 0434  NA 138 141 137  K 3.5 3.5 3.5  CL 105 104 102  CO2 26  --  28  GLUCOSE 147* 148* 187*  BUN 19 22* 17  CREATININE 1.31* 1.40* 1.02*  CALCIUM 9.1  --  8.9  AST 17  --   --   ALT 12*  --   --   ALKPHOS 87  --   --   BILITOT 0.6  --   --    ------------------------------------------------------------------------------------------------------------------  Recent Labs  09/03/16 0434  CHOL 136  HDL 49  LDLCALC 75  TRIG 62  CHOLHDL 2.8    Lab Results  Component Value Date   HGBA1C 10.1 (H) 08/09/2016   ------------------------------------------------------------------------------------------------------------------ No results for input(s): TSH, T4TOTAL, T3FREE, THYROIDAB in the last 72 hours.  Invalid input(s): FREET3 ------------------------------------------------------------------------------------------------------------------ No results for input(s): VITAMINB12, FOLATE, FERRITIN, TIBC, IRON, RETICCTPCT in the last 72 hours.  Coagulation profile  Recent Labs Lab 09/02/16 1440  INR 0.95    No results for input(s): DDIMER in the last 72 hours.  Cardiac Enzymes No results for input(s): CKMB, TROPONINI, MYOGLOBIN in the last 168 hours.  Invalid input(s): CK ------------------------------------------------------------------------------------------------------------------ No results found for: BNP  Inpatient Medications  Scheduled Meds: . clopidogrel  75 mg Oral Daily  . potassium chloride   10 mEq Oral Daily  . simvastatin  20 mg Oral q1800   Continuous Infusions: PRN Meds:.acetaminophen **OR** [DISCONTINUED] acetaminophen (TYLENOL) oral liquid 160 mg/5 mL **OR** acetaminophen, alum & mag hydroxide-simeth, hydrALAZINE, senna-docusate  Micro Results No results found for this or any previous visit (from the past 240 hour(s)).  Radiology Reports Mr Brain Wo Contrast  Result Date: 09/02/2016 CLINICAL DATA:  Right arm weakness beginning this morning while washing clothes. EXAM: MRI HEAD WITHOUT CONTRAST MRA HEAD WITHOUT CONTRAST TECHNIQUE: Multiplanar, multiecho pulse sequences of the brain and surrounding structures were obtained without intravenous contrast. Angiographic images of the head were obtained using MRA technique without contrast. COMPARISON:  Brain MRI 02/29/2016 FINDINGS: MRI HEAD FINDINGS Brain: Acute infarct in the left cerebrum, affecting a small area perirolandic cortex, more extensive in the subjacent white matter. The infarct measures up to 18 mm on coronal acquisition. Punctate acute infarct seen in the right centrum semiovale. Chronic small vessel ischemia with patchy FLAIR hyperintensity in the cerebral white matter. Microvascular ischemic change also affects the pons and prominently in the bilateral thalamus. Small remote right superior  cerebellar infarcts. Numerous remote micro hemorrhages in the bilateral thalamus, brainstem, and cerebellum. These appear more extensive than prior, but this could be technical. Vascular: Arterial findings below. Normal dural venous sinus flow voids. Skull and upper cervical spine: Degenerative changes in the cervical spine with chronic patchy marrow hypointensity, stable. Sinuses/Orbits: Bilateral cataract resection.  No acute finding MRA HEAD FINDINGS Mildly motion degraded, but overall diagnostic. Symmetric carotid and vertebral arteries. No major branch occlusion is noted. There is a moderate narrowing at the right P1 2 junction which  could be accentuated by artifact. IMPRESSION: Brain MRI: 1. Acute left perirolandic infarct, up to 18 mm in size. Punctate acute infarct in the right centrum semiovale. 2. Chronic small vessel ischemia and remote micro hemorrhages with a hypertensive pattern. Intracranial MRA: 1. No acute finding. 2. Moderate to advanced right proximal PCA stenosis. Electronically Signed   By: Monte Fantasia M.D.   On: 09/02/2016 17:10   Mm Digital Screening Bilateral  Result Date: 08/18/2016 CLINICAL DATA:  Screening. EXAM: DIGITAL SCREENING BILATERAL MAMMOGRAM WITH CAD COMPARISON:  Previous exam(s). ACR Breast Density Category b: There are scattered areas of fibroglandular density. FINDINGS: There are no findings suspicious for malignancy. Images were processed with CAD. IMPRESSION: No mammographic evidence of malignancy. A result letter of this screening mammogram will be mailed directly to the patient. RECOMMENDATION: Screening mammogram in one year. (Code:SM-B-01Y) BI-RADS CATEGORY  1: Negative. Electronically Signed   By: Ammie Ferrier M.D.   On: 08/18/2016 11:42   Mr Jodene Nam Head/brain RK Cm  Result Date: 09/02/2016 CLINICAL DATA:  Right arm weakness beginning this morning while washing clothes. EXAM: MRI HEAD WITHOUT CONTRAST MRA HEAD WITHOUT CONTRAST TECHNIQUE: Multiplanar, multiecho pulse sequences of the brain and surrounding structures were obtained without intravenous contrast. Angiographic images of the head were obtained using MRA technique without contrast. COMPARISON:  Brain MRI 02/29/2016 FINDINGS: MRI HEAD FINDINGS Brain: Acute infarct in the left cerebrum, affecting a small area perirolandic cortex, more extensive in the subjacent white matter. The infarct measures up to 18 mm on coronal acquisition. Punctate acute infarct seen in the right centrum semiovale. Chronic small vessel ischemia with patchy FLAIR hyperintensity in the cerebral white matter. Microvascular ischemic change also affects the pons  and prominently in the bilateral thalamus. Small remote right superior cerebellar infarcts. Numerous remote micro hemorrhages in the bilateral thalamus, brainstem, and cerebellum. These appear more extensive than prior, but this could be technical. Vascular: Arterial findings below. Normal dural venous sinus flow voids. Skull and upper cervical spine: Degenerative changes in the cervical spine with chronic patchy marrow hypointensity, stable. Sinuses/Orbits: Bilateral cataract resection.  No acute finding MRA HEAD FINDINGS Mildly motion degraded, but overall diagnostic. Symmetric carotid and vertebral arteries. No major branch occlusion is noted. There is a moderate narrowing at the right P1 2 junction which could be accentuated by artifact. IMPRESSION: Brain MRI: 1. Acute left perirolandic infarct, up to 18 mm in size. Punctate acute infarct in the right centrum semiovale. 2. Chronic small vessel ischemia and remote micro hemorrhages with a hypertensive pattern. Intracranial MRA: 1. No acute finding. 2. Moderate to advanced right proximal PCA stenosis. Electronically Signed   By: Monte Fantasia M.D.   On: 09/02/2016 17:10   Ct Head Code Stroke W/o Cm  Addendum Date: 09/02/2016   ADDENDUM REPORT: 09/02/2016 14:42 ADDENDUM: These results were called by telephone at the time of interpretation on 09/02/2016 at 2:25 pm to Dr. Orlena Sheldon , who verbally acknowledged these results. Electronically Signed  By: Franchot Gallo M.D.   On: 09/02/2016 14:42   Result Date: 09/02/2016 CLINICAL DATA:  Code stroke.  Right arm weakness EXAM: CT HEAD WITHOUT CONTRAST TECHNIQUE: Contiguous axial images were obtained from the base of the skull through the vertex without intravenous contrast. COMPARISON:  CT 02/29/2016 FINDINGS: Brain: Ventricle size normal. Patchy white matter hypodensity similar to the prior study. Small chronic infarct in the right cerebellum. Negative for acute infarct. Negative for acute hemorrhage or mass.  Vascular: Negative for hyperdense vessel. Skull: Negative Sinuses/Orbits: Negative Other: None ASPECTS ([place] Stroke Program Early CT Score) - Ganglionic level infarction (caudate, lentiform nuclei, internal capsule, insula, M1-M3 cortex): 7 - Supraganglionic infarction (M4-M6 cortex): 3 Total score (0-10 with 10 being normal): 10 IMPRESSION: 1. No acute abnormality 2. ASPECTS is 10 Electronically Signed: By: Franchot Gallo M.D. On: 09/02/2016 14:23    Time Spent in minutes  30   Jani Gravel M.D on 09/03/2016 at 8:21 AM  Between 7am to 7pm - Pager - [phone]  After 7pm go to [email or web address] - password [email or web address]  [facility] -  Office  [phone]

## 2016-09-03 NOTE — Progress Notes (Signed)
STROKE TEAM PROGRESS NOTE   HISTORY OF PRESENT ILLNESS (per record) Kristen [name] is a 76 y.o. female LSW 10:30am but later developed  acute right arm and leg weakness and numbness. CT Brain negative.  She was taking ASA 81 mg qd at home.  She has a history of prior stroke and DM.    SUBJECTIVE (INTERVAL HISTORY) Her  nurse is at the bedside.     OBJECTIVE Temp:  [97.9 F (36.6 C)-98.7 F (37.1 C)] 98.4 F (36.9 C) (05/13 1430) Pulse Rate:  [57-88] 88 (05/13 1430) Cardiac Rhythm: Normal sinus rhythm (05/13 0700) Resp:  [14-19] 18 (05/13 1430) BP: (121-219)/(54-101) 121/54 (05/13 1430) SpO2:  [97 %-100 %] 99 % (05/13 1430)  CBC:   Recent Labs Lab 09/02/16 1440 09/02/16 1450  WBC 9.1  --   NEUTROABS 6.0  --   HGB 11.8* 12.9  HCT 37.1 38.0  MCV 83.9  --   PLT 228  --     Basic Metabolic Panel:   Recent Labs Lab 09/02/16 1440 09/02/16 1450 09/03/16 0434  NA 138 141 137  K 3.5 3.5 3.5  CL 105 104 102  CO2 26  --  28  GLUCOSE 147* 148* 187*  BUN 19 22* 17  CREATININE 1.31* 1.40* 1.02*  CALCIUM 9.1  --  8.9    Lipid Panel:     Component Value Date/Time   CHOL 136 09/03/2016 0434   CHOL 138 07/03/2013 0442   TRIG 62 09/03/2016 0434   TRIG 77 07/03/2013 0442   HDL 49 09/03/2016 0434   HDL 41 07/03/2013 0442   CHOLHDL 2.8 09/03/2016 0434   VLDL 12 09/03/2016 0434   VLDL 15 07/03/2013 0442   LDLCALC 75 09/03/2016 0434   LDLCALC 82 07/03/2013 0442   HgbA1c:  Lab Results  Component Value Date   HGBA1C 10.1 (H) 08/09/2016   Urine Drug Screen: No results found for: LABOPIA, COCAINSCRNUR, LABBENZ, AMPHETMU, THCU, LABBARB  Alcohol Level No results found for: [facility]   IMAGING  Mr Jodene Nam Head/brain Wo Cm 09/02/2016  MRI:  1. Acute left perirolandic infarct, up to 18 mm in size. Punctate acute infarct in the right centrum semiovale.  2. Chronic small vessel ischemia and remote micro hemorrhages with a hypertensive pattern.  MRA:  1. No acute finding.  2.  Moderate to advanced right proximal PCA stenosis.     Ct Head Code Stroke W/o Cm 09/02/2016 1. No acute abnormality  2. ASPECTS is 10     PHYSICAL EXAM Pleasant elderly lady not in distress. . Afebrile. Head is nontraumatic. Neck is supple without bruit.    Cardiac exam no murmur or gallop. Lungs are clear to auscultation. Distal pulses are well felt.  Neurological Exam :  Awake alert oriented x 3 normal speech and language. Mild left lower face asymmetry. Tongue midline. No drift. Mild diminished fine finger movements on right. Orbits left over right upper extremity. Mild right grip weak.. Normal sensation . Normal coordination.Gaitdeferred     ASSESSMENT/PLAN [name] is a 76 y.o. female with history of a prior stroke, diabetes mellitus, syncope, obstructive sleep apnea, coronary artery disease with MI, hypertension, hyperlipidemia, history of pulmonary embolism, diabetes mellitus, abdominal aortic aneurysm, and frequent headaches presenting with acute onset of right arm and leg weakness / numbness. She did not receive IV t-PA as the patient declined due to risk of hemorrhage.   Stroke: Bilateral infarcts. Likely small vessel diseaseResultant  Mild right hemiparesis  MRI - Acute left perirolandic infarct, up to 18 mm in size. Punctate acute infarct in the right centrum semiovale.   MRA - Moderate to advanced right proximal PCA stenosis.   Carotid Doppler - pending  2D Echo - pending  LDL - 75  HgbA1c pending  VTE prophylaxis - SCDs Diet Carb Modified Fluid consistency: Thin; Room service appropriate? Yes  No antithrombotic prior to admission, now on aspirin 325 mg daily  Patient counseled to be compliant with her antithrombotic medications  Ongoing aggressive stroke risk factor management  Therapy recommendations:  Home health PT. OT evaluation pending.  Disposition:  Pending  Hypertension  Stable  Permissive hypertension (OK if < 220/120) but  gradually normalize in 5-7 days  Long-term BP goal normotensive  Hyperlipidemia  Home meds:  Zocor 20 mg daily resumed in hospital  LDL 75, goal < 70  Increase Zocor to 40 mg daily  Continue statin at discharge  Diabetes  HgbA1c pending goal < 7.0  Unc / Controlled  Other Stroke Risk Factors  Advanced age  ETOH use, advised to drink no more than 1 drink per day  Obesity, Body mass index is 41.11 kg/m., recommend weight loss, diet and exercise as appropriate   Hx stroke/TIA  Coronary artery disease  Obstructive sleep apnea  Other Active Problems  Elevated creatinine - 1.31 -> 1.40 -> 1.02  Hospital day # 0  I have personally examined this patient, reviewed notes, independently viewed imaging studies, participated in medical decision making and plan of care.ROS completed by me personally and pertinent positives fully documented  I have made any additions or clarifications directly to the above note.  She presented with right sided weakness do to left some cortical infarct likely from small vessel disease. Recommend aspirin 325 mg daily and continue ongoing stroke evaluation. Long discussion at the bedside with and with Dr. Maudie Mercury and answered questions.Greater than 50% time during this 35 minute visit to was spent on counseling and coordination of care  Lacunar infarct, stroke evaluation and treatment discussion and answering questions.  Antony Contras, MD Medical Director [facility] Pager: [phone] 09/03/2016 5:11 PM   To contact Stroke Continuity provider, please refer to [email or web address] After hours, contact General Neurology

## 2016-09-04 ENCOUNTER — Inpatient Hospital Stay (HOSPITAL_COMMUNITY): Payer: Medicare Other

## 2016-09-04 DIAGNOSIS — N179 Acute kidney failure, unspecified: Secondary | ICD-10-CM

## 2016-09-04 DIAGNOSIS — I638 Other cerebral infarction: Secondary | ICD-10-CM

## 2016-09-04 LAB — ECHOCARDIOGRAM COMPLETE
Height: 61 in
WEIGHTICAEL: 3481.5 [oz_av]

## 2016-09-04 LAB — VAS US CAROTID
LCCADSYS: -89 cm/s
LCCAPDIAS: 14 cm/s
LEFT ECA DIAS: -20 cm/s
LEFT VERTEBRAL DIAS: 15 cm/s
Left CCA dist dias: -17 cm/s
Left CCA prox sys: 91 cm/s
Left ICA dist dias: -33 cm/s
Left ICA dist sys: -95 cm/s
Left ICA prox dias: 22 cm/s
Left ICA prox sys: 93 cm/s
RCCADSYS: -91 cm/s
RCCAPDIAS: -14 cm/s
RCCAPSYS: -108 cm/s
RIGHT ECA DIAS: -18 cm/s
RIGHT VERTEBRAL DIAS: 16 cm/s

## 2016-09-04 LAB — CBC
HCT: 33.7 % — ABNORMAL LOW (ref 36.0–46.0)
Hemoglobin: 10.6 g/dL — ABNORMAL LOW (ref 12.0–15.0)
MCH: 26.4 pg (ref 26.0–34.0)
MCHC: 31.5 g/dL (ref 30.0–36.0)
MCV: 84 fL (ref 78.0–100.0)
PLATELETS: 225 10*3/uL (ref 150–400)
RBC: 4.01 MIL/uL (ref 3.87–5.11)
RDW: 15.8 % — AB (ref 11.5–15.5)
WBC: 7.3 10*3/uL (ref 4.0–10.5)

## 2016-09-04 LAB — COMPREHENSIVE METABOLIC PANEL
ALT: 10 U/L — ABNORMAL LOW (ref 14–54)
ANION GAP: 10 (ref 5–15)
AST: 13 U/L — AB (ref 15–41)
Albumin: 2.9 g/dL — ABNORMAL LOW (ref 3.5–5.0)
Alkaline Phosphatase: 78 U/L (ref 38–126)
BUN: 26 mg/dL — ABNORMAL HIGH (ref 6–20)
CALCIUM: 8.8 mg/dL — AB (ref 8.9–10.3)
CO2: 27 mmol/L (ref 22–32)
Chloride: 103 mmol/L (ref 101–111)
Creatinine, Ser: 1.01 mg/dL — ABNORMAL HIGH (ref 0.44–1.00)
GFR calc Af Amer: >60 mL/min (ref >60)
GFR, EST NON AFRICAN AMERICAN: 53 mL/min — AB (ref >60)
Glucose, Bld: 149 mg/dL — ABNORMAL HIGH (ref 65–99)
POTASSIUM: 3.7 mmol/L (ref 3.5–5.1)
Sodium: 140 mmol/L (ref 135–145)
TOTAL PROTEIN: 6.3 g/dL — AB (ref 6.5–8.1)
Total Bilirubin: 0.6 mg/dL (ref 0.3–1.2)

## 2016-09-04 LAB — HEMOGLOBIN A1C
Hgb A1c MFr Bld: 9.3 % — ABNORMAL HIGH (ref 4.8–5.6)
MEAN PLASMA GLUCOSE: 220 mg/dL

## 2016-09-04 MED ORDER — IOPAMIDOL (ISOVUE-370) INJECTION 76%
50.0000 mL | Freq: Once | INTRAVENOUS | Status: AC | PRN
  Administered 2016-09-04: 50 mL via INTRAVENOUS

## 2016-09-04 MED ORDER — ATORVASTATIN CALCIUM 10 MG PO TABS
20.0000 mg | ORAL_TABLET | Freq: Every day | ORAL | Status: DC
  Administered 2016-09-05: 20 mg via ORAL
  Filled 2016-09-04: qty 2

## 2016-09-04 MED ORDER — AMLODIPINE BESYLATE 2.5 MG PO TABS
2.5000 mg | ORAL_TABLET | Freq: Every day | ORAL | Status: DC
  Administered 2016-09-04 – 2016-09-05 (×2): 2.5 mg via ORAL
  Filled 2016-09-04 (×2): qty 1

## 2016-09-04 MED ORDER — LOSARTAN POTASSIUM 50 MG PO TABS
50.0000 mg | ORAL_TABLET | Freq: Every day | ORAL | Status: DC
  Administered 2016-09-04: 50 mg via ORAL
  Filled 2016-09-04: qty 1

## 2016-09-04 MED ORDER — LOSARTAN POTASSIUM 50 MG PO TABS
100.0000 mg | ORAL_TABLET | Freq: Every day | ORAL | Status: DC
  Administered 2016-09-05: 100 mg via ORAL
  Filled 2016-09-04: qty 2

## 2016-09-04 MED ORDER — PIOGLITAZONE HCL 30 MG PO TABS
30.0000 mg | ORAL_TABLET | Freq: Every day | ORAL | Status: DC
  Administered 2016-09-04 – 2016-09-05 (×2): 30 mg via ORAL
  Filled 2016-09-04 (×2): qty 1

## 2016-09-04 MED ORDER — GLIMEPIRIDE 4 MG PO TABS
4.0000 mg | ORAL_TABLET | Freq: Every day | ORAL | Status: DC
  Administered 2016-09-05: 4 mg via ORAL
  Filled 2016-09-04: qty 1

## 2016-09-04 NOTE — Progress Notes (Signed)
STROKE TEAM PROGRESS NOTE   HISTORY OF PRESENT ILLNESS (per record) Kristen [name] is a 76 y.o. female LSW 10:30am but later developed  acute right arm and leg weakness and numbness. CT Brain negative.  She was taking ASA 81 mg qd at home.  She has a history of prior stroke and DM.    SUBJECTIVE (INTERVAL HISTORY) Her  daughter is at the bedside.  She has remained stable without recurrent symptoms. Patient had technical difficulty with carotid ultrasound and left side could not be adequately visualized due to tortuosity. Plan is to obtain CT angiogram of the neck.   OBJECTIVE Temp:  [98 F (36.7 C)-98.4 F (36.9 C)] 98 F (36.7 C) (05/14 0900) Pulse Rate:  [65-81] 67 (05/14 0900) Cardiac Rhythm: Sinus bradycardia (05/14 0700) Resp:  [16-17] 16 (05/14 0900) BP: (138-171)/(47-81) 158/55 (05/14 0900) SpO2:  [97 %-100 %] 100 % (05/14 0900)  CBC:   Recent Labs Lab 09/02/16 1440 09/02/16 1450 09/04/16 0531  WBC 9.1  --  7.3  NEUTROABS 6.0  --   --   HGB 11.8* 12.9 10.6*  HCT 37.1 38.0 33.7*  MCV 83.9  --  84.0  PLT 228  --  557    Basic Metabolic Panel:   Recent Labs Lab 09/03/16 0434 09/04/16 0531  NA 137 140  K 3.5 3.7  CL 102 103  CO2 28 27  GLUCOSE 187* 149*  BUN 17 26*  CREATININE 1.02* 1.01*  CALCIUM 8.9 8.8*    Lipid Panel:     Component Value Date/Time   CHOL 136 09/03/2016 0434   CHOL 138 07/03/2013 0442   TRIG 62 09/03/2016 0434   TRIG 77 07/03/2013 0442   HDL 49 09/03/2016 0434   HDL 41 07/03/2013 0442   CHOLHDL 2.8 09/03/2016 0434   VLDL 12 09/03/2016 0434   VLDL 15 07/03/2013 0442   LDLCALC 75 09/03/2016 0434   LDLCALC 82 07/03/2013 0442   HgbA1c:  Lab Results  Component Value Date   HGBA1C 9.3 (H) 09/03/2016   Urine Drug Screen: No results found for: LABOPIA, COCAINSCRNUR, LABBENZ, AMPHETMU, THCU, LABBARB  Alcohol Level No results found for: [facility]   IMAGING  Mr Jodene Nam Head/brain Wo Cm 09/02/2016  MRI:  1. Acute left perirolandic  infarct, up to 18 mm in size. Punctate acute infarct in the right centrum semiovale.  2. Chronic small vessel ischemia and remote micro hemorrhages with a hypertensive pattern.  MRA:  1. No acute finding.  2. Moderate to advanced right proximal PCA stenosis.     Ct Head Code Stroke W/o Cm 09/02/2016 1. No acute abnormality  2. ASPECTS is 10     PHYSICAL EXAM Pleasant elderly lady not in distress. . Afebrile. Head is nontraumatic. Neck is supple without bruit.    Cardiac exam no murmur or gallop. Lungs are clear to auscultation. Distal pulses are well felt.  Neurological Exam :  Awake alert oriented x 3 normal speech and language. Mild left lower face asymmetry. Tongue midline. No drift. Mild diminished fine finger movements on right. Orbits left over right upper extremity. Mild right grip weak.. Normal sensation . Normal coordination.Gaitdeferred     ASSESSMENT/PLAN [name] is a 76 y.o. female with history of a prior stroke, diabetes mellitus, syncope, obstructive sleep apnea, coronary artery disease with MI, hypertension, hyperlipidemia, history of pulmonary embolism, diabetes mellitus, abdominal aortic aneurysm, and frequent headaches presenting with acute onset of right arm and leg weakness / numbness. She  did not receive IV t-PA as the patient declined due to risk of hemorrhage.   Stroke: Bilateral infarcts. Likely small vessel disease  Resultant  Mild right hemiparesis  MRI - Acute left perirolandic infarct, up to 18 mm in size. Punctate acute infarct in the right centrum semiovale.   MRA - Moderate to advanced right proximal PCA stenosis.   Carotid Doppler - pending  2D Echo - pending  LDL - 75  HgbA1c 9.3  VTE prophylaxis - SCDs Diet Carb Modified Fluid consistency: Thin; Room service appropriate? Yes  No antithrombotic prior to admission, now on aspirin 325 mg daily  Patient counseled to be compliant with her antithrombotic medications  Ongoing  aggressive stroke risk factor management  Therapy recommendations:  Home health PT. OT evaluation pending.  Disposition:  Pending  Hypertension  Stable  Permissive hypertension (OK if < 220/120) but gradually normalize in 5-7 days  Long-term BP goal normotensive  Hyperlipidemia  Home meds:  Zocor 20 mg daily resumed in hospital  LDL 75, goal < 70  Increase Zocor to 40 mg daily  Continue statin at discharge  Diabetes  HgbA1c 9.3 goal < 7.0  Unc / Controlled  Other Stroke Risk Factors  Advanced age  ETOH use, advised to drink no more than 1 drink per day  Obesity, Body mass index is 41.11 kg/m., recommend weight loss, diet and exercise as appropriate   Hx stroke/TIA  Coronary artery disease  Obstructive sleep apnea  Other Active Problems  Elevated creatinine - 1.31 -> 1.40 -> 1.02  Hospital day # 1  I have personally examined this patient, reviewed notes, independently viewed imaging studies, participated in medical decision making and plan of care.ROS completed by me personally and pertinent positives fully documented  I have made any additions or clarifications directly to the above note.  She presented with right sided weakness due to left some cortical infarct likely from small vessel disease. Check Ct angio neck Continue aspirin 325 mg daily and continue ongoing stroke evaluation. Long discussion at the bedside with and with daughter and  Dr. Maudie Mercury and answered questions.Greater than 50% time during this 25 minute visit to was spent on counseling and coordination of care  Lacunar infarct, stroke evaluation and treatment discussion and answering questions.  Antony Contras, MD Medical Director [facility] Stroke Center Pager: [phone] 09/04/2016 2:39 PM   To contact Stroke Continuity provider, please refer to [email or web address] After hours, contact General Neurology

## 2016-09-04 NOTE — Progress Notes (Signed)
Patients on amlodipine and simvastatin > 20mg  have increased risk of rhabdomyolysis. Per protocol will switch simvastatin 40mg  to atorvastatin 20mg  daily starting tomorrow. If questions please call pharmacy.  Elenor Quinones, PharmD, BCPS Clinical Pharmacist Pager [phone] 09/04/2016 7:38 PM

## 2016-09-04 NOTE — Progress Notes (Signed)
  Echocardiogram 2D Echocardiogram has been performed.  Donata Clay 09/04/2016, 10:56 AM

## 2016-09-04 NOTE — Progress Notes (Signed)
Physical Therapy Treatment Patient Details Name: Kristen [name] MRN: [id number] DOB: February 20, 1941 Today's Date: 09/04/2016    History of Present Illness Kristen [name] is a very pleasant 76 y.o. female with medical history significant for CVA, diabetes, vertigo, coronary artery disease, abdominal aortic atherosclerosis, gout, hyperlipidemia, hypertension, obesity, DVT and PE presents to emergency department with the chief complaint of right arm weakness.  MRI shows Acute left perirolandic infarct, up to 18 mm in size. Punctate acute infarct in the right centrum semiovale.    PT Comments    Pt limited this session by gout pain in R foot. Pt declined attempting any mobility out of the chair due to pain but was willing to participate in therapeutic exercise. Overall good rehab effort with exercise this session. Will continue to follow and progress as able per POC.    Follow Up Recommendations  Home health PT;Supervision - Intermittent     Equipment Recommendations  None recommended by PT    Recommendations for Other Services       Precautions / Restrictions Precautions Precautions: Fall Restrictions Weight Bearing Restrictions: No    Mobility  Bed Mobility               General bed mobility comments: Pt received sitting up in recliner upon PT arrival.   Transfers                 General transfer comment: Pt declined mobility this session due to gout pain in R foot. States it is extremely painful to put weight through it at this time.   Ambulation/Gait                 Stairs            Wheelchair Mobility    Modified Rankin (Stroke Patients Only) Modified Rankin (Stroke Patients Only) Pre-Morbid Rankin Score: No symptoms Modified Rankin: Moderately severe disability     Balance Overall balance assessment: Needs assistance Sitting-balance support: No upper extremity supported;Feet unsupported Sitting balance-Leahy Scale: Fair                                       Cognition Arousal/Alertness: Awake/alert Behavior During Therapy: WFL for tasks assessed/performed Overall Cognitive Status: Within Functional Limits for tasks assessed                                        Exercises General Exercises - Upper Extremity Elbow Flexion: AAROM;10 reps;Seated General Exercises - Lower Extremity Ankle Circles/Pumps: 10 reps;Both Quad Sets: 10 reps;Both Gluteal Sets: 10 reps;Both Long Arc Quad: 10 reps;Both Hip ABduction/ADduction: 10 reps;AAROM;Right;AROM;Left Hip Flexion/Marching: 10 reps;Both Other Exercises Other Exercises: 10 grip squeezes    General Comments        Pertinent Vitals/Pain Pain Assessment: No/denies pain Faces Pain Scale: Hurts even more Pain Location: R foot Pain Descriptors / Indicators: Guarding;Sharp (Gout) Pain Intervention(s): Limited activity within patient's tolerance;Monitored during session;Repositioned    Home Living                      Prior Function            PT Goals (current goals can now be found in the care plan section) Acute Rehab PT Goals Patient Stated Goal: back home PT Goal Formulation: With patient  Time For Goal Achievement: 09/16/16 Potential to Achieve Goals: Good Progress towards PT goals: Progressing toward goals    Frequency    Min 3X/week      PT Plan Current plan remains appropriate    Co-evaluation              AM-PAC PT "6 Clicks" Daily Activity  Outcome Measure  Difficulty turning over in bed (including adjusting bedclothes, sheets and blankets)?: A Little Difficulty moving from lying on back to sitting on the side of the bed? : A Little Difficulty sitting down on and standing up from a chair with arms (e.g., wheelchair, bedside commode, etc,.)?: Total Help needed moving to and from a bed to chair (including a wheelchair)?: A Lot Help needed walking in hospital room?: A Lot Help needed climbing 3-5  steps with a railing? : A Lot 6 Click Score: 13    End of Session   Activity Tolerance: Patient tolerated treatment well Patient left: in chair;with call bell/phone within reach;with chair alarm set Nurse Communication: Precautions (gout pain) PT Visit Diagnosis: Unsteadiness on feet (R26.81);Muscle weakness (generalized) (M62.81)     Time: 9244-6286 PT Time Calculation (min) (ACUTE ONLY): 26 min  Charges:  $Therapeutic Exercise: 23-37 mins                    G Codes:       Kristen [name], PT, DPT Acute Rehabilitation Services Pager: [phone]    Kristen [name] 09/04/2016, 12:38 PM

## 2016-09-04 NOTE — Progress Notes (Signed)
Occupational Therapy Treatment Patient Details Name: Kristen [name] MRN: [id number] DOB: July 01, 1940 Today's Date: 09/04/2016    History of present illness Kristen [name] is a very pleasant 76 y.o. female with medical history significant for CVA, diabetes, vertigo, coronary artery disease, abdominal aortic atherosclerosis, gout, hyperlipidemia, hypertension, obesity, DVT and PE presents to emergency department with the chief complaint of right arm weakness.  MRI shows Acute left perirolandic infarct, up to 18 mm in size. Punctate acute infarct in the right centrum semiovale.   OT comments  Pt progressing towards goals, functional mobility is limited due to pain in R foot, with Pt only able to complete stand pivot transfer to chair with MinA. Pt completed seated ADLs with setup. Completed RUE HEP and educated Pt in RUE self ROM with Pt verbalizing/demonstrating understanding. Educated/encouraged Pt continue to use RUE during ADL tasks including eating and grooming ADLs to increase strength and AROM. POC remains appropriate. Will continue to follow acutely.      Follow Up Recommendations  Home health OT;Supervision/Assistance - 24 hour    Equipment Recommendations  3 in 1 bedside commode          Precautions / Restrictions         Mobility Bed Mobility Overal bed mobility: Needs Assistance Bed Mobility: Supine to Sit     Supine to sit: Supervision        Transfers Overall transfer level: Needs assistance Equipment used: 1 person hand held assist Transfers: Sit to/from Stand;Stand Pivot Transfers Sit to Stand: Min guard Stand pivot transfers: Min assist       General transfer comment: Pt completed stand pivot transfer to recliner, declining further mobility due to gout pain in R foot. States it is extremely painful to put weight through it at this time.     Balance Overall balance assessment: Needs assistance Sitting-balance support: Feet supported;No upper extremity  supported Sitting balance-Leahy Scale: Fair     Standing balance support: Single extremity supported Standing balance-Leahy Scale: Poor Standing balance comment: requires UE support to maintain balance                           ADL either performed or assessed with clinical judgement   ADL Overall ADL's : Needs assistance/impaired Eating/Feeding: Set up;Sitting Eating/Feeding Details (indicate cue type and reason): assist to open containers; educated on and encouraged contined use of RUE to hold utensils during task to increase strength/coordination,  Grooming: Wash/dry hands;Wash/dry face;Sitting;Set up                   Toilet Transfer: Minimal assistance;Stand-pivot;BSC             General ADL Comments: Pt continues to experience pain in RLE and unable to bear weight, completed stand pivot to recliner during session for seated ADLs, seated UE exercises                        Cognition Arousal/Alertness: Awake/alert Behavior During Therapy: WFL for tasks assessed/performed Overall Cognitive Status: Within Functional Limits for tasks assessed                                          Exercises Shoulder Exercises Shoulder Flexion: Self ROM;Right;5 reps;Seated Shoulder ABduction: Self ROM;Right;5 reps;Seated Elbow Flexion: AAROM;Right;10 reps;Seated Elbow Extension: AAROM;Right;10 reps;Seated Wrist Flexion: AROM;5  reps;Right;Seated Wrist Extension: AROM;5 reps;Seated;Right Digit Composite Flexion: AROM;10 reps;Right;Seated Composite Extension: AROM;Right;10 reps;Seated Hand Exercises Forearm Supination: AROM;10 reps;Right;Seated Forearm Pronation: AROM;10 reps;Right;Seated          General Comments      Pertinent Vitals/ Pain       Faces Pain Scale: Hurts little more Pain Location: R foot Pain Descriptors / Indicators: Guarding;Sharp (gout ) Pain Intervention(s): Limited activity within patient's tolerance;Monitored  during session;Repositioned                                                          Frequency  Min 3X/week        Progress Toward Goals  OT Goals(current goals can now be found in the care plan section)  Progress towards OT goals: Progressing toward goals  Acute Rehab OT Goals Patient Stated Goal: back home OT Goal Formulation: With patient Time For Goal Achievement: 09/10/16 Potential to Achieve Goals: Good  Plan Discharge plan remains appropriate                     AM-PAC PT "6 Clicks" Daily Activity     Outcome Measure   Help from another person eating meals?: A Little Help from another person taking care of personal grooming?: A Little Help from another person toileting, which includes using toliet, bedpan, or urinal?: A Little Help from another person bathing (including washing, rinsing, drying)?: A Lot Help from another person to put on and taking off regular upper body clothing?: A Little Help from another person to put on and taking off regular lower body clothing?: A Lot 6 Click Score: 16    End of Session Equipment Utilized During Treatment: Gait belt  OT Visit Diagnosis: Unsteadiness on feet (R26.81);Pain;Hemiplegia and hemiparesis;Muscle weakness (generalized) (M62.81);Other abnormalities of gait and mobility (R26.89) Hemiplegia - Right/Left: Right Hemiplegia - dominant/non-dominant: Dominant Pain - Right/Left: Right Pain - part of body: Ankle and joints of foot   Activity Tolerance Patient tolerated treatment well   Patient Left in chair;with call bell/phone within reach             Time: 7673-4193 OT Time Calculation (min): 29 min  Charges: OT General Charges $OT Visit: 1 Procedure OT Treatments $Self Care/Home Management : 8-22 mins $Therapeutic Exercise: 8-22 mins  Lou Cal, OT Pager [phone] 09/04/2016    Raymondo Band 09/04/2016, 2:21 PM

## 2016-09-04 NOTE — Progress Notes (Addendum)
Patient ID: Kristen [name], female   DOB: Aug 28, 1940, 76 y.o.   MRN: [id number]                                                                PROGRESS NOTE                                                                                                                                                                                                             Patient Demographics:    Kristen [name], is a 76 y.o. female, DOB - 08/02/1940, OIN:[id number]  Admit date - 09/02/2016   Admitting Physician Elwin Mocha, MD  Outpatient Primary MD for the patient is [name], MD  LOS - 1  Outpatient Specialists:   Chief Complaint  Patient presents with  . Code Stroke  . Neck Pain       Brief Narrative    76 y.o.femalewith medical history significant for CVA, diabetes, vertigo, coronary artery disease,abdominal aortic atherosclerosis, gout, hyperlipidemia, hypertension, obesity, DVT and PE presents toemergency department with the chief complaint of right arm weakness. Initial evaluation concerning for stroke.  Information is obtained from the patient. She reports being in her usual state of health until around 10:30 AM she developed some "tightness" into her right shoulder and neck muscles. She states that it felt like she was trying to raise her right arm and someone was holding her shoulder down. She states she had "no control" of her right arm. She reports she was unable to move the arm above waist high without the assistance of left arm. She states symptoms were reminiscent of her previous stroke. She denies headache visual disturbances numbness tingling of extremities. She denies chest pain palpitations shortness of breath. She does report an unsteady gait and that right leg was "going in a different direction". She denies abdominal pain nausea vomiting diarrhea constipation melena. She denies dysuria hematuria frequency or urgency. She denies fever chills or sick contacts or recent  travel.  ED Course:In the emergency department she's afebrile hypertensive not hypoxic. She is evaluated by neurology   Subjective:    Elisabeth Cara today has been feeling   Stronger  in No headache, No chest pain, No abdominal pain - No Nausea, No new weakness tingling or numbness, No Cough - SOB.  Assessment  & Plan :    Principal Problem:   Right arm weakness Active Problems:   Hypertension, uncontrolled   History of DVT (deep vein thrombosis)   HLD (hyperlipidemia)   Obesity, Class II, BMI 35-39.9, with comorbidity (HCC)   Diabetes type 2, uncontrolled (HCC)   Benign paroxysmal positional vertigo   CAD (coronary artery disease)/coronary calcifications   Anemia   History of CVA (cerebrovascular accident)   Acute kidney injury ([place])   Stroke syndrome ([place])   Stroke (cerebrum) (HCC)    Right arm weakness/stroke  CT brain 5/12 =>negative. Pt declined TPA MRI brain 5/12=>  1. Acute left perirolandic infarct, up to 18 mm in size. Punctate acute infarct in the right centrum semiovale. 2. Chronic small vessel ischemia and remote micro hemorrhages  Carotid ultrasound 5/13=> negative for signifcant stenosis Cardiac echo 5/14=> EF 55-65%  she was not on any aspirin prior to CVA Cont Aspirin Increase simvastatin 40mg  po qhs   Acute kidney injury. Likely related to decreased oral intake in the setting of hydrochlorothiazide and ARB. Creatinine 1.40 on admission. Chart review indicates creatinine within the limits of normal in November 2017. cmp in am  Hypertension. Blood pressure the high end of normal in the emergency department. Home medications include losartan and hydrochlorothiazide. Neurology recommending permissive hypertension for 24 hours Start on losartan 50mg  po qday.  Will increase to 100mg  po qday tomorrow.  Start amlodipine 2.5mg  po qday today  Diabetes type 2. Home medications include oral agents. Restart actos 30mg  po qday, start amaryl 4mg  o  qday start victoza upon discharge as has cardiovascular data, reduction of stroke SSI  Hyperlipidemia. Increase simvastatin  Anemia. Likely related to chronic disease. Stable at baseline  CAD. No chest pain. EKG was normal sinus rhythm. Aspirin , since was not taking aspirin prior to arrival,cont simvastatin  History of DVT x1 (left),  Pt was on coumadin for 1 year per pt.  Coumadin but stopped by PCP when she started "spitting up blood"    DVT prophylaxis:  SCD Code Status:full Family Communication:w/ patient, no family in room Disposition Plan:home hopefully tomorrow Consults called:neurology Admission status:inpatient       Lab Results  Component Value Date   PLT 225 09/04/2016    Antibiotics  :    Anti-infectives    None        Objective:   Vitals:   09/03/16 1741 09/03/16 2102 09/04/16 0045 09/04/16 0453  BP: (!) 154/68 (!) 138/47 (!) 171/75 (!) 157/81  Pulse: 78 81 65 71  Resp: 16 17 16 17   Temp: 98.4 F (36.9 C) 98.4 F (36.9 C) 98.4 F (36.9 C) 98.2 F (36.8 C)  TempSrc: Oral Oral Oral Oral  SpO2: 99% 98% 97% 98%  Weight:      Height:        Wt Readings from Last 3 Encounters:  09/02/16 98.7 kg (217 lb 9.5 oz)  08/16/16 93.2 kg (205 lb 8 oz)  08/09/16 93.2 kg (205 lb 8 oz)     Intake/Output Summary (Last 24 hours) at 09/04/16 0840 Last data filed at 09/03/16 1007  Gross per 24 hour  Intake              240 ml  Output                0 ml  Net              240 ml     Physical Exam  Awake Alert, Oriented X 3, No new F.N deficits, Normal affect [place].AT,PERRAL Supple Neck,No JVD, No cervical lymphadenopathy appriciated.  Symmetrical Chest wall movement, Good air movement bilaterally, CTAB RRR,No Gallops,Rubs or new Murmurs, No Parasternal Heave +ve B.Sounds, Abd Soft, No tenderness, No organomegaly appriciated, No rebound - guarding or rigidity. No Cyanosis, Clubbing or edema, No new Rash or bruise   5-/5 grip in  the right hand,     Data Review:    CBC  Recent Labs Lab 09/02/16 1440 09/02/16 1450 09/04/16 0531  WBC 9.1  --  7.3  HGB 11.8* 12.9 10.6*  HCT 37.1 38.0 33.7*  PLT 228  --  225  MCV 83.9  --  84.0  MCH 26.7  --  26.4  MCHC 31.8  --  31.5  RDW 15.1  --  15.8*  LYMPHSABS 2.6  --   --   MONOABS 0.4  --   --   EOSABS 0.0  --   --   BASOSABS 0.0  --   --     Chemistries   Recent Labs Lab 09/02/16 1440 09/02/16 1450 09/03/16 0434 09/04/16 0531  NA 138 141 137 140  K 3.5 3.5 3.5 3.7  CL 105 104 102 103  CO2 26  --  28 27  GLUCOSE 147* 148* 187* 149*  BUN 19 22* 17 26*  CREATININE 1.31* 1.40* 1.02* 1.01*  CALCIUM 9.1  --  8.9 8.8*  AST 17  --   --  13*  ALT 12*  --   --  10*  ALKPHOS 87  --   --  78  BILITOT 0.6  --   --  0.6   ------------------------------------------------------------------------------------------------------------------  Recent Labs  09/03/16 0434  CHOL 136  HDL 49  LDLCALC 75  TRIG 62  CHOLHDL 2.8    Lab Results  Component Value Date   HGBA1C 10.1 (H) 08/09/2016   ------------------------------------------------------------------------------------------------------------------ No results for input(s): TSH, T4TOTAL, T3FREE, THYROIDAB in the last 72 hours.  Invalid input(s): FREET3 ------------------------------------------------------------------------------------------------------------------ No results for input(s): VITAMINB12, FOLATE, FERRITIN, TIBC, IRON, RETICCTPCT in the last 72 hours.  Coagulation profile  Recent Labs Lab 09/02/16 1440  INR 0.95    No results for input(s): DDIMER in the last 72 hours.  Cardiac Enzymes No results for input(s): CKMB, TROPONINI, MYOGLOBIN in the last 168 hours.  Invalid input(s): CK ------------------------------------------------------------------------------------------------------------------ No results found for: BNP  Inpatient Medications  Scheduled Meds: . aspirin EC   325 mg Oral Daily  . colchicine  0.6 mg Oral Daily  . feeding supplement (PRO-STAT SUGAR FREE 64)  30 mL Oral BID  . potassium chloride  10 mEq Oral Daily  . simvastatin  40 mg Oral q1800   Continuous Infusions: PRN Meds:.acetaminophen **OR** [DISCONTINUED] acetaminophen (TYLENOL) oral liquid 160 mg/5 mL **OR** acetaminophen, alum & mag hydroxide-simeth, hydrALAZINE, senna-docusate, traMADol  Micro Results No results found for this or any previous visit (from the past 240 hour(s)).  Radiology Reports Mr Brain Wo Contrast  Result Date: 09/02/2016 CLINICAL DATA:  Right arm weakness beginning this morning while washing clothes. EXAM: MRI HEAD WITHOUT CONTRAST MRA HEAD WITHOUT CONTRAST TECHNIQUE: Multiplanar, multiecho pulse sequences of the brain and surrounding structures were obtained without intravenous contrast. Angiographic images of the head were obtained using MRA technique without contrast. COMPARISON:  Brain MRI 02/29/2016 FINDINGS: MRI HEAD FINDINGS Brain: Acute infarct in the left cerebrum, affecting a small area perirolandic cortex, more extensive in the subjacent white matter. The infarct measures up  to 18 mm on coronal acquisition. Punctate acute infarct seen in the right centrum semiovale. Chronic small vessel ischemia with patchy FLAIR hyperintensity in the cerebral white matter. Microvascular ischemic change also affects the pons and prominently in the bilateral thalamus. Small remote right superior cerebellar infarcts. Numerous remote micro hemorrhages in the bilateral thalamus, brainstem, and cerebellum. These appear more extensive than prior, but this could be technical. Vascular: Arterial findings below. Normal dural venous sinus flow voids. Skull and upper cervical spine: Degenerative changes in the cervical spine with chronic patchy marrow hypointensity, stable. Sinuses/Orbits: Bilateral cataract resection.  No acute finding MRA HEAD FINDINGS Mildly motion degraded, but overall  diagnostic. Symmetric carotid and vertebral arteries. No major branch occlusion is noted. There is a moderate narrowing at the right P1 2 junction which could be accentuated by artifact. IMPRESSION: Brain MRI: 1. Acute left perirolandic infarct, up to 18 mm in size. Punctate acute infarct in the right centrum semiovale. 2. Chronic small vessel ischemia and remote micro hemorrhages with a hypertensive pattern. Intracranial MRA: 1. No acute finding. 2. Moderate to advanced right proximal PCA stenosis. Electronically Signed   By: Monte Fantasia M.D.   On: 09/02/2016 17:10   Dg Foot 2 Views Right  Result Date: 09/03/2016 CLINICAL DATA:  Right foot pain without trauma EXAM: RIGHT FOOT - 2 VIEW COMPARISON:  None. FINDINGS: The bones are osteopenic. No fracture or dislocation. Joint spaces are preserved. There is a single cannulated lag screw traversing the talocalcaneal joint. No abnormal lucency. IMPRESSION: Osteopenia without acute osseous abnormality. Electronically Signed   By: Ulyses Jarred M.D.   On: 09/03/2016 15:57   Mm Digital Screening Bilateral  Result Date: 08/18/2016 CLINICAL DATA:  Screening. EXAM: DIGITAL SCREENING BILATERAL MAMMOGRAM WITH CAD COMPARISON:  Previous exam(s). ACR Breast Density Category b: There are scattered areas of fibroglandular density. FINDINGS: There are no findings suspicious for malignancy. Images were processed with CAD. IMPRESSION: No mammographic evidence of malignancy. A result letter of this screening mammogram will be mailed directly to the patient. RECOMMENDATION: Screening mammogram in one year. (Code:SM-B-01Y) BI-RADS CATEGORY  1: Negative. Electronically Signed   By: Ammie Ferrier M.D.   On: 08/18/2016 11:42   Mr Jodene Nam Head/brain LG Cm  Result Date: 09/02/2016 CLINICAL DATA:  Right arm weakness beginning this morning while washing clothes. EXAM: MRI HEAD WITHOUT CONTRAST MRA HEAD WITHOUT CONTRAST TECHNIQUE: Multiplanar, multiecho pulse sequences of the brain  and surrounding structures were obtained without intravenous contrast. Angiographic images of the head were obtained using MRA technique without contrast. COMPARISON:  Brain MRI 02/29/2016 FINDINGS: MRI HEAD FINDINGS Brain: Acute infarct in the left cerebrum, affecting a small area perirolandic cortex, more extensive in the subjacent white matter. The infarct measures up to 18 mm on coronal acquisition. Punctate acute infarct seen in the right centrum semiovale. Chronic small vessel ischemia with patchy FLAIR hyperintensity in the cerebral white matter. Microvascular ischemic change also affects the pons and prominently in the bilateral thalamus. Small remote right superior cerebellar infarcts. Numerous remote micro hemorrhages in the bilateral thalamus, brainstem, and cerebellum. These appear more extensive than prior, but this could be technical. Vascular: Arterial findings below. Normal dural venous sinus flow voids. Skull and upper cervical spine: Degenerative changes in the cervical spine with chronic patchy marrow hypointensity, stable. Sinuses/Orbits: Bilateral cataract resection.  No acute finding MRA HEAD FINDINGS Mildly motion degraded, but overall diagnostic. Symmetric carotid and vertebral arteries. No major branch occlusion is noted. There is a moderate narrowing at  the right P1 2 junction which could be accentuated by artifact. IMPRESSION: Brain MRI: 1. Acute left perirolandic infarct, up to 18 mm in size. Punctate acute infarct in the right centrum semiovale. 2. Chronic small vessel ischemia and remote micro hemorrhages with a hypertensive pattern. Intracranial MRA: 1. No acute finding. 2. Moderate to advanced right proximal PCA stenosis. Electronically Signed   By: Monte Fantasia M.D.   On: 09/02/2016 17:10   Ct Head Code Stroke W/o Cm  Addendum Date: 09/02/2016   ADDENDUM REPORT: 09/02/2016 14:42 ADDENDUM: These results were called by telephone at the time of interpretation on 09/02/2016 at 2:25  pm to Dr. Orlena Sheldon , who verbally acknowledged these results. Electronically Signed   By: Franchot Gallo M.D.   On: 09/02/2016 14:42   Result Date: 09/02/2016 CLINICAL DATA:  Code stroke.  Right arm weakness EXAM: CT HEAD WITHOUT CONTRAST TECHNIQUE: Contiguous axial images were obtained from the base of the skull through the vertex without intravenous contrast. COMPARISON:  CT 02/29/2016 FINDINGS: Brain: Ventricle size normal. Patchy white matter hypodensity similar to the prior study. Small chronic infarct in the right cerebellum. Negative for acute infarct. Negative for acute hemorrhage or mass. Vascular: Negative for hyperdense vessel. Skull: Negative Sinuses/Orbits: Negative Other: None ASPECTS ([place] Stroke Program Early CT Score) - Ganglionic level infarction (caudate, lentiform nuclei, internal capsule, insula, M1-M3 cortex): 7 - Supraganglionic infarction (M4-M6 cortex): 3 Total score (0-10 with 10 being normal): 10 IMPRESSION: 1. No acute abnormality 2. ASPECTS is 10 Electronically Signed: By: Franchot Gallo M.D. On: 09/02/2016 14:23    Time Spent in minutes  30   Jani Gravel M.D on 09/04/2016 at 8:40 AM  Between 7am to 7pm - Pager - [phone]  After 7pm go to [email or web address] - password [facility]  [facility] -  Office  [phone]

## 2016-09-05 DIAGNOSIS — I638 Other cerebral infarction: Secondary | ICD-10-CM

## 2016-09-05 LAB — COMPREHENSIVE METABOLIC PANEL
ALT: 8 U/L — ABNORMAL LOW (ref 14–54)
AST: 15 U/L (ref 15–41)
Albumin: 3.2 g/dL — ABNORMAL LOW (ref 3.5–5.0)
Alkaline Phosphatase: 82 U/L (ref 38–126)
Anion gap: 11 (ref 5–15)
BUN: 25 mg/dL — AB (ref 6–20)
CHLORIDE: 105 mmol/L (ref 101–111)
CO2: 22 mmol/L (ref 22–32)
CREATININE: 0.92 mg/dL (ref 0.44–1.00)
Calcium: 9 mg/dL (ref 8.9–10.3)
GFR calc non Af Amer: 59 mL/min — ABNORMAL LOW (ref >60)
Glucose, Bld: 133 mg/dL — ABNORMAL HIGH (ref 65–99)
Potassium: 4.1 mmol/L (ref 3.5–5.1)
SODIUM: 138 mmol/L (ref 135–145)
Total Bilirubin: 0.7 mg/dL (ref 0.3–1.2)
Total Protein: 6.8 g/dL (ref 6.5–8.1)

## 2016-09-05 LAB — CBC
HCT: 38.4 % (ref 36.0–46.0)
Hemoglobin: 12.2 g/dL (ref 12.0–15.0)
MCH: 27.2 pg (ref 26.0–34.0)
MCHC: 31.8 g/dL (ref 30.0–36.0)
MCV: 85.5 fL (ref 78.0–100.0)
PLATELETS: 234 10*3/uL (ref 150–400)
RBC: 4.49 MIL/uL (ref 3.87–5.11)
RDW: 16 % — ABNORMAL HIGH (ref 11.5–15.5)
WBC: 6.3 10*3/uL (ref 4.0–10.5)

## 2016-09-05 MED ORDER — ATORVASTATIN CALCIUM 20 MG PO TABS
20.0000 mg | ORAL_TABLET | Freq: Every day | ORAL | 2 refills | Status: DC
Start: 2016-09-05 — End: 2016-09-14

## 2016-09-05 MED ORDER — CLOPIDOGREL BISULFATE 75 MG PO TABS
75.0000 mg | ORAL_TABLET | Freq: Every day | ORAL | Status: DC
  Administered 2016-09-05: 75 mg via ORAL
  Filled 2016-09-05: qty 1

## 2016-09-05 MED ORDER — CLOPIDOGREL BISULFATE 75 MG PO TABS: 75.0000 mg | ORAL_TABLET | Freq: Every day | ORAL | 2 refills | Status: DC

## 2016-09-05 MED ORDER — COLCHICINE 0.6 MG PO TABS: 0.6000 mg | ORAL_TABLET | Freq: Every day | ORAL | 0 refills | Status: DC

## 2016-09-05 NOTE — Discharge Summary (Signed)
[facility]  Physician Discharge Summary   Patient ID: Kristen [name] MRN: [id number] DOB/AGE: Jul 03, 1940 76 y.o.  Admit date: 09/02/2016 Discharge date: 09/05/2016  PCP: [name], MD  DISCHARGE DIAGNOSES:  Principal Problem:   Right arm weakness Active Problems:   Hypertension, uncontrolled   History of DVT (deep vein thrombosis)   HLD (hyperlipidemia)   Obesity, Class II, BMI 35-39.9, with comorbidity (HCC)   Diabetes type 2, uncontrolled (HCC)   Benign paroxysmal positional vertigo   CAD (coronary artery disease)/coronary calcifications   Anemia   History of CVA (cerebrovascular accident)   Acute kidney injury ([place])   Stroke syndrome ([place])   Stroke (cerebrum) (HCC)   RECOMMENDATIONS FOR OUTPATIENT FOLLOW UP: 1. Home health has been ordered 2. Needs better diabetic control.  DISCHARGE CONDITION: fair   INITIAL HISTORY: 76 year old female with a past medical history of stroke, diabetes, vertigo, coronary artery disease, presented with complaints of right weakness. Patient was hospitalized for further management.  Consultations:  Neurology  Procedures:  Transthoracic echocardiogram Study Conclusions  - Left ventricle: The cavity size was normal. Wall thickness was   increased in a pattern of mild LVH. Systolic function was normal.   The estimated ejection fraction was in the range of 55% to 60%.   Wall motion was normal; there were no regional wall motion   abnormalities. Doppler parameters are consistent with abnormal   left ventricular relaxation (grade 1 diastolic dysfunction). - Aortic valve: Trileaflet; mildly thickened, mildly calcified   leaflets. There was trivial regurgitation. - Left atrium: The atrium was mildly dilated.  Impressions:  - No cardiac source of emboli was indentified. Compared to the   prior study, there has been no significant interval change.  Carotid Doppler Findings consistent with a 1-39 percent  stenosis by velocity involving the right internal carotid artery and the left internal carotid artery.  Difficult exam due to patient body habitus, acoustic shadowing, tortuous vessels and a high bifurcation.  Difficult to evaluate plaque in the distal common carotid, internal and external carotid arteries.  Bilateral vertebral arteries appear patent and antegrade.  HOSPITAL COURSE:   Acute stroke Patient declined TPA. MRI showed an acute left-sided infarct. Patient was seen by neurology. Patient underwent carotid ultrasound, echocardiogram. She was on aspirin at home since her previous stroke. Discussed with patient and with neurologist today. Change to Plavix. Statin has been changed to atorvastatin. Outpatient follow-up with neurology. Patient seen by physical therapy. Home health has been recommended, which will be ordered.  Acute kidney injury. Likely due to decreased oral intake in the setting of diuretic and ARB. Normal today.  History of essential hypertension. Permissive hypertension was allowed. Now it's okay to resume home medications.  Diabetes mellitus type 2. Continue home medications. HbA1c is 9.3, implying very poor control. This was communicated to the patient and she was asked to discuss this further with her PCP.  Hyperlipidemia. Continue atorvastatin.  Anemia likely related to chronic disease. Stable.  History of coronary artery disease. Stable.  Acute gout involving right foot. Continue colchicine. Discuss with PCP regarding initiating definite treatment  Overall, stable. Stroke workup complete. Right upper extremity strength is improving. Okay for discharge.   PERTINENT LABS:  The results of significant diagnostics from this hospitalization (including imaging, microbiology, ancillary and laboratory) are listed below for reference.      Labs: Basic Metabolic Panel:  Recent Labs Lab 09/02/16 1440 09/02/16 1450 09/03/16 0434 09/04/16 0531 09/05/16 0825   NA 138 141 137 140  138  K 3.5 3.5 3.5 3.7 4.1  CL 105 104 102 103 105  CO2 26  --  _0 GLUCOSE 147* 148* 187* 149* 133*  BUN 19 22* 17 26* 25*  CREATININE 1.31* 1.40* 1.02* 1.01* 0.92  CALCIUM 9.1  --  8.9 8.8* 9.0   Liver Function Tests:  Recent Labs Lab 09/02/16 1440 09/04/16 0531 09/05/16 0825  AST 17 13* 15  ALT 12* 10* 8*  ALKPHOS 87 78 82  BILITOT 0.6 0.6 0.7  PROT 6.9 6.3* 6.8  ALBUMIN 3.3* 2.9* 3.2*   CBC:  Recent Labs Lab 09/02/16 1440 09/02/16 1450 09/04/16 0531 09/05/16 0825  WBC 9.1  --  7.3 6.3  NEUTROABS 6.0  --   --   --   HGB 11.8* 12.9 10.6* 12.2  HCT 37.1 38.0 33.7* 38.4  MCV 83.9  --  84.0 85.5  PLT 228  --  225 234   CBG:  Recent Labs Lab 09/02/16 1503  GLUCAP 120*     IMAGING STUDIES Ct Angio Neck W Or Wo Contrast  Result Date: 09/04/2016 CLINICAL DATA:  Acute left frontoparietal infarct. EXAM: CT ANGIOGRAPHY NECK TECHNIQUE: Multidetector CT imaging of the neck was performed using the standard protocol during bolus administration of intravenous contrast. Multiplanar CT image reconstructions and MIPs were obtained to evaluate the vascular anatomy. Carotid stenosis measurements (when applicable) are obtained utilizing NASCET criteria, using the distal internal carotid diameter as the denominator. CONTRAST:  50 mL Isovue 370 IV COMPARISON:  Brain MRI 09/02/2016 FINDINGS: CTA NECK FINDINGS Aortic arch: There is no aneurysm or dissection of the visualized ascending aorta or aortic arch. There is a normal 3 vessel branching pattern. The visualized proximal subclavian arteries are normal. There is aortic atherosclerosis. Right carotid system: Mixed calcified and noncalcified plaque at the right carotid bifurcation without hemodynamically significant stenosis. No aneurysm or dissection. Left carotid system: There is calcified plaque at the left carotid bifurcation without hemodynamically significant stenosis. No aneurysm or dissection. The left  internal carotid artery follows a retropharyngeal course at the level of the oropharynx. Vertebral arteries: The vertebral system is mildly left dominant. Both vertebral artery origins are normal. Both vertebral arteries are normal to their confluence with the basilar artery. Skeleton: There is congenital non fusion of the posterior C1 ring. There is multilevel uncovertebral hypertrophy the contributes to moderate foraminal narrowing. No acute fracture. Other neck: The nasopharynx is clear. The oropharynx and hypopharynx are normal. The epiglottis is normal. The supraglottic larynx, glottis and subglottic larynx are normal. No retropharyngeal collection. The parapharyngeal spaces are preserved. The parotid and submandibular glands are normal. No sialolithiasis or salivary ductal dilatation. The thyroid gland is normal. There is no cervical lymphadenopathy. Upper chest: No pneumothorax or pleural effusion. No nodules or masses. Review of the MIP images confirms the above findings IMPRESSION: 1. No aneurysm, dissection or hemodynamically significant stenosis of the carotid or vertebral arteries. 2. Aortic and carotid bifurcation calcific atherosclerosis. Electronically Signed   By: Ulyses Jarred M.D.   On: 09/04/2016 23:45   Mr Brain Wo Contrast  Result Date: 09/02/2016 CLINICAL DATA:  Right arm weakness beginning this morning while washing clothes. EXAM: MRI HEAD WITHOUT CONTRAST MRA HEAD WITHOUT CONTRAST TECHNIQUE: Multiplanar, multiecho pulse sequences of the brain and surrounding structures were obtained without intravenous contrast. Angiographic images of the head were obtained using MRA technique without contrast. COMPARISON:  Brain MRI 02/29/2016 FINDINGS: MRI HEAD FINDINGS Brain: Acute infarct in the left cerebrum,  affecting a small area perirolandic cortex, more extensive in the subjacent white matter. The infarct measures up to 18 mm on coronal acquisition. Punctate acute infarct seen in the right  centrum semiovale. Chronic small vessel ischemia with patchy FLAIR hyperintensity in the cerebral white matter. Microvascular ischemic change also affects the pons and prominently in the bilateral thalamus. Small remote right superior cerebellar infarcts. Numerous remote micro hemorrhages in the bilateral thalamus, brainstem, and cerebellum. These appear more extensive than prior, but this could be technical. Vascular: Arterial findings below. Normal dural venous sinus flow voids. Skull and upper cervical spine: Degenerative changes in the cervical spine with chronic patchy marrow hypointensity, stable. Sinuses/Orbits: Bilateral cataract resection.  No acute finding MRA HEAD FINDINGS Mildly motion degraded, but overall diagnostic. Symmetric carotid and vertebral arteries. No major branch occlusion is noted. There is a moderate narrowing at the right P1 2 junction which could be accentuated by artifact. IMPRESSION: Brain MRI: 1. Acute left perirolandic infarct, up to 18 mm in size. Punctate acute infarct in the right centrum semiovale. 2. Chronic small vessel ischemia and remote micro hemorrhages with a hypertensive pattern. Intracranial MRA: 1. No acute finding. 2. Moderate to advanced right proximal PCA stenosis. Electronically Signed   By: Monte Fantasia M.D.   On: 09/02/2016 17:10   Dg Foot 2 Views Right  Result Date: 09/03/2016 CLINICAL DATA:  Right foot pain without trauma EXAM: RIGHT FOOT - 2 VIEW COMPARISON:  None. FINDINGS: The bones are osteopenic. No fracture or dislocation. Joint spaces are preserved. There is a single cannulated lag screw traversing the talocalcaneal joint. No abnormal lucency. IMPRESSION: Osteopenia without acute osseous abnormality. Electronically Signed   By: Ulyses Jarred M.D.   On: 09/03/2016 15:57   Mm Digital Screening Bilateral  Result Date: 08/18/2016 CLINICAL DATA:  Screening. EXAM: DIGITAL SCREENING BILATERAL MAMMOGRAM WITH CAD COMPARISON:  Previous exam(s). ACR  Breast Density Category b: There are scattered areas of fibroglandular density. FINDINGS: There are no findings suspicious for malignancy. Images were processed with CAD. IMPRESSION: No mammographic evidence of malignancy. A result letter of this screening mammogram will be mailed directly to the patient. RECOMMENDATION: Screening mammogram in one year. (Code:SM-B-01Y) BI-RADS CATEGORY  1: Negative. Electronically Signed   By: Ammie Ferrier M.D.   On: 08/18/2016 11:42   Mr Jodene Nam Head/brain YS Cm  Result Date: 09/02/2016 CLINICAL DATA:  Right arm weakness beginning this morning while washing clothes. EXAM: MRI HEAD WITHOUT CONTRAST MRA HEAD WITHOUT CONTRAST TECHNIQUE: Multiplanar, multiecho pulse sequences of the brain and surrounding structures were obtained without intravenous contrast. Angiographic images of the head were obtained using MRA technique without contrast. COMPARISON:  Brain MRI 02/29/2016 FINDINGS: MRI HEAD FINDINGS Brain: Acute infarct in the left cerebrum, affecting a small area perirolandic cortex, more extensive in the subjacent white matter. The infarct measures up to 18 mm on coronal acquisition. Punctate acute infarct seen in the right centrum semiovale. Chronic small vessel ischemia with patchy FLAIR hyperintensity in the cerebral white matter. Microvascular ischemic change also affects the pons and prominently in the bilateral thalamus. Small remote right superior cerebellar infarcts. Numerous remote micro hemorrhages in the bilateral thalamus, brainstem, and cerebellum. These appear more extensive than prior, but this could be technical. Vascular: Arterial findings below. Normal dural venous sinus flow voids. Skull and upper cervical spine: Degenerative changes in the cervical spine with chronic patchy marrow hypointensity, stable. Sinuses/Orbits: Bilateral cataract resection.  No acute finding MRA HEAD FINDINGS Mildly motion degraded, but overall diagnostic.  Symmetric carotid and  vertebral arteries. No major branch occlusion is noted. There is a moderate narrowing at the right P1 2 junction which could be accentuated by artifact. IMPRESSION: Brain MRI: 1. Acute left perirolandic infarct, up to 18 mm in size. Punctate acute infarct in the right centrum semiovale. 2. Chronic small vessel ischemia and remote micro hemorrhages with a hypertensive pattern. Intracranial MRA: 1. No acute finding. 2. Moderate to advanced right proximal PCA stenosis. Electronically Signed   By: Monte Fantasia M.D.   On: 09/02/2016 17:10   Ct Head Code Stroke W/o Cm  Addendum Date: 09/02/2016   ADDENDUM REPORT: 09/02/2016 14:42 ADDENDUM: These results were called by telephone at the time of interpretation on 09/02/2016 at 2:25 pm to Dr. Orlena Sheldon , who verbally acknowledged these results. Electronically Signed   By: Franchot Gallo M.D.   On: 09/02/2016 14:42   Result Date: 09/02/2016 CLINICAL DATA:  Code stroke.  Right arm weakness EXAM: CT HEAD WITHOUT CONTRAST TECHNIQUE: Contiguous axial images were obtained from the base of the skull through the vertex without intravenous contrast. COMPARISON:  CT 02/29/2016 FINDINGS: Brain: Ventricle size normal. Patchy white matter hypodensity similar to the prior study. Small chronic infarct in the right cerebellum. Negative for acute infarct. Negative for acute hemorrhage or mass. Vascular: Negative for hyperdense vessel. Skull: Negative Sinuses/Orbits: Negative Other: None ASPECTS ([place] Stroke Program Early CT Score) - Ganglionic level infarction (caudate, lentiform nuclei, internal capsule, insula, M1-M3 cortex): 7 - Supraganglionic infarction (M4-M6 cortex): 3 Total score (0-10 with 10 being normal): 10 IMPRESSION: 1. No acute abnormality 2. ASPECTS is 10 Electronically Signed: By: Franchot Gallo M.D. On: 09/02/2016 14:23    DISCHARGE EXAMINATION: Vitals:   09/05/16 0048 09/05/16 0439 09/05/16 1019 09/05/16 1356  BP: (!) 112/58 (!) 183/67 (!) 149/69 134/72    Pulse: 82 65 67 78  Resp: _0 Temp: 98.3 F (36.8 C) 98.1 F (36.7 C) 98.1 F (36.7 C) 97.9 F (36.6 C)  TempSrc: Oral Oral Oral Oral  SpO2: 99% 99% 98% 99%  Weight:      Height:       General appearance: alert, cooperative, appears stated age and no distress Resp: clear to auscultation bilaterally Cardio: regular rate and rhythm, S1, S2 normal, no murmur, click, rub or gallop GI: soft, non-tender; bowel sounds normal; no masses,  no organomegaly Extremities: extremities normal, atraumatic, no cyanosis or edema  DISPOSITION: Home  Discharge Instructions    Ambulatory referral to Neurology    Complete by:  As directed    An appointment is requested in approximately: 4 weeks for stroke   Call MD for:  extreme fatigue    Complete by:  As directed    Call MD for:  persistant dizziness or light-headedness    Complete by:  As directed    Call MD for:  persistant nausea and vomiting    Complete by:  As directed    Call MD for:  severe uncontrolled pain    Complete by:  As directed    Call MD for:  temperature >100.4    Complete by:  As directed    Diet Carb Modified    Complete by:  As directed    Discharge instructions    Complete by:  As directed    Please be sure to follow-up with her primary care provider within one week for blood pressure check, further management of diabetes. You may need additional medications for diabetes as it  is not well controlled on your current regimen. Please also discussed with your PCP regarding management of gout.  You were cared for by a hospitalist during your hospital stay. If you have any questions about your discharge medications or the care you received while you were in the hospital after you are discharged, you can call the unit and asked to speak with the hospitalist on call if the hospitalist that took care of you is not available. Once you are discharged, your primary care physician will handle any further medical issues. Please  note that NO REFILLS for any discharge medications will be authorized once you are discharged, as it is imperative that you return to your primary care physician (or establish a relationship with a primary care physician if you do not have one) for your aftercare needs so that they can reassess your need for medications and monitor your lab values. If you do not have a primary care physician, you can call [phone] for a physician referral.   Increase activity slowly    Complete by:  As directed       ALLERGIES:  Allergies  Allergen Reactions  . Acetaminophen Other (See Comments)    "Causes me to spit up blood"  . Aleve [Naproxen Sodium] Other (See Comments)    Spits up blood  . Doxycycline Other (See Comments)    Malaise, GI upset, "felt drunk" and very ill  . Metformin And Related Other (See Comments)    Chills, dizziness  . Penicillins Rash    Has patient had a PCN reaction causing immediate rash, facial/tongue/throat swelling, SOB or lightheadedness with hypotension: Yes Has patient had a PCN reaction causing severe rash involving mucus membranes or skin necrosis: No Has patient had a PCN reaction that required hospitalization No Has patient had a PCN reaction occurring within the last 10 years: No If all of the above answers are "NO", then may proceed with Cephalosporin use.      Current Discharge Medication List    START taking these medications   Details  atorvastatin (LIPITOR) 20 MG tablet Take 1 tablet (20 mg total) by mouth daily at 6 PM. Qty: 30 tablet, Refills: 2    clopidogrel (PLAVIX) 75 MG tablet Take 1 tablet (75 mg total) by mouth daily. Qty: 30 tablet, Refills: 2    colchicine 0.6 MG tablet Take 1 tablet (0.6 mg total) by mouth daily. Qty: 10 tablet, Refills: 0      CONTINUE these medications which have NOT CHANGED   Details  clotrimazole (LOTRIMIN AF) 1 % cream Apply 1 application topically 2 (two) times daily. Rash under breast Qty: 45 g, Refills: 1      glimepiride (AMARYL) 4 MG tablet Take 1 tablet (4 mg total) by mouth daily with breakfast. Qty: 90 tablet, Refills: 3    nitroGLYCERIN (NITROSTAT) 0.4 MG SL tablet Place 0.4 mg under the tongue every 5 (five) minutes as needed for chest pain.    pioglitazone (ACTOS) 30 MG tablet Take 1 tablet (30 mg total) by mouth daily. Qty: 30 tablet, Refills: 6    potassium chloride (K-DUR) 10 MEQ tablet Take 1 tablet (10 mEq total) by mouth daily. Qty: 90 tablet, Refills: 3    Vitamin D, Ergocalciferol, (DRISDOL) 50000 units CAPS capsule Take 50,000 Units by mouth every Sunday.     Blood Glucose Monitoring Suppl (ONE TOUCH ULTRA SYSTEM KIT) w/Device KIT 1 kit by Does not apply route once. Qty: 1 each, Refills: 0  glucose blood test strip Ck blood sugar twice a day and as directed. Qty: 100 each, Refills: 3    losartan-hydrochlorothiazide (HYZAAR) 100-25 MG tablet Take 1 tablet by mouth daily. Qty: 90 tablet, Refills: 3    ONETOUCH DELICA LANCETS 06V MISC Check blood sugar twice a day and as directed. Qty: 100 each, Refills: 3      STOP taking these medications     losartan-hydrochlorothiazide (HYZAAR) 100-12.5 MG tablet      simvastatin (ZOCOR) 20 MG tablet      Cholecalciferol (VITAMIN D3) 50000 units TABS          Follow-up Information    [name], MD. Schedule an appointment as soon as possible for a visit in 1 week(s).   Specialty:  Family Medicine Contact information: [place] [address] [phone]        Garvin Fila, MD Follow up.   Specialties:  Neurology, Radiology Contact information: [address] [place] [address] [place]: 35 mins  [facility] Pager [phone]  09/05/2016, 2:46 PM

## 2016-09-05 NOTE — Plan of Care (Signed)
Problem: Education: Goal: Knowledge of disease or condition will improve Outcome: Progressing Patient educated on signs and symptoms of stroke. Patient is able to a verbalize a few signs and symptoms of stroke as well Goal: Knowledge of secondary prevention will improve Outcome: Progressing Patient educated about medications  Goal: Knowledge of patient specific risk factors addressed and post discharge goals established will improve Outcome: Progressing Stress from gout pain contributes to high BP per patient. Therefore, patient encouraged to take gout medication as prescribed to reduce stress   Problem: Coping: Goal: Ability to verbalize positive feelings about self will improve Outcome: Progressing Patient is very pleasant and chatty with nursing staff this shift. Patient is  able to move right arm a bit more today, lifting up patient's spirits.  Goal: Ability to identify appropriate support needs will improve Outcome: Progressing Patient's son visited this shift Goal: Ability to identify strategies to decrease anxiety will improve Outcome: Progressing Patient denies anxiety this shift. Will continue with care  Problem: Self-Care: Goal: Verbalization of feelings and concerns over difficulty with self-care will improve Outcome: Progressing Patient is aware of right arm and leg weakness. Patient verbalized hope about doing more adl's on her own once she is able to regain function. Goal: Ability to communicate needs accurately will improve Outcome: Progressing Patient is able to use call light and makes request known to nursing staff.

## 2016-09-05 NOTE — Progress Notes (Signed)
Pt d/c to home by car with family. Assessment stable. All questions answered. 

## 2016-09-05 NOTE — Progress Notes (Signed)
Physical Therapy Treatment Patient Details Name: Kristen [name] MRN: [id number] DOB: Jul 02, 1940 Today's Date: 09/05/2016    History of Present Illness Kristen [name] is a very pleasant 76 y.o. female with medical history significant for CVA, diabetes, vertigo, coronary artery disease, abdominal aortic atherosclerosis, gout, hyperlipidemia, hypertension, obesity, DVT and PE presents to emergency department with the chief complaint of right arm weakness.  MRI shows Acute left perirolandic infarct, up to 18 mm in size. Punctate acute infarct in the right centrum semiovale.    PT Comments    Pt continues to be limited by gout pain in R foot. Pt sitting in chair upon PT arrival and declined attempting transfers or ambulation this session as she states "I am just starting to feel better, I don't want to flare the pain back up". Pt was agreeable to seated exercise and tolerated well with minimal reports of increased pain. At end of session pt was left in recliner with LE's elevated for comfort. Will continue to follow.   Follow Up Recommendations  Home health PT;Supervision - Intermittent     Equipment Recommendations  None recommended by PT    Recommendations for Other Services       Precautions / Restrictions Precautions Precautions: Fall Restrictions Weight Bearing Restrictions: No    Mobility  Bed Mobility               General bed mobility comments: Pt received sitting up in recliner upon PT arrival.   Transfers                    Ambulation/Gait                 Stairs            Wheelchair Mobility    Modified Rankin (Stroke Patients Only) Modified Rankin (Stroke Patients Only) Pre-Morbid Rankin Score: No symptoms Modified Rankin: Moderately severe disability     Balance Overall balance assessment: Needs assistance Sitting-balance support: Feet supported;No upper extremity supported Sitting balance-Leahy Scale: Fair   Postural control:  Posterior lean Standing balance support: Single extremity supported Standing balance-Leahy Scale: Poor Standing balance comment: requires UE support to maintain balance                            Cognition Arousal/Alertness: Awake/alert Behavior During Therapy: WFL for tasks assessed/performed Overall Cognitive Status: Within Functional Limits for tasks assessed                                        Exercises General Exercises - Upper Extremity Shoulder Flexion: AAROM;10 reps General Exercises - Lower Extremity Long Arc Quad: 10 reps;Right;15 reps;Left Hip ABduction/ADduction: 10 reps;Both (isometric hip add, AROM abd ) Hip Flexion/Marching: 10 reps;Both Heel Raises: 10 reps;Both    General Comments        Pertinent Vitals/Pain Pain Assessment: Faces Faces Pain Scale: Hurts even more Pain Location: R foot Pain Descriptors / Indicators: Guarding;Sharp (Gout) Pain Intervention(s): Limited activity within patient's tolerance    Home Living                      Prior Function            PT Goals (current goals can now be found in the care plan section) Acute Rehab PT Goals Patient Stated Goal: back  home PT Goal Formulation: With patient Time For Goal Achievement: 09/16/16 Potential to Achieve Goals: Good Progress towards PT goals: Progressing toward goals    Frequency    Min 3X/week      PT Plan Current plan remains appropriate    Co-evaluation              AM-PAC PT "6 Clicks" Daily Activity  Outcome Measure  Difficulty turning over in bed (including adjusting bedclothes, sheets and blankets)?: A Little Difficulty moving from lying on back to sitting on the side of the bed? : A Little Difficulty sitting down on and standing up from a chair with arms (e.g., wheelchair, bedside commode, etc,.)?: Total Help needed moving to and from a bed to chair (including a wheelchair)?: A Lot Help needed walking in hospital  room?: A Lot Help needed climbing 3-5 steps with a railing? : A Lot 6 Click Score: 13    End of Session   Activity Tolerance: Patient tolerated treatment well Patient left: in chair;with call bell/phone within reach;with chair alarm set Nurse Communication: Precautions (gout pain) PT Visit Diagnosis: Unsteadiness on feet (R26.81);Muscle weakness (generalized) (M62.81)     Time: 3338-3291 PT Time Calculation (min) (ACUTE ONLY): 23 min  Charges:  $Therapeutic Exercise: 23-37 mins                    G Codes:       [name], PT, DPT Acute Rehabilitation Services Pager: [place] 09/05/2016, 10:16 AM

## 2016-09-05 NOTE — Care Management Note (Signed)
Case Management Note  Patient Details  Name: Kristen [name] MRN: [id number] Date of Birth: Aug 08, 1940  Subjective/Objective:                    Action/Plan: Pt discharging home with orders for [facility] services. Pt with [facility] RN ordered but patient refusing this service. MD made aware and in agreement to Not have [facility] RN.  CM provided her a list of [place] agencies. She selected [facility]. Santiago Glad with [facility] notified and accepted the referral.  Pt with orders for  3 in 1. Pt states she has this DME at home.  Pt states she has transportation home.   Expected Discharge Date:  09/05/16               Expected Discharge Plan:  [place]  In-House Referral:     Discharge planning Services  CM Consult  Post Acute Care Choice:  Durable Medical Equipment, Home Health Choice offered to:  Patient  DME Arranged:  3-N-1 (pt refused) DME Agency:     HH Arranged:  PT, OT HH Agency:  [facility]  Status of Service:  Completed, signed off  If discussed at [place] of Stay Meetings, dates discussed:    Additional Comments:  Pollie Friar, RN 09/05/2016, 12:24 PM

## 2016-09-05 NOTE — Progress Notes (Signed)
STROKE TEAM PROGRESS NOTE   HISTORY OF PRESENT ILLNESS (per record) Kristen [name] is a 76 y.o. female LSW 10:30am but later developed  acute right arm and leg weakness and numbness. CT Brain negative.  She was taking ASA 81 mg qd at home.  She has a history of prior stroke and DM.    SUBJECTIVE (INTERVAL HISTORY) Her  Family is not at the bedside.  She has remained stable without recurrent symptoms. She states her right hand weakness is improving. She wants to go home. CT angiogram of the neck showed no significant extracranial stenosis  OBJECTIVE Temp:  [98.1 F (36.7 C)-99.5 F (37.5 C)] 98.1 F (36.7 C) (05/15 1019) Pulse Rate:  [62-82] 67 (05/15 1019) Cardiac Rhythm: Normal sinus rhythm (05/15 0700) Resp:  [16-20] 20 (05/15 1019) BP: (112-183)/(58-77) 149/69 (05/15 1019) SpO2:  [98 %-100 %] 98 % (05/15 1019)  CBC:   Recent Labs Lab 09/02/16 1440  09/04/16 0531 09/05/16 0825  WBC 9.1  --  7.3 6.3  NEUTROABS 6.0  --   --   --   HGB 11.8*  < > 10.6* 12.2  HCT 37.1  < > 33.7* 38.4  MCV 83.9  --  84.0 85.5  PLT 228  --  225 234  < > = values in this interval not displayed.  Basic Metabolic Panel:   Recent Labs Lab 09/04/16 0531 09/05/16 0825  NA 140 138  K 3.7 4.1  CL 103 105  CO2 27 22  GLUCOSE 149* 133*  BUN 26* 25*  CREATININE 1.01* 0.92  CALCIUM 8.8* 9.0    Lipid Panel:     Component Value Date/Time   CHOL 136 09/03/2016 0434   CHOL 138 07/03/2013 0442   TRIG 62 09/03/2016 0434   TRIG 77 07/03/2013 0442   HDL 49 09/03/2016 0434   HDL 41 07/03/2013 0442   CHOLHDL 2.8 09/03/2016 0434   VLDL 12 09/03/2016 0434   VLDL 15 07/03/2013 0442   LDLCALC 75 09/03/2016 0434   LDLCALC 82 07/03/2013 0442   HgbA1c:  Lab Results  Component Value Date   HGBA1C 9.3 (H) 09/03/2016   Urine Drug Screen: No results found for: LABOPIA, COCAINSCRNUR, LABBENZ, AMPHETMU, THCU, LABBARB  Alcohol Level No results found for: [facility]   IMAGING  Mr Jodene Nam Head/brain Wo  Cm 09/02/2016  MRI:  1. Acute left perirolandic infarct, up to 18 mm in size. Punctate acute infarct in the right centrum semiovale.  2. Chronic small vessel ischemia and remote micro hemorrhages with a hypertensive pattern.  MRA:  1. No acute finding.  2. Moderate to advanced right proximal PCA stenosis.     Ct Head Code Stroke W/o Cm 09/02/2016 1. No acute abnormality  2. ASPECTS is 10   CT angio neck no significant extracranial stenosis  PHYSICAL EXAM Pleasant elderly lady not in distress. . Afebrile. Head is nontraumatic. Neck is supple without bruit.    Cardiac exam no murmur or gallop. Lungs are clear to auscultation. Distal pulses are well felt.  Neurological Exam :  Awake alert oriented x 3 normal speech and language. Mild left lower face asymmetry. Tongue midline. No drift. Mild diminished fine finger movements on right. Orbits left over right upper extremity. Mild right grip weak.. Normal sensation . Normal coordination.Gaitdeferred     ASSESSMENT/PLAN [name] is a 76 y.o. female with history of a prior stroke, diabetes mellitus, syncope, obstructive sleep apnea, coronary artery disease with MI, hypertension, hyperlipidemia, history  of pulmonary embolism, diabetes mellitus, abdominal aortic aneurysm, and frequent headaches presenting with acute onset of right arm and leg weakness / numbness. She did not receive IV t-PA as the patient declined due to risk of hemorrhage.   Stroke: Bilateral infarcts. Likely small vessel disease  Resultant  Mild right hemiparesis  MRI - Acute left perirolandic infarct, up to 18 mm in size. Punctate acute infarct in the right centrum semiovale.   MRA - Moderate to advanced right proximal PCA stenosis.  Carotid Doppler - 1-39 percent stenosis by velocity involving the right internal carotid artery and the left internal carotid artery. Difficult exam due to patient body habitus, acoustic shadowing, tortuous vessels and a high  bifurcation. Difficult to evaluate plaque in the distal common carotid, internal and external carotid arteries. Bilateral vertebral arteries appear  patent and antegrade. 2D Echo - Left ventricle: The cavity size was normal. Wall thickness was   increased in a pattern of mild LVH. Systolic function was normal.   The estimated ejection fraction was in the range of 55% to 60%.   Wall motion was normal; there were no regional wall motion    abnormalities  LDL - 75  HgbA1c 9.3  VTE prophylaxis - SCDs Diet Carb Modified Fluid consistency: Thin; Room service appropriate? Yes Diet Carb Modified  No antithrombotic prior to admission, now on aspirin 325 mg daily  Patient counseled to be compliant with her antithrombotic medications  Ongoing aggressive stroke risk factor management  Therapy recommendations:  Home health PT. OT evaluation pending.  Disposition:  Pending  Hypertension  Stable  Permissive hypertension (OK if < 220/120) but gradually normalize in 5-7 days  Long-term BP goal normotensive  Hyperlipidemia  Home meds:  Zocor 20 mg daily resumed in hospital  LDL 75, goal < 70  Increase Zocor to 40 mg daily  Continue statin at discharge  Diabetes  HgbA1c 9.3 goal < 7.0  Unc / Controlled  Other Stroke Risk Factors  Advanced age  ETOH use, advised to drink no more than 1 drink per day  Obesity, Body mass index is 41.11 kg/m., recommend weight loss, diet and exercise as appropriate   Hx stroke/TIA  Coronary artery disease  Obstructive sleep apnea  Other Active Problems  Elevated creatinine - 1.31 -> 1.40 -> 1.02  Hospital day # 2  I have personally examined this patient, reviewed notes, independently viewed imaging studies, participated in medical decision making and plan of care.ROS completed by me personally and pertinent positives fully documented  I have made any additions or clarifications directly to the above note.  She presented with right  sided weakness due to left some cortical infarct likely from small vessel disease. Check Ct angio neck Continue aspirin 325 mg daily and   discharge home today. Follow-up as an outpatient in stroke clinic in 6 weeks. Stroke team and sign off..Greater than 50% time during this 15 minute visit to was spent on counseling and coordination of care  Lacunar infarct, stroke evaluation and treatment discussion and answering questions.  Antony Contras, MD Medical Director [facility] Stroke Center Pager: [phone] 09/05/2016 11:35 AM   To contact Stroke Continuity provider, please refer to [email or web address] After hours, contact General Neurology

## 2016-09-05 NOTE — Progress Notes (Signed)
Occupational Therapy Treatment Patient Details Name: Kristen [name] MRN: [id number] DOB: 1940-08-27 Today's Date: 09/05/2016    History of present illness Kristen [name] is a very pleasant 76 y.o. female with medical history significant for CVA, diabetes, vertigo, coronary artery disease, abdominal aortic atherosclerosis, gout, hyperlipidemia, hypertension, obesity, DVT and PE presents to emergency department with the chief complaint of right arm weakness.  MRI shows Acute left perirolandic infarct, up to 18 mm in size. Punctate acute infarct in the right centrum semiovale.   OT comments  Pt progressing toward OT goals but remains limited by R foot gout pain and declined mobility this session. Pt able to complete self-feeding tasks with increased time and noted decreased coordination with this task. Pt able to complete AAROM for reaching tasks with R UE demonstrating 2/5 strength. Educated pt concerning self-ROM and incorporating R UE into ADL to improve functional use of R UE. Pt reports that her granddaughter will be able to provide assistance overnight while recovering from gout pain. Discussed importance of 24 hour assistance initially in order to maximize safety. D/C plan remains appropriate. OT will continue to follow while admitted.   Follow Up Recommendations  Home health OT;Supervision/Assistance - 24 hour    Equipment Recommendations  3 in 1 bedside commode    Recommendations for Other Services      Precautions / Restrictions Precautions Precautions: Fall Restrictions Weight Bearing Restrictions: No       Mobility Bed Mobility               General bed mobility comments: OOB in chair this session.  Transfers                 General transfer comment: Pt declined standing this session due to R foot pain from gout.    Balance Overall balance assessment: Needs assistance Sitting-balance support: Feet supported;No upper extremity supported Sitting  balance-Leahy Scale: Fair   Postural control: Posterior lean Standing balance support: Single extremity supported Standing balance-Leahy Scale: Poor Standing balance comment: requires UE support to maintain balance                           ADL either performed or assessed with clinical judgement   ADL Overall ADL's : Needs assistance/impaired Eating/Feeding: Set up;Sitting Eating/Feeding Details (indicate cue type and reason): Able to feed self slowly and with decreased coordination noted. Encouraged pt to utilize R UE to complete functional tasks in order to improve coordination and strength.  Grooming: Sitting;Brushing hair;Cueing for sequencing;Minimal assistance Grooming Details (indicate cue type and reason): Min assist for lifting R UE to reach back of hair during hair brushing task.                               General ADL Comments: Mobility limited by pt's continued R LE gout pain. Educated pt on self ROM for R UE in all planes. Pt with improved R shoulder strength and demonstrated 2/5 strength. She does report some pain in R shoulder.     Vision   Additional Comments: Reports " I think my vision is coming back" but unable to report what the deficits she was experiencing were. Able to utilize vision functionally during session.    Perception     Praxis      Cognition Arousal/Alertness: Awake/alert Behavior During Therapy: WFL for tasks assessed/performed Overall Cognitive Status: Within Functional Limits for  tasks assessed                                          Exercises Exercises: General Upper Extremity General Exercises - Upper Extremity Shoulder Flexion: AAROM;10 reps Elbow Flexion: AAROM;10 reps;Seated General Exercises - Lower Extremity Long Arc Quad: 10 reps;Right;15 reps;Left Hip ABduction/ADduction: 10 reps;Both (isometric hip add, AROM abd ) Hip Flexion/Marching: 10 reps;Both Heel Raises: 10 reps;Both Other  Exercises Other Exercises: Facilitated R UE weight bearing during reaching tasks with L UE.  Other Exercises: Educated pt on self ROM in all planes to facilitated improved functional use of R UE.   Shoulder Instructions       General Comments      Pertinent Vitals/ Pain       Pain Assessment: Faces Faces Pain Scale: Hurts little more Pain Location: R foot Pain Descriptors / Indicators: Guarding;Sharp (Gout) Pain Intervention(s): Limited activity within patient's tolerance;Monitored during session  Home Living                                          Prior Functioning/Environment              Frequency  Min 3X/week        Progress Toward Goals  OT Goals(current goals can now be found in the care plan section)  Progress towards OT goals: Progressing toward goals  Acute Rehab OT Goals Patient Stated Goal: back home OT Goal Formulation: With patient Time For Goal Achievement: 09/10/16 Potential to Achieve Goals: Good ADL Goals Pt Will Perform Grooming: with min guard assist;standing Pt Will Perform Lower Body Bathing: with min guard assist;sit to/from stand Pt Will Perform Lower Body Dressing: with min guard assist;sit to/from stand Pt Will Transfer to Toilet: with min guard assist;ambulating;bedside commode (over toilet) Pt Will Perform Toileting - Clothing Manipulation and hygiene: with min guard assist;sit to/from stand Pt/caregiver will Perform Home Exercise Program: Increased strength;Right Upper extremity;Independently  Plan Discharge plan remains appropriate    Co-evaluation                 AM-PAC PT "6 Clicks" Daily Activity     Outcome Measure   Help from another person eating meals?: A Little Help from another person taking care of personal grooming?: A Little Help from another person toileting, which includes using toliet, bedpan, or urinal?: A Little Help from another person bathing (including washing, rinsing, drying)?: A  Lot Help from another person to put on and taking off regular upper body clothing?: A Little Help from another person to put on and taking off regular lower body clothing?: A Lot 6 Click Score: 16    End of Session Equipment Utilized During Treatment: Gait belt  OT Visit Diagnosis: Unsteadiness on feet (R26.81);Pain;Hemiplegia and hemiparesis;Muscle weakness (generalized) (M62.81);Other abnormalities of gait and mobility (R26.89) Hemiplegia - Right/Left: Right Hemiplegia - dominant/non-dominant: Dominant Hemiplegia - caused by: Cerebral infarction Pain - Right/Left: Right Pain - part of body: Ankle and joints of foot   Activity Tolerance Patient tolerated treatment well   Patient Left in chair;with call bell/phone within reach   Nurse Communication          Time: 0867-6195 OT Time Calculation (min): 19 min  Charges: OT General Charges $OT Visit: 1 Procedure OT Treatments $Self  Care/Home Management : 8-22 mins  Kristen Herrlich, MS OTR/L  Pager: [place] 09/05/2016, 1:50 PM

## 2016-09-05 NOTE — Progress Notes (Signed)
Inpatient Diabetes Program Recommendations  AACE/ADA: New Consensus Statement on Inpatient Glycemic Control (2015)  Target Ranges:  Prepandial:   less than 140 mg/dL      Peak postprandial:   less than 180 mg/dL (1-2 hours)      Critically ill patients:  140 - 180 mg/dL   Review of Glycemic Control  Diabetes history: DM 2 Outpatient Diabetes medications: Actos 30 mg Daily, Amaryl 4 mg Daily Current orders for Inpatient glycemic control: Actos 30 mg Daily, Amaryl 4 mg Daily  Inpatient Diabetes Program Recommendations:    Patient has a history of DM. While inpatient on oral DM medications, please consider ordering CBGs and Novolog Sensitive Correction tid + HS scale.  A1c 9.3% on 09/03/16 down from 10.1% on 08/09/16  Thanks,  Tama Headings RN, MSN, [facility] Inpatient Diabetes Coordinator Team Pager [phone] (8a-5p)

## 2016-09-06 ENCOUNTER — Telehealth: Payer: Self-pay | Admitting: Family Medicine

## 2016-09-06 ENCOUNTER — Telehealth: Payer: Self-pay | Admitting: *Deleted

## 2016-09-06 DIAGNOSIS — I2510 Atherosclerotic heart disease of native coronary artery without angina pectoris: Secondary | ICD-10-CM | POA: Diagnosis not present

## 2016-09-06 DIAGNOSIS — E785 Hyperlipidemia, unspecified: Secondary | ICD-10-CM | POA: Diagnosis not present

## 2016-09-06 DIAGNOSIS — Z86711 Personal history of pulmonary embolism: Secondary | ICD-10-CM | POA: Diagnosis not present

## 2016-09-06 DIAGNOSIS — Z7984 Long term (current) use of oral hypoglycemic drugs: Secondary | ICD-10-CM | POA: Diagnosis not present

## 2016-09-06 DIAGNOSIS — E119 Type 2 diabetes mellitus without complications: Secondary | ICD-10-CM | POA: Diagnosis not present

## 2016-09-06 DIAGNOSIS — D649 Anemia, unspecified: Secondary | ICD-10-CM | POA: Diagnosis not present

## 2016-09-06 DIAGNOSIS — Z7902 Long term (current) use of antithrombotics/antiplatelets: Secondary | ICD-10-CM | POA: Diagnosis not present

## 2016-09-06 DIAGNOSIS — I714 Abdominal aortic aneurysm, without rupture: Secondary | ICD-10-CM | POA: Diagnosis not present

## 2016-09-06 DIAGNOSIS — Z86718 Personal history of other venous thrombosis and embolism: Secondary | ICD-10-CM | POA: Diagnosis not present

## 2016-09-06 DIAGNOSIS — I69351 Hemiplegia and hemiparesis following cerebral infarction affecting right dominant side: Secondary | ICD-10-CM | POA: Diagnosis not present

## 2016-09-06 DIAGNOSIS — R42 Dizziness and giddiness: Secondary | ICD-10-CM | POA: Diagnosis not present

## 2016-09-06 DIAGNOSIS — G4730 Sleep apnea, unspecified: Secondary | ICD-10-CM | POA: Diagnosis not present

## 2016-09-06 DIAGNOSIS — I10 Essential (primary) hypertension: Secondary | ICD-10-CM | POA: Diagnosis not present

## 2016-09-06 NOTE — Telephone Encounter (Signed)
Gerald Stabs from [facility] left message stating she has level 1 medication interaction. Clopidogrel 75 mg, interacts with pioglitazone 30 mg. New medication prescribed from hospital. Can reach him at [phone].

## 2016-09-06 NOTE — Telephone Encounter (Signed)
Transition Care Management Follow-up Telephone Call   Date discharged? 09/05/2016   How have you been since you were released from the hospital? "making it"   Do you understand why you were in the hospital? yes   Do you understand the discharge instructions? yes   Where were you discharged to? Home    Items Reviewed:  Medications reviewed: yes  Allergies reviewed: yes  Dietary changes reviewed: n/a  Referrals reviewed: n/a   Functional Questionnaire:   Activities of Daily Living (ADLs):   She states they are independent in the following:pt is not needing assistance with any ADLs    Any transportation issues/concerns?: no   Any patient concerns? no   Confirmed importance and date/time of follow-up visits scheduled yes  Provider Appointment booked with Dr [name] 5/25 @ 0945  Confirmed with patient if condition begins to worsen call PCP or go to the ER.  Patient was given the office number and encouraged to call back with question or concerns.  : yes

## 2016-09-07 NOTE — Telephone Encounter (Signed)
Noted increased hypoglycemia risk will discuss at [facility].  Lab Results  Component Value Date   HGBA1C 9.3 (H) 09/03/2016

## 2016-09-08 DIAGNOSIS — R42 Dizziness and giddiness: Secondary | ICD-10-CM | POA: Diagnosis not present

## 2016-09-08 DIAGNOSIS — I714 Abdominal aortic aneurysm, without rupture: Secondary | ICD-10-CM | POA: Diagnosis not present

## 2016-09-08 DIAGNOSIS — Z7984 Long term (current) use of oral hypoglycemic drugs: Secondary | ICD-10-CM | POA: Diagnosis not present

## 2016-09-08 DIAGNOSIS — E119 Type 2 diabetes mellitus without complications: Secondary | ICD-10-CM | POA: Diagnosis not present

## 2016-09-08 DIAGNOSIS — I2510 Atherosclerotic heart disease of native coronary artery without angina pectoris: Secondary | ICD-10-CM | POA: Diagnosis not present

## 2016-09-08 DIAGNOSIS — G4730 Sleep apnea, unspecified: Secondary | ICD-10-CM | POA: Diagnosis not present

## 2016-09-08 DIAGNOSIS — I69351 Hemiplegia and hemiparesis following cerebral infarction affecting right dominant side: Secondary | ICD-10-CM | POA: Diagnosis not present

## 2016-09-08 DIAGNOSIS — Z86711 Personal history of pulmonary embolism: Secondary | ICD-10-CM | POA: Diagnosis not present

## 2016-09-08 DIAGNOSIS — D649 Anemia, unspecified: Secondary | ICD-10-CM | POA: Diagnosis not present

## 2016-09-08 DIAGNOSIS — Z86718 Personal history of other venous thrombosis and embolism: Secondary | ICD-10-CM | POA: Diagnosis not present

## 2016-09-08 DIAGNOSIS — E785 Hyperlipidemia, unspecified: Secondary | ICD-10-CM | POA: Diagnosis not present

## 2016-09-08 DIAGNOSIS — Z7902 Long term (current) use of antithrombotics/antiplatelets: Secondary | ICD-10-CM | POA: Diagnosis not present

## 2016-09-08 DIAGNOSIS — I10 Essential (primary) hypertension: Secondary | ICD-10-CM | POA: Diagnosis not present

## 2016-09-11 DIAGNOSIS — Z7902 Long term (current) use of antithrombotics/antiplatelets: Secondary | ICD-10-CM | POA: Diagnosis not present

## 2016-09-11 DIAGNOSIS — I2510 Atherosclerotic heart disease of native coronary artery without angina pectoris: Secondary | ICD-10-CM | POA: Diagnosis not present

## 2016-09-11 DIAGNOSIS — D649 Anemia, unspecified: Secondary | ICD-10-CM | POA: Diagnosis not present

## 2016-09-11 DIAGNOSIS — I69351 Hemiplegia and hemiparesis following cerebral infarction affecting right dominant side: Secondary | ICD-10-CM | POA: Diagnosis not present

## 2016-09-11 DIAGNOSIS — I714 Abdominal aortic aneurysm, without rupture: Secondary | ICD-10-CM | POA: Diagnosis not present

## 2016-09-11 DIAGNOSIS — E119 Type 2 diabetes mellitus without complications: Secondary | ICD-10-CM | POA: Diagnosis not present

## 2016-09-11 DIAGNOSIS — I10 Essential (primary) hypertension: Secondary | ICD-10-CM | POA: Diagnosis not present

## 2016-09-11 DIAGNOSIS — Z7984 Long term (current) use of oral hypoglycemic drugs: Secondary | ICD-10-CM | POA: Diagnosis not present

## 2016-09-11 DIAGNOSIS — G4730 Sleep apnea, unspecified: Secondary | ICD-10-CM | POA: Diagnosis not present

## 2016-09-11 DIAGNOSIS — E785 Hyperlipidemia, unspecified: Secondary | ICD-10-CM | POA: Diagnosis not present

## 2016-09-11 DIAGNOSIS — Z86718 Personal history of other venous thrombosis and embolism: Secondary | ICD-10-CM | POA: Diagnosis not present

## 2016-09-11 DIAGNOSIS — R42 Dizziness and giddiness: Secondary | ICD-10-CM | POA: Diagnosis not present

## 2016-09-11 DIAGNOSIS — Z86711 Personal history of pulmonary embolism: Secondary | ICD-10-CM | POA: Diagnosis not present

## 2016-09-13 DIAGNOSIS — G4730 Sleep apnea, unspecified: Secondary | ICD-10-CM | POA: Diagnosis not present

## 2016-09-13 DIAGNOSIS — I2510 Atherosclerotic heart disease of native coronary artery without angina pectoris: Secondary | ICD-10-CM | POA: Diagnosis not present

## 2016-09-13 DIAGNOSIS — Z86718 Personal history of other venous thrombosis and embolism: Secondary | ICD-10-CM | POA: Diagnosis not present

## 2016-09-13 DIAGNOSIS — Z7902 Long term (current) use of antithrombotics/antiplatelets: Secondary | ICD-10-CM | POA: Diagnosis not present

## 2016-09-13 DIAGNOSIS — E785 Hyperlipidemia, unspecified: Secondary | ICD-10-CM | POA: Diagnosis not present

## 2016-09-13 DIAGNOSIS — D649 Anemia, unspecified: Secondary | ICD-10-CM | POA: Diagnosis not present

## 2016-09-13 DIAGNOSIS — Z7984 Long term (current) use of oral hypoglycemic drugs: Secondary | ICD-10-CM | POA: Diagnosis not present

## 2016-09-13 DIAGNOSIS — I714 Abdominal aortic aneurysm, without rupture: Secondary | ICD-10-CM | POA: Diagnosis not present

## 2016-09-13 DIAGNOSIS — E119 Type 2 diabetes mellitus without complications: Secondary | ICD-10-CM | POA: Diagnosis not present

## 2016-09-13 DIAGNOSIS — Z86711 Personal history of pulmonary embolism: Secondary | ICD-10-CM | POA: Diagnosis not present

## 2016-09-13 DIAGNOSIS — I10 Essential (primary) hypertension: Secondary | ICD-10-CM | POA: Diagnosis not present

## 2016-09-13 DIAGNOSIS — R42 Dizziness and giddiness: Secondary | ICD-10-CM | POA: Diagnosis not present

## 2016-09-13 DIAGNOSIS — I69351 Hemiplegia and hemiparesis following cerebral infarction affecting right dominant side: Secondary | ICD-10-CM | POA: Diagnosis not present

## 2016-09-14 ENCOUNTER — Ambulatory Visit (INDEPENDENT_AMBULATORY_CARE_PROVIDER_SITE_OTHER): Payer: Medicare Other | Admitting: Family Medicine

## 2016-09-14 ENCOUNTER — Encounter: Payer: Self-pay | Admitting: Family Medicine

## 2016-09-14 VITALS — BP 140/82 | HR 72 | Temp 98.3°F | Ht 61.0 in | Wt 205.5 lb

## 2016-09-14 DIAGNOSIS — I69351 Hemiplegia and hemiparesis following cerebral infarction affecting right dominant side: Secondary | ICD-10-CM

## 2016-09-14 DIAGNOSIS — E785 Hyperlipidemia, unspecified: Secondary | ICD-10-CM | POA: Diagnosis not present

## 2016-09-14 DIAGNOSIS — E119 Type 2 diabetes mellitus without complications: Secondary | ICD-10-CM | POA: Diagnosis not present

## 2016-09-14 DIAGNOSIS — I639 Cerebral infarction, unspecified: Secondary | ICD-10-CM

## 2016-09-14 DIAGNOSIS — Z86718 Personal history of other venous thrombosis and embolism: Secondary | ICD-10-CM | POA: Diagnosis not present

## 2016-09-14 DIAGNOSIS — I10 Essential (primary) hypertension: Secondary | ICD-10-CM

## 2016-09-14 DIAGNOSIS — I714 Abdominal aortic aneurysm, without rupture: Secondary | ICD-10-CM | POA: Diagnosis not present

## 2016-09-14 DIAGNOSIS — Z86711 Personal history of pulmonary embolism: Secondary | ICD-10-CM | POA: Diagnosis not present

## 2016-09-14 DIAGNOSIS — E1165 Type 2 diabetes mellitus with hyperglycemia: Secondary | ICD-10-CM | POA: Diagnosis not present

## 2016-09-14 DIAGNOSIS — Z7984 Long term (current) use of oral hypoglycemic drugs: Secondary | ICD-10-CM | POA: Diagnosis not present

## 2016-09-14 DIAGNOSIS — R42 Dizziness and giddiness: Secondary | ICD-10-CM | POA: Diagnosis not present

## 2016-09-14 DIAGNOSIS — E118 Type 2 diabetes mellitus with unspecified complications: Secondary | ICD-10-CM

## 2016-09-14 DIAGNOSIS — M1009 Idiopathic gout, multiple sites: Secondary | ICD-10-CM

## 2016-09-14 DIAGNOSIS — D649 Anemia, unspecified: Secondary | ICD-10-CM | POA: Diagnosis not present

## 2016-09-14 DIAGNOSIS — G4730 Sleep apnea, unspecified: Secondary | ICD-10-CM | POA: Diagnosis not present

## 2016-09-14 DIAGNOSIS — IMO0002 Reserved for concepts with insufficient information to code with codable children: Secondary | ICD-10-CM

## 2016-09-14 DIAGNOSIS — Z7902 Long term (current) use of antithrombotics/antiplatelets: Secondary | ICD-10-CM | POA: Diagnosis not present

## 2016-09-14 DIAGNOSIS — I2510 Atherosclerotic heart disease of native coronary artery without angina pectoris: Secondary | ICD-10-CM | POA: Diagnosis not present

## 2016-09-14 MED ORDER — COLCHICINE 0.6 MG PO TABS: 0.6000 mg | ORAL_TABLET | Freq: Every day | ORAL | 3 refills | Status: DC

## 2016-09-14 MED ORDER — ATORVASTATIN CALCIUM 20 MG PO TABS: 20.0000 mg | ORAL_TABLET | Freq: Every day | ORAL | 3 refills | Status: DC

## 2016-09-14 MED ORDER — LOSARTAN POTASSIUM 50 MG PO TABS
50.0000 mg | ORAL_TABLET | Freq: Every day | ORAL | 3 refills | Status: DC
Start: 2016-09-14 — End: 2016-09-19

## 2016-09-14 NOTE — Assessment & Plan Note (Signed)
Acute L sided stroke with residual R hemiparesis. She had not started her aspirin yet. Now on plavix. Discussed importance of continuing medication. She continues working with [facility] PT on strengthening.

## 2016-09-14 NOTE — Patient Instructions (Addendum)
I want you to double check at home to see if we have any blood pressure medicine and let me know. New blood pressure medicine dose will be plain losartan 50mg  daily - sent to pharmacy.  Continue other medicines as up to now. Stay off aspirin for now.  Return in 2 weeks for blood pressure check.  We will touch base with home health nurse about blood pressures.

## 2016-09-14 NOTE — Assessment & Plan Note (Signed)
Simvastatin changed to atorvastatin 20mg .

## 2016-09-14 NOTE — Assessment & Plan Note (Addendum)
Reports compliance with actos 30mg  daily and amaryl 4mg  daily - actos was recently added last month. Endorses good control at home by recall cbg's checked by [facility] agency. Continue.

## 2016-09-14 NOTE — Addendum Note (Signed)
Addended by: [name] on: 09/14/2016 11:18 AM   Modules accepted: Orders

## 2016-09-14 NOTE — Assessment & Plan Note (Addendum)
Chronic. She had not yet picked up higher hyzaar dose. bp better controlled today. Confusion over medications - she does not have hyzaar in her medicine bag today, doesn't think she's even been taking this medication. I advised she double check at home and will ask my CMA to contact [facility] agency for assistance. Concern for overtreatment if we start hyzaar 100/25 - so I will start losartan 50mg  in its place - sent to pharmacy.   ADDENDUM ==> my CMA contacted [facility] - no RN involved in care. I will see if we can ask [facility] for evaluation and assistance with med administration given med confusion found today.

## 2016-09-14 NOTE — Progress Notes (Addendum)
BP 140/82   Pulse 72   Temp 98.3 F (36.8 C)   Ht '5\' 1"'  (1.549 m)   Wt 205 lb 8 oz (93.2 kg)   SpO2 98%   BMI 38.83 kg/m    CC: hosp f/u visit Subjective:    Patient ID: Kristen [name], female    DOB: 1940/10/19, 76 y.o.   MRN: [id number]  HPI: Kristen [name] is a 76 y.o. female presenting on 09/14/2016 for Hospitalization Follow-up   Pt upset I didn't come visit her in the hospital.   Recent hospitalization for acute cerebral stroke presenting with R arm weakness. She had  Patient declined TPA. Discharged with [name] and rec outpatient neurology f/u. She did have an acute gout flare to R foot treated with colchicine.   Started on plavix - which interacts with actos (increased hypoglycemia risk - discussed).   DM - last month we started actos 24m daily. She states cbg's have been well controlled at home (by Kristen [name].  HTN - last month we increased hyzaar 100/229m She did not start this. She does not have hyzaar in her bag of medicines today, doesn't think she's been taking blood pressure medicines since she's been home.   MRI showed acute L sided infarct.  Echocardiogram reviewed - stable. Carotid ultrasound reviewed - stable.   Planned f/u with neurology 10/2016.  Admit date: 09/02/2016 Discharge date: 09/05/2016 TCM hosp f/u phone call performed 09/06/2016  DISCHARGE DIAGNOSES:  Principal Problem:   Right arm weakness Active Problems:   Hypertension, uncontrolled   History of DVT (deep vein thrombosis)   HLD (hyperlipidemia)   Obesity, Class II, BMI 35-39.9, with comorbidity (HCC)   Diabetes type 2, uncontrolled (HCC)   Benign paroxysmal positional vertigo   CAD (coronary artery disease)/coronary calcifications   Anemia   History of CVA (cerebrovascular accident)   Acute [place] ([place]  Stroke syndrome ([place]  Stroke (cerebrum) (HCC)  RECOMMENDATIONS FOR OUTPATIENT FOLLOW UP: 1. Home health has been ordered 2. Needs better diabetic control.  DISCHARGE  CONDITION: fair Consultations:  Neurology  Relevant past medical, surgical, family and social history reviewed and updated as indicated. Interim medical history since our last visit reviewed. Allergies and medications reviewed and updated. Outpatient Medications Prior to Visit  Medication Sig Dispense Refill  . Blood Glucose Monitoring Suppl (ONE TOUCH ULTRA SYSTEM KIT) w/Device KIT 1 kit by Does not apply route once. 1 each 0  . clopidogrel (PLAVIX) 75 MG tablet Take 1 tablet (75 mg total) by mouth daily. 30 tablet 2  . clotrimazole (LOTRIMIN AF) 1 % cream Apply 1 application topically 2 (two) times daily. Rash under breast 45 g 1  . glimepiride (AMARYL) 4 MG tablet Take 1 tablet (4 mg total) by mouth daily with breakfast. 90 tablet 3  . glucose blood test strip Ck blood sugar twice a day and as directed. 100 each 3  . nitroGLYCERIN (NITROSTAT) 0.4 MG SL tablet Place 0.4 mg under the tongue every 5 (five) minutes as needed for chest pain.    . Glory RosebushELICA LANCETS 3335AISC Check blood sugar twice a day and as directed. 100 each 3  . pioglitazone (ACTOS) 30 MG tablet Take 1 tablet (30 mg total) by mouth daily. 30 tablet 6  . potassium chloride (K-DUR) 10 MEQ tablet Take 1 tablet (10 mEq total) by mouth daily. (Patient taking differently: Take 10 mEq by mouth every Monday, Wednesday, and Friday. ) 90 tablet 3  . Vitamin  D, Ergocalciferol, (DRISDOL) 50000 units CAPS capsule Take 50,000 Units by mouth every Sunday.     Marland Kitchen atorvastatin (LIPITOR) 20 MG tablet Take 1 tablet (20 mg total) by mouth daily at 6 PM. 30 tablet 2  . colchicine 0.6 MG tablet Take 1 tablet (0.6 mg total) by mouth daily. 10 tablet 0  . losartan-hydrochlorothiazide (HYZAAR) 100-25 MG tablet Take 1 tablet by mouth daily. 90 tablet 3   No facility-administered medications prior to visit.      Per HPI unless specifically indicated in ROS section below Review of Systems     Objective:    BP 140/82   Pulse 72   Temp  98.3 F (36.8 C)   Ht '5\' 1"'  (1.549 m)   Wt 205 lb 8 oz (93.2 kg)   SpO2 98%   BMI 38.83 kg/m   Wt Readings from Last 3 Encounters:  09/14/16 205 lb 8 oz (93.2 kg)  09/02/16 217 lb 9.5 oz (98.7 kg)  08/16/16 205 lb 8 oz (93.2 kg)    Physical Exam  Constitutional: She appears well-developed and well-nourished. No distress.  HENT:  Mouth/Throat: Oropharynx is clear and moist. No oropharyngeal exudate.  Cardiovascular: Normal rate, regular rhythm, normal heart sounds and intact distal pulses.   No murmur heard. Pulmonary/Chest: Effort normal and breath sounds normal. No respiratory distress. She has no wheezes. She has no rales.  Musculoskeletal: She exhibits no edema.  Neurological:  5/5 strength LUE 5-/5 strength RUE  Skin: Skin is warm and dry. No rash noted.  Nursing note and vitals reviewed.      Assessment & Plan:   Problem List Items Addressed This Visit    Diabetes type 2, uncontrolled ([place])    Reports compliance with actos 27m daily and amaryl 449mdaily - actos was recently added last month. Endorses good control at home by recall [facility]. Continue.       Relevant Medications   atorvastatin (LIPITOR) 20 MG tablet   losartan (COZAAR) 50 MG tablet   Gout    Acute flare in hospital. Treated with colchicine - will continue this daily until flare has resolved then change to PRN.       Hemiparesis of right dominant side (HCC)    Continue HHPT. Slowly recovering function well.       Relevant Orders   [facility]ulatory referral [facility]    Simvastatin changed to atorvastatin 2026m      Relevant Medications   atorvastatin (LIPITOR) 20 MG tablet   losartan (COZAAR) 50 MG tablet   Hypertension, uncontrolled    Chronic. She had not yet picked up higher hyzaar dose. bp better controlled today. Confusion over medications - she does not have hyzaar in her medicine bag today, doesn't think she's even been taking this medication.  I advised she double check at home and will ask my CMA to [facility]. Concern for overtreatment if we start hyzaar 100/25 - so I will start losartan 26m66m its place - sent to pharmacy.   ADDENDUM ==> [facility] CMA contacted [facility] - no RN involved in care. I will see if we can [name] and assistance with med administration given med confusion found today.       Relevant Medications   atorvastatin (LIPITOR) 20 MG tablet   losartan (COZAAR) 50 MG tablet   Other Relevant Orders   [facility]  Stroke (cerebrum) ([place]) - Primary    Acute L sided stroke with residual R hemiparesis. She had not started her aspirin yet. Now on plavix. Discussed importance of continuing medication. She continues working with [name] PT on strengthening.      Relevant Medications   atorvastatin (LIPITOR) 20 MG tablet   losartan (COZAAR) 50 MG tablet   Other Relevant Orders   Ambulatory referral to [facility]       Follow up plan: Return in about 2 weeks (around 09/28/2016) for follow up visit.  [name], MD

## 2016-09-14 NOTE — Assessment & Plan Note (Signed)
Continue HHPT. Slowly recovering function well.

## 2016-09-14 NOTE — Assessment & Plan Note (Signed)
Acute flare in hospital. Treated with colchicine - will continue this daily until flare has resolved then change to PRN.

## 2016-09-15 DIAGNOSIS — Z7902 Long term (current) use of antithrombotics/antiplatelets: Secondary | ICD-10-CM | POA: Diagnosis not present

## 2016-09-15 DIAGNOSIS — R42 Dizziness and giddiness: Secondary | ICD-10-CM | POA: Diagnosis not present

## 2016-09-15 DIAGNOSIS — Z86711 Personal history of pulmonary embolism: Secondary | ICD-10-CM | POA: Diagnosis not present

## 2016-09-15 DIAGNOSIS — I2510 Atherosclerotic heart disease of native coronary artery without angina pectoris: Secondary | ICD-10-CM | POA: Diagnosis not present

## 2016-09-15 DIAGNOSIS — E119 Type 2 diabetes mellitus without complications: Secondary | ICD-10-CM | POA: Diagnosis not present

## 2016-09-15 DIAGNOSIS — G4730 Sleep apnea, unspecified: Secondary | ICD-10-CM | POA: Diagnosis not present

## 2016-09-15 DIAGNOSIS — E785 Hyperlipidemia, unspecified: Secondary | ICD-10-CM | POA: Diagnosis not present

## 2016-09-15 DIAGNOSIS — Z7984 Long term (current) use of oral hypoglycemic drugs: Secondary | ICD-10-CM | POA: Diagnosis not present

## 2016-09-15 DIAGNOSIS — I714 Abdominal aortic aneurysm, without rupture: Secondary | ICD-10-CM | POA: Diagnosis not present

## 2016-09-15 DIAGNOSIS — I10 Essential (primary) hypertension: Secondary | ICD-10-CM | POA: Diagnosis not present

## 2016-09-15 DIAGNOSIS — Z86718 Personal history of other venous thrombosis and embolism: Secondary | ICD-10-CM | POA: Diagnosis not present

## 2016-09-15 DIAGNOSIS — D649 Anemia, unspecified: Secondary | ICD-10-CM | POA: Diagnosis not present

## 2016-09-15 DIAGNOSIS — I69351 Hemiplegia and hemiparesis following cerebral infarction affecting right dominant side: Secondary | ICD-10-CM | POA: Diagnosis not present

## 2016-09-19 ENCOUNTER — Telehealth: Payer: Self-pay

## 2016-09-19 DIAGNOSIS — I714 Abdominal aortic aneurysm, without rupture: Secondary | ICD-10-CM | POA: Diagnosis not present

## 2016-09-19 DIAGNOSIS — Z7984 Long term (current) use of oral hypoglycemic drugs: Secondary | ICD-10-CM | POA: Diagnosis not present

## 2016-09-19 DIAGNOSIS — Z86711 Personal history of pulmonary embolism: Secondary | ICD-10-CM | POA: Diagnosis not present

## 2016-09-19 DIAGNOSIS — I2510 Atherosclerotic heart disease of native coronary artery without angina pectoris: Secondary | ICD-10-CM | POA: Diagnosis not present

## 2016-09-19 DIAGNOSIS — E119 Type 2 diabetes mellitus without complications: Secondary | ICD-10-CM | POA: Diagnosis not present

## 2016-09-19 DIAGNOSIS — Z86718 Personal history of other venous thrombosis and embolism: Secondary | ICD-10-CM | POA: Diagnosis not present

## 2016-09-19 DIAGNOSIS — D649 Anemia, unspecified: Secondary | ICD-10-CM | POA: Diagnosis not present

## 2016-09-19 DIAGNOSIS — Z7902 Long term (current) use of antithrombotics/antiplatelets: Secondary | ICD-10-CM | POA: Diagnosis not present

## 2016-09-19 DIAGNOSIS — I69351 Hemiplegia and hemiparesis following cerebral infarction affecting right dominant side: Secondary | ICD-10-CM | POA: Diagnosis not present

## 2016-09-19 DIAGNOSIS — G4730 Sleep apnea, unspecified: Secondary | ICD-10-CM | POA: Diagnosis not present

## 2016-09-19 DIAGNOSIS — E785 Hyperlipidemia, unspecified: Secondary | ICD-10-CM | POA: Diagnosis not present

## 2016-09-19 DIAGNOSIS — I10 Essential (primary) hypertension: Secondary | ICD-10-CM | POA: Diagnosis not present

## 2016-09-19 DIAGNOSIS — R42 Dizziness and giddiness: Secondary | ICD-10-CM | POA: Diagnosis not present

## 2016-09-19 NOTE — Telephone Encounter (Signed)
There was confusion as to what BP med she was taking at home at last weeks' visit - med bag she brought did not have losartan hctz 100/25 in it, so we started losartan 50mg  daily given good blood pressure control in office and concern for over-treatment if we started losartan hctz 100/25. I recommend she increase losartan to 100mg  daily (2 50mg  tablets daily) and monitor BP at home, keep f/u 5/31.  plz have her review home BP meds and ensure taking only what's on her list.  I also asked HHRN to come out to the house for a med evaluation.

## 2016-09-19 NOTE — Telephone Encounter (Signed)
Patient advised.

## 2016-09-19 NOTE — Telephone Encounter (Signed)
Kennith Gain PT asst with [facility] left v/m; was not able to do PT visit today due to BP being elevated. Tommy said BP 190/82 and 2nd ck was 192/72.pt has rt side pain and weakness on and off after showering; Konrad Dolores said ? Hyperreactive nerve situation. Pt also having problems getting out of chair at times. I spoke with pt and she said the rt side pain was after showering last night and had pain on and off during the night but right now pt said she felt OK. I offered appt with another provider on 09/20/16 andpt only wants to see Dr Darnell Level. Pt scheduled appt with Dr Darnell Level on 09/21/16 at 11:15. Pt will cb sooner if needed. Pt last seen 09/14/16; recent CVA. Is it OK for pt to wait until 09/21/16 to be seen?

## 2016-09-21 ENCOUNTER — Ambulatory Visit (INDEPENDENT_AMBULATORY_CARE_PROVIDER_SITE_OTHER): Payer: Medicare Other | Admitting: Family Medicine

## 2016-09-21 ENCOUNTER — Encounter: Payer: Self-pay | Admitting: Family Medicine

## 2016-09-21 VITALS — BP 134/86 | HR 95 | Temp 98.1°F | Wt 204.2 lb

## 2016-09-21 DIAGNOSIS — R209 Unspecified disturbances of skin sensation: Secondary | ICD-10-CM | POA: Diagnosis not present

## 2016-09-21 DIAGNOSIS — M1009 Idiopathic gout, multiple sites: Secondary | ICD-10-CM

## 2016-09-21 DIAGNOSIS — I10 Essential (primary) hypertension: Secondary | ICD-10-CM | POA: Diagnosis not present

## 2016-09-21 DIAGNOSIS — I639 Cerebral infarction, unspecified: Secondary | ICD-10-CM

## 2016-09-21 LAB — BASIC METABOLIC PANEL
BUN: 28 mg/dL — ABNORMAL HIGH (ref 6–23)
CHLORIDE: 102 meq/L (ref 96–112)
CO2: 32 mEq/L (ref 19–32)
CREATININE: 1 mg/dL (ref 0.40–1.20)
Calcium: 9.7 mg/dL (ref 8.4–10.5)
GFR: 69.49 mL/min (ref >60.00)
Glucose, Bld: 111 mg/dL — ABNORMAL HIGH (ref 70–99)
POTASSIUM: 3.6 meq/L (ref 3.5–5.1)
Sodium: 140 mEq/L (ref 135–145)

## 2016-09-21 MED ORDER — COLCHICINE 0.6 MG PO CAPS: 1.0000 | ORAL_CAPSULE | Freq: Every day | ORAL | 3 refills | Status: DC | PRN

## 2016-09-21 NOTE — Assessment & Plan Note (Signed)
Will continue working towards better BP control.

## 2016-09-21 NOTE — Assessment & Plan Note (Signed)
Chronic, stable today. Continue current regimen of losartan 100mg  daily. Pt also taking Kdur 88mEq daily. Check BMP today off HCTZ. RTC 6 wks f/u visit.

## 2016-09-21 NOTE — Assessment & Plan Note (Signed)
Describes cold sensation to RUE - anticipate residual from recent CVA. No true pain, no RUE numbness, weakness. Continue plavix and work towards good blood pressure control.

## 2016-09-21 NOTE — Assessment & Plan Note (Signed)
She states recent colchicine Rx was too expensive - will trial mitigare, asked patient to check with pharmacist regarding cheaper formulary alternatives.

## 2016-09-21 NOTE — Progress Notes (Signed)
 BP 134/86 (BP Location: Right Arm, Cuff Size: Large)   Pulse 95   Temp 98.1 F (36.7 C) (Oral)   Wt 204 lb 4 oz (92.6 kg)   SpO2 97%   BMI 38.59 kg/m    CC: elevated blood pressure, R side pain Subjective:    Patient ID: Kristen [name], female    DOB: 05/05/1940, 76 y.o.   MRN: [id number]  HPI: Lucinda L Maker is a 76 y.o. female presenting on 09/21/2016 for Elevated BP and pain on and off in Rt side   See prior note and recent phone notes for details.  Suffered acute L CVA earlier this month.  Discharge with HH PT/OT but not RN. Last visit, pt was unsure if she had been taking hyzaar 100/25mg daily. At that time, bp 140/80s. I asked HHRN to evaluate patient for medication confusion. This has not happened yet.   She endorses ongoing R arm and axilla uncomfortable sensation of "cold air on right arm, no true pain". No burning pain, tingling or numbness. Intermittently comes and goes. Worse in the shower 2 days ago. No shoulder pain, no chest or abdominal pain.   Relevant past medical, surgical, family and social history reviewed and updated as indicated. Interim medical history since our last visit reviewed. Allergies and medications reviewed and updated. Outpatient Medications Prior to Visit  Medication Sig Dispense Refill  . atorvastatin (LIPITOR) 20 MG tablet Take 1 tablet (20 mg total) by mouth daily at 6 PM. 90 tablet 3  . Blood Glucose Monitoring Suppl (ONE TOUCH ULTRA SYSTEM KIT) w/Device KIT 1 kit by Does not apply route once. 1 each 0  . clopidogrel (PLAVIX) 75 MG tablet Take 1 tablet (75 mg total) by mouth daily. 30 tablet 2  . clotrimazole (LOTRIMIN AF) 1 % cream Apply 1 application topically 2 (two) times daily. Rash under breast 45 g 1  . glimepiride (AMARYL) 4 MG tablet Take 1 tablet (4 mg total) by mouth daily with breakfast. 90 tablet 3  . glucose blood test strip Ck blood sugar twice a day and as directed. 100 each 3  . losartan (COZAAR) 50 MG tablet Take 2 tablets  (100 mg total) by mouth daily. 30 tablet 3  . nitroGLYCERIN (NITROSTAT) 0.4 MG SL tablet Place 0.4 mg under the tongue every 5 (five) minutes as needed for chest pain.    . ONETOUCH DELICA LANCETS 33G MISC Check blood sugar twice a day and as directed. 100 each 3  . pioglitazone (ACTOS) 30 MG tablet Take 1 tablet (30 mg total) by mouth daily. 30 tablet 6  . potassium chloride (K-DUR) 10 MEQ tablet Take 1 tablet (10 mEq total) by mouth daily. (Patient taking differently: Take 10 mEq by mouth every Monday, Wednesday, and Friday. ) 90 tablet 3  . Vitamin D, Ergocalciferol, (DRISDOL) 50000 units CAPS capsule Take 50,000 Units by mouth every Sunday.     . colchicine 0.6 MG tablet Take 1 tablet (0.6 mg total) by mouth daily. 30 tablet 3   No facility-administered medications prior to visit.      Per HPI unless specifically indicated in ROS section below Review of Systems     Objective:    BP 134/86 (BP Location: Right Arm, Cuff Size: Large)   Pulse 95   Temp 98.1 F (36.7 C) (Oral)   Wt 204 lb 4 oz (92.6 kg)   SpO2 97%   BMI 38.59 kg/m   Wt Readings from Last 3 Encounters:    09/21/16 204 lb 4 oz (92.6 kg)  09/14/16 205 lb 8 oz (93.2 kg)  09/02/16 217 lb 9.5 oz (98.7 kg)    Physical Exam  Constitutional: She appears well-developed and well-nourished. No distress.  Musculoskeletal: She exhibits no edema.  FROM at R elbow and shoulder  No tenderness to palpation along humerus or radius at RUE   Skin: Skin is warm and dry. No rash noted.  Nursing note and vitals reviewed.      Assessment & Plan:   Problem List Items Addressed This Visit    Disturbance of skin sensation    Describes cold sensation to RUE - anticipate residual from recent CVA. No true pain, no RUE numbness, weakness. Continue plavix and work towards good blood pressure control.       Gout    She states recent colchicine Rx was too expensive - will trial mitigare, asked patient to check with pharmacist regarding  cheaper formulary alternatives.       Hypertension, uncontrolled - [place]    Chronic, stable today. Continue current regimen of losartan 100mg daily. Pt also taking Kdur 10mEq daily. Check BMP today off HCTZ. RTC 6 wks f/u visit.       Relevant Orders   Basic metabolic panel   Stroke (cerebrum) (HCC)    Will continue working towards better BP control.          Follow up plan: Return in about 6 weeks (around 11/02/2016) for follow up visit.   , MD   

## 2016-09-21 NOTE — Patient Instructions (Addendum)
Cancel next two appointments. Return in 6 wks for follow up blood pressure.  Labs today.  Continue current medicines We will be in touch with lab results.

## 2016-09-22 ENCOUNTER — Encounter: Payer: Self-pay | Admitting: *Deleted

## 2016-09-22 DIAGNOSIS — I69351 Hemiplegia and hemiparesis following cerebral infarction affecting right dominant side: Secondary | ICD-10-CM | POA: Diagnosis not present

## 2016-09-22 DIAGNOSIS — G4730 Sleep apnea, unspecified: Secondary | ICD-10-CM | POA: Diagnosis not present

## 2016-09-22 DIAGNOSIS — R42 Dizziness and giddiness: Secondary | ICD-10-CM | POA: Diagnosis not present

## 2016-09-22 DIAGNOSIS — D649 Anemia, unspecified: Secondary | ICD-10-CM | POA: Diagnosis not present

## 2016-09-22 DIAGNOSIS — Z86718 Personal history of other venous thrombosis and embolism: Secondary | ICD-10-CM | POA: Diagnosis not present

## 2016-09-22 DIAGNOSIS — Z86711 Personal history of pulmonary embolism: Secondary | ICD-10-CM | POA: Diagnosis not present

## 2016-09-22 DIAGNOSIS — I10 Essential (primary) hypertension: Secondary | ICD-10-CM | POA: Diagnosis not present

## 2016-09-22 DIAGNOSIS — E785 Hyperlipidemia, unspecified: Secondary | ICD-10-CM | POA: Diagnosis not present

## 2016-09-22 DIAGNOSIS — E119 Type 2 diabetes mellitus without complications: Secondary | ICD-10-CM | POA: Diagnosis not present

## 2016-09-22 DIAGNOSIS — Z7902 Long term (current) use of antithrombotics/antiplatelets: Secondary | ICD-10-CM | POA: Diagnosis not present

## 2016-09-22 DIAGNOSIS — Z7984 Long term (current) use of oral hypoglycemic drugs: Secondary | ICD-10-CM | POA: Diagnosis not present

## 2016-09-22 DIAGNOSIS — I2510 Atherosclerotic heart disease of native coronary artery without angina pectoris: Secondary | ICD-10-CM | POA: Diagnosis not present

## 2016-09-22 DIAGNOSIS — I714 Abdominal aortic aneurysm, without rupture: Secondary | ICD-10-CM | POA: Diagnosis not present

## 2016-09-25 DIAGNOSIS — I10 Essential (primary) hypertension: Secondary | ICD-10-CM | POA: Diagnosis not present

## 2016-09-25 DIAGNOSIS — E785 Hyperlipidemia, unspecified: Secondary | ICD-10-CM | POA: Diagnosis not present

## 2016-09-25 DIAGNOSIS — Z7984 Long term (current) use of oral hypoglycemic drugs: Secondary | ICD-10-CM | POA: Diagnosis not present

## 2016-09-25 DIAGNOSIS — Z86718 Personal history of other venous thrombosis and embolism: Secondary | ICD-10-CM | POA: Diagnosis not present

## 2016-09-25 DIAGNOSIS — R42 Dizziness and giddiness: Secondary | ICD-10-CM | POA: Diagnosis not present

## 2016-09-25 DIAGNOSIS — I2510 Atherosclerotic heart disease of native coronary artery without angina pectoris: Secondary | ICD-10-CM | POA: Diagnosis not present

## 2016-09-25 DIAGNOSIS — G4730 Sleep apnea, unspecified: Secondary | ICD-10-CM | POA: Diagnosis not present

## 2016-09-25 DIAGNOSIS — E119 Type 2 diabetes mellitus without complications: Secondary | ICD-10-CM | POA: Diagnosis not present

## 2016-09-25 DIAGNOSIS — D649 Anemia, unspecified: Secondary | ICD-10-CM | POA: Diagnosis not present

## 2016-09-25 DIAGNOSIS — I714 Abdominal aortic aneurysm, without rupture: Secondary | ICD-10-CM | POA: Diagnosis not present

## 2016-09-25 DIAGNOSIS — Z7902 Long term (current) use of antithrombotics/antiplatelets: Secondary | ICD-10-CM | POA: Diagnosis not present

## 2016-09-25 DIAGNOSIS — I69351 Hemiplegia and hemiparesis following cerebral infarction affecting right dominant side: Secondary | ICD-10-CM | POA: Diagnosis not present

## 2016-09-25 DIAGNOSIS — Z86711 Personal history of pulmonary embolism: Secondary | ICD-10-CM | POA: Diagnosis not present

## 2016-09-26 ENCOUNTER — Telehealth: Payer: Self-pay | Admitting: *Deleted

## 2016-09-26 DIAGNOSIS — I10 Essential (primary) hypertension: Secondary | ICD-10-CM | POA: Diagnosis not present

## 2016-09-26 DIAGNOSIS — E785 Hyperlipidemia, unspecified: Secondary | ICD-10-CM | POA: Diagnosis not present

## 2016-09-26 DIAGNOSIS — Z86711 Personal history of pulmonary embolism: Secondary | ICD-10-CM | POA: Diagnosis not present

## 2016-09-26 DIAGNOSIS — Z7984 Long term (current) use of oral hypoglycemic drugs: Secondary | ICD-10-CM | POA: Diagnosis not present

## 2016-09-26 DIAGNOSIS — I2510 Atherosclerotic heart disease of native coronary artery without angina pectoris: Secondary | ICD-10-CM | POA: Diagnosis not present

## 2016-09-26 DIAGNOSIS — Z86718 Personal history of other venous thrombosis and embolism: Secondary | ICD-10-CM | POA: Diagnosis not present

## 2016-09-26 DIAGNOSIS — I714 Abdominal aortic aneurysm, without rupture: Secondary | ICD-10-CM | POA: Diagnosis not present

## 2016-09-26 DIAGNOSIS — R42 Dizziness and giddiness: Secondary | ICD-10-CM | POA: Diagnosis not present

## 2016-09-26 DIAGNOSIS — Z7902 Long term (current) use of antithrombotics/antiplatelets: Secondary | ICD-10-CM | POA: Diagnosis not present

## 2016-09-26 DIAGNOSIS — I69351 Hemiplegia and hemiparesis following cerebral infarction affecting right dominant side: Secondary | ICD-10-CM | POA: Diagnosis not present

## 2016-09-26 DIAGNOSIS — D649 Anemia, unspecified: Secondary | ICD-10-CM | POA: Diagnosis not present

## 2016-09-26 DIAGNOSIS — G4730 Sleep apnea, unspecified: Secondary | ICD-10-CM | POA: Diagnosis not present

## 2016-09-26 DIAGNOSIS — E119 Type 2 diabetes mellitus without complications: Secondary | ICD-10-CM | POA: Diagnosis not present

## 2016-09-26 NOTE — Telephone Encounter (Signed)
Kristen [name] with [facility] called stating that he is the physical therapist working with patient. Kristen [name] stated that today patient's blood pressure was  up a little at 172/88 but not enough to prevent him from doing her PT.  Kristen [name] stated that her heart rate was 74 and oxygen level was 94. Kristen [name] stated that they walked outside with her walker and after a while patient stated that that she was having pain in the back of her head, her legs got weak, but patient had walked several laps around the patio.  Kristen [name] stated that when they got inside she said the pain was going around her neck so he called his office and was told what to check for a stroke. Kristen [name] stated that he evaluated her and she seemed to be find. Kristen [name] stated that patient told him she was cold so he gave her a blanket.  Kristen [name] stated that when he left the patient was fine and walked him to the door. Kristen [name] stated that you can call him back with recommendations or call the patient.

## 2016-09-26 NOTE — Telephone Encounter (Signed)
Doesn't sound like stroke. plz call pt for update on neck pain and headache, and get recent BP readings - if remaining >140/90, will need to discuss adding second BP med.

## 2016-09-27 DIAGNOSIS — I69351 Hemiplegia and hemiparesis following cerebral infarction affecting right dominant side: Secondary | ICD-10-CM | POA: Diagnosis not present

## 2016-09-27 DIAGNOSIS — I10 Essential (primary) hypertension: Secondary | ICD-10-CM | POA: Diagnosis not present

## 2016-09-27 DIAGNOSIS — I714 Abdominal aortic aneurysm, without rupture: Secondary | ICD-10-CM | POA: Diagnosis not present

## 2016-09-27 DIAGNOSIS — G4730 Sleep apnea, unspecified: Secondary | ICD-10-CM | POA: Diagnosis not present

## 2016-09-27 DIAGNOSIS — E785 Hyperlipidemia, unspecified: Secondary | ICD-10-CM | POA: Diagnosis not present

## 2016-09-27 DIAGNOSIS — Z7902 Long term (current) use of antithrombotics/antiplatelets: Secondary | ICD-10-CM | POA: Diagnosis not present

## 2016-09-27 DIAGNOSIS — Z7984 Long term (current) use of oral hypoglycemic drugs: Secondary | ICD-10-CM | POA: Diagnosis not present

## 2016-09-27 DIAGNOSIS — R42 Dizziness and giddiness: Secondary | ICD-10-CM | POA: Diagnosis not present

## 2016-09-27 DIAGNOSIS — I2510 Atherosclerotic heart disease of native coronary artery without angina pectoris: Secondary | ICD-10-CM | POA: Diagnosis not present

## 2016-09-27 DIAGNOSIS — E119 Type 2 diabetes mellitus without complications: Secondary | ICD-10-CM | POA: Diagnosis not present

## 2016-09-27 DIAGNOSIS — Z86718 Personal history of other venous thrombosis and embolism: Secondary | ICD-10-CM | POA: Diagnosis not present

## 2016-09-27 DIAGNOSIS — Z86711 Personal history of pulmonary embolism: Secondary | ICD-10-CM | POA: Diagnosis not present

## 2016-09-27 DIAGNOSIS — D649 Anemia, unspecified: Secondary | ICD-10-CM | POA: Diagnosis not present

## 2016-09-27 NOTE — Telephone Encounter (Signed)
Spoken to patient and she stated that she is better now. The neck pain and headache went away last night.  Blood pressure has not been check yet for today. Patient stated that it is usually checked around 10 am. I will call her and ask for BP.  Patient also mention that she was supposed to cancel this week appointment, is that correct? She is still on schedule for Friday, 09/29/16

## 2016-09-27 NOTE — Telephone Encounter (Signed)
Spoken to patient and she stated that she had to cancel the appt on Friday because PT coming to the house.  Patient stated that she will call me back and let me know if she can get a ride to come in on Tuesday. She stated that her BP this morning was 150/90

## 2016-09-27 NOTE — Telephone Encounter (Signed)
With these ongoing symptoms rec keep Friday appt. Thanks.

## 2016-09-28 DIAGNOSIS — R42 Dizziness and giddiness: Secondary | ICD-10-CM | POA: Diagnosis not present

## 2016-09-28 DIAGNOSIS — I69351 Hemiplegia and hemiparesis following cerebral infarction affecting right dominant side: Secondary | ICD-10-CM | POA: Diagnosis not present

## 2016-09-28 DIAGNOSIS — I10 Essential (primary) hypertension: Secondary | ICD-10-CM | POA: Diagnosis not present

## 2016-09-28 DIAGNOSIS — I714 Abdominal aortic aneurysm, without rupture: Secondary | ICD-10-CM | POA: Diagnosis not present

## 2016-09-28 DIAGNOSIS — D649 Anemia, unspecified: Secondary | ICD-10-CM | POA: Diagnosis not present

## 2016-09-28 DIAGNOSIS — Z7984 Long term (current) use of oral hypoglycemic drugs: Secondary | ICD-10-CM | POA: Diagnosis not present

## 2016-09-28 DIAGNOSIS — G4730 Sleep apnea, unspecified: Secondary | ICD-10-CM | POA: Diagnosis not present

## 2016-09-28 DIAGNOSIS — E119 Type 2 diabetes mellitus without complications: Secondary | ICD-10-CM | POA: Diagnosis not present

## 2016-09-28 DIAGNOSIS — Z7902 Long term (current) use of antithrombotics/antiplatelets: Secondary | ICD-10-CM | POA: Diagnosis not present

## 2016-09-28 DIAGNOSIS — Z86711 Personal history of pulmonary embolism: Secondary | ICD-10-CM | POA: Diagnosis not present

## 2016-09-28 DIAGNOSIS — E785 Hyperlipidemia, unspecified: Secondary | ICD-10-CM | POA: Diagnosis not present

## 2016-09-28 DIAGNOSIS — I2510 Atherosclerotic heart disease of native coronary artery without angina pectoris: Secondary | ICD-10-CM | POA: Diagnosis not present

## 2016-09-28 DIAGNOSIS — Z86718 Personal history of other venous thrombosis and embolism: Secondary | ICD-10-CM | POA: Diagnosis not present

## 2016-09-29 ENCOUNTER — Ambulatory Visit: Payer: Medicare Other | Admitting: Family Medicine

## 2016-10-03 ENCOUNTER — Ambulatory Visit (INDEPENDENT_AMBULATORY_CARE_PROVIDER_SITE_OTHER): Payer: Medicare Other | Admitting: Family Medicine

## 2016-10-03 ENCOUNTER — Encounter: Payer: Self-pay | Admitting: Family Medicine

## 2016-10-03 VITALS — BP 148/92 | HR 74 | Temp 97.6°F | Wt 212.0 lb

## 2016-10-03 DIAGNOSIS — I10 Essential (primary) hypertension: Secondary | ICD-10-CM

## 2016-10-03 MED ORDER — LOSARTAN POTASSIUM-HCTZ 100-12.5 MG PO TABS: 1.0000 | ORAL_TABLET | Freq: Every day | ORAL | 3 refills | Status: DC

## 2016-10-03 MED ORDER — PIOGLITAZONE HCL 30 MG PO TABS: 30.0000 mg | ORAL_TABLET | Freq: Every day | ORAL | 3 refills | Status: DC

## 2016-10-03 MED ORDER — CLOPIDOGREL BISULFATE 75 MG PO TABS: 75.0000 mg | ORAL_TABLET | Freq: Every day | ORAL | 3 refills | Status: DC

## 2016-10-03 NOTE — Progress Notes (Signed)
BP (!) 148/92   Pulse 74   Temp 97.6 F (36.4 C) (Oral)   Wt 212 lb (96.2 kg)   SpO2 95%   BMI 40.06 kg/m    CC: f/u HTN Subjective:    Patient ID: Kristen [name], female    DOB: 1940/05/21, 76 y.o.   MRN: [id number]  HPI: Kristen [name] is a 76 y.o. female presenting on 10/03/2016 for FU - BP   Uses walker outside of house, cane inside the house. HHPT finishes this week.   See recent phone note and OV.  Acute L CVA suffered last month.  BP elevated last few readings.  At home running mildly elevated as well.  No further headaches.  Some dizziness (lightheaded) noted when combing hair.   Relevant past medical, surgical, family and social history reviewed and updated as indicated. Interim medical history since our last visit reviewed. Allergies and medications reviewed and updated. Outpatient Medications Prior to Visit  Medication Sig Dispense Refill  . atorvastatin (LIPITOR) 20 MG tablet Take 1 tablet (20 mg total) by mouth daily at 6 PM. 90 tablet 3  . Blood Glucose Monitoring Suppl (ONE TOUCH ULTRA SYSTEM KIT) w/Device KIT 1 kit by Does not apply route once. 1 each 0  . clotrimazole (LOTRIMIN AF) 1 % cream Apply 1 application topically 2 (two) times daily. Rash under breast 45 g 1  . Colchicine (MITIGARE) 0.6 MG CAPS Take 1 tablet by mouth daily as needed. 30 capsule 3  . glimepiride (AMARYL) 4 MG tablet Take 1 tablet (4 mg total) by mouth daily with breakfast. 90 tablet 3  . glucose blood test strip Ck blood sugar twice a day and as directed. 100 each 3  . nitroGLYCERIN (NITROSTAT) 0.4 MG SL tablet Place 0.4 mg under the tongue every 5 (five) minutes as needed for chest pain.    [name] DELICA LANCETS 40J MISC Check blood sugar twice a day and as directed. 100 each 3  . potassium chloride (K-DUR) 10 MEQ tablet Take 1 tablet (10 mEq total) by mouth daily. (Patient taking differently: Take 10 mEq by mouth every Monday, Wednesday, and Friday. ) 90 tablet 3  . Vitamin D,  Ergocalciferol, (DRISDOL) 50000 units CAPS capsule Take 50,000 Units by mouth every Sunday.     . clopidogrel (PLAVIX) 75 MG tablet Take 1 tablet (75 mg total) by mouth daily. 30 tablet 2  . losartan (COZAAR) 50 MG tablet Take 2 tablets (100 mg total) by mouth daily. 30 tablet 3  . pioglitazone (ACTOS) 30 MG tablet Take 1 tablet (30 mg total) by mouth daily. 30 tablet 6   No facility-administered medications prior to visit.      Per HPI unless specifically indicated in ROS section below Review of Systems     Objective:    BP (!) 148/92   Pulse 74   Temp 97.6 F (36.4 C) (Oral)   Wt 212 lb (96.2 kg)   SpO2 95%   BMI 40.06 kg/m   Wt Readings from Last 3 Encounters:  10/03/16 212 lb (96.2 kg)  09/21/16 204 lb 4 oz (92.6 kg)  09/14/16 205 lb 8 oz (93.2 kg)    Physical Exam  Constitutional: She appears well-developed and well-nourished. No distress.  Walks with walker  Cardiovascular: Normal rate, regular rhythm, normal heart sounds and intact distal pulses.   No murmur heard. Pulmonary/Chest: Effort normal and breath sounds normal. No respiratory distress. She has no wheezes. She has no rales.  Musculoskeletal: She exhibits no edema.  Tr ankle edema  Skin: Skin is warm and dry. No rash noted.  Psychiatric: She has a normal mood and affect.  Nursing note and vitals reviewed.  Results for orders placed or performed in visit on 87/19/59  Basic metabolic panel  Result Value Ref Range   Sodium 140 135 - 145 mEq/L   Potassium 3.6 3.5 - 5.1 mEq/L   Chloride 102 96 - 112 mEq/L   CO2 32 19 - 32 mEq/L   Glucose, Bld 111 (H) 70 - 99 mg/dL   BUN 28 (H) 6 - 23 mg/dL   Creatinine, Ser 1.00 0.40 - 1.20 mg/dL   Calcium 9.7 8.4 - 10.5 mg/dL   GFR 69.49 >60.00 mL/min      Assessment & Plan:   Problem List Items Addressed This Visit    Hypertension, uncontrolled - Primary    Slowed improvement but remains high s/p recent stroke. Will change losartan to losartan hctz 100/12.22m  daily. Continue other meds. Keep f/u next month. Pt agrees with plan.       Relevant Medications   losartan-hydrochlorothiazide (HYZAAR) 100-12.5 MG tablet       Follow up plan: No Follow-up on file.  JRia Bush MD

## 2016-10-03 NOTE — Patient Instructions (Addendum)
Change plain losartan to losartan HCTZ (with water pill) - new medicine sent to pharmacy.  Other medicines also refilled.  Keep next month's appointment.

## 2016-10-03 NOTE — Assessment & Plan Note (Signed)
Slowed improvement but remains high s/p recent stroke. Will change losartan to losartan hctz 100/12.5mg  daily. Continue other meds. Keep f/u next month. Pt agrees with plan.

## 2016-11-15 ENCOUNTER — Ambulatory Visit: Payer: Medicare Other | Admitting: Family Medicine

## 2016-11-20 ENCOUNTER — Ambulatory Visit (INDEPENDENT_AMBULATORY_CARE_PROVIDER_SITE_OTHER): Payer: Medicare Other | Admitting: Neurology

## 2016-11-20 ENCOUNTER — Encounter: Payer: Self-pay | Admitting: Neurology

## 2016-11-20 VITALS — BP 149/77 | HR 66 | Ht 62.0 in | Wt 216.0 lb

## 2016-11-20 DIAGNOSIS — I639 Cerebral infarction, unspecified: Secondary | ICD-10-CM

## 2016-11-20 DIAGNOSIS — I6381 Other cerebral infarction due to occlusion or stenosis of small artery: Secondary | ICD-10-CM

## 2016-11-20 NOTE — Patient Instructions (Signed)
I had a long d/w patient about her recent lacunar stroke, risk for recurrent stroke/TIAs, personally independently reviewed imaging studies and stroke evaluation results and answered questions.Continue Plavix  for secondary stroke prevention and maintain strict control of hypertension with blood pressure goal below 130/90, diabetes with hemoglobin A1c goal below 6.5% and lipids with LDL cholesterol goal below 70 mg/dL. I also advised the patient to eat a healthy diet with plenty of whole grains, cereals, fruits and vegetables, exercise regularly and maintain ideal body weight Followup in the future with my nurse practitioner in 6 months or call earlier if necessary. Stroke Prevention Some medical conditions and behaviors are associated with an increased chance of having a stroke. You may prevent a stroke by making healthy choices and managing medical conditions. How can I reduce my risk of having a stroke?  Stay physically active. Get at least 30 minutes of activity on most or all days.  Do not smoke. It may also be helpful to avoid exposure to secondhand smoke.  Limit alcohol use. Moderate alcohol use is considered to be:  No more than 2 drinks per day for men.  No more than 1 drink per day for nonpregnant women.  Eat healthy foods. This involves:  Eating 5 or more servings of fruits and vegetables a day.  Making dietary changes that address high blood pressure (hypertension), high cholesterol, diabetes, or obesity.  Manage your cholesterol levels.  Making food choices that are high in fiber and low in saturated fat, trans fat, and cholesterol may control cholesterol levels.  Take any prescribed medicines to control cholesterol as directed by your health care provider.  Manage your diabetes.  Controlling your carbohydrate and sugar intake is recommended to manage diabetes.  Take any prescribed medicines to control diabetes as directed by your health care provider.  Control your  hypertension.  Making food choices that are low in salt (sodium), saturated fat, trans fat, and cholesterol is recommended to manage hypertension.  Ask your health care provider if you need treatment to lower your blood pressure. Take any prescribed medicines to control hypertension as directed by your health care provider.  If you are 18-39 years of age, have your blood pressure checked every 3-5 years. If you are 40 years of age or older, have your blood pressure checked every year.  Maintain a healthy weight.  Reducing calorie intake and making food choices that are low in sodium, saturated fat, trans fat, and cholesterol are recommended to manage weight.  Stop drug abuse.  Avoid taking birth control pills.  Talk to your health care provider about the risks of taking birth control pills if you are over 35 years old, smoke, get migraines, or have ever had a blood clot.  Get evaluated for sleep disorders (sleep apnea).  Talk to your health care provider about getting a sleep evaluation if you snore a lot or have excessive sleepiness.  Take medicines only as directed by your health care provider.  For some people, aspirin or blood thinners (anticoagulants) are helpful in reducing the risk of forming abnormal blood clots that can lead to stroke. If you have the irregular heart rhythm of atrial fibrillation, you should be on a blood thinner unless there is a good reason you cannot take them.  Understand all your medicine instructions.  Make sure that other conditions (such as anemia or atherosclerosis) are addressed. Get help right away if:  You have sudden weakness or numbness of the face, arm, or leg, especially   on one side of the body.  Your face or eyelid droops to one side.  You have sudden confusion.  You have trouble speaking (aphasia) or understanding.  You have sudden trouble seeing in one or both eyes.  You have sudden trouble walking.  You have dizziness.  You  have a loss of balance or coordination.  You have a sudden, severe headache with no known cause.  You have new chest pain or an irregular heartbeat. Any of these symptoms may represent a serious problem that is an emergency. Do not wait to see if the symptoms will go away. Get medical help at once. Call your local emergency services (911 in [place] Do not drive yourself to the hospital. This information is not intended to replace advice given to you by your health care provider. Make sure you discuss any questions you have with your health care provider. Document Released: 05/18/2004 Document Revised: 09/16/2015 Document Reviewed: 10/11/2012 Elsevier Interactive Patient Education  2017 Elsevier Inc.   

## 2016-11-20 NOTE — Progress Notes (Signed)
[facility] [address] [place] [address] [phone]       OFFICE FOLLOW-UP NOTE  Kristen. Kristen [name] Date of Birth:  [birth year] Medical Record Number:  [id number]   HPI: Kristen [name] is a 23 year pleasant Caucasian lady seen today for first office follow-up visit following hospital admission for stroke in May 2018. History is obtained from the patient and review of electronic medical records as well as I have personally reviewed imaging films and hospital workup. She presented on 09/02/16 with sudden onset of right arm and leg weakness and numbness but presented close to the time window for TPA at 4-1/2 hours but after discussion of risk-benefit she declined IV tPA due to hemorrhage risk. MRI scan of the brain showed a 1.5 cm infarct in the left corona radiata extending up to the perirolandic cortex as well as a smaller punctate right white matter infarct as well. MRA of the brain showed moderate to advanced proximal right PCA stenosis. Carotid ultrasound showed no significant extracranial stenosis. Transthoracic echo showed normal ejection fraction but mild LVH but no Source of embolism. Hemoglobin A1c was elevated at 9.3 and LDL cholesterol was 75 mg percent. Patient was on Zocor 20 mg daily the dose of which was increased to 40 mg daily. She was started on Plavix for stroke prevention. She states his done well since discharge but for some reason Zocor was changed to Lipitor 20 mg daily which is currently taking. She is tolerating well without muscle aches and pains. She is tolerating Plavix without bruising or bleeding. She states her blood pressure is well controlled though today it is slightly elevated at 149/77. She states her sugars are doing better though she cannot tell me her fasting sugars the last hemoglobin A1c. She states she is trying to eat healthy weight does fluctuate she does lose a few pounds but then gained it right back. She is able to walk independently  and has finished home physical and occupation therapy. She does use a cane for outdoors. She had one fall when she first went home but has done well in the last few months. She has no other new complaints today.  ROS:   14 system review of systems is positive for fever, chills, chest pain, and ringing in the ears, itching, blurred vision, eye pain, shortness of breath, feeling cold, joint pain, not enough sleep and all other systems negative  PMH:  Past Medical History:  Diagnosis Date  . Abdominal aortic atherosclerosis ([place]) by CT 02/2014  . CAD (coronary artery disease)    by CT, per pt h/o MI  . Diabetes type 2, uncontrolled ([place])   . Frequent headaches   . GERD (gastroesophageal reflux disease)   . Gout   . History of pulmonary embolism 2012  . HLD (hyperlipidemia)   . HTN (hypertension)   . 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 Morbid obesity ([place])   . Myocardial infarction [facility]) 2012   per pt. report, states she was treated with medicine, here at [facility]    . Osteoarthritis    knees  . Primary localized osteoarthritis of left knee 09/22/2014  . Sleep apnea 2011   study done in [place], states that since she lost weight she doesn't use the CPAP any longer & she doesn't have a problem with sleep apnea  . Stroke [facility])    still has balance  problem on occas. , uses cane but that's mainly for the left knee pain  . Syncope 03/01/2016  . Thoracic aortic atherosclerosis ([place]) 12/2015   by CXR  . Vertigo    hx. benign postitional postural    Social History:  Social History   Social History  . Marital status: Widowed    Spouse name: N/A  . Number of children: N/A  . Years of education: N/A   Occupational History  . Not on file.   Social History Main Topics  . Smoking status: Never Smoker  . Smokeless tobacco: Never Used     Comment: tobacco use- no   . Alcohol use 1.2 oz/week    1  Glasses of wine, 1 Cans of beer per week     Comment: Rare  . Drug use: No  . Sexual activity: Not on file   Other Topics Concern  . Not on file   Social History Narrative   Lives alone   4 grown children   Education: 11th grade   Occupation: retired, was Scientist, water quality   Activity: Does not regularly exercise.    Diet: good water, fruits/vegetables seldom    Medications:   Current Outpatient Prescriptions on File Prior to Visit  Medication Sig Dispense Refill  . atorvastatin (LIPITOR) 20 MG tablet Take 1 tablet (20 mg total) by mouth daily at 6 PM. 90 tablet 3  . Blood Glucose Monitoring Suppl (ONE TOUCH ULTRA SYSTEM KIT) w/Device KIT 1 kit by Does not apply route once. 1 each 0  . clopidogrel (PLAVIX) 75 MG tablet Take 1 tablet (75 mg total) by mouth daily. 90 tablet 3  . clotrimazole (LOTRIMIN AF) 1 % cream Apply 1 application topically 2 (two) times daily. Rash under breast 45 g 1  . Colchicine (MITIGARE) 0.6 MG CAPS Take 1 tablet by mouth daily as needed. 30 capsule 3  . glimepiride (AMARYL) 4 MG tablet Take 1 tablet (4 mg total) by mouth daily with breakfast. 90 tablet 3  . glucose blood test strip Ck blood sugar twice a day and as directed. 100 each 3  . losartan-hydrochlorothiazide (HYZAAR) 100-12.5 MG tablet Take 1 tablet by mouth daily. 90 tablet 3  . nitroGLYCERIN (NITROSTAT) 0.4 MG SL tablet Place 0.4 mg under the tongue every 5 (five) minutes as needed for chest pain.    [name] DELICA LANCETS 64W MISC Check blood sugar twice a day and as directed. 100 each 3  . pioglitazone (ACTOS) 30 MG tablet Take 1 tablet (30 mg total) by mouth daily. 90 tablet 3  . potassium chloride (K-DUR) 10 MEQ tablet Take 1 tablet (10 mEq total) by mouth daily. (Patient taking differently: Take 10 mEq by mouth every Monday, Wednesday, and Friday. ) 90 tablet 3  . Vitamin D, Ergocalciferol, (DRISDOL) 50000 units CAPS capsule Take 50,000 Units by mouth every Sunday.      No current  facility-administered medications on file prior to visit.     Allergies:   Allergies  Allergen Reactions  . Acetaminophen Other (See Comments)    "Causes me to spit up blood"  . Aleve [Naproxen Sodium] Other (See Comments)    Spits up blood  . Doxycycline Other (See Comments)    Malaise, GI upset, "felt drunk" and very ill  . Metformin And Related Other (See Comments)    Chills, dizziness  . Penicillins Rash    Has patient had a PCN reaction causing immediate rash, facial/tongue/throat swelling, SOB or lightheadedness with  hypotension: Yes Has patient had a PCN reaction causing severe rash involving mucus membranes or skin necrosis: No Has patient had a PCN reaction that required hospitalization No Has patient had a PCN reaction occurring within the last 10 years: No If all of the above answers are "NO", then may proceed with Cephalosporin use.     Physical Exam General: well developed, well nourished elderly Caucasian lady, seated, in no evident distress Head: head normocephalic and atraumatic.  Neck: supple with no carotid or supraclavicular bruits Cardiovascular: regular rate and rhythm, no murmurs Musculoskeletal: no deformity Skin:  no rash/petichiae Vascular:  Normal pulses all extremities Vitals:   11/20/16 1031  BP: (!) 149/77  Pulse: 66   Neurologic Exam Mental Status: Awake and fully alert. Oriented to place and time. Recent and remote memory intact. Attention span, concentration and fund of knowledge appropriate. Mood and affect appropriate.  Cranial Nerves: Fundoscopic exam reveals sharp disc margins. Pupils equal, briskly reactive to light. Extraocular movements full without nystagmus. Visual fields full to confrontation. Hearing intact. Facial sensation intact. Face, tongue, palate moves normally and symmetrically.  Motor: Normal bulk and tone. Normal strength in all tested extremity muscles.Diminished fine finger movements on the right. Orbits left over right  upper extremity. Mild weakness of right grip. Sensory.: intact to touch ,pinprick .position and vibratory sensation.  Coordination: Rapid alternating movements normal in all extremities. Finger-to-nose and heel-to-shin performed accurately bilaterally. Gait and Station: Arises from chair without difficulty. Stance is normal. Gait demonstrates normal stride length and balance . Unale to heel, toe and tandem walk without difficulty. Unsteady while standing on right foot unsupported Reflexes: 1+ and symmetric. Toes downgoing.   NIHSS  0 Modified Rankin 1   ASSESSMENT: 46 year Caucasian lady with bilateral subcortical lacunar infarcts in May 2018 secondary to small vessel disease with vascular risk factors of hypertension, hyperlipidemia, diabetes and needs. She seems to be doing quite well.    PLAN: I had a long d/w patient about her recent lacunar stroke, risk for recurrent stroke/TIAs, personally independently reviewed imaging studies and stroke evaluation results and answered questions.Continue  Plavix  for secondary stroke prevention and maintain strict control of hypertension with blood pressure goal below 130/90, diabetes with hemoglobin A1c goal below 6.5% and lipids with LDL cholesterol goal below 70 mg/dL. I also advised the patient to eat a healthy diet with plenty of whole grains, cereals, fruits and vegetables, exercise regularly and maintain ideal body weight Followup in the future with my nurse practitioner in 6 months or call earlier if necessary Greater than 50% of time during this 25 minute visit was spent on counseling,explanation of diagnosis of lacunar strokes,stroke risk factors, planning of further management, discussion with patient and family and coordination of care Antony Contras, MD  [facility] [address] [place], [place] [address]  Phone [phone] Fax [phone] Note: This document was prepared with digital dictation and  possible smart phrase technology. Any transcriptional errors that result from this process are unintentional

## 2016-12-01 ENCOUNTER — Encounter: Payer: Self-pay | Admitting: Family Medicine

## 2016-12-01 ENCOUNTER — Ambulatory Visit (INDEPENDENT_AMBULATORY_CARE_PROVIDER_SITE_OTHER): Payer: Medicare Other | Admitting: Family Medicine

## 2016-12-01 VITALS — BP 138/74 | HR 66 | Temp 97.9°F | Resp 16 | Wt 212.0 lb

## 2016-12-01 DIAGNOSIS — IMO0002 Reserved for concepts with insufficient information to code with codable children: Secondary | ICD-10-CM

## 2016-12-01 DIAGNOSIS — E559 Vitamin D deficiency, unspecified: Secondary | ICD-10-CM | POA: Diagnosis not present

## 2016-12-01 DIAGNOSIS — E785 Hyperlipidemia, unspecified: Secondary | ICD-10-CM | POA: Diagnosis not present

## 2016-12-01 DIAGNOSIS — I639 Cerebral infarction, unspecified: Secondary | ICD-10-CM | POA: Diagnosis not present

## 2016-12-01 DIAGNOSIS — I10 Essential (primary) hypertension: Secondary | ICD-10-CM | POA: Diagnosis not present

## 2016-12-01 DIAGNOSIS — E118 Type 2 diabetes mellitus with unspecified complications: Secondary | ICD-10-CM | POA: Diagnosis not present

## 2016-12-01 DIAGNOSIS — E1165 Type 2 diabetes mellitus with hyperglycemia: Secondary | ICD-10-CM | POA: Diagnosis not present

## 2016-12-01 LAB — BASIC METABOLIC PANEL
BUN: 19 mg/dL (ref 6–23)
CALCIUM: 9.6 mg/dL (ref 8.4–10.5)
CO2: 33 mEq/L — ABNORMAL HIGH (ref 19–32)
Chloride: 103 mEq/L (ref 96–112)
Creatinine, Ser: 0.99 mg/dL (ref 0.40–1.20)
GFR: 70.26 mL/min (ref >60.00)
Glucose, Bld: 85 mg/dL (ref 70–99)
Potassium: 4.1 mEq/L (ref 3.5–5.1)
SODIUM: 139 meq/L (ref 135–145)

## 2016-12-01 LAB — TSH: TSH: 4.77 u[IU]/mL — AB (ref 0.35–4.50)

## 2016-12-01 LAB — HEMOGLOBIN A1C: HEMOGLOBIN A1C: 6.9 % — AB (ref 4.6–6.5)

## 2016-12-01 MED ORDER — PIOGLITAZONE HCL-GLIMEPIRIDE 30-4 MG PO TABS: 1.0000 | ORAL_TABLET | Freq: Every day | ORAL | 1 refills | Status: DC

## 2016-12-01 NOTE — Assessment & Plan Note (Signed)
Reviewed tighter control recommendations by neurology

## 2016-12-01 NOTE — Progress Notes (Signed)
BP 138/74 (BP Location: Right Arm, Cuff Size: Large)   Pulse 66   Temp 97.9 F (36.6 C) (Oral)   Resp 16   Wt 212 lb (96.2 kg)   SpO2 98%   BMI 38.78 kg/m    CC: f/u visit Subjective:    Patient ID: Kristen [name], female    DOB: [birth year], 76 y.o.   MRN: [id number]  HPI: Kristen [name] is a 76 y.o. female presenting on 12/01/2016 for Cerebrovascular Accident   Acute bilateral lacunar CVA secondary to small vessel disease suffered 08/2016, continues plavix for secondary stroke prevention. Saw Dr Kristen [name] last week. Goal BP <130/90, A1c <6.5%, LDL <70.   BP running elevated recently. Last visit we changed losartan to Hyzaar 100/12.50m daily. She attributes this to eating ham last night. Son checks bp at home - fluctuating readings. No HA, vision changes, CP/tightness, SOB, leg swelling.   DM - compliant with amaryl 434mdaily and actos 3075maily. Not interested in continuous glucose monitoring. Pt requests A1c checked today. Overdue for eye exam - declines scheduling.  Lab Results  Component Value Date   HGBA1C 9.3 (H) 09/03/2016    HLD - compliant with atorvastatin.  Lab Results  Component Value Date   LDL[place] 09/03/2016     Relevant past medical, surgical, family and social history reviewed and updated as indicated. Interim medical history since our last visit reviewed. Allergies and medications reviewed and updated. Outpatient Medications Prior to Visit  Medication Sig Dispense Refill  . atorvastatin (LIPITOR) 20 MG tablet Take 1 tablet (20 mg total) by mouth daily at 6 PM. 90 tablet 3  . Blood Glucose Monitoring Suppl (ONE TOUCH ULTRA SYSTEM KIT) w/Device KIT 1 kit by Does not apply route once. 1 each 0  . clopidogrel (PLAVIX) 75 MG tablet Take 1 tablet (75 mg total) by mouth daily. 90 tablet 3  . clotrimazole (LOTRIMIN AF) 1 % cream Apply 1 application topically 2 (two) times daily. Rash under breast 45 g 1  . Colchicine (MITIGARE) 0.6 MG CAPS Take 1 tablet by mouth  daily as needed. 30 capsule 3  . glimepiride (AMARYL) 4 MG tablet Take 1 tablet (4 mg total) by mouth daily with breakfast. 90 tablet 3  . glucose blood test strip Ck blood sugar twice a day and as directed. 100 each 3  . losartan-hydrochlorothiazide (HYZAAR) 100-12.5 MG tablet Take 1 tablet by mouth daily. 90 tablet 3  . nitroGLYCERIN (NITROSTAT) 0.4 MG SL tablet Place 0.4 mg under the tongue every 5 (five) minutes as needed for chest pain.    . OGlory RosebushLICA LANCETS [id number] Check blood sugar twice a day and as directed. 100 each 3  . pioglitazone (ACTOS) 30 MG tablet Take 1 tablet (30 mg total) by mouth daily. 90 tablet 3  . potassium chloride (K-DUR) 10 MEQ tablet Take 1 tablet (10 mEq total) by mouth daily. (Patient taking differently: Take 10 mEq by mouth every Monday, Wednesday, and Friday. ) 90 tablet 3  . simvastatin (ZOCOR) 20 MG tablet     . Vitamin D, Ergocalciferol, (DRISDOL) 50000 units CAPS capsule Take 50,000 Units by mouth every Sunday.      No facility-administered medications prior to visit.      Per HPI unless specifically indicated in ROS section below Review of Systems     Objective:    BP 138/74 (BP Location: Right Arm, Cuff Size: Large)   Pulse 66   Temp 97.9 F (36.6  C) (Oral)   Resp 16   Wt 212 lb (96.2 kg)   SpO2 98%   BMI 38.78 kg/m   Wt Readings from Last 3 Encounters:  12/01/16 212 lb (96.2 kg)  11/20/16 216 lb (98 kg)  10/03/16 212 lb (96.2 kg)    Physical Exam  Constitutional: She appears well-developed and well-nourished. No distress.  HENT:  Head: Normocephalic and atraumatic.  Right Ear: External ear normal.  Left Ear: External ear normal.  Nose: Nose normal.  Mouth/Throat: Oropharynx is clear and moist. No oropharyngeal exudate.  Eyes: Pupils are equal, round, and reactive to light. Conjunctivae and EOM are normal. No scleral icterus.  Neck: Normal range of motion. Neck supple.  Cardiovascular: Normal rate, regular rhythm, normal heart  sounds and intact distal pulses.   No murmur heard. Pulmonary/Chest: Effort normal and breath sounds normal. No respiratory distress. She has no wheezes. She has no rales.  Musculoskeletal: She exhibits no edema.  See HPI for foot exam if done  Lymphadenopathy:    She has no cervical adenopathy.  Skin: Skin is warm and dry. No rash noted.  Psychiatric: She has a normal mood and affect.  Nursing note and vitals reviewed.  Results for orders placed or performed in visit on 86/75/19  Basic metabolic panel  Result Value Ref Range   Sodium 140 135 - 145 mEq/L   Potassium 3.6 3.5 - 5.1 mEq/L   Chloride 102 96 - 112 mEq/L   CO2 32 19 - 32 mEq/L   Glucose, Bld 111 (H) 70 - 99 mg/dL   BUN 28 (H) 6 - 23 mg/dL   Creatinine, Ser 1.00 0.40 - 1.20 mg/dL   Calcium 9.7 8.4 - 10.5 mg/dL   GFR 69.49 >60.00 mL/min      Assessment & Plan:   Problem List Items Addressed This Visit    Diabetes type 2, uncontrolled ([place]) - Primary    Chronic. Continue actos 71m, amaryl 423m Pt interested in combo pill - printed out for her to price out. Pt declines scheduling eye exam. Foot exam today.       Relevant Medications   pioglitazone-glimepiride (DUETACT) 30-4 MG tablet   Other Relevant Orders   Hemoglobin A1W2S Basic metabolic panel   HLD (hyperlipidemia)    Chronic, stable. Continue current regimen.       Hypertension, uncontrolled    Chronic, improved. Continue current regimen.       Stroke (cerebrum) (Kristen [name]   Reviewed tighter control recommendations by neurology      Relevant Orders   TSH   Vitamin D deficiency    Pt states weekly Rx causing fatigue. Discussed continuing weekly vs trial daily 2000 iu. She will consider options.          Follow up plan: Return in about 3 months (around 03/03/2017) for follow up visit.  Kristen [name]

## 2016-12-01 NOTE — Assessment & Plan Note (Signed)
Chronic. Continue actos 30mg , amaryl 4mg . Pt interested in combo pill - printed out for her to price out. Pt declines scheduling eye exam. Foot exam today.

## 2016-12-01 NOTE — Assessment & Plan Note (Signed)
Chronic, improved. Continue current regimen.

## 2016-12-01 NOTE — Patient Instructions (Addendum)
Take either vitamin D 50,000 units weekly or 2000 units daily.  Labs today.  Continue current medicines. Goal A1c <6.5%.  Good to see you today! Price out combo pill printed today (combination of your 2 current diabetes medicines).

## 2016-12-01 NOTE — Assessment & Plan Note (Signed)
Chronic, stable. Continue current regimen. 

## 2016-12-01 NOTE — Assessment & Plan Note (Signed)
Pt states weekly Rx causing fatigue. Discussed continuing weekly vs trial daily 2000 iu. She will consider options.

## 2016-12-04 ENCOUNTER — Encounter: Payer: Self-pay | Admitting: *Deleted

## 2016-12-12 ENCOUNTER — Telehealth: Payer: Self-pay | Admitting: Family Medicine

## 2016-12-12 NOTE — Telephone Encounter (Signed)
Dr. Darnell Level  Please advise-  While talking to the pharmacist at [facility] about another pt, she wanted to make you aware that there is a drug to drug interaction between this pt's pioglitazone-glimepiride and plavix. She states the interaction is that the plavix can decrease the sugar level.  She states they are aware that the pt has taken pioglitazone alone but new rx is a combination medication and they want to know if it is ok to fill.

## 2016-12-13 ENCOUNTER — Telehealth: Payer: Self-pay | Admitting: Cardiovascular Disease

## 2016-12-13 NOTE — Telephone Encounter (Signed)
3 attempts to schedule recall 6 month fu per ckout 12/22/15  Deleting recall

## 2016-12-14 MED ORDER — PIOGLITAZONE HCL-GLIMEPIRIDE 30-2 MG PO TABS: 1.0000 | ORAL_TABLET | Freq: Every day | ORAL | 1 refills | Status: DC

## 2016-12-14 NOTE — Telephone Encounter (Signed)
Will send in lower glimepiride dose.  Would encourage pt to check sugars more frequently with med change for close monitoring for any low sugars.

## 2016-12-14 NOTE — Telephone Encounter (Signed)
Spoke with pt and made her aware of Dr. Synthia Innocent message. Pt understood and had no additional questions at this time. Nothing further is needed

## 2017-01-28 ENCOUNTER — Other Ambulatory Visit: Payer: Self-pay | Admitting: Family Medicine

## 2017-03-12 ENCOUNTER — Encounter: Payer: Self-pay | Admitting: Family Medicine

## 2017-03-12 ENCOUNTER — Ambulatory Visit (INDEPENDENT_AMBULATORY_CARE_PROVIDER_SITE_OTHER): Payer: Medicare Other | Admitting: Family Medicine

## 2017-03-12 VITALS — BP 134/80 | HR 68 | Temp 98.0°F | Wt 213.0 lb

## 2017-03-12 DIAGNOSIS — E1165 Type 2 diabetes mellitus with hyperglycemia: Secondary | ICD-10-CM

## 2017-03-12 DIAGNOSIS — I10 Essential (primary) hypertension: Secondary | ICD-10-CM | POA: Diagnosis not present

## 2017-03-12 DIAGNOSIS — R946 Abnormal results of thyroid function studies: Secondary | ICD-10-CM

## 2017-03-12 DIAGNOSIS — E039 Hypothyroidism, unspecified: Secondary | ICD-10-CM | POA: Insufficient documentation

## 2017-03-12 LAB — TSH: TSH: 3.9 u[IU]/mL (ref 0.35–4.50)

## 2017-03-12 LAB — BASIC METABOLIC PANEL
BUN: 20 mg/dL (ref 6–23)
CO2: 30 meq/L (ref 19–32)
Calcium: 9.5 mg/dL (ref 8.4–10.5)
Chloride: 102 mEq/L (ref 96–112)
Creatinine, Ser: 0.89 mg/dL (ref 0.40–1.20)
GFR: 79.39 mL/min (ref >60.00)
GLUCOSE: 173 mg/dL — AB (ref 70–99)
POTASSIUM: 3.6 meq/L (ref 3.5–5.1)
Sodium: 140 mEq/L (ref 135–145)

## 2017-03-12 LAB — T4, FREE: Free T4: 0.94 ng/dL (ref 0.60–1.60)

## 2017-03-12 LAB — HEMOGLOBIN A1C: Hgb A1c MFr Bld: 6.6 % — ABNORMAL HIGH (ref 4.6–6.5)

## 2017-03-12 NOTE — Progress Notes (Signed)
BP 134/80 (BP Location: Left Arm, Patient Position: Sitting, Cuff Size: Large)   Pulse 68   Temp 98 F (36.7 C) (Oral)   Wt 213 lb (96.6 kg)   SpO2 97%   BMI 38.96 kg/m    CC: 3 mo f/u visit Subjective:    Patient ID: Kristen [name], female    DOB: 1941-03-28, 76 y.o.   MRN: [id number]  HPI: Kristen [name] is a 76 y.o. female presenting on 03/12/2017 for 3 mo follow-up   Stressed with family friend and pt's father both sick in and out of hospital. Father is in [facility] hospital.   H/o acute bilateral lacunar CVA due to small vessel disease 08/2016 - on plavix for secondary stroke prevention. Goal BP <130/90, A1c <6.5%, LDL <70.  DM - lost duetact pill bottle - so taking spilt components (amaryl 2m and actos 335mdaily). Planning to fill duetact next refill. Doesn't check sugars.  Lab Results  Component Value Date   HGBA1C 6.9 (H) 12/01/2016     Denies hypothyroid symptoms.   Relevant past medical, surgical, family and social history reviewed and updated as indicated. Interim medical history since our last visit reviewed. Allergies and medications reviewed and updated. Outpatient Medications Prior to Visit  Medication Sig Dispense Refill  . atorvastatin (LIPITOR) 20 MG tablet Take 1 tablet (20 mg total) by mouth daily at 6 PM. 90 tablet 3  . Blood Glucose Monitoring Suppl (ONE TOUCH ULTRA SYSTEM KIT) w/Device KIT 1 kit by Does not apply route once. 1 each 0  . Cholecalciferol (VITAMIN D) 2000 units CAPS Take 1 capsule by mouth daily.    . clopidogrel (PLAVIX) 75 MG tablet Take 1 tablet (75 mg total) by mouth daily. 90 tablet 3  . clotrimazole (LOTRIMIN AF) 1 % cream Apply 1 application topically 2 (two) times daily. Rash under breast 45 g 1  . Colchicine (MITIGARE) 0.6 MG CAPS Take 1 tablet by mouth daily as needed. 30 capsule 3  . glimepiride (AMARYL) 4 MG tablet Take 1 tablet (4 mg total) by mouth daily with breakfast. 90 tablet 3  . glucose blood test strip Ck blood  sugar twice a day and as directed. 100 each 3  . losartan-hydrochlorothiazide (HYZAAR) 100-12.5 MG tablet Take 1 tablet by mouth daily. 90 tablet 3  . nitroGLYCERIN (NITROSTAT) 0.4 MG SL tablet Place 0.4 mg under the tongue every 5 (five) minutes as needed for chest pain.    . Glory RosebushELICA LANCETS [id number] Check blood sugar twice a day and as directed. 100 each 3  . pioglitazone (ACTOS) 30 MG tablet Take 1 tablet (30 mg total) by mouth daily. 90 tablet 3  . pioglitazone-glimepiride (DUETACT) 30-2 MG tablet Take 1 tablet by mouth daily with breakfast. 90 tablet 1  . potassium chloride (K-DUR) 10 MEQ tablet Take 1 tablet (10 mEq total) by mouth daily. (Patient taking differently: Take 10 mEq by mouth every Monday, Wednesday, and Friday. ) 90 tablet 3  . simvastatin (ZOCOR) 20 MG tablet      No facility-administered medications prior to visit.      Per HPI unless specifically indicated in ROS section below Review of Systems     Objective:    BP 134/80 (BP Location: Left Arm, Patient Position: Sitting, Cuff Size: Large)   Pulse 68   Temp 98 F (36.7 C) (Oral)   Wt 213 lb (96.6 kg)   SpO2 97%   BMI 38.96 kg/m   Wt Readings  from Last 3 Encounters:  03/12/17 213 lb (96.6 kg)  12/01/16 212 lb (96.2 kg)  11/20/16 216 lb (98 kg)    Physical Exam  Constitutional: She appears well-developed and well-nourished. No distress.  HENT:  Head: Normocephalic and atraumatic.  Right Ear: External ear normal.  Left Ear: External ear normal.  Nose: Nose normal.  Mouth/Throat: Oropharynx is clear and moist. No oropharyngeal exudate.  Eyes: Conjunctivae and EOM are normal. Pupils are equal, round, and reactive to light. No scleral icterus.  Neck: Normal range of motion. Neck supple.  Cardiovascular: Normal rate, regular rhythm, normal heart sounds and intact distal pulses.  No murmur heard. Pulmonary/Chest: Effort normal and breath sounds normal. No respiratory distress. She has no wheezes. She  has no rales.  Musculoskeletal: She exhibits no edema.  See HPI for foot exam if done  Lymphadenopathy:    She has no cervical adenopathy.  Skin: Skin is warm and dry. No rash noted.  Psychiatric: She has a normal mood and affect.  Nursing note and vitals reviewed.  Results for orders placed or performed in visit on 12/01/16  TSH  Result Value Ref Range   TSH 4.77 (H) 0.35 - 4.50 uIU/mL  Hemoglobin A1c  Result Value Ref Range   Hgb A1c MFr Bld 6.9 (H) 4.6 - 6.5 %  Basic metabolic panel  Result Value Ref Range   Sodium 139 135 - 145 mEq/L   Potassium 4.1 3.5 - 5.1 mEq/L   Chloride 103 96 - 112 mEq/L   CO2 33 (H) 19 - 32 mEq/L   Glucose, Bld 85 70 - 99 mg/dL   BUN 19 6 - 23 mg/dL   Creatinine, Ser 0.99 0.40 - 1.20 mg/dL   Calcium 9.6 8.4 - 10.5 mg/dL   GFR 70.26 >60.00 mL/min      Assessment & Plan:   Problem List Items Addressed This Visit    Diabetes type 2, uncontrolled ([place]) - Primary    Chronic, continue actos and amaryl. She has not been taking combo pill but rather individual components. She desires to try combo pill next fill.       Relevant Orders   Basic metabolic panel   Hemoglobin A1c   Hypertension, uncontrolled    Chronic, mildly elevated today. Continue to monitor.       Thyroid function test abnormal    Denies hypothyroid sxs. Update labs today.       Relevant Orders   TSH   T4, free       Follow up plan: Return in about 6 months (around 09/09/2017) for annual exam, prior fasting for blood work, medicare wellness visit.  [name], MD

## 2017-03-12 NOTE — Patient Instructions (Addendum)
You are doing well today.  Labs to check today.  Return as needed or in 6 months for medicare wellness and physical, sooner if needed.

## 2017-03-12 NOTE — Assessment & Plan Note (Signed)
Chronic, mildly elevated today. Continue to monitor.

## 2017-03-12 NOTE — Assessment & Plan Note (Signed)
Denies hypothyroid sxs. Update labs today.

## 2017-03-12 NOTE — Assessment & Plan Note (Signed)
Chronic, continue actos and amaryl. She has not been taking combo pill but rather individual components. She desires to try combo pill next fill.

## 2017-05-06 IMAGING — MR MR WRIST*L* WO/W CM
5 of 10 series · 19 of 40 positions shown · IV contrast (multihance)
Comparison: None.

CLINICAL DATA: Pain, swelling and tenderness involving the wrist.
Fell 3 weeks ago.

EXAM:
MR OF THE LEFT WRIST WITHOUT AND WITH CONTRAST
TECHNIQUE: Multiplanar multisequence MR imaging of the left wrist was performed
both before and after the administration of intravenous contrast.
CONTRAST:  19mL MULTIHANCE GADOBENATE DIMEGLUMINE 529 MG/ML IV SOLN

[Series 3: T1 · axial · 3.0mm · 0.21mm/px · z∈[-26,+51]mm · 4 of 32 slices shown]
[im 1/32]
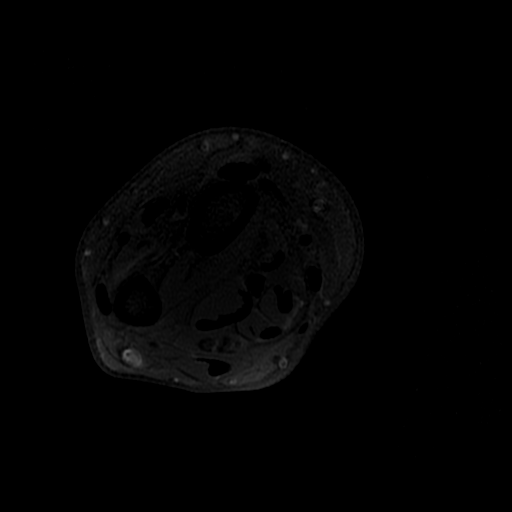
[im 8/32]
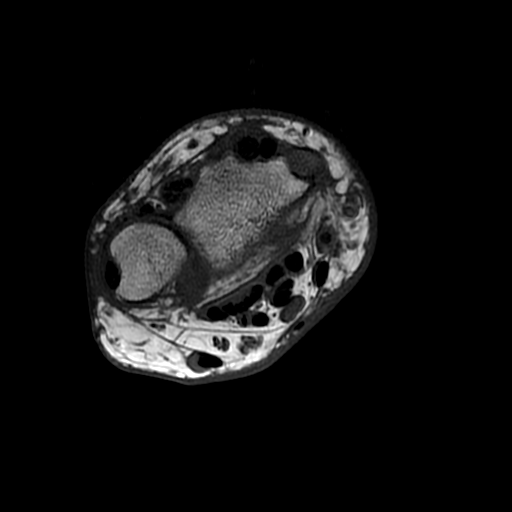
[im 16/32]
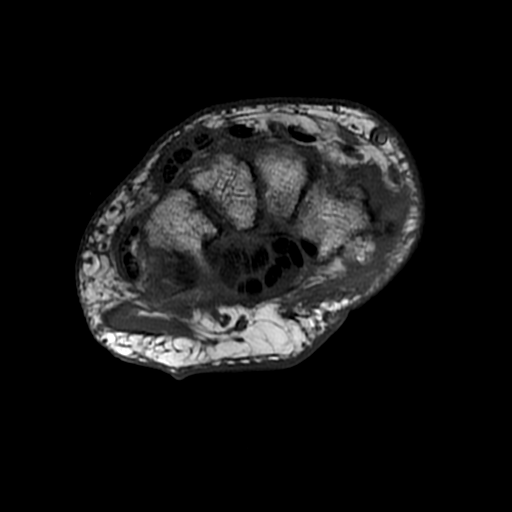
[im 24/32]
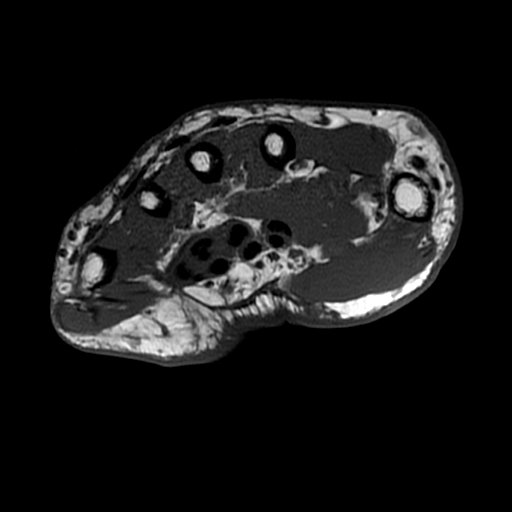

[Series 5: T2 fat-sat · axial · 3.0mm · 0.21mm/px · z∈[-26,+78]mm · 4 of 32 slices shown (1 of 2)]
[im 1/32]
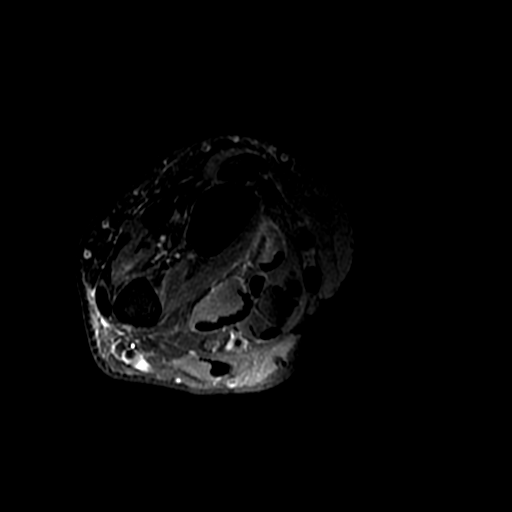
[im 11/32]
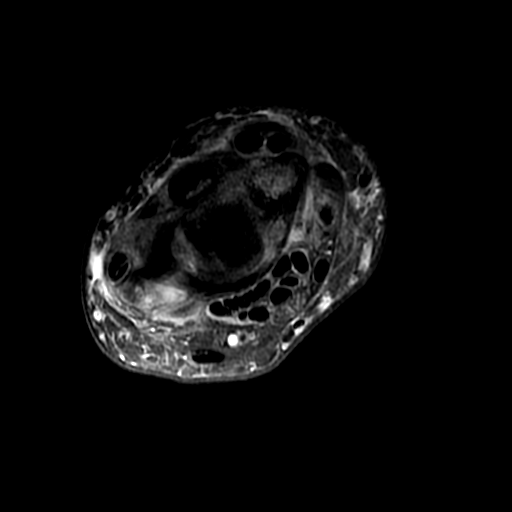
[im 21/32]
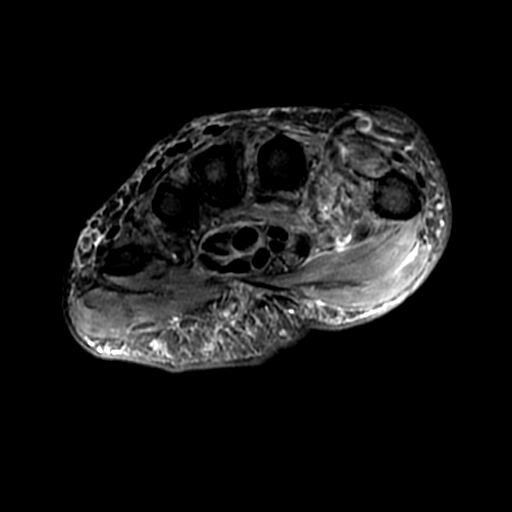
[im 32/32]
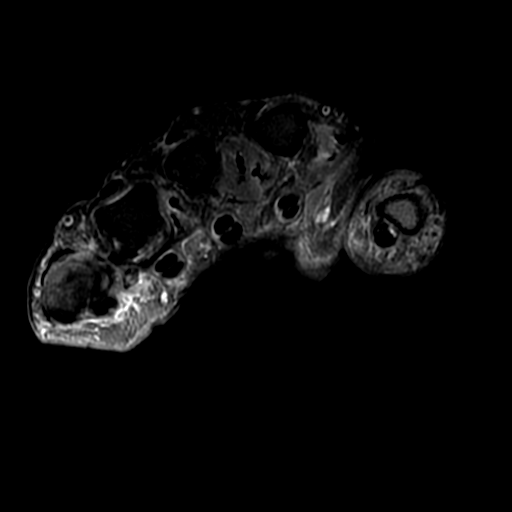

[Series 6: T2 fat-sat · coronal · 3.0mm · 0.23mm/px · 3 of 22 slices shown (2 of 2)]
[im 1/22]
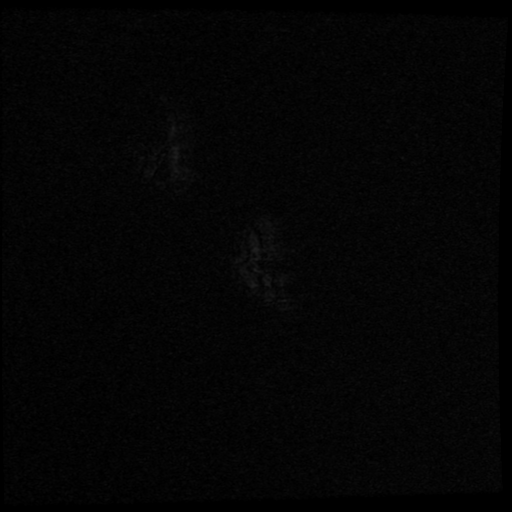
[im 11/22]
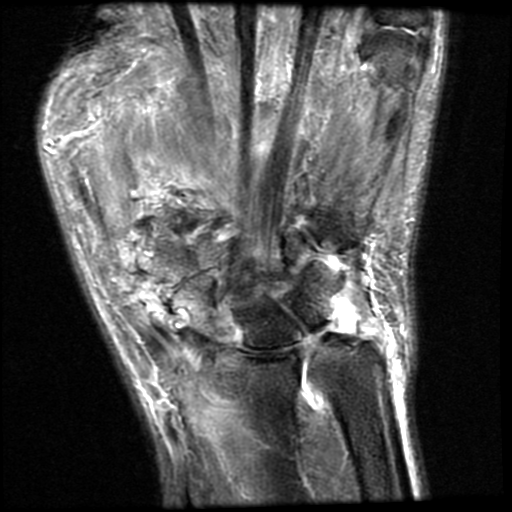
[im 22/22]
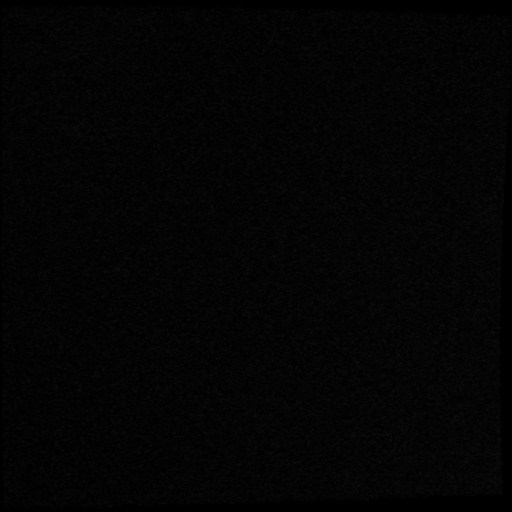

[Series 8: PD fat-sat · coronal · 3.0mm · 0.23mm/px · 3 of 22 slices shown (1 of 2)]
[im 1/22]
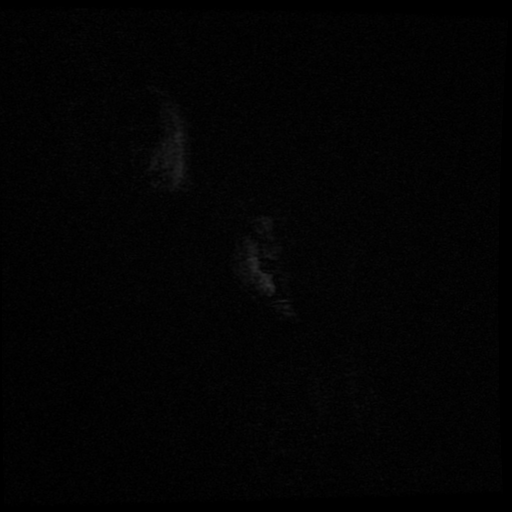
[im 11/22]
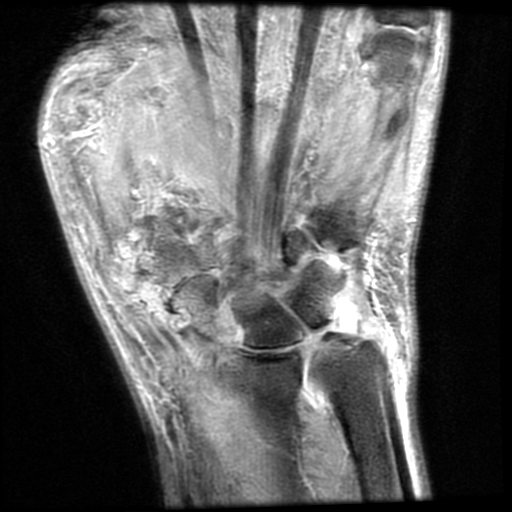
[im 22/22]
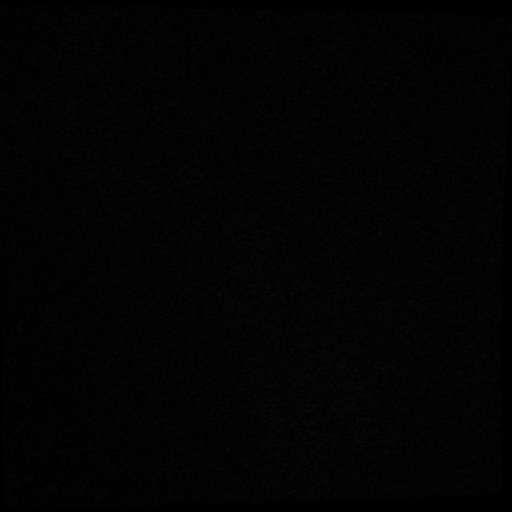

[Series 9: PD fat-sat · sagittal · 2.5mm · 0.23mm/px · 5 of 36 slices shown (2 of 2)]
[im 1/36]
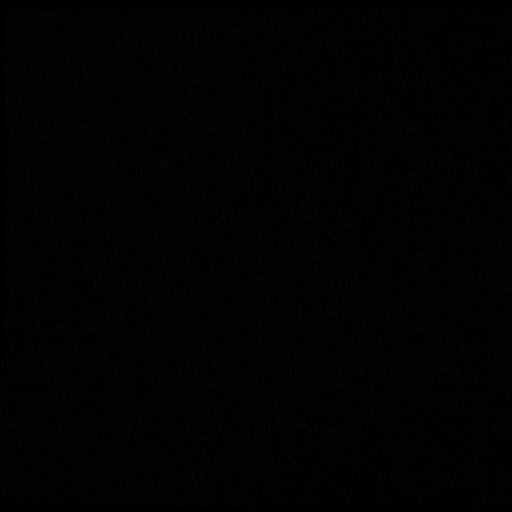
[im 9/36]
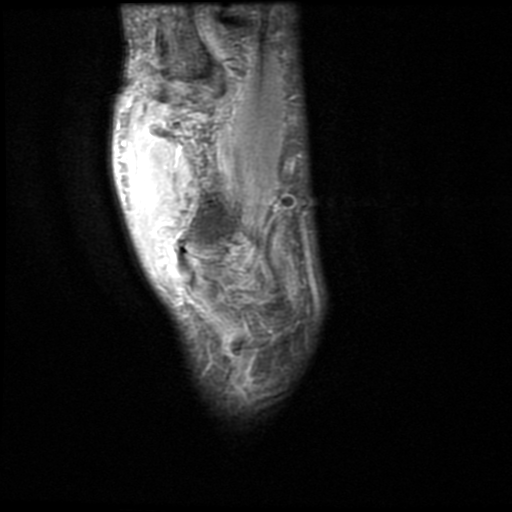
[im 18/36]
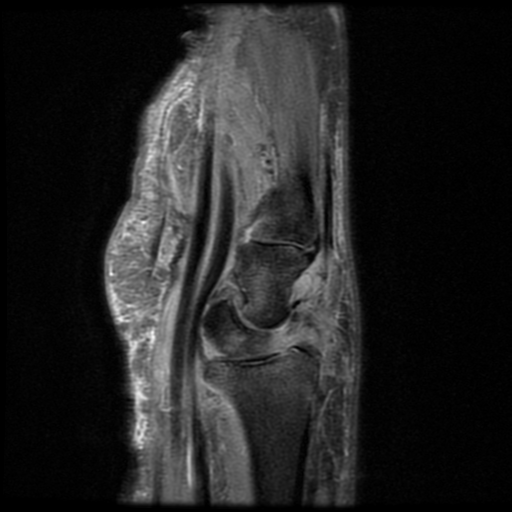
[im 27/36]
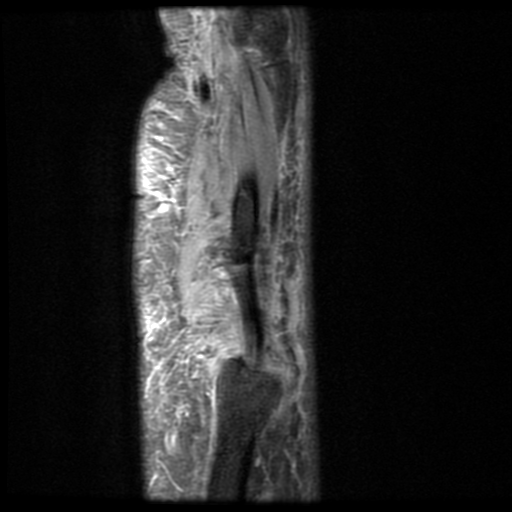
[im 36/36]
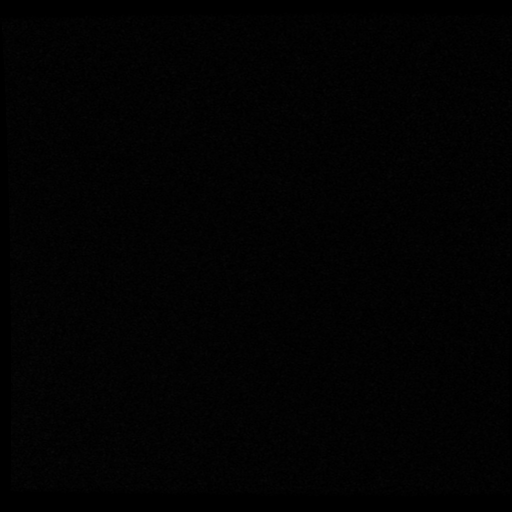

[19 of 40 positions shown; findings below may reference images not displayed]

FINDINGS: Advanced changes of an inflammatory arthropathy involving the wrist
with carpal crowding, severe synovitis and erosions. Patient has a
history of gout.

Ligaments: Markedly widened scapholunate joint space suggesting
scapholunate ligament tear with early settling of the capitate. The
lunotriquetral ligament appears intact.

Triangular fibrocartilage: Large central tear with fluid in the
radioulnar joint.

Tendons: Severe chronic tenosynovitis and tendinopathy involving the
APL and EPB tendons (deQuervain's tenosynovitis). Moderate enhancing
synovitis involving all of the wrist tendons.

Carpal tunnel/median nerve: Enhancing synovitis surrounding the
carpal tunnel tendons. The median nerve is prominent but not
obviously inflamed.

Guyon's canal: Normal

Joint/cartilage: Severe/advanced synovitis with articular cartilage
loss, joint space narrowing, reactive bone edema and erosions.
Moderate-sized joint effusion.

Bones/carpal alignment: Advanced degenerative changes at the
carpometacarpal joint of the thumb. Scapholunate insufficiency with
settling of the capitate.

Other: No obvious acute fractures identified.
IMPRESSION: 1. Advanced changes of an inflammatory/erosive arthropathy involving
the wrist with specific findings as discussed above. This is likely
due to gout given the patient's history of gout.
2. DeQuervain's tenosynovitis.
3. No obvious acute fracture.

## 2017-05-07 ENCOUNTER — Telehealth: Payer: Self-pay | Admitting: *Deleted

## 2017-05-07 ENCOUNTER — Ambulatory Visit: Payer: Medicare Other | Admitting: Nurse Practitioner

## 2017-05-07 NOTE — Telephone Encounter (Signed)
Patient was no show for FU with NP today. 

## 2017-05-07 NOTE — Progress Notes (Deleted)
[facility]  PATIENT: Vendetta Pittinger Bottcher DOB: June 10, 1940   REASON FOR VISIT: Follow-up for lacunar infarct HISTORY FROM:    HISTORY OF PRESENT ILLNESS: 11/20/16 PSMs Kristen [name] is a 15 year pleasant Caucasian lady seen today for first office follow-up visit following hospital admission for stroke in May 2018. History is obtained from the patient and review of electronic medical records as well as I have personally reviewed imaging films and hospital workup. She presented on 09/02/16 with sudden onset of right arm and leg weakness and numbness but presented close to the time window for TPA at 4-1/2 hours but after discussion of risk-benefit she declined IV tPA due to hemorrhage risk. MRI scan of the brain showed a 1.5 cm infarct in the left corona radiata extending up to the perirolandic cortex as well as a smaller punctate right white matter infarct as well. MRA of the brain showed moderate to advanced proximal right PCA stenosis. Carotid ultrasound showed no significant extracranial stenosis. Transthoracic echo showed normal ejection fraction but mild LVH but no Source of embolism. Hemoglobin A1c was elevated at 9.3 and LDL cholesterol was 75 mg percent. Patient was on Zocor 20 mg daily the dose of which was increased to 40 mg daily. She was started on Plavix for stroke prevention. She states his done well since discharge but for some reason Zocor was changed to Lipitor 20 mg daily which is currently taking. She is tolerating well without muscle aches and pains. She is tolerating Plavix without bruising or bleeding. She states her blood pressure is well controlled though today it is slightly elevated at 149/77. She states her sugars are doing better though she cannot tell me her fasting sugars the last hemoglobin A1c. She states she is trying to eat healthy weight does fluctuate she does lose a few pounds but then gained it right back. She is able to walk independently and has finished  home physical and occupation therapy. She does use a cane for outdoors. She had one fall when she first went home but has done well in the last few months. She has no other new complaints today.  REVIEW OF SYSTEMS: Full 14 system review of systems performed and notable only for those listed, all others are neg:  Constitutional: neg  Cardiovascular: neg Ear/Nose/Throat: neg  Skin: neg Eyes: neg Respiratory: neg Gastroitestinal: neg  Hematology/Lymphatic: neg  Endocrine: neg Musculoskeletal:neg Allergy/Immunology: neg Neurological: neg Psychiatric: neg Sleep : neg   ALLERGIES: Allergies  Allergen Reactions  . Acetaminophen Other (See Comments)    "Causes me to spit up blood"  . Aleve [Naproxen Sodium] Other (See Comments)    Spits up blood  . Doxycycline Other (See Comments)    Malaise, GI upset, "felt drunk" and very ill  . Metformin And Related Other (See Comments)    Chills, dizziness  . Penicillins Rash    Has patient had a PCN reaction causing immediate rash, facial/tongue/throat swelling, SOB or lightheadedness with hypotension: Yes Has patient had a PCN reaction causing severe rash involving mucus membranes or skin necrosis: No Has patient had a PCN reaction that required hospitalization No Has patient had a PCN reaction occurring within the last 10 years: No If all of the above answers are "NO", then may proceed with Cephalosporin use.     HOME MEDICATIONS: Outpatient Medications Prior to Visit  Medication Sig Dispense Refill  . atorvastatin (LIPITOR) 20 MG tablet Take 1 tablet (20 mg total) by mouth daily at 6 PM. 90  tablet 3  . Blood Glucose Monitoring Suppl (ONE TOUCH ULTRA SYSTEM KIT) w/Device KIT 1 kit by Does not apply route once. 1 each 0  . Cholecalciferol (VITAMIN D) 2000 units CAPS Take 1 capsule by mouth daily.    . clopidogrel (PLAVIX) 75 MG tablet Take 1 tablet (75 mg total) by mouth daily. 90 tablet 3  . clotrimazole (LOTRIMIN AF) 1 % cream Apply 1  application topically 2 (two) times daily. Rash under breast 45 g 1  . Colchicine (MITIGARE) 0.6 MG CAPS Take 1 tablet by mouth daily as needed. 30 capsule 3  . glimepiride (AMARYL) 4 MG tablet Take 1 tablet (4 mg total) by mouth daily with breakfast. 90 tablet 3  . glucose blood test strip Ck blood sugar twice a day and as directed. 100 each 3  . losartan-hydrochlorothiazide (HYZAAR) 100-12.5 MG tablet Take 1 tablet by mouth daily. 90 tablet 3  . nitroGLYCERIN (NITROSTAT) 0.4 MG SL tablet Place 0.4 mg under the tongue every 5 (five) minutes as needed for chest pain.    [name] DELICA LANCETS 59R MISC Check blood sugar twice a day and as directed. 100 each 3  . pioglitazone (ACTOS) 30 MG tablet Take 1 tablet (30 mg total) by mouth daily. 90 tablet 3  . pioglitazone-glimepiride (DUETACT) 30-2 MG tablet Take 1 tablet by mouth daily with breakfast. 90 tablet 1  . potassium chloride (K-DUR) 10 MEQ tablet Take 1 tablet (10 mEq total) by mouth daily. (Patient taking differently: Take 10 mEq by mouth every Monday, Wednesday, and Friday. ) 90 tablet 3  . simvastatin (ZOCOR) 20 MG tablet      No facility-administered medications prior to visit.     PAST MEDICAL HISTORY: Past Medical History:  Diagnosis Date  . Abdominal aortic atherosclerosis ([place]) by CT 02/2014  . CAD (coronary artery disease)    by CT, per pt h/o MI  . Diabetes type 2, uncontrolled ([place])   . Frequent headaches   . GERD (gastroesophageal reflux disease)   . Gout   . History of pulmonary embolism 2012  . HLD (hyperlipidemia)   . HTN (hypertension)   . 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 Morbid obesity ([place])   . Myocardial infarction [facility]) 2012   per pt. report, states she was treated with medicine, here at [facility]    . Osteoarthritis    knees  . Primary localized osteoarthritis of left knee 09/22/2014  . Sleep apnea 2011   study done  in [place], states that since she lost weight she doesn't use the CPAP any longer & she doesn't have a problem with sleep apnea  . Stroke [facility])    still has balance problem on occas. , uses cane but that's mainly for the left knee pain  . Syncope 03/01/2016  . Thoracic aortic atherosclerosis ([place]) 12/2015   by CXR  . Vertigo    hx. benign postitional postural    PAST SURGICAL HISTORY: Past Surgical History:  Procedure Laterality Date  . CATARACT EXTRACTION Bilateral 2013  . EYE SURGERY     /w IOL  . FOOT SURGERY Right   . PARTIAL HYSTERECTOMY     for fibroids, ovaries remain  . TONSILLECTOMY    . TOTAL KNEE ARTHROPLASTY Right 1990s  . TOTAL KNEE ARTHROPLASTY Left 09/22/2014   [name], MD  . TUBAL LIGATION      FAMILY HISTORY:  Family History  Problem Relation Age of Onset  . Cancer Mother        bone  . Diabetes Father   . Hypertension Father   . Cancer Son 31       lung  . Congenital heart disease Son 78  . Stroke Brother     SOCIAL HISTORY: Social History   Socioeconomic History  . Marital status: Widowed    Spouse name: Not on file  . Number of children: Not on file  . Years of education: Not on file  . Highest education level: Not on file  Social Needs  . Financial resource strain: Not on file  . Food insecurity - worry: Not on file  . Food insecurity - inability: Not on file  . Transportation needs - medical: Not on file  . Transportation needs - non-medical: Not on file  Occupational History  . Not on file  Tobacco Use  . Smoking status: Never Smoker  . Smokeless tobacco: Never Used  . Tobacco comment: tobacco use- no   Substance and Sexual Activity  . Alcohol use: Yes    Alcohol/week: 1.2 oz    Types: 1 Glasses of wine, 1 Cans of beer per week    Comment: Rare  . Drug use: No  . Sexual activity: Not on file  Other Topics Concern  . Not on file  Social History Narrative   Lives alone   4 grown children   Education: 11th grade    Occupation: retired, was Scientist, water quality   Activity: Does not regularly exercise.    Diet: good water, fruits/vegetables seldom     PHYSICAL EXAM  There were no vitals filed for this visit. There is no height or weight on file to calculate BMI.  Generalized: Well developed, in no acute distress  Head: normocephalic and atraumatic,. Oropharynx benign  Neck: Supple, no carotid bruits  Cardiac: Regular rate rhythm, no murmur  Musculoskeletal: No deformity   Neurological examination   Mentation: Alert oriented to time, place, history taking. Attention span and concentration appropriate. Recent and remote memory intact.  Follows all commands speech and language fluent.   Cranial nerve II-XII: Fundoscopic exam reveals sharp disc margins.Pupils were equal round reactive to light extraocular movements were full, visual field were full on confrontational test. Facial sensation and strength were normal. hearing was intact to finger rubbing bilaterally. Uvula tongue midline. head turning and shoulder shrug were normal and symmetric.Tongue protrusion into cheek strength was normal. Motor: normal bulk and tone, full strength in the BUE, BLE, fine finger movements normal, no pronator drift. No focal weakness Sensory: normal and symmetric to light touch, pinprick, and  Vibration, proprioception  Coordination: finger-nose-finger, heel-to-shin bilaterally, no dysmetria Reflexes: Brachioradialis 2/2, biceps 2/2, triceps 2/2, patellar 2/2, Achilles 2/2, plantar responses were flexor bilaterally. Gait and Station: Rising up from seated position without assistance, normal stance,  moderate stride, good arm swing, smooth turning, able to perform tiptoe, and heel walking without difficulty. Tandem gait is steady  DIAGNOSTIC DATA (LABS, IMAGING, TESTING) - I reviewed patient records, labs, notes, testing and imaging myself where available.  Lab Results  Component Value Date   WBC 6.3 09/05/2016   HGB  12.2 09/05/2016   HCT 38.4 09/05/2016   MCV 85.5 09/05/2016   PLT 234 09/05/2016      Component Value Date/Time   NA 140 03/12/2017 0942   NA 136 03/10/2014 1233   K 3.6 03/12/2017 0942   K 4.1 03/10/2014 1233  CL 102 03/12/2017 0942   CL 103 03/10/2014 1233   CO2 30 03/12/2017 0942   CO2 28 03/10/2014 1233   GLUCOSE 173 (H) 03/12/2017 0942   GLUCOSE 141 (H) 03/10/2014 1233   BUN 20 03/12/2017 0942   BUN 20 (H) 03/10/2014 1233   CREATININE 0.89 03/12/2017 0942   CREATININE 1.34 (H) 03/10/2014 1233   CALCIUM 9.5 03/12/2017 0942   CALCIUM 9.5 03/10/2014 1233   PROT 6.8 09/05/2016 0825   PROT 8.5 (H) 03/10/2014 1233   ALBUMIN 3.2 (L) 09/05/2016 0825   ALBUMIN 3.6 03/10/2014 1233   AST 15 09/05/2016 0825   AST 17 03/10/2014 1233   ALT 8 (L) 09/05/2016 0825   ALT 23 03/10/2014 1233   ALKPHOS 82 09/05/2016 0825   ALKPHOS 122 (H) 03/10/2014 1233   BILITOT 0.7 09/05/2016 0825   BILITOT 0.8 03/10/2014 1233   GFRNONAA 59 (L) 09/05/2016 0825   GFRNONAA 41 (L) 03/10/2014 1233   GFRNONAA 59 (L) 07/02/2013 0032   GFRAA >60 09/05/2016 0825   GFRAA 50 (L) 03/10/2014 1233   GFRAA >60 07/02/2013 0032   Lab Results  Component Value Date   CHOL 136 09/03/2016   HDL 49 09/03/2016   LDLCALC 75 09/03/2016   LDLDIRECT 102.0 12/31/2015   TRIG 62 09/03/2016   CHOLHDL 2.8 09/03/2016   Lab Results  Component Value Date   HGBA1C 6.6 (H) 03/12/2017   Lab Results  Component Value Date   VITAMINB12 520 12/13/2010   Lab Results  Component Value Date   TSH 3.90 03/12/2017    ***  ASSESSMENT AND PLAN 67 year Caucasian lady with bilateral subcortical lacunar infarcts in May 2018 secondary to small vessel disease with vascular risk factors of hypertension, hyperlipidemia, diabetes and needs. She seems to be doing quite well.    PLAN: I had a long d/w patient about her recent lacunar stroke, risk for recurrent stroke/TIAs, personally independently reviewed imaging studies and  stroke evaluation results and answered questions.Continue  Plavix  for secondary stroke prevention and maintain strict control of hypertension with blood pressure goal below 130/90, diabetes with hemoglobin A1c goal below 6.5% and lipids with LDL cholesterol goal below 70 mg/dL. I also advised the patient to eat a healthy diet with plenty of whole grains, cereals, fruits and vegetables, exercise regularly and maintain   Dennie Bible, [facility], [facility], APRN  [facility] [address] [place], [place] [address] [phone]

## 2017-05-08 ENCOUNTER — Encounter: Payer: Self-pay | Admitting: Nurse Practitioner

## 2017-05-09 ENCOUNTER — Encounter: Payer: Self-pay | Admitting: Nurse Practitioner

## 2017-08-10 ENCOUNTER — Ambulatory Visit: Payer: Medicare Other

## 2017-08-12 ENCOUNTER — Inpatient Hospital Stay
Admission: EM | Admit: 2017-08-12 | Discharge: 2017-08-18 | DRG: 101 | Disposition: A | Discharge status: Skilled Nursing Facility | Payer: Medicare Other | Attending: Family Medicine | Admitting: Family Medicine

## 2017-08-12 ENCOUNTER — Encounter: Payer: Self-pay | Admitting: Emergency Medicine

## 2017-08-12 ENCOUNTER — Inpatient Hospital Stay: Payer: Medicare Other

## 2017-08-12 ENCOUNTER — Emergency Department: Payer: Medicare Other

## 2017-08-12 ENCOUNTER — Other Ambulatory Visit: Payer: Self-pay

## 2017-08-12 DIAGNOSIS — Z9841 Cataract extraction status, right eye: Secondary | ICD-10-CM | POA: Diagnosis not present

## 2017-08-12 DIAGNOSIS — E119 Type 2 diabetes mellitus without complications: Secondary | ICD-10-CM | POA: Diagnosis not present

## 2017-08-12 DIAGNOSIS — R262 Difficulty in walking, not elsewhere classified: Secondary | ICD-10-CM | POA: Diagnosis not present

## 2017-08-12 DIAGNOSIS — R739 Hyperglycemia, unspecified: Secondary | ICD-10-CM

## 2017-08-12 DIAGNOSIS — Z833 Family history of diabetes mellitus: Secondary | ICD-10-CM

## 2017-08-12 DIAGNOSIS — Z6837 Body mass index (BMI) 37.0-37.9, adult: Secondary | ICD-10-CM | POA: Diagnosis not present

## 2017-08-12 DIAGNOSIS — I2510 Atherosclerotic heart disease of native coronary artery without angina pectoris: Secondary | ICD-10-CM | POA: Diagnosis not present

## 2017-08-12 DIAGNOSIS — Z8249 Family history of ischemic heart disease and other diseases of the circulatory system: Secondary | ICD-10-CM

## 2017-08-12 DIAGNOSIS — M109 Gout, unspecified: Secondary | ICD-10-CM | POA: Diagnosis not present

## 2017-08-12 DIAGNOSIS — R4182 Altered mental status, unspecified: Secondary | ICD-10-CM

## 2017-08-12 DIAGNOSIS — Z96653 Presence of artificial knee joint, bilateral: Secondary | ICD-10-CM | POA: Diagnosis present

## 2017-08-12 DIAGNOSIS — G4089 Other seizures: Principal | ICD-10-CM | POA: Diagnosis present

## 2017-08-12 DIAGNOSIS — E785 Hyperlipidemia, unspecified: Secondary | ICD-10-CM | POA: Diagnosis present

## 2017-08-12 DIAGNOSIS — R2689 Other abnormalities of gait and mobility: Secondary | ICD-10-CM | POA: Diagnosis not present

## 2017-08-12 DIAGNOSIS — Z808 Family history of malignant neoplasm of other organs or systems: Secondary | ICD-10-CM

## 2017-08-12 DIAGNOSIS — Z961 Presence of intraocular lens: Secondary | ICD-10-CM | POA: Diagnosis not present

## 2017-08-12 DIAGNOSIS — K219 Gastro-esophageal reflux disease without esophagitis: Secondary | ICD-10-CM | POA: Diagnosis present

## 2017-08-12 DIAGNOSIS — Z886 Allergy status to analgesic agent status: Secondary | ICD-10-CM

## 2017-08-12 DIAGNOSIS — Z9842 Cataract extraction status, left eye: Secondary | ICD-10-CM | POA: Diagnosis not present

## 2017-08-12 DIAGNOSIS — Z880 Allergy status to penicillin: Secondary | ICD-10-CM

## 2017-08-12 DIAGNOSIS — N179 Acute kidney failure, unspecified: Secondary | ICD-10-CM | POA: Diagnosis present

## 2017-08-12 DIAGNOSIS — Z823 Family history of stroke: Secondary | ICD-10-CM | POA: Diagnosis not present

## 2017-08-12 DIAGNOSIS — I10 Essential (primary) hypertension: Secondary | ICD-10-CM | POA: Diagnosis not present

## 2017-08-12 DIAGNOSIS — I700 Atherosclerosis of aorta: Secondary | ICD-10-CM | POA: Diagnosis not present

## 2017-08-12 DIAGNOSIS — I63233 Cerebral infarction due to unspecified occlusion or stenosis of bilateral carotid arteries: Secondary | ICD-10-CM | POA: Diagnosis not present

## 2017-08-12 DIAGNOSIS — R569 Unspecified convulsions: Secondary | ICD-10-CM | POA: Diagnosis not present

## 2017-08-12 DIAGNOSIS — Z86711 Personal history of pulmonary embolism: Secondary | ICD-10-CM

## 2017-08-12 DIAGNOSIS — I69351 Hemiplegia and hemiparesis following cerebral infarction affecting right dominant side: Secondary | ICD-10-CM

## 2017-08-12 DIAGNOSIS — E1165 Type 2 diabetes mellitus with hyperglycemia: Secondary | ICD-10-CM | POA: Diagnosis present

## 2017-08-12 DIAGNOSIS — Z801 Family history of malignant neoplasm of trachea, bronchus and lung: Secondary | ICD-10-CM | POA: Diagnosis not present

## 2017-08-12 DIAGNOSIS — I252 Old myocardial infarction: Secondary | ICD-10-CM

## 2017-08-12 DIAGNOSIS — I639 Cerebral infarction, unspecified: Secondary | ICD-10-CM | POA: Diagnosis present

## 2017-08-12 DIAGNOSIS — R130 Aphagia: Secondary | ICD-10-CM | POA: Diagnosis present

## 2017-08-12 DIAGNOSIS — E860 Dehydration: Secondary | ICD-10-CM | POA: Diagnosis present

## 2017-08-12 DIAGNOSIS — Z888 Allergy status to other drugs, medicaments and biological substances status: Secondary | ICD-10-CM

## 2017-08-12 DIAGNOSIS — Z66 Do not resuscitate: Secondary | ICD-10-CM | POA: Diagnosis present

## 2017-08-12 DIAGNOSIS — S0990XA Unspecified injury of head, initial encounter: Secondary | ICD-10-CM | POA: Diagnosis not present

## 2017-08-12 DIAGNOSIS — Z881 Allergy status to other antibiotic agents status: Secondary | ICD-10-CM

## 2017-08-12 DIAGNOSIS — Z8673 Personal history of transient ischemic attack (TIA), and cerebral infarction without residual deficits: Secondary | ICD-10-CM | POA: Diagnosis not present

## 2017-08-12 DIAGNOSIS — M6281 Muscle weakness (generalized): Secondary | ICD-10-CM | POA: Diagnosis not present

## 2017-08-12 DIAGNOSIS — S299XXA Unspecified injury of thorax, initial encounter: Secondary | ICD-10-CM | POA: Diagnosis not present

## 2017-08-12 DIAGNOSIS — R4701 Aphasia: Secondary | ICD-10-CM

## 2017-08-12 DIAGNOSIS — E876 Hypokalemia: Secondary | ICD-10-CM | POA: Diagnosis not present

## 2017-08-12 DIAGNOSIS — Z90710 Acquired absence of both cervix and uterus: Secondary | ICD-10-CM

## 2017-08-12 DIAGNOSIS — H905 Unspecified sensorineural hearing loss: Secondary | ICD-10-CM | POA: Diagnosis present

## 2017-08-12 DIAGNOSIS — Z7902 Long term (current) use of antithrombotics/antiplatelets: Secondary | ICD-10-CM

## 2017-08-12 DIAGNOSIS — I6789 Other cerebrovascular disease: Secondary | ICD-10-CM | POA: Diagnosis not present

## 2017-08-12 LAB — COMPREHENSIVE METABOLIC PANEL
ALT: 16 U/L (ref 14–54)
ANION GAP: 8 (ref 5–15)
AST: 31 U/L (ref 15–41)
Albumin: 3.7 g/dL (ref 3.5–5.0)
Alkaline Phosphatase: 85 U/L (ref 38–126)
BUN: 23 mg/dL — ABNORMAL HIGH (ref 6–20)
CHLORIDE: 95 mmol/L — AB (ref 101–111)
CO2: 28 mmol/L (ref 22–32)
CREATININE: 1.35 mg/dL — AB (ref 0.44–1.00)
Calcium: 8.8 mg/dL — ABNORMAL LOW (ref 8.9–10.3)
GFR calc non Af Amer: 37 mL/min — ABNORMAL LOW (ref >60)
GFR, EST AFRICAN AMERICAN: 43 mL/min — AB (ref >60)
Glucose, Bld: 532 mg/dL (ref 65–99)
POTASSIUM: 4.3 mmol/L (ref 3.5–5.1)
SODIUM: 131 mmol/L — AB (ref 135–145)
Total Bilirubin: 1.3 mg/dL — ABNORMAL HIGH (ref 0.3–1.2)
Total Protein: 7.1 g/dL (ref 6.5–8.1)

## 2017-08-12 LAB — URINE DRUG SCREEN, QUALITATIVE (ARMC ONLY)
AMPHETAMINES, UR SCREEN: NOT DETECTED
Barbiturates, Ur Screen: NOT DETECTED
Benzodiazepine, Ur Scrn: NOT DETECTED
COCAINE METABOLITE, UR ~~LOC~~: NOT DETECTED
Cannabinoid 50 Ng, Ur ~~LOC~~: NOT DETECTED
MDMA (ECSTASY) UR SCREEN: NOT DETECTED
METHADONE SCREEN, URINE: NOT DETECTED
Opiate, Ur Screen: NOT DETECTED
Phencyclidine (PCP) Ur S: NOT DETECTED
TRICYCLIC, UR SCREEN: NOT DETECTED

## 2017-08-12 LAB — URINALYSIS, COMPLETE (UACMP) WITH MICROSCOPIC
BILIRUBIN URINE: NEGATIVE
Glucose, UA: >=500 mg/dL — AB
KETONES UR: 5 mg/dL — AB
LEUKOCYTES UA: NEGATIVE
NITRITE: NEGATIVE
PROTEIN: 30 mg/dL — AB
Specific Gravity, Urine: 1.027 (ref 1.005–1.030)
pH: 5 (ref 5.0–8.0)

## 2017-08-12 LAB — DIFFERENTIAL
BASOS PCT: 0 %
Basophils Absolute: 0 10*3/uL (ref 0–0.1)
EOS ABS: 0 10*3/uL (ref 0–0.7)
Eosinophils Relative: 0 %
Lymphocytes Relative: 14 %
Lymphs Abs: 1.6 10*3/uL (ref 1.0–3.6)
MONO ABS: 0.5 10*3/uL (ref 0.2–0.9)
Monocytes Relative: 5 %
NEUTROS ABS: 9.4 10*3/uL — AB (ref 1.4–6.5)
Neutrophils Relative %: 81 %

## 2017-08-12 LAB — CBC
HCT: 38.9 % (ref 35.0–47.0)
Hemoglobin: 12.4 g/dL (ref 12.0–16.0)
MCH: 27.4 pg (ref 26.0–34.0)
MCHC: 32 g/dL (ref 32.0–36.0)
MCV: 85.5 fL (ref 80.0–100.0)
PLATELETS: 247 10*3/uL (ref 150–440)
RBC: 4.55 MIL/uL (ref 3.80–5.20)
RDW: 16 % — AB (ref 11.5–14.5)
WBC: 11.6 10*3/uL — AB (ref 3.6–11.0)

## 2017-08-12 LAB — GLUCOSE, CAPILLARY
Glucose-Capillary: 436 mg/dL — ABNORMAL HIGH (ref 65–99)
Glucose-Capillary: 227 mg/dL — ABNORMAL HIGH (ref 65–99)

## 2017-08-12 LAB — PROTIME-INR
INR: 1.01
PROTHROMBIN TIME: 13.2 s (ref 11.4–15.2)

## 2017-08-12 LAB — ETHANOL

## 2017-08-12 LAB — TROPONIN I: Troponin I: 0.07 ng/mL (ref <0.03)

## 2017-08-12 LAB — APTT

## 2017-08-12 MED ORDER — HYDROCHLOROTHIAZIDE 12.5 MG PO CAPS
12.5000 mg | ORAL_CAPSULE | Freq: Every day | ORAL | Status: DC
  Administered 2017-08-13 – 2017-08-18 (×6): 12.5 mg via ORAL
  Filled 2017-08-12 (×6): qty 1

## 2017-08-12 MED ORDER — SODIUM CHLORIDE 0.9 % IV SOLN
Freq: Two times a day (BID) | INTRAVENOUS | Status: DC
  Administered 2017-08-12 – 2017-08-13 (×3): via INTRAVENOUS
  Filled 2017-08-12 (×5): qty 5

## 2017-08-12 MED ORDER — LOSARTAN POTASSIUM 50 MG PO TABS
100.0000 mg | ORAL_TABLET | Freq: Every day | ORAL | Status: DC
  Administered 2017-08-13 – 2017-08-18 (×6): 100 mg via ORAL
  Filled 2017-08-12 (×6): qty 2

## 2017-08-12 MED ORDER — LORAZEPAM 2 MG/ML IJ SOLN
1.0000 mg | INTRAMUSCULAR | Status: DC | PRN
  Administered 2017-08-13 – 2017-08-16 (×2): 1 mg via INTRAVENOUS
  Filled 2017-08-12 (×2): qty 1

## 2017-08-12 MED ORDER — SODIUM CHLORIDE 0.9 % IV SOLN
INTRAVENOUS | Status: DC
  Administered 2017-08-12 – 2017-08-14 (×4): via INTRAVENOUS

## 2017-08-12 MED ORDER — SENNOSIDES-DOCUSATE SODIUM 8.6-50 MG PO TABS: 1.0000 | ORAL_TABLET | Freq: Every evening | ORAL | Status: DC | PRN

## 2017-08-12 MED ORDER — LEVETIRACETAM IN NACL 500 MG/100ML IV SOLN
500.0000 mg | Freq: Two times a day (BID) | INTRAVENOUS | Status: DC
  Filled 2017-08-12 (×2): qty 100

## 2017-08-12 MED ORDER — INSULIN GLARGINE 100 UNIT/ML ~~LOC~~ SOLN
10.0000 [IU] | Freq: Every day | SUBCUTANEOUS | Status: DC
  Administered 2017-08-12: 10 [IU] via SUBCUTANEOUS
  Filled 2017-08-12 (×2): qty 0.1

## 2017-08-12 MED ORDER — SODIUM CHLORIDE 0.9 % IV SOLN
Freq: Once | INTRAVENOUS | Status: AC
  Administered 2017-08-12: 17:00:00 via INTRAVENOUS

## 2017-08-12 MED ORDER — LEVETIRACETAM IN NACL 500 MG/100ML IV SOLN: 500.0000 mg | Freq: Two times a day (BID) | INTRAVENOUS | Status: DC

## 2017-08-12 MED ORDER — ASPIRIN 81 MG PO CHEW: 324.0000 mg | CHEWABLE_TABLET | Freq: Once | ORAL | Status: DC

## 2017-08-12 MED ORDER — CLOPIDOGREL BISULFATE 75 MG PO TABS
75.0000 mg | ORAL_TABLET | Freq: Every day | ORAL | Status: DC
  Administered 2017-08-13 – 2017-08-18 (×6): 75 mg via ORAL
  Filled 2017-08-12 (×6): qty 1

## 2017-08-12 MED ORDER — ASPIRIN 81 MG PO CHEW: 324.0000 mg | CHEWABLE_TABLET | Freq: Once | ORAL | Status: AC

## 2017-08-12 MED ORDER — BISACODYL 10 MG RE SUPP: 10.0000 mg | Freq: Every day | RECTAL | Status: DC | PRN

## 2017-08-12 MED ORDER — ASPIRIN 300 MG RE SUPP
300.0000 mg | Freq: Once | RECTAL | Status: AC
  Administered 2017-08-12: 300 mg via RECTAL
  Filled 2017-08-12: qty 1

## 2017-08-12 MED ORDER — INSULIN ASPART 100 UNIT/ML ~~LOC~~ SOLN
8.0000 [IU] | Freq: Once | SUBCUTANEOUS | Status: AC
  Administered 2017-08-12: 8 [IU] via INTRAVENOUS
  Filled 2017-08-12: qty 1

## 2017-08-12 MED ORDER — ATORVASTATIN CALCIUM 20 MG PO TABS
40.0000 mg | ORAL_TABLET | Freq: Every day | ORAL | Status: DC
  Administered 2017-08-13 – 2017-08-17 (×5): 40 mg via ORAL
  Filled 2017-08-12 (×6): qty 2

## 2017-08-12 MED ORDER — NITROGLYCERIN 0.4 MG SL SUBL: 0.4000 mg | SUBLINGUAL_TABLET | SUBLINGUAL | Status: DC | PRN

## 2017-08-12 MED ORDER — ENOXAPARIN SODIUM 40 MG/0.4ML ~~LOC~~ SOLN
40.0000 mg | SUBCUTANEOUS | Status: DC
  Administered 2017-08-13 – 2017-08-17 (×5): 40 mg via SUBCUTANEOUS
  Filled 2017-08-12 (×5): qty 0.4

## 2017-08-12 MED ORDER — ASPIRIN 300 MG RE SUPP
300.0000 mg | Freq: Every day | RECTAL | Status: DC
  Administered 2017-08-13 – 2017-08-14 (×2): 300 mg via RECTAL
  Filled 2017-08-12: qty 1

## 2017-08-12 MED ORDER — STROKE: EARLY STAGES OF RECOVERY BOOK
Freq: Once | Status: AC
  Administered 2017-08-12: 19:00:00

## 2017-08-12 MED ORDER — LOSARTAN POTASSIUM-HCTZ 100-12.5 MG PO TABS: 1.0000 | ORAL_TABLET | Freq: Every day | ORAL | Status: DC

## 2017-08-12 MED ORDER — ASPIRIN 325 MG PO TABS
325.0000 mg | ORAL_TABLET | Freq: Every day | ORAL | Status: DC
  Administered 2017-08-15 – 2017-08-18 (×4): 325 mg via ORAL
  Filled 2017-08-12 (×7): qty 1

## 2017-08-12 NOTE — Progress Notes (Signed)
Shortly after receiving patient on the floor, patient started having seizure like activity. This activity lasted about 30 seconds. Patients son stated that she has had 3 seizures in the past hour and a half and this will be her fourth one. MD notified. Patient started on keppra and PRN ativan. Patient has had several episodes where she is almost in a catatonic state with babbling. Family states they will monitor and notify RN if she has another episode. Ammie Dalton, RN

## 2017-08-12 NOTE — Progress Notes (Signed)
PT Cancellation Note  Patient Details Name: Kristen [name] MRN: [id number] DOB: 21-Dec-1940   Cancelled Treatment:    Reason Eval/Treat Not Completed: Medical issues which prohibited therapy.  Per chart review the pt with hyperglycemia (most recently 47) and RN has noted 3 episodes of seizure (Keppra initiated and Ativan as needed).  Pending neurology consult.  Will hold PT until consult complete and pt more medically appropriate for PT evaluation.     Collie Siad PT, DPT 08/12/2017, 5:53 PM

## 2017-08-12 NOTE — ED Triage Notes (Signed)
Pt arrived via EMS from home with reports of aphasia that started at an unknown time.  Son states pt fell last evening and was at her house from about 8pm until 9pm. Pt was talking normal and c/o back hurting from sliding off of her bed.   Son states he went to her house this morning around 9 or 930 and noticed patient was stuttering and not answering questions.   On arrival to ED, pt had periods of stuttering and aphasia.  Pt would answer questions occasionally. Pt able to tell that today is Easter, but did not answer to what her name was or where she was.  Pt also agitated in room, frequently shifting around

## 2017-08-12 NOTE — Progress Notes (Signed)
Advanced Care Plan.  Purpose of Encounter: CODE STATUS. Parties in Attendance: The patient, her 3 sons and me. Patient's Decisional Capacity: Yes. Medical Story: Kristen [name]  is a 77 y.o. female with a known history of multiple medical problems including CAD, hypertension, diabetes, hyperlipidemia, intracapsular hemorrhage, sleep apnea, stroke etc.  The patient presents the ED with aphasia and is being admitted for acute CVA.  She has multiple medical problems and has had poor prognosis.  I discussed with the patient and her sons about prognosis and CODE STATUS.  The patient wants DNR.  Plan:  Code Status: DNR Time spent discussing advance care planning: 17 minutes.

## 2017-08-12 NOTE — ED Provider Notes (Signed)
[facility] Emergency Department Provider Note    First MD Initiated Contact with Patient 08/12/17 1116     (approximate)  I have reviewed the triage vital signs and the nursing notes.   HISTORY  Chief Complaint Aphasia  Level v caveat:  AMS   HPI Kristen [name] is a 77 y.o. female presents via EMS with altered mental status.  Patient is clearly confused quite fidgety grabbing at her gown.  Will intermittently respond with one-word responses.  She is moving all extremities.  Does have a history of stroke.  EMS reports the patient reportedly had a fall last night.  Last seen normal was sometime last afternoon or evening according to EMS.  No additional history is provided by patient, family or EMS.  Did report that her blood sugar was greater than 500 does have a history of type 2 diabetes reportedly poorly controlled.  Past Medical History:  Diagnosis Date  . Abdominal aortic atherosclerosis ([place]) by CT 02/2014  . CAD (coronary artery disease)    by CT, per pt h/o MI  . Diabetes type 2, uncontrolled ([place])   . Frequent headaches   . GERD (gastroesophageal reflux disease)   . Gout   . History of pulmonary embolism 2012  . HLD (hyperlipidemia)   . HTN (hypertension)   . 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 Morbid obesity ([place])   . Myocardial infarction [facility]) 2012   per pt. report, states she was treated with medicine, here at [facility]    . Osteoarthritis    knees  . Primary localized osteoarthritis of left knee 09/22/2014  . Sleep apnea 2011   study done in [place], states that since she lost weight she doesn't use the CPAP any longer & she doesn't have a problem with sleep apnea  . Stroke [facility])    still has balance problem on occas. , uses cane but that's mainly for the left knee pain  . Syncope 03/01/2016  . Thoracic aortic atherosclerosis ([place]) 12/2015   by  CXR  . Vertigo    hx. benign postitional postural   Family History  Problem Relation Age of Onset  . Cancer Mother        bone  . Diabetes Father   . Hypertension Father   . Cancer Son 31       lung  . Congenital heart disease Son 41  . Stroke Brother    Past Surgical History:  Procedure Laterality Date  . CATARACT EXTRACTION Bilateral 2013  . EYE SURGERY     /w IOL  . FOOT SURGERY Right   . PARTIAL HYSTERECTOMY     for fibroids, ovaries remain  . TONSILLECTOMY    . TOTAL KNEE ARTHROPLASTY Right 1990s  . TOTAL KNEE ARTHROPLASTY Left 09/22/2014   [name], MD  . TUBAL LIGATION     Patient Active Problem List   Diagnosis Date Noted  . Thyroid function test abnormal 03/12/2017  . Disturbance of skin sensation 09/21/2016  . Stroke (cerebrum) ([place]) 09/03/2016  . Hemiparesis of right dominant side ([place]) 09/02/2016  . Intertrigo 08/16/2016  . Vitamin D deficiency 08/08/2016  . Gout 03/24/2016  . Syncope 02/29/2016  . Acute kidney injury ([place]) 02/29/2016  . Concussion 02/29/2016  . Arthritis of left wrist 02/07/2016  . Hemoptysis 12/31/2015  . Thoracic aortic atherosclerosis ([place]) 12/24/2015  . History of CVA (cerebrovascular  accident) 11/03/2015  . Abdominal aortic atherosclerosis ([place])   . Anemia 06/18/2015  . Sensorineural hearing loss (SNHL) of both ears 03/29/2015  . Axillary mass 03/10/2015  . Pain of right middle finger 12/22/2014  . Memory deficit 12/04/2014  . Primary osteoarthritis of left knee 01/27/2014  . Advanced care planning/counseling discussion 12/11/2013  . Medicare annual wellness visit, subsequent 12/11/2013  . Health maintenance examination 12/11/2013  . CAD (coronary artery disease)/coronary calcifications   . Chest pain 07/17/2013  . Benign paroxysmal positional vertigo 05/26/2013  . HLD (hyperlipidemia)   . Obesity, Class II, BMI 35-39.9, with comorbidity ([place])   . History of pulmonary embolism   . Osteoarthritis   . Diabetes type 2,  uncontrolled ([place])   . History of DVT (deep vein thrombosis) 12/21/2010  . Hypertension, uncontrolled 05/13/2009      Prior to Admission medications   Medication Sig Start Date End Date Taking? Authorizing Provider  Blood Glucose Monitoring Suppl (ONE TOUCH ULTRA SYSTEM KIT) w/Device KIT 1 kit by Does not apply route once. 09/17/15  Yes [name], MD  Cholecalciferol (VITAMIN D) 2000 units CAPS Take 1 capsule by mouth daily.   Yes [provider]  clopidogrel (PLAVIX) 75 MG tablet Take 1 tablet (75 mg total) by mouth daily. 10/03/16  Yes [name], MD  clotrimazole (LOTRIMIN AF) 1 % cream Apply 1 application topically 2 (two) times daily. Rash under breast 08/16/16  Yes [name], MD  Colchicine (MITIGARE) 0.6 MG CAPS Take 1 tablet by mouth daily as needed. 09/21/16  Yes [name], MD  glimepiride (AMARYL) 4 MG tablet Take 1 tablet (4 mg total) by mouth daily with breakfast. 08/16/16  Yes [name], MD  glucose blood test strip Ck blood sugar twice a day and as directed. 09/17/15  Yes [name], MD  losartan-hydrochlorothiazide [facility]) 100-12.5 MG tablet Take 1 tablet by mouth daily. 10/03/16  Yes [name], MD  nitroGLYCERIN (NITROSTAT) 0.4 MG SL tablet Place 0.4 mg under the tongue every 5 (five) minutes as needed for chest pain.   Yes [provider]  [facility] DELICA LANCETS 78H MISC Check blood sugar twice a day and as directed. 09/17/15  Yes [name], MD  potassium chloride (K-DUR) 10 MEQ tablet Take 1 tablet (10 mEq total) by mouth daily. Patient taking differently: Take 10 mEq by mouth every Monday, Wednesday, and Friday.  08/16/16  Yes [name], MD  simvastatin (ZOCOR) 20 MG tablet Take 20 mg by mouth daily.  11/10/16  Yes [provider]  atorvastatin (LIPITOR) 20 MG tablet Take 1 tablet (20 mg total) by mouth daily at 6 PM. Patient not taking: Reported on 08/12/2017 09/14/16   [name],  MD  pioglitazone (ACTOS) 30 MG tablet Take 1 tablet (30 mg total) by mouth daily. Patient not taking: Reported on 08/12/2017 10/03/16   [name], MD  pioglitazone-glimepiride (DUETACT) 30-2 MG tablet Take 1 tablet by mouth daily with breakfast. Patient not taking: Reported on 08/12/2017 12/14/16   [name], MD    Allergies Acetaminophen; Aleve [naproxen sodium]; Doxycycline; Metformin and related; and Penicillins    Social History Social History   Tobacco Use  . Smoking status: Never Smoker  . Smokeless tobacco: Never Used  . Tobacco comment: tobacco use- no   Substance Use Topics  . Alcohol use: Yes    Alcohol/week: 1.2 oz    Types: 1 Glasses of wine, 1 Cans of beer per week    Comment: Rare  .  Drug use: No    Review of Systems Unable to assess 2/2 acute encephalopathy ____________________________________________   PHYSICAL EXAM:  VITAL SIGNS: Vitals:   08/12/17 1126  BP: (!) 160/84  Pulse: 100  Resp: 16  Temp: 98.3 F (36.8 C)  SpO2: 97%    Constitutional: Alert, disoriented and encephalopathic but protecting airway Eyes: Conjunctivae are normal.  Head: Atraumatic. Nose: No congestion/rhinnorhea. Mouth/Throat: Mucous membranes are moist.   Neck: No stridor. Painless ROM.  Cardiovascular: Normal rate, regular rhythm. Grossly normal heart sounds.  Good peripheral circulation. Respiratory: Normal respiratory effort.  No retractions. Lungs CTAB. Gastrointestinal: Soft and nontender. No distention. No abdominal bruits. No CVA tenderness. Musculoskeletal: No lower extremity tenderness nor edema.  No joint effusions. Neurologic:  Aphasic, no facial droop, MAE spontaneously,  Skin:  Skin is warm, dry and intact. No rash noted.   ____________________________________________   LABS (all labs ordered are listed, but only abnormal results are displayed)  Results for orders placed or performed during the hospital encounter of 08/12/17 (from the past  24 hour(s))  Ethanol     Status: None   Collection Time: 08/12/17 11:31 AM  Result Value Ref Range   Alcohol, Ethyl (B) <10 <10 mg/dL  Protime-INR     Status: None   Collection Time: 08/12/17 11:31 AM  Result Value Ref Range   Prothrombin Time 13.2 11.4 - 15.2 seconds   INR 1.01   APTT     Status: Abnormal   Collection Time: 08/12/17 11:31 AM  Result Value Ref Range   aPTT <24 (L) 24 - 36 seconds  CBC     Status: Abnormal   Collection Time: 08/12/17 11:31 AM  Result Value Ref Range   WBC 11.6 (H) 3.6 - 11.0 K/uL   RBC 4.55 3.80 - 5.20 MIL/uL   Hemoglobin 12.4 12.0 - 16.0 g/dL   HCT 38.9 35.0 - 47.0 %   MCV 85.5 80.0 - 100.0 fL   MCH 27.4 26.0 - 34.0 pg   MCHC 32.0 32.0 - 36.0 g/dL   RDW 16.0 (H) 11.5 - 14.5 %   Platelets 247 150 - 440 K/uL  Differential     Status: Abnormal   Collection Time: 08/12/17 11:31 AM  Result Value Ref Range   Neutrophils Relative % 81 %   Neutro Abs 9.4 (H) 1.4 - 6.5 K/uL   Lymphocytes Relative 14 %   Lymphs Abs 1.6 1.0 - 3.6 K/uL   Monocytes Relative 5 %   Monocytes Absolute 0.5 0.2 - 0.9 K/uL   Eosinophils Relative 0 %   Eosinophils Absolute 0.0 0 - 0.7 K/uL   Basophils Relative 0 %   Basophils Absolute 0.0 0 - 0.1 K/uL  Comprehensive metabolic panel     Status: Abnormal   Collection Time: 08/12/17 11:31 AM  Result Value Ref Range   Sodium 131 (L) 135 - 145 mmol/L   Potassium 4.3 3.5 - 5.1 mmol/L   Chloride 95 (L) 101 - 111 mmol/L   CO2 28 22 - 32 mmol/L   Glucose, Bld 532 (HH) 65 - 99 mg/dL   BUN 23 (H) 6 - 20 mg/dL   Creatinine, Ser 1.35 (H) 0.44 - 1.00 mg/dL   Calcium 8.8 (L) 8.9 - 10.3 mg/dL   Total Protein 7.1 6.5 - 8.1 g/dL   Albumin 3.7 3.5 - 5.0 g/dL   AST 31 15 - 41 U/L   ALT 16 14 - 54 U/L   Alkaline Phosphatase 85 38 - 126 U/L  Total Bilirubin 1.3 (H) 0.3 - 1.2 mg/dL   GFR calc non Af Amer 37 (L) >60 mL/min   GFR calc Af Amer 43 (L) >60 mL/min   Anion gap 8 5 - 15  Troponin I     Status: Abnormal   Collection Time:  08/12/17 11:31 AM  Result Value Ref Range   Troponin I 0.07 (HH) <0.03 ng/mL  Glucose, capillary     Status: Abnormal   Collection Time: 08/12/17  1:10 PM  Result Value Ref Range   Glucose-Capillary 436 (H) 65 - 99 mg/dL  Urine Drug Screen, Qualitative     Status: None   Collection Time: 08/12/17  1:15 PM  Result Value Ref Range   Tricyclic, Ur Screen NONE DETECTED NONE DETECTED   Amphetamines, Ur Screen NONE DETECTED NONE DETECTED   MDMA (Ecstasy)Ur Screen NONE DETECTED NONE DETECTED   Cocaine Metabolite,Ur [place] NONE DETECTED NONE DETECTED   Opiate, Ur Screen NONE DETECTED NONE DETECTED   Phencyclidine (PCP) Ur S NONE DETECTED NONE DETECTED   Cannabinoid 50 Ng, Ur [place] NONE DETECTED NONE DETECTED   Barbiturates, Ur Screen NONE DETECTED NONE DETECTED   Benzodiazepine, Ur Scrn NONE DETECTED NONE DETECTED   Methadone Scn, Ur NONE DETECTED NONE DETECTED  Urinalysis, Complete w Microscopic     Status: Abnormal   Collection Time: 08/12/17  1:15 PM  Result Value Ref Range   Color, Urine YELLOW (A) YELLOW   APPearance HAZY (A) CLEAR   Specific Gravity, Urine 1.027 1.005 - 1.030   pH 5.0 5.0 - 8.0   Glucose, UA >=500 (A) NEGATIVE mg/dL   Hgb urine dipstick SMALL (A) NEGATIVE   Bilirubin Urine NEGATIVE NEGATIVE   Ketones, ur 5 (A) NEGATIVE mg/dL   Protein, ur 30 (A) NEGATIVE mg/dL   Nitrite NEGATIVE NEGATIVE   Leukocytes, UA NEGATIVE NEGATIVE   RBC / HPF 0-5 0 - 5 RBC/hpf   WBC, UA 0-5 0 - 5 WBC/hpf   Squamous Epithelial / LPF 0-5 (A) NONE SEEN   ____________________________________________  EKG My review and personal interpretation at Time: 11:26   Indication: ams  Rate: 100  Rhythm: sinus Axis: normal Other: normal intervals, no stemi ____________________________________________  RADIOLOGY  I personally reviewed all radiographic images ordered to evaluate for the above acute complaints and reviewed radiology reports and findings.  These findings were personally discussed with the  patient.  Please see medical record for radiology report.  ____________________________________________   PROCEDURES  Procedure(s) performed:  Procedures    Critical Care performed: no ____________________________________________   INITIAL IMPRESSION / ASSESSMENT AND PLAN / ED COURSE  Pertinent labs & imaging results that were available during my care of the patient were reviewed by me and considered in my medical decision making (see chart for details).  DDX: Dehydration, sepsis, pna, uti, hypoglycemia, cva, drug effect, withdrawal, encephalitis   Kristen [name] is a 77 y.o. who presents to the ED with confusion and altered mental status as described above.  CT head ordered to evaluate for trauma and possible bleed given report of possible fall last night.  Patient moving all extremities at this time but does have significant expressive aphasia.  Seems to somewhat understand what is going on but is otherwise quite encephalopathic.  Will evaluate for metabolic as well as vascular pathology.  Clinical Course as of Aug 13 1351  Sun Aug 12, 2017  1217 CT head shows no acute intracranial abnormality recently no subdural or bleed.  Does have mild leukocytosis.  Do suspect some component of hyperglycemia possible UTI.   [PR]  1342 Patient reassessed.  Does seem to have some word finding difficulties and expressive aphasia.  Seems to have some understanding of what is going on but I am concern for stroke and do feel patient will require hospitalization for CVA workup, as well as management of hyperglycemia and further evaluation of her confusion and aphasia.  Have discussed with the patient and available family all diagnostics and treatments performed thus far and all questions were answered to the best of my ability. The patient demonstrates understanding and agreement with plan.    [PR]    Clinical Course User Index [PR] Merlyn Lot, MD     As part of my medical decision  making, I reviewed the following data within the [place] notes reviewed and incorporated, Labs reviewed, notes from prior ED visits and [place] Controlled Substance Database   ____________________________________________   FINAL CLINICAL IMPRESSION(S) / ED DIAGNOSES  Final diagnoses:  Aphasia  Altered mental status, unspecified altered mental status type  Hyperglycemia      NEW MEDICATIONS STARTED DURING THIS VISIT:  New Prescriptions   No medications on file     Note:  This document was prepared using Dragon voice recognition software and may include unintentional dictation errors.    Merlyn Lot, MD 08/12/17 1353

## 2017-08-12 NOTE — ED Notes (Signed)
Pt unable to comprehend when completing NIH, pt continues to stutter occasionally and not speak at all when asked questions.  NIH completed to the best of my ability.

## 2017-08-12 NOTE — H&P (Addendum)
[place] [facility] [place] NAME: Kristen [name]    MR#:  [id number]  DATE OF BIRTH:  Jul 04, 1940  DATE OF ADMISSION:  08/12/2017  PRIMARY CARE PHYSICIAN: [name], MD   REQUESTING/REFERRING PHYSICIAN: Dr. Quentin Cornwall.  CHIEF COMPLAINT:   Chief Complaint  Patient presents with  . Aphasia   Aphasia and confusion today. HISTORY OF PRESENT ILLNESS:  Kristen [name]  is a 77 y.o. female with a known history of multiple medical problems as below.  The patient is sent to ED from home due to above chief complaints.  The patient has recent stroke last year, has been on aspirin and Plavix.  Per her son, she patient was noticed to have aphasia and confusion intermittently since this morning.  She still has aphasia in the ED.  CAT scan of the head is unremarkable.  She has hyperglycemia at 532. PAST MEDICAL HISTORY:   Past Medical History:  Diagnosis Date  . Abdominal aortic atherosclerosis ([place]) by CT 02/2014  . CAD (coronary artery disease)    by CT, per pt h/o MI  . Diabetes type 2, uncontrolled ([place])   . Frequent headaches   . GERD (gastroesophageal reflux disease)   . Gout   . History of pulmonary embolism 2012  . HLD (hyperlipidemia)   . HTN (hypertension)   . 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 Morbid obesity ([place])   . Myocardial infarction [facility]) 2012   per pt. report, states she was treated with medicine, here at [facility]    . Osteoarthritis    knees  . Primary localized osteoarthritis of left knee 09/22/2014  . Sleep apnea 2011   study done in [place], states that since she lost weight she doesn't use the CPAP any longer & she doesn't have a problem with sleep apnea  . Stroke [facility])    still has balance problem on occas. , uses cane but that's mainly for the left knee pain  . Syncope 03/01/2016  . Thoracic aortic atherosclerosis ([place]) 12/2015   by CXR  . Vertigo    hx. benign postitional postural    PAST SURGICAL HISTORY:   Past Surgical History:  Procedure Laterality Date  . CATARACT EXTRACTION Bilateral 2013  . EYE SURGERY     /w IOL  . FOOT SURGERY Right   . PARTIAL HYSTERECTOMY     for fibroids, ovaries remain  . TONSILLECTOMY    . TOTAL KNEE ARTHROPLASTY Right 1990s  . TOTAL KNEE ARTHROPLASTY Left 09/22/2014   [name], MD  . TUBAL LIGATION      SOCIAL HISTORY:   Social History   Tobacco Use  . Smoking status: Never Smoker  . Smokeless tobacco: Never Used  . Tobacco comment: tobacco use- no   Substance Use Topics  . Alcohol use: Yes    Alcohol/week: 1.2 oz    Types: 1 Glasses of wine, 1 Cans of beer per week    Comment: Rare    FAMILY HISTORY:   Family History  Problem Relation Age of Onset  . Cancer Mother        bone  . Diabetes Father   . Hypertension Father   . Cancer Son 31       lung  . Congenital heart disease Son 71  . Stroke Brother     DRUG ALLERGIES:   Allergies  Allergen Reactions  .  Acetaminophen Other (See Comments)    "Causes me to spit up blood"  . Aleve [Naproxen Sodium] Other (See Comments)    Spits up blood  . Doxycycline Other (See Comments)    Malaise, GI upset, "felt drunk" and very ill  . Metformin And Related Other (See Comments)    Chills, dizziness  . Penicillins Rash    Has patient had a PCN reaction causing immediate rash, facial/tongue/throat swelling, SOB or lightheadedness with hypotension: Yes Has patient had a PCN reaction causing severe rash involving mucus membranes or skin necrosis: No Has patient had a PCN reaction that required hospitalization No Has patient had a PCN reaction occurring within the last 10 years: No If all of the above answers are "NO", then may proceed with Cephalosporin use.     REVIEW OF SYSTEMS:   Review of Systems  Unable to perform ROS: Acuity of condition    MEDICATIONS AT HOME:   Prior to Admission  medications   Medication Sig Start Date End Date Taking? Authorizing Provider  Blood Glucose Monitoring Suppl (ONE TOUCH ULTRA SYSTEM KIT) w/Device KIT 1 kit by Does not apply route once. 09/17/15  Yes [name], MD  Cholecalciferol (VITAMIN D) 2000 units CAPS Take 1 capsule by mouth daily.   Yes [provider]  clopidogrel (PLAVIX) 75 MG tablet Take 1 tablet (75 mg total) by mouth daily. 10/03/16  Yes [name], MD  clotrimazole (LOTRIMIN AF) 1 % cream Apply 1 application topically 2 (two) times daily. Rash under breast 08/16/16  Yes [name], MD  Colchicine (MITIGARE) 0.6 MG CAPS Take 1 tablet by mouth daily as needed. 09/21/16  Yes [name], MD  glimepiride (AMARYL) 4 MG tablet Take 1 tablet (4 mg total) by mouth daily with breakfast. 08/16/16  Yes [name], MD  glucose blood test strip Ck blood sugar twice a day and as directed. 09/17/15  Yes [name], MD  losartan-hydrochlorothiazide [facility]) 100-12.5 MG tablet Take 1 tablet by mouth daily. 10/03/16  Yes [name], MD  nitroGLYCERIN (NITROSTAT) 0.4 MG SL tablet Place 0.4 mg under the tongue every 5 (five) minutes as needed for chest pain.   Yes [provider]  [facility] DELICA LANCETS 03K MISC Check blood sugar twice a day and as directed. 09/17/15  Yes [name], MD  potassium chloride (K-DUR) 10 MEQ tablet Take 1 tablet (10 mEq total) by mouth daily. Patient taking differently: Take 10 mEq by mouth every Monday, Wednesday, and Friday.  08/16/16  Yes [name], MD  simvastatin (ZOCOR) 20 MG tablet Take 20 mg by mouth daily.  11/10/16  Yes [provider]  atorvastatin (LIPITOR) 20 MG tablet Take 1 tablet (20 mg total) by mouth daily at 6 PM. Patient not taking: Reported on 08/12/2017 09/14/16   [name], MD  pioglitazone (ACTOS) 30 MG tablet Take 1 tablet (30 mg total) by mouth daily. Patient not taking: Reported on 08/12/2017 10/03/16    [name], MD  pioglitazone-glimepiride (DUETACT) 30-2 MG tablet Take 1 tablet by mouth daily with breakfast. Patient not taking: Reported on 08/12/2017 12/14/16   [name], MD      VITAL SIGNS:  Blood pressure (!) 157/83, pulse 64, temperature 98.3 F (36.8 C), temperature source Oral, resp. rate 13, height _0  (1.626 m), weight 220 lb (99.8 kg), SpO2 100 %.  PHYSICAL EXAMINATION:  Physical Exam  GENERAL:  77 y.o.-year-old patient lying in the bed with no acute distress.  Obesity. EYES: Pupils equal,  round, reactive to light and accommodation. No scleral icterus. Extraocular muscles intact.  HEENT: Head atraumatic, normocephalic. Oropharynx and nasopharynx clear.  NECK:  Supple, no jugular venous distention. No thyroid enlargement, no tenderness.  LUNGS: Normal breath sounds bilaterally, no wheezing, rales,rhonchi or crepitation. No use of accessory muscles of respiration.  CARDIOVASCULAR: S1, S2 normal. No murmurs, rubs, or gallops.  ABDOMEN: Soft, nontender, nondistended. Bowel sounds present. No organomegaly or mass.  EXTREMITIES: No pedal edema, cyanosis, or clubbing.  NEUROLOGIC: Aphasia and slurred speech.  Muscle strength 5/5 in right extremities, 4/5 in left extremities. Sensation intact. Gait not checked.  PSYCHIATRIC: The patient is alert and oriented x 3.  SKIN: No obvious rash, lesion, or ulcer.   LABORATORY PANEL:   CBC Recent Labs  Lab 08/12/17 1131  WBC 11.6*  HGB 12.4  HCT 38.9  PLT 247   ------------------------------------------------------------------------------------------------------------------  Chemistries  Recent Labs  Lab 08/12/17 1131  NA 131*  K 4.3  CL 95*  CO2 28  GLUCOSE 532*  BUN 23*  CREATININE 1.35*  CALCIUM 8.8*  AST 31  ALT 16  ALKPHOS 85  BILITOT 1.3*   ------------------------------------------------------------------------------------------------------------------  Cardiac Enzymes Recent Labs  Lab  08/12/17 1131  TROPONINI 0.07*   ------------------------------------------------------------------------------------------------------------------  RADIOLOGY:  Dg Chest 2 View  Result Date: 08/12/2017 CLINICAL DATA:  Patient poor historian at this time. Per ED RN notes: Pt arrived via EMS from home with reports of aphasia that started at an unknown time. Son states pt fell last evening and was at her house from about 8pm until 9pm. EXAM: CHEST - 2 VIEW COMPARISON:  02/29/2016 FINDINGS: Heart size is accentuated by the AP technique. There are no focal consolidations or pleural effusions. No pulmonary edema. There is midthoracic spondylosis. IMPRESSION: No active cardiopulmonary disease. Electronically Signed   By: Nolon Nations M.D.   On: 08/12/2017 13:16   Ct Head Wo Contrast  Result Date: 08/12/2017 CLINICAL DATA:  Aphasia.  Fell last night. EXAM: CT HEAD WITHOUT CONTRAST TECHNIQUE: Contiguous axial images were obtained from the base of the skull through the vertex without intravenous contrast. COMPARISON:  Brain MR dated 09/02/2016 and head CT dated 09/02/2016. FINDINGS: Brain: Diffusely enlarged ventricles and subarachnoid spaces. Patchy white matter low density in both cerebral hemispheres. No intracranial hemorrhage, mass lesion or CT evidence of acute infarction. Vascular: No hyperdense vessel or unexpected calcification. Skull: Normal. Negative for fracture or focal lesion. Sinuses/Orbits: Unremarkable. Other: None. IMPRESSION: 1. No acute abnormality. 2. Mild diffuse cerebral and cerebellar atrophy and mild chronic small vessel white matter ischemic changes in both cerebral hemispheres. Electronically Signed   By: Claudie Revering M.D.   On: 08/12/2017 12:12      IMPRESSION AND PLAN:   Acute CVA with aphagia.  History of a CVA last year. Patient will be admitted to medical floor. Start aspirin 300 mg suppository, speech study, MRI/MRA of the brain, carotid duplex and  echocardiograph.  Acute renal failure with dehydration.  Normal saline IV and follow-up BMP.  Hypertension, continue home hypertension medication if pus speech study. Hyperglycemia due to diabetes 2. Start sliding scale.  Lantus at bedtime.  Adjust dose depending on blood glucose.  Seizure. Per RN, the patient has had 3 episodes of seizure, which lasted about 30 seconds. Start Keppra IV twice daily, Ativan as needed and follow neurology consult. Aspiration and seizure precaution.  All the records are reviewed and case discussed with ED provider. Management plans discussed with the patient, 3 sons and they  are in agreement.  CODE STATUS: DNR  TOTAL TIME TAKING CARE OF THIS PATIENT: 52 minutes.    Demetrios Loll M.D on 08/12/2017 at 2:11 PM  Between 7am to 6pm - Pager - [phone]  After 6pm go to [email or web address] - Proofreader  [facility]  Office  [phone]  CC: Primary care physician; [name], MD   Note: This dictation was prepared with Dragon dictation along with smaller phrase technology. Any transcriptional errors that result from this process are unin

## 2017-08-13 ENCOUNTER — Other Ambulatory Visit: Payer: Medicare Other

## 2017-08-13 ENCOUNTER — Inpatient Hospital Stay (HOSPITAL_COMMUNITY): Payer: Medicare Other

## 2017-08-13 ENCOUNTER — Ambulatory Visit: Payer: Medicare Other

## 2017-08-13 ENCOUNTER — Inpatient Hospital Stay: Payer: Medicare Other

## 2017-08-13 ENCOUNTER — Inpatient Hospital Stay (HOSPITAL_COMMUNITY)
Admit: 2017-08-13 | Discharge: 2017-08-13 | Disposition: A | Discharge status: Home / Self Care | Payer: Medicare Other | Attending: Internal Medicine | Admitting: Internal Medicine

## 2017-08-13 DIAGNOSIS — I6789 Other cerebrovascular disease: Secondary | ICD-10-CM

## 2017-08-13 DIAGNOSIS — R569 Unspecified convulsions: Secondary | ICD-10-CM

## 2017-08-13 LAB — BLOOD GAS, ARTERIAL
ACID-BASE EXCESS: 3.9 mmol/L — AB (ref 0.0–2.0)
BICARBONATE: 28.5 mmol/L — AB (ref 20.0–28.0)
FIO2: 0.21
O2 Saturation: 96.4 %
PH ART: 7.44 (ref 7.350–7.450)
Patient temperature: 37
pCO2 arterial: 42 mmHg (ref 32.0–48.0)
pO2, Arterial: 82 mmHg — ABNORMAL LOW (ref 83.0–108.0)

## 2017-08-13 LAB — LIPID PANEL
CHOL/HDL RATIO: 3.2 ratio
Cholesterol: 147 mg/dL (ref 0–200)
HDL: 46 mg/dL (ref >40)
LDL CALC: 83 mg/dL (ref 0–99)
TRIGLYCERIDES: 91 mg/dL (ref <150)
VLDL: 18 mg/dL (ref 0–40)

## 2017-08-13 LAB — ECHOCARDIOGRAM COMPLETE
Height: 64 in
WEIGHTICAEL: 3520 [oz_av]

## 2017-08-13 LAB — HEMOGLOBIN A1C
Hgb A1c MFr Bld: 13.1 % — ABNORMAL HIGH (ref 4.8–5.6)
Mean Plasma Glucose: 329.27 mg/dL

## 2017-08-13 LAB — GLUCOSE, CAPILLARY
GLUCOSE-CAPILLARY: 285 mg/dL — AB (ref 65–99)
GLUCOSE-CAPILLARY: 236 mg/dL — AB (ref 65–99)
Glucose-Capillary: 344 mg/dL — ABNORMAL HIGH (ref 65–99)
Glucose-Capillary: 161 mg/dL — ABNORMAL HIGH (ref 65–99)

## 2017-08-13 MED ORDER — LIVING WELL WITH DIABETES BOOK
Freq: Once | Status: AC
  Administered 2017-08-13: 22:00:00
  Filled 2017-08-13 (×4): qty 1

## 2017-08-13 MED ORDER — INSULIN ASPART 100 UNIT/ML ~~LOC~~ SOLN
0.0000 [IU] | Freq: Three times a day (TID) | SUBCUTANEOUS | Status: DC
  Administered 2017-08-13: 13:00:00 3 [IU] via SUBCUTANEOUS
  Administered 2017-08-13: 7 [IU] via SUBCUTANEOUS
  Administered 2017-08-14: 09:00:00 2 [IU] via SUBCUTANEOUS
  Administered 2017-08-14: 7 [IU] via SUBCUTANEOUS
  Administered 2017-08-15: 2 [IU] via SUBCUTANEOUS
  Administered 2017-08-15: 3 [IU] via SUBCUTANEOUS
  Filled 2017-08-13 (×6): qty 1

## 2017-08-13 MED ORDER — INSULIN GLARGINE 100 UNIT/ML ~~LOC~~ SOLN
15.0000 [IU] | Freq: Every day | SUBCUTANEOUS | Status: DC
  Administered 2017-08-13 – 2017-08-15 (×3): 15 [IU] via SUBCUTANEOUS
  Filled 2017-08-13 (×5): qty 0.15

## 2017-08-13 NOTE — Progress Notes (Signed)
Notified Dr Posey Pronto of pt been more awake and alert prior to EEG, able to answer appropriately, speech clear, but delayed responses and was able to follow commands. Post EEG upon return to unit pt drowsy, mumbles only and unable to follow command. VSS. No seizure activity noted. Notified Dr Posey Pronto of the above, per MD to check CBG and ABG's and to continue to monitor. Notified Dr Doy Mince as well. Per Dr Doy Mince to continue to monitor.

## 2017-08-13 NOTE — Progress Notes (Signed)
[place] at [facility]                                                                                                                                                                                  Patient Demographics   Kristen [name], is a 77 y.o. female, DOB - [birth year], ZMO:[id number]  Admit date - 08/12/2017   Admitting Physician Demetrios Loll, MD  Outpatient Primary MD for the patient is [name], MD   LOS - 1  Subjective: Patient admitted with possible CVA however noted to have seizures yesterday after being admitted. Patient was not speaking earlier but now able to speak and follow commands   Review of Systems:   CONSTITUTIONAL: Drowsy but able to follow commands  Vitals:   Vitals:   08/13/17 0158 08/13/17 0421 08/13/17 1051 08/13/17 1230  BP: 137/60 (!) 159/75 (!) 159/87 (!) 175/77  Pulse: (!) 59 64 65 61  Resp: 15 17 17 16   Temp: 98.2 F (36.8 C) 98.1 F (36.7 C) 97.6 F (36.4 C) 98.5 F (36.9 C)  TempSrc: Oral Oral Axillary Oral  SpO2: 100% 100% 99% 100%  Weight:      Height:        Wt Readings from Last 3 Encounters:  08/12/17 99.8 kg (220 lb)  03/12/17 96.6 kg (213 lb)  12/01/16 96.2 kg (212 lb)     Intake/Output Summary (Last 24 hours) at 08/13/2017 1406 Last data filed at 08/13/2017 0307 Gross per 24 hour  Intake 668.75 ml  Output -  Net 668.75 ml    Physical Exam:   GENERAL: Pleasant-appearing in no apparent distress.  HEAD, EYES, EARS, NOSE AND THROAT: Atraumatic, normocephalic. Extraocular muscles are intact. Pupils equal and reactive to light. Sclerae anicteric. No conjunctival injection. No oro-pharyngeal erythema.  NECK: Supple. There is no jugular venous distention. No bruits, no lymphadenopathy, no thyromegaly.  HEART: Regular rate and rhythm,. No murmurs, no rubs, no clicks.  LUNGS: Clear to auscultation bilaterally. No rales or rhonchi. No wheezes.  ABDOMEN: Soft, flat, nontender, nondistended. Has  good bowel sounds. No hepatosplenomegaly appreciated.  EXTREMITIES: No evidence of any cyanosis, clubbing, or peripheral edema.  +2 pedal and radial pulses bilaterally.  NEUROLOGIC: The patient is drowsy no focal motor or sensory deficits appreciated bilaterally.  SKIN: Moist and warm with no rashes appreciated.  Psych: Not anxious, depressed LN: No inguinal LN enlargement    Antibiotics   Anti-infectives (From admission, onward)   None      Medications   Scheduled Meds: . aspirin  300 mg Rectal Daily   Or  . aspirin  325 mg Oral Daily  . atorvastatin  40  mg Oral q1800  . clopidogrel  75 mg Oral Daily  . enoxaparin (LOVENOX) injection  40 mg Subcutaneous Q24H  . hydrochlorothiazide  12.5 mg Oral Daily  . insulin aspart  0-9 Units Subcutaneous TID WC  . insulin glargine  10 Units Subcutaneous QHS  . living well with diabetes book   Does not apply Once  . losartan  100 mg Oral Daily   Continuous Infusions: . sodium chloride 75 mL/hr at 08/12/17 2351  . small volume/piggyback builder Stopped (08/13/17 1119)   PRN Meds:.bisacodyl, LORazepam, nitroGLYCERIN, senna-docusate   Data Review:   Micro Results No results found for this or any previous visit (from the past 240 hour(s)).  Radiology Reports Dg Chest 2 View  Result Date: 08/12/2017 CLINICAL DATA:  Patient poor historian at this time. Per ED RN notes: Pt arrived via EMS from home with reports of aphasia that started at an unknown time. Son states pt fell last evening and was at her house from about 8pm until 9pm. EXAM: CHEST - 2 VIEW COMPARISON:  02/29/2016 FINDINGS: Heart size is accentuated by the AP technique. There are no focal consolidations or pleural effusions. No pulmonary edema. There is midthoracic spondylosis. IMPRESSION: No active cardiopulmonary disease. Electronically Signed   By: Nolon Nations M.D.   On: 08/12/2017 13:16   Ct Head Wo Contrast  Result Date: 08/12/2017 CLINICAL DATA:  Aphasia.  Fell  last night. EXAM: CT HEAD WITHOUT CONTRAST TECHNIQUE: Contiguous axial images were obtained from the base of the skull through the vertex without intravenous contrast. COMPARISON:  Brain MR dated 09/02/2016 and head CT dated 09/02/2016. FINDINGS: Brain: Diffusely enlarged ventricles and subarachnoid spaces. Patchy white matter low density in both cerebral hemispheres. No intracranial hemorrhage, mass lesion or CT evidence of acute infarction. Vascular: No hyperdense vessel or unexpected calcification. Skull: Normal. Negative for fracture or focal lesion. Sinuses/Orbits: Unremarkable. Other: None. IMPRESSION: 1. No acute abnormality. 2. Mild diffuse cerebral and cerebellar atrophy and mild chronic small vessel white matter ischemic changes in both cerebral hemispheres. Electronically Signed   By: Claudie Revering M.D.   On: 08/12/2017 12:12   Mr Brain Wo Contrast  Result Date: 08/13/2017 CLINICAL DATA:  Aphasia.  Hyperglycemia. EXAM: MRI HEAD WITHOUT CONTRAST MRA HEAD WITHOUT CONTRAST TECHNIQUE: Multiplanar, multiecho pulse sequences of the brain and surrounding structures were obtained without intravenous contrast. Angiographic images of the head were obtained using MRA technique without contrast. COMPARISON:  Limited brain MRI 08/12/2017. Head CT 08/12/2017. Brain MRI/MRA 09/02/2016. FINDINGS: MRI HEAD FINDINGS Brain: No acute infarct, mass, midline shift, or extra-axial fluid collection is identified. Chronic microhemorrhages are again seen in the thalami, right lentiform nucleus, and pons suggesting chronic hypertension as the etiology. Patchy T2 hyperintensities in the cerebral white matter, thalami, and pons were more conspicuous on the 2018 MRI (less motion and higher magnetic field strength) but have likely not significantly changed and are nonspecific but compatible with moderate chronic small vessel ischemic disease. Chronic lacunar infarcts are present in the left corona radiata, posterior left frontal  subcortical white matter, and right cerebellum. Mild cerebral atrophy is within normal limits for age. Vascular: Major intracranial vascular flow voids are preserved. Skull and upper cervical spine: Unremarkable calvarial bone marrow signal. Chronic bone marrow heterogeneity in the visualized cervical spine with underlying disc degeneration. Sinuses/Orbits: Bilateral cataract extraction. Paranasal sinuses and mastoid air cells are clear. Other: None. MRA HEAD FINDINGS The study is moderately motion degraded. The visualized distal vertebral arteries are patent to the  basilar with the left being dominant. There is an apparent moderate distal right V4 stenosis which is likely accentuated by motion artifact. PICAs, AICAs, and SCAs are grossly patent though not well evaluated due to motion. The basilar artery is patent without evidence of significant stenosis allowing for prominent motion artifact distally. PCAs are patent without evidence of flow limiting proximal stenosis. There is an improved appearance of the right P1-P2 junction suggesting that the moderate narrowing on the prior MRA was largely artifactual. The internal carotid arteries are patent from skull base to carotid termini with prominent motion artifact through the cavernous segments and no evidence of significant ICA stenosis elsewhere. The MCAs are patent without evidence of significant M1 stenosis or proximal branch occlusion although motion artifact limits branch vessel evaluation. There is rather extensive motion artifact through the ACAs with the right A1 segment being poorly visualized, particularly proximally. No sizable aneurysm is identified. IMPRESSION: 1. No acute intracranial abnormality. 2. Moderate chronic small vessel ischemic disease. 3. Motion degraded MRA. Poorly visualized right A1 segment for which a high-grade proximal stenosis is not excluded. 4. No evidence of high-grade intracranial ICA or M1 stenosis. 5. Moderate distal right V4  stenosis versus artifact. Electronically Signed   By: Logan Bores M.D.   On: 08/13/2017 11:25   Mr Brain Wo Contrast  Result Date: 08/12/2017 CLINICAL DATA:  Altered mental status. Fell last night. Hyperglycemia. History of poorly controlled diabetes, stroke, hypertension, hyperlipidemia. EXAM: MRI HEAD WITHOUT CONTRAST TECHNIQUE: Sagittal T1, axial and coronal diffusion weighted imaging, axial T2 sequences obtained. Patient terminated the examination by climbing out of the scanner. COMPARISON:  CT HEAD August 12, 2016 and MRI of the head Sep 02, 2016 FINDINGS: Moderately motion degraded examination. INTRACRANIAL CONTENTS: No reduced diffusion to suggest acute ischemia, hypercellular tumor or infection. Limited assessment for blood products, no lobar hematoma. Old LEFT basal ganglia versus corona radiata infarct. Old small cerebellar infarcts. No hydrocephalus. Probable moderate chronic small vessel ischemic disease. No midline shift or mass effect. No abnormal extra-axial fluid collections. VASCULAR: Normal major intracranial vascular flow voids present at skull base. SKULL AND UPPER CERVICAL SPINE: No abnormal sellar expansion. No suspicious calvarial bone marrow signal. Craniocervical junction maintained. SINUSES/ORBITS: The mastoid air-cells and included paranasal sinuses are well-aerated.The included ocular globes and orbital contents are non-suspicious. Bilateral ocular lens implants. OTHER: None. IMPRESSION: 1. Limited 4 sequence MRI of the head, degraded by motion. No acute intracranial process. Electronically Signed   By: Elon Alas M.D.   On: 08/12/2017 15:41   US Carotid Bilateral (at [facility] And Ap Only)  Result Date: 08/13/2017 CLINICAL DATA:  Stroke EXAM: BILATERAL CAROTID DUPLEX ULTRASOUND TECHNIQUE: Pearline Cables scale imaging, color Doppler and duplex ultrasound were performed of bilateral carotid and vertebral arteries in the neck. COMPARISON:  None. FINDINGS: Criteria: Quantification of carotid  stenosis is based on velocity parameters that correlate the residual internal carotid diameter with NASCET-based stenosis levels, using the diameter of the distal internal carotid lumen as the denominator for stenosis measurement. The following velocity measurements were obtained: RIGHT ICA:  81 cm/sec CCA:  90 cm/sec SYSTOLIC ICA/CCA RATIO:  0.9 DIASTOLIC ICA/CCA RATIO: ECA:  169 cm/sec LEFT ICA:  55 cm/sec CCA:  063 cm/sec SYSTOLIC ICA/CCA RATIO:  0.5 DIASTOLIC ICA/CCA RATIO: ECA:  89 cm/sec RIGHT CAROTID ARTERY: Moderate calcified plaque in the bulb. Low resistance internal carotid Doppler pattern. RIGHT VERTEBRAL ARTERY:  Antegrade. LEFT CAROTID ARTERY: Mild smooth mixed plaque in the bulb. Low resistance internal carotid Doppler  pattern. LEFT VERTEBRAL ARTERY:  Antegrade. IMPRESSION: Less than 50% stenosis in the right and left internal carotid arteries. Electronically Signed   By: Marybelle Killings M.D.   On: 08/13/2017 09:58   Mr Jodene Nam Head/brain VV Cm  Result Date: 08/13/2017 CLINICAL DATA:  Aphasia.  Hyperglycemia. EXAM: MRI HEAD WITHOUT CONTRAST MRA HEAD WITHOUT CONTRAST TECHNIQUE: Multiplanar, multiecho pulse sequences of the brain and surrounding structures were obtained without intravenous contrast. Angiographic images of the head were obtained using MRA technique without contrast. COMPARISON:  Limited brain MRI 08/12/2017. Head CT 08/12/2017. Brain MRI/MRA 09/02/2016. FINDINGS: MRI HEAD FINDINGS Brain: No acute infarct, mass, midline shift, or extra-axial fluid collection is identified. Chronic microhemorrhages are again seen in the thalami, right lentiform nucleus, and pons suggesting chronic hypertension as the etiology. Patchy T2 hyperintensities in the cerebral white matter, thalami, and pons were more conspicuous on the 2018 MRI (less motion and higher magnetic field strength) but have likely not significantly changed and are nonspecific but compatible with moderate chronic small vessel ischemic  disease. Chronic lacunar infarcts are present in the left corona radiata, posterior left frontal subcortical white matter, and right cerebellum. Mild cerebral atrophy is within normal limits for age. Vascular: Major intracranial vascular flow voids are preserved. Skull and upper cervical spine: Unremarkable calvarial bone marrow signal. Chronic bone marrow heterogeneity in the visualized cervical spine with underlying disc degeneration. Sinuses/Orbits: Bilateral cataract extraction. Paranasal sinuses and mastoid air cells are clear. Other: None. MRA HEAD FINDINGS The study is moderately motion degraded. The visualized distal vertebral arteries are patent to the basilar with the left being dominant. There is an apparent moderate distal right V4 stenosis which is likely accentuated by motion artifact. PICAs, AICAs, and SCAs are grossly patent though not well evaluated due to motion. The basilar artery is patent without evidence of significant stenosis allowing for prominent motion artifact distally. PCAs are patent without evidence of flow limiting proximal stenosis. There is an improved appearance of the right P1-P2 junction suggesting that the moderate narrowing on the prior MRA was largely artifactual. The internal carotid arteries are patent from skull base to carotid termini with prominent motion artifact through the cavernous segments and no evidence of significant ICA stenosis elsewhere. The MCAs are patent without evidence of significant M1 stenosis or proximal branch occlusion although motion artifact limits branch vessel evaluation. There is rather extensive motion artifact through the ACAs with the right A1 segment being poorly visualized, particularly proximally. No sizable aneurysm is identified. IMPRESSION: 1. No acute intracranial abnormality. 2. Moderate chronic small vessel ischemic disease. 3. Motion degraded MRA. Poorly visualized right A1 segment for which a high-grade proximal stenosis is not  excluded. 4. No evidence of high-grade intracranial ICA or M1 stenosis. 5. Moderate distal right V4 stenosis versus artifact. Electronically Signed   By: Logan Bores M.D.   On: 08/13/2017 11:25     CBC Recent Labs  Lab 08/12/17 1131  WBC 11.6*  HGB 12.4  HCT 38.9  PLT 247  MCV 85.5  MCH 27.4  MCHC 32.0  RDW 16.0*  LYMPHSABS 1.6  MONOABS 0.5  EOSABS 0.0  BASOSABS 0.0    Chemistries  Recent Labs  Lab 08/12/17 1131  NA 131*  K 4.3  CL 95*  CO2 28  GLUCOSE 532*  BUN 23*  CREATININE 1.35*  CALCIUM 8.8*  AST 31  ALT 16  ALKPHOS 85  BILITOT 1.3*   ------------------------------------------------------------------------------------------------------------------ estimated creatinine clearance is 40.7 mL/min (A) (by C-G formula based on  SCr of 1.35 mg/dL (H)). ------------------------------------------------------------------------------------------------------------------ Recent Labs    08/13/17 0439  HGBA1C 13.1*   ------------------------------------------------------------------------------------------------------------------ Recent Labs    08/13/17 0439  CHOL 147  HDL 46  LDLCALC 83  TRIG 91  CHOLHDL 3.2   ------------------------------------------------------------------------------------------------------------------ No results for input(s): TSH, T4TOTAL, T3FREE, THYROIDAB in the last 72 hours.  Invalid input(s): FREET3 ------------------------------------------------------------------------------------------------------------------ No results for input(s): VITAMINB12, FOLATE, FERRITIN, TIBC, IRON, RETICCTPCT in the last 72 hours.  Coagulation profile Recent Labs  Lab 08/12/17 1131  INR 1.01    No results for input(s): DDIMER in the last 72 hours.  Cardiac Enzymes Recent Labs  Lab 08/12/17 1131  TROPONINI 0.07*   ------------------------------------------------------------------------------------------------------------------ Invalid  input(s): POCBNP    Assessment & Plan  Patient is a 77 year old admitted with aphasia   #1 aphasia suspect this is related to seizure MRI of the brain is negative for stroke Continue Keppra Neuro eval pending  #2 diabetes type 2 with very poor control Patient will need insulin on discharge Continue Lovenox and pre-meal insulin Hold oral regimen  #3 Acute renal failure with dehydration.    Repeat BMP in the morning  #4 hypertension, continue HCTZ, Hyzaar  #5 hyperlipidemia continue Lipitor           Code Status Orders  (From admission, onward)        Start     Ordered   08/12/17 1728  Do not attempt resuscitation (DNR)  Continuous    Question Answer Comment  In the event of cardiac or respiratory ARREST Do not call a "code blue"   In the event of cardiac or respiratory ARREST Do not perform Intubation, CPR, defibrillation or ACLS   In the event of cardiac or respiratory ARREST Use medication by any route, position, wound care, and other measures to relive pain and suffering. May use oxygen, suction and manual treatment of airway obstruction as needed for comfort.      08/12/17 1727    Code Status History    Date Active Date Inactive Code Status Order ID Comments User Context   09/02/2016 1536 09/05/2016 2126 Full Code [id number]  Radene Gunning, NP ED   02/29/2016 1533 03/02/2016 2108 Full Code [id number]  Samella Parr, NP Inpatient   09/22/2014 1406 09/25/2014 1717 Full Code [id number]  [name], MD Inpatient    Advance Directive Documentation     Most Recent Value  Type of Advance Directive  Living will  Pre-existing out of facility DNR order (yellow form or pink MOST form)  -  "MOST" Form in Place?  -           Consults  59min  DVT Prophylaxis  Lovenox  Lab Results  Component Value Date   PLT 247 08/12/2017     Time Spent in minutes    Greater than 50% of time spent in care coordination and counseling patient regarding the condition and  plan of care.   Dustin Flock M.D on 08/13/2017 at 2:06 PM  Between 7am to 6pm - Pager - [phone]  After 6pm go to [email or web address] - Proofreader  [employer]   Office  [phone]

## 2017-08-13 NOTE — NC FL2 (Signed)
[place] LEVEL OF CARE SCREENING TOOL     IDENTIFICATION  Patient Name: Kristen [name] Birthdate: 1941-03-30 Sex: female Admission Date (Current Location): 08/12/2017  [place] and [place] Number:  Engineering geologist and Address:  [facility], [address], [place], [place] [address]      Provider Number: [id number]  Attending Physician Name and Address:  Dustin Flock, MD  Relative Name and Phone Number:       Current Level of Care: Hospital Recommended Level of Care: [place] Prior Approval Number:    Date Approved/Denied:   PASRR Number: [id number])  Discharge Plan: SNF    Current Diagnoses: Patient Active Problem List   Diagnosis Date Noted  . Acute CVA (cerebrovascular accident) ([place]) 08/12/2017  . Thyroid function test abnormal 03/12/2017  . Disturbance of skin sensation 09/21/2016  . Stroke (cerebrum) ([place]) 09/03/2016  . Hemiparesis of right dominant side ([place]) 09/02/2016  . Intertrigo 08/16/2016  . Vitamin D deficiency 08/08/2016  . Gout 03/24/2016  . Syncope 02/29/2016  . Acute kidney injury ([place]) 02/29/2016  . Concussion 02/29/2016  . Arthritis of left wrist 02/07/2016  . Hemoptysis 12/31/2015  . Thoracic aortic atherosclerosis ([place]) 12/24/2015  . History of CVA (cerebrovascular accident) 11/03/2015  . Abdominal aortic atherosclerosis ([place])   . Anemia 06/18/2015  . Sensorineural hearing loss (SNHL) of both ears 03/29/2015  . Axillary mass 03/10/2015  . Pain of right middle finger 12/22/2014  . Memory deficit 12/04/2014  . Primary osteoarthritis of left knee 01/27/2014  . Advanced care planning/counseling discussion 12/11/2013  . Medicare annual wellness visit, subsequent 12/11/2013  . Health maintenance examination 12/11/2013  . CAD (coronary artery disease)/coronary calcifications   . Chest pain 07/17/2013  . Benign paroxysmal positional vertigo 05/26/2013  . HLD (hyperlipidemia)    . Obesity, Class II, BMI 35-39.9, with comorbidity ([place])   . History of pulmonary embolism   . Osteoarthritis   . Diabetes type 2, uncontrolled ([place])   . History of DVT (deep vein thrombosis) 12/21/2010  . Hypertension, uncontrolled 05/13/2009    Orientation RESPIRATION BLADDER Height & Weight     Self, Time, Situation, Place  Normal Incontinent Weight: 220 lb (99.8 kg) Height:  5\' 4"  (162.6 cm)  BEHAVIORAL SYMPTOMS/MOOD NEUROLOGICAL BOWEL NUTRITION STATUS      Continent Diet(NPO to be advanced. )  AMBULATORY STATUS COMMUNICATION OF NEEDS Skin   Extensive Assist Verbally Normal                       Personal Care Assistance Level of Assistance  Bathing, Feeding, Dressing Bathing Assistance: Limited assistance Feeding assistance: Independent Dressing Assistance: Limited assistance     Functional Limitations Info  Sight, Hearing, Speech Sight Info: Adequate Hearing Info: Adequate Speech Info: Adequate    SPECIAL CARE FACTORS FREQUENCY  PT (By licensed PT), OT (By licensed OT)     PT Frequency: (5) OT Frequency: (5)            Contractures      Additional Factors Info  Code Status, Allergies Code Status Info: (DNR ) Allergies Info: (Acetaminophen, Aleve Naproxen Sodium, Doxycycline, Metformin And Related, Penicillins)           Current Medications (08/13/2017):  This is the current hospital active medication list Current Facility-Administered Medications  Medication Dose Route Frequency Provider Last Rate Last Dose  . 0.9 %  sodium chloride infusion   Intravenous Continuous Demetrios Loll, MD 75 mL/hr at  08/12/17 2351    . aspirin suppository 300 mg  300 mg Rectal Daily Demetrios Loll, MD   300 mg at 08/13/17 1106   Or  . aspirin tablet 325 mg  325 mg Oral Daily Demetrios Loll, MD      . atorvastatin (LIPITOR) tablet 40 mg  40 mg Oral q1800 Demetrios Loll, MD      . bisacodyl (DULCOLAX) suppository 10 mg  10 mg Rectal Daily PRN Demetrios Loll, MD      . clopidogrel  (PLAVIX) tablet 75 mg  75 mg Oral Daily Demetrios Loll, MD      . enoxaparin (LOVENOX) injection 40 mg  40 mg Subcutaneous Q24H Demetrios Loll, MD      . hydrochlorothiazide (MICROZIDE) capsule 12.5 mg  12.5 mg Oral Daily Demetrios Loll, MD      . insulin aspart (novoLOG) injection 0-9 Units  0-9 Units Subcutaneous TID WC Dustin Flock, MD   3 Units at 08/13/17 1302  . insulin glargine (LANTUS) injection 10 Units  10 Units Subcutaneous QHS Demetrios Loll, MD   10 Units at 08/12/17 2207  . levETIRAcetam (KEPPRA) 500 mg in sodium chloride 0.9 % 100 mL injection   Intravenous Q12H Demetrios Loll, MD   Stopped at 08/13/17 1119  . living well with diabetes book MISC   Does not apply Once Dustin Flock, MD      . LORazepam (ATIVAN) injection 1 mg  1 mg Intravenous Q1H PRN Demetrios Loll, MD   1 mg at 08/13/17 0946  . losartan (COZAAR) tablet 100 mg  100 mg Oral Daily Demetrios Loll, MD      . nitroGLYCERIN (NITROSTAT) SL tablet 0.4 mg  0.4 mg Sublingual Q5 min PRN Demetrios Loll, MD      . senna-docusate (Senokot-S) tablet 1 tablet  1 tablet Oral QHS PRN Demetrios Loll, MD         Discharge Medications: Please see discharge summary for a list of discharge medications.  Relevant Imaging Results:  Relevant Lab Results:   Additional Information (SSN: [id number])  Saige Canton, Veronia Beets, LCSW

## 2017-08-13 NOTE — Progress Notes (Signed)
OT Cancellation Note  Patient Details Name: Kristen [name] MRN: [id number] DOB: 07/11/1940   Cancelled Treatment:    Reason Eval/Treat Not Completed: Fatigue/lethargy limiting ability to participate. On 2nd attempt, pt back in room with family present. Lethargic, family noting pt is very fatigued after multiple tests and assessments today and is unable to participate well in OT evaluation this afternoon. Agreeable to OT evaluation attempt next date.   Jeni Salles, MPH, MS, OTR/L ascom [phone] 08/13/17, 4:37 PM

## 2017-08-13 NOTE — Evaluation (Addendum)
Clinical/Bedside Swallow Evaluation Patient Details  Name: Kristen [name] MRN: [id number] Date of Birth: Apr 21, 1941  Today's Date: 08/13/2017 Time: SLP Start Time (ACUTE ONLY): 1300 SLP Stop Time (ACUTE ONLY): 1400 SLP Time Calculation (min) (ACUTE ONLY): 60 min  Past Medical History:  Past Medical History:  Diagnosis Date  . Abdominal aortic atherosclerosis ([place]) by CT 02/2014  . CAD (coronary artery disease)    by CT, per pt h/o MI  . Diabetes type 2, uncontrolled ([place])   . Frequent headaches   . GERD (gastroesophageal reflux disease)   . Gout   . History of pulmonary embolism 2012  . HLD (hyperlipidemia)   . HTN (hypertension)   . 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 Morbid obesity ([place])   . Myocardial infarction [facility]) 2012   per pt. report, states she was treated with medicine, here at [facility]    . Osteoarthritis    knees  . Primary localized osteoarthritis of left knee 09/22/2014  . Sleep apnea 2011   study done in [place], states that since she lost weight she doesn't use the CPAP any longer & she doesn't have a problem with sleep apnea  . Stroke [facility])    still has balance problem on occas. , uses cane but that's mainly for the left knee pain  . Syncope 03/01/2016  . Thoracic aortic atherosclerosis ([place]) 12/2015   by CXR  . Vertigo    hx. benign postitional postural   Past Surgical History:  Past Surgical History:  Procedure Laterality Date  . CATARACT EXTRACTION Bilateral 2013  . EYE SURGERY     /w IOL  . FOOT SURGERY Right   . PARTIAL HYSTERECTOMY     for fibroids, ovaries remain  . TONSILLECTOMY    . TOTAL KNEE ARTHROPLASTY Right 1990s  . TOTAL KNEE ARTHROPLASTY Left 09/22/2014   [name], MD  . TUBAL LIGATION     HPI:  Pt is a 77 y.o. female w/ multiple medical dx including GERD, obesity, prior CVA, DM, HTN, syncope, MI who presents via EMS with altered mental  status.  Patient is clearly confused quite fidgety grabbing at her gown.  Will intermittently respond with one-word responses.  She is moving all extremities.  EMS reports the patient reportedly had a fall last night.  Last seen normal was sometime last afternoon or evening according to EMS.  No additional history is provided by patient, family or EMS.  Did report that her blood sugar was greater than 500; does have a history of type 2 diabetes reportedly poorly controlled.  Pt has had 3 seizures in the past hour and a half in the ED and a fourth one when admitted to the floor. MD notified. Patient started on keppra and PRN ativan. Patient has had several episodes where she is almost in a catatonic state with babbling.  Son stated pt has been eating well at home w/ no reported overt s/s of aspiration.  Per Neurology note, Aphasia likely a post-ictal phenomenon.  Patient started on Keppra.  MRI of the brain reviewed and shows no evidence of acute changes.  Seizure likely a consequence of past stroke.    Assessment / Plan / Recommendation Clinical Impression  Pt appears to present w/ min oropharyngeal phase dysphagia - suspect most impact related to pt's declined Cognitive status at this time post seizure activity, medications for such. Pt is currently min  drowsy as well. Per Neurology note, Aphasia likely a post-ictal phenomenon. Patient started on Keppra. MRI of the brain reviewed and shows no evidence of acute changes. Seizures likely a consequence of past stroke. Pt was able to be awakened for po trials; monitored by both SLP and Son. Pt consumed ice chips, thin liquids via Cup (often feeding herself) and purees (assisted). Pt consumed trials w/ min decreased overall awareness and min impulsiveness when drinking liquid via Cup resulting in overt coughing x1. When pt slowed down and took smaller sips, no further coughing was noted. Pt appeared to adequately manage puree trials orally and cleared given min extra  time w/ lingual sweeping. Pt is not wearing her dentures at this time - recommended family help pt "practice" wearing them tonight after dinner for tomorrow's trials of upgraded textured foods w/ SLP.  Son agreed to work w/ pt on this. Pt will need support w/ feeding d/t overall weakness at this time. Recommend a Puree diet w/ thin liquids; aspiration precautions; Pills in Puree - Whole as able but Crushed as needed. Pt will need monitoring at all meals d/t Cognitive status currently. Education given to Son present. NSG updated.  SLP Visit Diagnosis: Dysphagia, oropharyngeal phase (R13.12)    Aspiration Risk  Mild aspiration risk(but reduced following precautions)    Diet Recommendation  Dysphagia level 1 (puree) w/ Thin liquids; aspiration precautions; feeding support at meals - monitoring at meals.   Medication Administration: Crushed with puree(but Whole in Puree if able to tolerate safely)    Other  Recommendations Recommended Consults: (Dietician f/u) Oral Care Recommendations: Oral care BID;Staff/trained caregiver to provide oral care Other Recommendations: (n/a)   Follow up Recommendations (TBD for swallowing)      Frequency and Duration min 3x week  2 weeks       Prognosis Prognosis for Safe Diet Advancement: Good Barriers to Reach Goals: Cognitive deficits      Swallow Study   General Date of Onset: 08/12/17 HPI: Pt is a 77 y.o. female w/ multiple medical dx including GERD, obesity, prior CVA, DM, HTN, syncope, MI who presents via EMS with altered mental status.  Patient is clearly confused quite fidgety grabbing at her gown.  Will intermittently respond with one-word responses.  She is moving all extremities.  EMS reports the patient reportedly had a fall last night.  Last seen normal was sometime last afternoon or evening according to EMS.  No additional history is provided by patient, family or EMS.  Did report that her blood sugar was greater than 500; does have a history of  type 2 diabetes reportedly poorly controlled.  Pt has had 3 seizures in the past hour and a half in the ED and a fourth one when admitted to the floor. MD notified. Patient started on keppra and PRN ativan. Patient has had several episodes where she is almost in a catatonic state with babbling.  Son stated pt has been eating well at home w/ no reported overt s/s of aspiration.  Per Neurology note, Aphasia likely a post-ictal phenomenon.  Patient started on Keppra.  MRI of the brain reviewed and shows no evidence of acute changes.  Seizure likely a consequence of past stroke.  Type of Study: Bedside Swallow Evaluation Previous Swallow Assessment: none reported Diet Prior to this Study: NPO(since admission) Temperature Spikes Noted: (wbc 11.6;  temp 99.5) Respiratory Status: Room air History of Recent Intubation: No Behavior/Cognition: Cooperative;Pleasant mood;Confused;Distractible;Requires cueing(awake but intermittently drowsy) Oral Cavity Assessment: Dry(sticky) Oral Care  Completed by SLP: Recent completion by staff Oral Cavity - Dentition: Edentulous(not wearing her dentures at this time) Vision: Functional for self-feeding Self-Feeding Abilities: Able to feed self;Needs assist;Needs set up(support needed w/ all intake(total assist)) Patient Positioning: Upright in bed Baseline Vocal Quality: Low vocal intensity(few words) Volitional Cough: Cognitively unable to elicit(strong cough when involuntary) Volitional Swallow: Unable to elicit    Oral/Motor/Sensory Function Overall Oral Motor/Sensory Function: Within functional limits(w/ bolus management)   Ice Chips Ice chips: Within functional limits(grossly) Presentation: Spoon(fed; 4 trials)   Thin Liquid Thin Liquid: Impaired Presentation: Cup;Self Fed(monitored; ~4 ozs total) Oral Phase Impairments: Poor awareness of bolus(min impulsive) Oral Phase Functional Implications: (quick drinking) Pharyngeal  Phase Impairments: Cough - Immediate(x1  w/ multiple sips quickly) Other Comments: improved presentation w/ slower, single sips    Nectar Thick Nectar Thick Liquid: Not tested   Honey Thick Honey Thick Liquid: Not tested   Puree Puree: Within functional limits Presentation: Spoon(fed; 10 trials ) Other Comments: adequate bolus management and oral clearing w/ all trials given   Solid   GO   Solid: Not tested Other Comments: not wearing her dentures today         Orinda Kenner, MS, CCC-SLP Watson,Katherine 08/13/2017,3:05 PM

## 2017-08-13 NOTE — Evaluation (Deleted)
Physical Therapy Evaluation Patient Details Name: Kristen [name] MRN: [id number] DOB: 1940-10-20 Today's Date: 08/13/2017   History of Present Illness  Patient is a 77 year old female admitted for a CVA following c/o aphasia.  PMH includes vertigo, stroke, OA, DM, PE, Htn, MI, gout and frequent HA's.  Clinical Impression  Pt is a 77 year old female who lives in a one story home with her daughter.  She is dependent on a WC and RW at baseline and able to enter her home via 3 steps in the front.  Pt in bed and reporting no pain upon PT arrival.  She presents as generally weak in UE/LE.  She required 2 person assist for bed mobility and experienced pain increase in R elbow with hand held assists.  Pt able to perform partial bridge to reposition in bed but requires mod-max A for rolling, sup to sit and sit to supine.  Pt able to balance at EOB for short periods of time before losing balance to L side.  Performed STS CGA with use of RW and VC's for upright posture.  Performed standing pivot transfer with 2 person assist for stability, management of RW and VC's for sequencing and to motivate pt to keep moving.  Pt reported feeling light-headed following transfer and vitals tested WNL.  Pt will continue to benefit from skilled PT with focus on strength, functional mobility, tolerance to activity, safe use of AD and tolerance to activity.    Follow Up Recommendations SNF    Equipment Recommendations  None recommended by PT    Recommendations for Other Services       Precautions / Restrictions Precautions Precautions: Fall Restrictions Weight Bearing Restrictions: No      Mobility  Bed Mobility Overal bed mobility: Needs Assistance Bed Mobility: Supine to Sit;Sit to Supine;Rolling Rolling: Mod assist   Supine to sit: Max assist;+2 for physical assistance Sit to supine: Max assist   General bed mobility comments: Required assistance from PT and daughter to get to EOB.  Pt able to sit at EOB  but continues to lean to L side.  Transfers Overall transfer level: Needs assistance Equipment used: Rolling walker (2 wheeled) Transfers: Sit to/from [facility] Sit to Stand: +2 physical assistance;Min guard Stand pivot transfers: Min assist       General transfer comment: Pt able to slowly initiate and complete STS with RW, requires VC's for upright posture.  Required VC's for sequencing and management of RW during pivot transfer.  Pt maintained low foot clearance and stopped throughout transfer until encouraged to continue.  PT rotated RW in correct direction for pt.  Ambulation/Gait Ambulation/Gait assistance: (Did not perform.  Pt states that she does not think that she can walk at this time.)              Stairs            Wheelchair Mobility    Modified Rankin (Stroke Patients Only)       Balance Overall balance assessment: Needs assistance Sitting-balance support: Feet supported;Bilateral upper extremity supported     Postural control: Left lateral lean Standing balance support: Bilateral upper extremity supported   Standing balance comment: Able to stand for short period of time when moving LE's away from bed.                             Pertinent Vitals/Pain Pain Assessment: Faces Faces Pain Scale: Hurts  a little bit Pain Location: bilateral upper arms during BP reading, R elbow during bed mobility Pain Intervention(s): Limited activity within patient's tolerance    Home Living Family/patient expects to be discharged to:: Private residence Living Arrangements: Children(Daughter) Available Help at Discharge: Family;Available 24 hours/day Type of Home: House Home Access: Stairs to enter Entrance Stairs-Rails: Can reach both Entrance Stairs-Number of Steps: 3 Home Layout: One level Home Equipment: Walker - 2 wheels;Wheelchair - manual      Prior Function Level of Independence: Needs assistance   Gait / Transfers  Assistance Needed: Pt dependent on her daughter to help stair negotiation and mobility with WC and RW.           Hand Dominance        Extremity/Trunk Assessment   Upper Extremity Assessment Upper Extremity Assessment: Generalized weakness    Lower Extremity Assessment Lower Extremity Assessment: Generalized weakness    Cervical / Trunk Assessment Cervical / Trunk Assessment: Kyphotic  Communication   Communication: No difficulties  Cognition Arousal/Alertness: Awake/alert Behavior During Therapy: WFL for tasks assessed/performed Overall Cognitive Status: Within Functional Limits for tasks assessed                                 General Comments: Pt alert to name and situation.      General Comments      Exercises     Assessment/Plan    PT Assessment Patient needs continued PT services  PT Problem List Decreased strength;Decreased mobility;Decreased activity tolerance;Decreased balance;Decreased knowledge of use of DME;Pain       PT Treatment Interventions DME instruction;Therapeutic activities;Gait training;Therapeutic exercise;Stair training;Balance training;Cognitive remediation;Functional mobility training;Neuromuscular re-education;Patient/family education    PT Goals (Current goals can be found in the Care Plan section)  Acute Rehab PT Goals Patient Stated Goal: To eventually return home. PT Goal Formulation: With patient/family Time For Goal Achievement: 08/27/17 Potential to Achieve Goals: Good    Frequency 7X/week   Barriers to discharge        Co-evaluation               AM-PAC PT "6 Clicks" Daily Activity  Outcome Measure Difficulty turning over in bed (including adjusting bedclothes, sheets and blankets)?: A Lot Difficulty moving from lying on back to sitting on the side of the bed? : A Lot Difficulty sitting down on and standing up from a chair with arms (e.g., wheelchair, bedside commode, etc,.)?: A Lot Help needed  moving to and from a bed to chair (including a wheelchair)?: A Lot Help needed walking in hospital room?: A Lot Help needed climbing 3-5 steps with a railing? : Total 6 Click Score: 11    End of Session Equipment Utilized During Treatment: Gait belt;Oxygen Activity Tolerance: Patient limited by fatigue;Patient limited by pain Patient left: in chair;with call bell/phone within reach;with chair alarm set;with nursing/sitter in room;with family/visitor present Nurse Communication: Mobility status PT Visit Diagnosis: Unsteadiness on feet (R26.81);Muscle weakness (generalized) (M62.81);Pain Pain - Right/Left: Right Pain - part of body: Arm    Time: 1130-1200 PT Time Calculation (min) (ACUTE ONLY): 30 min   Charges:   PT Evaluation $PT Eval Moderate Complexity: 1 Mod PT Treatments $Therapeutic Activity: 8-22 mins   PT G Codes:   PT G-Codes **NOT FOR INPATIENT CLASS** Functional Assessment Tool Used: AM-PAC 6 Clicks Basic Mobility    Roxanne Gates, PT, DPT   Roxanne Gates 08/13/2017, 1:22 PM

## 2017-08-13 NOTE — Clinical Social Work Placement (Signed)
   CLINICAL SOCIAL WORK PLACEMENT  NOTE  Date:  08/13/2017  Patient Details  Name: Kristen [name] MRN: [id number] Date of Birth: 1941/01/18  Clinical Social Work is seeking post-discharge placement for this patient at the [place] level of care (*CSW will initial, date and re-position this form in  chart as items are completed):  Yes   Patient/family provided with [facility] Work Department's list of facilities offering this level of care within the geographic area requested by the patient (or if unable, by the patient's family).  Yes   Patient/family informed of their freedom to choose among providers that offer the needed level of care, that participate in Medicare, Medicaid or managed care program needed by the patient, have an available bed and are willing to accept the patient.  Yes   Patient/family informed of [facility]'s ownership interest in [facility] and [facility], as well as of the fact that they are under no obligation to receive care at these facilities.  PASRR submitted to EDS on       PASRR number received on       Existing PASRR number confirmed on 08/13/17     FL2 transmitted to all facilities in geographic area requested by pt/family on 08/13/17     FL2 transmitted to all facilities within larger geographic area on       Patient informed that his/her managed care company has contracts with or will negotiate with certain facilities, including the following:            Patient/family informed of bed offers received.  Patient chooses bed at       Physician recommends and patient chooses bed at      Patient to be transferred to   on  .  Patient to be transferred to facility by       Patient family notified on   of transfer.  Name of family member notified:        PHYSICIAN       Additional Comment:    _______________________________________________ Alexxus Sobh, Veronia Beets, LCSW 08/13/2017, 6:40 PM

## 2017-08-13 NOTE — Consult Note (Signed)
   [facility] Inpatient Consult   08/13/2017  Kristen [name] 1940-07-02 [id number]    [facility] Care Management referral received from inpatient DM Coordinator.  Spoke with inpatient RNCM prior to calling into patient's room to discuss [facility] Management program.  Attempted to speak with [name] via phone. Spoke with son Kristen [name] instead. [name] was asleep.  Explained [facility] Management program and patient's son Kristen [name] is agreeable. Kristen [name] lives in [place]. His contact number is [phone].  Patient's son Kristen [name] lives locally. Kristen [name] telephone number is [phone].  [name] lives alone. Kristen [name] (son) states his brother, [name] usually takes her to MD appointments.  Denies any concerns with obtaining medications.  Discussed [facility] Care Management follow up for DM management and education.   Patient's Primary Care Provider office [name] at [facility]) is listed as doing transition of care call post discharge.  Confirmed [name] telephone number as [phone].  Notification sent to inpatient RNCM to make aware [facility] will follow.   Will make referral for [place] for DM management and education. Patient's Hgb A1c is 13.1.   Marthenia Rolling, MSN-Ed, RN,BSN [facility] Liaison [phone]

## 2017-08-13 NOTE — Progress Notes (Addendum)
Inpatient Diabetes Program Recommendations  AACE/ADA: New Consensus Statement on Inpatient Glycemic Control (2015)  Target Ranges:  Prepandial:   less than 140 mg/dL      Peak postprandial:   less than 180 mg/dL (1-2 hours)      Critically ill patients:  140 - 180 mg/dL  Results for Kristen [name], Kristen [name] (MRN [id number]) as of 08/13/2017 08:38  Ref. Range 08/12/2017 13:10 08/12/2017 21:28 08/13/2017 08:26  Glucose-Capillary Latest Ref Range: 65 - 99 mg/dL 436 (H) 227 (H) 285 (H)   Results for Kristen [name], Kristen [name] (MRN [id number]) as of 08/13/2017 08:38  Ref. Range 03/12/2017 09:42 08/13/2017 04:39  Hemoglobin A1C Latest Ref Range: 4.8 - 5.6 % 6.6 (H) 13.1 (H)   Review of Glycemic Control  Diabetes history: DM2 Outpatient Diabetes medications: Amaryl 4 mg QAM, Actos 30 mg daily, Duetact 30-2 mg QAM (combination of Amaryl and Actos); per home med list pt is not taking Actos or Duetact Current orders for Inpatient glycemic control: Lantus 10 units QHS  Inpatient Diabetes Program Recommendations:  Insulin - Basal: Please consider increasing Lantus to 15 units daily. Correction (SSI): Please consider ordering CBGs ACHS with Novolog 0-15 units TID with meals and Novolog 0-5 units QHS. HgbA1C: A1C 13.1% on 08/13/17 indicating an average glucose of 329 mg/dl over the past 2-3 months. Patient may need insulin as an outpatient for DM control.  Addendum 08/13/17@11 :00-Went by to talk with patient regarding DM control but patient is somnolent and RN reports that patient received Ativan prior to MRI this morning. Patient's son at bedside and states that patient lives alone. Patient's son mentioned that patient's initial glucose was over 500 mg/dl and stated that family will likely need to get more involved and help her at home. Patient's son (currently in the room) is from [place] and states that he has several brothers that will be helping out. Will attempt to see patient again tomorrow to see if she is more alert and  able to engage in discussion about DM control.  Thanks, Barnie Alderman, RN, MSN, CDE Diabetes Coordinator Inpatient Diabetes Program [phone] (Team Pager from 8am to 5pm)

## 2017-08-13 NOTE — Progress Notes (Signed)
PT Cancellation Note  Patient Details Name: Kristen [name] MRN: [id number] DOB: 05/29/1940   Cancelled Treatment:    Reason Eval/Treat Not Completed: Medical issues which prohibited therapy.  Order received.  Chart reviewed.  Per Lab Values chart, pt's glucose is 532 and pt has recently experienced multiple seizures.  PT will re-attempt later if pt is medically appropriate.   Roxanne Gates, PT, DPT 08/13/2017, 7:56 AM

## 2017-08-13 NOTE — Progress Notes (Signed)
SLP Cancellation Note  Patient Details Name: Kristen [name] MRN: [id number] DOB: [birth year]   Cancelled treatment:       Reason Eval/Treat Not Completed: Fatigue/lethargy limiting ability to participate(pt returned to room post test; lethargic. ST will f/u in PM.)    Orinda Kenner, MS, CCC-SLP Vidyuth Belsito 08/13/2017, 11:52 AM

## 2017-08-13 NOTE — Consult Note (Addendum)
Reason for Consult:Aphasia Referring Physician: Posey Pronto  CC: Aphasia  HPI: Kristen [name] is an 77 y.o. female with a history of stroke.  Currently sedated and unable to provide history.  All history obtained from son.  Patient had a stroke in May of last year.  Symptoms were right sided.  Patient recovered well and has been living alone.  On the day of admission her son found her unable to talk. Later noted that she has facial jerking.  Patient was brought to the ED and admitted. Had multiple further episodes of facial jerking and has since been started on Keppra.  Per son has improved since that time.    Past Medical History:  Diagnosis Date  . Abdominal aortic atherosclerosis ([place]) by CT 02/2014  . CAD (coronary artery disease)    by CT, per pt h/o MI  . Diabetes type 2, uncontrolled ([place])   . Frequent headaches   . GERD (gastroesophageal reflux disease)   . Gout   . History of pulmonary embolism 2012  . HLD (hyperlipidemia)   . HTN (hypertension)   . 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 Morbid obesity ([place])   . Myocardial infarction [facility]) 2012   per pt. report, states she was treated with medicine, here at [facility]    . Osteoarthritis    knees  . Primary localized osteoarthritis of left knee 09/22/2014  . Sleep apnea 2011   study done in [place], states that since she lost weight she doesn't use the CPAP any longer & she doesn't have a problem with sleep apnea  . Stroke [facility])    still has balance problem on occas. , uses cane but that's mainly for the left knee pain  . Syncope 03/01/2016  . Thoracic aortic atherosclerosis ([place]) 12/2015   by CXR  . Vertigo    hx. benign postitional postural    Past Surgical History:  Procedure Laterality Date  . CATARACT EXTRACTION Bilateral 2013  . EYE SURGERY     /w IOL  . FOOT SURGERY Right   . PARTIAL HYSTERECTOMY     for fibroids, ovaries remain   . TONSILLECTOMY    . TOTAL KNEE ARTHROPLASTY Right 1990s  . TOTAL KNEE ARTHROPLASTY Left 09/22/2014   [name], MD  . TUBAL LIGATION      Family History  Problem Relation Age of Onset  . Cancer Mother        bone  . Diabetes Father   . Hypertension Father   . Cancer Son 31       lung  . Congenital heart disease Son 75  . Stroke Brother     Social History:  reports that she has never smoked. She has never used smokeless tobacco. She reports that she drinks about 1.2 oz of alcohol per week. She reports that she does not use drugs.  Allergies  Allergen Reactions  . Acetaminophen Other (See Comments)    "Causes me to spit up blood"  . Aleve [Naproxen Sodium] Other (See Comments)    Spits up blood  . Doxycycline Other (See Comments)    Malaise, GI upset, "felt drunk" and very ill  . Metformin And Related Other (See Comments)    Chills, dizziness  . Penicillins Rash    Has patient had a PCN reaction causing immediate rash, facial/tongue/throat swelling, SOB or lightheadedness with hypotension: Yes Has patient had a PCN reaction  causing severe rash involving mucus membranes or skin necrosis: No Has patient had a PCN reaction that required hospitalization No Has patient had a PCN reaction occurring within the last 10 years: No If all of the above answers are "NO", then may proceed with Cephalosporin use.     Medications:  I have reviewed the patient's current medications. Prior to Admission:  Medications Prior to Admission  Medication Sig Dispense Refill Last Dose  . Blood Glucose Monitoring Suppl (ONE TOUCH ULTRA SYSTEM KIT) w/Device KIT 1 kit by Does not apply route once. 1 each 0 Taking  . Cholecalciferol (VITAMIN D) 2000 units CAPS Take 1 capsule by mouth daily.   Unknown at Unknown  . clopidogrel (PLAVIX) 75 MG tablet Take 1 tablet (75 mg total) by mouth daily. 90 tablet 3 08/11/2017 at Unknown time  . clotrimazole (LOTRIMIN AF) 1 % cream Apply 1 application  topically 2 (two) times daily. Rash under breast 45 g 1 Past Month at Unknown time  . Colchicine (MITIGARE) 0.6 MG CAPS Take 1 tablet by mouth daily as needed. 30 capsule 3 PRN at PRN  . glimepiride (AMARYL) 4 MG tablet Take 1 tablet (4 mg total) by mouth daily with breakfast. 90 tablet 3 08/11/2017 at Unknown time  . glucose blood test strip Ck blood sugar twice a day and as directed. 100 each 3 Taking  . losartan-hydrochlorothiazide (HYZAAR) 100-12.5 MG tablet Take 1 tablet by mouth daily. 90 tablet 3 08/11/2017 at Unknown time  . nitroGLYCERIN (NITROSTAT) 0.4 MG SL tablet Place 0.4 mg under the tongue every 5 (five) minutes as needed for chest pain.   PRN at PRN  . ONETOUCH DELICA LANCETS 68G MISC Check blood sugar twice a day and as directed. 100 each 3 Taking  . potassium chloride (K-DUR) 10 MEQ tablet Take 1 tablet (10 mEq total) by mouth daily. (Patient taking differently: Take 10 mEq by mouth every Monday, Wednesday, and Friday. ) 90 tablet 3 08/10/2017 at Unknown  . simvastatin (ZOCOR) 20 MG tablet Take 20 mg by mouth daily.    08/11/2017 at Unknown  . atorvastatin (LIPITOR) 20 MG tablet Take 1 tablet (20 mg total) by mouth daily at 6 PM. (Patient not taking: Reported on 08/12/2017) 90 tablet 3 Not Taking at Unknown time  . pioglitazone (ACTOS) 30 MG tablet Take 1 tablet (30 mg total) by mouth daily. (Patient not taking: Reported on 08/12/2017) 90 tablet 3 Not Taking at Unknown time  . pioglitazone-glimepiride (DUETACT) 30-2 MG tablet Take 1 tablet by mouth daily with breakfast. (Patient not taking: Reported on 08/12/2017) 90 tablet 1 Not Taking at Unknown time   Scheduled: . aspirin  300 mg Rectal Daily   Or  . aspirin  325 mg Oral Daily  . atorvastatin  40 mg Oral q1800  . clopidogrel  75 mg Oral Daily  . enoxaparin (LOVENOX) injection  40 mg Subcutaneous Q24H  . hydrochlorothiazide  12.5 mg Oral Daily  . insulin aspart  0-9 Units Subcutaneous TID WC  . insulin glargine  15 Units  Subcutaneous QHS  . living well with diabetes book   Does not apply Once  . losartan  100 mg Oral Daily    ROS: Unable to provide due to lethargy and aphasia  Physical Examination: Blood pressure (!) 175/77, pulse 61, temperature 98.5 F (36.9 C), temperature source Oral, resp. rate 16, height 5' 4" (1.626 m), weight 99.8 kg (220 lb), SpO2 100 %.  HEENT-  Normocephalic, no lesions, without obvious  abnormality.  Normal external eye and conjunctiva.  Normal TM's bilaterally.  Normal auditory canals and external ears. Normal external nose, mucus membranes and septum.  Normal pharynx. Cardiovascular- S1, S2 normal, pulses palpable throughout   Lungs- chest clear, no wheezing, rales, normal symmetric air entry Abdomen- soft, non-tender; bowel sounds normal; no masses,  no organomegaly Extremities- no edema Lymph-no adenopathy palpable Musculoskeletal-no joint tenderness, deformity or swelling Skin-warm and dry, no hyperpigmentation, vitiligo, or suspicious lesions  Neurological Examination   Mental Status: Lethargic.  No speech Follows simple commands.   Cranial Nerves: II: Discs flat bilaterally; Blinks to bilateral confrontation, pupils equal, round, reactive to light and accommodation III,IV, VI: ptosis not present, extra-ocular motions intact bilaterally V,VII: smile symmetric, facial light touch sensation normal bilaterally VIII: hearing normal bilaterally IX,X: gag reflex present XI: bilateral shoulder shrug XII: midline tongue extension Motor: Moves all extremities against gravity and provides weak hand grip bilaterally. Sensory: Responds to noxious stimuli throughout Deep Tendon Reflexes: 2+ and symmetric with absent AJ's bilaterally Plantars: Right: downgoing   Left: downgoing Cerebellar: Unable to test due to lethargy Gait: not tested due to safety concerns    Laboratory Studies:   Basic Metabolic Panel: Recent Labs  Lab 08/12/17 1131  NA 131*  K 4.3  CL 95*   CO2 28  GLUCOSE 532*  BUN 23*  CREATININE 1.35*  CALCIUM 8.8*    Liver Function Tests: Recent Labs  Lab 08/12/17 1131  AST 31  ALT 16  ALKPHOS 85  BILITOT 1.3*  PROT 7.1  ALBUMIN 3.7   No results for input(s): LIPASE, AMYLASE in the last 168 hours. No results for input(s): AMMONIA in the last 168 hours.  CBC: Recent Labs  Lab 08/12/17 1131  WBC 11.6*  NEUTROABS 9.4*  HGB 12.4  HCT 38.9  MCV 85.5  PLT 247    Cardiac Enzymes: Recent Labs  Lab 08/12/17 1131  TROPONINI 0.07*    BNP: Invalid input(s): POCBNP  CBG: Recent Labs  Lab 08/12/17 1310 08/12/17 2128 08/13/17 0826 08/13/17 1232  GLUCAP 436* 227* 285* 61*    Microbiology: Results for orders placed or performed during the hospital encounter of 09/10/14  Surgical pcr screen     Status: None   Collection Time: 09/10/14 12:02 PM  Result Value Ref Range Status   MRSA, PCR NEGATIVE NEGATIVE Final   Staphylococcus aureus NEGATIVE NEGATIVE Final    Comment:        The Xpert SA Assay (FDA approved for NASAL specimens in patients over 62 years of age), is one component of a comprehensive surveillance program.  Test performance has been validated by [facility] for patients greater than or equal to 61 year old. It is not intended to diagnose infection nor to guide or monitor treatment.     Coagulation Studies: Recent Labs    08/12/17 1131  LABPROT 13.2  INR 1.01    Urinalysis:  Recent Labs  Lab 08/12/17 1315  COLORURINE YELLOW*  LABSPEC 1.027  PHURINE 5.0  GLUCOSEU >=500*  HGBUR SMALL*  BILIRUBINUR NEGATIVE  KETONESUR 5*  PROTEINUR 30*  NITRITE NEGATIVE  LEUKOCYTESUR NEGATIVE    Lipid Panel:     Component Value Date/Time   CHOL 147 08/13/2017 0439   CHOL 138 07/03/2013 0442   TRIG 91 08/13/2017 0439   TRIG 77 07/03/2013 0442   HDL 46 08/13/2017 0439   HDL 41 07/03/2013 0442   CHOLHDL 3.2 08/13/2017 0439   VLDL 18 08/13/2017 0439   VLDL  15 07/03/2013 0442    LDLCALC 83 08/13/2017 0439   LDLCALC 82 07/03/2013 0442    HgbA1C:  Lab Results  Component Value Date   HGBA1C 13.1 (H) 08/13/2017    Urine Drug Screen:      Component Value Date/Time   LABOPIA NONE DETECTED 08/12/2017 1315   COCAINSCRNUR NONE DETECTED 08/12/2017 1315   LABBENZ NONE DETECTED 08/12/2017 1315   AMPHETMU NONE DETECTED 08/12/2017 1315   THCU NONE DETECTED 08/12/2017 1315   LABBARB NONE DETECTED 08/12/2017 1315    Alcohol Level:  Recent Labs  Lab 08/12/17 1131  ETH <10    Other results: EKG: sinus rhythm at 97 bpm.  Imaging: Dg Chest 2 View  Result Date: 08/12/2017 CLINICAL DATA:  Patient poor historian at this time. Per ED RN notes: Pt arrived via EMS from home with reports of aphasia that started at an unknown time. Son states pt fell last evening and was at her house from about 8pm until 9pm. EXAM: CHEST - 2 VIEW COMPARISON:  02/29/2016 FINDINGS: Heart size is accentuated by the AP technique. There are no focal consolidations or pleural effusions. No pulmonary edema. There is midthoracic spondylosis. IMPRESSION: No active cardiopulmonary disease. Electronically Signed   By: Nolon Nations M.D.   On: 08/12/2017 13:16   Ct Head Wo Contrast  Result Date: 08/12/2017 CLINICAL DATA:  Aphasia.  Fell last night. EXAM: CT HEAD WITHOUT CONTRAST TECHNIQUE: Contiguous axial images were obtained from the base of the skull through the vertex without intravenous contrast. COMPARISON:  Brain MR dated 09/02/2016 and head CT dated 09/02/2016. FINDINGS: Brain: Diffusely enlarged ventricles and subarachnoid spaces. Patchy white matter low density in both cerebral hemispheres. No intracranial hemorrhage, mass lesion or CT evidence of acute infarction. Vascular: No hyperdense vessel or unexpected calcification. Skull: Normal. Negative for fracture or focal lesion. Sinuses/Orbits: Unremarkable. Other: None. IMPRESSION: 1. No acute abnormality. 2. Mild diffuse cerebral and cerebellar  atrophy and mild chronic small vessel white matter ischemic changes in both cerebral hemispheres. Electronically Signed   By: Claudie Revering M.D.   On: 08/12/2017 12:12   Mr Brain Wo Contrast  Result Date: 08/13/2017 CLINICAL DATA:  Aphasia.  Hyperglycemia. EXAM: MRI HEAD WITHOUT CONTRAST MRA HEAD WITHOUT CONTRAST TECHNIQUE: Multiplanar, multiecho pulse sequences of the brain and surrounding structures were obtained without intravenous contrast. Angiographic images of the head were obtained using MRA technique without contrast. COMPARISON:  Limited brain MRI 08/12/2017. Head CT 08/12/2017. Brain MRI/MRA 09/02/2016. FINDINGS: MRI HEAD FINDINGS Brain: No acute infarct, mass, midline shift, or extra-axial fluid collection is identified. Chronic microhemorrhages are again seen in the thalami, right lentiform nucleus, and pons suggesting chronic hypertension as the etiology. Patchy T2 hyperintensities in the cerebral white matter, thalami, and pons were more conspicuous on the 2018 MRI (less motion and higher magnetic field strength) but have likely not significantly changed and are nonspecific but compatible with moderate chronic small vessel ischemic disease. Chronic lacunar infarcts are present in the left corona radiata, posterior left frontal subcortical white matter, and right cerebellum. Mild cerebral atrophy is within normal limits for age. Vascular: Major intracranial vascular flow voids are preserved. Skull and upper cervical spine: Unremarkable calvarial bone marrow signal. Chronic bone marrow heterogeneity in the visualized cervical spine with underlying disc degeneration. Sinuses/Orbits: Bilateral cataract extraction. Paranasal sinuses and mastoid air cells are clear. Other: None. MRA HEAD FINDINGS The study is moderately motion degraded. The visualized distal vertebral arteries are patent to the basilar with the left being  dominant. There is an apparent moderate distal right V4 stenosis which is likely  accentuated by motion artifact. PICAs, AICAs, and SCAs are grossly patent though not well evaluated due to motion. The basilar artery is patent without evidence of significant stenosis allowing for prominent motion artifact distally. PCAs are patent without evidence of flow limiting proximal stenosis. There is an improved appearance of the right P1-P2 junction suggesting that the moderate narrowing on the prior MRA was largely artifactual. The internal carotid arteries are patent from skull base to carotid termini with prominent motion artifact through the cavernous segments and no evidence of significant ICA stenosis elsewhere. The MCAs are patent without evidence of significant M1 stenosis or proximal branch occlusion although motion artifact limits branch vessel evaluation. There is rather extensive motion artifact through the ACAs with the right A1 segment being poorly visualized, particularly proximally. No sizable aneurysm is identified. IMPRESSION: 1. No acute intracranial abnormality. 2. Moderate chronic small vessel ischemic disease. 3. Motion degraded MRA. Poorly visualized right A1 segment for which a high-grade proximal stenosis is not excluded. 4. No evidence of high-grade intracranial ICA or M1 stenosis. 5. Moderate distal right V4 stenosis versus artifact. Electronically Signed   By: Logan Bores M.D.   On: 08/13/2017 11:25   Mr Brain Wo Contrast  Result Date: 08/12/2017 CLINICAL DATA:  Altered mental status. Fell last night. Hyperglycemia. History of poorly controlled diabetes, stroke, hypertension, hyperlipidemia. EXAM: MRI HEAD WITHOUT CONTRAST TECHNIQUE: Sagittal T1, axial and coronal diffusion weighted imaging, axial T2 sequences obtained. Patient terminated the examination by climbing out of the scanner. COMPARISON:  CT HEAD August 12, 2016 and MRI of the head Sep 02, 2016 FINDINGS: Moderately motion degraded examination. INTRACRANIAL CONTENTS: No reduced diffusion to suggest acute ischemia,  hypercellular tumor or infection. Limited assessment for blood products, no lobar hematoma. Old LEFT basal ganglia versus corona radiata infarct. Old small cerebellar infarcts. No hydrocephalus. Probable moderate chronic small vessel ischemic disease. No midline shift or mass effect. No abnormal extra-axial fluid collections. VASCULAR: Normal major intracranial vascular flow voids present at skull base. SKULL AND UPPER CERVICAL SPINE: No abnormal sellar expansion. No suspicious calvarial bone marrow signal. Craniocervical junction maintained. SINUSES/ORBITS: The mastoid air-cells and included paranasal sinuses are well-aerated.The included ocular globes and orbital contents are non-suspicious. Bilateral ocular lens implants. OTHER: None. IMPRESSION: 1. Limited 4 sequence MRI of the head, degraded by motion. No acute intracranial process. Electronically Signed   By: Elon Alas M.D.   On: 08/12/2017 15:41   US Carotid Bilateral (at [facility] And Ap Only)  Result Date: 08/13/2017 CLINICAL DATA:  Stroke EXAM: BILATERAL CAROTID DUPLEX ULTRASOUND TECHNIQUE: Pearline Cables scale imaging, color Doppler and duplex ultrasound were performed of bilateral carotid and vertebral arteries in the neck. COMPARISON:  None. FINDINGS: Criteria: Quantification of carotid stenosis is based on velocity parameters that correlate the residual internal carotid diameter with NASCET-based stenosis levels, using the diameter of the distal internal carotid lumen as the denominator for stenosis measurement. The following velocity measurements were obtained: RIGHT ICA:  81 cm/sec CCA:  90 cm/sec SYSTOLIC ICA/CCA RATIO:  0.9 DIASTOLIC ICA/CCA RATIO: ECA:  169 cm/sec LEFT ICA:  55 cm/sec CCA:  387 cm/sec SYSTOLIC ICA/CCA RATIO:  0.5 DIASTOLIC ICA/CCA RATIO: ECA:  89 cm/sec RIGHT CAROTID ARTERY: Moderate calcified plaque in the bulb. Low resistance internal carotid Doppler pattern. RIGHT VERTEBRAL ARTERY:  Antegrade. LEFT CAROTID ARTERY: Mild smooth  mixed plaque in the bulb. Low resistance internal carotid Doppler pattern. LEFT VERTEBRAL ARTERY:  Antegrade. IMPRESSION: Less than 50% stenosis in the right and left internal carotid arteries. Electronically Signed   By: Marybelle Killings M.D.   On: 08/13/2017 09:58   Mr Jodene Nam Head/brain GE Cm  Result Date: 08/13/2017 CLINICAL DATA:  Aphasia.  Hyperglycemia. EXAM: MRI HEAD WITHOUT CONTRAST MRA HEAD WITHOUT CONTRAST TECHNIQUE: Multiplanar, multiecho pulse sequences of the brain and surrounding structures were obtained without intravenous contrast. Angiographic images of the head were obtained using MRA technique without contrast. COMPARISON:  Limited brain MRI 08/12/2017. Head CT 08/12/2017. Brain MRI/MRA 09/02/2016. FINDINGS: MRI HEAD FINDINGS Brain: No acute infarct, mass, midline shift, or extra-axial fluid collection is identified. Chronic microhemorrhages are again seen in the thalami, right lentiform nucleus, and pons suggesting chronic hypertension as the etiology. Patchy T2 hyperintensities in the cerebral white matter, thalami, and pons were more conspicuous on the 2018 MRI (less motion and higher magnetic field strength) but have likely not significantly changed and are nonspecific but compatible with moderate chronic small vessel ischemic disease. Chronic lacunar infarcts are present in the left corona radiata, posterior left frontal subcortical white matter, and right cerebellum. Mild cerebral atrophy is within normal limits for age. Vascular: Major intracranial vascular flow voids are preserved. Skull and upper cervical spine: Unremarkable calvarial bone marrow signal. Chronic bone marrow heterogeneity in the visualized cervical spine with underlying disc degeneration. Sinuses/Orbits: Bilateral cataract extraction. Paranasal sinuses and mastoid air cells are clear. Other: None. MRA HEAD FINDINGS The study is moderately motion degraded. The visualized distal vertebral arteries are patent to the basilar with  the left being dominant. There is an apparent moderate distal right V4 stenosis which is likely accentuated by motion artifact. PICAs, AICAs, and SCAs are grossly patent though not well evaluated due to motion. The basilar artery is patent without evidence of significant stenosis allowing for prominent motion artifact distally. PCAs are patent without evidence of flow limiting proximal stenosis. There is an improved appearance of the right P1-P2 junction suggesting that the moderate narrowing on the prior MRA was largely artifactual. The internal carotid arteries are patent from skull base to carotid termini with prominent motion artifact through the cavernous segments and no evidence of significant ICA stenosis elsewhere. The MCAs are patent without evidence of significant M1 stenosis or proximal branch occlusion although motion artifact limits branch vessel evaluation. There is rather extensive motion artifact through the ACAs with the right A1 segment being poorly visualized, particularly proximally. No sizable aneurysm is identified. IMPRESSION: 1. No acute intracranial abnormality. 2. Moderate chronic small vessel ischemic disease. 3. Motion degraded MRA. Poorly visualized right A1 segment for which a high-grade proximal stenosis is not excluded. 4. No evidence of high-grade intracranial ICA or M1 stenosis. 5. Moderate distal right V4 stenosis versus artifact. Electronically Signed   By: Logan Bores M.D.   On: 08/13/2017 11:25     Assessment/Plan: 77 year old female with history of stroke presenting with mutism and facial twitching.  Video made by family and per observation does appear to be a seizure.  Aphasia likely a post-ictal phenomenon.  Patient started on Keppra.  MRI of the brain reviewed and shows no evidence of acute changes.  Seizure likely a consequence of past stroke.    Recommendations: 1.  Seizure precautions 2.  Continue Keppra at 544m BID.  Would change to po at same dose once able.   May increase to 7558mBID if further seizure activity becomes apparent.    3.  EEG pending 4.  Blood sugar management.  Spoke with sons concerning medication compliance (A!C 13.1) 5.  Continue Plavix at 25m daily  LAlexis Goodell MD Neurology [phone]/22/2019, 2:28 PM

## 2017-08-13 NOTE — Progress Notes (Signed)
*  PRELIMINARY RESULTS* Echocardiogram 2D Echocardiogram has been performed.  Kristen [name] 08/13/2017, 12:30 PM

## 2017-08-13 NOTE — Clinical Social Work Note (Signed)
Clinical Social Work Assessment  Patient Details  Name: Kristen [name] MRN: [id number] Date of Birth: [birth year]  Date of referral:  08/13/17               Reason for consult:  Facility Placement                Permission sought to share information with:  Chartered certified accountant granted to share information::  Yes, Verbal Permission Granted  Name::      Kristen [name]::   [place]   Relationship::     Contact Information:     Housing/Transportation Living arrangements for the past 2 months:  [place] of Information:  Adult Children Patient Interpreter Needed:  None Criminal Activity/Legal Involvement Pertinent to Current Situation/Hospitalization:  No - Comment as needed Significant Relationships:  Adult Children Lives with:  Self Do you feel safe going back to the place where you live?  Yes Need for family participation in patient care:  Yes (Comment)  Care giving concerns: Patient lives alone in [place].    Social Worker assessment / plan: Holiday representative (CSW) reviewed chart and noted that PT is recommending SNF. CSW met with patient and her son Kristen [name] was at bedside. Patient did not participate in assessment. Per son patient lives alone in [place] and has 4 adult sons. Per Kristen [name] he lives in [place] and his brothers Shanon Brow and [name] live in [place]. CSW explained that PT is recommending SNF and that [facility] will have to approve it. Son is agreeable to SNF search in [place]. FL2 complete and faxed out. CSW will continue to follow and assist as needed.   Employment status:  Retired Nurse, adult PT Recommendations:  [place] / Referral to community resources:  [place]  Patient/Family's Response to care: Patient's son Kristen [name] is agreeable to SNF search in [place].   Patient/Family's Understanding of and Emotional  Response to Diagnosis, Current Treatment, and Prognosis: Patient's son was very pleasant and thanked CSW for assistance.   Emotional Assessment Appearance:  Appears stated age Attitude/Demeanor/Rapport:    Affect (typically observed):  Unable to Assess Orientation:  Oriented to Self, Oriented to Place, Oriented to  Time, Fluctuating Orientation (Suspected and/or reported Sundowners) Alcohol / Substance use:  Not Applicable Psych involvement (Current and /or in the community):  No (Comment)  Discharge Needs  Concerns to be addressed:  Discharge Planning Concerns Readmission within the last 30 days:  No Current discharge risk:  Dependent with Mobility Barriers to Discharge:  Continued Medical Work up   [employer], Veronia Beets, LCSW 08/13/2017, 6:41 PM

## 2017-08-13 NOTE — Progress Notes (Signed)
OT Cancellation Note  Patient Details Name: Kristen [name] MRN: [id number] DOB: 1940/05/26   Cancelled Treatment:    Reason Eval/Treat Not Completed: Medical issues which prohibited therapy. Per chart review the pt with hyperglycemia (most recently 57) and RN has noted 3 episodes of seizure (Keppra initiated and Ativan as needed).  Pending neurology consult, MRI, and ultrasound. Not medically appropriate at this time for OT evaluation. Will hold until consult complete and pt more medically appropriate for OT evaluation.   Jeni Salles, MPH, MS, OTR/L ascom [phone] 08/13/17, 7:57 AM

## 2017-08-13 NOTE — Progress Notes (Signed)
SLP Cancellation Note  Patient Details Name: Kristen [name] MRN: [id number] DOB: [birth year]   Cancelled treatment:       Reason Eval/Treat Not Completed: Patient at procedure or test/unavailable(NSG consulted. ST will f/u when back in room. )    Orinda Kenner, [place], CCC-SLP [facility] 08/13/2017, 10:27 AM

## 2017-08-14 DIAGNOSIS — R569 Unspecified convulsions: Secondary | ICD-10-CM

## 2017-08-14 DIAGNOSIS — R4701 Aphasia: Secondary | ICD-10-CM

## 2017-08-14 LAB — GLUCOSE, CAPILLARY
Glucose-Capillary: 157 mg/dL — ABNORMAL HIGH (ref 65–99)
Glucose-Capillary: 158 mg/dL — ABNORMAL HIGH (ref 65–99)
Glucose-Capillary: 199 mg/dL — ABNORMAL HIGH (ref 65–99)
Glucose-Capillary: 315 mg/dL — ABNORMAL HIGH (ref 65–99)

## 2017-08-14 MED ORDER — DIVALPROEX SODIUM 500 MG PO DR TAB
500.0000 mg | DELAYED_RELEASE_TABLET | Freq: Two times a day (BID) | ORAL | Status: DC
  Administered 2017-08-15: 500 mg via ORAL
  Filled 2017-08-14: qty 1

## 2017-08-14 MED ORDER — VALPROATE SODIUM 500 MG/5ML IV SOLN
1000.0000 mg | Freq: Once | INTRAVENOUS | Status: AC
  Administered 2017-08-14: 17:00:00 1000 mg via INTRAVENOUS
  Filled 2017-08-14: qty 10

## 2017-08-14 MED ORDER — HYDRALAZINE HCL 25 MG PO TABS
50.0000 mg | ORAL_TABLET | Freq: Three times a day (TID) | ORAL | Status: DC
  Administered 2017-08-14 – 2017-08-18 (×12): 50 mg via ORAL
  Filled 2017-08-14 (×2): qty 2
  Filled 2017-08-14 (×2): qty 1
  Filled 2017-08-14 (×3): qty 2
  Filled 2017-08-14: qty 1
  Filled 2017-08-14 (×3): qty 2
  Filled 2017-08-14: qty 1

## 2017-08-14 MED ORDER — INSULIN STARTER KIT- PEN NEEDLES (ENGLISH)
1.0000 | Freq: Once | Status: AC
  Administered 2017-08-14: 17:00:00 1
  Filled 2017-08-14: qty 1

## 2017-08-14 MED ORDER — LEVETIRACETAM 500 MG PO TABS
500.0000 mg | ORAL_TABLET | Freq: Two times a day (BID) | ORAL | Status: DC
  Filled 2017-08-14 (×2): qty 1

## 2017-08-14 NOTE — Progress Notes (Signed)
Subjective: Patient awake and alert but remains nonverbal.  Was verbal for a short period of time on yesterday but did not remains so.    Objective: Current vital signs: BP (!) 189/82 (BP Location: Right Arm)   Pulse 60   Temp 98 F (36.7 C) (Oral)   Resp 17   Ht _0  (1.626 m)   Wt 99.8 kg (220 lb)   SpO2 98%   BMI 37.76 kg/m  Vital signs in last 24 hours: Temp:  [98 F (36.7 C)-98.4 F (36.9 C)] 98 F (36.7 C) (04/23 0557) Pulse Rate:  [55-68] 60 (04/23 0557) Resp:  [16-17] 17 (04/23 0557) BP: (156-189)/(67-82) 189/82 (04/23 0557) SpO2:  [97 %-98 %] 98 % (04/23 0557)  Intake/Output from previous day: No intake/output data recorded. Intake/Output this shift: No intake/output data recorded. Nutritional status: Fall precautions Aspiration precautions Seizure precautions Aspiration precautions Fall precautions DIET - DYS 1 Room service appropriate? Yes with Assist; Fluid consistency: Thin  Neurologic Exam: Mental Status: Alert and awake.  No speech.  Moans at attempt at speech.  Unable to follow commands.   Cranial Nerves: II: Discs flat bilaterally; Blinks to bilateral confrontation, pupils equal, round, reactive to light and accommodation III,IV, VI: ptosis not present, extra-ocular motions intact bilaterally V,VII: smile symmetric, facial light touch sensation normal bilaterally VIII: hearing normal bilaterally IX,X: gag reflex present XI: bilateral shoulder shrug XII: midline tongue extension Motor: Moves all extremities against gravity    Lab Results: Basic Metabolic Panel: Recent Labs  Lab 08/12/17 1131  NA 131*  K 4.3  CL 95*  CO2 28  GLUCOSE 532*  BUN 23*  CREATININE 1.35*  CALCIUM 8.8*    Liver Function Tests: Recent Labs  Lab 08/12/17 1131  AST 31  ALT 16  ALKPHOS 85  BILITOT 1.3*  PROT 7.1  ALBUMIN 3.7   No results for input(s): LIPASE, AMYLASE in the last 168 hours. No results for input(s): AMMONIA in the last 168  hours.  CBC: Recent Labs  Lab 08/12/17 1131  WBC 11.6*  NEUTROABS 9.4*  HGB 12.4  HCT 38.9  MCV 85.5  PLT 247    Cardiac Enzymes: Recent Labs  Lab 08/12/17 1131  TROPONINI 0.07*    Lipid Panel: Recent Labs  Lab 08/13/17 0439  CHOL 147  TRIG 91  HDL 46  CHOLHDL 3.2  VLDL 18  LDLCALC 83    CBG: Recent Labs  Lab 08/13/17 1232 08/13/17 1648 08/13/17 2128 08/14/17 0005 08/14/17 0754  GLUCAP 236* 344* 161* 157* 158*    Microbiology: Results for orders placed or performed during the hospital encounter of 09/10/14  Surgical pcr screen     Status: None   Collection Time: 09/10/14 12:02 PM  Result Value Ref Range Status   MRSA, PCR NEGATIVE NEGATIVE Final   Staphylococcus aureus NEGATIVE NEGATIVE Final    Comment:        The Xpert SA Assay (FDA approved for NASAL specimens in patients over 10 years of age), is one component of a comprehensive surveillance program.  Test performance has been validated by [facility] for patients greater than or equal to 75 year old. It is not intended to diagnose infection nor to guide or monitor treatment.     Coagulation Studies: Recent Labs    08/12/17 1131  LABPROT 13.2  INR 1.01    Imaging: Mr Brain Wo Contrast  Result Date: 08/13/2017 CLINICAL DATA:  Aphasia.  Hyperglycemia. EXAM: MRI HEAD WITHOUT CONTRAST MRA HEAD WITHOUT  CONTRAST TECHNIQUE: Multiplanar, multiecho pulse sequences of the brain and surrounding structures were obtained without intravenous contrast. Angiographic images of the head were obtained using MRA technique without contrast. COMPARISON:  Limited brain MRI 08/12/2017. Head CT 08/12/2017. Brain MRI/MRA 09/02/2016. FINDINGS: MRI HEAD FINDINGS Brain: No acute infarct, mass, midline shift, or extra-axial fluid collection is identified. Chronic microhemorrhages are again seen in the thalami, right lentiform nucleus, and pons suggesting chronic hypertension as the etiology. Patchy T2  hyperintensities in the cerebral white matter, thalami, and pons were more conspicuous on the 2018 MRI (less motion and higher magnetic field strength) but have likely not significantly changed and are nonspecific but compatible with moderate chronic small vessel ischemic disease. Chronic lacunar infarcts are present in the left corona radiata, posterior left frontal subcortical white matter, and right cerebellum. Mild cerebral atrophy is within normal limits for age. Vascular: Major intracranial vascular flow voids are preserved. Skull and upper cervical spine: Unremarkable calvarial bone marrow signal. Chronic bone marrow heterogeneity in the visualized cervical spine with underlying disc degeneration. Sinuses/Orbits: Bilateral cataract extraction. Paranasal sinuses and mastoid air cells are clear. Other: None. MRA HEAD FINDINGS The study is moderately motion degraded. The visualized distal vertebral arteries are patent to the basilar with the left being dominant. There is an apparent moderate distal right V4 stenosis which is likely accentuated by motion artifact. PICAs, AICAs, and SCAs are grossly patent though not well evaluated due to motion. The basilar artery is patent without evidence of significant stenosis allowing for prominent motion artifact distally. PCAs are patent without evidence of flow limiting proximal stenosis. There is an improved appearance of the right P1-P2 junction suggesting that the moderate narrowing on the prior MRA was largely artifactual. The internal carotid arteries are patent from skull base to carotid termini with prominent motion artifact through the cavernous segments and no evidence of significant ICA stenosis elsewhere. The MCAs are patent without evidence of significant M1 stenosis or proximal branch occlusion although motion artifact limits branch vessel evaluation. There is rather extensive motion artifact through the ACAs with the right A1 segment being poorly visualized,  particularly proximally. No sizable aneurysm is identified. IMPRESSION: 1. No acute intracranial abnormality. 2. Moderate chronic small vessel ischemic disease. 3. Motion degraded MRA. Poorly visualized right A1 segment for which a high-grade proximal stenosis is not excluded. 4. No evidence of high-grade intracranial ICA or M1 stenosis. 5. Moderate distal right V4 stenosis versus artifact. Electronically Signed   By: Logan Bores M.D.   On: 08/13/2017 11:25   Mr Brain Wo Contrast  Result Date: 08/12/2017 CLINICAL DATA:  Altered mental status. Fell last night. Hyperglycemia. History of poorly controlled diabetes, stroke, hypertension, hyperlipidemia. EXAM: MRI HEAD WITHOUT CONTRAST TECHNIQUE: Sagittal T1, axial and coronal diffusion weighted imaging, axial T2 sequences obtained. Patient terminated the examination by climbing out of the scanner. COMPARISON:  CT HEAD August 12, 2016 and MRI of the head Sep 02, 2016 FINDINGS: Moderately motion degraded examination. INTRACRANIAL CONTENTS: No reduced diffusion to suggest acute ischemia, hypercellular tumor or infection. Limited assessment for blood products, no lobar hematoma. Old LEFT basal ganglia versus corona radiata infarct. Old small cerebellar infarcts. No hydrocephalus. Probable moderate chronic small vessel ischemic disease. No midline shift or mass effect. No abnormal extra-axial fluid collections. VASCULAR: Normal major intracranial vascular flow voids present at skull base. SKULL AND UPPER CERVICAL SPINE: No abnormal sellar expansion. No suspicious calvarial bone marrow signal. Craniocervical junction maintained. SINUSES/ORBITS: The mastoid air-cells and included paranasal sinuses are  well-aerated.The included ocular globes and orbital contents are non-suspicious. Bilateral ocular lens implants. OTHER: None. IMPRESSION: 1. Limited 4 sequence MRI of the head, degraded by motion. No acute intracranial process. Electronically Signed   By: Elon Alas  M.D.   On: 08/12/2017 15:41   US Carotid Bilateral (at [facility] Only)  Result Date: 08/13/2017 CLINICAL DATA:  Stroke EXAM: BILATERAL CAROTID DUPLEX ULTRASOUND TECHNIQUE: Pearline Cables scale imaging, color Doppler and duplex ultrasound were performed of bilateral carotid and vertebral arteries in the neck. COMPARISON:  None. FINDINGS: Criteria: Quantification of carotid stenosis is based on velocity parameters that correlate the residual internal carotid diameter with NASCET-based stenosis levels, using the diameter of the distal internal carotid lumen as the denominator for stenosis measurement. The following velocity measurements were obtained: RIGHT ICA:  81 cm/sec CCA:  90 cm/sec SYSTOLIC ICA/CCA RATIO:  0.9 DIASTOLIC ICA/CCA RATIO: ECA:  169 cm/sec LEFT ICA:  55 cm/sec CCA:  735 cm/sec SYSTOLIC ICA/CCA RATIO:  0.5 DIASTOLIC ICA/CCA RATIO: ECA:  89 cm/sec RIGHT CAROTID ARTERY: Moderate calcified plaque in the bulb. Low resistance internal carotid Doppler pattern. RIGHT VERTEBRAL ARTERY:  Antegrade. LEFT CAROTID ARTERY: Mild smooth mixed plaque in the bulb. Low resistance internal carotid Doppler pattern. LEFT VERTEBRAL ARTERY:  Antegrade. IMPRESSION: Less than 50% stenosis in the right and left internal carotid arteries. Electronically Signed   By: Marybelle Killings M.D.   On: 08/13/2017 09:58   Mr Jodene Nam Head/brain HG Cm  Result Date: 08/13/2017 CLINICAL DATA:  Aphasia.  Hyperglycemia. EXAM: MRI HEAD WITHOUT CONTRAST MRA HEAD WITHOUT CONTRAST TECHNIQUE: Multiplanar, multiecho pulse sequences of the brain and surrounding structures were obtained without intravenous contrast. Angiographic images of the head were obtained using MRA technique without contrast. COMPARISON:  Limited brain MRI 08/12/2017. Head CT 08/12/2017. Brain MRI/MRA 09/02/2016. FINDINGS: MRI HEAD FINDINGS Brain: No acute infarct, mass, midline shift, or extra-axial fluid collection is identified. Chronic microhemorrhages are again seen in the thalami,  right lentiform nucleus, and pons suggesting chronic hypertension as the etiology. Patchy T2 hyperintensities in the cerebral white matter, thalami, and pons were more conspicuous on the 2018 MRI (less motion and higher magnetic field strength) but have likely not significantly changed and are nonspecific but compatible with moderate chronic small vessel ischemic disease. Chronic lacunar infarcts are present in the left corona radiata, posterior left frontal subcortical white matter, and right cerebellum. Mild cerebral atrophy is within normal limits for age. Vascular: Major intracranial vascular flow voids are preserved. Skull and upper cervical spine: Unremarkable calvarial bone marrow signal. Chronic bone marrow heterogeneity in the visualized cervical spine with underlying disc degeneration. Sinuses/Orbits: Bilateral cataract extraction. Paranasal sinuses and mastoid air cells are clear. Other: None. MRA HEAD FINDINGS The study is moderately motion degraded. The visualized distal vertebral arteries are patent to the basilar with the left being dominant. There is an apparent moderate distal right V4 stenosis which is likely accentuated by motion artifact. PICAs, AICAs, and SCAs are grossly patent though not well evaluated due to motion. The basilar artery is patent without evidence of significant stenosis allowing for prominent motion artifact distally. PCAs are patent without evidence of flow limiting proximal stenosis. There is an improved appearance of the right P1-P2 junction suggesting that the moderate narrowing on the prior MRA was largely artifactual. The internal carotid arteries are patent from skull base to carotid termini with prominent motion artifact through the cavernous segments and no evidence of significant ICA stenosis elsewhere. The MCAs are patent without evidence  of significant M1 stenosis or proximal branch occlusion although motion artifact limits branch vessel evaluation. There is rather  extensive motion artifact through the ACAs with the right A1 segment being poorly visualized, particularly proximally. No sizable aneurysm is identified. IMPRESSION: 1. No acute intracranial abnormality. 2. Moderate chronic small vessel ischemic disease. 3. Motion degraded MRA. Poorly visualized right A1 segment for which a high-grade proximal stenosis is not excluded. 4. No evidence of high-grade intracranial ICA or M1 stenosis. 5. Moderate distal right V4 stenosis versus artifact. Electronically Signed   By: Logan Bores M.D.   On: 08/13/2017 11:25    Medications:  I have reviewed the patient's current medications. Scheduled: . aspirin  300 mg Rectal Daily   Or  . aspirin  325 mg Oral Daily  . atorvastatin  40 mg Oral q1800  . clopidogrel  75 mg Oral Daily  . [START ON 08/15/2017] divalproex  500 mg Oral BID  . enoxaparin (LOVENOX) injection  40 mg Subcutaneous Q24H  . hydrALAZINE  50 mg Oral Q8H  . hydrochlorothiazide  12.5 mg Oral Daily  . insulin aspart  0-9 Units Subcutaneous TID WC  . insulin glargine  15 Units Subcutaneous QHS  . insulin starter kit- pen needles  1 kit Other Once  . losartan  100 mg Oral Daily    Assessment/Plan: No further seizure activity noted but patient remain nonverbal after a period of improvement on yesterday.  EEG shows some left parietal sharp activity.  Patient on Keppra.  Unclear if patient experiencing side effects to Keppra.    Recommendations: 1.  D/C Keppra 2.  Start Depacon 1092m IV today with maintenance of 5062mpo BID starting tomorrow.   3.  Continue seizure precautions 4.  Depakote level in AM    LOS: 2 days   LeAlexis GoodellMD Neurology [id number]/23/2019  1:01 PM

## 2017-08-14 NOTE — Progress Notes (Addendum)
Inpatient Diabetes Program Recommendations  AACE/ADA: New Consensus Statement on Inpatient Glycemic Control (2015)  Target Ranges:  Prepandial:   less than 140 mg/dL      Peak postprandial:   less than 180 mg/dL (1-2 hours)      Critically ill patients:  140 - 180 mg/dL  Results for Kristen [name], Kristen [name] (MRN [id number]) as of 08/14/2017 08:19  Ref. Range 08/13/2017 08:26 08/13/2017 12:32 08/13/2017 16:48 08/13/2017 21:28 08/14/2017 00:05 08/14/2017 07:54  Glucose-Capillary Latest Ref Range: 65 - 99 mg/dL 285 (H) 236 (H) 344 (H) 161 (H) 157 (H) 158 (H)    Results for Kristen [name], Kristen [name] (MRN [id number]) as of 08/13/2017 08:38  Ref. Range 03/12/2017 09:42 08/13/2017 04:39  Hemoglobin A1C Latest Ref Range: 4.8 - 5.6 % 6.6 (H) 13.1 (H)   Review of Glycemic Control  Diabetes history: DM2 Outpatient Diabetes medications: Amaryl 4 mg QAM, Actos 30 mg daily, Duetact 30-2 mg QAM (combination of Amaryl and Actos); per home med list pt is not taking Actos or Duetact Current orders for Inpatient glycemic control: Lantus 15 units QHS, Novolog 0-9 units TID with meals  Inpatient Diabetes Program Recommendations:  Correction (SSI): Please consider ordering Novolog 0-5 units QHS for bedtime correction. Insulin-Meal Coverage: Please consider ordering Novolog 4 units TID with meals for meal coverage if patient eats at least 50% of meals. HgbA1C: A1C 13.1% on 08/13/17 indicating an average glucose of 329 mg/dl over the past 2-3 months. Per MD note, patient will be discharged on insulin.  NOTE:  Noted patient may be discharged to SNF an MD notes patient will need insulin at discharge. Will attempt to see patient again today to see if she is more alert and able to engage in discussion about DM control.  Addendum 08/14/17'@10' :15-Spoke with patient and her son Kristen [name] about diabetes and home regimen for diabetes control. Patient is able to speak clearly at times and then has intermittent periods of stuttering for 30 seconds or so.  Patient states that she has not been taking her outpatient DM medications routinely nor checking glucose consistently at home.  Discussed A1C results (13.1% on 08/13/17) and explained that her current A1C indicates an average glucose of 329 mg/dl over the past 2-3 months. Discussed glucose and A1C goals. Discussed importance of checking CBGs and maintaining good CBG control to prevent long-term and short-term complications. Stressed to the patient the importance of improving glycemic control to prevent further complications from uncontrolled diabetes. Discussed possibility of using insulin at home and patient states that she has used insulin pens in the past at home but her son is not aware of her ever using insulin as an outpatient. Patient reports that she was having "nervousness, shaking because my sugar got too low so my doctor took me off insulin."  Discussed Lantus insulin and how it works. Reviewed hypoglycemia along with proper treatment. Educated patient and son Kristen [name]) on insulin pen. Informed them that an insulin flexpen starter kit would be ordered for them to review. Reviewed all steps of insulin pen including attachment of needle, 2-unit air shot, dialing up dose, giving injection, removing needle, disposal of sharps, storage of unused insulin, disposal of insulin etc. Patient was able to provide return demonstration with a lot of verbal cues. Patient's son was able to provide successful return demonstration without any assistance.  Patient's son states that patient may be discharged to rehab facility and then go home. He understand that patient will likely need assistance at home with medications especially if  discharged home new to insulin. Patient's son asked about getting prescription for FreeStyle Libre (flash glucose monitoring sensor 14-day wear) to allow more frequent glucose monitoring and to provide more information on glycemic trends. Explained that if she is discharged on insulin pens she  would need prescription for the insulin pens and insulin pen needles. Patient and her son verbalized understanding of information discussed and they stated that they have no further questions at this time related to diabetes.   BEDSIDE RNs: Please use each patient interaction to provide diabetes education to patient and family. Please review Living Well with Diabetes booklet with the patient and instruct on insulin administration. Please allow patient and family to be actively engaged with diabetes management by allowing them to check glucose and administer insulin injections.   Thanks, Barnie Alderman, RN, MSN, CDE Diabetes Coordinator Inpatient Diabetes Program [phone] (Team Pager from 8am to 5pm)

## 2017-08-14 NOTE — Progress Notes (Signed)
OT Cancellation Note  Patient Details Name: Kristen [name] MRN: [id number] DOB: 07/24/1940   Cancelled Treatment:    Reason Eval/Treat Not Completed: Other (comment). On initial attempt to see this am, pt with nursing for patient care. Unavailable for OT evaluation. Will re-attempt at later date/time as pt is available and medically appropriate.   Jeni Salles, MPH, MS, OTR/L ascom [phone] 08/14/17, 11:30 AM

## 2017-08-14 NOTE — Progress Notes (Signed)
[place] [facility]                                                                                                                                                                                  Patient Demographics   Kristen [name], is a 77 y.o. female, DOB - 06-Mar-1941, HFW:[id number]  Admit date - 08/12/2017   Admitting Physician Demetrios Loll, MD  Outpatient Primary MD for the patient is [name], MD   LOS - 2  Subjective: Patient continues to be intermittently aphasic according to the son she is not back to baseline  Review of Systems:   CONSTITUTIONAL: Drowsy but able to follow commands  Vitals:   Vitals:   08/13/17 1516 08/13/17 2029 08/14/17 0403 08/14/17 0557  BP: (!) 157/74 (!) 156/67 (!) 187/69 (!) 189/82  Pulse: (!) 55 68 61 60  Resp: _0 Temp:  98.3 F (36.8 C) 98.4 F (36.9 C) 98 F (36.7 C)  TempSrc:  Oral Oral Oral  SpO2: 98% 97% 98% 98%  Weight:      Height:        Wt Readings from Last 3 Encounters:  08/12/17 99.8 kg (220 lb)  03/12/17 96.6 kg (213 lb)  12/01/16 96.2 kg (212 lb)     Intake/Output Summary (Last 24 hours) at 08/14/2017 1154 Last data filed at 08/14/2017 1012 Gross per 24 hour  Intake 0 ml  Output -  Net 0 ml    Physical Exam:   GENERAL: Pleasant-appearing in no apparent distress.  HEAD, EYES, EARS, NOSE AND THROAT: Atraumatic, normocephalic. Extraocular muscles are intact. Pupils equal and reactive to light. Sclerae anicteric. No conjunctival injection. No oro-pharyngeal erythema.  NECK: Supple. There is no jugular venous distention. No bruits, no lymphadenopathy, no thyromegaly.  HEART: Regular rate and rhythm,. No murmurs, no rubs, no clicks.  LUNGS: Clear to auscultation bilaterally. No rales or rhonchi. No wheezes.  ABDOMEN: Soft, flat, nontender, nondistended. Has good bowel sounds. No hepatosplenomegaly appreciated.  EXTREMITIES: No evidence of any cyanosis, clubbing, or  peripheral edema.  +2 pedal and radial pulses bilaterally.  NEUROLOGIC: Awake able to follow commands SKIN: Moist and warm with no rashes appreciated.  Psych: Not anxious, depressed LN: No inguinal LN enlargement    Antibiotics   Anti-infectives (From admission, onward)   None      Medications   Scheduled Meds: . aspirin  300 mg Rectal Daily   Or  . aspirin  325 mg Oral Daily  . atorvastatin  40 mg Oral q1800  . clopidogrel  75 mg Oral Daily  . enoxaparin (LOVENOX) injection  40 mg Subcutaneous Q24H  .  hydrALAZINE  50 mg Oral Q8H  . hydrochlorothiazide  12.5 mg Oral Daily  . insulin aspart  0-9 Units Subcutaneous TID WC  . insulin glargine  15 Units Subcutaneous QHS  . insulin starter kit- pen needles  1 kit Other Once  . levETIRAcetam  500 mg Oral BID  . losartan  100 mg Oral Daily   Continuous Infusions: . sodium chloride 75 mL/hr at 08/14/17 0422   PRN Meds:.bisacodyl, LORazepam, nitroGLYCERIN, senna-docusate   Data Review:   Micro Results No results found for this or any previous visit (from the past 240 hour(s)).  Radiology Reports Dg Chest 2 View  Result Date: 08/12/2017 CLINICAL DATA:  Patient poor historian at this time. Per ED RN notes: Pt arrived via EMS from home with reports of aphasia that started at an unknown time. Son states pt fell last evening and was at her house from about 8pm until 9pm. EXAM: CHEST - 2 VIEW COMPARISON:  02/29/2016 FINDINGS: Heart size is accentuated by the AP technique. There are no focal consolidations or pleural effusions. No pulmonary edema. There is midthoracic spondylosis. IMPRESSION: No active cardiopulmonary disease. Electronically Signed   By: Nolon Nations M.D.   On: 08/12/2017 13:16   Ct Head Wo Contrast  Result Date: 08/12/2017 CLINICAL DATA:  Aphasia.  Fell last night. EXAM: CT HEAD WITHOUT CONTRAST TECHNIQUE: Contiguous axial images were obtained from the base of the skull through the vertex without intravenous  contrast. COMPARISON:  Brain MR dated 09/02/2016 and head CT dated 09/02/2016. FINDINGS: Brain: Diffusely enlarged ventricles and subarachnoid spaces. Patchy white matter low density in both cerebral hemispheres. No intracranial hemorrhage, mass lesion or CT evidence of acute infarction. Vascular: No hyperdense vessel or unexpected calcification. Skull: Normal. Negative for fracture or focal lesion. Sinuses/Orbits: Unremarkable. Other: None. IMPRESSION: 1. No acute abnormality. 2. Mild diffuse cerebral and cerebellar atrophy and mild chronic small vessel white matter ischemic changes in both cerebral hemispheres. Electronically Signed   By: Claudie Revering M.D.   On: 08/12/2017 12:12   Mr Brain Wo Contrast  Result Date: 08/13/2017 CLINICAL DATA:  Aphasia.  Hyperglycemia. EXAM: MRI HEAD WITHOUT CONTRAST MRA HEAD WITHOUT CONTRAST TECHNIQUE: Multiplanar, multiecho pulse sequences of the brain and surrounding structures were obtained without intravenous contrast. Angiographic images of the head were obtained using MRA technique without contrast. COMPARISON:  Limited brain MRI 08/12/2017. Head CT 08/12/2017. Brain MRI/MRA 09/02/2016. FINDINGS: MRI HEAD FINDINGS Brain: No acute infarct, mass, midline shift, or extra-axial fluid collection is identified. Chronic microhemorrhages are again seen in the thalami, right lentiform nucleus, and pons suggesting chronic hypertension as the etiology. Patchy T2 hyperintensities in the cerebral white matter, thalami, and pons were more conspicuous on the 2018 MRI (less motion and higher magnetic field strength) but have likely not significantly changed and are nonspecific but compatible with moderate chronic small vessel ischemic disease. Chronic lacunar infarcts are present in the left corona radiata, posterior left frontal subcortical white matter, and right cerebellum. Mild cerebral atrophy is within normal limits for age. Vascular: Major intracranial vascular flow voids are  preserved. Skull and upper cervical spine: Unremarkable calvarial bone marrow signal. Chronic bone marrow heterogeneity in the visualized cervical spine with underlying disc degeneration. Sinuses/Orbits: Bilateral cataract extraction. Paranasal sinuses and mastoid air cells are clear. Other: None. MRA HEAD FINDINGS The study is moderately motion degraded. The visualized distal vertebral arteries are patent to the basilar with the left being dominant. There is an apparent moderate distal right V4 stenosis which  is likely accentuated by motion artifact. PICAs, AICAs, and SCAs are grossly patent though not well evaluated due to motion. The basilar artery is patent without evidence of significant stenosis allowing for prominent motion artifact distally. PCAs are patent without evidence of flow limiting proximal stenosis. There is an improved appearance of the right P1-P2 junction suggesting that the moderate narrowing on the prior MRA was largely artifactual. The internal carotid arteries are patent from skull base to carotid termini with prominent motion artifact through the cavernous segments and no evidence of significant ICA stenosis elsewhere. The MCAs are patent without evidence of significant M1 stenosis or proximal branch occlusion although motion artifact limits branch vessel evaluation. There is rather extensive motion artifact through the ACAs with the right A1 segment being poorly visualized, particularly proximally. No sizable aneurysm is identified. IMPRESSION: 1. No acute intracranial abnormality. 2. Moderate chronic small vessel ischemic disease. 3. Motion degraded MRA. Poorly visualized right A1 segment for which a high-grade proximal stenosis is not excluded. 4. No evidence of high-grade intracranial ICA or M1 stenosis. 5. Moderate distal right V4 stenosis versus artifact. Electronically Signed   By: Logan Bores M.D.   On: 08/13/2017 11:25   Mr Brain Wo Contrast  Result Date: 08/12/2017 CLINICAL  DATA:  Altered mental status. Fell last night. Hyperglycemia. History of poorly controlled diabetes, stroke, hypertension, hyperlipidemia. EXAM: MRI HEAD WITHOUT CONTRAST TECHNIQUE: Sagittal T1, axial and coronal diffusion weighted imaging, axial T2 sequences obtained. Patient terminated the examination by climbing out of the scanner. COMPARISON:  CT HEAD August 12, 2016 and MRI of the head Sep 02, 2016 FINDINGS: Moderately motion degraded examination. INTRACRANIAL CONTENTS: No reduced diffusion to suggest acute ischemia, hypercellular tumor or infection. Limited assessment for blood products, no lobar hematoma. Old LEFT basal ganglia versus corona radiata infarct. Old small cerebellar infarcts. No hydrocephalus. Probable moderate chronic small vessel ischemic disease. No midline shift or mass effect. No abnormal extra-axial fluid collections. VASCULAR: Normal major intracranial vascular flow voids present at skull base. SKULL AND UPPER CERVICAL SPINE: No abnormal sellar expansion. No suspicious calvarial bone marrow signal. Craniocervical junction maintained. SINUSES/ORBITS: The mastoid air-cells and included paranasal sinuses are well-aerated.The included ocular globes and orbital contents are non-suspicious. Bilateral ocular lens implants. OTHER: None. IMPRESSION: 1. Limited 4 sequence MRI of the head, degraded by motion. No acute intracranial process. Electronically Signed   By: Elon Alas M.D.   On: 08/12/2017 15:41   US Carotid Bilateral (at [facility] And Ap Only)  Result Date: 08/13/2017 CLINICAL DATA:  Stroke EXAM: BILATERAL CAROTID DUPLEX ULTRASOUND TECHNIQUE: Pearline Cables scale imaging, color Doppler and duplex ultrasound were performed of bilateral carotid and vertebral arteries in the neck. COMPARISON:  None. FINDINGS: Criteria: Quantification of carotid stenosis is based on velocity parameters that correlate the residual internal carotid diameter with NASCET-based stenosis levels, using the diameter of the  distal internal carotid lumen as the denominator for stenosis measurement. The following velocity measurements were obtained: RIGHT ICA:  81 cm/sec CCA:  90 cm/sec SYSTOLIC ICA/CCA RATIO:  0.9 DIASTOLIC ICA/CCA RATIO: ECA:  169 cm/sec LEFT ICA:  55 cm/sec CCA:  720 cm/sec SYSTOLIC ICA/CCA RATIO:  0.5 DIASTOLIC ICA/CCA RATIO: ECA:  89 cm/sec RIGHT CAROTID ARTERY: Moderate calcified plaque in the bulb. Low resistance internal carotid Doppler pattern. RIGHT VERTEBRAL ARTERY:  Antegrade. LEFT CAROTID ARTERY: Mild smooth mixed plaque in the bulb. Low resistance internal carotid Doppler pattern. LEFT VERTEBRAL ARTERY:  Antegrade. IMPRESSION: Less than 50% stenosis in the right and left  internal carotid arteries. Electronically Signed   By: Marybelle Killings M.D.   On: 08/13/2017 09:58   Mr Jodene Nam Head/brain AC Cm  Result Date: 08/13/2017 CLINICAL DATA:  Aphasia.  Hyperglycemia. EXAM: MRI HEAD WITHOUT CONTRAST MRA HEAD WITHOUT CONTRAST TECHNIQUE: Multiplanar, multiecho pulse sequences of the brain and surrounding structures were obtained without intravenous contrast. Angiographic images of the head were obtained using MRA technique without contrast. COMPARISON:  Limited brain MRI 08/12/2017. Head CT 08/12/2017. Brain MRI/MRA 09/02/2016. FINDINGS: MRI HEAD FINDINGS Brain: No acute infarct, mass, midline shift, or extra-axial fluid collection is identified. Chronic microhemorrhages are again seen in the thalami, right lentiform nucleus, and pons suggesting chronic hypertension as the etiology. Patchy T2 hyperintensities in the cerebral white matter, thalami, and pons were more conspicuous on the 2018 MRI (less motion and higher magnetic field strength) but have likely not significantly changed and are nonspecific but compatible with moderate chronic small vessel ischemic disease. Chronic lacunar infarcts are present in the left corona radiata, posterior left frontal subcortical white matter, and right cerebellum. Mild cerebral  atrophy is within normal limits for age. Vascular: Major intracranial vascular flow voids are preserved. Skull and upper cervical spine: Unremarkable calvarial bone marrow signal. Chronic bone marrow heterogeneity in the visualized cervical spine with underlying disc degeneration. Sinuses/Orbits: Bilateral cataract extraction. Paranasal sinuses and mastoid air cells are clear. Other: None. MRA HEAD FINDINGS The study is moderately motion degraded. The visualized distal vertebral arteries are patent to the basilar with the left being dominant. There is an apparent moderate distal right V4 stenosis which is likely accentuated by motion artifact. PICAs, AICAs, and SCAs are grossly patent though not well evaluated due to motion. The basilar artery is patent without evidence of significant stenosis allowing for prominent motion artifact distally. PCAs are patent without evidence of flow limiting proximal stenosis. There is an improved appearance of the right P1-P2 junction suggesting that the moderate narrowing on the prior MRA was largely artifactual. The internal carotid arteries are patent from skull base to carotid termini with prominent motion artifact through the cavernous segments and no evidence of significant ICA stenosis elsewhere. The MCAs are patent without evidence of significant M1 stenosis or proximal branch occlusion although motion artifact limits branch vessel evaluation. There is rather extensive motion artifact through the ACAs with the right A1 segment being poorly visualized, particularly proximally. No sizable aneurysm is identified. IMPRESSION: 1. No acute intracranial abnormality. 2. Moderate chronic small vessel ischemic disease. 3. Motion degraded MRA. Poorly visualized right A1 segment for which a high-grade proximal stenosis is not excluded. 4. No evidence of high-grade intracranial ICA or M1 stenosis. 5. Moderate distal right V4 stenosis versus artifact. Electronically Signed   By: Logan Bores M.D.   On: 08/13/2017 11:25     CBC Recent Labs  Lab 08/12/17 1131  WBC 11.6*  HGB 12.4  HCT 38.9  PLT 247  MCV 85.5  MCH 27.4  MCHC 32.0  RDW 16.0*  LYMPHSABS 1.6  MONOABS 0.5  EOSABS 0.0  BASOSABS 0.0    Chemistries  Recent Labs  Lab 08/12/17 1131  NA 131*  K 4.3  CL 95*  CO2 28  GLUCOSE 532*  BUN 23*  CREATININE 1.35*  CALCIUM 8.8*  AST 31  ALT 16  ALKPHOS 85  BILITOT 1.3*   ------------------------------------------------------------------------------------------------------------------ estimated creatinine clearance is 40.7 mL/min (A) (by C-G formula based on SCr of 1.35 mg/dL (H)). ------------------------------------------------------------------------------------------------------------------ Recent Labs    08/13/17 0439  HGBA1C 13.1*   ------------------------------------------------------------------------------------------------------------------  Recent Labs    08/13/17 0439  CHOL 147  HDL 46  LDLCALC 83  TRIG 91  CHOLHDL 3.2   ------------------------------------------------------------------------------------------------------------------ No results for input(s): TSH, T4TOTAL, T3FREE, THYROIDAB in the last 72 hours.  Invalid input(s): FREET3 ------------------------------------------------------------------------------------------------------------------ No results for input(s): VITAMINB12, FOLATE, FERRITIN, TIBC, IRON, RETICCTPCT in the last 72 hours.  Coagulation profile Recent Labs  Lab 08/12/17 1131  INR 1.01    No results for input(s): DDIMER in the last 72 hours.  Cardiac Enzymes Recent Labs  Lab 08/12/17 1131  TROPONINI 0.07*   ------------------------------------------------------------------------------------------------------------------ Invalid input(s): POCBNP    Assessment & Plan  Patient is a 77 year old admitted with aphasia   #1 aphasia suspect this is related to seizure MRI of the brain is  negative for stroke Continue Keppra EEG results pending  #2 diabetes type 2 with very poor control Patient will need insulin on discharge Continue Lantus and pre-meal insulin Sugars improved  #3 Acute renal failure with dehydration.    Repeat BMP today  #4 hypertension, continue Hyzaar  #5 hyperlipidemia continue Lipitor           Code Status Orders  (From admission, onward)        Start     Ordered   08/12/17 1728  Do not attempt resuscitation (DNR)  Continuous    Question Answer Comment  In the event of cardiac or respiratory ARREST Do not call a "code blue"   In the event of cardiac or respiratory ARREST Do not perform Intubation, CPR, defibrillation or ACLS   In the event of cardiac or respiratory ARREST Use medication by any route, position, wound care, and other measures to relive pain and suffering. May use oxygen, suction and manual treatment of airway obstruction as needed for comfort.      08/12/17 1727    Code Status History    Date Active Date Inactive Code Status Order ID Comments User Context   09/02/2016 1536 09/05/2016 2126 Full Code [id number]  Radene Gunning, NP ED   02/29/2016 1533 03/02/2016 2108 Full Code [id number]  Samella Parr, NP Inpatient   09/22/2014 1406 09/25/2014 1717 Full Code [id number]  [name], MD Inpatient    Advance Directive Documentation     Most Recent Value  Type of Advance Directive  Living will  Pre-existing out of facility DNR order (yellow form or pink MOST form)  -  "MOST" Form in Place?  -           Consults  70mn  DVT Prophylaxis  Lovenox  Lab Results  Component Value Date   PLT 247 08/12/2017     Time Spent in minutes    Greater than 50% of time spent in care coordination and counseling patient regarding the condition and plan of care.   SDustin FlockM.D on 08/14/2017 at 11:54 AM  Between 7am to 6pm - Pager - [phone]  After 6pm go to [email or web address] - [email or web address]    Office  [phone]

## 2017-08-14 NOTE — Evaluation (Signed)
Occupational Therapy Evaluation Patient Details Name: Kristen [name] MRN: [id number] DOB: 10/06/1940 Today's Date: 08/14/2017    History of Present Illness Patient is a 77 year old female admitted for a CVA following c/o aphasia.  PMH includes vertigo, stroke, OA, DM, PE, Htn, MI, gout and frequent HA's.   Clinical Impression   Pt seen for OT evaluation this date. Prior to hospital admission, pt was independent with ADL, relying on son for transportation, and using a DME intermittently for mobility.  Pt lives her son. Currently pt is sitting up in recliner, speaking more clearly (with brief intermittent periods of mumbling/babbling), and eating applesauce independently. Pt alert and follows all commands. Demonstrates deficits in strength bilaterally UE/LE, impaired activity tolerance, balance, and requires additional assist for mobility and ADL tasks than previously at baseline. Pt/son educated in falls prevention and home/routines modifications in order to maximize safety and independence. Pt would benefit from skilled OT to address noted impairments and functional limitations (see below for any additional details).  Upon hospital discharge, recommend pt discharge to STR to continue rehabilitation.    Follow Up Recommendations  SNF    Equipment Recommendations  None recommended by OT    Recommendations for Other Services       Precautions / Restrictions Precautions Precautions: Fall Restrictions Weight Bearing Restrictions: No      Mobility Bed Mobility           General bed mobility comments: deferred, up in recliner  Transfers        General transfer comment: deferred, pt wanted to remain in recliner    Balance Overall balance assessment: Needs assistance Sitting-balance support: Bilateral upper extremity supported;Feet supported Sitting balance-Leahy Scale: Fair                                   ADL either performed or assessed with clinical  judgement   ADL Overall ADL's : Needs assistance/impaired Eating/Feeding: Sitting;Independent Eating/Feeding Details (indicate cue type and reason): pt able to eat and drink with no assist for set up Grooming: Sitting;Set up   Upper Body Bathing: Sitting;Minimal assistance;Moderate assistance   Lower Body Bathing: Sit to/from stand;Moderate assistance   Upper Body Dressing : Sitting;Moderate assistance;Minimal assistance   Lower Body Dressing: Moderate assistance;Sit to/from stand   Toilet Transfer: Stand-pivot;BSC;RW;Min guard                   Vision Patient Visual Report: No change from baseline Vision Assessment?: No apparent visual deficits     Perception     Praxis      Pertinent Vitals/Pain Pain Assessment: No/denies pain Faces Pain Scale: Hurts a little bit Pain Location: bilateral lateral LL with light touch Pain Descriptors / Indicators: Discomfort Pain Intervention(s): Monitored during session     Hand Dominance Right   Extremity/Trunk Assessment Upper Extremity Assessment Upper Extremity Assessment: Generalized weakness;LUE deficits/detail(RUE grossly 4/5, intact sensation and coordination) LUE Deficits / Details: grossly 4/5 except L shoulder flexion 3+/5 2:2 to arthritis pain   Lower Extremity Assessment Lower Extremity Assessment: Defer to PT evaluation;Generalized weakness   Cervical / Trunk Assessment Cervical / Trunk Assessment: Kyphotic   Communication Communication Communication: Other (comment)(intermittent mumbling/stuttering and speaking clearly)   Cognition Arousal/Alertness: Awake/alert Behavior During Therapy: WFL for tasks assessed/performed Overall Cognitive Status: Within Functional Limits for tasks assessed  General Comments: alert and oriented, son present to confirm as well, follows all commands   General Comments       Exercises Other Exercises Other Exercises: Pt/family  educated in falls prevention and home/routines modifications to maximize safety and independence   Shoulder Instructions      Home Living Family/patient expects to be discharged to:: Private residence Living Arrangements: Children(son) Available Help at Discharge: Family;Available 24 hours/day Type of Home: House Home Access: Stairs to enter [employer] of Steps: 3 Entrance Stairs-Rails: Can reach both Home Layout: One level     Bathroom Shower/Tub: Walk-in shower(newly remodeled approx 2 yrs ago)   Biochemist, clinical: Handicapped height Bathroom Accessibility: Yes   Home Equipment: Environmental consultant - 2 wheels          Prior Functioning/Environment Level of Independence: Needs assistance  Gait / Transfers Assistance Needed: States that she uses a RW for mobility. ADL's / Homemaking Assistance Needed: Gets assist for transportation and groceries, indep with bathing, dressing, med mgt prior to admission   Comments: Pt endorses a couple falls in past couple of weeks (once when she slipped on recently mopped floor at home and notes during session that she needs her son to purchase some better house shoes to improve safety)        OT Problem List: Decreased strength;Decreased activity tolerance;Impaired balance (sitting and/or standing);Decreased knowledge of use of DME or AE      OT Treatment/Interventions: Self-care/ADL training;Balance training;Therapeutic exercise;Therapeutic activities;Energy conservation;DME and/or AE instruction;Patient/family education    OT Goals(Current goals can be found in the care plan section) Acute Rehab OT Goals Patient Stated Goal: To eventually return home. OT Goal Formulation: With patient/family Time For Goal Achievement: 08/28/17 Potential to Achieve Goals: Good ADL Goals Pt Will Perform Lower Body Dressing: with min assist;sit to/from stand;with adaptive equipment Pt Will Transfer to Toilet: with min guard assist;ambulating;regular height  toilet(LRAD for amb)  OT Frequency: Min 1X/week   Barriers to D/C:            Co-evaluation              AM-PAC PT "6 Clicks" Daily Activity     Outcome Measure Help from another person eating meals?: None Help from another person taking care of personal grooming?: None Help from another person toileting, which includes using toliet, bedpan, or urinal?: A Lot Help from another person bathing (including washing, rinsing, drying)?: A Lot Help from another person to put on and taking off regular upper body clothing?: A Little Help from another person to put on and taking off regular lower body clothing?: A Lot 6 Click Score: 17   End of Session    Activity Tolerance: Patient tolerated treatment well Patient left: in chair;with call bell/phone within reach;with chair alarm set  OT Visit Diagnosis: Unsteadiness on feet (R26.81);Muscle weakness (generalized) (M62.81);Cognitive communication deficit (R41.841)                Time: 6644-0347 OT Time Calculation (min): 22 min Charges:  OT General Charges $OT Visit: 1 Visit OT Evaluation $OT Eval Moderate Complexity: 1 Mod OT Treatments $Self Care/Home Management : 8-22 mins  Jeni Salles, MPH, MS, OTR/L ascom [phone] 08/14/17, 2:54 PM

## 2017-08-14 NOTE — Progress Notes (Signed)
Social work Theatre manager presented bed offers to patient and son Kristen [name]. Patient chose [facility]. [name], admission coordinator at [facility], is aware of accepted bed offer and will start [facility] SN[facility] authorization.  Susy Frizzle, Social Work Intern [phone]

## 2017-08-14 NOTE — Evaluation (Signed)
Physical Therapy Evaluation Patient Details Name: Kristen [name] MRN: [id number] DOB: 08-26-1940 Today's Date: 08/14/2017   History of Present Illness  Patient is a 77 year old female admitted for a CVA following c/o aphasia.  PMH includes vertigo, stroke, OA, DM, PE, Htn, MI, gout and frequent HA's.  Clinical Impression  Pt is a 77 year old female who lives in a house with her son.  Pt's is intermittently alert and able to answer questions and then returns to flat affect, making babbling sounds.  She is able to perform bed mobility mod I with encouragement from PT.  Pt reports bilateral LL pain with light touch and states that she felt pain during her bed bath when a washcloth was used on that area.  Pt also presented with pain during ankle MMT and with generalized weakness of UE and LE.  Pt able to stand with min A from bedside and manage RW efficiently.  Pt performed a standing pivot from bed to chair with close CGA, demonstrating short shuffling steps and decreased speed of movement.  Pt dependent on RW for balance.  She will continue to benefit from skilled PT with focus on strength, pain management, balance and tolerance to activity.    Follow Up Recommendations SNF    Equipment Recommendations  (TBD at next venue of care.)    Recommendations for Other Services       Precautions / Restrictions        Mobility  Bed Mobility Overal bed mobility: Modified Independent   Rolling: Modified independent (Device/Increase time)   Supine to sit: Modified independent (Device/Increase time) Sit to supine: Modified independent (Device/Increase time)   General bed mobility comments: Able to perform bed mobility slowly and with use of bed rail.  Transfers Overall transfer level: Needs assistance Equipment used: Rolling walker (2 wheeled) Transfers: Sit to/from [facility] Sit to Stand: Min assist Stand pivot transfers: Min guard       General transfer comment: Pt  able to stand from bed and slowly turn to recliner with min A from PT.  Able to manage RW during turns.  Ambulation/Gait Ambulation/Gait assistance: (Did not perform.)              Stairs            Wheelchair Mobility    Modified Rankin (Stroke Patients Only)       Balance Overall balance assessment: Needs assistance Sitting-balance support: Bilateral upper extremity supported;Feet supported       Standing balance support: Bilateral upper extremity supported   Standing balance comment: Use of RW for balance and close CGA during pivot transfer.                             Pertinent Vitals/Pain Pain Assessment: Faces Faces Pain Scale: Hurts a little bit Pain Location: bilateral lateral LL with light touch Pain Descriptors / Indicators: Discomfort Pain Intervention(s): Monitored during session    Home Living Family/patient expects to be discharged to:: Private residence Living Arrangements: Children(son) Available Help at Discharge: Family;Available 24 hours/day Type of Home: House Home Access: Stairs to enter Entrance Stairs-Rails: Can reach both Entrance Stairs-Number of Steps: 3 Home Layout: One level Home Equipment: Walker - 2 wheels      Prior Function Level of Independence: Needs assistance   Gait / Transfers Assistance Needed: States that she uses a RW for mobility.  Hand Dominance        Extremity/Trunk Assessment   Upper Extremity Assessment Upper Extremity Assessment: Generalized weakness    Lower Extremity Assessment Lower Extremity Assessment: Generalized weakness    Cervical / Trunk Assessment Cervical / Trunk Assessment: Kyphotic  Communication   Communication: No difficulties  Cognition Arousal/Alertness: Awake/alert Behavior During Therapy: WFL for tasks assessed/performed Overall Cognitive Status: No family/caregiver present to determine baseline cognitive functioning                                  General Comments: Pt alert and able answer questions intermittently and then begins to babble.      General Comments      Exercises     Assessment/Plan    PT Assessment Patient needs continued PT services  PT Problem List Decreased strength;Decreased mobility;Decreased activity tolerance;Decreased balance;Decreased cognition       PT Treatment Interventions DME instruction;Therapeutic activities;Cognitive remediation;Gait training;Therapeutic exercise;Patient/family education;Stair training;Balance training;Functional mobility training;Neuromuscular re-education    PT Goals (Current goals can be found in the Care Plan section)  Acute Rehab PT Goals PT Goal Formulation: Patient unable to participate in goal setting    Frequency 7X/week   Barriers to discharge        Co-evaluation               AM-PAC PT "6 Clicks" Daily Activity  Outcome Measure Difficulty turning over in bed (including adjusting bedclothes, sheets and blankets)?: A Little Difficulty moving from lying on back to sitting on the side of the bed? : A Little Difficulty sitting down on and standing up from a chair with arms (e.g., wheelchair, bedside commode, etc,.)?: A Little Help needed moving to and from a bed to chair (including a wheelchair)?: A Little Help needed walking in hospital room?: A Lot Help needed climbing 3-5 steps with a railing? : A Lot 6 Click Score: 16    End of Session Equipment Utilized During Treatment: Gait belt Activity Tolerance: Patient tolerated treatment well;Patient limited by fatigue Patient left: in chair;with call bell/phone within reach;with chair alarm set Nurse Communication: Mobility status PT Visit Diagnosis: Unsteadiness on feet (R26.81);Muscle weakness (generalized) (M62.81)    Time: 5681-2751 PT Time Calculation (min) (ACUTE ONLY): 25 min   Charges:   PT Evaluation $PT Eval Low Complexity: 1 Low     PT G Codes:   PT G-Codes **NOT FOR  INPATIENT CLASS** Functional Assessment Tool Used: AM-PAC 6 Clicks Basic Mobility    Roxanne Gates, PT, DPT   Roxanne Gates 08/14/2017, 11:56 AM

## 2017-08-14 NOTE — Progress Notes (Signed)
Per [facility] admissions coordinator at [facility] SNF authorization has been received. Patient and her son Jaquelyn Bitter are aware of above.    [employer], LCSW [phone]

## 2017-08-14 NOTE — Progress Notes (Addendum)
Speech Language Pathology Treatment: Dysphagia  Patient Details Name: Kristen [name] MRN: [id number] DOB: [birth year] Today's Date: 08/14/2017 Time: 1310-1400 SLP Time Calculation (min) (ACUTE ONLY): 50 min  Assessment / Plan / Recommendation Clinical Impression  Pt seen today for ongoing assessment of toleration of oral diet; trials to upgrade diet if appropriate. Pt continues to present w/ min declined Cognitive status - post seizure. Much improved cognitive-linguistic engagement w/ others, even using humor. She still experiences episodes of dysfluency of speech intermittently, "stuttering" dysfluency w/ no verbal output which resolves given time(1-3 minutes).  NSG/Son reported toleration of the puree diet w/ thin consistency liquids. Pt consumed po trials of minced and cut foods (more mech soft consistency) and trials of thin liquids via cup and Straw w/ no immediate, overt s/s of aspiration; no decline in vocal quality or respiratory status. Pt exhibited no significant oral phase deficits w/ the increased textures - min time needed for full mastication and oral clearing. Pt was assisted in placing her full set of dentures; recommended thorough clearing post meals(supplies left w/ pt/Son). She was alert to complete full mastication and oral clearing w/ more solid trial consistencies. Pt fed self w/ setup assist.   Recommend upgrade of diet to a Dysphagia level 3 w/ thin liquids; aspiration precautions; pills Whole w/ water w/ pt taking herself w/ monitoring, or in Puree w/ NSG. ST services will continue to f/u w/ pt's toleration of diet and education on precautions next 1-2 days. Son/pt agreed. NSG updated.    HPI HPI: Pt is a 77 y.o. female w/ multiple medical dx including GERD, obesity, prior CVA, DM, HTN, syncope, MI who presents via EMS with altered mental status.  Patient is clearly confused quite fidgety grabbing at her gown.  Will intermittently respond with one-word responses.  She is  moving all extremities.  EMS reports the patient reportedly had a fall last night.  Last seen normal was sometime last afternoon or evening according to EMS.  No additional history is provided by patient, family or EMS.  Did report that her blood sugar was greater than 500; does have a history of type 2 diabetes reportedly poorly controlled.  Pt has had 3 seizures in the past hour and a half in the ED and a fourth one when admitted to the floor. MD notified. Patient started on keppra and PRN ativan. Patient has had several episodes where she is almost in a catatonic state with babbling.  Son stated pt has been eating well at home w/ no reported overt s/s of aspiration.  Per Neurology note, Aphasia likely a post-ictal phenomenon.  Patient started on Keppra.  MRI of the brain reviewed and shows no evidence of acute changes.  Seizure likely a consequence of past stroke.       SLP Plan  Continue with current plan of care       Recommendations  Diet recommendations: Dysphagia 3 (mechanical soft);Thin liquid Liquids provided via: Cup;Straw(monitor) Medication Administration: Whole meds with liquid(or whole in Puree for easier swallowing) Supervision: Patient able to self feed;Intermittent supervision to cue for compensatory strategies(tray setup ) Compensations: Minimize environmental distractions;Slow rate;Small sips/bites;Lingual sweep for clearance of pocketing;Multiple dry swallows after each bite/sip;Follow solids with liquid Postural Changes and/or Swallow Maneuvers: Seated upright 90 degrees;Upright 30-60 min after meal                General recommendations: (TBD) Oral Care Recommendations: Oral care BID;Patient independent with oral care;Staff/trained caregiver to provide oral care(denture cleaning) Follow  up Recommendations: None(TBD) SLP Visit Diagnosis: Dysphagia, oropharyngeal phase (R13.12) Plan: Continue with current plan of care       [place],  [place], CCC-SLP Kristen [name],Kristen [name] 08/14/2017, 5:56 PM

## 2017-08-15 LAB — GLUCOSE, CAPILLARY
GLUCOSE-CAPILLARY: 203 mg/dL — AB (ref 65–99)
Glucose-Capillary: 180 mg/dL — ABNORMAL HIGH (ref 65–99)
Glucose-Capillary: 256 mg/dL — ABNORMAL HIGH (ref 65–99)
Glucose-Capillary: 139 mg/dL — ABNORMAL HIGH (ref 65–99)

## 2017-08-15 LAB — CREATININE, SERUM
CREATININE: 0.91 mg/dL (ref 0.44–1.00)
GFR, EST NON AFRICAN AMERICAN: 60 mL/min — AB (ref >60)

## 2017-08-15 LAB — VALPROIC ACID LEVEL: VALPROIC ACID LVL: 54 ug/mL (ref 50.0–100.0)

## 2017-08-15 MED ORDER — VALPROATE SODIUM 250 MG/5ML PO SOLN
250.0000 mg | Freq: Once | ORAL | Status: AC
  Administered 2017-08-15: 250 mg via ORAL
  Filled 2017-08-15: qty 5

## 2017-08-15 MED ORDER — INSULIN ASPART 100 UNIT/ML ~~LOC~~ SOLN
3.0000 [IU] | Freq: Three times a day (TID) | SUBCUTANEOUS | Status: DC
Start: 2017-08-15 — End: 2017-08-18
  Administered 2017-08-15 – 2017-08-18 (×8): 3 [IU] via SUBCUTANEOUS
  Filled 2017-08-15 (×8): qty 1

## 2017-08-15 MED ORDER — INSULIN ASPART 100 UNIT/ML ~~LOC~~ SOLN
0.0000 [IU] | Freq: Three times a day (TID) | SUBCUTANEOUS | Status: DC
  Administered 2017-08-15: 5 [IU] via SUBCUTANEOUS
  Administered 2017-08-16: 3 [IU] via SUBCUTANEOUS
  Administered 2017-08-16: 08:00:00 2 [IU] via SUBCUTANEOUS
  Administered 2017-08-16: 5 [IU] via SUBCUTANEOUS
  Administered 2017-08-17: 2 [IU] via SUBCUTANEOUS
  Administered 2017-08-17: 1 [IU] via SUBCUTANEOUS
  Administered 2017-08-18: 08:00:00 2 [IU] via SUBCUTANEOUS
  Administered 2017-08-18: 3 [IU] via SUBCUTANEOUS
  Filled 2017-08-15 (×8): qty 1

## 2017-08-15 MED ORDER — INSULIN ASPART 100 UNIT/ML ~~LOC~~ SOLN: 0.0000 [IU] | Freq: Every day | SUBCUTANEOUS | Status: DC

## 2017-08-15 MED ORDER — VALPROATE SODIUM 250 MG/5ML PO SOLN
750.0000 mg | Freq: Two times a day (BID) | ORAL | Status: DC
  Administered 2017-08-15 – 2017-08-17 (×4): 750 mg via ORAL
  Filled 2017-08-15 (×5): qty 15

## 2017-08-15 NOTE — Progress Notes (Signed)
Inpatient Diabetes Program Recommendations  AACE/ADA: New Consensus Statement on Inpatient Glycemic Control (2019)  Target Ranges:  Prepandial:   less than 140 mg/dL      Peak postprandial:   less than 180 mg/dL (1-2 hours)      Critically ill patients:  140 - 180 mg/dL  Results for Kristen [name], Kristen [name] (MRN [id number]) as of 08/15/2017 08:37  Ref. Range 08/14/2017 00:05 08/14/2017 07:54 08/14/2017 17:00 08/14/2017 23:12 08/15/2017 07:55  Glucose-Capillary Latest Ref Range: 65 - 99 mg/dL 157 (H) 158 (H) 315 (H) 199 (H) 180 (H)   Results for Kristen [name], Kristen [name] (MRN [id number]) as of 08/13/2017 08:38  Ref. Range 03/12/2017 09:42 08/13/2017 04:39  Hemoglobin A1C Latest Ref Range: 4.8 - 5.6 % 6.6 (H) 13.1 (H)   Review of Glycemic Control  Diabetes history: DM2 Outpatient Diabetes medications: Amaryl 4 mg QAM, Actos 30 mg daily, Duetact 30-2 mg QAM (combination of Amaryl and Actos); per home med list pt is not taking Actos or Duetact Current orders for Inpatient glycemic control: Lantus 15 units QHS, Novolog 0-9 units TID with meals  Inpatient Diabetes Program Recommendations:  Correction (SSI): Please consider ordering Novolog 0-5 units QHS for bedtime correction. Insulin-Meal Coverage: Please consider ordering Novolog 3 units TID with meals for meal coverage if patient eats at least 50% of meals. HgbA1C: A1C 13.1% on 08/13/17 indicating an average glucose of 329 mg/dl over the past 2-3 months. Per MD note, patient will be discharged on insulin. Patient and family prefer insulin pen(s) and have requested prescription for [employer] Reader and Sensors (2).  Thanks, Barnie Alderman, RN, MSN, CDE Diabetes Coordinator  Inpatient Diabetes Program [phone] (Team Pager from 8am to 5pm)

## 2017-08-15 NOTE — Progress Notes (Signed)
PT Cancellation Note  Patient Details Name: Kristen [name] MRN: [id number] DOB: 07-03-1940   Cancelled Treatment:    Reason Eval/Treat Not Completed: Patient declined, no reason specified   Pt in bed.  Refused therapy today despite encouragement and education.  Family in room and also encouraged pt but she continued to decline.     Chesley Noon 08/15/2017, 1:29 PM

## 2017-08-15 NOTE — Progress Notes (Signed)
OT Cancellation Note  Patient Details Name: Kristen [name] MRN: [id number] DOB: 1940-12-06   Cancelled Treatment:    Reason Eval/Treat Not Completed: Patient declined, no reason specified. Upon attempt, pt in bed and son in room. Pleasant, but politely and adamantly declining OT treatment despite encouragement. Will re-attempt next date.  Jeni Salles, MPH, MS, OTR/L ascom [phone] 08/15/17, 3:59 PM

## 2017-08-15 NOTE — Care Management Important Message (Signed)
Important Message  Patient Details  Name: Kristen [name] MRN: [id number] Date of Birth: 1940/07/12   Medicare Important Message Given:  Yes    [name] A [name] 08/15/2017, 10:50 AM

## 2017-08-15 NOTE — Progress Notes (Signed)
[place] at [facility]                                                                                                                                                                                  Patient Demographics   Kristen [name], is a 77 y.o. female, DOB - [birth year], WYO:[id number]  Admit date - 08/12/2017   Admitting Physician Demetrios Loll, MD  Outpatient Primary MD for the patient is [name], MD   LOS - 3  Subjective: Patient continues to have facial twitching as well as episodes of expressive aphasia   Review of Systems:   CONSTITUTIONAL: Limited due to her current condition  Vitals:   Vitals:   08/14/17 2320 08/15/17 0101 08/15/17 0345 08/15/17 1053  BP: (!) 183/67 (!) 133/57 (!) 162/71 (!) 148/64  Pulse: (!) 58 62 (!) 57 63  Resp:  18 20 18   Temp:  98.1 F (36.7 C) (!) 97.5 F (36.4 C)   TempSrc:  Oral Oral   SpO2:  99% 99%   Weight:      Height:        Wt Readings from Last 3 Encounters:  08/12/17 99.8 kg (220 lb)  03/12/17 96.6 kg (213 lb)  12/01/16 96.2 kg (212 lb)     Intake/Output Summary (Last 24 hours) at 08/15/2017 1258 Last data filed at 08/15/2017 0900 Gross per 24 hour  Intake 600 ml  Output -  Net 600 ml    Physical Exam:   GENERAL: Pleasant-appearing in no apparent distress.  HEAD, EYES, EARS, NOSE AND THROAT: Atraumatic, normocephalic. Extraocular muscles are intact. Pupils equal and reactive to light. Sclerae anicteric. No conjunctival injection. No oro-pharyngeal erythema.  NECK: Supple. There is no jugular venous distention. No bruits, no lymphadenopathy, no thyromegaly.  HEART: Regular rate and rhythm,. No murmurs, no rubs, no clicks.  LUNGS: Clear to auscultation bilaterally. No rales or rhonchi. No wheezes.  ABDOMEN: Soft, flat, nontender, nondistended. Has good bowel sounds. No hepatosplenomegaly appreciated.  EXTREMITIES: No evidence of any cyanosis, clubbing, or peripheral edema.  +2 pedal  and radial pulses bilaterally.  NEUROLOGIC: Awake able to follow commands SKIN: Moist and warm with no rashes appreciated.  Psych: Not anxious, depressed LN: No inguinal LN enlargement    Antibiotics   Anti-infectives (From admission, onward)   None      Medications   Scheduled Meds: . aspirin  300 mg Rectal Daily   Or  . aspirin  325 mg Oral Daily  . atorvastatin  40 mg Oral q1800  . clopidogrel  75 mg Oral Daily  . enoxaparin (LOVENOX) injection  40 mg Subcutaneous Q24H  . hydrALAZINE  50 mg Oral Q8H  . hydrochlorothiazide  12.5 mg Oral Daily  . insulin aspart  0-9 Units Subcutaneous TID WC  . insulin glargine  15 Units Subcutaneous QHS  . losartan  100 mg Oral Daily  . Valproate Sodium  750 mg Oral BID   Continuous Infusions:  PRN Meds:.bisacodyl, LORazepam, nitroGLYCERIN, senna-docusate   Data Review:   Micro Results No results found for this or any previous visit (from the past 240 hour(s)).  Radiology Reports Dg Chest 2 View  Result Date: 08/12/2017 CLINICAL DATA:  Patient poor historian at this time. Per ED RN notes: Pt arrived via EMS from home with reports of aphasia that started at an unknown time. Son states pt fell last evening and was at her house from about 8pm until 9pm. EXAM: CHEST - 2 VIEW COMPARISON:  02/29/2016 FINDINGS: Heart size is accentuated by the AP technique. There are no focal consolidations or pleural effusions. No pulmonary edema. There is midthoracic spondylosis. IMPRESSION: No active cardiopulmonary disease. Electronically Signed   By: Nolon Nations M.D.   On: 08/12/2017 13:16   Ct Head Wo Contrast  Result Date: 08/12/2017 CLINICAL DATA:  Aphasia.  Fell last night. EXAM: CT HEAD WITHOUT CONTRAST TECHNIQUE: Contiguous axial images were obtained from the base of the skull through the vertex without intravenous contrast. COMPARISON:  Brain MR dated 09/02/2016 and head CT dated 09/02/2016. FINDINGS: Brain: Diffusely enlarged ventricles and  subarachnoid spaces. Patchy white matter low density in both cerebral hemispheres. No intracranial hemorrhage, mass lesion or CT evidence of acute infarction. Vascular: No hyperdense vessel or unexpected calcification. Skull: Normal. Negative for fracture or focal lesion. Sinuses/Orbits: Unremarkable. Other: None. IMPRESSION: 1. No acute abnormality. 2. Mild diffuse cerebral and cerebellar atrophy and mild chronic small vessel white matter ischemic changes in both cerebral hemispheres. Electronically Signed   By: Claudie Revering M.D.   On: 08/12/2017 12:12   Mr Brain Wo Contrast  Result Date: 08/13/2017 CLINICAL DATA:  Aphasia.  Hyperglycemia. EXAM: MRI HEAD WITHOUT CONTRAST MRA HEAD WITHOUT CONTRAST TECHNIQUE: Multiplanar, multiecho pulse sequences of the brain and surrounding structures were obtained without intravenous contrast. Angiographic images of the head were obtained using MRA technique without contrast. COMPARISON:  Limited brain MRI 08/12/2017. Head CT 08/12/2017. Brain MRI/MRA 09/02/2016. FINDINGS: MRI HEAD FINDINGS Brain: No acute infarct, mass, midline shift, or extra-axial fluid collection is identified. Chronic microhemorrhages are again seen in the thalami, right lentiform nucleus, and pons suggesting chronic hypertension as the etiology. Patchy T2 hyperintensities in the cerebral white matter, thalami, and pons were more conspicuous on the 2018 MRI (less motion and higher magnetic field strength) but have likely not significantly changed and are nonspecific but compatible with moderate chronic small vessel ischemic disease. Chronic lacunar infarcts are present in the left corona radiata, posterior left frontal subcortical white matter, and right cerebellum. Mild cerebral atrophy is within normal limits for age. Vascular: Major intracranial vascular flow voids are preserved. Skull and upper cervical spine: Unremarkable calvarial bone marrow signal. Chronic bone marrow heterogeneity in the  visualized cervical spine with underlying disc degeneration. Sinuses/Orbits: Bilateral cataract extraction. Paranasal sinuses and mastoid air cells are clear. Other: None. MRA HEAD FINDINGS The study is moderately motion degraded. The visualized distal vertebral arteries are patent to the basilar with the left being dominant. There is an apparent moderate distal right V4 stenosis which is likely accentuated by motion artifact. PICAs, AICAs, and SCAs are grossly patent though not well evaluated due to motion. The basilar  artery is patent without evidence of significant stenosis allowing for prominent motion artifact distally. PCAs are patent without evidence of flow limiting proximal stenosis. There is an improved appearance of the right P1-P2 junction suggesting that the moderate narrowing on the prior MRA was largely artifactual. The internal carotid arteries are patent from skull base to carotid termini with prominent motion artifact through the cavernous segments and no evidence of significant ICA stenosis elsewhere. The MCAs are patent without evidence of significant M1 stenosis or proximal branch occlusion although motion artifact limits branch vessel evaluation. There is rather extensive motion artifact through the ACAs with the right A1 segment being poorly visualized, particularly proximally. No sizable aneurysm is identified. IMPRESSION: 1. No acute intracranial abnormality. 2. Moderate chronic small vessel ischemic disease. 3. Motion degraded MRA. Poorly visualized right A1 segment for which a high-grade proximal stenosis is not excluded. 4. No evidence of high-grade intracranial ICA or M1 stenosis. 5. Moderate distal right V4 stenosis versus artifact. Electronically Signed   By: Logan Bores M.D.   On: 08/13/2017 11:25   Mr Brain Wo Contrast  Result Date: 08/12/2017 CLINICAL DATA:  Altered mental status. Fell last night. Hyperglycemia. History of poorly controlled diabetes, stroke, hypertension,  hyperlipidemia. EXAM: MRI HEAD WITHOUT CONTRAST TECHNIQUE: Sagittal T1, axial and coronal diffusion weighted imaging, axial T2 sequences obtained. Patient terminated the examination by climbing out of the scanner. COMPARISON:  CT HEAD August 12, 2016 and MRI of the head Sep 02, 2016 FINDINGS: Moderately motion degraded examination. INTRACRANIAL CONTENTS: No reduced diffusion to suggest acute ischemia, hypercellular tumor or infection. Limited assessment for blood products, no lobar hematoma. Old LEFT basal ganglia versus corona radiata infarct. Old small cerebellar infarcts. No hydrocephalus. Probable moderate chronic small vessel ischemic disease. No midline shift or mass effect. No abnormal extra-axial fluid collections. VASCULAR: Normal major intracranial vascular flow voids present at skull base. SKULL AND UPPER CERVICAL SPINE: No abnormal sellar expansion. No suspicious calvarial bone marrow signal. Craniocervical junction maintained. SINUSES/ORBITS: The mastoid air-cells and included paranasal sinuses are well-aerated.The included ocular globes and orbital contents are non-suspicious. Bilateral ocular lens implants. OTHER: None. IMPRESSION: 1. Limited 4 sequence MRI of the head, degraded by motion. No acute intracranial process. Electronically Signed   By: Elon Alas M.D.   On: 08/12/2017 15:41   US Carotid Bilateral (at [facility] And Ap Only)  Result Date: 08/13/2017 CLINICAL DATA:  Stroke EXAM: BILATERAL CAROTID DUPLEX ULTRASOUND TECHNIQUE: Pearline Cables scale imaging, color Doppler and duplex ultrasound were performed of bilateral carotid and vertebral arteries in the neck. COMPARISON:  None. FINDINGS: Criteria: Quantification of carotid stenosis is based on velocity parameters that correlate the residual internal carotid diameter with NASCET-based stenosis levels, using the diameter of the distal internal carotid lumen as the denominator for stenosis measurement. The following velocity measurements were  obtained: RIGHT ICA:  81 cm/sec CCA:  90 cm/sec SYSTOLIC ICA/CCA RATIO:  0.9 DIASTOLIC ICA/CCA RATIO: ECA:  169 cm/sec LEFT ICA:  55 cm/sec CCA:  878 cm/sec SYSTOLIC ICA/CCA RATIO:  0.5 DIASTOLIC ICA/CCA RATIO: ECA:  89 cm/sec RIGHT CAROTID ARTERY: Moderate calcified plaque in the bulb. Low resistance internal carotid Doppler pattern. RIGHT VERTEBRAL ARTERY:  Antegrade. LEFT CAROTID ARTERY: Mild smooth mixed plaque in the bulb. Low resistance internal carotid Doppler pattern. LEFT VERTEBRAL ARTERY:  Antegrade. IMPRESSION: Less than 50% stenosis in the right and left internal carotid arteries. Electronically Signed   By: Marybelle Killings M.D.   On: 08/13/2017 09:58   Mr Jodene Nam Head/brain  Wo Cm  Result Date: 08/13/2017 CLINICAL DATA:  Aphasia.  Hyperglycemia. EXAM: MRI HEAD WITHOUT CONTRAST MRA HEAD WITHOUT CONTRAST TECHNIQUE: Multiplanar, multiecho pulse sequences of the brain and surrounding structures were obtained without intravenous contrast. Angiographic images of the head were obtained using MRA technique without contrast. COMPARISON:  Limited brain MRI 08/12/2017. Head CT 08/12/2017. Brain MRI/MRA 09/02/2016. FINDINGS: MRI HEAD FINDINGS Brain: No acute infarct, mass, midline shift, or extra-axial fluid collection is identified. Chronic microhemorrhages are again seen in the thalami, right lentiform nucleus, and pons suggesting chronic hypertension as the etiology. Patchy T2 hyperintensities in the cerebral white matter, thalami, and pons were more conspicuous on the 2018 MRI (less motion and higher magnetic field strength) but have likely not significantly changed and are nonspecific but compatible with moderate chronic small vessel ischemic disease. Chronic lacunar infarcts are present in the left corona radiata, posterior left frontal subcortical white matter, and right cerebellum. Mild cerebral atrophy is within normal limits for age. Vascular: Major intracranial vascular flow voids are preserved. Skull and  upper cervical spine: Unremarkable calvarial bone marrow signal. Chronic bone marrow heterogeneity in the visualized cervical spine with underlying disc degeneration. Sinuses/Orbits: Bilateral cataract extraction. Paranasal sinuses and mastoid air cells are clear. Other: None. MRA HEAD FINDINGS The study is moderately motion degraded. The visualized distal vertebral arteries are patent to the basilar with the left being dominant. There is an apparent moderate distal right V4 stenosis which is likely accentuated by motion artifact. PICAs, AICAs, and SCAs are grossly patent though not well evaluated due to motion. The basilar artery is patent without evidence of significant stenosis allowing for prominent motion artifact distally. PCAs are patent without evidence of flow limiting proximal stenosis. There is an improved appearance of the right P1-P2 junction suggesting that the moderate narrowing on the prior MRA was largely artifactual. The internal carotid arteries are patent from skull base to carotid termini with prominent motion artifact through the cavernous segments and no evidence of significant ICA stenosis elsewhere. The MCAs are patent without evidence of significant M1 stenosis or proximal branch occlusion although motion artifact limits branch vessel evaluation. There is rather extensive motion artifact through the ACAs with the right A1 segment being poorly visualized, particularly proximally. No sizable aneurysm is identified. IMPRESSION: 1. No acute intracranial abnormality. 2. Moderate chronic small vessel ischemic disease. 3. Motion degraded MRA. Poorly visualized right A1 segment for which a high-grade proximal stenosis is not excluded. 4. No evidence of high-grade intracranial ICA or M1 stenosis. 5. Moderate distal right V4 stenosis versus artifact. Electronically Signed   By: Logan Bores M.D.   On: 08/13/2017 11:25     CBC Recent Labs  Lab 08/12/17 1131  WBC 11.6*  HGB 12.4  HCT 38.9   PLT 247  MCV 85.5  MCH 27.4  MCHC 32.0  RDW 16.0*  LYMPHSABS 1.6  MONOABS 0.5  EOSABS 0.0  BASOSABS 0.0    Chemistries  Recent Labs  Lab 08/12/17 1131 08/15/17 0501  NA 131*  --   K 4.3  --   CL 95*  --   CO2 28  --   GLUCOSE 532*  --   BUN 23*  --   CREATININE 1.35* 0.91  CALCIUM 8.8*  --   AST 31  --   ALT 16  --   ALKPHOS 85  --   BILITOT 1.3*  --    ------------------------------------------------------------------------------------------------------------------ estimated creatinine clearance is 60.4 mL/min (by C-G formula based on SCr of 0.91  mg/dL). ------------------------------------------------------------------------------------------------------------------ Recent Labs    08/13/17 0439  HGBA1C 13.1*   ------------------------------------------------------------------------------------------------------------------ Recent Labs    08/13/17 0439  CHOL 147  HDL 46  LDLCALC 83  TRIG 91  CHOLHDL 3.2   ------------------------------------------------------------------------------------------------------------------ No results for input(s): TSH, T4TOTAL, T3FREE, THYROIDAB in the last 72 hours.  Invalid input(s): FREET3 ------------------------------------------------------------------------------------------------------------------ No results for input(s): VITAMINB12, FOLATE, FERRITIN, TIBC, IRON, RETICCTPCT in the last 72 hours.  Coagulation profile Recent Labs  Lab 08/12/17 1131  INR 1.01    No results for input(s): DDIMER in the last 72 hours.  Cardiac Enzymes Recent Labs  Lab 08/12/17 1131  TROPONINI 0.07*   ------------------------------------------------------------------------------------------------------------------ Invalid input(s): POCBNP    Assessment & Plan  Patient is a 77 year old admitted with aphasia   #1 aphasia suspect this is related to seizure MRI of the brain is negative for stroke Neurology has started patient  on Depakote, doses are being adjusted EEG reviewed by neurology  #2 diabetes type 2 with very poor control Patient will need insulin on discharge Continue Lantus and pre-meal insulin Blood sugar stable  #3 Acute renal failure with dehydration.    Repeat BMP in the morning  #4 hypertension, continue Hyzaar  #5 hyperlipidemia continue Lipitor           Code Status Orders  (From admission, onward)        Start     Ordered   08/12/17 1728  Do not attempt resuscitation (DNR)  Continuous    Question Answer Comment  In the event of cardiac or respiratory ARREST Do not call a "code blue"   In the event of cardiac or respiratory ARREST Do not perform Intubation, CPR, defibrillation or ACLS   In the event of cardiac or respiratory ARREST Use medication by any route, position, wound care, and other measures to relive pain and suffering. May use oxygen, suction and manual treatment of airway obstruction as needed for comfort.      08/12/17 1727    Code Status History    Date Active Date Inactive Code Status Order ID Comments User Context   09/02/2016 1536 09/05/2016 2126 Full Code [id number]  Radene Gunning, NP ED   02/29/2016 1533 03/02/2016 2108 Full Code [id number]  Samella Parr, NP Inpatient   09/22/2014 1406 09/25/2014 1717 Full Code [id number]  [name], MD Inpatient    Advance Directive Documentation     Most Recent Value  Type of Advance Directive  Living will  Pre-existing out of facility DNR order (yellow form or pink MOST form)  -  "MOST" Form in Place?  -           Consults  24min  DVT Prophylaxis  Lovenox  Lab Results  Component Value Date   PLT 247 08/12/2017     Time Spent in minutes    Greater than 50% of time spent in care coordination and counseling patient regarding the condition and plan of care.   Dustin Flock M.D on 08/15/2017 at 12:58 PM  Between 7am to 6pm - Pager - [phone]  After 6pm go to [email or web address] - [employer]  [employer]   Office  [phone]

## 2017-08-15 NOTE — Progress Notes (Signed)
Subjective: Patient has had multiple episodes of facial twitching today but currently awake and alert.    Objective: Current vital signs: BP (!) 148/64 (BP Location: Right Arm)   Pulse 63   Temp (!) 97.5 F (36.4 C) (Oral)   Resp 18   Ht 5\' 4"  (1.626 m)   Wt 99.8 kg (220 lb)   SpO2 99%   BMI 37.76 kg/m  Vital signs in last 24 hours: Temp:  [97.5 F (36.4 C)-98.9 F (37.2 C)] 97.5 F (36.4 C) (04/24 0345) Pulse Rate:  [57-71] 63 (04/24 1053) Resp:  [16-20] 18 (04/24 1053) BP: (117-190)/(57-97) 148/64 (04/24 1053) SpO2:  [98 %-100 %] 99 % (04/24 0345)  Intake/Output from previous day: 04/23 0701 - 04/24 0700 In: 360 [P.O.:360] Out: -  Intake/Output this shift: Total I/O In: 240 [P.O.:240] Out: -  Nutritional status: Fall precautions Aspiration precautions Seizure precautions Aspiration precautions Fall precautions DIET DYS 3 Room service appropriate? Yes with Assist; Fluid consistency: Thin  Neurologic Exam: Mental Status: Alert and awake.  Speech fluent without evidence of aphasia.  Able to follow commands without difficulty. Cranial Nerves: II: Discs flat bilaterally; Visual fields grossly normal, pupils equal, round, reactive to light and accommodation III,IV, VI: ptosis not present, extra-ocular motions intact bilaterally V,VII: smile symmetric, facial light touch sensation normal bilaterally VIII: hearing normal bilaterally IX,X: gag reflex present XI: bilateral shoulder shrug XII: midline tongue extension Motor: Able to lift all extremities against gravity    Lab Results: Basic Metabolic Panel: Recent Labs  Lab 08/12/17 1131 08/15/17 0501  NA 131*  --   K 4.3  --   CL 95*  --   CO2 28  --   GLUCOSE 532*  --   BUN 23*  --   CREATININE 1.35* 0.91  CALCIUM 8.8*  --     Liver Function Tests: Recent Labs  Lab 08/12/17 1131  AST 31  ALT 16  ALKPHOS 85  BILITOT 1.3*  PROT 7.1  ALBUMIN 3.7   No results for input(s): LIPASE, AMYLASE in the  last 168 hours. No results for input(s): AMMONIA in the last 168 hours.  CBC: Recent Labs  Lab 08/12/17 1131  WBC 11.6*  NEUTROABS 9.4*  HGB 12.4  HCT 38.9  MCV 85.5  PLT 247    Cardiac Enzymes: Recent Labs  Lab 08/12/17 1131  TROPONINI 0.07*    Lipid Panel: Recent Labs  Lab 08/13/17 0439  CHOL 147  TRIG 91  HDL 46  CHOLHDL 3.2  VLDL 18  LDLCALC 83    CBG: Recent Labs  Lab 08/14/17 0754 08/14/17 1700 08/14/17 2312 08/15/17 0755 08/15/17 1156  GLUCAP 158* 315* 199* 180* 53*    Microbiology: Results for orders placed or performed during the hospital encounter of 09/10/14  Surgical pcr screen     Status: None   Collection Time: 09/10/14 12:02 PM  Result Value Ref Range Status   MRSA, PCR NEGATIVE NEGATIVE Final   Staphylococcus aureus NEGATIVE NEGATIVE Final    Comment:        The Xpert SA Assay (FDA approved for NASAL specimens in patients over 81 years of age), is one component of a comprehensive surveillance program.  Test performance has been validated by [facility] for patients greater than or equal to 39 year old. It is not intended to diagnose infection nor to guide or monitor treatment.     Coagulation Studies: No results for input(s): LABPROT, INR in the last 72 hours.  Imaging:  No results found.  Medications:  I have reviewed the patient's current medications. Scheduled: . aspirin  300 mg Rectal Daily   Or  . aspirin  325 mg Oral Daily  . atorvastatin  40 mg Oral q1800  . clopidogrel  75 mg Oral Daily  . enoxaparin (LOVENOX) injection  40 mg Subcutaneous Q24H  . hydrALAZINE  50 mg Oral Q8H  . hydrochlorothiazide  12.5 mg Oral Daily  . insulin aspart  0-9 Units Subcutaneous TID WC  . insulin glargine  15 Units Subcutaneous QHS  . losartan  100 mg Oral Daily  . Valproate Sodium  750 mg Oral BID    Assessment/Plan: Patient with improved mental status.  Continues to have focal seizure activity.  On Depakote which was  started yesterday.  Depakote level 54.    Recommendations: 1. Depakote 250mg  now 2. Increase maintenance Depakote to 750mg  BID 3. Depakote level in AM 4. Continue seizure precautions    LOS: 3 days   Alexis Goodell, MD Neurology [phone] 08/15/2017  12:15 PM

## 2017-08-15 NOTE — Progress Notes (Signed)
Pt with 4 seizure like episodes in the past hour and a half. Dr. Doy Mince notified. Extra dose of depakene given per order. Will continue to monitor. Ammie Dalton, RN

## 2017-08-16 LAB — BASIC METABOLIC PANEL
ANION GAP: 8 (ref 5–15)
BUN: 16 mg/dL (ref 6–20)
CALCIUM: 8.7 mg/dL — AB (ref 8.9–10.3)
CHLORIDE: 101 mmol/L (ref 101–111)
CO2: 26 mmol/L (ref 22–32)
Creatinine, Ser: 0.89 mg/dL (ref 0.44–1.00)
GFR calc non Af Amer: >60 mL/min (ref >60)
Glucose, Bld: 210 mg/dL — ABNORMAL HIGH (ref 65–99)
Potassium: 3.4 mmol/L — ABNORMAL LOW (ref 3.5–5.1)
Sodium: 135 mmol/L (ref 135–145)

## 2017-08-16 LAB — GLUCOSE, CAPILLARY
GLUCOSE-CAPILLARY: 127 mg/dL — AB (ref 65–99)
Glucose-Capillary: 178 mg/dL — ABNORMAL HIGH (ref 65–99)
Glucose-Capillary: 265 mg/dL — ABNORMAL HIGH (ref 65–99)
Glucose-Capillary: 223 mg/dL — ABNORMAL HIGH (ref 65–99)

## 2017-08-16 LAB — VALPROIC ACID LEVEL: Valproic Acid Lvl: 97 ug/mL (ref 50.0–100.0)

## 2017-08-16 MED ORDER — VALPROATE SODIUM 250 MG/5ML PO SOLN: 750.0000 mg | Freq: Two times a day (BID) | ORAL | Status: DC

## 2017-08-16 MED ORDER — HYDRALAZINE HCL 50 MG PO TABS: 50.0000 mg | ORAL_TABLET | Freq: Three times a day (TID) | ORAL | Status: DC

## 2017-08-16 MED ORDER — INSULIN GLARGINE 100 UNIT/ML ~~LOC~~ SOLN: 15.0000 [IU] | Freq: Every day | SUBCUTANEOUS | 11 refills | Status: DC

## 2017-08-16 MED ORDER — POTASSIUM CHLORIDE CRYS ER 20 MEQ PO TBCR
40.0000 meq | EXTENDED_RELEASE_TABLET | Freq: Once | ORAL | Status: AC
  Administered 2017-08-16: 40 meq via ORAL
  Filled 2017-08-16: qty 2

## 2017-08-16 MED ORDER — SODIUM CHLORIDE 0.9 % IV SOLN
100.0000 mg | Freq: Once | INTRAVENOUS | Status: AC
  Administered 2017-08-16: 11:00:00 100 mg via INTRAVENOUS
  Filled 2017-08-16: qty 10

## 2017-08-16 MED ORDER — INSULIN GLARGINE 100 UNIT/ML ~~LOC~~ SOLN
20.0000 [IU] | Freq: Every day | SUBCUTANEOUS | Status: DC
  Administered 2017-08-16 – 2017-08-17 (×2): 20 [IU] via SUBCUTANEOUS
  Filled 2017-08-16 (×3): qty 0.2

## 2017-08-16 MED ORDER — ATORVASTATIN CALCIUM 40 MG PO TABS: 40.0000 mg | ORAL_TABLET | Freq: Every day | ORAL | Status: DC

## 2017-08-16 MED ORDER — LACOSAMIDE 50 MG PO TABS
50.0000 mg | ORAL_TABLET | Freq: Two times a day (BID) | ORAL | Status: DC
  Administered 2017-08-16 – 2017-08-17 (×2): 50 mg via ORAL
  Filled 2017-08-16 (×2): qty 1

## 2017-08-16 MED ORDER — INSULIN ASPART 100 UNIT/ML ~~LOC~~ SOLN: 3.0000 [IU] | Freq: Three times a day (TID) | SUBCUTANEOUS | 11 refills | Status: DC

## 2017-08-16 NOTE — Progress Notes (Signed)
Patient had 4 seizures within 20 min, MD aware, Ativan given  Kyla Balzarine, LPN

## 2017-08-16 NOTE — Discharge Instructions (Signed)
Heart healthy and ADA dysphagia 3 diet. Aspiration precaution, fall precaution and the seizure precaution.

## 2017-08-16 NOTE — Progress Notes (Signed)
Speech Language Pathology Treatment: Dysphagia  Patient Details Name: KALAN RINN MRN: [id number] DOB: 12-14-1940 Today's Date: 08/16/2017 Time: 0930-1000 SLP Time Calculation (min) (ACUTE ONLY): 30 min  Assessment / Plan / Recommendation Clinical Impression  Pt seen today for ongoing assessment of toleration of oral diet; diet upgraded to mech soft w/ thin liquids this week. Pt has been tolerating the diet consistency well per NSG report. She continues to present w/ min declined Cognitive status - post seizures. Much improved cognitive-linguistic engagement w/ others at sentence level, even using humor. Pt still experiences episodes of focal seizure activity per MD - episodes of pursed lips(facial) and dysfluency of speech, a "stuttering" dysfluency, w/ no verbal output until it resolves w/ brief time(1-2 minutes).  Pt consumed po trials of mech soft foods (cut) and trials of thin liquids via cup and Straw w/ no immediate, overt s/s of aspiration; no decline in vocal quality or respiratory status. Pt exhibited no significant oral phase deficits w/ the increased textures - min time needed for full mastication and oral clearing but may have been impacted by her distraction as well. Pt was alert to verbally converse and feed self her meal w/ adequate oral clearing but seemed to become too distracted and not fully attend to oral clearing when attempting to talk on the phone at the same time. Encouraged pt w/ verbal/tactile cues to work on 1 task at a time. Education provided to family member on this as well via phone.    Recommend continue a Dysphagia level 3 w/ thin liquids; aspiration precautions; pills Whole w/ water w/ pt taking herself w/ monitoring, or in Puree w/ NSG for safer intake. Recommend reducing distractions at meals, less talking, and cues to redirect pt to attend to her meal and follow precautions. No further ST services indicated for swallowing, HOWEVER, ST services are recommended for  a FULL COGNITIVE assessment at Discharge to Rehab d/t pt's Cognitive presentation currently - unsure of pt's Cognitive status post seizures as she appears to require increased time for processing of information and verbal cues for follow through; verbal reminders and direction as well. Pt was living at home alone prior. Safety concerns at this time; pt may need more Supervision if returning home alone. NSG updated.     HPI HPI: Pt is a 77 y.o. female w/ multiple medical dx including GERD, obesity, prior CVA, DM, HTN, syncope, MI who presents via EMS with altered mental status.  Patient is clearly confused quite fidgety grabbing at her gown.  Will intermittently respond with one-word responses.  She is moving all extremities.  EMS reports the patient reportedly had a fall last night.  Last seen normal was sometime last afternoon or evening according to EMS.  No additional history is provided by patient, family or EMS.  Did report that her blood sugar was greater than 500; does have a history of type 2 diabetes reportedly poorly controlled.  Pt has had 3 seizures in the past hour and a half in the ED and a fourth one when admitted to the floor. MD notified. Patient started on keppra and PRN ativan. Patient has had several episodes where she is almost in a catatonic state with babbling.  Son stated pt has been eating well at home w/ no reported overt s/s of aspiration.  Per Neurology note, Aphasia likely a post-ictal phenomenon.  Patient started on Keppra.  MRI of the brain reviewed and shows no evidence of acute changes.  Seizure likely a consequence  of past stroke. Pt continues to have focal seizure activity w/ pursed lips(facial) and dysfluency if speech for ~1 min then returns to her baseline - these are intermittent. Neurology following.       SLP Plan  All goals met       Recommendations  Diet recommendations: Dysphagia 3 (mechanical soft);Thin liquid Liquids provided via: Cup;Straw(monitor  ) Medication Administration: Whole meds with puree(if needed for safer swallowing and intake overall) Supervision: Patient able to self feed;Intermittent supervision to cue for compensatory strategies(setup assist) Compensations: Minimize environmental distractions;Slow rate;Small sips/bites;Lingual sweep for clearance of pocketing;Multiple dry swallows after each bite/sip;Follow solids with liquid Postural Changes and/or Swallow Maneuvers: Seated upright 90 degrees;Upright 30-60 min after meal                General recommendations: (TBD) Oral Care Recommendations: Oral care BID;Staff/trained caregiver to provide oral care;Patient independent with oral care Follow up Recommendations: None(for swallowing) SLP Visit Diagnosis: Dysphagia, unspecified (R13.10) Plan: All goals met       GO                Orinda Kenner, MS, CCC-SLP , 08/16/2017, 12:01 PM

## 2017-08-16 NOTE — Progress Notes (Signed)
PT Cancellation Note  Patient Details Name: Kristen [name] MRN: [id number] DOB: 1941/01/12   Cancelled Treatment:    Reason Eval/Treat Not Completed: Fatigue/lethargy limiting ability to participate.  Per RN pt has received ativan and a narcotic and is too lethargic to participate with PT at this time.  Will attempt to see pt again next date (4/26).    Collie Siad PT, DPT 08/16/2017, 1:51 PM

## 2017-08-16 NOTE — Progress Notes (Signed)
Patient was to D/C to [facility] today however MD cancelled D/C order. [facility] admissions coordinator at [facility] is aware of above.   [employer], LCSW [phone]

## 2017-08-16 NOTE — Progress Notes (Signed)
[place] at [facility]                                                                                                                                                                                  Patient Demographics   Kristen [name], is a 77 y.o. female, DOB - 03/16/1941, JEH:[id number]  Admit date - 08/12/2017   Admitting Physician Demetrios Loll, MD  Outpatient Primary MD for the patient is [name], MD   LOS - 4  Subjective: Patient continues to have facial twitching as well as episodes of expressive aphasia last night and this morning.   Review of Systems:   CONSTITUTIONAL: Limited due to her current condition  Vitals:   Vitals:   08/15/17 1053 08/15/17 2032 08/16/17 0606 08/16/17 0804  BP: (!) 148/64 (!) 186/76 (!) 149/82 (!) 148/66  Pulse: 63 64 64 61  Resp: 18 18 18 18   Temp:  98.6 F (37 C) 98.7 F (37.1 C) 98.2 F (36.8 C)  TempSrc:  Oral Oral Oral  SpO2:  99% 100% 100%  Weight:      Height:        Wt Readings from Last 3 Encounters:  08/12/17 220 lb (99.8 kg)  03/12/17 213 lb (96.6 kg)  12/01/16 212 lb (96.2 kg)     Intake/Output Summary (Last 24 hours) at 08/16/2017 1428 Last data filed at 08/16/2017 1018 Gross per 24 hour  Intake 120 ml  Output 1000 ml  Net -880 ml    Physical Exam:   GENERAL: Pleasant-appearing in no apparent distress.  HEAD, EYES, EARS, NOSE AND THROAT: Atraumatic, normocephalic. Extraocular muscles are intact. Pupils equal and reactive to light. Sclerae anicteric. No conjunctival injection. No oro-pharyngeal erythema.  NECK: Supple. There is no jugular venous distention. No bruits, no lymphadenopathy, no thyromegaly.  HEART: Regular rate and rhythm,. No murmurs, no rubs, no clicks.  LUNGS: Clear to auscultation bilaterally. No rales or rhonchi. No wheezes.  ABDOMEN: Soft, flat, nontender, nondistended. Has good bowel sounds. No hepatosplenomegaly appreciated.  EXTREMITIES: No evidence of any  cyanosis, clubbing, or peripheral edema.  +2 pedal and radial pulses bilaterally.  NEUROLOGIC: Awake able to follow commands SKIN: Moist and warm with no rashes appreciated.  Psych: Not anxious, depressed LN: No inguinal LN enlargement    Antibiotics   Anti-infectives (From admission, onward)   None      Medications   Scheduled Meds: . aspirin  300 mg Rectal Daily   Or  . aspirin  325 mg Oral Daily  . atorvastatin  40 mg Oral q1800  . clopidogrel  75 mg Oral Daily  . enoxaparin (LOVENOX) injection  40 mg  Subcutaneous Q24H  . hydrALAZINE  50 mg Oral Q8H  . hydrochlorothiazide  12.5 mg Oral Daily  . insulin aspart  0-5 Units Subcutaneous QHS  . insulin aspart  0-9 Units Subcutaneous TID WC  . insulin aspart  3 Units Subcutaneous TID WC  . insulin glargine  15 Units Subcutaneous QHS  . lacosamide  50 mg Oral BID  . losartan  100 mg Oral Daily  . Valproate Sodium  750 mg Oral BID   Continuous Infusions:  PRN Meds:.bisacodyl, LORazepam, nitroGLYCERIN, senna-docusate   Data Review:   Micro Results No results found for this or any previous visit (from the past 240 hour(s)).  Radiology Reports Dg Chest 2 View  Result Date: 08/12/2017 CLINICAL DATA:  Patient poor historian at this time. Per ED RN notes: Pt arrived via EMS from home with reports of aphasia that started at an unknown time. Son states pt fell last evening and was at her house from about 8pm until 9pm. EXAM: CHEST - 2 VIEW COMPARISON:  02/29/2016 FINDINGS: Heart size is accentuated by the AP technique. There are no focal consolidations or pleural effusions. No pulmonary edema. There is midthoracic spondylosis. IMPRESSION: No active cardiopulmonary disease. Electronically Signed   By: Nolon Nations M.D.   On: 08/12/2017 13:16   Ct Head Wo Contrast  Result Date: 08/12/2017 CLINICAL DATA:  Aphasia.  Fell last night. EXAM: CT HEAD WITHOUT CONTRAST TECHNIQUE: Contiguous axial images were obtained from the base of  the skull through the vertex without intravenous contrast. COMPARISON:  Brain MR dated 09/02/2016 and head CT dated 09/02/2016. FINDINGS: Brain: Diffusely enlarged ventricles and subarachnoid spaces. Patchy white matter low density in both cerebral hemispheres. No intracranial hemorrhage, mass lesion or CT evidence of acute infarction. Vascular: No hyperdense vessel or unexpected calcification. Skull: Normal. Negative for fracture or focal lesion. Sinuses/Orbits: Unremarkable. Other: None. IMPRESSION: 1. No acute abnormality. 2. Mild diffuse cerebral and cerebellar atrophy and mild chronic small vessel white matter ischemic changes in both cerebral hemispheres. Electronically Signed   By: Claudie Revering M.D.   On: 08/12/2017 12:12   Mr Brain Wo Contrast  Result Date: 08/13/2017 CLINICAL DATA:  Aphasia.  Hyperglycemia. EXAM: MRI HEAD WITHOUT CONTRAST MRA HEAD WITHOUT CONTRAST TECHNIQUE: Multiplanar, multiecho pulse sequences of the brain and surrounding structures were obtained without intravenous contrast. Angiographic images of the head were obtained using MRA technique without contrast. COMPARISON:  Limited brain MRI 08/12/2017. Head CT 08/12/2017. Brain MRI/MRA 09/02/2016. FINDINGS: MRI HEAD FINDINGS Brain: No acute infarct, mass, midline shift, or extra-axial fluid collection is identified. Chronic microhemorrhages are again seen in the thalami, right lentiform nucleus, and pons suggesting chronic hypertension as the etiology. Patchy T2 hyperintensities in the cerebral white matter, thalami, and pons were more conspicuous on the 2018 MRI (less motion and higher magnetic field strength) but have likely not significantly changed and are nonspecific but compatible with moderate chronic small vessel ischemic disease. Chronic lacunar infarcts are present in the left corona radiata, posterior left frontal subcortical white matter, and right cerebellum. Mild cerebral atrophy is within normal limits for age. Vascular:  Major intracranial vascular flow voids are preserved. Skull and upper cervical spine: Unremarkable calvarial bone marrow signal. Chronic bone marrow heterogeneity in the visualized cervical spine with underlying disc degeneration. Sinuses/Orbits: Bilateral cataract extraction. Paranasal sinuses and mastoid air cells are clear. Other: None. MRA HEAD FINDINGS The study is moderately motion degraded. The visualized distal vertebral arteries are patent to the basilar with the left being  dominant. There is an apparent moderate distal right V4 stenosis which is likely accentuated by motion artifact. PICAs, AICAs, and SCAs are grossly patent though not well evaluated due to motion. The basilar artery is patent without evidence of significant stenosis allowing for prominent motion artifact distally. PCAs are patent without evidence of flow limiting proximal stenosis. There is an improved appearance of the right P1-P2 junction suggesting that the moderate narrowing on the prior MRA was largely artifactual. The internal carotid arteries are patent from skull base to carotid termini with prominent motion artifact through the cavernous segments and no evidence of significant ICA stenosis elsewhere. The MCAs are patent without evidence of significant M1 stenosis or proximal branch occlusion although motion artifact limits branch vessel evaluation. There is rather extensive motion artifact through the ACAs with the right A1 segment being poorly visualized, particularly proximally. No sizable aneurysm is identified. IMPRESSION: 1. No acute intracranial abnormality. 2. Moderate chronic small vessel ischemic disease. 3. Motion degraded MRA. Poorly visualized right A1 segment for which a high-grade proximal stenosis is not excluded. 4. No evidence of high-grade intracranial ICA or M1 stenosis. 5. Moderate distal right V4 stenosis versus artifact. Electronically Signed   By: Logan Bores M.D.   On: 08/13/2017 11:25   Mr Brain Wo  Contrast  Result Date: 08/12/2017 CLINICAL DATA:  Altered mental status. Fell last night. Hyperglycemia. History of poorly controlled diabetes, stroke, hypertension, hyperlipidemia. EXAM: MRI HEAD WITHOUT CONTRAST TECHNIQUE: Sagittal T1, axial and coronal diffusion weighted imaging, axial T2 sequences obtained. Patient terminated the examination by climbing out of the scanner. COMPARISON:  CT HEAD August 12, 2016 and MRI of the head Sep 02, 2016 FINDINGS: Moderately motion degraded examination. INTRACRANIAL CONTENTS: No reduced diffusion to suggest acute ischemia, hypercellular tumor or infection. Limited assessment for blood products, no lobar hematoma. Old LEFT basal ganglia versus corona radiata infarct. Old small cerebellar infarcts. No hydrocephalus. Probable moderate chronic small vessel ischemic disease. No midline shift or mass effect. No abnormal extra-axial fluid collections. VASCULAR: Normal major intracranial vascular flow voids present at skull base. SKULL AND UPPER CERVICAL SPINE: No abnormal sellar expansion. No suspicious calvarial bone marrow signal. Craniocervical junction maintained. SINUSES/ORBITS: The mastoid air-cells and included paranasal sinuses are well-aerated.The included ocular globes and orbital contents are non-suspicious. Bilateral ocular lens implants. OTHER: None. IMPRESSION: 1. Limited 4 sequence MRI of the head, degraded by motion. No acute intracranial process. Electronically Signed   By: Elon Alas M.D.   On: 08/12/2017 15:41   US Carotid Bilateral (at [facility] And Ap Only)  Result Date: 08/13/2017 CLINICAL DATA:  Stroke EXAM: BILATERAL CAROTID DUPLEX ULTRASOUND TECHNIQUE: Pearline Cables scale imaging, color Doppler and duplex ultrasound were performed of bilateral carotid and vertebral arteries in the neck. COMPARISON:  None. FINDINGS: Criteria: Quantification of carotid stenosis is based on velocity parameters that correlate the residual internal carotid diameter with  NASCET-based stenosis levels, using the diameter of the distal internal carotid lumen as the denominator for stenosis measurement. The following velocity measurements were obtained: RIGHT ICA:  81 cm/sec CCA:  90 cm/sec SYSTOLIC ICA/CCA RATIO:  0.9 DIASTOLIC ICA/CCA RATIO: ECA:  169 cm/sec LEFT ICA:  55 cm/sec CCA:  998 cm/sec SYSTOLIC ICA/CCA RATIO:  0.5 DIASTOLIC ICA/CCA RATIO: ECA:  89 cm/sec RIGHT CAROTID ARTERY: Moderate calcified plaque in the bulb. Low resistance internal carotid Doppler pattern. RIGHT VERTEBRAL ARTERY:  Antegrade. LEFT CAROTID ARTERY: Mild smooth mixed plaque in the bulb. Low resistance internal carotid Doppler pattern. LEFT VERTEBRAL ARTERY:  Antegrade. IMPRESSION: Less than 50% stenosis in the right and left internal carotid arteries. Electronically Signed   By: Marybelle Killings M.D.   On: 08/13/2017 09:58   Mr Jodene Nam Head/brain HC Cm  Result Date: 08/13/2017 CLINICAL DATA:  Aphasia.  Hyperglycemia. EXAM: MRI HEAD WITHOUT CONTRAST MRA HEAD WITHOUT CONTRAST TECHNIQUE: Multiplanar, multiecho pulse sequences of the brain and surrounding structures were obtained without intravenous contrast. Angiographic images of the head were obtained using MRA technique without contrast. COMPARISON:  Limited brain MRI 08/12/2017. Head CT 08/12/2017. Brain MRI/MRA 09/02/2016. FINDINGS: MRI HEAD FINDINGS Brain: No acute infarct, mass, midline shift, or extra-axial fluid collection is identified. Chronic microhemorrhages are again seen in the thalami, right lentiform nucleus, and pons suggesting chronic hypertension as the etiology. Patchy T2 hyperintensities in the cerebral white matter, thalami, and pons were more conspicuous on the 2018 MRI (less motion and higher magnetic field strength) but have likely not significantly changed and are nonspecific but compatible with moderate chronic small vessel ischemic disease. Chronic lacunar infarcts are present in the left corona radiata, posterior left frontal  subcortical white matter, and right cerebellum. Mild cerebral atrophy is within normal limits for age. Vascular: Major intracranial vascular flow voids are preserved. Skull and upper cervical spine: Unremarkable calvarial bone marrow signal. Chronic bone marrow heterogeneity in the visualized cervical spine with underlying disc degeneration. Sinuses/Orbits: Bilateral cataract extraction. Paranasal sinuses and mastoid air cells are clear. Other: None. MRA HEAD FINDINGS The study is moderately motion degraded. The visualized distal vertebral arteries are patent to the basilar with the left being dominant. There is an apparent moderate distal right V4 stenosis which is likely accentuated by motion artifact. PICAs, AICAs, and SCAs are grossly patent though not well evaluated due to motion. The basilar artery is patent without evidence of significant stenosis allowing for prominent motion artifact distally. PCAs are patent without evidence of flow limiting proximal stenosis. There is an improved appearance of the right P1-P2 junction suggesting that the moderate narrowing on the prior MRA was largely artifactual. The internal carotid arteries are patent from skull base to carotid termini with prominent motion artifact through the cavernous segments and no evidence of significant ICA stenosis elsewhere. The MCAs are patent without evidence of significant M1 stenosis or proximal branch occlusion although motion artifact limits branch vessel evaluation. There is rather extensive motion artifact through the ACAs with the right A1 segment being poorly visualized, particularly proximally. No sizable aneurysm is identified. IMPRESSION: 1. No acute intracranial abnormality. 2. Moderate chronic small vessel ischemic disease. 3. Motion degraded MRA. Poorly visualized right A1 segment for which a high-grade proximal stenosis is not excluded. 4. No evidence of high-grade intracranial ICA or M1 stenosis. 5. Moderate distal right V4  stenosis versus artifact. Electronically Signed   By: Logan Bores M.D.   On: 08/13/2017 11:25     CBC Recent Labs  Lab 08/12/17 1131  WBC 11.6*  HGB 12.4  HCT 38.9  PLT 247  MCV 85.5  MCH 27.4  MCHC 32.0  RDW 16.0*  LYMPHSABS 1.6  MONOABS 0.5  EOSABS 0.0  BASOSABS 0.0    Chemistries  Recent Labs  Lab 08/12/17 1131 08/15/17 0501 08/16/17 0427  NA 131*  --  135  K 4.3  --  3.4*  CL 95*  --  101  CO2 28  --  26  GLUCOSE 532*  --  210*  BUN 23*  --  16  CREATININE 1.35* 0.91 0.89  CALCIUM 8.8*  --  8.7*  AST 31  --   --   ALT 16  --   --   ALKPHOS 85  --   --   BILITOT 1.3*  --   --    ------------------------------------------------------------------------------------------------------------------ estimated creatinine clearance is 61.7 mL/min (by C-G formula based on SCr of 0.89 mg/dL). ------------------------------------------------------------------------------------------------------------------ No results for input(s): HGBA1C in the last 72 hours. ------------------------------------------------------------------------------------------------------------------ No results for input(s): CHOL, HDL, LDLCALC, TRIG, CHOLHDL, LDLDIRECT in the last 72 hours. ------------------------------------------------------------------------------------------------------------------ No results for input(s): TSH, T4TOTAL, T3FREE, THYROIDAB in the last 72 hours.  Invalid input(s): FREET3 ------------------------------------------------------------------------------------------------------------------ No results for input(s): VITAMINB12, FOLATE, FERRITIN, TIBC, IRON, RETICCTPCT in the last 72 hours.  Coagulation profile Recent Labs  Lab 08/12/17 1131  INR 1.01    No results for input(s): DDIMER in the last 72 hours.  Cardiac Enzymes Recent Labs  Lab 08/12/17 1131  TROPONINI 0.07*    ------------------------------------------------------------------------------------------------------------------ Invalid input(s): POCBNP    Assessment & Plan  Patient is a 77 year old admitted with aphasia   #1 aphasia suspect this is related to seizure MRI of the brain is negative for stroke Neurology has started patient on Depakote,750 mg po bid, Start Vimpat with 100mg  IV load then maintenance of 50mg  BID per Dr. Doy Mince. EEGreviewed by neurology  #2 diabetes type 2 Patient will need insulin on discharge Continue Lantus and pre-meal insulin with sliding scale AC and HS. Blood sugar stable  #3 Acute renal failure with dehydration.improved.  #4 hypertension, continue Hyzaar  #5 hyperlipidemia continue Lipitor PT: SNF I discussed with Dr. Doy Mince.    Code Status Orders  (From admission, onward)        Start     Ordered   08/12/17 1728  Do not attempt resuscitation (DNR)  Continuous    Question Answer Comment  In the event of cardiac or respiratory ARREST Do not call a "code blue"   In the event of cardiac or respiratory ARREST Do not perform Intubation, CPR, defibrillation or ACLS   In the event of cardiac or respiratory ARREST Use medication by any route, position, wound care, and other measures to relive pain and suffering. May use oxygen, suction and manual treatment of airway obstruction as needed for comfort.      08/12/17 1727    Code Status History    Date Active Date Inactive Code Status Order ID Comments User Context   09/02/2016 1536 09/05/2016 2126 Full Code [id number]  Radene Gunning, NP ED   02/29/2016 1533 03/02/2016 2108 Full Code [id number]  Samella Parr, NP Inpatient   09/22/2014 1406 09/25/2014 1717 Full Code [id number]  [name], MD Inpatient    Advance Directive Documentation     Most Recent Value  Type of Advance Directive  Living will  Pre-existing out of facility DNR order (yellow form or pink MOST form)  -  "MOST" Form in  Place?  -           Consults  356 min  DVT Prophylaxis  Lovenox  Lab Results  Component Value Date   PLT 247 08/12/2017     Time Spent in minutes    Greater than 50% of time spent in care coordination and counseling patient regarding the condition and plan of care.   Demetrios Loll M.D on 08/16/2017 at 2:28 PM  Between 7am to 6pm - Pager - [phone]  After 6pm go to [email or web address] - Proofreader  [employer]   Office  [phone]

## 2017-08-16 NOTE — Discharge Summary (Signed)
[facility] NAME: Kristen [name]    MR#:  [id number]  DATE OF BIRTH:  1941-01-05  DATE OF ADMISSION:  08/12/2017   ADMITTING PHYSICIAN: Demetrios Loll, MD  DATE OF DISCHARGE: 08/16/2017  PRIMARY CARE PHYSICIAN: [name], MD   ADMISSION DIAGNOSIS:  Aphasia [R47.01] Hyperglycemia [R73.9] Altered mental status, unspecified altered mental status type [R41.82] DISCHARGE DIAGNOSIS:  Active Problems:   Acute CVA (cerebrovascular accident) ([place])   Aphasia  SECONDARY DIAGNOSIS:   Past Medical History:  Diagnosis Date  . Abdominal aortic atherosclerosis ([place]) by CT 02/2014  . CAD (coronary artery disease)    by CT, per pt h/o MI  . Diabetes type 2, uncontrolled ([place])   . Frequent headaches   . GERD (gastroesophageal reflux disease)   . Gout   . History of pulmonary embolism 2012  . HLD (hyperlipidemia)   . HTN (hypertension)   . 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 Morbid obesity ([place])   . Myocardial infarction [facility]) 2012   per pt. report, states she was treated with medicine, here at [facility]    . Osteoarthritis    knees  . Primary localized osteoarthritis of left knee 09/22/2014  . Sleep apnea 2011   study done in [place], states that since she lost weight she doesn't use the CPAP any longer & she doesn't have a problem with sleep apnea  . Stroke [facility])    still has balance problem on occas. , uses cane but that's mainly for the left knee pain  . Syncope 03/01/2016  . Thoracic aortic atherosclerosis ([place]) 12/2015   by CXR  . Vertigo    hx. benign postitional postural   HOSPITAL COURSE:   Patient is a 77 year old admitted with aphasia   #1 aphasia suspect this is related to seizure MRI of the brain is negative for stroke Neurology has started patient on Depakote, 750 mg po bid per Dr. Doy Mince. EEG reviewed by  neurology  #2 diabetes type 2 Patient will need insulin on discharge Continue Lantus and pre-meal insulin with sliding scale AC and HS. Blood sugar stable  #3 Acute renal failure with dehydration.  improved.  #4 hypertension, continue Hyzaar  #5 hyperlipidemia continue Lipitor PT: SNF DISCHARGE CONDITIONS:  Stable, discharge to skilled nursing facility today. CONSULTS OBTAINED:  Treatment Team:  Alexis Goodell, MD DRUG ALLERGIES:   Allergies  Allergen Reactions  . Acetaminophen Other (See Comments)    "Causes me to spit up blood"  . Aleve [Naproxen Sodium] Other (See Comments)    Spits up blood  . Doxycycline Other (See Comments)    Malaise, GI upset, "felt drunk" and very ill  . Metformin And Related Other (See Comments)    Chills, dizziness  . Penicillins Rash    Has patient had a PCN reaction causing immediate rash, facial/tongue/throat swelling, SOB or lightheadedness with hypotension: Yes Has patient had a PCN reaction causing severe rash involving mucus membranes or skin necrosis: No Has patient had a PCN reaction that required hospitalization No Has patient had a PCN reaction occurring within the last 10 years: No If all of the above answers are "NO", then may proceed with Cephalosporin use.    DISCHARGE MEDICATIONS:   Allergies as of 08/16/2017      Reactions   Acetaminophen Other (See Comments)   "Causes me to spit up blood"  Aleve [naproxen Sodium] Other (See Comments)   Spits up blood   Doxycycline Other (See Comments)   Malaise, GI upset, "felt drunk" and very ill   Metformin And Related Other (See Comments)   Chills, dizziness   Penicillins Rash   Has patient had a PCN reaction causing immediate rash, facial/tongue/throat swelling, SOB or lightheadedness with hypotension: Yes Has patient had a PCN reaction causing severe rash involving mucus membranes or skin necrosis: No Has patient had a PCN reaction that required hospitalization No Has  patient had a PCN reaction occurring within the last 10 years: No If all of the above answers are "NO", then may proceed with Cephalosporin use.      Medication List    STOP taking these medications   glimepiride 4 MG tablet Commonly known as:  AMARYL   glucose blood test strip   ONE TOUCH ULTRA SYSTEM KIT w/Device Kit   ONETOUCH DELICA LANCETS 41O Misc   pioglitazone 30 MG tablet Commonly known as:  ACTOS   pioglitazone-glimepiride 30-2 MG tablet Commonly known as:  DUETACT   simvastatin 20 MG tablet Commonly known as:  ZOCOR     TAKE these medications   atorvastatin 40 MG tablet Commonly known as:  LIPITOR Take 1 tablet (40 mg total) by mouth daily at 6 PM. What changed:    medication strength  how much to take   clopidogrel 75 MG tablet Commonly known as:  PLAVIX Take 1 tablet (75 mg total) by mouth daily.   clotrimazole 1 % cream Commonly known as:  LOTRIMIN AF Apply 1 application topically 2 (two) times daily. Rash under breast   Colchicine 0.6 MG Caps Commonly known as:  MITIGARE Take 1 tablet by mouth daily as needed.   hydrALAZINE 50 MG tablet Commonly known as:  APRESOLINE Take 1 tablet (50 mg total) by mouth every 8 (eight) hours.   insulin aspart 100 UNIT/ML injection Commonly known as:  novoLOG Inject 3 Units into the skin 3 (three) times daily with meals.   insulin glargine 100 UNIT/ML injection Commonly known as:  LANTUS Inject 0.15 mLs (15 Units total) into the skin at bedtime.   losartan-hydrochlorothiazide 100-12.5 MG tablet Commonly known as:  HYZAAR Take 1 tablet by mouth daily.   nitroGLYCERIN 0.4 MG SL tablet Commonly known as:  NITROSTAT Place 0.4 mg under the tongue every 5 (five) minutes as needed for chest pain.   potassium chloride 10 MEQ tablet Commonly known as:  K-DUR Take 1 tablet (10 mEq total) by mouth daily. What changed:  when to take this   Valproate Sodium 250 MG/5ML Soln solution Commonly known as:   DEPAKENE Take 15 mLs (750 mg total) by mouth 2 (two) times daily.   Vitamin D 2000 units Caps Take 1 capsule by mouth daily.        DISCHARGE INSTRUCTIONS:  See AVS.  If you experience worsening of your admission symptoms, develop shortness of breath, life threatening emergency, suicidal or homicidal thoughts you must seek medical attention immediately by calling 911 or calling your MD immediately  if symptoms less severe.  You Must read complete instructions/literature along with all the possible adverse reactions/side effects for all the Medicines you take and that have been prescribed to you. Take any new Medicines after you have completely understood and accpet all the possible adverse reactions/side effects.   Please note  You were cared for by a hospitalist during your hospital stay. If you have any questions about your discharge  medications or the care you received while you were in the hospital after you are discharged, you can call the unit and asked to speak with the hospitalist on call if the hospitalist that took care of you is not available. Once you are discharged, your primary care physician will handle any further medical issues. Please note that NO REFILLS for any discharge medications will be authorized once you are discharged, as it is imperative that you return to your primary care physician (or establish a relationship with a primary care physician if you do not have one) for your aftercare needs so that they can reassess your need for medications and monitor your lab values.    On the day of Discharge:  VITAL SIGNS:  Blood pressure (!) 148/66, pulse 61, temperature 98.2 F (36.8 C), temperature source Oral, resp. rate 18, height '5\' 4"'$  (1.626 m), weight 220 lb (99.8 kg), SpO2 100 %. PHYSICAL EXAMINATION:  GENERAL:  77 y.o.-year-old patient lying in the bed with no acute distress.  EYES: Pupils equal, round, reactive to light and accommodation. No scleral icterus.  Extraocular muscles intact.  HEENT: Head atraumatic, normocephalic. Oropharynx and nasopharynx clear.  NECK:  Supple, no jugular venous distention. No thyroid enlargement, no tenderness.  LUNGS: Normal breath sounds bilaterally, no wheezing, rales,rhonchi or crepitation. No use of accessory muscles of respiration.  CARDIOVASCULAR: S1, S2 normal. No murmurs, rubs, or gallops.  ABDOMEN: Soft, non-tender, non-distended. Bowel sounds present. No organomegaly or mass.  EXTREMITIES: No pedal edema, cyanosis, or clubbing.  NEUROLOGIC: Cranial nerves II through XII are intact. Muscle strength 5/5 in all extremities. Sensation intact. Gait not checked.  PSYCHIATRIC: The patient is alert and oriented x 3.  SKIN: No obvious rash, lesion, or ulcer.  DATA REVIEW:   CBC Recent Labs  Lab 08/12/17 1131  WBC 11.6*  HGB 12.4  HCT 38.9  PLT 247    Chemistries  Recent Labs  Lab 08/12/17 1131  08/16/17 0427  NA 131*  --  135  K 4.3  --  3.4*  CL 95*  --  101  CO2 28  --  26  GLUCOSE 532*  --  210*  BUN 23*  --  16  CREATININE 1.35*   < > 0.89  CALCIUM 8.8*  --  8.7*  AST 31  --   --   ALT 16  --   --   ALKPHOS 85  --   --   BILITOT 1.3*  --   --    < > = values in this interval not displayed.     Microbiology Results  Results for orders placed or performed during the hospital encounter of 09/10/14  Surgical pcr screen     Status: None   Collection Time: 09/10/14 12:02 PM  Result Value Ref Range Status   MRSA, PCR NEGATIVE NEGATIVE Final   Staphylococcus aureus NEGATIVE NEGATIVE Final    Comment:        The Xpert SA Assay (FDA approved for NASAL specimens in patients over 3 years of age), is one component of a comprehensive surveillance program.  Test performance has been validated by [facility] for patients greater than or equal to 23 year old. It is not intended to diagnose infection nor to guide or monitor treatment.     RADIOLOGY:  No results found.   Management  plans discussed with the patient, family and they are in agreement.  CODE STATUS: DNR   TOTAL TIME TAKING CARE OF THIS  PATIENT: 36 minutes.    Demetrios Loll M.D on 08/16/2017 at 8:38 AM  Between 7am to 6pm - Pager - [phone]  After 6pm go to [email or web address] - Proofreader  [facility]  Office  [phone]  CC: Primary care physician; [name], MD   Note: This dictation was prepared with Dragon dictation along with smaller phrase technology. Any transcriptional errors that result from this process are unintentional.

## 2017-08-16 NOTE — Progress Notes (Signed)
Subjective: Patient has had at least two episodes of facial twitching today.  They were short lived with no post-ictal phenomenon.  Remains on Depakote.    Objective: Current vital signs: BP (!) 148/66 (BP Location: Right Arm)   Pulse 61   Temp 98.2 F (36.8 C) (Oral)   Resp 18   Ht 5\' 4"  (1.626 m)   Wt 99.8 kg (220 lb)   SpO2 100%   BMI 37.76 kg/m  Vital signs in last 24 hours: Temp:  [98.2 F (36.8 C)-98.7 F (37.1 C)] 98.2 F (36.8 C) (04/25 0804) Pulse Rate:  [61-64] 61 (04/25 0804) Resp:  [18] 18 (04/25 0804) BP: (148-186)/(66-82) 148/66 (04/25 0804) SpO2:  [99 %-100 %] 100 % (04/25 0804)  Intake/Output from previous day: 04/24 0701 - 04/25 0700 In: 240 [P.O.:240] Out: 1000 [Urine:1000] Intake/Output this shift: Total I/O In: 120 [P.O.:120] Out: -  Nutritional status: Fall precautions Aspiration precautions Seizure precautions Aspiration precautions Fall precautions DIET DYS 3 Room service appropriate? Yes with Assist; Fluid consistency: Thin Diet - low sodium heart healthy  Neurologic Exam: Mental Status: Alert and awake.  Speech fluent without evidence of aphasia.  Able to follow commands without difficulty. Cranial Nerves: II: Discs flat bilaterally; Visual fields grossly normal, pupils equal, round, reactive to light and accommodation III,IV, VI: ptosis not present, extra-ocular motions intact bilaterally V,VII: mild right facial droop, facial light touch sensation normal bilaterally VIII: hearing normal bilaterally IX,X: gag reflex present XI: bilateral shoulder shrug XII: midline tongue extension Motor: Able to lift all extremities against gravity    Lab Results: Basic Metabolic Panel: Recent Labs  Lab 08/12/17 1131 08/15/17 0501 08/16/17 0427  NA 131*  --  135  K 4.3  --  3.4*  CL 95*  --  101  CO2 28  --  26  GLUCOSE 532*  --  210*  BUN 23*  --  16  CREATININE 1.35* 0.91 0.89  CALCIUM 8.8*  --  8.7*    Liver Function  Tests: Recent Labs  Lab 08/12/17 1131  AST 31  ALT 16  ALKPHOS 85  BILITOT 1.3*  PROT 7.1  ALBUMIN 3.7   No results for input(s): LIPASE, AMYLASE in the last 168 hours. No results for input(s): AMMONIA in the last 168 hours.  CBC: Recent Labs  Lab 08/12/17 1131  WBC 11.6*  NEUTROABS 9.4*  HGB 12.4  HCT 38.9  MCV 85.5  PLT 247    Cardiac Enzymes: Recent Labs  Lab 08/12/17 1131  TROPONINI 0.07*    Lipid Panel: Recent Labs  Lab 08/13/17 0439  CHOL 147  TRIG 91  HDL 46  CHOLHDL 3.2  VLDL 18  LDLCALC 83    CBG: Recent Labs  Lab 08/15/17 0755 08/15/17 1156 08/15/17 1641 08/15/17 2118 08/16/17 0750  GLUCAP 180* 203* 256* 139* 178*    Microbiology: Results for orders placed or performed during the hospital encounter of 09/10/14  Surgical pcr screen     Status: None   Collection Time: 09/10/14 12:02 PM  Result Value Ref Range Status   MRSA, PCR NEGATIVE NEGATIVE Final   Staphylococcus aureus NEGATIVE NEGATIVE Final    Comment:        The Xpert SA Assay (FDA approved for NASAL specimens in patients over 39 years of age), is one component of a comprehensive surveillance program.  Test performance has been validated by [facility] for patients greater than or equal to 62 year old. It is not intended to  diagnose infection nor to guide or monitor treatment.     Coagulation Studies: No results for input(s): LABPROT, INR in the last 72 hours.  Imaging: No results found.  Medications:  I have reviewed the patient's current medications. Scheduled: . aspirin  300 mg Rectal Daily   Or  . aspirin  325 mg Oral Daily  . atorvastatin  40 mg Oral q1800  . clopidogrel  75 mg Oral Daily  . enoxaparin (LOVENOX) injection  40 mg Subcutaneous Q24H  . hydrALAZINE  50 mg Oral Q8H  . hydrochlorothiazide  12.5 mg Oral Daily  . insulin aspart  0-5 Units Subcutaneous QHS  . insulin aspart  0-9 Units Subcutaneous TID WC  . insulin aspart  3 Units  Subcutaneous TID WC  . insulin glargine  15 Units Subcutaneous QHS  . lacosamide  50 mg Oral BID  . losartan  100 mg Oral Daily  . Valproate Sodium  750 mg Oral BID    Assessment/Plan: Patient has had at least two episodes of facial twitching today.  They were short lived with no post-ictal phenomenon.  Remains on Depakote.  Depakote level of 97 today.    Recommendations: 1.  Start Vimpat with 100mg  IV load then maintenance of 50mg  BID 2.  Continue Depakote at current dose.  Depakote level in AM 3.  Continue seizure precautions    LOS: 4 days   Alexis Goodell, MD Neurology [phone] 08/16/2017  11:11 AM

## 2017-08-16 NOTE — Progress Notes (Signed)
Patient A&Ox2 with confusion. Patient has had 1 episode of "ooohhhh" sounds with face and eyes pulling to the right. Patient is unable to speak and or focus during episode. Episode lasted approximately 30 seconds.  Maudie Mercury Gao Mitnick,LPN

## 2017-08-16 NOTE — Progress Notes (Signed)
PT Cancellation Note  Patient Details Name: Kristen [name] MRN: [id number] DOB: 08-14-1940   Cancelled Treatment:    Reason Eval/Treat Not Completed: Medical issues which prohibited therapy. Pt just with 4 seizures within 20 minutes per chart review.  Will hold PT until pt more medically appropriate for exertional activity.   Collie Siad PT, DPT 08/16/2017, 10:36 AM

## 2017-08-16 NOTE — Progress Notes (Signed)
OT Cancellation Note  Patient Details Name: Kristen [name] MRN: [id number] DOB: 1940-12-16   Cancelled Treatment:    Reason Eval/Treat Not Completed: Medical issues which prohibited therapy. Per chart review, RN noting pt recently had 4 seizures within 20 minutes and MD was notified. Will hold OT treatment until pt is medically appropriate.   Jeni Salles, MPH, MS, OTR/L ascom [phone] 08/16/17, 10:39 AM

## 2017-08-17 LAB — GLUCOSE, CAPILLARY
GLUCOSE-CAPILLARY: 199 mg/dL — AB (ref 65–99)
Glucose-Capillary: 95 mg/dL (ref 65–99)
Glucose-Capillary: 121 mg/dL — ABNORMAL HIGH (ref 65–99)
Glucose-Capillary: 189 mg/dL — ABNORMAL HIGH (ref 65–99)

## 2017-08-17 LAB — VALPROIC ACID LEVEL: Valproic Acid Lvl: 119 ug/mL — ABNORMAL HIGH (ref 50.0–100.0)

## 2017-08-17 MED ORDER — LACOSAMIDE 50 MG PO TABS
75.0000 mg | ORAL_TABLET | Freq: Two times a day (BID) | ORAL | Status: DC
  Administered 2017-08-17 – 2017-08-18 (×2): 75 mg via ORAL
  Filled 2017-08-17 (×2): qty 2

## 2017-08-17 MED ORDER — VALPROATE SODIUM 250 MG/5ML PO SOLN
500.0000 mg | Freq: Two times a day (BID) | ORAL | Status: DC
  Administered 2017-08-17 – 2017-08-18 (×2): 500 mg via ORAL
  Filled 2017-08-17 (×5): qty 10

## 2017-08-17 NOTE — Progress Notes (Signed)
Per [name] admissions coordinator at [facility] patient can come to [facility] over the weekend if medically stable.   [employer], LCSW [phone]

## 2017-08-17 NOTE — Progress Notes (Addendum)
ST services note for Discharge recommendations:  ST services are recommended for a FULL COGNITIVE assessment once Discharged to Rehab d/t pt's Cognitive presentation during this hospitalization - pt needs to be in a constant setting for ADLs. Pti converses in conversation w/ others, and is able to use her cell phone and order her meals. However, min unsure of pt's full Cognitive status post Seizures and Medication adjustment to manage seizures as she appears to require min increased time for processing of information and verbal cues for follow through intermittently; verbal reminders and direction intermittently as well. Pt was living at home Alone prior. Safety concerns at this time; pt may need more Supervision if returning home alone at discharge from SNF. CM updated.     Orinda Kenner, [place], CCC-SLP

## 2017-08-17 NOTE — Progress Notes (Addendum)
Occupational Therapy Treatment Patient Details Name: Kristen [name] MRN: [id number] DOB: [birth year] Today's Date: 08/17/2017    History of present illness Patient is a 77 year old female admitted for a CVA following c/o aphasia.  PMH includes vertigo, stroke, OA, DM, PE, Htn, MI, gout and frequent HA's.   OT comments  Pt. Education was provided about energy conservation, work simplification strategies, safety with ADLs. Pt. Was provided with a visual handout. Pt. Continues to benefit from OT services for ADL training, A/E training, and pt. Education about home modification, and DME in order to work towards improving ADLs, and IADL functioning as well as improving safety during self-care tasks. Pt. Continues to benefit from SNF level of care upon discharge with follow-up OT services to maximize independence with ADLs, and IADLs prior to returning home safely.   Follow Up Recommendations  SNF    Equipment Recommendations       Recommendations for Other Services      Precautions / Restrictions   Seizure Precautions             ADL either performed or assessed with clinical judgement   ADL Overall ADL's : Needs assistance/impaired Eating/Feeding: Independent;Bed level   Grooming: Sitting;Set up   Upper Body Bathing: Minimal assistance;Moderate assistance   Lower Body Bathing: Moderate assistance   Upper Body Dressing : Moderate assistance;Minimal assistance   Lower Body Dressing: Moderate assistance                 General ADL Comments: Pt. education was provided about energy conservation, work simplification techniques. Pt. was provided with a visual hand out.     Vision Patient Visual Report: No change from baseline Vision Assessment?: No apparent visual deficits   Perception     Praxis      Cognition Arousal/Alertness: Awake/alert Behavior During Therapy: WFL for tasks assessed/performed Overall Cognitive Status: Within Functional Limits for tasks  assessed                                          Exercises     Shoulder Instructions       General Comments      Pertinent Vitals/ Pain       Pain Assessment: No/denies pain  Home Living                                          Prior Functioning/Environment              Frequency  Min 1X/week        Progress Toward Goals  OT Goals(current goals can now be found in the care plan section)     Acute Rehab OT Goals Patient Stated Goal: To eventually return home. OT Goal Formulation: With patient/family Potential to Achieve Goals: Good  Plan      Co-evaluation                 AM-PAC PT "6 Clicks" Daily Activity     Outcome Measure   Help from another person eating meals?: None Help from another person taking care of personal grooming?: None Help from another person toileting, which includes using toliet, bedpan, or urinal?: A Lot Help from another person bathing (including washing, rinsing, drying)?: A Lot Help from another person to  put on and taking off regular upper body clothing?: A Little Help from another person to put on and taking off regular lower body clothing?: A Lot 6 Click Score: 17    End of Session    OT Visit Diagnosis: Unsteadiness on feet (R26.81);Muscle weakness (generalized) (M62.81);Cognitive communication deficit (R41.841)   Activity Tolerance Patient tolerated treatment well   Patient Left in bed;with call bell/phone within reach;with bed alarm set   Nurse Communication          Time: 1027-2536 OT Time Calculation (min): 15 min  Charges: OT General Charges $OT Visit: 1 Visit OT Treatments $Self Care/Home Management : 8-22 mins  Harrel Carina, MS, OTR/L    Harrel Carina, MS, OTR/L 08/17/2017, 2:42 PM

## 2017-08-17 NOTE — Progress Notes (Signed)
Physical Therapy Treatment Patient Details Name: Kristen [name] MRN: [id number] DOB: [birth year] Today's Date: 08/17/2017    History of Present Illness Patient is a 77 year old female admitted for a CVA following c/o aphasia.  PMH includes vertigo, stroke, OA, DM, PE, Htn, MI, gout and frequent HA's. Of note, all imaging negative for CVA, however has had several seizures while admitted.    PT Comments    Pt is making gradual progress towards goals. Appears to have more motivation for participation this date. Of note, has had multiple seizures this admission, precautions in place. Agreeable to sit in recliner however fatigues. Encouraged continued mobility efforts with RN staff to [facility] as pt with external cath in place upon arrival. Educated on benefits of mobility and muscle atrophy. Will continue to progress.   Follow Up Recommendations  SNF     Equipment Recommendations  None recommended by PT    Recommendations for Other Services       Precautions / Restrictions Precautions Precautions: Fall(seizures) Restrictions Weight Bearing Restrictions: No    Mobility  Bed Mobility Overal bed mobility: Needs Assistance Bed Mobility: Supine to Sit     Supine to sit: Min guard     General bed mobility comments: needs assist for scooting out towards EOB. Once seated, able to sit with upright posture  Transfers Overall transfer level: Needs assistance Equipment used: Rolling walker (2 wheeled) Transfers: Sit to/from Stand Sit to Stand: Min assist         General transfer comment: needs assistance to stand from lower surface. Once standing, uses RW heavily. Upright posture noted  Ambulation/Gait Ambulation/Gait assistance: Min assist Ambulation Distance (Feet): 5 Feet Assistive device: Rolling walker (2 wheeled) Gait Pattern/deviations: Step-to pattern     General Gait Details: ambulated to recliner with min assist and cues for sequencing. Needs verbal cues for reaching for  seated surface prior to sitting down.   Stairs             Wheelchair Mobility    Modified Rankin (Stroke Patients Only)       Balance                                            Cognition Arousal/Alertness: Awake/alert Behavior During Therapy: WFL for tasks assessed/performed Overall Cognitive Status: Within Functional Limits for tasks assessed                                        Exercises Other Exercises Other Exercises: deferred    General Comments        Pertinent Vitals/Pain Pain Assessment: No/denies pain    Home Living                      Prior Function            PT Goals (current goals can now be found in the care plan section) Acute Rehab PT Goals Patient Stated Goal: To eventually return home. PT Goal Formulation: With patient Time For Goal Achievement: 08/27/17 Potential to Achieve Goals: Good Progress towards PT goals: Progressing toward goals    Frequency    Min 2X/week      PT Plan Frequency needs to be updated    Co-evaluation  AM-PAC PT "6 Clicks" Daily Activity  Outcome Measure  Difficulty turning over in bed (including adjusting bedclothes, sheets and blankets)?: Unable Difficulty moving from lying on back to sitting on the side of the bed? : Unable Difficulty sitting down on and standing up from a chair with arms (e.g., wheelchair, bedside commode, etc,.)?: Unable Help needed moving to and from a bed to chair (including a wheelchair)?: A Little Help needed walking in hospital room?: A Little Help needed climbing 3-5 steps with a railing? : A Lot 6 Click Score: 11    End of Session Equipment Utilized During Treatment: Gait belt Activity Tolerance: Patient tolerated treatment well;Patient limited by fatigue Patient left: in chair;with call bell/phone within reach;with chair alarm set Nurse Communication: Mobility status PT Visit Diagnosis: Unsteadiness on  feet (R26.81);Muscle weakness (generalized) (M62.81)     Time: 9935-7017 PT Time Calculation (min) (ACUTE ONLY): 10 min  Charges:  $Therapeutic Activity: 8-22 mins                    G Codes:       Greggory Stallion, PT, DPT [phone]    Rylinn Linzy 08/17/2017, 4:40 PM

## 2017-08-17 NOTE — Progress Notes (Signed)
Subjective: Patient with last witnessed seizure early this morning.  Currently stable. Tolerating introduction of Vimpat.    Objective: Current vital signs: BP (!) 152/74 (BP Location: Left Arm)   Pulse 70   Temp 97.8 F (36.6 C) (Axillary)   Resp 18   Ht 5\' 4"  (1.626 m)   Wt 99.8 kg (220 lb)   SpO2 99%   BMI 37.76 kg/m  Vital signs in last 24 hours: Temp:  [97.6 F (36.4 C)-97.8 F (36.6 C)] 97.8 F (36.6 C) (04/26 0445) Pulse Rate:  [66-73] 70 (04/26 0445) Resp:  [12-18] 18 (04/26 0445) BP: (135-160)/(68-86) 152/74 (04/26 0445) SpO2:  [99 %-100 %] 99 % (04/26 0445)  Intake/Output from previous day: 04/25 0701 - 04/26 0700 In: 120 [P.O.:120] Out: 900 [Urine:900] Intake/Output this shift: Total I/O In: 0  Out: 800 [Urine:800] Nutritional status: Fall precautions Aspiration precautions Seizure precautions Aspiration precautions Fall precautions DIET DYS 3 Room service appropriate? Yes with Assist; Fluid consistency: Thin Diet - low sodium heart healthy  Neurologic Exam: Mental Status: Alertand awake. Speech fluent without evidence of aphasia. Able to follow commands without difficulty. Cranial Nerves: II: Discs flat bilaterally; Visual fields grossly normal, pupils equal, round, reactive to light and accommodation III,IV, VI: ptosis not present, extra-ocular motions intact bilaterally V,VII: mild right facial droop, facial light touch sensation normal bilaterally VIII: hearing normal bilaterally IX,X: gag reflex present XI: bilateral shoulder shrug XII: midline tongue extension Motor: Able to lift all extremities against gravity    Lab Results: Basic Metabolic Panel: Recent Labs  Lab 08/12/17 1131 08/15/17 0501 08/16/17 0427  NA 131*  --  135  K 4.3  --  3.4*  CL 95*  --  101  CO2 28  --  26  GLUCOSE 532*  --  210*  BUN 23*  --  16  CREATININE 1.35* 0.91 0.89  CALCIUM 8.8*  --  8.7*    Liver Function Tests: Recent Labs  Lab 08/12/17 1131   AST 31  ALT 16  ALKPHOS 85  BILITOT 1.3*  PROT 7.1  ALBUMIN 3.7   No results for input(s): LIPASE, AMYLASE in the last 168 hours. No results for input(s): AMMONIA in the last 168 hours.  CBC: Recent Labs  Lab 08/12/17 1131  WBC 11.6*  NEUTROABS 9.4*  HGB 12.4  HCT 38.9  MCV 85.5  PLT 247    Cardiac Enzymes: Recent Labs  Lab 08/12/17 1131  TROPONINI 0.07*    Lipid Panel: Recent Labs  Lab 08/13/17 0439  CHOL 147  TRIG 91  HDL 46  CHOLHDL 3.2  VLDL 18  LDLCALC 83    CBG: Recent Labs  Lab 08/16/17 1139 08/16/17 1641 08/16/17 2131 08/17/17 0754 08/17/17 1159  GLUCAP 265* 223* 127* 95 121*    Microbiology: Results for orders placed or performed during the hospital encounter of 09/10/14  Surgical pcr screen     Status: None   Collection Time: 09/10/14 12:02 PM  Result Value Ref Range Status   MRSA, PCR NEGATIVE NEGATIVE Final   Staphylococcus aureus NEGATIVE NEGATIVE Final    Comment:        The Xpert SA Assay (FDA approved for NASAL specimens in patients over 3 years of age), is one component of a comprehensive surveillance program.  Test performance has been validated by [facility] for patients greater than or equal to 95 year old. It is not intended to diagnose infection nor to guide or monitor treatment.  Coagulation Studies: No results for input(s): LABPROT, INR in the last 72 hours.  Imaging: No results found.  Medications:  I have reviewed the patient's current medications. Scheduled: . aspirin  300 mg Rectal Daily   Or  . aspirin  325 mg Oral Daily  . atorvastatin  40 mg Oral q1800  . clopidogrel  75 mg Oral Daily  . enoxaparin (LOVENOX) injection  40 mg Subcutaneous Q24H  . hydrALAZINE  50 mg Oral Q8H  . hydrochlorothiazide  12.5 mg Oral Daily  . insulin aspart  0-5 Units Subcutaneous QHS  . insulin aspart  0-9 Units Subcutaneous TID WC  . insulin aspart  3 Units Subcutaneous TID WC  . insulin glargine  20 Units  Subcutaneous QHS  . lacosamide  75 mg Oral BID  . losartan  100 mg Oral Daily  . Valproate Sodium  750 mg Oral BID    Assessment/Plan: Last witnessed seizure this morning.  On Depakote with level of 119.  Vimpat at 50mg  BID  Recommendations: 1. Decreased Depakote back to 500mg  BID 2. Increase Vimpat to 75mg  BID 3. Continue seizure precautions 4. Depakote level in AM   LOS: 5 days   Alexis Goodell, MD Neurology [phone] 08/17/2017  2:25 PM

## 2017-08-17 NOTE — Progress Notes (Signed)
[place] at [facility]                                                                                                                                                                                  Patient Demographics   Kristen [name], is a 77 y.o. female, DOB - [birth year], EAV:[id number]  Admit date - 08/12/2017   Admitting Physician Demetrios Loll, MD  Outpatient Primary MD for the patient is [name], MD   LOS - 5  Subjective: The patient has no complaints.  Per RN the patient had witnessed focal seizure last night and early this morning.   Review of Systems:   CONSTITUTIONAL: Limited due to her current condition  Vitals:   Vitals:   08/16/17 0804 08/16/17 1505 08/16/17 2118 08/17/17 0445  BP: (!) 148/66 (!) 160/86 135/68 (!) 152/74  Pulse: 61 73 66 70  Resp: 18 12 18 18   Temp: 98.2 F (36.8 C)  97.6 F (36.4 C) 97.8 F (36.6 C)  TempSrc: Oral  Oral Axillary  SpO2: 100% 100% 99% 99%  Weight:      Height:        Wt Readings from Last 3 Encounters:  08/12/17 220 lb (99.8 kg)  03/12/17 213 lb (96.6 kg)  12/01/16 212 lb (96.2 kg)     Intake/Output Summary (Last 24 hours) at 08/17/2017 1442 Last data filed at 08/17/2017 1045 Gross per 24 hour  Intake 0 ml  Output 1700 ml  Net -1700 ml    Physical Exam:   GENERAL: Pleasant-appearing in no apparent distress.  HEAD, EYES, EARS, NOSE AND THROAT: Atraumatic, normocephalic. Extraocular muscles are intact. Pupils equal and reactive to light. Sclerae anicteric. No conjunctival injection. No oro-pharyngeal erythema.  NECK: Supple. There is no jugular venous distention. No bruits, no lymphadenopathy, no thyromegaly.  HEART: Regular rate and rhythm,. No murmurs, no rubs, no clicks.  LUNGS: Clear to auscultation bilaterally. No rales or rhonchi. No wheezes.  ABDOMEN: Soft, flat, nontender, nondistended. Has good bowel sounds. No hepatosplenomegaly appreciated.  EXTREMITIES: No evidence of  any cyanosis, clubbing, or peripheral edema.  +2 pedal and radial pulses bilaterally.  NEUROLOGIC: Awake able to follow commands SKIN: Moist and warm with no rashes appreciated.  Psych: Not anxious, depressed LN: No inguinal LN enlargement    Antibiotics   Anti-infectives (From admission, onward)   None      Medications   Scheduled Meds: . aspirin  300 mg Rectal Daily   Or  . aspirin  325 mg Oral Daily  . atorvastatin  40 mg Oral q1800  . clopidogrel  75 mg Oral Daily  . enoxaparin (LOVENOX) injection  40  mg Subcutaneous Q24H  . hydrALAZINE  50 mg Oral Q8H  . hydrochlorothiazide  12.5 mg Oral Daily  . insulin aspart  0-5 Units Subcutaneous QHS  . insulin aspart  0-9 Units Subcutaneous TID WC  . insulin aspart  3 Units Subcutaneous TID WC  . insulin glargine  20 Units Subcutaneous QHS  . lacosamide  75 mg Oral BID  . losartan  100 mg Oral Daily  . Valproate Sodium  500 mg Oral BID   Continuous Infusions:  PRN Meds:.bisacodyl, LORazepam, nitroGLYCERIN, senna-docusate   Data Review:   Micro Results No results found for this or any previous visit (from the past 240 hour(s)).  Radiology Reports Dg Chest 2 View  Result Date: 08/12/2017 CLINICAL DATA:  Patient poor historian at this time. Per ED RN notes: Pt arrived via EMS from home with reports of aphasia that started at an unknown time. Son states pt fell last evening and was at her house from about 8pm until 9pm. EXAM: CHEST - 2 VIEW COMPARISON:  02/29/2016 FINDINGS: Heart size is accentuated by the AP technique. There are no focal consolidations or pleural effusions. No pulmonary edema. There is midthoracic spondylosis. IMPRESSION: No active cardiopulmonary disease. Electronically Signed   By: Nolon Nations M.D.   On: 08/12/2017 13:16   Ct Head Wo Contrast  Result Date: 08/12/2017 CLINICAL DATA:  Aphasia.  Fell last night. EXAM: CT HEAD WITHOUT CONTRAST TECHNIQUE: Contiguous axial images were obtained from the base  of the skull through the vertex without intravenous contrast. COMPARISON:  Brain MR dated 09/02/2016 and head CT dated 09/02/2016. FINDINGS: Brain: Diffusely enlarged ventricles and subarachnoid spaces. Patchy white matter low density in both cerebral hemispheres. No intracranial hemorrhage, mass lesion or CT evidence of acute infarction. Vascular: No hyperdense vessel or unexpected calcification. Skull: Normal. Negative for fracture or focal lesion. Sinuses/Orbits: Unremarkable. Other: None. IMPRESSION: 1. No acute abnormality. 2. Mild diffuse cerebral and cerebellar atrophy and mild chronic small vessel white matter ischemic changes in both cerebral hemispheres. Electronically Signed   By: Claudie Revering M.D.   On: 08/12/2017 12:12   Mr Brain Wo Contrast  Result Date: 08/13/2017 CLINICAL DATA:  Aphasia.  Hyperglycemia. EXAM: MRI HEAD WITHOUT CONTRAST MRA HEAD WITHOUT CONTRAST TECHNIQUE: Multiplanar, multiecho pulse sequences of the brain and surrounding structures were obtained without intravenous contrast. Angiographic images of the head were obtained using MRA technique without contrast. COMPARISON:  Limited brain MRI 08/12/2017. Head CT 08/12/2017. Brain MRI/MRA 09/02/2016. FINDINGS: MRI HEAD FINDINGS Brain: No acute infarct, mass, midline shift, or extra-axial fluid collection is identified. Chronic microhemorrhages are again seen in the thalami, right lentiform nucleus, and pons suggesting chronic hypertension as the etiology. Patchy T2 hyperintensities in the cerebral white matter, thalami, and pons were more conspicuous on the 2018 MRI (less motion and higher magnetic field strength) but have likely not significantly changed and are nonspecific but compatible with moderate chronic small vessel ischemic disease. Chronic lacunar infarcts are present in the left corona radiata, posterior left frontal subcortical white matter, and right cerebellum. Mild cerebral atrophy is within normal limits for age.  Vascular: Major intracranial vascular flow voids are preserved. Skull and upper cervical spine: Unremarkable calvarial bone marrow signal. Chronic bone marrow heterogeneity in the visualized cervical spine with underlying disc degeneration. Sinuses/Orbits: Bilateral cataract extraction. Paranasal sinuses and mastoid air cells are clear. Other: None. MRA HEAD FINDINGS The study is moderately motion degraded. The visualized distal vertebral arteries are patent to the basilar with the left  being dominant. There is an apparent moderate distal right V4 stenosis which is likely accentuated by motion artifact. PICAs, AICAs, and SCAs are grossly patent though not well evaluated due to motion. The basilar artery is patent without evidence of significant stenosis allowing for prominent motion artifact distally. PCAs are patent without evidence of flow limiting proximal stenosis. There is an improved appearance of the right P1-P2 junction suggesting that the moderate narrowing on the prior MRA was largely artifactual. The internal carotid arteries are patent from skull base to carotid termini with prominent motion artifact through the cavernous segments and no evidence of significant ICA stenosis elsewhere. The MCAs are patent without evidence of significant M1 stenosis or proximal branch occlusion although motion artifact limits branch vessel evaluation. There is rather extensive motion artifact through the ACAs with the right A1 segment being poorly visualized, particularly proximally. No sizable aneurysm is identified. IMPRESSION: 1. No acute intracranial abnormality. 2. Moderate chronic small vessel ischemic disease. 3. Motion degraded MRA. Poorly visualized right A1 segment for which a high-grade proximal stenosis is not excluded. 4. No evidence of high-grade intracranial ICA or M1 stenosis. 5. Moderate distal right V4 stenosis versus artifact. Electronically Signed   By: Logan Bores M.D.   On: 08/13/2017 11:25   Mr  Brain Wo Contrast  Result Date: 08/12/2017 CLINICAL DATA:  Altered mental status. Fell last night. Hyperglycemia. History of poorly controlled diabetes, stroke, hypertension, hyperlipidemia. EXAM: MRI HEAD WITHOUT CONTRAST TECHNIQUE: Sagittal T1, axial and coronal diffusion weighted imaging, axial T2 sequences obtained. Patient terminated the examination by climbing out of the scanner. COMPARISON:  CT HEAD August 12, 2016 and MRI of the head Sep 02, 2016 FINDINGS: Moderately motion degraded examination. INTRACRANIAL CONTENTS: No reduced diffusion to suggest acute ischemia, hypercellular tumor or infection. Limited assessment for blood products, no lobar hematoma. Old LEFT basal ganglia versus corona radiata infarct. Old small cerebellar infarcts. No hydrocephalus. Probable moderate chronic small vessel ischemic disease. No midline shift or mass effect. No abnormal extra-axial fluid collections. VASCULAR: Normal major intracranial vascular flow voids present at skull base. SKULL AND UPPER CERVICAL SPINE: No abnormal sellar expansion. No suspicious calvarial bone marrow signal. Craniocervical junction maintained. SINUSES/ORBITS: The mastoid air-cells and included paranasal sinuses are well-aerated.The included ocular globes and orbital contents are non-suspicious. Bilateral ocular lens implants. OTHER: None. IMPRESSION: 1. Limited 4 sequence MRI of the head, degraded by motion. No acute intracranial process. Electronically Signed   By: Elon Alas M.D.   On: 08/12/2017 15:41   US Carotid Bilateral (at [facility] And [facility] Only)  Result Date: 08/13/2017 CLINICAL DATA:  Stroke EXAM: BILATERAL CAROTID DUPLEX ULTRASOUND TECHNIQUE: Pearline Cables scale imaging, color Doppler and duplex ultrasound were performed of bilateral carotid and vertebral arteries in the neck. COMPARISON:  None. FINDINGS: Criteria: Quantification of carotid stenosis is based on velocity parameters that correlate the residual internal carotid diameter with  NASCET-based stenosis levels, using the diameter of the distal internal carotid lumen as the denominator for stenosis measurement. The following velocity measurements were obtained: RIGHT ICA:  81 cm/sec CCA:  90 cm/sec SYSTOLIC ICA/CCA RATIO:  0.9 DIASTOLIC ICA/CCA RATIO: ECA:  169 cm/sec LEFT ICA:  55 cm/sec CCA:  762 cm/sec SYSTOLIC ICA/CCA RATIO:  0.5 DIASTOLIC ICA/CCA RATIO: ECA:  89 cm/sec RIGHT CAROTID ARTERY: Moderate calcified plaque in the bulb. Low resistance internal carotid Doppler pattern. RIGHT VERTEBRAL ARTERY:  Antegrade. LEFT CAROTID ARTERY: Mild smooth mixed plaque in the bulb. Low resistance internal carotid Doppler pattern. LEFT VERTEBRAL ARTERY:  Antegrade. IMPRESSION: Less than 50% stenosis in the right and left internal carotid arteries. Electronically Signed   By: Marybelle Killings M.D.   On: 08/13/2017 09:58   Mr Jodene Nam Head/brain KZ Cm  Result Date: 08/13/2017 CLINICAL DATA:  Aphasia.  Hyperglycemia. EXAM: MRI HEAD WITHOUT CONTRAST MRA HEAD WITHOUT CONTRAST TECHNIQUE: Multiplanar, multiecho pulse sequences of the brain and surrounding structures were obtained without intravenous contrast. Angiographic images of the head were obtained using MRA technique without contrast. COMPARISON:  Limited brain MRI 08/12/2017. Head CT 08/12/2017. Brain MRI/MRA 09/02/2016. FINDINGS: MRI HEAD FINDINGS Brain: No acute infarct, mass, midline shift, or extra-axial fluid collection is identified. Chronic microhemorrhages are again seen in the thalami, right lentiform nucleus, and pons suggesting chronic hypertension as the etiology. Patchy T2 hyperintensities in the cerebral white matter, thalami, and pons were more conspicuous on the 2018 MRI (less motion and higher magnetic field strength) but have likely not significantly changed and are nonspecific but compatible with moderate chronic small vessel ischemic disease. Chronic lacunar infarcts are present in the left corona radiata, posterior left frontal  subcortical white matter, and right cerebellum. Mild cerebral atrophy is within normal limits for age. Vascular: Major intracranial vascular flow voids are preserved. Skull and upper cervical spine: Unremarkable calvarial bone marrow signal. Chronic bone marrow heterogeneity in the visualized cervical spine with underlying disc degeneration. Sinuses/Orbits: Bilateral cataract extraction. Paranasal sinuses and mastoid air cells are clear. Other: None. MRA HEAD FINDINGS The study is moderately motion degraded. The visualized distal vertebral arteries are patent to the basilar with the left being dominant. There is an apparent moderate distal right V4 stenosis which is likely accentuated by motion artifact. PICAs, AICAs, and SCAs are grossly patent though not well evaluated due to motion. The basilar artery is patent without evidence of significant stenosis allowing for prominent motion artifact distally. PCAs are patent without evidence of flow limiting proximal stenosis. There is an improved appearance of the right P1-P2 junction suggesting that the moderate narrowing on the prior MRA was largely artifactual. The internal carotid arteries are patent from skull base to carotid termini with prominent motion artifact through the cavernous segments and no evidence of significant ICA stenosis elsewhere. The MCAs are patent without evidence of significant M1 stenosis or proximal branch occlusion although motion artifact limits branch vessel evaluation. There is rather extensive motion artifact through the ACAs with the right A1 segment being poorly visualized, particularly proximally. No sizable aneurysm is identified. IMPRESSION: 1. No acute intracranial abnormality. 2. Moderate chronic small vessel ischemic disease. 3. Motion degraded MRA. Poorly visualized right A1 segment for which a high-grade proximal stenosis is not excluded. 4. No evidence of high-grade intracranial ICA or M1 stenosis. 5. Moderate distal right V4  stenosis versus artifact. Electronically Signed   By: Logan Bores M.D.   On: 08/13/2017 11:25     CBC Recent Labs  Lab 08/12/17 1131  WBC 11.6*  HGB 12.4  HCT 38.9  PLT 247  MCV 85.5  MCH 27.4  MCHC 32.0  RDW 16.0*  LYMPHSABS 1.6  MONOABS 0.5  EOSABS 0.0  BASOSABS 0.0    Chemistries  Recent Labs  Lab 08/12/17 1131 08/15/17 0501 08/16/17 0427  NA 131*  --  135  K 4.3  --  3.4*  CL 95*  --  101  CO2 28  --  26  GLUCOSE 532*  --  210*  BUN 23*  --  16  CREATININE 1.35* 0.91 0.89  CALCIUM 8.8*  --  8.7*  AST 31  --   --   ALT 16  --   --   ALKPHOS 85  --   --   BILITOT 1.3*  --   --    ------------------------------------------------------------------------------------------------------------------ estimated creatinine clearance is 61.7 mL/min (by C-G formula based on SCr of 0.89 mg/dL). ------------------------------------------------------------------------------------------------------------------ No results for input(s): HGBA1C in the last 72 hours. ------------------------------------------------------------------------------------------------------------------ No results for input(s): CHOL, HDL, LDLCALC, TRIG, CHOLHDL, LDLDIRECT in the last 72 hours. ------------------------------------------------------------------------------------------------------------------ No results for input(s): TSH, T4TOTAL, T3FREE, THYROIDAB in the last 72 hours.  Invalid input(s): FREET3 ------------------------------------------------------------------------------------------------------------------ No results for input(s): VITAMINB12, FOLATE, FERRITIN, TIBC, IRON, RETICCTPCT in the last 72 hours.  Coagulation profile Recent Labs  Lab 08/12/17 1131  INR 1.01    No results for input(s): DDIMER in the last 72 hours.  Cardiac Enzymes Recent Labs  Lab 08/12/17 1131  TROPONINI 0.07*    ------------------------------------------------------------------------------------------------------------------ Invalid input(s): POCBNP    Assessment & Plan  Patient is a 77 year old admitted with aphasia   #1 aphasia suspect this is related to seizure MRI of the brain is negative for stroke Neurology has started patient on Depakote,750 mg po bid, Started Vimpat with 100mg  IV load then maintenance of 50mg  BID per Dr. Doy Mince. EEGreviewed by neurology Per Dr. Doy Mince, increase Vimpat to 75mg  BID  #2 diabetes type 2 Patient will need insulin on discharge Continue Lantus and pre-meal insulin with sliding scale AC and HS. Blood sugar stable  #3 Acute renal failure with dehydration.improved.  #4 hypertension, continue Hyzaar  #5 hyperlipidemia continue Lipitor PT: SNF I discussed with Dr. Doy Mince.    Code Status Orders  (From admission, onward)        Start     Ordered   08/12/17 1728  Do not attempt resuscitation (DNR)  Continuous    Question Answer Comment  In the event of cardiac or respiratory ARREST Do not call a "code blue"   In the event of cardiac or respiratory ARREST Do not perform Intubation, CPR, defibrillation or ACLS   In the event of cardiac or respiratory ARREST Use medication by any route, position, wound care, and other measures to relive pain and suffering. May use oxygen, suction and manual treatment of airway obstruction as needed for comfort.      08/12/17 1727    Code Status History    Date Active Date Inactive Code Status Order ID Comments User Context   09/02/2016 1536 09/05/2016 2126 Full Code [id number]  Radene Gunning, NP ED   02/29/2016 1533 03/02/2016 2108 Full Code [id number]  Samella Parr, NP Inpatient   09/22/2014 1406 09/25/2014 1717 Full Code [id number]  [name], MD Inpatient    Advance Directive Documentation     Most Recent Value  Type of Advance Directive  Living will  Pre-existing out of facility DNR order  (yellow form or pink MOST form)  -  "MOST" Form in Place?  -           Consults  356 min  DVT Prophylaxis  Lovenox  Lab Results  Component Value Date   PLT 247 08/12/2017     Time Spent in minutes    Greater than 50% of time spent in care coordination and counseling patient regarding the condition and plan of care.   Demetrios Loll M.D on 08/17/2017 at 2:42 PM  Between 7am to 6pm - Pager - [phone]  After 6pm go to [email or web address] - Librarian, academic  [phone]  

## 2017-08-18 DIAGNOSIS — E1165 Type 2 diabetes mellitus with hyperglycemia: Secondary | ICD-10-CM | POA: Diagnosis not present

## 2017-08-18 DIAGNOSIS — M109 Gout, unspecified: Secondary | ICD-10-CM | POA: Diagnosis not present

## 2017-08-18 DIAGNOSIS — R2689 Other abnormalities of gait and mobility: Secondary | ICD-10-CM | POA: Diagnosis not present

## 2017-08-18 DIAGNOSIS — Z8673 Personal history of transient ischemic attack (TIA), and cerebral infarction without residual deficits: Secondary | ICD-10-CM | POA: Diagnosis not present

## 2017-08-18 DIAGNOSIS — R569 Unspecified convulsions: Secondary | ICD-10-CM | POA: Diagnosis not present

## 2017-08-18 DIAGNOSIS — E785 Hyperlipidemia, unspecified: Secondary | ICD-10-CM | POA: Diagnosis not present

## 2017-08-18 DIAGNOSIS — E119 Type 2 diabetes mellitus without complications: Secondary | ICD-10-CM | POA: Diagnosis not present

## 2017-08-18 DIAGNOSIS — E876 Hypokalemia: Secondary | ICD-10-CM | POA: Diagnosis not present

## 2017-08-18 DIAGNOSIS — R739 Hyperglycemia, unspecified: Secondary | ICD-10-CM | POA: Diagnosis not present

## 2017-08-18 DIAGNOSIS — N179 Acute kidney failure, unspecified: Secondary | ICD-10-CM | POA: Diagnosis not present

## 2017-08-18 DIAGNOSIS — I10 Essential (primary) hypertension: Secondary | ICD-10-CM | POA: Diagnosis not present

## 2017-08-18 DIAGNOSIS — I639 Cerebral infarction, unspecified: Secondary | ICD-10-CM | POA: Diagnosis not present

## 2017-08-18 DIAGNOSIS — R130 Aphagia: Secondary | ICD-10-CM | POA: Diagnosis not present

## 2017-08-18 DIAGNOSIS — M6281 Muscle weakness (generalized): Secondary | ICD-10-CM | POA: Diagnosis not present

## 2017-08-18 LAB — VALPROIC ACID LEVEL: Valproic Acid Lvl: 119 ug/mL — ABNORMAL HIGH (ref 50.0–100.0)

## 2017-08-18 LAB — GLUCOSE, CAPILLARY
GLUCOSE-CAPILLARY: 176 mg/dL — AB (ref 65–99)
Glucose-Capillary: 210 mg/dL — ABNORMAL HIGH (ref 65–99)

## 2017-08-18 MED ORDER — LACOSAMIDE 50 MG PO TABS: 75.0000 mg | ORAL_TABLET | Freq: Two times a day (BID) | ORAL | 0 refills | Status: DC

## 2017-08-18 MED ORDER — ATORVASTATIN CALCIUM 20 MG PO TABS: 20.0000 mg | ORAL_TABLET | Freq: Every day | ORAL | 0 refills | Status: DC

## 2017-08-18 MED ORDER — ONETOUCH ULTRA SYSTEM W/DEVICE KIT: 1.0000 | PACK | Freq: Once | 0 refills | Status: AC

## 2017-08-18 MED ORDER — ONETOUCH DELICA LANCETS 33G MISC: 0 refills | Status: DC

## 2017-08-18 MED ORDER — VALPROATE SODIUM 250 MG/5ML PO SOLN: 500.0000 mg | Freq: Two times a day (BID) | ORAL | 0 refills | Status: DC

## 2017-08-18 MED ORDER — GLUCOSE BLOOD VI STRP
ORAL_STRIP | 0 refills | Status: DC
Start: 2017-08-18 — End: 2017-09-27

## 2017-08-18 NOTE — Discharge Summary (Signed)
[place] [facility] [place] NAME: Kristen [name]    MR#:  [id number]  DATE OF BIRTH:  Jan 08, 1941  DATE OF ADMISSION:  08/12/2017 ADMITTING PHYSICIAN: Demetrios Loll, MD  DATE OF DISCHARGE: No discharge date for patient encounter.  PRIMARY CARE PHYSICIAN: [name], MD    ADMISSION DIAGNOSIS:  Aphasia [R47.01] Hyperglycemia [R73.9] Altered mental status, unspecified altered mental status type [R41.82]  DISCHARGE DIAGNOSIS:  Active Problems:   Acute CVA (cerebrovascular accident) ([place])   Aphasia   SECONDARY DIAGNOSIS:   Past Medical History:  Diagnosis Date  . Abdominal aortic atherosclerosis ([place]) by CT 02/2014  . CAD (coronary artery disease)    by CT, per pt h/o MI  . Diabetes type 2, uncontrolled ([place])   . Frequent headaches   . GERD (gastroesophageal reflux disease)   . Gout   . History of pulmonary embolism 2012  . HLD (hyperlipidemia)   . HTN (hypertension)   . 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 Morbid obesity ([place])   . Myocardial infarction [facility]) 2012   per pt. report, states she was treated with medicine, here at [facility]    . Osteoarthritis    knees  . Primary localized osteoarthritis of left knee 09/22/2014  . Sleep apnea 2011   study done in [place], states that since she lost weight she doesn't use the CPAP any longer & she doesn't have a problem with sleep apnea  . Stroke [facility])    still has balance problem on occas. , uses cane but that's mainly for the left knee pain  . Syncope 03/01/2016  . Thoracic aortic atherosclerosis ([place]) 12/2015   by CXR  . Vertigo    hx. benign postitional postural    HOSPITAL COURSE:   Acute seizure Presented with aphasia  MRI of the brain was negative for stroke Controlled on Depakote 500 mg twice daily and Vimpat 75 mg twice daily Neurology to see patient while in house, EEG done    Chronic diabetes type 2 Controlled on current regiment  Acute renal failure with dehydration Resolved with IV fluids for rehydration  Chronic hypertension Stable on Hyzaar  Chronic hyperlipidemia  Stable on Lipitor  Disposition to skilled nursing facility status post discharge as recommended by physical therapy  DISCHARGE CONDITIONS:  On the day of discharge patient is afebrile, on exam stable, ready for discharge home with appropriate follow-up, for more specific details please see chart  CONSULTS OBTAINED:  Treatment Team:  Alexis Goodell, MD  DRUG ALLERGIES:   Allergies  Allergen Reactions  . Acetaminophen Other (See Comments)    "Causes me to spit up blood"  . Aleve [Naproxen Sodium] Other (See Comments)    Spits up blood  . Doxycycline Other (See Comments)    Malaise, GI upset, "felt drunk" and very ill  . Metformin And Related Other (See Comments)    Chills, dizziness  . Penicillins Rash    Has patient had a PCN reaction causing immediate rash, facial/tongue/throat swelling, SOB or lightheadedness with hypotension: Yes Has patient had a PCN reaction causing severe rash involving mucus membranes or skin necrosis: No Has patient had a PCN reaction that required hospitalization No Has patient had a PCN reaction occurring within the last 10 years: No If all of the above answers are "NO", then may proceed with Cephalosporin use.     DISCHARGE MEDICATIONS:  Allergies as of 08/18/2017      Reactions   Acetaminophen Other (See Comments)   "Causes me to spit up blood"   Aleve [naproxen Sodium] Other (See Comments)   Spits up blood   Doxycycline Other (See Comments)   Malaise, GI upset, "felt drunk" and very ill   Metformin And Related Other (See Comments)   Chills, dizziness   Penicillins Rash   Has patient had a PCN reaction causing immediate rash, facial/tongue/throat swelling, SOB or lightheadedness with hypotension: Yes Has patient had a PCN reaction  causing severe rash involving mucus membranes or skin necrosis: No Has patient had a PCN reaction that required hospitalization No Has patient had a PCN reaction occurring within the last 10 years: No If all of the above answers are "NO", then may proceed with Cephalosporin use.      Medication List    STOP taking these medications   glimepiride 4 MG tablet Commonly known as:  AMARYL   pioglitazone 30 MG tablet Commonly known as:  ACTOS   pioglitazone-glimepiride 30-2 MG tablet Commonly known as:  DUETACT   simvastatin 20 MG tablet Commonly known as:  ZOCOR     TAKE these medications   atorvastatin 40 MG tablet Commonly known as:  LIPITOR Take 1 tablet (40 mg total) by mouth daily at 6 PM. What changed:  You were already taking a medication with the same name, and this prescription was added. Make sure you understand how and when to take each.   atorvastatin 20 MG tablet Commonly known as:  LIPITOR Take 1 tablet (20 mg total) by mouth daily at 6 PM. What changed:  Another medication with the same name was added. Make sure you understand how and when to take each.   clopidogrel 75 MG tablet Commonly known as:  PLAVIX Take 1 tablet (75 mg total) by mouth daily.   clotrimazole 1 % cream Commonly known as:  LOTRIMIN AF Apply 1 application topically 2 (two) times daily. Rash under breast   Colchicine 0.6 MG Caps Commonly known as:  MITIGARE Take 1 tablet by mouth daily as needed.   glucose blood test strip Ck blood sugar twice a day and as directed.   hydrALAZINE 50 MG tablet Commonly known as:  APRESOLINE Take 1 tablet (50 mg total) by mouth every 8 (eight) hours.   insulin aspart 100 UNIT/ML injection Commonly known as:  novoLOG Inject 3 Units into the skin 3 (three) times daily with meals.   insulin glargine 100 UNIT/ML injection Commonly known as:  LANTUS Inject 0.15 mLs (15 Units total) into the skin at bedtime.   lacosamide 50 MG Tabs tablet Commonly  known as:  VIMPAT Take 1.5 tablets (75 mg total) by mouth 2 (two) times daily.   losartan-hydrochlorothiazide 100-12.5 MG tablet Commonly known as:  HYZAAR Take 1 tablet by mouth daily.   nitroGLYCERIN 0.4 MG SL tablet Commonly known as:  NITROSTAT Place 0.4 mg under the tongue every 5 (five) minutes as needed for chest pain.   ONE TOUCH ULTRA SYSTEM KIT w/Device Kit 1 kit by Does not apply route once for 1 dose.   ONETOUCH DELICA LANCETS 62Z Misc Check blood sugar twice a day and as directed.   potassium chloride 10 MEQ tablet Commonly known as:  K-DUR Take 1 tablet (10 mEq total) by mouth daily. What changed:  when to take this   Valproate Sodium 250 MG/5ML Soln solution Commonly known as:  DEPAKENE Take 10 mLs (500 mg  total) by mouth 2 (two) times daily.   Vitamin D 2000 units Caps Take 1 capsule by mouth daily.        DISCHARGE INSTRUCTIONS:  If you experience worsening of your admission symptoms, develop shortness of breath, life threatening emergency, suicidal or homicidal thoughts you must seek medical attention immediately by calling 911 or calling your MD immediately  if symptoms less severe.  You Must read complete instructions/literature along with all the possible adverse reactions/side effects for all the Medicines you take and that have been prescribed to you. Take any new Medicines after you have completely understood and accept all the possible adverse reactions/side effects.   Please note  You were cared for by a hospitalist during your hospital stay. If you have any questions about your discharge medications or the care you received while you were in the hospital after you are discharged, you can call the unit and asked to speak with the hospitalist on call if the hospitalist that took care of you is not available. Once you are discharged, your primary care physician will handle any further medical issues. Please note that NO REFILLS for any discharge  medications will be authorized once you are discharged, as it is imperative that you return to your primary care physician (or establish a relationship with a primary care physician if you do not have one) for your aftercare needs so that they can reassess your need for medications and monitor your lab values.    Today   CHIEF COMPLAINT:   Chief Complaint  Patient presents with  . Aphasia    HISTORY OF PRESENT ILLNESS:  77 y.o. female with a known history of multiple medical problems as below.  The patient is sent to ED from home due to above chief complaints.  The patient has recent stroke last year, has been on aspirin and Plavix.  Per her son, she patient was noticed to have aphasia and confusion intermittently since this morning.  She still has aphasia in the ED.  CAT scan of the head is unremarkable.  She has hyperglycemia at 532.     VITAL SIGNS:  Blood pressure (!) 144/80, pulse 76, temperature (!) 97.5 F (36.4 C), temperature source Oral, resp. rate 18, height _0  (1.626 m), weight 99.8 kg (220 lb), SpO2 100 %.  I/O:    Intake/Output Summary (Last 24 hours) at 08/18/2017 1138 Last data filed at 08/18/2017 0925 Gross per 24 hour  Intake 240 ml  Output 500 ml  Net -260 ml    PHYSICAL EXAMINATION:  GENERAL:  77 y.o.-year-old patient lying in the bed with no acute distress.  EYES: Pupils equal, round, reactive to light and accommodation. No scleral icterus. Extraocular muscles intact.  HEENT: Head atraumatic, normocephalic. Oropharynx and nasopharynx clear.  NECK:  Supple, no jugular venous distention. No thyroid enlargement, no tenderness.  LUNGS: Normal breath sounds bilaterally, no wheezing, rales,rhonchi or crepitation. No use of accessory muscles of respiration.  CARDIOVASCULAR: S1, S2 normal. No murmurs, rubs, or gallops.  ABDOMEN: Soft, non-tender, non-distended. Bowel sounds present. No organomegaly or mass.  EXTREMITIES: No pedal edema, cyanosis, or clubbing.   NEUROLOGIC: Cranial nerves II through XII are intact. Muscle strength 5/5 in all extremities. Sensation intact. Gait not checked.  PSYCHIATRIC: The patient is alert and oriented x 3.  SKIN: No obvious rash, lesion, or ulcer.   DATA REVIEW:   CBC Recent Labs  Lab 08/12/17 1131  WBC 11.6*  HGB 12.4  HCT 38.9  PLT 247    Chemistries  Recent Labs  Lab 08/12/17 1131  08/16/17 0427  NA 131*  --  135  K 4.3  --  3.4*  CL 95*  --  101  CO2 28  --  26  GLUCOSE 532*  --  210*  BUN 23*  --  16  CREATININE 1.35*   < > 0.89  CALCIUM 8.8*  --  8.7*  AST 31  --   --   ALT 16  --   --   ALKPHOS 85  --   --   BILITOT 1.3*  --   --    < > = values in this interval not displayed.    Cardiac Enzymes Recent Labs  Lab 08/12/17 1131  TROPONINI 0.07*    Microbiology Results  Results for orders placed or performed during the hospital encounter of 09/10/14  Surgical pcr screen     Status: None   Collection Time: 09/10/14 12:02 PM  Result Value Ref Range Status   MRSA, PCR NEGATIVE NEGATIVE Final   Staphylococcus aureus NEGATIVE NEGATIVE Final    Comment:        The Xpert SA Assay (FDA approved for NASAL specimens in patients over 51 years of age), is one component of a comprehensive surveillance program.  Test performance has been validated by [facility] for patients greater than or equal to 37 year old. It is not intended to diagnose infection nor to guide or monitor treatment.     RADIOLOGY:  No results found.  EKG:   Orders placed or performed during the hospital encounter of 08/12/17  . ED EKG  . ED EKG  . EKG 12-Lead  . EKG 12-Lead      Management plans discussed with the patient, family and they are in agreement.  CODE STATUS:     Code Status Orders  (From admission, onward)        Start     Ordered   08/12/17 1728  Do not attempt resuscitation (DNR)  Continuous    Question Answer Comment  In the event of cardiac or respiratory ARREST Do not  call a "code blue"   In the event of cardiac or respiratory ARREST Do not perform Intubation, CPR, defibrillation or ACLS   In the event of cardiac or respiratory ARREST Use medication by any route, position, wound care, and other measures to relive pain and suffering. May use oxygen, suction and manual treatment of airway obstruction as needed for comfort.      08/12/17 1727    Code Status History    Date Active Date Inactive Code Status Order ID Comments User Context   09/02/2016 1536 09/05/2016 2126 Full Code [id number]  Radene Gunning, NP ED   02/29/2016 1533 03/02/2016 2108 Full Code [id number]  Samella Parr, NP Inpatient   09/22/2014 1406 09/25/2014 1717 Full Code [id number]  [name], MD Inpatient    Advance Directive Documentation     Most Recent Value  Type of Advance Directive  Living will  Pre-existing out of facility DNR order (yellow form or pink MOST form)  -  "MOST" Form in Place?  -      TOTAL TIME TAKING CARE OF THIS PATIENT: 45 minutes.    Avel Peace Salary M.D on 08/18/2017 at 11:38 AM  Between 7am to 6pm - Pager - [phone]  After 6pm go to [email or web address] - password [email or web address] [place] [facility]  Office  [phone]  CC:  Primary care physician; [name], MD   Note: This dictation was prepared with Dragon dictation along with smaller phrase technology. Any transcriptional errors that result from this process are unintentional.

## 2017-08-18 NOTE — Progress Notes (Signed)
Patient discharged to [facility], transported by son. Madlyn Frankel, RN

## 2017-08-18 NOTE — Progress Notes (Signed)
Subjective: No seizures since yesterday   Objective: Current vital signs: BP (!) 144/80 (BP Location: Left Arm)   Pulse 76   Temp (!) 97.5 F (36.4 C) (Oral)   Resp 18   Ht 5\' 4"  (1.626 m)   Wt 220 lb (99.8 kg)   SpO2 100%   BMI 37.76 kg/m  Vital signs in last 24 hours: Temp:  [97.5 F (36.4 C)-98.3 F (36.8 C)] 97.5 F (36.4 C) (04/27 0432) Pulse Rate:  [74-92] 76 (04/27 0432) Resp:  [12-18] 18 (04/27 0432) BP: (142-179)/(61-80) 144/80 (04/27 0432) SpO2:  [99 %-100 %] 100 % (04/27 0432)  Intake/Output from previous day: 04/26 0701 - 04/27 0700 In: 0  Out: 1300 [Urine:1300] Intake/Output this shift: Total I/O In: 480 [P.O.:480] Out: -  Nutritional status: Seizure precautions Aspiration precautions Fall precautions DIET DYS 3 Room service appropriate? Yes with Assist; Fluid consistency: Thin Diet - low sodium heart healthy  Neurologic Exam: Mental Status: Alertand awake. Speech fluent without evidence of aphasia. Able to follow commands without difficulty. Cranial Nerves: II: Discs flat bilaterally; Visual fields grossly normal, pupils equal, round, reactive to light and accommodation III,IV, VI: ptosis not present, extra-ocular motions intact bilaterally V,VII: mild right facial droop, facial light touch sensation normal bilaterally VIII: hearing normal bilaterally IX,X: gag reflex present XI: bilateral shoulder shrug XII: midline tongue extension Motor: Able to lift all extremities against gravity    Lab Results: Basic Metabolic Panel: Recent Labs  Lab 08/12/17 1131 08/15/17 0501 08/16/17 0427  NA 131*  --  135  K 4.3  --  3.4*  CL 95*  --  101  CO2 28  --  26  GLUCOSE 532*  --  210*  BUN 23*  --  16  CREATININE 1.35* 0.91 0.89  CALCIUM 8.8*  --  8.7*    Liver Function Tests: Recent Labs  Lab 08/12/17 1131  AST 31  ALT 16  ALKPHOS 85  BILITOT 1.3*  PROT 7.1  ALBUMIN 3.7   No results for input(s): LIPASE, AMYLASE in the last 168  hours. No results for input(s): AMMONIA in the last 168 hours.  CBC: Recent Labs  Lab 08/12/17 1131  WBC 11.6*  NEUTROABS 9.4*  HGB 12.4  HCT 38.9  MCV 85.5  PLT 247    Cardiac Enzymes: Recent Labs  Lab 08/12/17 1131  TROPONINI 0.07*    Lipid Panel: Recent Labs  Lab 08/13/17 0439  CHOL 147  TRIG 91  HDL 46  CHOLHDL 3.2  VLDL 18  LDLCALC 83    CBG: Recent Labs  Lab 08/17/17 0754 08/17/17 1159 08/17/17 1701 08/17/17 2033 08/18/17 0744  GLUCAP 95 121* 189* 199* 176*    Microbiology: Results for orders placed or performed during the hospital encounter of 09/10/14  Surgical pcr screen     Status: None   Collection Time: 09/10/14 12:02 PM  Result Value Ref Range Status   MRSA, PCR NEGATIVE NEGATIVE Final   Staphylococcus aureus NEGATIVE NEGATIVE Final    Comment:        The Xpert SA Assay (FDA approved for NASAL specimens in patients over 15 years of age), is one component of a comprehensive surveillance program.  Test performance has been validated by [facility] for patients greater than or equal to 81 year old. It is not intended to diagnose infection nor to guide or monitor treatment.     Coagulation Studies: No results for input(s): LABPROT, INR in the last 72 hours.  Imaging:  No results found.  Medications:  I have reviewed the patient's current medications. Scheduled: . aspirin  300 mg Rectal Daily   Or  . aspirin  325 mg Oral Daily  . atorvastatin  40 mg Oral q1800  . clopidogrel  75 mg Oral Daily  . enoxaparin (LOVENOX) injection  40 mg Subcutaneous Q24H  . hydrALAZINE  50 mg Oral Q8H  . hydrochlorothiazide  12.5 mg Oral Daily  . insulin aspart  0-5 Units Subcutaneous QHS  . insulin aspart  0-9 Units Subcutaneous TID WC  . insulin aspart  3 Units Subcutaneous TID WC  . insulin glargine  20 Units Subcutaneous QHS  . lacosamide  75 mg Oral BID  . losartan  100 mg Oral Daily  . Valproate Sodium  500 mg Oral BID     Assessment/Plan: Last witnessed seizure yesterday morning.  On Depakote a with level of 119 and Vimpat that was increased to 75 BID  - Con't Depakote 500 BID that was decreased even though level is still 119 - Con't Vimpat 75 BUD - sz precautions - d/c planning    08/18/2017  10:30 AM

## 2017-08-18 NOTE — Clinical Social Work Note (Signed)
The patient will discharge today to [facility] via family transportation. The patient and the facility are aware and in agreement. The CSW has delivered the discharge packet and included a medical necessity form should family transport not be available. The CSW is signing off. Please consult should needs arise.  Santiago Bumpers, MSW, Latanya Presser [phone]

## 2017-08-18 NOTE — Progress Notes (Signed)
Report given to [place] at [facility]. Awaiting family to transport. Madlyn Frankel, RN

## 2017-08-20 ENCOUNTER — Other Ambulatory Visit: Payer: Self-pay | Admitting: *Deleted

## 2017-08-20 ENCOUNTER — Encounter: Payer: Medicare Other | Admitting: Family Medicine

## 2017-08-20 NOTE — Patient Outreach (Addendum)
[facility]) Care Management  08/20/2017  Nattaly Yebra Swire 05/28/1940 [id number]   View in EMR pt discharged to [facility] 08/18/17 after recent hospitalization  4/21-4/27/19 for Aphasia, Acute CVA, hyperglycemia.  Referral for [facility] LCSW done by  [facility] RN [facility] hospital liaison.   RN CM to follow up at discharge from SNF.     Zara Chess.   [place] [facility]  [phone]

## 2017-08-21 DIAGNOSIS — R739 Hyperglycemia, unspecified: Secondary | ICD-10-CM | POA: Diagnosis not present

## 2017-08-21 DIAGNOSIS — N179 Acute kidney failure, unspecified: Secondary | ICD-10-CM | POA: Diagnosis not present

## 2017-08-23 ENCOUNTER — Other Ambulatory Visit: Payer: Self-pay | Admitting: Family Medicine

## 2017-08-28 ENCOUNTER — Encounter: Payer: Self-pay | Admitting: *Deleted

## 2017-08-28 ENCOUNTER — Other Ambulatory Visit: Payer: Self-pay | Admitting: *Deleted

## 2017-08-28 ENCOUNTER — Ambulatory Visit: Payer: Self-pay | Admitting: *Deleted

## 2017-08-28 NOTE — Telephone Encounter (Signed)
This encounter was created in error - please disregard.

## 2017-08-28 NOTE — Patient Outreach (Signed)
[place] [facility]) Care Management  08/28/2017  Kristen [name] 14-May-1940 [id number]   Post Acute phone call to [facility], spoke with discharge planner Anit Harrington Challenger who stated that patient discharged home on 08/21/17 with [facility].    Plan: This social worker will inform RNCM of patient's discharge for follow up.  This Education officer, museum will follow up with patient to assess for community resource needs.   [name] [facility] Care Management [phone]

## 2017-08-28 NOTE — Patient Outreach (Signed)
[facility]) Care Management  08/28/2017  Kristen [name] 1940/07/25 [id number]   Follow up phone call to patient following her SNF stay from 08/12/17-08/17/17 at [facility]. Patient's phone rang, there was no answering machine to leave a message.   Plan: This Education officer, museum will send patient a contact letter and will follow up with a call within 2 days.   [name] [facility] Care Management [phone]

## 2017-08-29 ENCOUNTER — Other Ambulatory Visit: Payer: Self-pay | Admitting: *Deleted

## 2017-08-29 NOTE — Patient Outreach (Signed)
[facility]) Care Management  08/29/2017  Kristen [name] 03-Dec-1940 [id number]  Unsuccessful telephone encounter to [facility], 77 year old female- follow up on referral received 08/28/17 from [facility] LCSW for transition of care/recent SNF on 08/21/17.   Pt admitted to rehab after  Recent hospitalization April 22-27,2019 for Acute CVA, aphasia.  Pt's  History includes but not limited to Hypertension, DVT, DM type 2, CAD, Osteoarthritis, Gout.    Unable to leave voice message on pt's phone  After calling twice as phone kept ringing.   Also unable to contact son  [name] as his voice message not set up.    Plan:  Request  Richarda Osmond RN CM covering for this RN CM follow up again  Telephonically in the next 2-3 business days.     Addendum:  Spoke with [facility] LCSW, informed RN CM contact letter sent yesterday to pt, due to unable to contact.   Zara Chess.   [place] [facility] Management  [phone]

## 2017-08-30 ENCOUNTER — Other Ambulatory Visit: Payer: Self-pay | Admitting: *Deleted

## 2017-08-31 ENCOUNTER — Ambulatory Visit (INDEPENDENT_AMBULATORY_CARE_PROVIDER_SITE_OTHER): Payer: Medicare Other | Admitting: Family Medicine

## 2017-08-31 ENCOUNTER — Other Ambulatory Visit: Payer: Self-pay | Admitting: *Deleted

## 2017-08-31 ENCOUNTER — Encounter: Payer: Self-pay | Admitting: Family Medicine

## 2017-08-31 VITALS — BP 186/78 | HR 67 | Temp 98.3°F | Ht 64.0 in | Wt 206.0 lb

## 2017-08-31 DIAGNOSIS — I639 Cerebral infarction, unspecified: Secondary | ICD-10-CM

## 2017-08-31 DIAGNOSIS — R569 Unspecified convulsions: Secondary | ICD-10-CM | POA: Diagnosis not present

## 2017-08-31 DIAGNOSIS — Z09 Encounter for follow-up examination after completed treatment for conditions other than malignant neoplasm: Secondary | ICD-10-CM

## 2017-08-31 DIAGNOSIS — E1165 Type 2 diabetes mellitus with hyperglycemia: Secondary | ICD-10-CM | POA: Diagnosis not present

## 2017-08-31 DIAGNOSIS — I10 Essential (primary) hypertension: Secondary | ICD-10-CM

## 2017-08-31 LAB — BASIC METABOLIC PANEL
BUN: 16 mg/dL (ref 6–23)
CO2: 33 mEq/L — ABNORMAL HIGH (ref 19–32)
CREATININE: 1.09 mg/dL (ref 0.40–1.20)
Calcium: 9.4 mg/dL (ref 8.4–10.5)
Chloride: 102 mEq/L (ref 96–112)
GFR: 62.75 mL/min (ref >60.00)
Glucose, Bld: 155 mg/dL — ABNORMAL HIGH (ref 70–99)
Potassium: 3.9 mEq/L (ref 3.5–5.1)
Sodium: 142 mEq/L (ref 135–145)

## 2017-08-31 MED ORDER — LACOSAMIDE 50 MG PO TABS: 75.0000 mg | ORAL_TABLET | Freq: Two times a day (BID) | ORAL | 0 refills | Status: DC

## 2017-08-31 MED ORDER — BLOOD GLUCOSE MONITOR KIT: PACK | 0 refills | Status: DC

## 2017-08-31 NOTE — Patient Instructions (Addendum)
Please make sure you are using insulin glargine 15 units in the evening Do not take any more glipizide or pioglitazone (diabetes medicines by mouth) Please follow up in 2 weeks Please check your blood sugars 2 times daily- before your insulin shot and another time.  Bring your meter to your next visit    Insulin Injection Instructions, Single Insulin Dose, Adult A subcutaneous injection is a shot of medicine that is injected into the layer of fat between skin and muscle. People with type 1 diabetes must take insulin because their bodies do not make it. People with type 2 diabetes may need to take insulin. There are many different types of insulin. The type of insulin that you take may determine how many injections you give yourself and when you need to take the injections. Choosing a site for injection Insulin absorption varies from site to site. It is best to inject insulin within the same body area, using a different spot in that area for each injection. Do not inject the insulin in the same spot for each injection. There are five main areas that can be used for injecting. These areas include:  Abdomen. This is the preferred area.  Front of thigh.  Upper, outer side of thigh.  Back of upper arm.  Buttocks.  Using a syringe and vial: single insulin dose First, follow the steps for Getting Ready, then continue with the steps for Pushing Air Into the [place], then follow the steps for Filling the Syringe, and finish with the steps for Injecting the Insulin. Getting Ready  1. Wash your hands with soap and water. If soap and water are not available, use hand sanitizer. 2. Check the expiration date and type of insulin. 3. If you are using CLEAR insulin, check to see that it is clear and free of clumps. 4. Gently roll the medicine bottle (vial) between your palms to warm it. Do not shake the vial. 5. Remove the plastic pop-top covering from the vial, if one is present. 6. Use an alcohol wipe  to clean the rubber top of the vial. 7. Remove the plastic cover from the needle on the syringe. Do not let the needle touch anything. Pushing Air Into the [place]  1. Pull the plunger back to bring (draw up) air into the syringe. The amount of air should be the same as the number of units in a dose of single insulin. 2. While you keep the vial right-side up, poke the needle through the rubber top of the vial. Do not turn the vial upside down to do this. 3. Push the plunger in all the way. This pushes air into the vial. 4. Do not take the needle out of the vial yet. Filling the Syringe 1. With the needle still inserted in the vial, turn the vial upside down and hold it at eye level. 2. Slowly pull back on the plunger to draw up the desired number of insulin units into the syringe. 3. If you see air bubbles in the syringe, slowly move the plunger up and down 2 or 3 times to get rid of them. 4. Pull back the plunger until the syringe is filled to the correct dose. 5. Remove the needle from the vial. Do not let the needle touch anything. Injecting the Insulin  1. Use an alcohol wipe to clean the site where you will be injecting the needle. Let the site air-dry. 2. Hold the syringe in your writing hand like a pencil. 3. Use  your other hand to pinch and hold about an inch of skin. Do not directly touch the cleaned part of the skin. 4. Gently but quickly, put the needle straight into the skin. The needle should be at a 90-degree angle (perpendicular) to the skin, as if to form the letter "L." ? For example, if you are giving an injection in the abdomen, the abdomen forms one "leg" of the "L" and the needle forms the other "leg" of the "L." 5. For adults who have a small amount of body fat, the needle may need to be injected at a 45-degree angle instead. Your health care provider will tell you if this is necessary. ? A 45-degree angle looks like the letter "V." 6. Push the needle in as far as it will go  (to the hub). 7. When the needle is completely inserted into the skin, use the thumb of your writing hand to push the plunger down all the way to inject the insulin. 8. Let go of the skin that you are pinching. Continue to hold the syringe in place with your writing hand. 9. Wait 5 seconds, then pull the needle straight out of the skin. 10. Press and hold the alcohol wipe over the injection site until any bleeding stops. Do not rub the area. 11. Do not put the plastic cover back on the needle. 12. Discard the syringe and needle directly into a sharps container, such as an empty plastic bottle with a cover. Throwing away supplies   Discard all used needles in a puncture-proof sharps disposal container. You can ask your local pharmacy about where you can get this kind of disposal container, or you can use an empty liquid laundry detergent bottle that has a cover.  Follow the disposal regulations for the area where you live. Do not use any syringe or needle more than one time.  Throw away empty vials in the regular trash. What questions should I ask my health care provider?  How often should I be taking insulin?  How often should I check my blood glucose?  What amount of insulin should I be taking at each time?  What are the side effects?  What should I do if my blood glucose is too high?  What should I do if my blood glucose is too low?  What should I do if I forget to take my insulin?  What number should I call if I have questions? Where can I get more information?  American Diabetes Association (ADA): www.diabetes.org  American Association of Diabetes Educators (AADE) Patient Resources: https://www.diabeteseducator.org/patient-resources This information is not intended to replace advice given to you by your health care provider. Make sure you discuss any questions you have with your health care provider. Document Released: 05/14/2015 Document Revised: 09/16/2015 Document  Reviewed: 05/14/2015 Elsevier Interactive Patient Education  [employer].

## 2017-08-31 NOTE — Patient Outreach (Signed)
[facility]) Care Management [place] Coordination  08/31/2017  RAYMA HEGG 10-31-1940 [id number]  Received incoming care coordination call from [place], [facility] CSW re: Pippa Hanif, 77 y/o female referred to [facility] after recent hospitalization April 21-27, 2019 for CVA; patient was discharged from hospital to SNF for rehabilitation, and was subsequently discharged home from SNF.   Today, Chrystal confirmed that she has placed successful outreach call to patient, who declined all [facility] CM services, and asked that Chrystal request that [place] CM not contact her, as she is not interested in [facility] CM services.  Plan:  Will close [place] patient case, as patient has declined all [facility] CM services; will make patient's PCP and primary [facility] RN CCM Rose Pierzchala aware of same.  Oneta Rack, RN, BSN, [employer] [facility] Care Management  [phone]

## 2017-08-31 NOTE — Progress Notes (Signed)
Subjective:    Patient ID: LAUNA GOEDKEN, female    DOB: 11-16-1940, 77 y.o.   MRN: [id number]  HPI This is a 77 yo female who presents today for hospital follow up. She was admitted 08/12/17- 08/18/17 for aphasia, hyperglycemia, altered mental status and acute CVA. She was discharged to a SNF for 3 days where she reports she laid around all day and asked to be discharged to home. She lives alone, she is not driving. She is managing with ADLs. No falls. Occasional, transient light headedness.   She was discharged on insulin. She reports that she has been able to administer the insulin in her abdomen. Does not have glucometer, has used her son's. Yesterday her blood sugar was 160.   She does not have follow up with neurology. She was started on lacosamide and valproate sodium in the hospital.   [facility] has been trying to contact her to evaluate. Patient reports a nurse is coming to her home next week.   Past Medical History:  Diagnosis Date  . Abdominal aortic atherosclerosis ([place]) by CT 02/2014  . CAD (coronary artery disease)    by CT, per pt h/o MI  . Diabetes type 2, uncontrolled ([place])   . Frequent headaches   . GERD (gastroesophageal reflux disease)   . Gout   . History of pulmonary embolism 2012  . HLD (hyperlipidemia)   . HTN (hypertension)   . 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 Morbid obesity ([place])   . Myocardial infarction [facility]) 2012   per pt. report, states she was treated with medicine, here at [facility]    . Osteoarthritis    knees  . Primary localized osteoarthritis of left knee 09/22/2014  . Sleep apnea 2011   study done in [place], states that since she lost weight she doesn't use the CPAP any longer & she doesn't have a problem with sleep apnea  . Stroke [facility])    still has balance problem on occas. , uses cane but that's mainly for the left knee pain  . Syncope 03/01/2016  .  Thoracic aortic atherosclerosis ([place]) 12/2015   by CXR  . Vertigo    hx. benign postitional postural   Past Surgical History:  Procedure Laterality Date  . CATARACT EXTRACTION Bilateral 2013  . EYE SURGERY     /w IOL  . FOOT SURGERY Right   . PARTIAL HYSTERECTOMY     for fibroids, ovaries remain  . TONSILLECTOMY    . TOTAL KNEE ARTHROPLASTY Right 1990s  . TOTAL KNEE ARTHROPLASTY Left 09/22/2014   [name], MD  . TUBAL LIGATION     Family History  Problem Relation Age of Onset  . Cancer Mother        bone  . Diabetes Father   . Hypertension Father   . Cancer Son 31       lung  . Congenital heart disease Son 24  . Stroke Brother    Social History   Tobacco Use  . Smoking status: Never Smoker  . Smokeless tobacco: Never Used  . Tobacco comment: tobacco use- no   Substance Use Topics  . Alcohol use: Yes    Alcohol/week: 1.2 oz    Types: 1 Glasses of wine, 1 Cans of beer per week    Comment: Rare  . Drug use: No      Review of Systems Per HPI  Objective:   Physical Exam  Constitutional: She is oriented to person, place, and time. She appears well-developed and well-nourished. No distress.  HENT:  Head: Normocephalic and atraumatic.  Mouth/Throat: Oropharynx is clear and moist.  Eyes: Pupils are equal, round, and reactive to light. Conjunctivae and EOM are normal.  Neck: Normal range of motion. Neck supple.  Cardiovascular: Normal rate, regular rhythm and normal heart sounds.  Pulmonary/Chest: Effort normal and breath sounds normal.  Neurological: She is alert and oriented to person, place, and time. No cranial nerve deficit. Coordination normal.  Normal, unassisted gait. UE/LE strength 4/5 throughout. No focal deficits identified.   Skin: Skin is warm and dry. She is not diaphoretic.  Psychiatric: She has a normal mood and affect. Her behavior is normal. Judgment and thought content normal.  Vitals reviewed.     BP (!) 168/88 (BP Location: Right  Arm, Patient Position: Sitting, Cuff Size: Large)   Pulse 67   Temp 98.3 F (36.8 C) (Oral)   Ht '5\' 4"'  (1.626 m)   Wt 206 lb (93.4 kg)   SpO2 97%   BMI 35.36 kg/m  Wt Readings from Last 3 Encounters:  08/31/17 206 lb (93.4 kg)  08/12/17 220 lb (99.8 kg)  03/12/17 213 lb (96.6 kg)       Assessment & Plan:  1. Uncontrolled type 2 diabetes mellitus with hyperglycemia ([place]) - reviewed insulin administration and dose - provided written information regarding injection site - Basic Metabolic Panel - blood glucose meter kit and supplies KIT; Dispense based on patient and insurance preference. Use up to four times daily as directed. (FOR ICD-9 250.00, 250.01).  Dispense: 1 each; Refill: 0  2. Hypertension, uncontrolled - follow up in 2 weeks - Basic Metabolic Panel  3. Seizure ([place]) - unable to find neurology note for hospital stay, will refer her, not sure how long she should remain on anti-seizure meds - she was instructed not to drive - Valproic acid level - Ambulatory referral to Neurology - lacosamide (VIMPAT) 50 MG TABS tablet; Take 1.5 tablets (75 mg total) by mouth 2 (two) times daily.  Dispense: 90 tablet; Refill: 0  4. Cerebrovascular accident (CVA), unspecified mechanism ([place]) - Ambulatory referral to Neurology  5. Hospital discharge follow-up - provided written list of medications, reviewed administration of insulin, [facility] follow up   Clarene Reamer, FNP-BC  [facility] [place] [facility]  08/31/2017 4:36 PM

## 2017-08-31 NOTE — Patient Outreach (Addendum)
[facility]) Care Management  08/31/2017  Kristen [name] [birth year] [id number]   Phone call to patient today to assess for community resource needs following her SNF stay from [facility] from 08/12/17-08/17/17.  Patient had a recent hospitalization from 08/13/17-08/18/17 for Acute CV, aphasia.  Patient reached by phone today.  [facility] care management program discussed. However patient declined stating that she has positive family and friend support and can manage her own medical care. Her son provides transport to medical appointments and  per patient, she is able to take care of her own ADL's independently. Patient states that all of her care needs are met at this time. Patient declined RNCM involvement as well. This social worker encouraged patient to contact [facility] care management if any future needs arise.    Plan: Patient to be closed to [facility] care management          RNCM to be notified.   [name] [facility] Care Management [phone]

## 2017-09-01 LAB — VALPROIC ACID LEVEL: VALPROIC ACID LVL: 102.6 mg/L — AB (ref 50.0–100.0)

## 2017-09-08 ENCOUNTER — Other Ambulatory Visit: Payer: Self-pay | Admitting: Family Medicine

## 2017-09-10 NOTE — Telephone Encounter (Signed)
Looks like both meds were stopped after being d/c.  Pt has OV tomorrow.

## 2017-09-11 ENCOUNTER — Ambulatory Visit (INDEPENDENT_AMBULATORY_CARE_PROVIDER_SITE_OTHER): Payer: Medicare Other | Admitting: Family Medicine

## 2017-09-11 ENCOUNTER — Encounter: Payer: Self-pay | Admitting: Family Medicine

## 2017-09-11 VITALS — BP 164/90 | HR 67 | Temp 98.8°F | Ht 64.0 in | Wt 208.5 lb

## 2017-09-11 DIAGNOSIS — E1165 Type 2 diabetes mellitus with hyperglycemia: Secondary | ICD-10-CM | POA: Diagnosis not present

## 2017-09-11 DIAGNOSIS — E785 Hyperlipidemia, unspecified: Secondary | ICD-10-CM | POA: Diagnosis not present

## 2017-09-11 DIAGNOSIS — R4701 Aphasia: Secondary | ICD-10-CM

## 2017-09-11 DIAGNOSIS — I10 Essential (primary) hypertension: Secondary | ICD-10-CM | POA: Diagnosis not present

## 2017-09-11 DIAGNOSIS — Z8673 Personal history of transient ischemic attack (TIA), and cerebral infarction without residual deficits: Secondary | ICD-10-CM

## 2017-09-11 DIAGNOSIS — I639 Cerebral infarction, unspecified: Secondary | ICD-10-CM | POA: Diagnosis not present

## 2017-09-11 DIAGNOSIS — R569 Unspecified convulsions: Secondary | ICD-10-CM

## 2017-09-11 MED ORDER — AMLODIPINE BESYLATE 5 MG PO TABS: 5.0000 mg | ORAL_TABLET | Freq: Every day | ORAL | 3 refills | Status: DC

## 2017-09-11 NOTE — Progress Notes (Signed)
BP (!) 164/90 (BP Location: Right Arm, Cuff Size: Large)   Pulse 67   Temp 98.8 F (37.1 C) (Oral)   Ht _0  (1.626 m)   Wt 208 lb 8 oz (94.6 kg)   SpO2 97%   BMI 35.79 kg/m   On repeat 170/90  CC: 2 wk f/u visit Subjective:    Patient ID: Kristen [name], female    DOB: [birth year], 77 y.o.   MRN: [id number]  HPI: Kristen [name] is a 77 y.o. female presenting on 09/11/2017 for Hypertension (Here for 2 wk follow up. Says she has lost taste for water. Thinks it may be due to meds.)   Recent hospitalization for acute seizure with aphasia x2 days, seen in hosp f/u by Debbie 2 wks ago, note and hospital records reviewed. MRI negative for stroke. started on lacosamide 58m bid and depakote 5068mbid, EEG reviewed - abnormal. Hospital stay complicated by acute renal failure due to dehydration, resolved with IV fluids. Discharged to SNF x3 days, then sent home. [facility] and HH involved.   She is staying dizzy. Has lost taste for water. She is noticing some memory trouble. She is noticing some trouble with vision as well - due for eye exam.   Uncontrolled diabetes - discharged home with insulin glargine 15u in evenings. Now checking sugars once daily 3-4 pm - her niece is a nu[employer] scientistnd has been checking her sugars daily. She has continued taking amaryl 57m59maily although this was stopped at hospital.  bp remained uncontrolled. No HA, vision changes, CP/tightness, SOB, leg swelling.   She continues to avoid salt in diet, fried greasy foods. [facility]  She is not driving. Friend drove her here today.   Lab Results  Component Value Date   HGBA1C 13.1 (H) 08/13/2017     Relevant past medical, surgical, family and social history reviewed and updated as indicated. Interim medical history since our last visit reviewed. Allergies and medications reviewed and updated. Outpatient Medications Prior to Visit  Medication Sig Dispense Refill  . atorvastatin  (LIPITOR) 40 MG tablet Take 1 tablet (40 mg total) by mouth daily at 6 PM.    . blood glucose meter kit and supplies KIT Dispense based on patient and insurance preference. Use up to four times daily as directed. (FOR ICD-9 250.00, 250.01). 1 each 0  . Cholecalciferol (VITAMIN D) 2000 units CAPS Take 1 capsule by mouth daily.    . clopidogrel (PLAVIX) 75 MG tablet Take 1 tablet (75 mg total) by mouth daily. 90 tablet 3  . clotrimazole (LOTRIMIN AF) 1 % cream Apply 1 application topically 2 (two) times daily. Rash under breast 45 g 1  . Colchicine (MITIGARE) 0.6 MG CAPS Take 1 tablet by mouth daily as needed. 30 capsule 3  . glucose blood test strip Ck blood sugar twice a day and as directed. 100 each 0  . hydrALAZINE (APRESOLINE) 50 MG tablet Take 1 tablet (50 mg total) by mouth every 8 (eight) hours.    . insulin glargine (LANTUS) 100 UNIT/ML injection Inject 0.15 mLs (15 Units total) into the skin at bedtime. 10 mL 11  . KLOR-CON 10 10 MEQ tablet TAKE 1 TABLET (10 MEQ TOTAL) BY MOUTH EVERY MONDAY, WEDNESDAY, AND FRIDAY. 30 tablet 1  . lacosamide (VIMPAT) 50 MG TABS tablet Take 1.5 tablets (75 mg total) by mouth 2 (two) times daily. 90 tablet 0  . losartan-hydrochlorothiazide (HYZAAR) 100-12.5 MG tablet Take  1 tablet by mouth daily. 90 tablet 3  . nitroGLYCERIN (NITROSTAT) 0.4 MG SL tablet Place 0.4 mg under the tongue every 5 (five) minutes as needed for chest pain.    [name] DELICA LANCETS 62H MISC Check blood sugar twice a day and as directed. 100 each 0  . Valproate Sodium (DEPAKENE) 250 MG/5ML SOLN solution Take 10 mLs (500 mg total) by mouth 2 (two) times daily. 300 mL 0  . glimepiride (AMARYL) 4 MG tablet Take 4 mg by mouth daily with breakfast.     No facility-administered medications prior to visit.      Per HPI unless specifically indicated in ROS section below Review of Systems     Objective:    BP (!) 164/90 (BP Location: Right Arm, Cuff Size: Large)   Pulse 67   Temp  98.8 F (37.1 C) (Oral)   Ht _0  (1.626 m)   Wt 208 lb 8 oz (94.6 kg)   SpO2 97%   BMI 35.79 kg/m   Wt Readings from Last 3 Encounters:  09/11/17 208 lb 8 oz (94.6 kg)  08/31/17 206 lb (93.4 kg)  08/12/17 220 lb (99.8 kg)    Physical Exam  Constitutional: She appears well-developed and well-nourished. No distress.  HENT:  Mouth/Throat: Oropharynx is clear and moist. No oropharyngeal exudate.  Eyes: Pupils are equal, round, and reactive to light. EOM are normal.  Cardiovascular: Normal rate, regular rhythm and normal heart sounds.  No murmur heard. Pulmonary/Chest: Effort normal and breath sounds normal. No respiratory distress. She has no wheezes. She has no rales.  Musculoskeletal: She exhibits no edema.  Neurological: She is alert.  Walks with cane  Psychiatric: She has a normal mood and affect.  Nursing note and vitals reviewed.  Results for orders placed or performed in visit on 47/65/46  Basic Metabolic Panel  Result Value Ref Range   Sodium 142 135 - 145 mEq/L   Potassium 3.9 3.5 - 5.1 mEq/L   Chloride 102 96 - 112 mEq/L   CO2 33 (H) 19 - 32 mEq/L   Glucose, Bld 155 (H) 70 - 99 mg/dL   BUN 16 6 - 23 mg/dL   Creatinine, Ser 1.09 0.40 - 1.20 mg/dL   Calcium 9.4 8.4 - 10.5 mg/dL   GFR 62.75 >60.00 mL/min  Valproic acid level  Result Value Ref Range   Valproic Acid Lvl 102.6 (H) 50.0 - 100.0 mg/L      Assessment & Plan:   Problem List Items Addressed This Visit    Acute CVA (cerebrovascular accident) ([place])    Unclear if truly CVA or seizure - likely seizure only. H/o CVAs in the past. EEG was abnormal - see below. Has f/u with neurology this afternoon.       Relevant Medications   amLODipine (NORVASC) 5 MG tablet   Aphasia    For 2 days post seizure. No evidence of stroke on MRI. This has now resolved.       Diabetes type 2, uncontrolled ([place])    A1c 6.6% (02/2017) --> 13.1%! (07/2017).  Anticipate due to non adherence with antihyperglycemics.  She has  continued amaryl with her lantus despite this being stopped at discharge. Will stop, continue lantus alone for managing DM. RTC 2 wks with sugar log - advised to check twice daily and write down numbers to be able to titrate meds accordingly.       History of CVA (cerebrovascular accident)    Continue plavix 46m daily.  HLD (hyperlipidemia)    Simvastatin changed to atorvastatin 68m daily. Continue.       Relevant Medications   amLODipine (NORVASC) 5 MG tablet   Hypertension, uncontrolled    BP remains elevated despite compliance with losartan hctz 100/12.548mdaily. Will add amlodipine 65m165maily. RTC 2 wks close f/u.      Relevant Medications   amLODipine (NORVASC) 5 MG tablet   Seizure (HCC) - Primary    Abnormal EEG - L parietal region abnormality ?post-stroke. Now on vimpat 765m72md and depakote 500mg16m. F/u planned with neurology today. Aware to not drive for 6 months since latest seizure event.           Meds ordered this encounter  Medications  . amLODipine (NORVASC) 5 MG tablet    Sig: Take 1 tablet (5 mg total) by mouth daily.    Dispense:  30 tablet    Refill:  3   No orders of the defined types were placed in this encounter.   Follow up plan: Return in about 2 weeks (around 09/25/2017) for follow up visit.  JavieRia Bush

## 2017-09-11 NOTE — Patient Instructions (Addendum)
Start keeping sugar log and bring in to next appointment. Check once or twice daily - either before a meal or 2 hours after a meal. Continue current medicines. Also check blood pressures.  Stop amaryl (glimepiride) continue lantus 15 units daily.  Continue losartan hctz 100/12.5mg  daily, add amlodipine 5mg  once daily for blood pressure control. Watch for ankle swelling on this medicine.  Return in 2 weeks for f/u visit

## 2017-09-11 NOTE — Assessment & Plan Note (Signed)
For 2 days post seizure. No evidence of stroke on MRI. This has now resolved.

## 2017-09-11 NOTE — Assessment & Plan Note (Addendum)
Unclear if truly CVA or seizure - likely seizure only. H/o CVAs in the past. EEG was abnormal - see below. Has f/u with neurology this afternoon.

## 2017-09-11 NOTE — Assessment & Plan Note (Signed)
Continue plavix 75 mg daily.  

## 2017-09-11 NOTE — Assessment & Plan Note (Signed)
BP remains elevated despite compliance with losartan hctz 100/12.5mg  daily. Will add amlodipine 5mg  daily. RTC 2 wks close f/u.

## 2017-09-11 NOTE — Assessment & Plan Note (Signed)
A1c 6.6% (02/2017) --> 13.1%! (07/2017).  Anticipate due to non adherence with antihyperglycemics.  She has continued amaryl with her lantus despite this being stopped at discharge. Will stop, continue lantus alone for managing DM. RTC 2 wks with sugar log - advised to check twice daily and write down numbers to be able to titrate meds accordingly.

## 2017-09-11 NOTE — Assessment & Plan Note (Signed)
Simvastatin changed to atorvastatin 40mg  daily. Continue.

## 2017-09-11 NOTE — Assessment & Plan Note (Addendum)
Abnormal EEG - L parietal region abnormality ?post-stroke. Now on vimpat 75mg  bid and depakote 500mg  bid. F/u planned with neurology today. Aware to not drive for 6 months since latest seizure event.

## 2017-09-14 ENCOUNTER — Ambulatory Visit: Payer: Medicare Other | Admitting: Family Medicine

## 2017-09-25 ENCOUNTER — Ambulatory Visit: Payer: Medicare Other | Admitting: Family Medicine

## 2017-09-27 ENCOUNTER — Encounter: Payer: Self-pay | Admitting: Family Medicine

## 2017-09-27 ENCOUNTER — Ambulatory Visit (INDEPENDENT_AMBULATORY_CARE_PROVIDER_SITE_OTHER): Payer: Medicare Other | Admitting: Family Medicine

## 2017-09-27 ENCOUNTER — Other Ambulatory Visit: Payer: Self-pay | Admitting: Family Medicine

## 2017-09-27 VITALS — BP 126/82 | HR 78 | Temp 98.2°F | Ht 64.0 in | Wt 202.5 lb

## 2017-09-27 DIAGNOSIS — R569 Unspecified convulsions: Secondary | ICD-10-CM | POA: Diagnosis not present

## 2017-09-27 DIAGNOSIS — E1165 Type 2 diabetes mellitus with hyperglycemia: Secondary | ICD-10-CM | POA: Diagnosis not present

## 2017-09-27 DIAGNOSIS — I10 Essential (primary) hypertension: Secondary | ICD-10-CM

## 2017-09-27 DIAGNOSIS — N179 Acute kidney failure, unspecified: Secondary | ICD-10-CM | POA: Diagnosis not present

## 2017-09-27 NOTE — Assessment & Plan Note (Signed)
Marked improvement with addition of amlodipine. Continue this along with lisinopril, hctz and hydralazine.

## 2017-09-27 NOTE — Telephone Encounter (Signed)
Sent refill

## 2017-09-27 NOTE — Assessment & Plan Note (Signed)
Did not bring sugar log, has not been checking regularly. I cannot dose lantus at this time. Needs extensive diabetes teaching, declines referral to diabetes education classes at this time. Will see if we can set her up with [facility] care management for lantus assistance/cbg check education.  Personalized sugar log provided, I have asked her to start checking sugars daily with niece's help - try to check fasting or at different times of day (pre meal or 2 hours postprandial) and write down readings. I have asked her to bring sugar log in 1 week to review and titrate lantus accordingly.  RTC 1 mo f/u visit Consider restarting metformin as kidney function has stabilized.

## 2017-09-27 NOTE — Assessment & Plan Note (Signed)
Appreciate neuro care. States she has f/u 72hour EEG scheduled then f/u with neuro. Advised continue depakote and vimpat until seen by Dr [name].

## 2017-09-27 NOTE — Patient Instructions (Addendum)
Continue vimpat and depakote until you see Dr [name]. You should have a refill at the pharmacy.  Keep working on learning how to check sugars. I will ask [facility] to contact you again to establish care.  Start writing down sugars and when you checked them on log provided today, and then drop off log in 1 week.  Continue lantus 15 units at bedtime, we may increase dose depending on sugar readings.

## 2017-09-27 NOTE — Assessment & Plan Note (Signed)
This has resolved.

## 2017-09-27 NOTE — Telephone Encounter (Signed)
Dr. G patient.  

## 2017-09-27 NOTE — Progress Notes (Signed)
BP 126/82 (BP Location: Left Arm, Patient Position: Sitting, Cuff Size: Large)   Pulse 78   Temp 98.2 F (36.8 C) (Oral)   Ht '5\' 4"'  (1.626 m)   Wt 202 lb 8 oz (91.9 kg)   SpO2 98%   BMI 34.76 kg/m    CC: 2 wk DM f/u visit  Subjective:    Patient ID: Kristen [name], female    DOB: 14-Apr-1941, 77 y.o.   MRN: [id number]  HPI: Kristen [name] is a 77 y.o. female presenting on 09/27/2017 for Hypertension (Here for 2 wk follow-up. Wants to discuss Vimpat. If she is to continue, needs refill. Also, pt is asking about a nurse coming to the home to check about her insulin.)   Overall feeling well today.   Hospitalization 07/2017. [facility] contacted patient for care management services, this was declined by patient per [name] note 08/31/2017. Pt doesn't remember the phone call, states she would like [facility] services and would like their medication assistance with new lantus and new seizure medication Rx.   Recent hospitalization for acute seizure with aphasia, started on vimpat 67m bid and depakote 5044mbid. Saw neurology in follow up - rec 72 hour EEG then f/u with Dr PoMelrose NakayamaShe thinks she has these appointments scheduled.   HTN - amlodipine was added to her losartan hctz and hydralazine. Tolerating well. BP better controlled on current regimen.   DM - lantus 15u was added during recent hospitalization. Acute decompensation of sugar control likely due to nonadherence to prior diabetic regimen. Metformin was stopped due to ARF. Last insulin dosing was 2 nights ago - stopped because she was concerned about overtreatment. Denies low sugar readings or symptoms. Recall cbg 160-300s, unsure if fasting or postprandial. Due for diabetic eye exam. Foot exam today. Pneumonia shots previously completed. Doesn't think she's had DSME.  Diabetic Foot Exam - Simple   Simple Foot Form Diabetic Foot exam was performed with the following findings:  Yes 09/27/2017  1:06 PM  Visual Inspection No deformities, no ulcerations,  no other skin breakdown bilaterally:  Yes Sensation Testing Intact to touch and monofilament testing bilaterally:  Yes Pulse Check Posterior Tibialis and Dorsalis pulse intact bilaterally:  Yes Comments     Lab Results  Component Value Date   HGBA1C 13.1 (H) 08/13/2017     Relevant past medical, surgical, family and social history reviewed and updated as indicated. Interim medical history since our last visit reviewed. Allergies and medications reviewed and updated. Outpatient Medications Prior to Visit  Medication Sig Dispense Refill  . amLODipine (NORVASC) 5 MG tablet Take 1 tablet (5 mg total) by mouth daily. 30 tablet 3  . atorvastatin (LIPITOR) 40 MG tablet Take 1 tablet (40 mg total) by mouth daily at 6 PM.    . blood glucose meter kit and supplies KIT Dispense based on patient and insurance preference. Use up to four times daily as directed. (FOR ICD-9 250.00, 250.01). 1 each 0  . Cholecalciferol (VITAMIN D) 2000 units CAPS Take 1 capsule by mouth daily.    . clopidogrel (PLAVIX) 75 MG tablet Take 1 tablet (75 mg total) by mouth daily. 90 tablet 3  . clotrimazole (LOTRIMIN AF) 1 % cream Apply 1 application topically 2 (two) times daily. Rash under breast 45 g 1  . Colchicine (MITIGARE) 0.6 MG CAPS Take 1 tablet by mouth daily as needed. 30 capsule 3  . glucose blood (ONE TOUCH ULTRA TEST) test strip USE UP TO 4 TIMES DAILY  AS DIRECTED 100 each 3  . hydrALAZINE (APRESOLINE) 50 MG tablet Take 1 tablet (50 mg total) by mouth every 8 (eight) hours.    . insulin glargine (LANTUS) 100 UNIT/ML injection Inject 0.15 mLs (15 Units total) into the skin at bedtime. 10 mL 11  . KLOR-CON 10 10 MEQ tablet TAKE 1 TABLET (10 MEQ TOTAL) BY MOUTH EVERY MONDAY, WEDNESDAY, AND FRIDAY. 30 tablet 1  . losartan-hydrochlorothiazide (HYZAAR) 100-12.5 MG tablet Take 1 tablet by mouth daily. 90 tablet 3  . nitroGLYCERIN (NITROSTAT) 0.4 MG SL tablet Place 0.4 mg under the tongue every 5 (five) minutes as  needed for chest pain.    [name] DELICA LANCETS 57W MISC Check blood sugar twice a day and as directed. 100 each 0  . Valproate Sodium (DEPAKENE) 250 MG/5ML SOLN solution Take 10 mLs (500 mg total) by mouth 2 (two) times daily. 300 mL 0  . lacosamide (VIMPAT) 50 MG TABS tablet Take 1.5 tablets (75 mg total) by mouth 2 (two) times daily. (Patient not taking: Reported on 09/27/2017) 90 tablet 0   No facility-administered medications prior to visit.      Per HPI unless specifically indicated in ROS section below Review of Systems     Objective:    BP 126/82 (BP Location: Left Arm, Patient Position: Sitting, Cuff Size: Large)   Pulse 78   Temp 98.2 F (36.8 C) (Oral)   Ht '5\' 4"'  (1.626 m)   Wt 202 lb 8 oz (91.9 kg)   SpO2 98%   BMI 34.76 kg/m   Wt Readings from Last 3 Encounters:  09/27/17 202 lb 8 oz (91.9 kg)  09/11/17 208 lb 8 oz (94.6 kg)  08/31/17 206 lb (93.4 kg)    Physical Exam  Constitutional: She appears well-developed and well-nourished. No distress.  HENT:  Head: Normocephalic and atraumatic.  Right Ear: External ear normal.  Left Ear: External ear normal.  Nose: Nose normal.  Mouth/Throat: Oropharynx is clear and moist. No oropharyngeal exudate.  Eyes: Pupils are equal, round, and reactive to light. Conjunctivae and EOM are normal. No scleral icterus.  Neck: Normal range of motion. Neck supple.  Cardiovascular: Normal rate, regular rhythm, normal heart sounds and intact distal pulses.  No murmur heard. Pulmonary/Chest: Effort normal and breath sounds normal. No respiratory distress. She has no wheezes. She has no rales.  Musculoskeletal: She exhibits no edema.  See HPI for foot exam if done  Lymphadenopathy:    She has no cervical adenopathy.  Skin: Skin is warm and dry. No rash noted.  Psychiatric: She has a normal mood and affect.  Nursing note and vitals reviewed.  Results for orders placed or performed in visit on 62/03/55  Basic Metabolic Panel    Result Value Ref Range   Sodium 142 135 - 145 mEq/L   Potassium 3.9 3.5 - 5.1 mEq/L   Chloride 102 96 - 112 mEq/L   CO2 33 (H) 19 - 32 mEq/L   Glucose, Bld 155 (H) 70 - 99 mg/dL   BUN 16 6 - 23 mg/dL   Creatinine, Ser 1.09 0.40 - 1.20 mg/dL   Calcium 9.4 8.4 - 10.5 mg/dL   GFR 62.75 >60.00 mL/min  Valproic acid level  Result Value Ref Range   Valproic Acid Lvl 102.6 (H) 50.0 - 100.0 mg/L      Assessment & Plan:   Problem List Items Addressed This Visit    RESOLVED: Acute kidney injury ([name])    This has resolved  Relevant Orders   AMB Referral to [place] Management   Diabetes type 2, uncontrolled ([place]) - Primary    Did not bring sugar log, has not been checking regularly. I cannot dose lantus at this time. Needs extensive diabetes teaching, declines referral to diabetes education classes at this time. Will see if we can set her up with [facility] care management for lantus assistance/cbg check education.  Personalized sugar log provided, I have asked her to start checking sugars daily with niece's help - try to check fasting or at different times of day (pre meal or 2 hours postprandial) and write down readings. I have asked her to bring sugar log in 1 week to review and titrate lantus accordingly.  RTC 1 mo f/u visit Consider restarting metformin as kidney function has stabilized.      Relevant Orders   AMB Referral to [place] Management   Hypertension, uncontrolled    Marked improvement with addition of amlodipine. Continue this along with lisinopril, hctz and hydralazine.       Seizure [facility])    Appreciate neuro care. States she has f/u 72hour EEG scheduled then f/u with neuro. Advised continue depakote and vimpat until seen by Dr [name].       Relevant Orders   AMB Referral to [place] Management       No orders of the defined types were placed in this encounter.  Orders Placed This Encounter  Procedures  . AMB Referral to [place] Management    Referral  Priority:   Routine    Referral Type:   Consultation    Referral Reason:   [facility]-Care Management    Number of Visits Requested:   1    Follow up plan: Return in about 1 month (around 10/25/2017), or if symptoms worsen or fail to improve, for follow up visit.  [name], MD

## 2017-09-28 ENCOUNTER — Encounter: Payer: Self-pay | Admitting: *Deleted

## 2017-09-28 ENCOUNTER — Other Ambulatory Visit: Payer: Self-pay | Admitting: *Deleted

## 2017-09-28 DIAGNOSIS — Z9181 History of falling: Secondary | ICD-10-CM

## 2017-09-28 HISTORY — DX: History of falling: Z91.81

## 2017-09-28 NOTE — Patient Outreach (Addendum)
[place] [facility]) Care Management  09/28/2017  Kristen [name] November 20, 1940 [id number]   Telephone Screen  Referral Date: 09/27/17 Referral Source: MD referral from Dr [name] Referral Reason:recent stroke vs seizure, uncontrolled diabetes request SN for teaching and CM for assistance at home  Insurance: [place] health care medicare   Outreach attempt #1 Patient is able to verify HIPAA Reviewed and addressed referral to [facility] with patient Patient was active less than a month ago and declined services. PCP is aware and patient now would like services. Kristen [name] informed [facility] CM she needed CM to speak slowly and to give her moments to take in what is being said or asked.  She states she gets "so confused now since I had this stroke.", "Don't carry me so fast" She informed CM "if I repeat myself, be quiet and give me a break so I can get what you said." Kristen [name] states when she saw her MD on 09/27/17 she was told she had informed [facility] staff that she did not want services but confirms today she does not recall saying this because she was "so confused" after d/c in April 2019 and "has so many calls" She apologizes for refusing previous [facility] services and request to have staff come to see her or call her that understand how her stroke has affected her memory and thought process.    Kristen [name] states she wants someone to assist her with education and help her take her insulin shot using her pen and help her learn how to check her cbg with her glucose meter She confirms she has fallen x 2 without injury in last 3 months (once coming in door and once in her bedroom)  Social: Kristen [name] lives by herself but has the assist of her 2 sons, niece and grand children for appointments, and to assist with paying bills. She is independent with her ADLs and iADLs She has walk in shower with seat, States she has a cpap but is not using it, rolling walker, bedside commode, cane grab bars scales,  dentures  Conditions: CVA with aphasia and balance issues, DM 2 uncontrolled, CAD, GERD, Gout, HTN, Hyperlipidemia, morbid obesity, MI, sleep apnea, COPD per pt  Medications: Denies concerns with taking, affording and side effects of medications Reports her sons help with paying for medicine prn  Appointments: saw Dr [name] on 09/27/17 and has a 1 month f/u on 10/29/17 at [place]: Presently does not have and is interested in resources   Consent: [facility] RN CM reviewed [facility] services with patient. Patient gave verbal consent for services.  Plan: [facility] RN CM will refer Kristen [name] to [place] RN CM ( for disease management for uncontrolled DM, education on DME for DM, plus evaluate and treat for other possible arising concerns, fall risk) and [facility] SW (Advance directives, possible personal care services for assistance in the home, lives alone, interest in [place])  [place]. Lavina Hamman, RN, BSN, CCM [facility] Telephonic Care Management Care Coordinator Direct number [phone]  Main [facility] number [phone] Fax number [phone]

## 2017-10-01 ENCOUNTER — Other Ambulatory Visit: Payer: Self-pay | Admitting: *Deleted

## 2017-10-01 ENCOUNTER — Other Ambulatory Visit: Payer: Self-pay | Admitting: Family Medicine

## 2017-10-01 NOTE — Telephone Encounter (Signed)
Dr. G patient.  

## 2017-10-01 NOTE — Patient Outreach (Addendum)
[place] [facility]) Care Management  10/01/2017  Somaly Marteney Gelpi 30-Aug-1940 [id number]    Return phone from patient. This Education officer, museum discussed RNCM referral and role of Education officer, museum. Patient agrees to need for Advancded Directive, however due to her stroke would need assistance with completing the document. Per patient, she will be able to get the document notarized. Patient declined community resources for in home assistance stating that she can take care of her own ADL's and IADL's and has goof family support.  However she does need assistance with organizing her medications and would like more teaching on how to administer her insulin.  Per patient, she has applied for Medicaid in the past and due to her income did not qualify. She was approved for $25.00 in food stamps but turned it down. Patient is not interested in re-applying for medicaid stating doubt that she would qualify due to the amount of her personal assets. Patient continued to reiterate that she has a positive family and friend support group who are available to assist with her in home needs as well as transportation to her medical appointments.   Plan: This Education officer, museum will schedule a home visit to assist with the completion of her Advanced Directive. Late Entry patient referred to [facility] who will contact patient to assist with Advanced Directive completion.   [name] [facility] Care Management [phone]

## 2017-10-01 NOTE — Patient Outreach (Signed)
[place] [facility]) Care Management  10/01/2017  Kristen [name] 26-Sep-1940 [id number]   Patient referred to this social worker to assist with Advanced Directive completion as well as resources for in home care and Medicaid.   Patient's phone rang, no voicemail available.    [name] [facility] Care Management [phone]

## 2017-10-05 ENCOUNTER — Other Ambulatory Visit: Payer: Self-pay

## 2017-10-05 NOTE — Patient Outreach (Signed)
[place] [facility]) Care Management  10/05/2017  [name] June 09, 1940 [id number]  Successful outreach to the patient on today's date. HIPAA identifiers confirmed. BSW explained to the patient that CSW, [employer] had requested this BSW to assist patient in completing advanced directives. Patient agreeable to a home visit on Tuesday 6/18 at 3:00 pm. Patient requested this BSW to call Tuesday early afternoon to confirm availability due to patient remembering she has a physician appointment scheduled but unsure of the date or time.   BSW to contact patient Tuesday 6/18 prior to home visit.  Daneen Schick, Arita Miss, CDP Social Worker [phone]

## 2017-10-09 ENCOUNTER — Other Ambulatory Visit: Payer: Self-pay

## 2017-10-09 ENCOUNTER — Other Ambulatory Visit: Payer: Self-pay | Admitting: *Deleted

## 2017-10-09 ENCOUNTER — Encounter: Payer: Self-pay | Admitting: *Deleted

## 2017-10-09 NOTE — Patient Outreach (Signed)
[place] [facility]) Care Management  10/09/2017  Maysie Parkhill Tennis 05-08-1940 [id number]  BSW placed a call to the patient to confirm today's home visit as patient requested. Unfortunately the patient did not answer. BSW was unable to leave a voice message as the phone continually rang. BSW will attempt patient again later today. If unable to confirm patients availability, BSW will cancel patients home visit for today.  Daneen Schick, Arita Miss, CDP Social Worker [phone]

## 2017-10-09 NOTE — Patient Outreach (Signed)
[name]) Care Management [place] Telephone Outreach  10/09/2017  Kristen [name] Dec 06, 1940 [id number]  Successful telephone outreach to [facility], 77 y/o female referred to [place] by [facility] telephonic RN CM on original PCP referral; patient had recent hospitalization April 21-27, 2019 for acute CVA vs. seizure with subsequent expressive aphasia.  Patient has history including, but not limited to, DM- Type II with Hgb A1-C > 13; CAD with previous MI; COPD/ OSA; HTN/ HLD; GERD; and morbid obesity.  HIPAA/ identity verified with patient during phone call today; [place] RN CM role discussed with patient, and patient provides verbal consent for [facility] RN CCM involvement in her care.  Today, patient reports that she is doing "pretty good," and she denies pain, new/ recent falls, and sounds to be in no apparent/ obvious distress throughout phone call.    Patient further reports:  Medications: -- Has all medicationsand takes as prescribed;denies specific questions/ concerns about current medications, but states that she "gets confused easily" with medications; would like review of medications at time of [facility] RN CCM initial home visit, scheduled today for next week -- self-manage medications using weekly pill planner box. -- denies issues with swallowing medications  Provider appointments: -- Attended recent PCP office visit 09/27/17; verbalizes accurate understanding of post-visit instructions -- All upcoming provider appointments were reviewed with patient today; patient reports that friends and family provide transportation to provider appointments  Safety/ Mobility/ Falls: -- denies new/ recent falls, but admits to several falls in past; states that she fell each time due to "getting ahead of" herself, most recent fall due to "slippery floor after mopping" -- assistive devices: uses cane when ambulating outside of home; does not routinely use assistive  devices with ambulation inside of home -- general fall risks/ prevention education discussed with patient today-- patient was encouraged to continue using assistive devices and to take time with all ambulation  Social/ Community Resource needs: -- currently denies community resource needs, stating supportive family members that assist with care needs as indicated -- family provides transportation for patient to all provider appointments, errands, etc; however, patient acknowledged that she "might" need assistance with transportation in the future-- patient acknowledged that [place] will be visiting with her this afternoon, and I encouraged her to discuss potential transportation needs with [place] CSW; patient verbalized understanding and agreement.   Advanced Directive (AD) Planning:   --reports does not currently have exisisting AD in place, and that [facility] CSW will be visiting her this afternoon to assist with creation of same  Self-health management of recent CVA, DM with elevated A1-C: -- reports biggest health issue as "Diabetes control;" stated that she has recently started monitoring and recording blood sugars "2 or 3 times/ day;" on advice from PCP; reports sugars ranging from 200-300's consistently; fasting sugar this am:  199 -- is able to accurately verbalize current insulin dosing as compared to EMR -- verbalizes uncertainty around diet; states she "tries to eat healthy and limit sugar." -- unfamiliar with significance of A1-C; verbalizes desire to discuss further during [place] initial home visit -- continues to experience occasional challenges around aphasia secondary to recently diagnosed CVA-- states she believes this "is getting better," and reports she "does okay," as long as she "takes her time."  Patient denies further issues, concerns, or problems today.  I provided/ confirmed that patient has my direct phone number, the main [facility] CM office phone number, and the [facility] CM 24-hour  nurse advice phone number should issues arise prior to next scheduled [place] outreach.  Encouraged patient to contact me directly if needs, questions, issues, or concerns arise prior to next scheduled outreach with initial home visit next week; patient agreed to do so.  Plan:  Patient will take medications as prescribed and will attend all scheduled provider appointments  Patient will continue monitoring and recording daily blood sugars 2-3 times/ day  I will make patient's PCP aware of [facility] Community CM involvement in patient's care- will send barriers letter  [place] outreach to continue with scheduled home visit next week   [place] CM Care Plan Problem One     Most Recent Value  Care Plan Problem One  Self-health management deficits around chronic disease state of DM, as evidenced by patient reporting of same and ongoing elevated A1-C levels  Role Documenting the Problem One  Care Management [place] for Problem One  Active  [facility] Long Term Goal   Over the next 60 days, patient will verbalize 3 self-health management strategies for chronic disease state of DM with elevated A1-C levels, as evidenced by patient reporting of same during [place] outreach  [place] Term Goal Start Date  10/09/17  Interventions for Problem One Long Term Goal  Discussed with patient her current understanding of DM, current use of insulin, current habits around monitoring blood sugar levels at home,  discussed [facility] CCM program and scheduled initial home visit for next week  [facility] CM Short Term Goal #1   Over the next 30 days, patient will continue to monitor and record blood sugar readings at home at least twice daily, as evidenced by review of same during [place] outreach  [facility] CM Short Term Goal #1 Start Date  10/09/17  Interventions for Short Term Goal #1  Discussed with patient current habits/ routines aorund monitoring blood sugars at home, and encouraged patient to continue  monitoring and recording blood sugars for review during [facility] RN CCM home visit next week     Oneta Rack, RN, BSN, [employer] Coordinator [facility] Care Management  [phone]

## 2017-10-09 NOTE — Patient Outreach (Signed)
[name]) Care Management  [name]  [name] [birth year] [id number]  BSW met with patient at patients home to assist with completion of advanced directives. BSW reviewed the advanced directive packet with the patient as well as a MOST form. Patient completed advanced directive packet. Patient was unsure of her two sons addresses whom she named her healthcare agents. BSW requested patient complete this information before taking the document to be notarized. BSW explained to the patient that once the document is notarized she is to supply her healthcare agent and her primary physician with a copy. Patient able to verbalize understanding. BSW briefly reviewed the MOST form and suggested the patient take this to her next appointment with her primary physician in July to continue the discussion. Patient agreed.  During the visit the patient identified concerns with medications. Patient shared a letter with BSW the patient received from [place] the patient of tier 1 and tier 2 pharmacy benefits and ways to save money by using preferred pharmacies. Patient was unsure what the letter meant. Patient also mentioned difficulty in affording her medications stating "if it weren't for my son I would not have this medicine". Patient then pointed to her lantus pen. Patient also stated she has difficulty affording her diabetic supplies including lancets. BSW recommended a referral to [facility] Pharmacist for assistance with pharmacy benefits as well as exploring possible programs the patient may qualify for. Patient in agreement. Patient disclosed her monthly income which is over the Medicaid limit. BSW explained there may be other benefits she qualifies for and a pharmacist could contact her to discuss further.  Patient also stated she needs assistance with transportation to her medical appointments. BSW discussed the transportation benefit through [employer]. Patient is  unaware of this program. BSW attempted to assist patient in contacting [employer] to arrange transportation for her neurology appointment on Friday 6/21. Patient declined stating she already arranged a ride through a friend. Patient informed this BSW that the patient will obtain a monitor to be worn on her head the entire weekend to establish "how much damage the episode caused". Patient stated she had a seizure like event and they are "unsure" what it did to her brain.   Plan:  BSW to mail patient a Musician. BSW to contact patient next week to determine upcomming transportation needs. BSW will also assist patient in contacting [employer] to introduce patient to this service. Patient stated that if she does not answer to leave a voice message revealing reason for the call. Patient stated she gets several "scam calls" and often does not answer numbers she does not recognize.  Daneen Schick, Arita Miss, CDP Social Worker [phone]

## 2017-10-11 ENCOUNTER — Other Ambulatory Visit: Payer: Self-pay | Admitting: Pharmacist

## 2017-10-11 NOTE — Patient Outreach (Signed)
[facility]) Care Management  10/11/2017  Ryder Chesmore Forney 1941-04-17 [id number]   77 y.o. year old female referred to [place] for Medication Management (Initial pharmacy review) Referred by Daneen Schick, [facility] BSW  Was able to reach patient via telephone today but unfortunately patient stated she didn't have time to talk. Unsuccessfully attempted to schedule an appointment with patient. (unsuccessful outreach #1).  Plan: Will follow-up within 2-4  business days via telephone.   [name], PharmD Clinical Pharmacist, [name] [phone]

## 2017-10-12 DIAGNOSIS — R569 Unspecified convulsions: Secondary | ICD-10-CM | POA: Diagnosis not present

## 2017-10-13 DIAGNOSIS — R569 Unspecified convulsions: Secondary | ICD-10-CM | POA: Diagnosis not present

## 2017-10-14 DIAGNOSIS — R569 Unspecified convulsions: Secondary | ICD-10-CM | POA: Diagnosis not present

## 2017-10-15 ENCOUNTER — Other Ambulatory Visit: Payer: Self-pay | Admitting: Pharmacist

## 2017-10-15 ENCOUNTER — Ambulatory Visit: Payer: Self-pay | Admitting: Pharmacist

## 2017-10-15 NOTE — Patient Outreach (Signed)
[facility]) Care Management  10/15/2017  Kristen [name] 23-Jul-1940 [id number]   77 y.o. year old female referred to [place] for Medication Management (Initial pharmacy outreach attempt #2)   Was unable to reach patient via telephone today and have left HIPAA compliant voicemail asking patient to return my call. (unsuccessful outreach #2). Phone number that I dialed, matching patient's home phone number listed in chart, returned my call and stated that it was the "[place] residence". Apologized for the confusion.   Plan: Will  send unsuccessful outreach letter and allow patient 10 days to respond prior to closing case. Will get in touch with other team members that have had successful outreach to patient and double check contact information. Possibly attend home visit with [name], [facility] RNCM, this week.   [name], PharmD Clinical Pharmacist, [facility] [phone]

## 2017-10-16 ENCOUNTER — Other Ambulatory Visit: Payer: Self-pay

## 2017-10-16 NOTE — Patient Outreach (Signed)
[facility]) Care Management  10/16/2017  Kristen [name] 12-24-1940 [id number]   Successful outreach to the patient, HIPAA identifiers confirmed. Patient able to verify receipt of [employer] brochure. BSW offered to assist patient in scheduling transportation to [place] physician appointments. Patient unsure of dates and times. BSW requested patient contact her physician office to verify upcomming appointment specifics. BSW will contact patient Thursday, as requested by the patient, to assist with transportation arrangements.  Daneen Schick, BSW, CDP [facility] [phone]

## 2017-10-17 ENCOUNTER — Other Ambulatory Visit: Payer: Self-pay | Admitting: Pharmacist

## 2017-10-17 ENCOUNTER — Encounter: Payer: Self-pay | Admitting: *Deleted

## 2017-10-17 ENCOUNTER — Other Ambulatory Visit: Payer: Self-pay | Admitting: *Deleted

## 2017-10-17 NOTE — Patient Outreach (Signed)
[facility]) Care Management  [place]   10/17/2017  Oasis Goehring Batterson 03-12-1941 [id number]  Subjective:  77 y.o. year old female referred to [facility] for Medication Management (Initial pharmacy home visit)  Referred by Daneen Schick, [facility] BSW, for questions regarding her Medicare Part D benefits and medication affordability issues with diabetic testing supplies and Lantus.   PMH s/f: HTN, CAD, atherosclerosis, s/p stroke, type 2 diabetes, vertigo, osteoarthritis, HLD, obesity, history of DVT and PE, memory deficit, gout, seizure  Patient with [employer] plan.   Patient confirms identity with HIPAA-identifiers x2 and gives verbal consent to speak about medications. Home visit was conducted with [name], [place].    Subjective:   Medication Adherence: Checking patient's fill history, she appears pretty compliant with her medications. Of note, significant amount of hydralazine in bottle (patient should be nearly out based on fill date).   Medication Assistance:  Patient states that her Lantus pens are $45 and she had to pay cash price for her diabetes testing supplies.   Medication Management:  Patient manages her own medications. She is very organized. She uses a pill box but instead of sorting medications by day, she uses each cavity of the pill box to hold pills. She likes to keep at least one tablet in the prescription bottles to help her refill her pill tray. This process, while at first glance complicated, seems to work well for the patient.   Objective:   Encounter Medications: Outpatient Encounter Medications as of 10/17/2017  Medication Sig Note  . atorvastatin (LIPITOR) 40 MG tablet Take 1 tablet (40 mg total) by mouth daily at 6 PM. 10/17/2017: Patient had some older simvastatin she was also taking  . blood glucose meter kit and supplies KIT Dispense based on patient and insurance preference. Use up to four times  daily as directed. (FOR ICD-9 250.00, 250.01).   Marland Kitchen clopidogrel (PLAVIX) 75 MG tablet Take 1 tablet (75 mg total) by mouth daily.   . clotrimazole (LOTRIMIN AF) 1 % cream Apply 1 application topically 2 (two) times daily. Rash under breast   . Colchicine (MITIGARE) 0.6 MG CAPS Take 1 tablet by mouth daily as needed. 10/17/2017: Patient is taking daily, regardless of gout flare. Most recent uric acid level was normal.    . glucose blood (ONE TOUCH ULTRA TEST) test strip USE UP TO 4 TIMES DAILY AS DIRECTED   . hydrALAZINE (APRESOLINE) 50 MG tablet Take 1 tablet (50 mg total) by mouth every 8 (eight) hours. (Patient taking differently: Take 50 mg by mouth daily. )   . insulin glargine (LANTUS) 100 UNIT/ML injection Inject 0.15 mLs (15 Units total) into the skin at bedtime. 10/17/2017: Patient reported taking on 5 U qHS-- instructed patient using teach back on correct dosing of 15 Units qHS-- patient able to verbalize correct/ accurate dosing after [place] RN CM home visit-- to inform Dr. [name]  . KLOR-CON 10 10 MEQ tablet TAKE 1 TABLET (10 MEQ TOTAL) BY MOUTH EVERY MONDAY, WEDNESDAY, AND FRIDAY. 10/17/2017: Patient also taking potassium chloride 72mq daily prescribed by Dr. STamala Julian . lacosamide (VIMPAT) 50 MG TABS tablet Take 1.5 tablets (75 mg total) by mouth 2 (two) times daily.   .Marland Kitchenlosartan-hydrochlorothiazide (HYZAAR) 100-12.5 MG tablet Take 1 tablet by mouth daily.   . nitroGLYCERIN (NITROSTAT) 0.4 MG SL tablet Place 0.4 mg under the tongue every 5 (five) minutes as needed for chest pain. 10/17/2017: Has not needed   .  ONETOUCH DELICA LANCETS FINE MISC USE U PTO 4 TIMES DAILY AS DIRECTED   . amLODipine (NORVASC) 5 MG tablet Take 1 tablet (5 mg total) by mouth daily. (Patient not taking: Reported on 10/17/2017)   . Cholecalciferol (VITAMIN D) 2000 units CAPS Take 1 capsule by mouth daily.   . Valproate Sodium (DEPAKENE) 250 MG/5ML SOLN solution Take 10 mLs (500 mg total) by mouth 2 (two) times  daily. (Patient not taking: Reported on 10/17/2017) 10/17/2017: Patient states was Dc'ed    No facility-administered encounter medications on file as of 10/17/2017.     Functional Status: In your present state of health, do you have any difficulty performing the following activities: 08/12/2017  Hearing? N  Vision? N  Difficulty concentrating or making decisions? Y  Walking or climbing stairs? Y  Dressing or bathing? Y  Doing errands, shopping? Y  Some recent data might be hidden    Fall/Depression Screening: Fall Risk  10/17/2017 10/09/2017 09/28/2017  Falls in the past year? (No Data) Yes Yes  Comment Denies new/ recent falls - -  Number falls in past yr: - 2 or more 2 or more  Comment - - -  Injury with Fall? - No No  Risk Factor Category  - High Fall Risk High Fall Risk  Risk for fall due to : - History of fall(s);Impaired balance/gait;Other (Comment) History of fall(s);Impaired balance/gait;Other (Comment)  Risk for fall due to: Comment - recent CVA recent stroke in April 2019   Follow up Falls evaluation completed;Falls prevention discussed Falls prevention discussed Falls prevention discussed   PHQ 2/9 Scores 10/17/2017 09/28/2017 08/31/2017 08/09/2016 03/10/2015 12/11/2013  PHQ - 2 Score 0 1 0 0 0 0   Assessment:  Drugs sorted by system:  Neurologic/Psychologic: lacosamide  Cardiovascular: atorvastatin, clopidogrel, hydralazine, losartan-HCTZ, nitroglycerin   Endocrine: Lantus  Topical: clotrimazole  Vitamins/Minerals: potassium chloride  Miscellaneous: colchicine  Drugs sorted by problem: HTN: losartan-HCTZ, hydralazine, amlodipine CAD, atherosclerosis: atorvastatin, nitroglycerin, amldoipine s/p stroke: clopidogrel type 2 diabetes: Lantus Osteoarthritis: HLD: atorvastatin Obesity: history of DVT and PE: memory deficit: Gout: colchicine Seizure: lacosamide Unknown: potassium chloride   Duplications in therapy: patient had both potassium chloride and Klor-Con  23mq and was taking both medications Gaps in therapy: ASA for secondary prevention in diabetes patients Other issues noted: cost concern for diabetes testing supplies; still taking simvastatin (DC'ed in April and started on atorvastatin), hasn't been taking amlodipine; has been taking hydralazine once daily  Plan: 1. Counseled patient on Medicare Part D and her UHC benefits, including mail order pharmacy (the letter she got in the mail) and that Tier 1 and Tier 2 generics [facility] are free. Patient is hesitant to use mail order pharmacy and unsure about getting three month supplies at a time. [facility] Patient is aware of O[facility] catalog and likes using it.  2. Noted that patient was taking both Klor-Con and potassium chloride (prescribed by Dr. GDarnell Leveland Dr. STamala Julian. Removed the daily potassium chloride and kept the MWF KlorCon as that was the more recent medication in patient's chart. Seek clarification from Dr. GDarnell Level   3. Noted that patient was taking simvastatin and not atorvastatin. Removed patient's simvastatin from her refill box and filled with atorvastatin.  4. Patient has been taking colchicine daily and has not had any gout flares. Uric Acid levels have been normal since 2017. Counseled patient on when to use colchicine PRN and recommended following up with Dr. GDarnell Level  5. Counseled  patient ask her preferred pharmacy ([facility]) to bill her testing supplies under Medicare Part B and that she may need to take her Medicare card to the pharmacy. Patient verbalized understanding.  6. Will update Dr. Darnell Level on the above issues: potassium, hydralazine once daily, not taking amlodipine, taking colchicine daily.  7. Follow up with Dr. Darnell Level in 2-4 business days (prior to patient's July 8 appointment) and follow up with patient thereafter. Scheduled co-home visit with [name], [place] for July 10.   [name], PharmD Clinical Pharmacist, [facility] [phone]

## 2017-10-17 NOTE — Patient Outreach (Signed)
[facility] ([facility]) Care Management  [facility] Community CM Initial Home Visit Care Coordination outreach x 3 10/17/2017  Kristen [name] 08/06/1940 [id number]  [name] is a 77 y.o. female referred to [facility] Community CM by [facility] telephonic RN CM on original PCP referral; patient had recent hospitalization April 21-27, 2019 for acute CVA vs. seizure with subsequent expressive aphasia.  Patient has history including, but not limited to, DM- Type II with Hgb A1-C > 13; CAD with previous MI; COPD/ OSA; HTN/ HLD; GERD; and morbid obesity.  HIPAA/ identity verified with patient in person at her home today; [facility] Community CM services were again discussed with patient, and written consent for [facility] CM services was obtained.  Patient is alone at her home during home visit; pleasant 120 minute home visit, with [facility] Pharmacist Julie Hedrick present for last portion of visit.  Today, patient reports that she is doing "real good," and she denies pain, new/ recent falls, and is in no apparent/ obvious distress throughout home visit.    Subjective: "I have had some trouble with periods of confusion after my stroke; I can't always find the words I want to use; I think I am doing real good, but I just need help managing my blood sugars and my medicines, so I know I am doing what I am supposed to."  Assessment:  [name] appears to be recuperating well after her recent hospitalization.  [name] reports a supportive family network that assist with her care as indicated.  [name] is essentially independent in her daily care but requires assistance with transportation, as her sons work; [facility] BSW currently involved and assisting with transportation needs through patient's insurance benefits.  [name] reports periods of confusion around finding her words, and she admits that she has been taking her prescribed insulin dosing incorrectly; [facility] Pharmacist involved in patient's care for reinforcement of patient's current medications,  as she described confusion around recent changes post-hospital discharge.  [name] is motivated to increase her knowledge around self-health management of chronic disease state of DM and could benefit from focused education around same.  Patient further reports:  Medications: -- Has all medicationsand takes as prescribed;denies specific questions/ concerns about current medications, but again states that she "gets confused easily" with medications; [facility] Pharmacist Julie Hedrick present for last portion of home visit today; all medications were reviewed with patient thoroughly by [facility] Pharmacist-- please see [facility]N pharmacist note -- discovered prior to [facility] Pharmacist arrival at patient's home that patient has not been taking her prescribed Lantus insulin correctly-- patient reported that she has been taking 5 units qHS-- dose is written for 15 units qHS-- using teach back method, reviewed PCP orders with patient, and instructed her on correct use of lantus pen: patient is able to accurately verbalize correct dosing of Lantus pen by end of home visit, and she agrees to begin using 15 units qHS, starting tonight -- self-manage medications using weekly pill planner box -- denies issues with swallowing medications  Provider appointments: -- Attended recent PCP office visit 09/27/17; after counseling on correct use of Lantus dosing, verbalizes accurate understanding of post-visit instructions -- All upcoming provider appointments were reviewed with patient today; patient reports that friends and family provide transportation to provider appointments -- Patient reports that she does not have scheduled neurology appointment post- outpatient EEG recently completed:   Care Coordination outreach placed to neurology provider x 2: 1) 1:50 pm:  Called Dr. Potter's office ([phone]) and confirmed with [name] that patient   has scheduled neurology follow up appointment on October 29, 2017 at 53:66-- this conflicts with  scheduled PCP office visit; spoke with patient who agrees that she prefers to attend PCP office visit as scheduled and to re-schedule neurology office visit 2) 2:05 pm: Called Dr. Lannie Fields office back to re-schedule current appointment; spoke with [name]-- re-schedule was completed-- new office visit scheduled with patient's permission for October 30, 2017 at 1:30  Patient's home calendar was updated to reflect all upcoming scheduled provider appointments, and patient was encouraged to maintain contact with [facility] BSW for assistance in scheduling transportation through her insurance provider as offered benefit  Safety/ Mobility/ Falls: -- denies new/ recent falls, uses cane "only when" she goes out of home -- assistive devices: uses cane when ambulating outside of home; does not routinely use assistive devices with ambulation inside of home -- patient demonstrates purposeful, steady gait with ambulation around her home, and there are no obvious fall hazards in her home-- no clutter/ rugs, etc -- general fall risks/ prevention education discussed with patient today-- patient was encouraged to continue using assistive devices and to take time with all ambulation  Social/ Community Resource needs: -- continues to deny community resource needs, stating supportive family members that assist with care needs/ driving for errands as indicated -- 2 local sons; 2 sons who live out of town; had 2 sons who have passed away; patient is widow and lives alone -- patient acknowledged that [place] is assisting her with transportation to provider appointments using insurance benefit--  encouraged her to maintain contact/ communication with [facility] BSW; patient verbalized understanding and agreement.   Care Coordination outreach:  2:40 pm: contacted Daneen Schick, [facility] BSW to inform of re-scheduled neurology appointment and to confirm PCP appointment- left detailed voice mail message  Advanced Directive (AD) Planning:    --again reports does not currently have exisisting AD in place, and acknowledges that [place] previously visited her to assist with creation of same -- AD planning packet reviewed with patient: patient reports that her son will be transporting her "this afternoon" to have documents notarized; positive reinforcement provided; encouraged patient to take notarized documents to upcoming PCP office visit, patient agrees to do so  Self-health management of recent CVA, DM with elevated A1-C: -- confirms that she has recently started monitoring and recording blood sugars "2 or 3 times/ day;" on advice from PCP; reports sugars ranging from 200-300's consistently; fasting sugar this am:  260 -- recorded blood sugars reviewed with patient-- patient has inconsistently monitored/ recorded her blood sugars, but when she has recorded, values range from 193-447; from the paper she is recording values on, it is difficult to tell whether blood sugars are fasting/ when they are taken on any given day: we created a new chart (provided at PCP office visit) for her to monitor/ record blood sugars-- and I reviewed this with patient thoroughly and encouraged her to take with her to upcoming PCP office visit, which she agreed to do -- patient demonstrated blood sugar check during home visit: patient performs blood sugar check accurately/ appropriately -- discussed basics of healthy diet in setting of DM: provided and discussed printed educatinal material on same, and encouraged patient to review:  Patient reports that she limits sugars and "eats healthy" -- unfamiliar with significance of A1-C; patient appears overwhelmed with provision of education/ medication review during home visit today-- verbalizes agreement to discuss further during next [place] home visit, which was scheduled today -- continues  to experience occasional challenges around aphasia secondary to recently diagnosed CVA-- states she believes this "is  getting better," and reports she "does okay," as long as she "takes her time."  Patient does not appear to struggle with this today during [facility] Community CM home visit; patient was encouraged to take her time and not to rush her speech/ thought processes; benefits of BP control for stroke prevention discussed with patient: patient's BP initially at visit was elevated, by end of visit, this had improved.  Patient denies further issues, concerns, or problems today. I provided/ confirmed that patient hasmy direct phone number, the main [facility] CM office phone number, and the [facility] CM 24-hour nurse advice phone number should issues arise prior to next scheduled [facility] Community CM outreach.  Encouraged patient to contact me directly if needs, questions, issues, or concerns arise prior to next scheduled outreach and patient agreed to do so.  Objective:    BP 132/82   Pulse 67   Resp 18   SpO2 96%   BP initially 178/88  Review of Systems  Constitutional: Negative.        Obese  Respiratory: Negative.  Negative for cough, shortness of breath and wheezing.   Cardiovascular: Negative for chest pain and leg swelling.  Gastrointestinal: Negative.  Negative for abdominal pain and nausea.  Genitourinary: Negative.  Negative for frequency and urgency.  Musculoskeletal: Negative.  Negative for back pain, falls and myalgias.  Neurological: Negative.  Negative for dizziness.  Psychiatric/Behavioral: Negative.  Negative for depression. The patient is not nervous/anxious.    Physical Exam  Constitutional: She is oriented to person, place, and time. She appears well-developed and well-nourished. No distress.  Cardiovascular: Normal rate, regular rhythm, normal heart sounds and intact distal pulses.  Respiratory: Effort normal and breath sounds normal. No respiratory distress. She has no wheezes. She has no rales.  GI: Soft. Bowel sounds are normal.  Musculoskeletal: She exhibits no edema.  Neurological: She is  alert and oriented to person, place, and time.  Skin: Skin is warm and dry.  Psychiatric: She has a normal mood and affect. Her behavior is normal. Judgment and thought content normal.   Encounter Medications:   Outpatient Encounter Medications as of 10/17/2017  Medication Sig Note  . insulin glargine (LANTUS) 100 UNIT/ML injection Inject 0.15 mLs (15 Units total) into the skin at bedtime. 10/17/2017: Patient reported taking on 5 U qHS-- instructed patient using teach back on correct dosing of 15 Units qHS-- patient able to verbalize correct/ accurate dosing after [facility] Community RN CM home visit-- to inform Dr. Gutierrez  . amLODipine (NORVASC) 5 MG tablet Take 1 tablet (5 mg total) by mouth daily.   . atorvastatin (LIPITOR) 40 MG tablet Take 1 tablet (40 mg total) by mouth daily at 6 PM.   . blood glucose meter kit and supplies KIT Dispense based on patient and insurance preference. Use up to four times daily as directed. (FOR ICD-9 250.00, 250.01).   . Cholecalciferol (VITAMIN D) 2000 units CAPS Take 1 capsule by mouth daily.   . clopidogrel (PLAVIX) 75 MG tablet Take 1 tablet (75 mg total) by mouth daily.   . clotrimazole (LOTRIMIN AF) 1 % cream Apply 1 application topically 2 (two) times daily. Rash under breast   . Colchicine (MITIGARE) 0.6 MG CAPS Take 1 tablet by mouth daily as needed.   . glucose blood (ONE TOUCH ULTRA TEST) test strip USE UP TO 4 TIMES DAILY AS DIRECTED   . hydrALAZINE (  APRESOLINE) 50 MG tablet Take 1 tablet (50 mg total) by mouth every 8 (eight) hours.   . KLOR-CON 10 10 MEQ tablet TAKE 1 TABLET (10 MEQ TOTAL) BY MOUTH EVERY MONDAY, WEDNESDAY, AND FRIDAY.   . lacosamide (VIMPAT) 50 MG TABS tablet Take 1.5 tablets (75 mg total) by mouth 2 (two) times daily. (Patient not taking: Reported on 09/27/2017)   . losartan-hydrochlorothiazide (HYZAAR) 100-12.5 MG tablet Take 1 tablet by mouth daily.   . nitroGLYCERIN (NITROSTAT) 0.4 MG SL tablet Place 0.4 mg under the tongue every 5  (five) minutes as needed for chest pain.   . ONETOUCH DELICA LANCETS FINE MISC USE U PTO 4 TIMES DAILY AS DIRECTED   . Valproate Sodium (DEPAKENE) 250 MG/5ML SOLN solution Take 10 mLs (500 mg total) by mouth 2 (two) times daily.    No facility-administered encounter medications on file as of 10/17/2017.    Functional Status:   In your present state of health, do you have any difficulty performing the following activities: 10/17/2017 08/12/2017  Hearing? N N  Vision? Y N  Comment "Some times, only occasionally" per patient report today -  Difficulty concentrating or making decisions? N Y  Walking or climbing stairs? N Y  Dressing or bathing? N Y  Doing errands, shopping? Y Y  Comment Does not drive, per patient report today-- family assists -  Preparing Food and eating ? N -  Using the Toilet? N -  In the past six months, have you accidently leaked urine? N -  Do you have problems with loss of bowel control? N -  Managing your Medications? N -  Managing your Finances? N -  Housekeeping or managing your Housekeeping? N -  Comment family assists per patient report -  Some recent data might be hidden   Fall/Depression Screening:    Fall Risk  10/17/2017 10/09/2017 09/28/2017  Falls in the past year? (No Data) Yes Yes  Comment Denies new/ recent falls - -  Number falls in past yr: - 2 or more 2 or more  Comment - - -  Injury with Fall? - No No  Risk Factor Category  - High Fall Risk High Fall Risk  Risk for fall due to : - History of fall(s);Impaired balance/gait;Other (Comment) History of fall(s);Impaired balance/gait;Other (Comment)  Risk for fall due to: Comment - recent CVA recent stroke in April 2019   Follow up Falls evaluation completed;Falls prevention discussed Falls prevention discussed Falls prevention discussed   PHQ 2/9 Scores 10/17/2017 09/28/2017 08/31/2017 08/09/2016 03/10/2015 12/11/2013  PHQ - 2 Score 0 1 0 0 0 0   Plan:  Patient will take medications as prescribed and  will attend all scheduled provider appointments  Patient will continue monitoring and recording daily blood sugars 2-3 times/ day  Patient will continue communicating with [facility] BSW and Pharmacist regarding assistance with transportation and medications  I will share today's [facility] Community CM initial home visit notes with patient's PCP   [facility] Community CM outreach to continue with scheduled phone call next week and home visit in 2 weeks post- provider appointments  [facility]N CM Care Plan Problem One     Most Recent Value  Care Plan Problem One  Self-health management deficits around chronic disease state of DM, as evidenced by patient reporting of same and ongoing elevated A1-C levels  Role Documenting the Problem One  Care Management Coordinator  Care Plan for Problem One  Active  THN Long Term Goal   Over the   next 60 days, patient will verbalize 3 self-health management strategies for chronic disease state of DM with elevated A1-C levels, as evidenced by patient reporting of same during TH[facility] RN CCM outreach  [facility] Long Term Goal Start Date  10/09/17  Interventions for Problem One Long Term Goal  Provided and discussed printed EMMI educational materials around self-health management of chronic disease state of DM,  discussed patient's dietary habits and use of insulin-- provided instruction to patient on dosing of insulin to take, as she was taking incorrect dosing,  reviewed with patient recently recorded blood sugars at home and created a new method for patient to monitor/ record blood sugars  THN CM Short Term Goal #1   Over the next 30 days, patient will continue to monitor and record blood sugar readings at home at least twice daily, as evidenced by review of same during TH[facility] RN CCM outreach  [facility] CM Short Term Goal #1 Start Date  10/09/17  Interventions for Short Term Goal #1  Reviewed patient's technique in monitoring blood sugars,  reviewed recent blood sugars and created new chart for patient to  use at home to record blood sugars,  encouraged patient to continue monitoring and recording blood sugars 2-3 times/ day and to take her newly created blood sugar chart to upcoming scheduled PCP office visit with her  THN CM Short Term Goal #2   Over the next 30 days, patient will attend all scheduled provider appointments, as evidenced by patient reporting and review of EMR/ collaboration with providers, as indicated  THN CM Short Term Goal #2 Start Date  10/17/17  Interventions for Short Term Goal #2  Reviewed all upcoming provider appointments with patient,  placed care coordination outreach to patient's neurology provider to confirm currently scheduled appointment and then another to re-schedule due to conflicting PCP appointment at same time,  encouraged patient to maintain close contact with [facility] BSW for assistance with arranging transportation to provider appointments,  updated patient's calendar at home to reflect all upcoming provider appointments     Laine Mckinney Tousey, RN, BSN, CCRN [employer] [facility] Care Coordinator [facility] Care Management  [phone]        

## 2017-10-18 ENCOUNTER — Ambulatory Visit: Payer: Self-pay

## 2017-10-19 ENCOUNTER — Ambulatory Visit: Payer: Self-pay

## 2017-10-19 ENCOUNTER — Other Ambulatory Visit: Payer: Self-pay

## 2017-10-19 NOTE — Patient Outreach (Signed)
[facility]) Care Management  10/19/2017  Kristen [name] 1941/02/06 [id number]  Upon reviewing the patient chart it is noted that both the patients primary care appointment as well as her neurology appointment are scheduled on July 8th. BSW attempted to contact Dr. Lannie Fields office to reschedule. Unfortunately a recording came on notifying that the office does not accept calls after noon on Friday's. BSW contacted the patient to notify that this BSW will assist with scheduling transportation next week once the patients appointment date and time have been adjusted.  Daneen Schick, BSW, CDP [facility] [phone]

## 2017-10-22 ENCOUNTER — Other Ambulatory Visit: Payer: Self-pay | Admitting: Pharmacist

## 2017-10-22 NOTE — Patient Outreach (Signed)
[facility]) Care Management  10/22/2017  Kristen [name] [birth year] [id number]  77 y.o. year old female referred to [facility] for Medication Management (Follow up)  Patient with [facility] Medicare Advantage plan.   Patient confirms identity with HIPAA-identifiers x2 and gives verbal consent to speak over the phone about medications.   Subjective:   Medication Management:  Followed up with patient today following last week's home visit. Patient states that she is managing her medications using her pill box well and without issue.  Last week's note was routed to Dr. Darnell Level via Suanne Marker message with key issues addressed in the message.   Patient did mention that even increasing her Lantus dose to 15 units nightly as prescribed, she is still experiencing occasional episodes of hyperglycemia. Patient reports that her BG is usually <200 mg/dL in the morning (this AM it was 199 mg/dL) but at night before she injects her insulin there have been times when it was around 400 mg/dL. She states when it was this high she felt "woozy" and disoriented.   I will notify [name], [facility] RNCM, of patient's report. Richarda Osmond has a phone visit scheduled with the patient for later this week. I also provided some counseling on the Lantus injection and asked what patient had been eating. Patient states she has been eating watermelon, sandwiches, and frozen food. She doesn't think these episodes of hyperglycemia are related to food intake.   Home visit scheduled with [name] next week.   [name], PharmD Clinical Pharmacist, [facility] [phone]

## 2017-10-23 ENCOUNTER — Other Ambulatory Visit: Payer: Self-pay

## 2017-10-23 NOTE — Patient Outreach (Signed)
[facility]) Care Management  10/23/2017  Kristen [name] July 13, 1940 [id number]  BSW contacted the patient on today's date to assist the patient with arranging transportation through [place]. HIPAA identifiers confirmed. BSW contacted [employer] with the patient via a 3-way call. The patient and this BSW were successful in arranging transportation to both her July 8th appointment with Dr. [name] and her July 9th appointment with Dr. [name].   During today's call patient still expressed confusion. This BSW had the patient write down her appointment date and time to see Dr. [name]. The patient was also asked to write down what time she will be picked up from the home as well as the phone number to call when she is ready to be picked up from her appointment. The patient needed several reminders and prompts of how to use her transportation benefit. BSW did not request the patient to write down her appointment information for Tuesday July 9th in efforts to minimize confusion. BSW to call the patient on Monday afternoon to remind the patient of her Tuesday appointment and transportation arrangements.   During today's call BSW asked the patient if this BSW could contact her son to notify that transportation had been arranged. The patient was excited about this offer and gave verbal permission to contact either [name] or Shanon Brow. BSW attempted to contact [name] utilizing the number provided by the patient [phone]). Unfortunately a recording came on stating the "phone number was not accepting calls" with no option to leave a voice message. BSW then attempted to contact Shanon Brow [phone]) and left a voice message requesting a return call.  Given patients confusion and this BSW's inability to make contact with either son, BSW contacted Dr. [name] office. BSW spoke with [name] and provided the contact information to [employer]. BSW requested the office staff assist the patient as  needed in calling to request transportation home. [name] stated she would add the information to the patients appointment notes.   Daneen Schick, BSW, CDP [facility] [phone]

## 2017-10-23 NOTE — Patient Outreach (Signed)
[facility]) Care Management  10/23/2017  Kristen [name] [birth year] [id number]  BSW received a voice message from the patient stating she was notified she would not be transported to her physician appointment on Monday July 8th. Prior to contacting the patient, this BSW phone [employer] to establish reasoning behind cancelling arrangement this BSW had assisted the patient in scheduling prior today. Representative at [employer] denied a cancellation stating both the July 8th and July 9th appointments were marked as confirmed in their system.  BSW phoned the patient to relay this information. Patient is very anxious stating "the man we talked to" called me back and said he could not take me to [facility]. I explained to the patient that her transportation benefit through [facility] covered any medical appointments within 50 miles. BSW further explained that this BSW had confirmed the appointment with [employer] prior to phoning the patient. The patient continued to discuss the transportation being canceled.   Unfortunately, this BSW was not in the office due to being in the field conducting a home visit with a separate patient. BSW assured the patient that this BSW would contact [employer] again with the reservation number to confirm whether or not the appointment was still scheduled. BSW to contact the patient on Friday to remind the patient of her appointment and pick-up time.  Daneen Schick, BSW, CDP [facility] [phone]

## 2017-10-26 ENCOUNTER — Other Ambulatory Visit: Payer: Self-pay

## 2017-10-26 ENCOUNTER — Other Ambulatory Visit: Payer: Self-pay | Admitting: Family Medicine

## 2017-10-26 ENCOUNTER — Encounter: Payer: Self-pay | Admitting: *Deleted

## 2017-10-26 ENCOUNTER — Other Ambulatory Visit: Payer: Self-pay | Admitting: *Deleted

## 2017-10-26 DIAGNOSIS — R569 Unspecified convulsions: Secondary | ICD-10-CM

## 2017-10-26 NOTE — Patient Outreach (Signed)
[facility]) Care Management  10/26/2017  Hollie Wojahn Grunder 08-Nov-1940 [id number]  BSW contacted [place] to discuss patients report of transportation being canceled on Monday for her July 8th appointment. [facility] representative confirmed that the patient is scheduled for transportation on July 8th and will be picked up between 8:00am - 8:30 am. BSW contacted the patient to confirm transportation is arranged. BSW reminded the patient to be ready by 8:00 am Monday morning.  Daneen Schick, BSW, CDP [facility] [phone]

## 2017-10-26 NOTE — Patient Outreach (Signed)
[facility]) Care Management [place] Telephone Outreach   10/26/2017  Kristen [name] 05/05/1940 [id number]  Successful telephone outreach to Kristen [name], 77 y.o. female referred to [place] by [facility] telephonic RN CM on original PCP referral; patient had recent hospitalization April 21-27, 2019 for acute CVA vs. seizure with subsequent expressive aphasia.Patient has history including, but not limited to, DM- Type II with Hgb A1-C > 13; CAD with previous MI; COPD/ OSA; HTN/ HLD; GERD; and morbid obesity.HIPAA/ identity verified with patient during phone call today.  Today, patient reports that she "is doing so much better now that (she) is taking insulin the right way."  Patient states that she "is sleeping better, and has more energy."  Confirmed that patient is able to accurately verbalize correct dosing of lantus insulin (15 U qHS), and that she reports she has been taking the "right way" since [place] CCM/ pharmacy initial home visit 10/17/17 (9 days ago).  Patient denies pain today, and she sounds to be in no apparent/ obvious distress throughout phone call today.  Patient further reports:  -- plans to attend scheduled PCP office visit next week on Monday 7/8 and neurology appointment on Tuesday 7/9:  Patient confirms that she has spoken with Tillie Rung [facility] BSW regarding transportation to PCP appointment, and that she "understands" she should be ready by 8:00 am that morning; I confirmed with patient that [place] would maintain contact with her, per review of previous care collaboration outreach with Tillie Rung and notes in EMR.  Self-health management ofrecent CVA, DM with elevated A1-C: --confirms that she has continued monitoring and recording blood sugars "2 or 3 times/ day;" as advised by PCP; reports sugars have been ranging from 110-180 consistently, now that she is taking insulin correctly; fasting sugar this am: 119  -- patient reports that she is  currently resting, and not near her chart where she records her blood sugars; states several times that her "numbers are much better and lower" than previously; states, "over the last couple of days, they have been under 200;" discussed with patient need to take these recorded values with her to upcoming PCP appointment, and that we would review together at next week's scheduled home visit-- patient verbalized understanding/ agreement  -- continuing to struggle with "feeling overwhelmed" and "jittery" when she has to "talk to people;" explained that this is not uncommon after CVA, and encouraged patient to be gentle with herself and to just take her time when talking to others; patient also reports occasional "nervous feelings in hands" that she attributes to recent CVA- states this happens "every now and then."  Patient denies further issues, concerns, or problems today. Iconfirmed that patient hasmy direct phone number, the main [facility] CM office phone number, and the [facility] CM 24-hour nurse advice phone number should issues arise prior to next scheduled [place] outreach with routine home visit next week, as scheduled. Encouraged patient to contact me directly if needs, questions, issues, or concerns arise prior to next scheduled outreach and patient agreed to do so.  Plan:  Patient will take medications as prescribed and will attend all scheduled provider appointments  Patient will continue monitoring and recording daily blood sugars 2-3 times/ day  Patient will continue communicating with [facility] BSW and Pharmacist regarding assistance with transportation and medications  [facility] CM outreach to continue with scheduled routine home visit next week post- scheduled provider appointments  [facility] CM Care Plan Problem One  Most Recent Value  Care Plan Problem One  Self-health management deficits around chronic disease state of DM, as evidenced by patient reporting of same and ongoing  elevated A1-C levels  Role Documenting the Problem One  Care Management Coordinator  Care Plan for Problem One  Active  [facility] Long Term Goal   Over the next 60 days, patient will verbalize 3 self-health management strategies for chronic disease state of DM with elevated A1-C levels, as evidenced by patient reporting of same during [place] outreach  [place] Term Goal Start Date  10/09/17  Interventions for Problem One Long Term Goal  Discussed with patient today recently recorded blood sugars and confirmed that patient is able to verbalize accurate dosing of insulin,  discussed patient's currentl clinical condition with her and provided positive reinforcement for patient using prescribed insulin correctly  [facility] CM Short Term Goal #1   Over the next 30 days, patient will continue to monitor and record blood sugar readings at home at least twice daily, as evidenced by review of same during [place] outreach  [facility] CM Short Term Goal #1 Start Date  10/09/17  Interventions for Short Term Goal #1  Confirmed that patient continues using blood sugar recording chart that was created for during [place] initial home visit,  reviewed recent blood sugar ranges with patient, and encouraged her to take her recorded blood sugar values with her to upcoming PCP appointment,  confirmed with patient that I would review all recorded blood sugars with her at time of routine home visit, scheduled for next week-- confirmed that patient is aware of upcoming [facility] RN CCM and Pharmacist home visit next week  [facility] CM Short Term Goal #2   Over the next 30 days, patient will attend all scheduled provider appointments, as evidenced by patient reporting and review of EMR/ collaboration with providers, as indicated  [facility] CM Short Term Goal #2 Start Date  10/17/17  Interventions for Short Term Goal #2  Confirmed with patient that she has maintained communication with [facility] BSW and that she understands that transportation has been arranged  for upcoming PCP appointment next week     Oneta Rack, RN, BSN, [employer] Coordinator [facility] Care Management  [phone]

## 2017-10-26 NOTE — Telephone Encounter (Signed)
Name of Medication:  Vimpat Name of Pharmacy: [facility] or Written Date and Quantity: 10/01/17, #60 Last Office Visit and Type: 09/27/17, f/u Next Office Visit and Type: 10/29/17, f/u Last Controlled Substance Agreement Date: none Last UDS:  none

## 2017-10-29 ENCOUNTER — Encounter: Payer: Self-pay | Admitting: Family Medicine

## 2017-10-29 ENCOUNTER — Other Ambulatory Visit: Payer: Self-pay

## 2017-10-29 ENCOUNTER — Ambulatory Visit (INDEPENDENT_AMBULATORY_CARE_PROVIDER_SITE_OTHER): Payer: Medicare Other | Admitting: Family Medicine

## 2017-10-29 VITALS — BP 140/76 | HR 78 | Temp 98.3°F | Ht 64.0 in | Wt 202.8 lb

## 2017-10-29 DIAGNOSIS — I10 Essential (primary) hypertension: Secondary | ICD-10-CM

## 2017-10-29 DIAGNOSIS — Z8673 Personal history of transient ischemic attack (TIA), and cerebral infarction without residual deficits: Secondary | ICD-10-CM

## 2017-10-29 DIAGNOSIS — M1A09X0 Idiopathic chronic gout, multiple sites, without tophus (tophi): Secondary | ICD-10-CM

## 2017-10-29 DIAGNOSIS — R569 Unspecified convulsions: Secondary | ICD-10-CM | POA: Diagnosis not present

## 2017-10-29 DIAGNOSIS — E1165 Type 2 diabetes mellitus with hyperglycemia: Secondary | ICD-10-CM

## 2017-10-29 DIAGNOSIS — Z7189 Other specified counseling: Secondary | ICD-10-CM | POA: Diagnosis not present

## 2017-10-29 DIAGNOSIS — R3915 Urgency of urination: Secondary | ICD-10-CM | POA: Diagnosis not present

## 2017-10-29 LAB — POC URINALSYSI DIPSTICK (AUTOMATED)
Blood, UA: NEGATIVE
Glucose, UA: NEGATIVE
KETONES UA: NEGATIVE
Nitrite, UA: NEGATIVE
Protein, UA: POSITIVE — AB
Spec Grav, UA: >=1.03 — AB (ref 1.010–1.025)
Urobilinogen, UA: 0.2 E.U./dL
pH, UA: 6 (ref 5.0–8.0)

## 2017-10-29 MED ORDER — COLCHICINE 0.6 MG PO CAPS: 1.0000 | ORAL_CAPSULE | Freq: Every day | ORAL | 3 refills | Status: DC | PRN

## 2017-10-29 MED ORDER — AMLODIPINE BESYLATE 5 MG PO TABS
5.0000 mg | ORAL_TABLET | Freq: Every day | ORAL | 1 refills | Status: DC
Start: 2017-10-29 — End: 2018-07-02

## 2017-10-29 MED ORDER — HYDRALAZINE HCL 50 MG PO TABS: 50.0000 mg | ORAL_TABLET | Freq: Two times a day (BID) | ORAL | 1 refills | Status: DC

## 2017-10-29 MED ORDER — LOSARTAN POTASSIUM-HCTZ 100-12.5 MG PO TABS: 1.0000 | ORAL_TABLET | Freq: Every day | ORAL | 1 refills | Status: DC

## 2017-10-29 NOTE — Assessment & Plan Note (Signed)
Check UA for concerns with increased frequency, urgency without dysuria - contaminated specimen. Will recollect next visit.

## 2017-10-29 NOTE — Addendum Note (Signed)
Addended by: Brenton Grills on: 08/27/8314 74:25 AM   Modules accepted: Orders

## 2017-10-29 NOTE — Assessment & Plan Note (Signed)
Discussed PRN colchicine dosing.
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
## 2017-10-29 NOTE — Progress Notes (Addendum)
BP 140/76 (BP Location: Left Arm, Patient Position: Sitting, Cuff Size: Large)   Pulse 78   Temp 98.3 F (36.8 C) (Oral)   Ht _0  (1.626 m)   Wt 202 lb 12 oz (92 kg)   SpO2 97%   BMI 34.80 kg/m    CC: 1 mo f/u visit  Subjective:    Patient ID: Kristen [name], female    DOB: [birth year], 77 y.o.   MRN: [id number]  HPI: Kristen [name] is a 77 y.o. female presenting on 10/29/2017 for 1 mo follow up   See prior note for details. Arrived 30 min late to 15 min appt - states transportation made her late.  Recent acute seizure with aphasia, imaging negative for acute stroke (h/o old L cerebral stroke 2018), on vimpat 62m bid and depakote 5070mbid. Completed 72 hr EEG planned neuro f/u tomorrow.   [facility] involved, appreciate their care, pharmacy and RN found discrepancies in med administration (was taking 5u lantus in place of 15u, not taking amlodipine and only taking hydralazine once daily (pt states taking bid), taking double potassium dosing, statin confusion, colchicine confusion)  Brings advanced directive form which was reviewed, and MOST form which was filled out.  Advanced directive: brings in today and will be scanned - sons are HCPOA. Does not want prolonged life support if terminal condition. MOST form reviewed and filled out today as well.   Yesterday banged L knee on coffee table.   Brings sugar log - has not been documenting correctly on dates but rather it seems she's been writing down sugars on next blank space, irregardless of date.   Increased urinary urgency and frequency. No dysuria. Would like urine checked.   Relevant past medical, surgical, family and social history reviewed and updated as indicated. Interim medical history since our last visit reviewed. Allergies and medications reviewed and updated. Outpatient Medications Prior to Visit  Medication Sig Dispense Refill  . atorvastatin (LIPITOR) 40 MG tablet Take 1 tablet (40 mg total) by mouth daily at 6 PM.      . blood glucose meter kit and supplies KIT Dispense based on patient and insurance preference. Use up to four times daily as directed. (FOR ICD-9 250.00, 250.01). 1 each 0  . Cholecalciferol (VITAMIN D) 2000 units CAPS Take 1 capsule by mouth daily.    . clopidogrel (PLAVIX) 75 MG tablet Take 1 tablet (75 mg total) by mouth daily. 90 tablet 3  . clotrimazole (LOTRIMIN AF) 1 % cream Apply 1 application topically 2 (two) times daily. Rash under breast 45 g 1  . glucose blood (ONE TOUCH ULTRA TEST) test strip USE UP TO 4 TIMES DAILY AS DIRECTED 100 each 3  . insulin glargine (LANTUS) 100 UNIT/ML injection Inject 0.15 mLs (15 Units total) into the skin at bedtime. 10 mL 11  . KLOR-CON 10 10 MEQ tablet TAKE 1 TABLET (10 MEQ TOTAL) BY MOUTH EVERY MONDAY, WEDNESDAY, AND FRIDAY. 30 tablet 1  . lacosamide (VIMPAT) 50 MG TABS tablet Take 1.5 tablets (75 mg total) by mouth 2 (two) times daily. 90 tablet 0  . nitroGLYCERIN (NITROSTAT) 0.4 MG SL tablet Place 0.4 mg under the tongue every 5 (five) minutes as needed for chest pain.    . Glory RosebushELICA LANCETS FINE MISC USE U PTO 4 TIMES DAILY AS DIRECTED 100 each 2  . Valproate Sodium (DEPAKENE) 250 MG/5ML SOLN solution Take 10 mLs (500 mg total) by mouth 2 (two) times daily. 300 mL 0  .  amLODipine (NORVASC) 5 MG tablet Take 1 tablet (5 mg total) by mouth daily. 30 tablet 3  . Colchicine (MITIGARE) 0.6 MG CAPS Take 1 tablet by mouth daily as needed. 30 capsule 3  . hydrALAZINE (APRESOLINE) 50 MG tablet Take 1 tablet (50 mg total) by mouth every 8 (eight) hours. (Patient taking differently: Take 50 mg by mouth daily. )    . losartan-hydrochlorothiazide (HYZAAR) 100-12.5 MG tablet Take 1 tablet by mouth daily. 90 tablet 3   No facility-administered medications prior to visit.      Per HPI unless specifically indicated in ROS section below Review of Systems     Objective:    BP 140/76 (BP Location: Left Arm, Patient Position: Sitting, Cuff Size: Large)    Pulse 78   Temp 98.3 F (36.8 C) (Oral)   Ht _0  (1.626 m)   Wt 202 lb 12 oz (92 kg)   SpO2 97%   BMI 34.80 kg/m   Wt Readings from Last 3 Encounters:  10/29/17 202 lb 12 oz (92 kg)  09/27/17 202 lb 8 oz (91.9 kg)  09/11/17 208 lb 8 oz (94.6 kg)    Physical Exam  Constitutional: She appears well-developed and well-nourished. No distress.  HENT:  Mouth/Throat: Oropharynx is clear and moist. No oropharyngeal exudate.  Cardiovascular: Normal rate, regular rhythm and normal heart sounds.  No murmur heard. Pulmonary/Chest: Effort normal and breath sounds normal. No respiratory distress. She has no wheezes. She has no rales.  Musculoskeletal: She exhibits no edema.  Psychiatric: She has a normal mood and affect.  Nursing note and vitals reviewed.  Results for orders placed or performed in visit on 10/29/17  POCT Urinalysis Dipstick (Automated)  Result Value Ref Range   Color, UA gold    Clarity, UA cloudy    Glucose, UA Negative Negative   Bilirubin, UA 1+    Ketones, UA negative    Spec Grav, UA >=1.030 (A) 1.010 - 1.025   Blood, UA negative    pH, UA 6.0 5.0 - 8.0   Protein, UA Positive (A) Negative   Urobilinogen, UA 0.2 0.2 or 1.0 E.U./dL   Nitrite, UA negative    Leukocytes, UA Small (1+) (A) Negative   Lab Results  Component Value Date   HGBA1C 13.1 (H) 08/13/2017       Assessment & Plan:   Problem List Items Addressed This Visit    Urinary urgency    Check UA for concerns with increased frequency, urgency without dysuria - contaminated specimen. Will recollect next visit.       Relevant Orders   POCT Urinalysis Dipstick (Automated) (Completed)   Seizure ([place])    Will await neuro eval tomorrow.       History of CVA (cerebrovascular accident)   Gout    Discussed PRN colchicine dosing.       Essential hypertension    Chronic, stable. Continue current regimen including amlodipine 52m and hydralazine 534mbid. meds refilled.       Relevant Medications     hydrALAZINE (APRESOLINE) 50 MG tablet   amLODipine (NORVASC) 5 MG tablet   losartan-hydrochlorothiazide (HYZAAR) 100-12.5 MG tablet   Diabetes type 2, uncontrolled ([place]- Primary    Improving readings with correct lantus administration.  Too soon for A1c. Reassess at 6wk f/u visit.       Relevant Medications   losartan-hydrochlorothiazide (HYZAAR) 100-12.5 MG tablet   Other Relevant Orders   POCT Urinalysis Dipstick (Automated) (Completed)   Advanced care  planning/counseling discussion    Advanced directive: brings in today and will be scanned - sons are HCPOA. Does not want prolonged life support if terminal condition. MOST form reviewed and filled out today as well.           Meds ordered this encounter  Medications  . Colchicine (MITIGARE) 0.6 MG CAPS    Sig: Take 1 tablet by mouth daily as needed (for gout).    Dispense:  30 capsule    Refill:  3    Or equivalent preferred insurance formulary  . hydrALAZINE (APRESOLINE) 50 MG tablet    Sig: Take 1 tablet (50 mg total) by mouth 2 (two) times daily.    Dispense:  180 tablet    Refill:  1  . amLODipine (NORVASC) 5 MG tablet    Sig: Take 1 tablet (5 mg total) by mouth daily.    Dispense:  90 tablet    Refill:  1  . losartan-hydrochlorothiazide (HYZAAR) 100-12.5 MG tablet    Sig: Take 1 tablet by mouth daily.    Dispense:  90 tablet    Refill:  1   Orders Placed This Encounter  Procedures  . POCT Urinalysis Dipstick (Automated)    Follow up plan: Return in about 6 weeks (around 12/10/2017) for follow up visit.  [name], MD

## 2017-10-29 NOTE — Assessment & Plan Note (Signed)
Chronic, stable. Continue current regimen including amlodipine 5mg  and hydralazine 50mg  bid. meds refilled.

## 2017-10-29 NOTE — Patient Instructions (Addendum)
Continue potassium Monday Wednesday Friday  Take colchicine only as needed for gout attacks. Restart amlodipine 5mg  daily, continue hydralazine twice daily.  Continue checking sugars and writing down in log - make sure you are writing on correct date. Urinalysis today. Return in 4-6 wks for follow up visit - labs at that time.

## 2017-10-29 NOTE — Assessment & Plan Note (Signed)
Advanced directive: brings in today and will be scanned - sons are HCPOA. Does not want prolonged life support if terminal condition. MOST form reviewed and filled out today as well.

## 2017-10-29 NOTE — Assessment & Plan Note (Signed)
Improving readings with correct lantus administration.  Too soon for A1c. Reassess at 6wk f/u visit.

## 2017-10-29 NOTE — Assessment & Plan Note (Signed)
Will await neuro eval tomorrow.

## 2017-10-29 NOTE — ACP (Advance Care Planning) (Signed)
Advanced directive: brings in today and will be scanned - sons are HCPOA. Does not want prolonged life support if terminal condition. MOST form reviewed and filled out today as well.

## 2017-10-30 ENCOUNTER — Other Ambulatory Visit: Payer: Self-pay

## 2017-10-30 DIAGNOSIS — R569 Unspecified convulsions: Secondary | ICD-10-CM | POA: Diagnosis not present

## 2017-10-30 DIAGNOSIS — R6889 Other general symptoms and signs: Secondary | ICD-10-CM | POA: Diagnosis not present

## 2017-10-30 DIAGNOSIS — Z8673 Personal history of transient ischemic attack (TIA), and cerebral infarction without residual deficits: Secondary | ICD-10-CM | POA: Diagnosis not present

## 2017-10-30 NOTE — Patient Outreach (Signed)
[facility]) Care Management  10/30/2017  Kristen [name] Sep 03, 1940 [id number]  BSW contacted [employer] to verify if patients transportation provider had been updated for her scheduled ride to see Dr. [name] today. It was confirmed that the patient will be transported by [facility]. BSW contacted the patient to notify that the cab company had been updated. The patient verified she was also contacted yesterday by [facility] to inform her the transportation provider had been updated.  Daneen Schick, BSW, CDP [facility] [phone]

## 2017-10-31 ENCOUNTER — Other Ambulatory Visit: Payer: Self-pay

## 2017-10-31 ENCOUNTER — Encounter: Payer: Self-pay | Admitting: *Deleted

## 2017-10-31 ENCOUNTER — Other Ambulatory Visit: Payer: Self-pay | Admitting: *Deleted

## 2017-10-31 ENCOUNTER — Other Ambulatory Visit: Payer: Self-pay | Admitting: Pharmacist

## 2017-10-31 NOTE — Patient Outreach (Signed)
[facility]) Care Management  10/31/2017  Kristen [name] 1940-08-20 [id number]  Follow up home visit conducted with Kristen [name], [facility] community RNCM.   Check of patient's medication list with recent discharge summaries from primary care (Dr. [name]) and neurology (Dr. [name]).   During PCP visit, Dr Eldred Manges clarified many of questions that were uncovered in initial pharmacy home visit. Patient verbalizes these changes well and medication list has been updated to reflect all clarifications.   Dr. Lannie Fields AVS from [facility]) has many inaccuracies. Will provide him with updated med list.   Of note, patient does not have Vimpat and is not taking Depakote solution as she thought it was discontinued. Has new prescription from Dr. [name] for metoprolol for anxiety.  Plan:  1. Depakote: Will clarify Depakote with Dr. [name]. [facility].  2. Vimpat: [facility] to [facility] to check on status of Vimpat and new rx: metoprolol. Medications are ready. Pharmacy technician states that copay for 1 month supply of Vimpat will be $95. This is too expensive for patient. Placed call to [employer] seeking clarification. Spoke with representative [place]. He states that Vimpat is now tier 4 on formulary. I noted that the formulary available online updated October 22, 2017 states that Vimpat is tier 1. [name] checks with his pharmacy supervisor for Tier 1 alternatives and states that topiramate or lamotrigine will be covered for the patient. Place an urgent phone call to Dr. [name] and speak with his nurse, [name]. Dr. [name] will send in a script for lamotrigine 25mg  to be titrated up to dose of 100mg  BID. Reviewed the first step of the titration in great detail with patient. Called [facility] and asked them to hold/DC Vimpat and to please review lamotrigine titration when she comes to pick up tomorrow.   Titration:  Week 1-2: take 1 pill at night Week 3-4: take 1 pill  twice a day Week 5-6: take 1 pill AM and 2 pills PM Week 7-8: take 2 pills twice a day Week 9-10: take 2 pills AM and 3 pills PM Week 11-12: take 3 pills twice a day Week 13-14: take 3 pills in the morning and 4 pills at night Week 15-16: take 4 pills twice a day, after this we will call in 100 mg tabs to be taken 100 mg twice a day   3. Med list: Send updated copies to Dr. [name] and Dr. [name].  4. Follow up: 2 weeks (will be time for next titration of lamotrigine).   [name], PharmD Clinical Pharmacist, [facility] [phone]

## 2017-10-31 NOTE — Patient Outreach (Signed)
Kristen [name] [facility]) Care Management  [place] Routine Home Visit 10/31/2017  Kristen [name] 13-Sep-1940 [id number]  Kristen [name] is an 77 y.o. female referred to [place] by [facility] telephonic RN CM on original PCP referral; patient had recent hospitalization April 21-27, 2019 for acute CVA vs. seizure with subsequent expressive aphasia.Patient has history including, but not limited to, DM- Type II with Hgb A1-C > 13; CAD with previous MI; COPD/ OSA; HTN/ HLD; GERD; and morbid obesity.HIPAA/ identity verified with patient during home visit today; [facility] Pharmacist Kristen [name] is also present for part of visit; patient is home alone for today's visit; pleasant 75 minute visit.  Today, patient reports that she "is doing really good," but "banged up" her knee on her way to recent PCP office visit; states she "is still having knee pain."  Patient denies new/ recent falls, and she ambulated around her home without difficulty without use of assistive devices today- gait is steady and purposeful.  Patient is no apparent/ obvious distress throughout entirety of today's visit.    Subjective: "I think my sugars have come down a lot since I started taking my insulin the right way; I hope that I can someday stop taking insulin all together."  Assessment:   Kristen [name] appears to continue recuperating well after her recent hospitalization.  Kristen [name] has shown great progress in managing her insulin appropriately after last [facility] home visits in later June, and her resultant blood sugar values at home have improved significantly now that she is taking the correct dose of insulin.  Kristen [name] continues working with [place] for ongoing transportation needs and with [facility] Pharmacist for medication management assistance.  Kristen [name] could benefit from ongoing education around self-health management of chronic disease state of DM and seems to work best in face to face interactions; she appears to  remain motivated to improve self-health strategies for DM and would like to eventually not have to take insulin.  Kristen [name] will most likely require frequent reinforcement of education provided due to her recent stroke that she feels has affected her ability to remember and retain information.  Patient further reports:  -- Attended both PCP and neurology office provider visits this week; continues working with Kristen [name], Kristen [name] regarding transportation needs to attend provider appointments; all upcoming provider appointments reviewed with patient today; confirmed that patient has Kristen [name]'s phone number and encouraged her to maintain contact with Kristen [name]; patient verbalizes agreement  -- has and is taking all medications as prescribed-- noted through review of recent provider appointments this week that several newly ordered medications ny neurology provider had not yet been obtained by patient (appointment was just yesterday); discussed these medications with Kristen [name] Pharmacist at time of visit today-- please see [place] note form today 10/31/17; confirmed that patient is able to accurately verbalize correct dosing for use of lantus insulin pen-- positive reinforcement provided  Self-health management ofrecent CVA, DM with elevated A1-C: --confirms thatshe has continued monitoring and recording blood sugars "2 or 3 times/ day;" as advised by PCP; stated that she did take her recorded blood sugars to PCP office visit this week; noted from review of blood sugars that since this week's PCP office visit, patient had not recorded any additional blood sugars post PCP office visit; patient stated that when she "banged her knee up" that it caused so much pain that she 'just didn't feel like doing anything;" -- encouraged patient to maintain this practice  consistently and assisted in creating additional patient recording forms for her ongoing use through August 2019  --  All recorded blood  sugars at home were reviewed with patient today since [place] initial home visit 10/17/17:   Fasting ranges: 119-387 Post-prandial ranges: 117-419 ** ranges most consistently recorded are < 200 for both fasting and post-prandial values ** Today's blood sugar at lunch time hour: 140  -- provided and thoroughly reviewed using teach back method printed educational material re: concept/ significance/ value of A1-C level monitoring; viewed patient's personal trends with A1-C values over last year and wrote these down for patient  -- provided and thoroughly reviewed using teach back method printed educational material re: signs/ symptoms low blood sugar with corresponding action plan  Patient denies further issues, concerns, or problems today. Iconfirmed that patient hasmy direct phone number, the main [facility] CM office phone number, and the [facility] CM 24-hour nurse advice phone number should issues arise prior to next scheduled Kristen [name] outreach with telephone outreach in 3 weeks and routine home visit next month, after next scheduled PCP office visit; both scheduled with patient today and added to her personal calendar. Encouraged patient to contact me directly if needs, questions, issues, or concerns arise prior to next scheduled outreachandpatient agreed to do so.  Objective:    BP (!) 150/84   Pulse (!) 56   Resp 16   SpO2 95%   Review of Systems  Constitutional: Negative.   Respiratory: Negative.  Negative for cough, shortness of breath and wheezing.   Cardiovascular: Negative.  Negative for chest pain, palpitations and leg swelling.  Gastrointestinal: Negative.  Negative for abdominal pain and nausea.  Genitourinary: Negative.   Musculoskeletal: Positive for joint pain and myalgias. Negative for falls.       Knee pain related to recently bumping into coffee table on Monday 10/29/17-- does not impede ambulation/ no assistive devices  Neurological: Negative.  Negative for dizziness  and headaches.  Psychiatric/Behavioral: Negative.  Negative for depression. The patient is not nervous/anxious.    Physical Exam  Constitutional: She is oriented to person, place, and time. She appears well-developed and well-nourished. No distress.  Cardiovascular: Normal rate, regular rhythm, normal heart sounds and intact distal pulses.  Respiratory: Effort normal and breath sounds normal. No respiratory distress. She has no wheezes. She has no rales.  GI: Soft. Bowel sounds are normal.  Musculoskeletal: She exhibits no edema.  Neurological: She is alert and oriented to person, place, and time.  Skin: Skin is warm and dry.  Psychiatric: She has a normal mood and affect. Her behavior is normal. Judgment and thought content normal.   Encounter Medications:   Outpatient Encounter Medications as of 10/31/2017  Medication Sig Note  . amLODipine (NORVASC) 5 MG tablet Take 1 tablet (5 mg total) by mouth daily.   Marland Kitchen atorvastatin (LIPITOR) 40 MG tablet Take 1 tablet (40 mg total) by mouth daily at 6 PM.   . blood glucose meter kit and supplies KIT Dispense based on patient and insurance preference. Use up to four times daily as directed. (FOR ICD-9 250.00, 250.01).   . Cholecalciferol (VITAMIN D) 2000 units CAPS Take 1 capsule by mouth daily.   . clopidogrel (PLAVIX) 75 MG tablet Take 1 tablet (75 mg total) by mouth daily.   . clotrimazole (LOTRIMIN AF) 1 % cream Apply 1 application topically 2 (two) times daily. Rash under breast   . Colchicine (MITIGARE) 0.6 MG CAPS Take 1 tablet by  mouth daily as needed (for gout).   Marland Kitchen glucose blood (ONE TOUCH ULTRA TEST) test strip USE UP TO 4 TIMES DAILY AS DIRECTED   . hydrALAZINE (APRESOLINE) 50 MG tablet Take 1 tablet (50 mg total) by mouth 2 (two) times daily.   . insulin glargine (LANTUS) 100 UNIT/ML injection Inject 0.15 mLs (15 Units total) into the skin at bedtime. 10/17/2017: Patient reported taking on 5 U qHS-- instructed patient using teach back on  correct dosing of 15 Units qHS-- patient able to verbalize correct/ accurate dosing after [place] RN CM home visit-- to inform Dr. [name]  . KLOR-CON 10 10 MEQ tablet TAKE 1 TABLET (10 MEQ TOTAL) BY MOUTH EVERY MONDAY, WEDNESDAY, AND FRIDAY. 10/17/2017: Patient also taking potassium chloride 57mq daily prescribed by Dr. STamala Julian . losartan-hydrochlorothiazide (HYZAAR) 100-12.5 MG tablet Take 1 tablet by mouth daily.   . nitroGLYCERIN (NITROSTAT) 0.4 MG SL tablet Place 0.4 mg under the tongue every 5 (five) minutes as needed for chest pain. 10/17/2017: Has not needed   . ONETOUCH DELICA LANCETS FINE MISC USE U PTO 4 TIMES DAILY AS DIRECTED   . Valproate Sodium (DEPAKENE) 250 MG/5ML SOLN solution Take 10 mLs (500 mg total) by mouth 2 (two) times daily. 10/17/2017: Patient states was Dc'ed   . VIMPAT 50 MG TABS tablet TAKE 1.5 TABLETS (75 MG TOTAL) BY MOUTH 2 (TWO) TIMES DAILY.    No facility-administered encounter medications on file as of 10/31/2017.    Fall/Depression Screening:    Fall Risk  10/31/2017 10/17/2017 10/09/2017  Falls in the past year? (No Data) (No Data) Yes  Comment No new or recent falls, per patient report today Denies new/ recent falls -  Number falls in past yr: - - 2 or more  Comment - - -  Injury with Fall? - - No  Risk Factor Category  - - High Fall Risk  Risk for fall due to : - - History of fall(s);Impaired balance/gait;Other (Comment)  Risk for fall due to: Comment - - recent CVA  Follow up Falls prevention discussed Falls evaluation completed;Falls prevention discussed Falls prevention discussed   Plan:  Patient will take medications as prescribed and will attend all scheduled provider appointments  Patient will continue monitoring and recording daily blood sugars 2-3 times/ day  Patient will continue communicating with [facility] and Pharmacist regarding assistance with transportation and medications  [facility] CM outreach to continue with  scheduledtelephone call in 3 weeks and routinehome visitnext month post- scheduled PCP appointment  [facility] Problem One     Most Recent Value  Care Plan Problem One  Self-health management deficits around chronic disease state of DM, as evidenced by patient reporting of same and ongoing elevated A1-C levels  Role Documenting the Problem One  Care Management CWestonfor Problem One  Active  [facility] Long Term Goal   Over the next 60 days, patient will verbalize 3 self-health management strategies for chronic disease state of DM with elevated A1-C levels, as evidenced by patient reporting of same during [place]outreach  TWest LibertyTerm Goal Start Date  10/09/17  Interventions for Problem One Long Term Goal  Discussed with patient recent provider office visits/ medications changes,  collaborated with [place]erndalere: ongoing medication management (joint home visit),  confirmed that patient is able to accurately verbalize dosing instructions for insulin as ordered,  introduced concept of A1-C monitoring and provided/ reviewed using teach back  with patient printed EMMI educational material on same,  reviewed with patient's her A1-C values over last year, and how they correlate with blood sugars-- initiated education around signs/ symptoms low blood sugar along with action plan for same  [facility] CM Short Term Goal #1   Over the next 30 days, patient will continue to monitor and record blood sugar readings at home at least twice daily, as evidenced by review of same during [place] outreach  [facility] CM Short Term Goal #1 Start Date  10/31/17 [Goal extended today]  Interventions for Short Term Goal #1  Reviewed all blood sugars recorded at home with patient,  created additional recording tool for patient to continue using for recording blood sugars in organized manner,  reiterated and re-reviewed with patient previously provided education around importance of consistent blood sugar  monitoring and encouraged her to remain diligent with daily monitoring/ recording,    [facility] CM Short Term Goal #2   Over the next 30 days, patient will attend all scheduled provider appointments, as evidenced by patient reporting and review of EMR/ collaboration with providers, as indicated  [facility] CM Short Term Goal #2 Start Date  10/17/17  Interventions for Short Term Goal #2  Reviewed recent and upcoming provider appointments with patient and wrote all upcoming appointments on patient's calendar for her,  encouraged patient to maintain contact with [facility] BSW for ongoing transportation needs while [facility] CM is active in her care     Oneta Rack, RN, BSN, [place] Coordinator [facility] Care Management  [phone]

## 2017-10-31 NOTE — Patient Outreach (Signed)
[name]  10/31/2017  Kristen [name] 08-22-1940 [id number]   Medication Adherence call to [name] spoke with patient she said she still has medication for Atorvastatin 80 mg patient is past due 38 days.[name] is showing past due under [place].  [place] Management Direct Dial [phone]  Fax [phone] [email or web address]

## 2017-11-15 ENCOUNTER — Other Ambulatory Visit: Payer: Self-pay

## 2017-11-15 ENCOUNTER — Other Ambulatory Visit: Payer: Self-pay | Admitting: Pharmacist

## 2017-11-15 NOTE — Patient Outreach (Signed)
[name]) Care Management  11/15/2017  Kristen [name] [place] [birth year] [id number]  77 y.o. year old female referred to [place] for Medication Management (Lamotrigine)  Patient with Kristen [name] plan.  Patient confirms identity with HIPAA-identifiers x2 and gives verbal consent to speak over the phone about medications.   Medication Management:  Calling patient today to assist her with her lamotrigine titration following first two weeks of medication initiation (see plan from 10/31/17 for detail). Patient reports she stopped taking lamotrigine on Sunday, 11/11/17 because she felt extremely itchy. The itchiness she described as similar to "mosquito bites" but she did not notice any rash. The itching was on her neck, arms, and legs. Her [facility] pharmacist advised her to stop taking the lamotrigine, and I would agree with this assessment due to risk of SJS associated with lamotrigine.   Patient further states that she feels "really good" and doesn't think she needs any seizure medicine. She reports that she hasn't had any seizures and is doing well. I encouraged her to speak to Kristen [name] about this during her upcoming appointment (11/28/2017).   Plan: Kristen Deutscher, RN for Kristen [name] about patient stopping lamotrigine. Kristen [name] called back, Kristen [name] will wait until 11/28/2017 to assess patient further before intiating any other pharmacotherapy. Patient is without seizure coverage until then. Other tier 1 option on formulary per [employer] on 10/31/17 is Topamax.   Will follow up in about 1 week to assess adherence. Patient requested a home visit but in my opinion she is doing extremely well so far with medication management.   [name], PharmD Clinical Pharmacist, [facility] [phone]

## 2017-11-15 NOTE — Patient Outreach (Signed)
[facility]) Care Management  11/15/2017  Kristen [name] 1941/03/05 [id number]  BSW attempted to contact the patient to arrange a home visit for continued transportation support. Unfortunately, the patient did not answer. The phone continued to ring without the option to leave a voice message. BSW will attempt patient again in the next four business days.  Daneen Schick, BSW, CDP [facility] [phone]

## 2017-11-20 ENCOUNTER — Other Ambulatory Visit: Payer: Self-pay

## 2017-11-20 NOTE — Patient Outreach (Signed)
[facility]) Care Management  11/20/2017  Kristen [name] 11/15/1940 [id number]  BSW contacted the patient on today's date, HIPAA identifiers confirmed. The patient is able to state the dates to her [place] appointments. The patient states she plans to utilize her [facility] transportation benefits provided by [employer].The patient has yet to contact [employer] for reservation. BSW reminded the patient that reservations must be made 3 business days in advance. BSW explained the patient needed to call by Friday August 2 to secure transportation for her August 8th appointment. The patient stated understanding.   The patient confident in her ability to arrange transportation without the assistance of BSW. The patient to call [employer] today for arrangements. BSW to contact the patient later this week to confirm the patient successfully arranged. BSW will plan discipline closure during next call pending the patients success in arranging transportation independently.  Daneen Schick, BSW, CDP [facility] [phone]

## 2017-11-21 ENCOUNTER — Encounter: Payer: Self-pay | Admitting: *Deleted

## 2017-11-21 ENCOUNTER — Other Ambulatory Visit: Payer: Self-pay | Admitting: *Deleted

## 2017-11-21 NOTE — Patient Outreach (Signed)
[facility]) Care Management [place] Telephone Outreach  11/21/2017  Kristen [name] Jul 24, 1940 [id number]  Successful telephone outreach to Kristen [name], 77 y.o. female referred to [place] by [facility] telephonic RN CM on original PCP referral; patient had recent hospitalization April 21-27, 2019 for acute CVA vs. seizure with subsequent expressive aphasia.Patient has history including, but not limited to, DM- Type II with Hgb A1-C >13; CAD with previous MI; COPD/ OSA; HTN/ HLD; GERD; and morbid obesity.HIPAA/ identity verified during phone call today.  Today, patient reports that she "is doing okay" and she reports that she has just recently experienced the loss of a close family member, and is upset; states that the loss of this family member just happened this week.  States that since she found out about this loss, she "hasn't felt real good," due to her emotional upset.  States that she "may have had" signs/ symptoms low blood sugar a couple of days ago, but also stated that it may be due to her emotional upset; patient stated she did not check her blood sugar during this episode, stated that she just "went back to bed."  Patient denies pain/ concerns/ issues/ problems/ new/ recent falls today, and she sounds to be in no obvious or apparent distress throughout entirety of phone call today.  Reports that her previous injury to her knee, reported at time of last home visit "is still bruised up" but is not causing her any pain; patient was encouraged to contact her PCP if she concerned about her knee, but she states that she "will probably just wait" until next scheduled PCP office visit, adding that she is "not that concerned about it."  Patient further reports:  -- Plans to attend upcoming neurology office visit 11/28/17: confirms that she has spoken to Daneen Schick regarding transportation through [employer]; has not yet scheduled this transportation;  stated that she plans to call to schedule "tomorrow."  Patient was encouraged to promptly schedule transportation, as it should be scheduled several days prior to needed transportation.  Patient verbalizes that she understands and assures me today that she "will do tomorrow."  Confirmed that patient plans to attend upcoming scheduled PCP office visit on 12/11/17 and states she will arrange transportation for that visit as well.  -- has and is taking all medications as prescribed-- patient denies recent changes to medications and continues able to accurately verbalize correct dosing for use of lantus insulin pen-- positive reinforcement provided.  Denies further concerns to medications today.  Self-health management ofrecent CVA, DM with elevated A1-C: --confirms thatshe hascontinuedmonitoring and recording blood sugars "2 or 3 times/ day;"asadvised by PCP; states that she has continued recording blood sugars on log developed at time of [facility] CM home visit.  Reports blood sugars have consistently been ranging from "130-160" with each check at home; reports she has not yet checked blood sugar this morning: asked patient to do while we are on phone and she agreed:  Fasting blood sugar today during phone call reported by patient as "147." -- Reiterated with patient previously provided education around significance of A1-C levels, as patient is unable today to independently verbalize same; with reiteration of previously provided education, patient is able to verbalize understanding; we again reviewed her A1-C trends -- able to verbalize signs/ symptoms low blood sugar with minimal prompting; able to verbalize appropriate action plan for hypoglycemic action plan: check blood sugar, eat something, rest, check blood sugar, call doctor for  ongoing concerns not immediately responsive to action plan interventions -- continues to report that she believes she is "eating right," stating that she mainly eats salads  because that is what she likes.  Patient denies further issues, concerns, or problems today. Iconfirmed that patient hasmy direct phone number, the main [facility] CM office phone number, and the [facility] CM 24-hour nurse advice phone number should issues arise prior to next scheduled [place] outreach with telephone outreach in 2 weeks and routine home visit next month, after next scheduled PCP office visit. Encouraged patient to contact me directly if needs, questions, issues, or concerns arise prior to next scheduled outreachandpatient agreed to do so.  Plan:  Patient will take medications as prescribed and will attend all scheduled provider appointments  Patient will continue monitoring and recording daily blood sugars 2-3 times/ day  Patient will continue communicating with [facility] BSW and Pharmacist regarding assistance with transportation and medications  [facility] CM outreach to continue with scheduledtelephone call in 2 weeks and routinehome visitnextmonthpost- scheduled PCPappointment  [facility] CM Care Plan Problem One     Most Recent Value  Care Plan Problem One  Self-health management deficits around chronic disease state of DM, as evidenced by patient reporting of same and ongoing elevated A1-C levels  Role Documenting the Problem One  Care Management Coordinator  Care Plan for Problem One  Active  [facility] Long Term Goal   Over the next 60 days, patient will verbalize 3 self-health management strategies for chronic disease state of DM with elevated A1-C levels, as evidenced by patient reporting of same during [place] outreach  [place] Term Goal Start Date  10/09/17  Interventions for Problem One Long Term Goal  Reviewed with patient recent blood sugars,  isolated episode of possible low blood sugar-- reiterated previously provided education around A1-C levels and signs/ symptoms hypoglyemia with corresponding action plan  [facility] CM Short Term Goal #1   Over the next 30 days,  patient will continue to monitor and record blood sugar readings at home at least twice daily, as evidenced by review of same during [place] outreach  [facility] CM Short Term Goal #1 Start Date  10/31/17  Interventions for Short Term Goal #1  Provided positive reinforcement for ongoing adherence to monitoring and recording blood sugars,  reviewed recently recorded blood sugars with patient and encouraged patient to continue using blood sugar log developed during of previous [facility] CM home visit to record all blood sugar readings at home.  Encouraged patient to remember to take her recorded home blood sugar values with her to upcoming PCP appointment  [facility] CM Short Term Goal #2   Over the next 21 days, patient will attend all scheduled provider appointments, as evidenced by patient reporting and review of EMR/ collaboration with providers, as indicated [Goal modified/ extended today]  [facility] CM Short Term Goal #2 Start Date  11/21/17  Interventions for Short Term Goal #2  Confirmed that patient continues communicating with [facility] BSW regarding transportation resource thorugh her insurance provider,  encouraged patient to promptly schedule transportation to upcoming scheduled provider appointments     Oneta Rack, RN, BSN, [employer] Coordinator [facility] Care Management  [phone]

## 2017-11-22 ENCOUNTER — Other Ambulatory Visit: Payer: Self-pay

## 2017-11-22 NOTE — Patient Outreach (Signed)
[place] [facility]) Care Management  11/22/2017  Kristen [name] May 28, 1940 [id number]  Successful outreach to the patient on today's date, HIPAA identifiers confirmed. The patient confirms she has contacted [employer] and successfully arranged transportation to her [place] appointment for August 7th. The patient states she tried to arrange transportation for her appointment on August 20th but was unsuccessful due to being "too soon" to schedule. BSW reminded the patient she is only able to call two weeks in advance but must call at least three business days in advance of needed transportation. The patient states understanding and confirms she plans to call back "next week" to secure transportation to her appointment on August 20th.  BSW discussed performing a discipline closure with the patient. Although the patient has been successful, she is hesitant with discussion of a discipline closure. Patient stated "don't stop calling because I may forget". BSW to contact the patient next month to confirm all tools are in place for the patient to continue being successful. BSW will perform a discipline closure during the next scheduled call. The patient has this BSW's contact information and is aware she may call if needed prior to the next scheduled call. BSW will update the patients [facility] RNCM, [name] of patients recent success in utilizing her transportation benefit.  Daneen Schick, BSW, CDP [facility] [phone]

## 2017-11-28 ENCOUNTER — Telehealth: Payer: Self-pay | Admitting: Family Medicine

## 2017-11-28 DIAGNOSIS — L608 Other nail disorders: Secondary | ICD-10-CM | POA: Diagnosis not present

## 2017-11-28 DIAGNOSIS — Z8673 Personal history of transient ischemic attack (TIA), and cerebral infarction without residual deficits: Secondary | ICD-10-CM | POA: Diagnosis not present

## 2017-11-28 DIAGNOSIS — R569 Unspecified convulsions: Secondary | ICD-10-CM | POA: Diagnosis not present

## 2017-11-28 NOTE — Telephone Encounter (Signed)
Pt last seen 03/12/17.pt has upcoming appt on 12/11/17 with Dr [name].

## 2017-11-28 NOTE — Telephone Encounter (Signed)
Copied from [place] [id number]. Topic: General - Other >> Nov 28, 2017  9:52 AM Cecelia Byars, NT wrote: Reason for CRM: Dr Weber Cooks office from [facility] called to make Dr [name] aware the patient per the pharmacy does not take the following mediations  coumadin,januvia,glimepiride or simvastatin

## 2017-11-29 ENCOUNTER — Other Ambulatory Visit: Payer: Self-pay | Admitting: Pharmacist

## 2017-11-29 NOTE — Patient Outreach (Signed)
[facility]) Care Management  11/29/2017  Kahliya Fraleigh Fessenden 03-18-1941 [id number]  77 y.o. year old female referred to [place] for Medication Adherence and Medication Management   Patient confirms identity with HIPAA-identifiers x2 and gives verbal consent to speak over the phone about medications.   Follow up call today with patient following her neurology appointment with Dr. [name] yesterday.   Dr. [name] has decided to discontinue Vimpat. Gayathri is to take one tablet once daily for one week before stopping completely. We discussed this at length.   Per Dr. Lannie Fields note, it appears he did not receive my previous fax of patient's updated medication list and placed a call to patient's pharmacy and Dr. Synthia Innocent office. However, in my phone calls and home visits with her, our medication list in Epic and Dr. Synthia Innocent medication list is correct.   Colena scheduled a podiatry appointment for later this month to get her toenail looked at. She voiced some concerns to me about not being able to be as mobile or complete ADLs after her podiatry visit. I encouraged Cordie to discuss this with Richarda Osmond and stated that we will assist her in any way we can.   Khylah is doing very well. I provided positive reinforcement and encouragement for meeting many of her health goals. I have scheduled one final medication adherence co-home visit in two weeks following her appointment with Dr. Darnell Level just to ensure her medication list will be stable. I plan to close the pharmacy episode at that time unless there are new issues as Casimira is doing very well.   [name], PharmD Clinical Pharmacist, [facility] [phone]

## 2017-12-03 NOTE — Telephone Encounter (Signed)
noted 

## 2017-12-04 ENCOUNTER — Encounter: Payer: Self-pay | Admitting: *Deleted

## 2017-12-04 ENCOUNTER — Other Ambulatory Visit: Payer: Self-pay | Admitting: *Deleted
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
## 2017-12-11 ENCOUNTER — Other Ambulatory Visit: Payer: Self-pay | Admitting: Family Medicine

## 2017-12-11 ENCOUNTER — Ambulatory Visit (INDEPENDENT_AMBULATORY_CARE_PROVIDER_SITE_OTHER): Payer: Medicare Other | Admitting: Family Medicine

## 2017-12-11 ENCOUNTER — Encounter: Payer: Self-pay | Admitting: Family Medicine

## 2017-12-11 VITALS — BP 150/78 | HR 74 | Temp 98.3°F | Ht 64.0 in | Wt 200.8 lb

## 2017-12-11 DIAGNOSIS — I700 Atherosclerosis of aorta: Secondary | ICD-10-CM

## 2017-12-11 DIAGNOSIS — R569 Unspecified convulsions: Secondary | ICD-10-CM | POA: Diagnosis not present

## 2017-12-11 DIAGNOSIS — R413 Other amnesia: Secondary | ICD-10-CM

## 2017-12-11 DIAGNOSIS — I10 Essential (primary) hypertension: Secondary | ICD-10-CM

## 2017-12-11 DIAGNOSIS — IMO0002 Reserved for concepts with insufficient information to code with codable children: Secondary | ICD-10-CM

## 2017-12-11 DIAGNOSIS — E118 Type 2 diabetes mellitus with unspecified complications: Secondary | ICD-10-CM | POA: Diagnosis not present

## 2017-12-11 DIAGNOSIS — E1165 Type 2 diabetes mellitus with hyperglycemia: Secondary | ICD-10-CM

## 2017-12-11 DIAGNOSIS — R6889 Other general symptoms and signs: Secondary | ICD-10-CM | POA: Diagnosis not present

## 2017-12-11 DIAGNOSIS — Z8673 Personal history of transient ischemic attack (TIA), and cerebral infarction without residual deficits: Secondary | ICD-10-CM

## 2017-12-11 LAB — POCT GLYCOSYLATED HEMOGLOBIN (HGB A1C): Hemoglobin A1C: 8.9 % — AB (ref 4.0–5.6)

## 2017-12-11 MED ORDER — INSULIN GLARGINE 100 UNIT/ML SOLOSTAR PEN: 15.0000 [IU] | PEN_INJECTOR | Freq: Every day | SUBCUTANEOUS | 3 refills | Status: DC

## 2017-12-11 NOTE — Assessment & Plan Note (Signed)
Declines DSME at this time. Improved A1c, brings helpful sugar log. Will slowly titrate lantus to goal 25u nightly assuming she tolerates titration. Reviewed titration schedule as per instructions. She has [facility] assistance coming out to house tomorrow.

## 2017-12-11 NOTE — Assessment & Plan Note (Addendum)
Continue statin, plavix.  

## 2017-12-11 NOTE — Progress Notes (Signed)
BP (!) 150/78 (BP Location: Right Arm, Patient Position: Sitting, Cuff Size: Large)   Pulse 74   Temp 98.3 F (36.8 C) (Oral)   Ht '5\' 4"'  (1.626 m)   Wt 200 lb 12 oz (91.1 kg)   SpO2 98%   BMI 34.46 kg/m    CC: 6 wk DM f/u visit Subjective:    Patient ID: Kristen [name], female    DOB: [birth year], 77 y.o.   MRN: [id number]  HPI: Kristen [name] is a 77 y.o. female presenting on 12/11/2017 for Diabetes (Here for 6 wk f/u. Pt provided recent BS log.)   Saw neurology after recent hospitalization with ?seizure - pt did not feel she had seizure. Had stable 72 hr EEG. Vimpat was started 74m bid, depakote decreased to 25169mbid, rec decrease tramadol use. vimpat was too expensive so lamictal was started - she stopped this due to itching. Now off all seizure medications. MMSE 11/2017 at neurology was 28/30.   HTN - Compliant with current antihypertensive regimen of amlodipine 69m61maily, hyzaar 100/12.69mg26mydralazine 50mg68m, metoprolol 12.69mg b71m- started by neurology but has not been taking. Does not check blood pressures at home. No low blood pressure readings or symptoms of dizziness/syncope. Denies HA, vision changes, CP/tightness, SOB, leg swelling.   DM - does regularly check sugars and brings log which was reviewed - fasting 120-180, pre dinner 150-250. Compliant with antihyperglycemic regimen which includes: lantus 15u at bedtime. Denies low sugars or hypoglycemic symptoms. Denies paresthesias. Last diabetic eye exam DUE. Pneumovax: 02/2015. Prevnar: 08/2013. Glucometer brand: one touch. DSME: declines. Lab Results  Component Value Date   HGBA1C 8.9 (A) 12/11/2017   Diabetic Foot Exam - Simple   No data filed     Lab Results  Component Value Date   MICROALBUR 7.8 (H) 08/09/2016     Relevant past medical, surgical, family and social history reviewed and updated as indicated. Interim medical history since our last visit reviewed. Allergies and medications reviewed and  updated. Outpatient Medications Prior to Visit  Medication Sig Dispense Refill  . amLODipine (NORVASC) 5 MG tablet Take 1 tablet (5 mg total) by mouth daily. 90 tablet 1  . atorvastatin (LIPITOR) 40 MG tablet Take 1 tablet (40 mg total) by mouth daily at 6 PM.    . blood glucose meter kit and supplies KIT Dispense based on patient and insurance preference. Use up to four times daily as directed. (FOR ICD-9 250.00, 250.01). 1 each 0  . Cholecalciferol (VITAMIN D) 2000 units CAPS Take 1 capsule by mouth daily.    . clopidogrel (PLAVIX) 75 MG tablet Take 1 tablet (75 mg total) by mouth daily. 90 tablet 3  . clotrimazole (LOTRIMIN AF) 1 % cream Apply 1 application topically 2 (two) times daily. Rash under breast 45 g 1  . Colchicine (MITIGARE) 0.6 MG CAPS Take 1 tablet by mouth daily as needed (for gout). 30 capsule 3  . glucose blood (ONE TOUCH ULTRA TEST) test strip USE UP TO 4 TIMES DAILY AS DIRECTED 100 each 3  . hydrALAZINE (APRESOLINE) 50 MG tablet Take 1 tablet (50 mg total) by mouth 2 (two) times daily. 180 tablet 1  . KLOR-CON 10 10 MEQ tablet TAKE 1 TABLET (10 MEQ TOTAL) BY MOUTH EVERY MONDAY, WEDNESDAY, AND FRIDAY. 30 tablet 1  . losartan-hydrochlorothiazide (HYZAAR) 100-12.5 MG tablet Take 1 tablet by mouth daily. 90 tablet 1  . nitroGLYCERIN (NITROSTAT) 0.4 MG SL tablet Place 0.4 mg under the  tongue every 5 (five) minutes as needed for chest pain.    [name] DELICA LANCETS FINE MISC USE U PTO 4 TIMES DAILY AS DIRECTED 100 each 2  . insulin glargine (LANTUS) 100 UNIT/ML injection Inject 0.15 mLs (15 Units total) into the skin at bedtime. 10 mL 11  . lamoTRIgine (LAMICTAL) 25 MG tablet Take 25 mg at night, increase every two weeks by 44m until taking 100 mg twice a day weekly instructions given to patient    . metoprolol tartrate (LOPRESSOR) 12.5 mg TABS tablet Take by mouth.     No facility-administered medications prior to visit.      Per HPI unless specifically indicated in ROS  section below Review of Systems     Objective:    BP (!) 150/78 (BP Location: Right Arm, Patient Position: Sitting, Cuff Size: Large)   Pulse 74   Temp 98.3 F (36.8 C) (Oral)   Ht '5\' 4"'  (1.626 m)   Wt 200 lb 12 oz (91.1 kg)   SpO2 98%   BMI 34.46 kg/m   Wt Readings from Last 3 Encounters:  12/11/17 200 lb 12 oz (91.1 kg)  10/29/17 202 lb 12 oz (92 kg)  09/27/17 202 lb 8 oz (91.9 kg)    Physical Exam  Constitutional: She appears well-developed and well-nourished. No distress.  HENT:  Head: Normocephalic and atraumatic.  Right Ear: External ear normal.  Left Ear: External ear normal.  Nose: Nose normal.  Mouth/Throat: Oropharynx is clear and moist. No oropharyngeal exudate.  Eyes: Pupils are equal, round, and reactive to light. Conjunctivae and EOM are normal. No scleral icterus.  Neck: Normal range of motion. Neck supple.  Cardiovascular: Normal rate, regular rhythm, normal heart sounds and intact distal pulses.  No murmur heard. Pulmonary/Chest: Effort normal and breath sounds normal. No respiratory distress. She has no wheezes. She has no rales.  Musculoskeletal: She exhibits no edema.  See HPI for foot exam if done  Lymphadenopathy:    She has no cervical adenopathy.  Skin: Skin is warm and dry. No rash noted.  Psychiatric: She has a normal mood and affect.  Nursing note and vitals reviewed.  Results for orders placed or performed in visit on 12/11/17  POCT glycosylated hemoglobin (Hb A1C)  Result Value Ref Range   Hemoglobin A1C 8.9 (A) 4.0 - 5.6 %   HbA1c POC (<> result, manual entry)     HbA1c, POC (prediabetic range)     HbA1c, POC (controlled diabetic range)     Lab Results  Component Value Date   HGBA1C 8.9 (A) 12/11/2017   HGBA1C 13.1 (H) 08/13/2017   HGBA1C 6.6 (H) 03/12/2017   Lab Results  Component Value Date   CREATININE 1.09 08/31/2017   BUN 16 08/31/2017   NA 142 08/31/2017   K 3.9 08/31/2017   CL 102 08/31/2017   CO2 33 (H) 08/31/2017        Assessment & Plan:  Labs at f/u AMW next visit Problem List Items Addressed This Visit    Thoracic aortic atherosclerosis ([place]    Continue statin, plavix.       Seizure ([facility]    Appreciate neuro care. S/p eval thought symptoms not consistent with stroke, had normal 72hr EEG. Now off all seizure medication.       Memory deficit   History of CVA (cerebrovascular accident)   Essential hypertension    Chronic, mildly elevated. Continue amlodipine, hyzaar, hydralazine, recent addition of metoprolol per neurology. Consider increased HCTZ  in hyzaar if persistently elevated BP next visit.       Diabetes mellitus type 2, uncontrolled, with complications ([place]) - Primary    Declines DSME at this time. Improved A1c, brings helpful sugar log. Will slowly titrate lantus to goal 25u nightly assuming she tolerates titration. Reviewed titration schedule as per instructions. She has [facility] assistance coming out to house tomorrow.       Relevant Medications   Insulin Glargine (LANTUS SOLOSTAR) 100 UNIT/ML Solostar Pen   Other Relevant Orders   POCT glycosylated hemoglobin (Hb A1C) (Completed)       Meds ordered this encounter  Medications  . Insulin Glargine (LANTUS SOLOSTAR) 100 UNIT/ML Solostar Pen    Sig: Inject 15 Units into the skin at bedtime.    Dispense:  5 pen    Refill:  3   Orders Placed This Encounter  Procedures  . POCT glycosylated hemoglobin (Hb A1C)    Follow up plan: Return in about 3 months (around 03/13/2018) for annual exam, prior fasting for blood work, medicare wellness visit.  [name], MD

## 2017-12-11 NOTE — Assessment & Plan Note (Addendum)
Appreciate neuro care. S/p eval thought symptoms not consistent with stroke, had normal 72hr EEG. Now off all seizure medication.

## 2017-12-11 NOTE — Patient Instructions (Addendum)
Thank you for checking sugar log - this really helps! Let's slowly increase lantus by 1 unit every 3 days if average morning sugar >130. Max lantus will be 25 units nightly.  Continue blood pressure medicines for now (amlodipine, hyzaar, hydralazine, metoprolol).  Return in 3 months for wellness visit

## 2017-12-11 NOTE — Assessment & Plan Note (Signed)
Chronic, mildly elevated. Continue amlodipine, hyzaar, hydralazine, recent addition of metoprolol per neurology. Consider increased HCTZ in hyzaar if persistently elevated BP next visit.

## 2017-12-12 ENCOUNTER — Other Ambulatory Visit: Payer: Self-pay | Admitting: *Deleted

## 2017-12-12 ENCOUNTER — Other Ambulatory Visit: Payer: Self-pay | Admitting: Pharmacist

## 2017-12-12 ENCOUNTER — Telehealth: Payer: Self-pay | Admitting: *Deleted

## 2017-12-12 ENCOUNTER — Encounter: Payer: Self-pay | Admitting: *Deleted

## 2017-12-12 MED ORDER — INSULIN GLARGINE 100 UNIT/ML SOLOSTAR PEN: 18.0000 [IU] | PEN_INJECTOR | Freq: Every day | SUBCUTANEOUS | 3 refills | Status: DC

## 2017-12-12 NOTE — Telephone Encounter (Signed)
Spoke to pharmacist, Almyra Free, who states pts lantus Rx was written for inject 15 units at bedtime but her AVS indicates she is to increase based on readings until she reaches 25 units. Pharmacist states she has never seen this type of titration before and is wanting to have it reviewed by PCP. If correct, pt is needing another Rx to cover the increase, otherwise, her insurance will not cover it. Almyra Free can be reached at [phone]

## 2017-12-12 NOTE — Patient Outreach (Signed)
[facility]) Care Management  [place] Routine Home Visit 12/12/2017  HARLEE ECKROTH Nov 12, 1940 [id number]  Mallissa Lorenzen Demonbreun is a 77 y.o. female referred to [place] by [facility] telephonic RN CM on original PCP referral; patient had recent hospitalization April 21-27, 2019 for acute CVA vs. seizure with subsequent expressive aphasia.Patient has history including, but not limited to, DM- Type II with Hgb A1-C >13; CAD with previous MI; COPD/ OSA; HTN/ HLD; GERD; and morbid obesity.HIPAA/ identity verified with patient during home visit today; [facility] Pharmacist Gerhard Perches is also present for part of visit; patient is home alone for today's visit; pleasant 70 minute visit.  Subjective: "I don't have time to be sick.  I want to get this right and get on with my life."  Assessment:  Billijo appears to continue recuperating well after her recent hospitalization. Lima has shown great progress in managing her insulin appropriately with [facility] RN CCM and Pharmacy home visits.  Nieves's blood sugar values at home have improved significantly and during her PCP office visit yesterday, her A1-C has decreased significantly, from 13.1 to 8.9!!!  Jami is very happy to see this improvement in her blood sugars, and she is beginning to understand the significance that A1-C value plays into overall self-health management of DM, although she continues to require face to face coaching around her level of understanding, due to lack of confidence around her recent stroke/ seizure.  Additionally, Shondell has worked with [facility] BSW for transportation needs through her insurance benefits, and she is now able to independently manage scheduling transportation to provider appointments. [facility] Pharmacist has been paramount in assisting patient with medication needs.  Due to recent changes made to her insulin regimen, [facility] CM outreach will continue, as Ruchy requires frequent reinforcement of education  provided due to her recent stroke that she feels has affected her ability to remember and retain information.  Due to this, Cayle will be most successful when her insulin dosing is kept simple and straightforward.  Today, patient reports that she "is doing great," and she denies pain, new/ recent falls, and appears to be in no apparent distress throughout home visit today.  Patient is relaxed and at ease throughout visit.   Patient further reports:  -- Attended PCP office provider visit yesterday- patient verbalizes confusion around new insulin dosing instructions-- [facility] Pharmacist [name] contacted PCP office, and eventually received new orders for patient to modify yesterday's instructions-- please see [place] notes for additional information.  Patient has been active with Daneen Schick, [facility] BSW regarding transportation needs to attend provider appointments; Tillie Rung assisted patient in arranging transportation needs through her insurance plan, and patient reports today that she has successfully/ independently arranged all recent transportation requests.  Positive reinforcement provided.  -- has and is taking all medications as prescribed-- as noted above, this is a joint home visit with [place]- please see [place] notes for additional information.  Almyra Free and I thoroughly discussed with patient using teach back method, rationale/ goal of new insulin instructions for ongoing management of DM/ reduction in A1-C level.  Self-health management ofrecent CVA, DM with elevated A1-C: --confirms thatshe hascontinuedmonitoring and recording blood sugars "2 or 3 times/ day;"reports that she took her recorded blood sugars to PCP office visit yesterday, and that PCP reviewed. -- encouraged patient to maintain practice of monitoring and recording blood sugars consistently and assisted in updating additional patient recording logs for her ongoing use through  September  2019  --  All recorded blood sugars at home were reviewed with patient today since 11/22/17:   Fasting ranges: 135- 154:  This is GREAT improvement over last home visits Post-prandial ranges: 180- 226: This is GREAT improvement over last home visits ** averages at home were calculated during home visit; averages range from 146-199, which is consistent with patient's A1-C value taken yesterday  -- reiterated/ re- reviewed with patient using teach back method, previously provided printed educational material re: concept/ significance/ value of A1-C level monitoring; viewed patient's personal trends with A1-C values over last year with her and posted this information on her refrigerator for her daily review.  I coached patient extensively in verbalizing significance of A1-C value; her past and current A1-C levels; and her goal A1-C; encouraged patient to continue reviewing education provided and for her to work toward building confidence in her ability to [employer] verbalize same.  -- reiterated using teach back method previously provided printed educational material re: signs/ symptoms low and high blood sugar with corresponding action plan: confirmed that patient has not experienced any of these signs/ symptoms since last [place] home visit  Patient denies further issues, concerns, or problems today. Iconfirmed that patient hasmy direct phone number, the main [facility] CM office phone number, and the [facility] CM 24-hour nurse advice phone number should issues arise prior to next scheduled [place] outreach with telephone outreach in 2 weeks and routine home visit next month.  [place] CCM home visit for next month scheduled with patient today and added to her personal calendar.  Encouraged patient to contact me directly if needs, questions, issues, or concerns arise prior to next scheduled outreachandpatient agreed to do so.  Objective:    BP 122/64   Pulse 68   Resp 18   SpO2 97%   Review of  Systems  Constitutional: Negative.   Respiratory: Negative.  Negative for cough, shortness of breath and wheezing.   Cardiovascular: Negative.  Negative for chest pain and leg swelling.  Gastrointestinal: Negative.  Negative for abdominal pain and nausea.  Musculoskeletal: Negative for falls, joint pain and myalgias.  Neurological: Negative for dizziness.  Psychiatric/Behavioral: Negative.  Negative for depression. The patient is not nervous/anxious.    Physical Exam  Constitutional: She is oriented to person, place, and time. She appears well-developed and well-nourished. No distress.  Respiratory: Effort normal. No respiratory distress.  Musculoskeletal: She exhibits no edema.  Neurological: She is alert and oriented to person, place, and time.  Skin: Skin is warm and dry.  Psychiatric: She has a normal mood and affect. Her behavior is normal. Judgment and thought content normal.   Encounter Medications:   Outpatient Encounter Medications as of 12/12/2017  Medication Sig Note  . amLODipine (NORVASC) 5 MG tablet Take 1 tablet (5 mg total) by mouth daily.   Marland Kitchen atorvastatin (LIPITOR) 40 MG tablet Take 1 tablet (40 mg total) by mouth daily at 6 PM.   . blood glucose meter kit and supplies KIT Dispense based on patient and insurance preference. Use up to four times daily as directed. (FOR ICD-9 250.00, 250.01).   . Cholecalciferol (VITAMIN D) 2000 units CAPS Take 1 capsule by mouth daily. 10/31/2017: Plans to reorder with [employer] OTC catalog for her Q3 benefit  . clopidogrel (PLAVIX) 75 MG tablet Take 1 tablet (75 mg total) by mouth daily.   . clotrimazole (LOTRIMIN AF) 1 % cream Apply 1 application topically 2 (two) times daily. Rash under breast   .  Colchicine (MITIGARE) 0.6 MG CAPS Take 1 tablet by mouth daily as needed (for gout).   Marland Kitchen glucose blood (ONE TOUCH ULTRA TEST) test strip USE UP TO 4 TIMES DAILY AS DIRECTED   . hydrALAZINE (APRESOLINE) 50 MG tablet Take 1 tablet (50 mg total) by  mouth 2 (two) times daily.   . Insulin Pen Needle (B-D UF III MINI PEN NEEDLES) 31G X 5 MM MISC Inject 1 each as directed at bedtime. Use as directed to inject insulin.  Dx code: E11.8, E11.65   . KLOR-CON 10 10 MEQ tablet TAKE 1 TABLET (10 MEQ TOTAL) BY MOUTH EVERY MONDAY, WEDNESDAY, AND FRIDAY.   Marland Kitchen losartan-hydrochlorothiazide (HYZAAR) 100-12.5 MG tablet Take 1 tablet by mouth daily.   . nitroGLYCERIN (NITROSTAT) 0.4 MG SL tablet Place 0.4 mg under the tongue every 5 (five) minutes as needed for chest pain. 10/17/2017: Has not needed   . ONETOUCH DELICA LANCETS FINE MISC USE U PTO 4 TIMES DAILY AS DIRECTED   . [DISCONTINUED] LANTUS SOLOSTAR 100 UNIT/ML Solostar Pen INJECT 15 UNITS INTO THE SKIN AT BEDTIME. (Patient taking differently: Per Dr. [name] OV 12/11/17, increase 1 unit every 3 days IF average morning sugar is >130. Max dose of 25 unit)    No facility-administered encounter medications on file as of 12/12/2017.    Functional Status:   In your present state of health, do you have any difficulty performing the following activities: 10/17/2017 08/12/2017  Hearing? N N  Vision? Y N  Comment "Some times, only occasionally" per patient report today -  Difficulty concentrating or making decisions? N Y  Walking or climbing stairs? N Y  Dressing or bathing? N Y  Doing errands, shopping? Y Y  Comment Does not drive, per patient report today-- family assists -  Conservation officer, nature and eating ? N -  Using the Toilet? N -  In the past six months, have you accidently leaked urine? N -  Do you have problems with loss of bowel control? N -  Managing your Medications? N -  Managing your Finances? N -  Housekeeping or managing your Housekeeping? N -  Comment family assists per patient report -  Some recent data might be hidden   Fall/Depression Screening:    Fall Risk  12/12/2017 12/04/2017 11/21/2017  Falls in the past year? (No Data) (No Data) (No Data)  Comment Denies new/ recent falls  Denies new/  recent falls No recent or new falls  Number falls in past yr: - - -  Comment - - -  Injury with Fall? - - -  Risk Factor Category  - - -  Risk for fall due to : - - -  Risk for fall due to: Comment - - -  Follow up Falls prevention discussed - -   PHQ 2/9 Scores 10/17/2017 09/28/2017 08/31/2017 08/09/2016 03/10/2015 12/11/2013  PHQ - 2 Score 0 1 0 0 0 0   Plan:  Patient will take medications as prescribed and will attend all scheduled provider appointments  Patient will continue monitoring and recording daily blood sugars 2-3 times/ day  Patient will continue reviewing educational material around A1-C values, as well as her personal trends and goals for A1-C  I will share today's [facility] RN CCM home visit notes with patient's PCP via secure messaging through [place] outreach to continue with scheduledphone call in 2 weeks and routinehome visitnext month  [facility] CM Care Plan Problem One     Most Recent  Value  Care Plan Problem One  Self-health management deficits around chronic disease state of DM, as evidenced by patient reporting of same and ongoing elevated A1-C levels  Role Documenting the Problem One  Care Management Coordinator  Care Plan for Problem One  Active  [facility] Long Term Goal   Over the next 45 days, patient will verbalize 3 self-health management strategies for chronic disease state of DM with elevated A1-C levels, as evidenced by patient reporting of same during [place] outreach [Goal modified/ extended]  [facility] Long Term Goal Start Date  12/12/17  Interventions for Problem One Long Term Goal  Using teach back method, continued ongoing education around patient's self-health management of DM,  today, we focused on new/ changes to insulin dosing around patient's recent PCP office visit,  extensively discussed and reviewed with patient rationale for ongoing changes in insulin dosing/ overall goals/ purpose of insulin in managing DM,  extensively reviewed with patient  recently recorded blood sugar trends at home,  updated home insulin logs for patient with upcoming dates and encouraged patient to remain diligent with monitoring/ recording daily blood sugars  [facility] CM Short Term Goal #1   Over the next 30 days, patient will be able to independently verbalize significance of A1-C level,  her personal/ most recent  A1-C level,  and her A1-C long-term goal, as evidenced by patient reporting during [facility] RN CCM outreach  [facility] CM Short Term Goal #1 Start Date  12/12/17  Interventions for Short Term Goal #1  Celebrated with patient her recent dramatic improvement in her A1-C level! using teachback method, reiterated previously provided education around significance of A1-C levels and posted this printed information on her refrigerator for her daily review,  discussed with patient her recent blood sugar readings at home, and explained by demonstration how her home averages correlate with her A1-C   [facility] CM Short Term Goal #2   Over the next 21 days, patient will attend all scheduled provider appointments, as evidenced by patient reporting and review of EMR/ collaboration with providers, as indicated  [facility] CM Short Term Goal #2 Start Date  11/21/17  [facility] CM Short Term Goal #2 Met Date  12/12/17 [Goal Met]  Interventions for Short Term Goal #2  Verified that patient has attended all recently scheduled provider appointments, and feels comfortable independently using insurance benefit to schedule transportation,  positive reinforcement provided     Oneta Rack, RN, BSN, [employer] Coordinator [facility] Care Management  [phone]

## 2017-12-12 NOTE — Telephone Encounter (Signed)
Spoke with pharmacist. Concern over pt titrating/averaging readings on her own. Will increase lantus to 18 u daily - new sig sent to pharmacy.  [facility] pharmacist will see pt again in 2 wks and review sugar log to ensure pt tolerating titration well.

## 2017-12-12 NOTE — Patient Outreach (Deleted)
[name]) Care Management  12/12/2017  Kristen [name] May 07, 1940 [id number]   New Lantus: A1c down FIVE points! Awesome!   Transportation is improving   Still upset about four BP medicines   Would a 20% increase to 18 un nighlty

## 2017-12-13 ENCOUNTER — Ambulatory Visit: Payer: Medicare Other | Admitting: *Deleted

## 2017-12-13 ENCOUNTER — Other Ambulatory Visit: Payer: Self-pay | Admitting: Pharmacist

## 2017-12-13 NOTE — Patient Outreach (Signed)
[place] [facility]) Care Management  12/13/2017  Chella Chapdelaine Losito [birth year] [id number]  77 year old female currently followed by [place], SW, and RN since June 2019.  Pharmacy services referred for medication management with insulin.  PMHx includes, but not limited, to hx CVA 4/'19, seizures, type 2 diabetes mellitus, CAD, HTN, hx DVT/PE, and gout.   Per review of EMR, patient has had significant improvement with  A1C in the last 3 months however still experiencing hyperglycemia in the morning.  Lantus dose titrated up from 15 units qPM --> 18 units qPM.  PCP unable to schedule office visit with patient until November and requests [facility] continue to assist with diabetes management until Lantus dose stabilized.  Patient currently checking CBGs BID.   Successful call placed to Ms. [name].  HIPAA identifiers verified. Patient reports she took Lantus 16 units last night but is aware to take Lantus 18 units starting tonight.  She states she was confused after all the insulin changes discussed yesterday.  She reports her CBG this AM was 152.  Patient verbalized understanding of new Lantus dose.  She is agreeable for me to follow-up with her next week regarding her insulin.  I provided my phone number to patient.  She is aware she can contact me at any time with questions or concerns.  Plan: I will follow-up with patient next week regarding diabetes management.   Ralene Bathe, PharmD, [place] [phone]

## 2017-12-13 NOTE — Patient Outreach (Signed)
[place] [facility]) Care Management  [place]   12/13/2017  Kristen [name] Dec 15, 1940 [id number]  77 y.o. year old female referred to [place] for Medication Management Referred to [facility] clinical pharmacy services for questions regarding Medicare Part D benefits. Initial pharmacy home visit conducted 10/17/17.   Patient with [employer].   Patient confirms identity with HIPAA-identifiers x2 and gives verbal consent to speak about medications. Visit conducted in patient's home, as a co-visit with Kristen [name].   Subjective:   Medication Adherence: Over the course of the last two months, Kristen [name] has done extremely well with medication adherence and creating her own medication system. Medication review today was correct. She is no longer taking Vimpat (DC'ed by Dr. [name] 11/28/2017).  Medication Management:  Planned on closing [place] episode today. However, Lantus dose was adjusted at Dr. [name] office visit yesterday. A1c is down over four points since April 2019 to 8.9% (excellent improvement). However, fasting BG still running slighlty high, 135-154 mg/dL. Patient expressed confusion over new Lantus dosing (increasing by 1 unit every 3 days if fasting average BG remained over 130 mg/dL).    Objective:   Encounter Medications: Outpatient Encounter Medications as of 12/12/2017  Medication Sig Note  . amLODipine (NORVASC) 5 MG tablet Take 1 tablet (5 mg total) by mouth daily.   Marland Kitchen atorvastatin (LIPITOR) 40 MG tablet Take 1 tablet (40 mg total) by mouth daily at 6 PM.   . blood glucose meter kit and supplies KIT Dispense based on patient and insurance preference. Use up to four times daily as directed. (FOR ICD-9 250.00, 250.01).   . Cholecalciferol (VITAMIN D) 2000 units CAPS Take 1 capsule by mouth daily. 10/31/2017: Plans to reorder with [employer] OTC catalog for her Q3 benefit  . clopidogrel (PLAVIX) 75 MG tablet  Take 1 tablet (75 mg total) by mouth daily.   . clotrimazole (LOTRIMIN AF) 1 % cream Apply 1 application topically 2 (two) times daily. Rash under breast   . Colchicine (MITIGARE) 0.6 MG CAPS Take 1 tablet by mouth daily as needed (for gout).   . hydrALAZINE (APRESOLINE) 50 MG tablet Take 1 tablet (50 mg total) by mouth 2 (two) times daily.   Marland Kitchen KLOR-CON 10 10 MEQ tablet TAKE 1 TABLET (10 MEQ TOTAL) BY MOUTH EVERY MONDAY, WEDNESDAY, AND FRIDAY.   Marland Kitchen losartan-hydrochlorothiazide (HYZAAR) 100-12.5 MG tablet Take 1 tablet by mouth daily.   Kristen [name] DELICA LANCETS FINE MISC USE U PTO 4 TIMES DAILY AS DIRECTED   . [DISCONTINUED] LANTUS SOLOSTAR 100 UNIT/ML Solostar Pen INJECT 15 UNITS INTO THE SKIN AT BEDTIME. (Patient taking differently: Per Dr. [name] OV 12/11/17, increase 1 unit every 3 days IF average morning sugar is >130. Max dose of 25 unit)   . glucose blood (ONE TOUCH ULTRA TEST) test strip USE UP TO 4 TIMES DAILY AS DIRECTED   . Insulin Pen Needle (B-D UF III MINI PEN NEEDLES) 31G X 5 MM MISC Inject 1 each as directed at bedtime. Use as directed to inject insulin.  Dx code: E11.8, E11.65   . nitroGLYCERIN (NITROSTAT) 0.4 MG SL tablet Place 0.4 mg under the tongue every 5 (five) minutes as needed for chest pain. 10/17/2017: Has not needed    No facility-administered encounter medications on file as of 12/12/2017.     Functional Status: In your present state of health, do you have any difficulty performing the following activities: 10/17/2017 08/12/2017  Hearing? N  N  Vision? Y N  Comment "Some times, only occasionally" per patient report today -  Difficulty concentrating or making decisions? N Y  Walking or climbing stairs? N Y  Dressing or bathing? N Y  Doing errands, shopping? Y Y  Comment Does not drive, per patient report today-- family assists -  Conservation officer, nature and eating ? N -  Using the Toilet? N -  In the past six months, have you accidently leaked urine? N -  Do you have  problems with loss of bowel control? N -  Managing your Medications? N -  Managing your Finances? N -  Housekeeping or managing your Housekeeping? N -  Comment family assists per patient report -  Some recent data might be hidden    Fall/Depression Screening: Fall Risk  12/12/2017 12/04/2017 11/21/2017  Falls in the past year? (No Data) (No Data) (No Data)  Comment Denies new/ recent falls  Denies new/ recent falls No recent or new falls  Number falls in past yr: - - -  Comment - - -  Injury with Fall? - - -  Risk Factor Category  - - -  Risk for fall due to : - - -  Risk for fall due to: Comment - - -  Follow up Falls prevention discussed - -   PHQ 2/9 Scores 10/17/2017 09/28/2017 08/31/2017 08/09/2016 03/10/2015 12/11/2013  PHQ - 2 Score 0 1 0 0 0 0    Assessment:  I agree with Dr. [name] goal of a slight increase in Lantus dosing to lower FBGs. However, patient is extremely worried with regimen, stating "I just got my mind right, and I don't want to get my mind all mixed up again". Furthermore, with the prescription sig, it is likely the patient would run out of Lantus before the pharmacy could refill. Placed call to Dr. [name] expressing concerns. Dr. [name] returned call to discuss a "flat" increase in Lantus dose.   Plan:  Lantus dose increase to 18 units SQ every evening. This is a 20% dose increase. Kristen [name] is to continue to record two BG daily, one fasting and one post prandial. [place] clinical pharmacy to reassess her blood sugars in 2 weeks to assess efficacy of the dose. Patient verbalized understanding of new dose, and verbally explained the new regimen to me using teach back method.   Follow up in 2 weeks.   Kristen [name], PharmD Clinical Pharmacist, [place] [facility] [phone]

## 2017-12-20 ENCOUNTER — Ambulatory Visit: Payer: Self-pay | Admitting: Pharmacist

## 2017-12-21 ENCOUNTER — Other Ambulatory Visit: Payer: Self-pay | Admitting: Pharmacist

## 2017-12-21 NOTE — Patient Outreach (Signed)
[place] [facility]) Care Management  12/21/2017  Kristen [name] 1940/08/13 [id number]  77 year old female followed by [place] Management team for diabetes medication management.  Patient is titrating insulin to goal fasting CBG < 130 mg/dL per MD.  Currently patient using 18 units Lantus qPM.   Successful call placed to Ms. Herbster today.  Patient reports she has been injecting 18 units of Lantus every evening and fasting CBGs in the last week have been 119 + 124.  She does not recall the other fasting CBG numbers but does think that one may have been > 130.  She reports she does not have her log book nearby and does not wish to go get it at this time to review.  Patient reports she has not had any low CBGs < 100.    Patient requests that I complete a home visit with her to review her CBGs.    Plan: Home visit scheduled for Tuesday, September 10th at 9:30 [place], PharmD, [place] [phone]

## 2017-12-25 ENCOUNTER — Ambulatory Visit: Payer: Self-pay

## 2017-12-25 ENCOUNTER — Other Ambulatory Visit: Payer: Self-pay

## 2017-12-25 NOTE — Patient Outreach (Signed)
[place] [facility]) Care Management  12/25/2017  Takerra Lupinacci Rotundo 08-09-1940 [id number]  BSW placed a call to the patient to follow-up on transportation needs. Unfortunately, the patient did not answer. BSW allowed the patients phone to ring several times, however, a voice mailbox did not initiate. BSW was unable to leave a HIPAA complaints voice message. BSW to call the patient again within the next four days.  Daneen Schick, BSW, CDP [facility] [phone]

## 2017-12-27 ENCOUNTER — Other Ambulatory Visit: Payer: Self-pay | Admitting: *Deleted

## 2017-12-27 NOTE — Patient Outreach (Signed)
[place] [facility]) Care Management [place] Telephone Outreach 12/27/2017  Kristen [name] [birth year] [id number]  Successful telephone outreach to Kristen [name] is a 77 y.o. female referred to [place] by [facility] telephonic RN CM on original PCP referral; patient had recent hospitalization April 21-27, 2019 for acute CVA vs. seizure with subsequent expressive aphasia.Patient has history including, but not limited to, DM- Type II with Hgb A1-C >13; CAD with previous MI; COPD/ OSA; HTN/ HLD; GERD; and morbid obesity.HIPAA/ identity verified with patient during phone call today.  Today, patient reports that she "is doingjust fine," and she denies pain, new/ recent falls, and sounds to be in no apparent distress throughout phone call today.  Patient further reports:  -- No recent provider appointments; upcoming provider appointments were reviewed with patient, who verbalizes plans to attend all  -- has and is taking all medications as prescribed-- patient is able to accurately verbalize new insulin dosing and reports that she has been taking insulin as prescribed.  Denies questions/ concerns around medications; again states that she would eventually like to "get off of insulin completely."  Confirmed that patient continues working with [place] team, and encouraged her ongoing active participation with pharmacy team.  Self-health management ofrecent CVA, DM with elevated A1-C: --confirms thatshe hascontinuedmonitoring and recording blood sugars "2 or 3 times/ day;"reports that she has noticed that her blood sugars at home "keep coming down;" reviewed all recent blood sugars with patient today, and she reports fasting blood sugar ranges "all under 150;" reports fasting blood sugar this morning of "133."  -- reiterated/ re- reviewed with patient using teach back method, previously provided printed educational material re: concept/ significance/ value of A1-C  level monitoring; patient is able to discuss basic significance of A1-C level, and understands that her A1-C will be checked/ monitored by PCP "about every 3 months."  Patient continues to require coaching around verbalizing her own person values and goals-- patient was coached and provided positive reinforcement and encouragement as she continues learning.  Again encouraged patient to continue reviewing education provided and for her to work toward building confidence in her ability to [employer] verbalize same.  -- confirmed that patient has not experienced any recent signs/ symptoms hypo- hyper glycemia   Patient denies further issues, concerns, or problems today. Iconfirmed that patient hasmy direct phone number, the main [facility] CM office phone number, and the [facility] CM 24-hour nurse advice phone number should issues arise prior to next scheduled [place] outreach with routine home visit in 2 1/2 weeks.  Discussed with patient that she has made great progress in meeting her previously established [place] CM goals, and introduced the concept of eventual transfer to [place] for ongoing education of self-health management of DM-- patient verbalizes that she continues having a lack of confidence around talking on the phone to people she has never met before; encouragement was provided, and I assured patient that [place] team would remain involved with patient to assist in this eventual transfer of care.  Encouraged patient to contact me directly if needs, questions, issues, or concerns arise prior to next scheduled outreachandpatient agreed to do so.  Plan:  Patient will take medications as prescribed and will attend all scheduled provider appointments  Patient will continue monitoring and recording daily blood sugars 2-3 times/ day  Patient will continue reviewing educational material around A1-C values, as well as her personal trends and goals for A1-C  [facility]  Community CM outreach to continue with scheduledroutine home visit in 2 1/2 weeks   [facility] CM Care Plan Problem One     Most Recent Value  Care Plan Problem One  Self-health management deficits around chronic disease state of DM, as evidenced by patient reporting of same and ongoing elevated A1-C levels  Role Documenting the Problem One  Care Management Coordinator  Care Plan for Problem One  Active  [facility] Long Term Goal   Over the next 45 days, patient will verbalize 3 self-health management strategies for chronic disease state of DM with elevated A1-C levels, as evidenced by patient reporting of same during [place] outreach  [place] Term Goal Start Date  12/12/17  Interventions for Problem One Long Term Goal  Confirmed that patient continues monitoring and recording blood sugars consistently at home,  reviewed all recently recorded blood sugar ranges with patient,  initiated conversation with patient around eventual transfer to [place],  provided positive reinforcement to patient for consistently monitoring blood sugars and confirmed that she is able to verbalize accurate report of current insulin dosing  [facility] CM Short Term Goal #1   Over the next 20 days, patient will be able to independently verbalize significance of A1-C level,  her personal/ most recent  A1-C level,  and her A1-C long-term goal, as evidenced by patient reporting during [facility] RN CCM outreach [Goal extended today]  [facility] CM Short Term Goal #1 Start Date  12/12/17  Interventions for Short Term Goal #1  Using teach back method, coached patient and reiterated previously provided education around A1-C values and significance in managing chronic disease state of DM     Kristen Rack, RN, BSN, [employer] Coordinator [facility] Care Management  [phone]

## 2017-12-31 ENCOUNTER — Other Ambulatory Visit: Payer: Self-pay

## 2017-12-31 NOTE — Patient Outreach (Signed)
[place] [facility]) Care Management  12/31/2017  Kristen [name] 16-Nov-1940 [id number]  Successful outreach to the patient on today's date, HIPAA identifiers confirmed. The patient reports continued success with accessing and utilizing her transportation benefit. BSW completed a social determinants screen which indicated no other concerns surrounding financial or housing barriers. The patient reports her main concerns is "forgetting my medications". BSW reminded the patient that both [facility] RNCM and Pharmacist will continue to be active with her case. BSW to perform a discipline closure on today's date. The patient is in agreement with this and is aware she may call this BSW if future social work needs arise. BSW to alert other members of the care team of today's discipline closure.  Daneen Schick, BSW, CDP [facility] [phone]

## 2018-01-01 ENCOUNTER — Telehealth: Payer: Self-pay

## 2018-01-01 ENCOUNTER — Other Ambulatory Visit: Payer: Self-pay | Admitting: Pharmacist

## 2018-01-01 ENCOUNTER — Other Ambulatory Visit: Payer: Self-pay | Admitting: Family Medicine

## 2018-01-01 MED ORDER — ATORVASTATIN CALCIUM 40 MG PO TABS: 40.0000 mg | ORAL_TABLET | Freq: Every day | ORAL | 3 refills | Status: DC

## 2018-01-01 NOTE — Telephone Encounter (Signed)
Copied from [place] [phone]. Topic: General - Other >> Jan 01, 2018 10:27 AM Alfredia Ferguson R wrote: [facility] Nurse calling in and stated in about 3 week her fasting blood sugar averaged 126.8 and she is using lantus 18u. Before dinner average is 156. If PCP wants to make any changes call [facility] at  [phone]

## 2018-01-01 NOTE — Telephone Encounter (Signed)
Wonderful - these readings are much better. Continue current regimen.

## 2018-01-01 NOTE — Telephone Encounter (Signed)
Plavix Last filled:  07/27/17, #90 Last OV:  12/11/17, f/u Next OV:  03/18/18, CPE

## 2018-01-01 NOTE — Telephone Encounter (Signed)
lipitor refilled at 40mg  dose

## 2018-01-01 NOTE — Telephone Encounter (Signed)
Called and spoke with patient informing her of recommendations. Understanding verbalized nothing further needed at this time.  

## 2018-01-01 NOTE — Patient Outreach (Signed)
[place] [facility]) Care Management  01/01/2018  Kristen [name] [birth year] [id number]  Home visit with Ms. Zaugg today to review blood glucose readings with recent insulin titration plan per Dr. [name]. Patient is titrating long-acting insulin to goal fasting CBG < 130 mg/dL per MD.  Currently patient using 18 units Lantus qPM.   Subjective:  Patient reports that she has confusion at times and can't remember what medications she has put in her pill planner.  She reports she thinks she is supposed to pick up medication from her pharmacy but can't recall the names.  [facility] reports [facility] because it is a Management consultant in case she travels and forgets her medications, then later states she has not traveled in 2 years.  She reports she rarely checks her blood pressure and is not interested in writing down results as she is already recording CBGs 2x daily.    Objective:  Hemoglobin A1C 13.1 --> 8.9 (12/11/17) Blood pressure: 123/78, HR 58 Average fasting CBG from 12/13/17 - 01/01/18 = 127 Average pre dinner CBG from 12/13/17 - 01/01/18 = 156  Medications Reviewed Today    Reviewed by Rudean Haskell, RPH (Pharmacist) on 01/01/18 at [place] List Status: <None>  Medication Order Taking? Sig Documenting Provider Last Dose Status Informant  amLODipine (NORVASC) 5 MG tablet [id number] Yes Take 1 tablet (5 mg total) by mouth daily. [name], MD Taking Active   atorvastatin (LIPITOR) 40 MG tablet [id number] No Take 1 tablet (40 mg total) by mouth daily at 6 PM.  Patient not taking:  Reported on 01/01/2018   Demetrios Loll, MD Not Taking Active Self           Med Note Kary Kos, Blair Heys   Wed Oct 31, 2017  1:27 PM)    blood glucose meter kit and supplies KIT [id number] Yes Dispense based on patient and insurance preference. Use up to four times daily as directed. (FOR ICD-9 250.00, 250.01). Elby Beck, FNP Taking Active Self  Cholecalciferol (VITAMIN D) 2000  units CAPS [id number] No Take 1 capsule by mouth daily. [provider] Not Taking Active Self           Med Note Kary Kos, JULIE E   Wed Oct 31, 2017  4:21 PM) Plans to reorder with [employer] OTC catalog for her Q3 benefit  clopidogrel (PLAVIX) 75 MG tablet [id number] Yes Take 1 tablet (75 mg total) by mouth daily. [name], MD Taking Active Self  clotrimazole (LOTRIMIN AF) 1 % cream [id number] Yes Apply 1 application topically 2 (two) times daily. Rash under breast [name], MD Taking Active Self  Colchicine (MITIGARE) 0.6 MG CAPS [id number] Yes Take 1 tablet by mouth daily as needed (for gout). [name], MD Taking Active   glucose blood (ONE TOUCH ULTRA TEST) test strip [id number] Yes USE UP TO 4 TIMES DAILY AS DIRECTED [name], MD Taking Active Self  hydrALAZINE (APRESOLINE) 50 MG tablet [id number] Yes Take 1 tablet (50 mg total) by mouth 2 (two) times daily. [name], MD Taking Active   Insulin Glargine (LANTUS SOLOSTAR) 100 UNIT/ML Solostar Pen [id number] Yes Inject 18 Units into the skin at bedtime. Qs 3 mo [name], MD Taking Active   Insulin Pen Needle (B-D UF III MINI PEN NEEDLES) 31G X 5 MM MISC [id number] Yes Inject 1 each as directed at bedtime. Use as directed to inject insulin.  Dx code: E11.8, E11.65 [provider] Taking Active  KLOR-CON 10 10 MEQ tablet [id number] Yes TAKE 1 TABLET (10 MEQ TOTAL) BY MOUTH EVERY MONDAY, WEDNESDAY, AND FRIDAY. [name], MD Taking Active Self           Med Note Kary Kos, Garnetta Buddy Nov 15, 2017  4:56 PM)    losartan-hydrochlorothiazide [facility]) 100-12.5 MG tablet [id number] Yes Take 1 tablet by mouth daily. [name], MD Taking Active   nitroGLYCERIN (NITROSTAT) 0.4 MG SL tablet [id number] Yes Place 0.4 mg under the tongue every 5 (five) minutes as needed for chest pain. [provider] Taking Active Self           Med Note Kary Kos, Coral Spikes Oct 17, 2017   2:16 PM) Has not needed   [place] [id number] Yes USE U PTO 4 TIMES DAILY AS DIRECTED [name], MD Taking Active Self          Assessment:   Drugs sorted by system:  Cardiovascular: amlodipine, atorvastatin, clopidogrel, hydralazine, losartan-HCTZ, PRN SL NTG  Endocrine:insulin glargine (Lantus)  Topical: clotrimazole  Vitamins/Minerals: cholecalciferol, potassium  Miscellaneous: colchicine  Medication Management:  1) Patient taking double dose of losartan-HCTZ daily.  Pharmacy recently filled prescription for losartan-HCTZ with a new manufacturer and patient unaware of change therefore continued to fill pill planner with both old and new formulations.    -Blood pressure taken today at home using wrist monitor = 123/78, HR 58.  -I removed old formulation with patient's permission and counseled her on making sure to read names of pill bottles closely when filling pill planner.   -I strongly encouraged patient to start using compliance packs which will greatly reduce chance of error happening again.  I reviewed that prescriptions can be transferred to local pharmacy that will pack and deliver medications at no charge directly to her home.  Patient hesitant to chance pharmacies at this time due to past experiences of traveling and having difficulty transferring medications to another state.   I will request PCP to also encourage this option as patient will likely continue to make errors with medications in the future based on frequent confusion with medications.   2) No atorvastatin medication in the home.  Per pharmacy records, last filled 08/23/2017 for 30 DS.  1 refill remaining.  Pharmacy will fill for patient and request refill from Dr. [name].   3) Not taking clopidogrel correctly, only has 4 tablets remaining at home.  Per pharmacy records, last filled 07/27/2017 for 90 DS.  If taken as directed, patient should have run out in July.  No refills  remaining.  Pharmacy will request refill from Dr. [name].   4) Metoprolol bottle at home but patient states she is not taking this.  Medication not on current medication list in [facility].  Per review of Care Everywhere, medication added for anxiety by neurology.  Patient declines needing it and states she will not be returning to this provider.  Medication removed from home with permission.   5) Lamictal bottle at home but patient states she is not taking this.  Medication not on current medication list in [facility].  Per review of Care Everywhere, medication added by neurology.  Patient declines need it and states she will not be returning to this provider.  Medication removed from home with permission.    Diabetes management:   Fasting blood sugars at goal < 130 mg/dL.  Pre-dinner blood sugars have also decreased significantly.  Next A1C due in November.  Message left with Dr. [name] to relay update on blood sugars and to request recommendations for patient with insulin dose.    I provided patient with [place] with additional areas to record CBGs as patient running low on her paper log.  Also reviewed area to record blood pressure and encouraged patient to check frequently as this will assist PCP with therapy decisions.   Plan: I will route note to Dr. [name] and await treatment recommendations.   Ralene Bathe, PharmD, [place] [phone]

## 2018-01-01 NOTE — Telephone Encounter (Signed)
Noted  

## 2018-01-01 NOTE — Addendum Note (Signed)
Addended by: [name] on: 01/01/2018 05:24 PM   Modules accepted: Orders

## 2018-01-02 ENCOUNTER — Other Ambulatory Visit: Payer: Self-pay | Admitting: Family Medicine

## 2018-01-08 ENCOUNTER — Ambulatory Visit: Payer: Self-pay | Admitting: Pharmacist

## 2018-01-08 ENCOUNTER — Other Ambulatory Visit: Payer: Self-pay | Admitting: Pharmacist

## 2018-01-08 NOTE — Patient Outreach (Signed)
[facility]) Care Management  01/08/2018  Kristen [name] March 18, 1941 [id number]   Successful call to Kristen [name] today.  HIPAA identifiers verified.   1) Patient reports she is doing well with her blood sugars and continues to use Lantus 18units qPM.  She states her "numbers" are in the "100s" but cannot recall a specific number in the last few days.  Patient has not yet checked her CBG today.    2) Patient reports she is not regularly checking her blood pressure and does not recall any readings however remembers that the color was "yellow" which indicates elevated blood pressure.  I encouraged patient to check blood pressure regularly which will help her provider know if medications need to be adjusted.   3) Patient reports she has not thought about compliance packaging since our last visit.  She states she is agreeable to start using it "some day but not right now."  She reports she has not picked up her refills of Lipitor or Plavix yet.  She confirms that she has run out of her Plavix in her pill container.  She is planning on going to the pharmacy today to pick up these medications.  I reviewed benefits of compliance packaging again with patient including lining up all medications on the same refill schedule, assistance with organizing all medications in correct time of day slot, and reduced chance for errors with duplication or correctly reading bottles for instructions.  Patient again declines this service.    Plan: I will follow-up with [facility] RN after scheduled visit next week.  Patient would greatly benefit from compliance packaging but has declined this service multiple times.  I will plan on closing [place] case after next telephonic outreach with Kristen [name].   Ralene Bathe, PharmD, [place] [phone]

## 2018-01-15 ENCOUNTER — Encounter: Payer: Self-pay | Admitting: *Deleted

## 2018-01-15 ENCOUNTER — Other Ambulatory Visit: Payer: Self-pay | Admitting: *Deleted

## 2018-01-15 ENCOUNTER — Other Ambulatory Visit: Payer: Self-pay | Admitting: Pharmacist

## 2018-01-15 NOTE — Patient Outreach (Signed)
[facility]) Care Management  [place] Routine Home Visit Care Coordination outreach x 1 01/15/2018  Nalah Macioce Celli 02-Jun-1940 [id number]  Kristen [name] is an 77 y.o. female referred to [place] by [facility] telephonic RN CM on original PCP referral; patient had recent hospitalization April 21-27, 2019 for acute CVA vs. seizure with subsequent expressive aphasia.Patient has history including, but not limited to, DM- Type II with Hgb A1-C >13; CAD with previous MI; COPD/ OSA; HTN/ HLD; GERD; and morbid obesity.HIPAA/ identity verified with patient during home visittoday; patient is home alone for today's visit; pleasant 70 minute home visit.  Subjective: "I'm feeling good and want to keep it that way... I don't have time to be sick.... I want to stay well and hope I can eventually get off of insulin all together."  Assessment:  Annalicia continuesrecuperating well after her hospitalization earlier this year, and has continued showing great progress in managing her insulin accurately and appropriately.  Semira continues monitoring and recording her blood sugar levels at home twice daily, and these values have shown excellent improvement with a significant decrease in her last A1-C level since [place] CM involvement in her care.  Sharnice understands the significance of the A1-C level in self-health management of chronic disease state of DM.  Additionally, Penni understands the importance of adherence to her overall plan of care around self-health management of DM, and she is committed to adherence to attending provider appointments and to taking her medications as prescribed.  [place] team and [facility] CSW teams have now signed off, as patient has met her previously established goals around medication management and transportation resources.  Due to her recent stroke, [place] continues to prefer face to face coaching around self-health management of DM, although she has  agreed to transfer to [place] warm hand-ff at time of next scheduled [place] home visit.  Meriam is interested in [place] more about dietary strategies for management of DM; she continues to require frequent reinforcement of education provided due to her recent stroke that she feels has affected her ability to remember and retain information.  Jacelynn will be most successful when her insulin dosing and overall plan of care is kept simple and straightforward.  Today, patient again reports that she "is doinggreat," and she denies pain, new/ recent falls, and appears to be in no apparent distress throughout home visit today.  Patient is relaxed and at ease throughout home visit.  Pleasant 70 minute visit with patient.   Patient further reports:  -- No recent provider appointments; all upcoming provider appointments reviewed with patient and added to her home calendar; patient again reports that she has successfully/ independently arranged all recent transportation requests by using her insurance transportation benefit.  Positive reinforcement provided.  -- has and is taking all medications as prescribed-- all medications were reviewed with patient during home visit today, and no discrepancies or concerns were identified.  Discussed possibility of patient obtaining compliance packaging for her medications, as patient had previously discussed with [facility] Pharmacist Ralene Bathe; patient again declines this option, and she is able to verbalize a good general understanding of the purpose, dosing, and scheduling of her medications.  Patient's pill box was reviewed, and there were no concerns identified.  Self-health management ofrecent DM with elevated A1-C: --confirms thatshe hascontinuedmonitoring and recording blood sugars "2 or 3 times/ day;"reminded patient to always take her recorded blood sugar log from home to PCP  office visits, and she verbalizes understanding and  agreement. -- recorded home blood sugars reviewed from time of last [place] home visit: sugars have ranged from 101-153 fasting and from 140-197 post-prandial, with an isolated high reading of 286 on one evening; patient reports that on that evening, she had an "uncontrollable craving for chocolate and gave in;" all recorded blood sugars after this one indiscretion were back in normal ranges -- encouraged patient to maintain practice of monitoring and recording blood sugars consistently as an important part of self-health management of chronic disease state of DM; patient and I also discussed other strategies, including attending scheduled provider appointments and taking all medications as prescribed; I again assisted in updating additional patient recording logs for her ongoing use at home through October 2019 -- using teach back method, confirmed that patient is able to independently verbalize significance/ value of A1-C level monitoring; her most current A1-C level, and her A1-C goal; positive reinforcement provided that patient now seems to understand the value/ significance/ importance of A1-C monitoring -- using teach back method, provided and reviewed with [place] printed EMMI educational material around dietary strategies for blood sugar control; encouraged patient to review this new material, along side previously provided booklet "Living Well with Diabetes;" we discussed value of patient increasing her knowledge around food choices in setting of DM, and I encouraged her to begin reviewing foods to eat that are good for blood sugar control.  Patient is not at all interested in learning how to read food labels, stating, "that is over my head," and adding that she "doesn't want to mess with all that" while grocery shopping.   Patient denies further issues, concerns, or problems today. Iconfirmed that patient hasmy direct phone number, the main [facility] CM office phone number, and the [facility] CM  24-hour nurse advice phone number should issues arise prior to next scheduled [place] outreach, with last scheduled [name] home visit next month for warm transfer to [place]; patient is agreeable to [facility] CM transfer to RN health Coach, "as long as I can meet in person first." Discussed with patient role of [place] CM and that she has made excellent progress in meeting her previously established goals, and denies ongoing care coordination needs, and I encouraged her to actively engage with [place] once this transition has been made.  Encouraged patient to contact me directly if needs, questions, issues, or concerns arise prior to next scheduled outreachandpatient agreed to do so.  11:00 am: Care Coordination Outreach to Johny Shock, [facility] RN Health Coach: contacted Joaquim Lai prior to scheduled visit with patient to confirm that patient is included in her geographical territory; discussed overall patient case with Joaquim Lai along with possibility of making joint home visit for warm transfer to [place] at time of final [place] home visit; Joaquim Lai agreed to complete warm hand-off of patient to transfer care to [employer] RN health coach, and we discussed options for dates in October; sent subsequent secure e-mail to Joaquim Lai confirming that patient has agreed to warm transfer/ hand off on February 07, 2018.   Objective:    BP 124/62   Pulse 60   Resp 18   SpO2 95%   Review of Systems  Constitutional: Negative.   Respiratory: Negative.  Negative for cough, shortness of breath and wheezing.   Cardiovascular: Negative.  Negative for chest pain and leg swelling.  Gastrointestinal: Negative.  Negative for abdominal  pain and nausea.  Genitourinary: Negative.  Negative for frequency and urgency.  Musculoskeletal: Negative.  Negative for back pain, falls and myalgias.  Neurological: Negative.   Psychiatric/Behavioral: Negative.  Negative for  depression. The patient is not nervous/anxious.    Physical Exam  Constitutional: She is oriented to person, place, and time. She appears well-developed and well-nourished. No distress.  Cardiovascular: Normal rate.  Respiratory: Effort normal. No respiratory distress.  Musculoskeletal: She exhibits no edema.  Neurological: She is alert and oriented to person, place, and time.  Skin: Skin is warm and dry.  Psychiatric: She has a normal mood and affect. Her behavior is normal. Judgment and thought content normal.   Encounter Medications:   Outpatient Encounter Medications as of 01/15/2018  Medication Sig Note  . amLODipine (NORVASC) 5 MG tablet Take 1 tablet (5 mg total) by mouth daily.   Marland Kitchen atorvastatin (LIPITOR) 40 MG tablet Take 1 tablet (40 mg total) by mouth daily.   . Cholecalciferol (VITAMIN D) 2000 units CAPS Take 1 capsule by mouth daily. 10/31/2017: Plans to reorder with [employer] OTC catalog for her Q3 benefit  . clopidogrel (PLAVIX) 75 MG tablet TAKE 1 TABLET BY MOUTH EVERY DAY   . clotrimazole (LOTRIMIN AF) 1 % cream Apply 1 application topically 2 (two) times daily. Rash under breast 01/15/2018: Has not needed recently  . Colchicine (MITIGARE) 0.6 MG CAPS Take 1 tablet by mouth daily as needed (for gout). 01/15/2018: Has not needed recently  . hydrALAZINE (APRESOLINE) 50 MG tablet Take 1 tablet (50 mg total) by mouth 2 (two) times daily.   . Insulin Glargine (LANTUS SOLOSTAR) 100 UNIT/ML Solostar Pen Inject 18 Units into the skin at bedtime. Qs 3 mo   . KLOR-CON 10 10 MEQ tablet TAKE 1 TABLET (10 MEQ TOTAL) BY MOUTH EVERY MONDAY, WEDNESDAY, AND FRIDAY.   Marland Kitchen losartan-hydrochlorothiazide (HYZAAR) 100-12.5 MG tablet Take 1 tablet by mouth daily.   . nitroGLYCERIN (NITROSTAT) 0.4 MG SL tablet Place 0.4 mg under the tongue every 5 (five) minutes as needed for chest pain. 01/15/2018: Has not needed recently   . blood glucose meter kit and supplies KIT Dispense based on patient and insurance  preference. Use up to four times daily as directed. (FOR ICD-9 250.00, 250.01).   Marland Kitchen glucose blood (ONE TOUCH ULTRA TEST) test strip USE UP TO 4 TIMES DAILY AS DIRECTED   . Insulin Pen Needle (B-D UF III MINI PEN NEEDLES) 31G X 5 MM MISC Inject 1 each as directed at bedtime. Use as directed to inject insulin.  Dx code: E11.8, E11.65   . ONETOUCH DELICA LANCETS FINE MISC USE U PTO 4 TIMES DAILY AS DIRECTED    No facility-administered encounter medications on file as of 01/15/2018.    Functional Status:   In your present state of health, do you have any difficulty performing the following activities: 10/17/2017 08/12/2017  Hearing? N N  Vision? Y N  Comment "Some times, only occasionally" per patient report today -  Difficulty concentrating or making decisions? N Y  Walking or climbing stairs? N Y  Dressing or bathing? N Y  Doing errands, shopping? Y Y  Comment Does not drive, per patient report today-- family assists -  Conservation officer, nature and eating ? N -  Using the Toilet? N -  In the past six months, have you accidently leaked urine? N -  Do you have problems with loss of bowel control? N -  Managing your Medications? N -  Managing your  Finances? N -  Housekeeping or managing your Housekeeping? N -  Comment family assists per patient report -  Some recent data might be hidden   Fall/Depression Screening:    Fall Risk  01/15/2018 12/27/2017 12/12/2017  Falls in the past year? (No Data) (No Data) (No Data)  Comment Denies new/ recent falls Patient denies new/ recent falls Denies new/ recent falls   Number falls in past yr: - - -  Comment - - -  Injury with Fall? - - -  Risk Factor Category  - - -  Risk for fall due to : - - -  Risk for fall due to: Comment - - -  Follow up Falls prevention discussed - Falls prevention discussed   PHQ 2/9 Scores 01/15/2018 10/17/2017 09/28/2017 08/31/2017 08/09/2016 03/10/2015 12/11/2013  PHQ - 2 Score 0 0 1 0 0 0 0   Plan:  Patient will take medications as  prescribed and will attend all scheduled provider appointments  Patient will continue monitoring and recording daily blood sugars 2-3 times/ day  Patient will begin reviewing educational material provided today around dietary strategies for blood sugar control   I will share today's [facility] RN CCM home visit notes and care plan with patient's PCP via secure messaging through EMR, as quarterly update  [place] outreach to continue with [place] home visit for warm transfer to [place] next month  [facility] CM Care Plan Problem One     Most Recent Value  Care Plan Problem One  Self-health management deficits around chronic disease state of DM, as evidenced by patient reporting of same and ongoing elevated A1-C levels  Role Documenting the Problem One  Care Management [place] for Problem One  Active  [facility] Long Term Goal   Over the next 24 days, patient will verbalize 3 self-health management strategies for chronic disease state of DM with elevated A1-C levels, as evidenced by patient reporting of same during [place] outreach [Goal extended around next outreach to patient]  [place] Term Goal Start Date  01/15/18 [Goal modified today aorund next outreach with pt.]  [facility] CM Short Term Goal #1   Over the next 20 days, patient will be able to independently verbalize significance of A1-C level,  her personal/ most recent  A1-C level,  and her A1-C long-term goal, as evidenced by patient reporting during [facility] RN CCM outreach [Goal extended today]  [facility] CM Short Term Goal #1 Start Date  12/12/17  [facility] CM Short Term Goal #1 Met Date  01/15/18 [Goal met]  Interventions for Short Term Goal #1  Confirmed that patient is able to verbalize accurate understanding of A1-C level and is able to verbalize her current A1-C and her goal A1-C  [facility] CM Short Term Goal #2   Over the next 24 days, patient will begin reviewing printed EMMI educational material around dietary meal  planning in setting of DM, as evidenced by patient reporting during [place] CCM outreach  [facility] CM Short Term Goal #2 Start Date  01/15/18  Interventions for Short Term Goal #2  Discussed with patient that she has thus far met her previously established [facility] CM goals,  discussed value of ongoing education around diet as additional strategy for self-health management of DM,  using teach back method, reviewed and discussed with patient printed EMMI educational material around meal planning  and dietary choices, which were provided to patient today.  Discussed with patient  plan to transfer care to [place] at time of next [place] outreach/ home visit     Oneta Rack, RN, BSN, [place] Coordinator [facility] Care Management  [phone]

## 2018-01-15 NOTE — Patient Outreach (Signed)
[facility] ([facility]) Care Management  01/15/2018  Kristen [name] 03/13/1941 [id number]  77 y.o. year old female referred to [facility] pharmacy for medication management.   Care coordination call with [facility] RN regarding home visit with patient this morning.  RN reports home visit went well and that patient picked up refills on atorvastatin and clopidogrel.  Patient able to verbalize how to take medications correctly and no issues found with current pillbox system.  RN encouraged use of compliance packs but patient declined.   Successful call to Kristen [name] today.  Patient reports she is doing fine with her insulin and oral medications.  She is aware of plans to transfer care to health coach and has requested to meet face to face with health coach to establish relationship.  Patient denies having any medication related concerns at this time.    [facility] pharmacy case is being closed due to the following reasons:  Goals have been met.  Patient has been provided [facility] CM contact information if assistance needed in the future.    Thank you for allowing [facility] pharmacy to be involved in this patient's care.      , PharmD, BCPS Clinical Pharmacist [facility] [phone]         

## 2018-01-16 ENCOUNTER — Other Ambulatory Visit: Payer: Self-pay | Admitting: Family Medicine

## 2018-02-04 ENCOUNTER — Encounter: Payer: Self-pay | Admitting: Family Medicine

## 2018-02-04 DIAGNOSIS — Z79899 Other long term (current) drug therapy: Secondary | ICD-10-CM | POA: Insufficient documentation

## 2018-02-07 ENCOUNTER — Encounter: Payer: Self-pay | Admitting: *Deleted

## 2018-02-07 ENCOUNTER — Other Ambulatory Visit: Payer: Self-pay | Admitting: *Deleted

## 2018-02-07 NOTE — Patient Outreach (Signed)
[name]) Care Management  [place] Routine Home Visit Referral to [place]  02/07/2018  Nocole Zammit [name] [birth year] [id number]  Kristen [name] is an 77 y.o. female referred to [name] by [name] telephonic RN CM on original PCP referral; patient had recent hospitalization April 21-27, 2019 for acute CVA vs. seizure with subsequent expressive aphasia.Patient has history including, but not limited to, DM- Type II with Hgb A1-C >13; CAD with previous MI; COPD/ OSA; HTN/ HLD; GERD; and morbid obesity.HIPAA/ identity verified with patient during home visittoday; patient is home alone for today's visit; pleasant 40 minute home visit, with [place]; visit was planned as warm-hand off to [place], as patient has reported that it is important to her to know her ongoing care providers.  Subjective: "I'm hoping I can stay on track with eating right with the Holidays coming up."  Assessment:[name] continuesrecuperating well after her hospitalization earlier this year, and has managed her insulin independently, accurately and appropriately.  [name] continues monitoring and recording her blood sugar levels at home twice daily, and these values have shown excellent improvement with a significant decrease in her last A1-C level since she was hospitalized in April.  Bettyis interested in learing more about dietary strategies for management of DM; she continues to requirefrequent reinforcement of education provided due to her recent stroke that she feels has affected her ability to remember and retain information. [name] will be most successful when her overall plan of care is kept simple and straightforward.  [name] is willing to engage with [place]  Today, patient again reports that she "is doinggreat,"and she denies pain, new/ recent falls, and appears to be in no apparent distress throughout home visit today.  Patient is relaxed and at ease throughout home visit.    Patient further reports:  -- No recent provider appointments; all upcoming provider appointments reviewed with patient and already added to her home calendar; patient again reports that she has successfully/ independently arranged all recent transportation requests by using her insurance transportation benefit. Positive reinforcement provided.  -- has and is taking all medications as prescribed--continues able to independently and accurately verbalize insulin dosing  Self-health management ofrecent DM with elevated A1-C: --confirms thatshe hascontinuedmonitoring and recording blood sugars "2 or 3 times/ day;"reminded patient to always take her recorded blood sugar log from home to PCP office visits, and she verbalizes understanding and agreement. -- recorded home blood sugars reviewed from time of last [place] home visit: sugars have ranged from 99-147 fasting and from 133-230 post-prandial, with only isolated high readings > 200 post-prandial. -- summarized with patient/ [name] patient's A1-C trends over last 6 months -- patient and [place] began discussion around dietary strategies in setting of DM; patient was encouraged to fully engage with [place]  Patient denies further issues, concerns, or problems today. We provided/confirmed that patient [place] phone number, the main [facility] CM office phone number, and the [facility] CM 24-hour nurse advice phone number should issues arise prior to next scheduled [facility] CM outreach  Objective:    BP 130/64   Pulse 66   Resp 18   SpO2 96%   Review of Systems  Constitutional: Negative.   Respiratory: Negative.  Negative for shortness of breath.   Cardiovascular: Negative.  Negative for leg swelling.  Gastrointestinal: Negative.   Genitourinary: Negative.  Musculoskeletal: Negative.  Negative for falls.  Neurological: Negative.    Psychiatric/Behavioral: Negative.  Negative for depression. The patient is not nervous/anxious.    Physical Exam  Constitutional: She is oriented to person, place, and time. She appears well-developed and well-nourished. No distress.  Cardiovascular: Normal rate.  Respiratory: Effort normal and breath sounds normal. No respiratory distress.  Musculoskeletal: She exhibits no edema.  Neurological: She is alert and oriented to person, place, and time.  Skin: Skin is warm and dry.  Psychiatric: She has a normal mood and affect. Her behavior is normal. Judgment and thought content normal.   Encounter Medications:   Outpatient Encounter Medications as of 02/07/2018  Medication Sig Note  . amLODipine (NORVASC) 5 MG tablet Take 1 tablet (5 mg total) by mouth daily.   Marland Kitchen atorvastatin (LIPITOR) 40 MG tablet Take 1 tablet (40 mg total) by mouth daily.   . blood glucose meter kit and supplies KIT Dispense based on patient and insurance preference. Use up to four times daily as directed. (FOR ICD-9 250.00, 250.01).   . Cholecalciferol (VITAMIN D) 2000 units CAPS Take 1 capsule by mouth daily. 10/31/2017: Plans to reorder with [employer] OTC catalog for her Q3 benefit  . clopidogrel (PLAVIX) 75 MG tablet TAKE 1 TABLET BY MOUTH EVERY DAY   . clotrimazole (LOTRIMIN AF) 1 % cream Apply 1 application topically 2 (two) times daily. Rash under breast 01/15/2018: Has not needed recently  . Colchicine (MITIGARE) 0.6 MG CAPS Take 1 tablet by mouth daily as needed (for gout). 01/15/2018: Has not needed recently  . glucose blood (ONE TOUCH ULTRA TEST) test strip USE UP TO 4 TIMES DAILY AS DIRECTED Dx Code E11.8 E11.65   . hydrALAZINE (APRESOLINE) 50 MG tablet Take 1 tablet (50 mg total) by mouth 2 (two) times daily.   . Insulin Glargine (LANTUS SOLOSTAR) 100 UNIT/ML Solostar Pen Inject 18 Units into the skin at bedtime. Qs 3 mo   . Insulin Pen Needle (B-D UF III MINI PEN NEEDLES) 31G X 5 MM MISC Inject 1 each as  directed at bedtime. Use as directed to inject insulin.  Dx code: E11.8, E11.65   . KLOR-CON 10 10 MEQ tablet TAKE 1 TABLET (10 MEQ TOTAL) BY MOUTH EVERY MONDAY, WEDNESDAY, AND FRIDAY.   Marland Kitchen losartan-hydrochlorothiazide (HYZAAR) 100-12.5 MG tablet Take 1 tablet by mouth daily.   . nitroGLYCERIN (NITROSTAT) 0.4 MG SL tablet Place 0.4 mg under the tongue every 5 (five) minutes as needed for chest pain. 01/15/2018: Has not needed recently   . ONETOUCH DELICA LANCETS FINE MISC USE U PTO 4 TIMES DAILY AS DIRECTED    No facility-administered encounter medications on file as of 02/07/2018.    Functional Status:   In your present state of health, do you have any difficulty performing the following activities: 10/17/2017 08/12/2017  Hearing? N N  Vision? Y N  Comment "Some times, only occasionally" per patient report today -  Difficulty concentrating or making decisions? N Y  Walking or climbing stairs? N Y  Dressing or bathing? N Y  Doing errands, shopping? Y Y  Comment Does not drive, per patient report today-- family assists -  Conservation officer, nature and eating ? N -  Using the Toilet? N -  In the past six months, have you accidently leaked urine? N -  Do you have problems with loss of bowel control? N -  Managing your Medications? N -  Managing your Finances? N -  Housekeeping or managing your Housekeeping?  N -  Comment family assists per patient report -  Some recent data might be hidden   Fall/Depression Screening:    Fall Risk  02/07/2018 01/15/2018 12/27/2017  Falls in the past year? (No Data) (No Data) (No Data)  Comment Denies new/ recent falls Denies new/ recent falls Patient denies new/ recent falls  Number falls in past yr: - - -  Comment - - -  Injury with Fall? - - -  Risk Factor Category  - - -  Risk for fall due to : - - -  Risk for fall due to: Comment - - -  Follow up - Falls prevention discussed -   PHQ 2/9 Scores 01/15/2018 10/17/2017 09/28/2017 08/31/2017 08/09/2016 03/10/2015  12/11/2013  PHQ - 2 Score 0 0 1 0 0 0 0   Plan:  I will place referral to [place] and make patient's PCP aware of same  Patient will maintain engagement with [place] Coach  [facility] CM Care Plan Problem One     Most Recent Value  Care Plan Problem One  Self-health management deficits around chronic disease state of DM, as evidenced by patient reporting of same and ongoing elevated A1-C levels  Role Documenting the Problem One  Care Management Coordinator  Care Plan for Problem One Not Active  [facility] Long Term Goal   Over the next 24 days, patient will verbalize 3 self-health management strategies for chronic disease state of DM with elevated A1-C levels, as evidenced by patient reporting of same during [place] outreach  [place] Term Goal Start Date  01/15/18  [facility] Long Term Goal Met Date  02/08/18 [facility] Met]  Interventions for Problem One Long Term Goal  Using teach back method, discussed and reiterated with patient previously provided education around self management of DM,  provided positive reinforcement for the progress patient has clearly made in increasing self-health management of DM,  reviewed upcoming provider appointments with patient,  reviewed and summarized education provided around A1-C,  confirmed that patient is taking insulin accurately and is continuing to monitor and record daily blood sugars  [facility] CM Short Term Goal #2   Over the next 24 days, patient will begin reviewing printed EMMI educational material around dietary meal planning in setting of DM, as evidenced by patient reporting during [facility] RN CCM outreach  [facility] CM Short Term Goal #2 Start Date  01/15/18  [facility] CM Short Term Goal #2 Met Date  02/07/18 [Goal Met]  Interventions for Short Term Goal #2  Warm hand-off/ referral to [facility] RN health Coach,  completed home visit with [place],  facilitated initiation of discussion around dietary strategies for management of DM,  encouraged patient to fully  engage with [place] Coach    [facility] CM Care Plan Problem Two     Most Recent Value  Care Plan Problem Two  Need for ongoing educaton around dietary strategies for self-health management of chronic disease state of DM, as evidenced by patient reporting  Role Documenting the Problem Two  Care Management Coordinator  Care Plan for Problem Two  Active  Interventions for Problem Two Long Term Goal   Discussed role of [place] with patient,  completed warm hand-off home visit with [place],  strongly encouraged patient to maintain her engagement with [place] Term Goal  Over the next 40 days, patient will telephonically engage with [facility]  RN Health coach for ongoing education around dietary strategies for self-health management of DM, as evidenced by successful outreach to patient by [place] Term Goal Start Date  02/07/18     It has been a pleasure caring for Darden Dates, RN, BSN, [place] Coordinator [facility] Care Management  [phone]

## 2018-02-08 ENCOUNTER — Encounter: Payer: Self-pay | Admitting: *Deleted

## 2018-02-20 ENCOUNTER — Emergency Department (HOSPITAL_COMMUNITY)
Admission: EM | Admit: 2018-02-20 | Discharge: 2018-02-20 | Disposition: A | Discharge status: Home / Self Care | Payer: Medicare Other | Attending: Emergency Medicine | Admitting: Emergency Medicine

## 2018-02-20 ENCOUNTER — Encounter (HOSPITAL_COMMUNITY): Payer: Self-pay | Admitting: *Deleted

## 2018-02-20 ENCOUNTER — Emergency Department (HOSPITAL_COMMUNITY): Payer: Medicare Other

## 2018-02-20 ENCOUNTER — Telehealth: Payer: Self-pay

## 2018-02-20 DIAGNOSIS — Z794 Long term (current) use of insulin: Secondary | ICD-10-CM | POA: Diagnosis not present

## 2018-02-20 DIAGNOSIS — Z7901 Long term (current) use of anticoagulants: Secondary | ICD-10-CM | POA: Diagnosis not present

## 2018-02-20 DIAGNOSIS — K115 Sialolithiasis: Secondary | ICD-10-CM | POA: Diagnosis not present

## 2018-02-20 DIAGNOSIS — Z8673 Personal history of transient ischemic attack (TIA), and cerebral infarction without residual deficits: Secondary | ICD-10-CM | POA: Diagnosis not present

## 2018-02-20 DIAGNOSIS — E119 Type 2 diabetes mellitus without complications: Secondary | ICD-10-CM | POA: Diagnosis not present

## 2018-02-20 DIAGNOSIS — K1120 Sialoadenitis, unspecified: Secondary | ICD-10-CM

## 2018-02-20 DIAGNOSIS — I10 Essential (primary) hypertension: Secondary | ICD-10-CM | POA: Insufficient documentation

## 2018-02-20 DIAGNOSIS — R0602 Shortness of breath: Secondary | ICD-10-CM | POA: Diagnosis not present

## 2018-02-20 DIAGNOSIS — Z79899 Other long term (current) drug therapy: Secondary | ICD-10-CM | POA: Diagnosis not present

## 2018-02-20 DIAGNOSIS — M542 Cervicalgia: Secondary | ICD-10-CM | POA: Diagnosis present

## 2018-02-20 DIAGNOSIS — I2510 Atherosclerotic heart disease of native coronary artery without angina pectoris: Secondary | ICD-10-CM | POA: Insufficient documentation

## 2018-02-20 DIAGNOSIS — I252 Old myocardial infarction: Secondary | ICD-10-CM | POA: Diagnosis not present

## 2018-02-20 DIAGNOSIS — Z96653 Presence of artificial knee joint, bilateral: Secondary | ICD-10-CM | POA: Diagnosis not present

## 2018-02-20 DIAGNOSIS — K1121 Acute sialoadenitis: Secondary | ICD-10-CM | POA: Diagnosis not present

## 2018-02-20 LAB — I-STAT TROPONIN, ED: TROPONIN I, POC: 0.01 ng/mL (ref 0.00–0.08)

## 2018-02-20 LAB — CBC
HCT: 35.5 % — ABNORMAL LOW (ref 36.0–46.0)
HEMOGLOBIN: 10.9 g/dL — AB (ref 12.0–15.0)
MCH: 27 pg (ref 26.0–34.0)
MCHC: 30.7 g/dL (ref 30.0–36.0)
MCV: 87.9 fL (ref 80.0–100.0)
Platelets: 284 10*3/uL (ref 150–400)
RBC: 4.04 MIL/uL (ref 3.87–5.11)
RDW: 14 % (ref 11.5–15.5)
WBC: 9.5 10*3/uL (ref 4.0–10.5)
nRBC: 0 % (ref 0.0–0.2)

## 2018-02-20 LAB — BASIC METABOLIC PANEL
ANION GAP: 10 (ref 5–15)
BUN: 16 mg/dL (ref 8–23)
CHLORIDE: 102 mmol/L (ref 98–111)
CO2: 27 mmol/L (ref 22–32)
Calcium: 9.4 mg/dL (ref 8.9–10.3)
Creatinine, Ser: 0.93 mg/dL (ref 0.44–1.00)
GFR calc Af Amer: >60 mL/min (ref >60)
GFR, EST NON AFRICAN AMERICAN: 58 mL/min — AB (ref >60)
GLUCOSE: 138 mg/dL — AB (ref 70–99)
POTASSIUM: 3.1 mmol/L — AB (ref 3.5–5.1)
Sodium: 139 mmol/L (ref 135–145)

## 2018-02-20 MED ORDER — CLINDAMYCIN HCL 150 MG PO CAPS: 150.0000 mg | ORAL_CAPSULE | Freq: Four times a day (QID) | ORAL | 0 refills | Status: DC

## 2018-02-20 MED ORDER — IOHEXOL 300 MG/ML  SOLN
100.0000 mL | Freq: Once | INTRAMUSCULAR | Status: AC | PRN
  Administered 2018-02-20: 75 mL via INTRAVENOUS

## 2018-02-20 MED ORDER — CLINDAMYCIN HCL 150 MG PO CAPS: 300.0000 mg | ORAL_CAPSULE | Freq: Two times a day (BID) | ORAL | 0 refills | Status: DC

## 2018-02-20 MED ORDER — IBUPROFEN 400 MG PO TABS: 400.0000 mg | ORAL_TABLET | Freq: Four times a day (QID) | ORAL | 0 refills | Status: DC | PRN

## 2018-02-20 NOTE — ED Notes (Signed)
Patient transported to CT 

## 2018-02-20 NOTE — Discharge Instructions (Addendum)
Ms. Gatti,   We found that you had an infection of the saliva glands in your mouth as well as a stone in 1 of the saliva ducts (right submandibular sialolith and acute sialoadenitis)  I will discharge you with pain medications and antibiotics.  He will take clindamycin 300 mg twice a day for 7 days.   I will also advise that he follow-up with the ENT doctor if your symptoms does not improve.   Please drink a lot of fluids.

## 2018-02-20 NOTE — Telephone Encounter (Signed)
Pt started with facial swelling last night one hour after drinking some [place] tea pt bought. pts son having same symptoms. Pt did not rest well last night. Pt also having swelling in neck and throat this morning. Pt cannot eat but can swallow liquids; but swallowing is difficult. Pt said she is nervous with the reaction pt is having to tea and that might be affecting her breathing slightly. Pt has not taken any new meds, no new foods, no new laundry detergent, soaps or shampoos. Pt declines calling 911; pt said her son will take her to [facility] ED now. FYI to Dr [name].

## 2018-02-20 NOTE — ED Provider Notes (Signed)
[facility] EMERGENCY DEPARTMENT Provider Note   CSN: [id number] Arrival date & time: 02/20/18  1007     History   Chief Complaint Chief Complaint  Patient presents with  . Shortness of Breath    HPI Kristen [name] is a 77 y.o. female with medical history significant for hypertension, DVT, hyperlipidemia, obesity, type 2 diabetes mellitus, coronary artery disease presenting for evaluation for right neck pain.  Patient reports that 2 days ago she began experiencing facial flushing and swelling of her right neck after she drank [place] tea.  She also reports of subjective fevers, chills and possible shortness of breath but denies any sick contacts, rhinorrhea, lacrimation, nasal congestion, cough, sore throat, ear pain, pruritus.  Patient also reports that her son experienced similar symptoms of facial swelling and right neck pain and swelling right after drinking the tea as well.   Past Medical History:  Diagnosis Date  . Abdominal aortic atherosclerosis ([place]) by CT 02/2014  . Acute kidney injury ([place]) 02/29/2016   During hospitalization 07/2017  . CAD (coronary artery disease)    by CT, per pt h/o MI  . Diabetes type 2, uncontrolled ([place])   . Frequent headaches   . GERD (gastroesophageal reflux disease)   . Gout   . History of pulmonary embolism 2012  . HLD (hyperlipidemia)   . HTN (hypertension)   . 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 Morbid obesity ([place])   . Myocardial infarction [facility]) 2012   per pt. report, states she was treated with medicine, here at [facility]    . Osteoarthritis    knees  . Primary localized osteoarthritis of left knee 09/22/2014  . Risk for falls 09/28/2017  . Sleep apnea 2011   study done in [place], states that since she lost weight she doesn't use the CPAP any longer & she doesn't have a problem with sleep apnea  . Stroke [facility])    still has  balance problem on occas. , uses cane but that's mainly for the left knee pain  . Syncope 03/01/2016  . Thoracic aortic atherosclerosis ([place]) 12/2015   by CXR  . Vertigo    hx. benign postitional postural    Patient Active Problem List   Diagnosis Date Noted  . Polypharmacy 02/04/2018  . Urinary urgency 10/29/2017  . Seizure ([place]) 09/11/2017  . Aphasia   . Thyroid function test abnormal 03/12/2017  . Disturbance of skin sensation 09/21/2016  . Hemiparesis of right dominant side ([place]) 09/02/2016  . Intertrigo 08/16/2016  . Vitamin D deficiency 08/08/2016  . Gout 03/24/2016  . Syncope 02/29/2016  . Concussion 02/29/2016  . Arthritis of left wrist 02/07/2016  . Hemoptysis 12/31/2015  . Thoracic aortic atherosclerosis ([place]) 12/24/2015  . History of CVA (cerebrovascular accident) 11/03/2015  . Abdominal aortic atherosclerosis ([place])   . Anemia 06/18/2015  . Sensorineural hearing loss (SNHL) of both ears 03/29/2015  . Axillary mass 03/10/2015  . Pain of right middle finger 12/22/2014  . Memory deficit 12/04/2014  . Primary osteoarthritis of left knee 01/27/2014  . Advanced care planning/counseling discussion 12/11/2013  . Medicare annual wellness visit, subsequent 12/11/2013  . Health maintenance examination 12/11/2013  . CAD (coronary artery disease)/coronary calcifications   . Chest pain 07/17/2013  . Benign paroxysmal positional vertigo 05/26/2013  . HLD (hyperlipidemia)   . Obesity, Class II, BMI 35-39.9, with comorbidity ([place])   . History  of pulmonary embolism   . Osteoarthritis   . Diabetes mellitus type 2, uncontrolled, with complications ([place])   . History of DVT (deep vein thrombosis) 12/21/2010  . Essential hypertension 05/13/2009    Past Surgical History:  Procedure Laterality Date  . CATARACT EXTRACTION Bilateral 2013  . EYE SURGERY     /w IOL  . FOOT SURGERY Right   . PARTIAL HYSTERECTOMY     for fibroids, ovaries remain  . TONSILLECTOMY    . TOTAL KNEE  ARTHROPLASTY Right 1990s  . TOTAL KNEE ARTHROPLASTY Left 09/22/2014   [name], MD  . TUBAL LIGATION       OB History   None      Home Medications    Prior to Admission medications   Medication Sig Start Date End Date Taking? Authorizing Provider  amLODipine (NORVASC) 5 MG tablet Take 1 tablet (5 mg total) by mouth daily. 10/29/17  Yes [name], MD  atorvastatin (LIPITOR) 40 MG tablet Take 1 tablet (40 mg total) by mouth daily. 01/01/18  Yes [name], MD  Cholecalciferol (VITAMIN D) 2000 units CAPS Take 1 capsule by mouth daily.   Yes [provider]  clopidogrel (PLAVIX) 75 MG tablet TAKE 1 TABLET BY MOUTH EVERY DAY Patient taking differently: Take 75 mg by mouth daily.  01/01/18  Yes [name], MD  clotrimazole (LOTRIMIN AF) 1 % cream Apply 1 application topically 2 (two) times daily. Rash under breast Patient taking differently: Apply 1 application topically as needed. Rash under breast 08/16/16  Yes [name], MD  Colchicine (MITIGARE) 0.6 MG CAPS Take 1 tablet by mouth daily as needed (for gout). 10/29/17  Yes [name], MD  hydrALAZINE (APRESOLINE) 50 MG tablet Take 1 tablet (50 mg total) by mouth 2 (two) times daily. 10/29/17  Yes [name], MD  Insulin Glargine (LANTUS SOLOSTAR) 100 UNIT/ML Solostar Pen Inject 18 Units into the skin at bedtime. Qs 3 mo Patient taking differently: Inject 18 Units into the skin at bedtime.  12/12/17  Yes [name], MD  KLOR-CON 10 10 MEQ tablet TAKE 1 TABLET (10 MEQ TOTAL) BY MOUTH EVERY MONDAY, [place], AND FRIDAY. Patient taking differently: Take 10 mEq by mouth every Monday, Wednesday, and Friday.  08/23/17  Yes [name], MD  losartan-hydrochlorothiazide [facility]) 100-12.5 MG tablet Take 1 tablet by mouth daily. 10/29/17  Yes [name], MD  metoprolol tartrate (LOPRESSOR) 25 MG tablet Take 12.5 mg by mouth 2 (two) times daily. 01/23/18  Yes [provider]    blood glucose meter kit and supplies KIT Dispense based on patient and insurance preference. Use up to four times daily as directed. (FOR ICD-9 250.00, 250.01). 08/31/17   Elby Beck, FNP  glucose blood (ONE TOUCH ULTRA TEST) test strip USE UP TO 4 TIMES DAILY AS DIRECTED Dx Code E11.8 E11.65 01/16/18   [name], MD  Insulin Pen Needle (B-D UF III MINI PEN NEEDLES) 31G X 5 MM MISC Inject 1 each as directed at bedtime. Use as directed to inject insulin.  Dx code: E11.8, E11.65    [provider]  nitroGLYCERIN (NITROSTAT) 0.4 MG SL tablet Place 0.4 mg under the tongue every 5 (five) minutes as needed for chest pain.    [provider]  [facility] DELICA LANCETS FINE MISC USE U PTO 4 TIMES DAILY AS DIRECTED 10/01/17   [name], MD    Family History Family History  Problem Relation Age of Onset  . Cancer Mother  bone  . Diabetes Father   . Hypertension Father   . Cancer Son 31       lung  . Congenital heart disease Son 76  . Stroke Brother     Social History Social History   Tobacco Use  . Smoking status: Never Smoker  . Smokeless tobacco: Never Used  . Tobacco comment: tobacco use- no   Substance Use Topics  . Alcohol use: Yes    Alcohol/week: 2.0 standard drinks    Types: 1 Glasses of wine, 1 Cans of beer per week    Comment: Rare  . Drug use: No     Allergies   Acetaminophen; Aleve [naproxen sodium]; Doxycycline; Metformin and related; and Penicillins   Review of Systems Review of Systems  Constitutional: Positive for chills and fever.  HENT: Negative for congestion, ear pain, facial swelling, rhinorrhea, sinus pressure and trouble swallowing.        Right neck swelling and pain  Eyes: Negative.   Respiratory: Negative.   Cardiovascular: Negative.   Gastrointestinal: Negative.   Endocrine: Negative.   Musculoskeletal: Negative.   Skin: Rash: facial flushing   Neurological: Negative.   Psychiatric/Behavioral: Negative.       Physical Exam Updated Vital Signs BP (!) 170/80 (BP Location: Right Arm)   Pulse 68   Temp 98 F (36.7 C) (Oral)   Resp 20   SpO2 100%   Physical Exam  Constitutional: She appears well-developed and well-nourished.  Non-toxic appearance. She does not appear ill.  HENT:  Head: Normocephalic and atraumatic.  Mouth/Throat: Oropharynx is clear and moist. No oropharyngeal exudate or posterior oropharyngeal edema.  Neck: No thyroid mass and no thyromegaly present.    Swollen and tender lymphadenopathy at the right cervical anterior chain  Pulmonary/Chest: Effort normal and breath sounds normal. She has no decreased breath sounds. She has no wheezes. She has no rhonchi. She has no rales.  Abdominal: Soft. Bowel sounds are normal.  Neurological: She is alert.  Skin: Skin is warm. Rash: facial flushing.  Psychiatric: She has a normal mood and affect. Her behavior is normal.     ED Treatments / Results  Labs (all labs ordered are listed, but only abnormal results are displayed) Labs Reviewed  BASIC METABOLIC PANEL - Abnormal; Notable for the following components:      Result Value   Potassium 3.1 (*)    Glucose, Bld 138 (*)    GFR calc non Af Amer 58 (*)    All other components within normal limits  CBC - Abnormal; Notable for the following components:   Hemoglobin 10.9 (*)    HCT 35.5 (*)    All other components within normal limits  I-STAT TROPONIN, ED    EKG EKG Interpretation  Date/Time:  Wednesday February 20 2018 10:09:40 EDT Ventricular Rate:  68 PR Interval:  170 QRS Duration: 88 QT Interval:  376 QTC Calculation: 399 R Axis:   -24 Text Interpretation:  Normal sinus rhythm Moderate voltage criteria for LVH, may be normal variant ST-t wave abnormality Abnormal ekg Confirmed by Carmin Muskrat [phone]) on 02/20/2018 11:53:22 AM   Radiology Dg Chest 2 View  Result Date: 02/20/2018 CLINICAL DATA:  Shortness of breath. EXAM: CHEST - 2 VIEW COMPARISON:   08/12/2017. FINDINGS: Normal sized heart. Clear lungs. Minimal peribronchial thickening. Tortuous and mildly calcified thoracic aorta. Thoracic spine degenerative changes. IMPRESSION: Minimal bronchitic changes. Electronically Signed   By: Claudie Revering M.D.   On: 02/20/2018 11:02   Ct Soft  Tissue Neck W Contrast  Result Date: 02/20/2018 CLINICAL DATA:  RIGHT neck pain and difficulty swallowing for 1 day. Assess neck mass. History of diabetes, tonsillectomy. EXAM: CT NECK WITH CONTRAST TECHNIQUE: Multidetector CT imaging of the neck was performed using the standard protocol following the bolus administration of intravenous contrast. CONTRAST:  49m OMNIPAQUE IOHEXOL 300 MG/ML  SOLN COMPARISON:  MRI head August 13, 2017 FINDINGS: PHARYNX AND LARYNX: RIGHT hypopharyngeal edema effacing the vallecula, with thickened RIGHT glossoepiglottic fold. Mildly edematous RIGHT paraglottic fat. RIGHT parapharyngeal fat stranding. Mildly edematous RIGHT mylohyoid muscle. SALIVARY GLANDS: 2 mm RIGHT submandibular sialolith. Mildly enlarged edematous RIGHT submandibular gland with trace free fluid RIGHT submental, RIGHT submandibular, RIGHT parotid space with fat stranding. No ductal dilatation or mass. THYROID: Normal. LYMPH NODES: No lymphadenopathy by CT size criteria. VASCULAR: Moderate calcific atherosclerosis carotid bifurcations. Mild calcific atherosclerosis aortic arch. LIMITED INTRACRANIAL: Moderate calcific atherosclerosis carotid siphon. VISUALIZED ORBITS: Status post bilateral ocular lens implants. MASTOIDS AND VISUALIZED PARANASAL SINUSES: Trace sphenoid ethmoidal mucosal thickening. Mastoid air cells are well aerated. SKELETON: Nonacute. Patient is edentulous. Posterior arch of C1 is developmentally unfused. Congenital canal narrowing of cervical spine resulting severe canal stenosis C3-4, moderate at C4-5 and C5-6. Severe canal stenosis C5-6 due to disc osteophyte complex. UPPER CHEST: Lung apices are clear. No  superior mediastinal lymphadenopathy. OTHER: RIGHT lower face soft tissue swelling with subcutaneous fat stranding and thickened platysma. Trace retropharyngeal effusion. IMPRESSION: 1. RIGHT submandibular sialolith and acute sialoadenitis. RIGHT transspatial neck edema. Patent airway. 2. Degenerative change of the spine resulting in severe canal stenosis C3-4 and T5-6. Electronically Signed   By: CElon AlasM.D.   On: 02/20/2018 13:39    Procedures Procedures (including critical care time)  Medications Ordered in ED Medications  iohexol (OMNIPAQUE) 300 MG/ML solution 100 mL (75 mLs Intravenous Contrast Given 02/20/18 1314)     Initial Impression / Assessment and Plan / ED Course  I have reviewed the triage vital signs and the nursing notes.  Pertinent labs & imaging results that were available during my care of the patient were reviewed by me and considered in my medical decision making (see chart for details).   77year old woman presented with 2-day history of right neck pain and swelling associated with subjective fevers and chills.  On arrival patient remained afebrile, physical exam with noticeable tenderness and swelling of the right mandible, CBC with no leukocytosis.  CT soft tissue neck revealed right submandibular sialolith and acute sialoadenitis.  Patient will be discharged with NSAIDs for pain control and a 7-day course of clindamycin 300 mg twice daily and also will be referred to ENT for follow-up as needed.   Final Clinical Impressions(s) / ED Diagnoses   Final diagnoses:  Sialolith  Sialoadenitis of submandibular gland    ED Discharge Orders    None       AJean Rosenthal MD 02/20/18 1413    LCarmin Muskrat MD 02/21/18 [phone]

## 2018-02-20 NOTE — ED Triage Notes (Signed)
Pt in c/o shortness of breath that started last night, also facial swelling to her right side of her face, symptoms are worse when she lays down at night, no distress noted

## 2018-02-23 ENCOUNTER — Other Ambulatory Visit: Payer: Self-pay | Admitting: Family Medicine

## 2018-03-06 ENCOUNTER — Other Ambulatory Visit: Payer: Self-pay | Admitting: *Deleted

## 2018-03-06 NOTE — Patient Outreach (Signed)
[facility]) Care Management  03/06/2018   Kristen [name] 14-Jan-1941 [id number]  RN Health Coach telephone call to patient.  [employer] compliance verified. Per patient she is doing much better. Patient voiced concerns about past emergency visit. Patient stated the swelling in face is gone down and the soreness when swallowing is much better. Patient stated the soreness in her mouth was so bad that she could only drink certain things. Per patient her blood sugar was all over the place but is now under control. Fasting blood sugar was 119. Patient has agreed to follow up education and outreach calls.    Current Medications:  Current Outpatient Medications  Medication Sig Dispense Refill  . amLODipine (NORVASC) 5 MG tablet Take 1 tablet (5 mg total) by mouth daily. 90 tablet 1  . atorvastatin (LIPITOR) 40 MG tablet Take 1 tablet (40 mg total) by mouth daily. 90 tablet 3  . blood glucose meter kit and supplies KIT Dispense based on patient and insurance preference. Use up to four times daily as directed. (FOR ICD-9 250.00, 250.01). 1 each 0  . Cholecalciferol (VITAMIN D) 2000 units CAPS Take 1 capsule by mouth daily.    . clindamycin (CLEOCIN) 150 MG capsule Take 2 capsules (300 mg total) by mouth 2 (two) times daily. 28 capsule 0  . clopidogrel (PLAVIX) 75 MG tablet TAKE 1 TABLET BY MOUTH EVERY DAY (Patient taking differently: Take 75 mg by mouth daily. ) 90 tablet 3  . clotrimazole (LOTRIMIN AF) 1 % cream Apply 1 application topically 2 (two) times daily. Rash under breast (Patient taking differently: Apply 1 application topically as needed. Rash under breast) 45 g 1  . Colchicine (MITIGARE) 0.6 MG CAPS Take 1 tablet by mouth daily as needed (for gout). 30 capsule 3  . glucose blood (ONE TOUCH ULTRA TEST) test strip USE UP TO 4 TIMES DAILY AS DIRECTED Dx Code E11.8 E11.65 100 each 11  . hydrALAZINE (APRESOLINE) 50 MG tablet Take 1 tablet (50 mg total) by mouth 2 (two) times daily.  180 tablet 1  . ibuprofen (ADVIL,MOTRIN) 400 MG tablet Take 1 tablet (400 mg total) by mouth every 6 (six) hours as needed. 30 tablet 0  . Insulin Glargine (LANTUS SOLOSTAR) 100 UNIT/ML Solostar Pen Inject 18 Units into the skin at bedtime. Qs 3 mo (Patient taking differently: Inject 18 Units into the skin at bedtime. ) 6 pen 3  . Insulin Pen Needle (B-D UF III MINI PEN NEEDLES) 31G X 5 MM MISC Inject 1 each as directed at bedtime. Use as directed to inject insulin.  Dx code: E11.8, E11.65    . losartan-hydrochlorothiazide (HYZAAR) 100-12.5 MG tablet Take 1 tablet by mouth daily. 90 tablet 1  . metoprolol tartrate (LOPRESSOR) 25 MG tablet Take 12.5 mg by mouth 2 (two) times daily.  3  . nitroGLYCERIN (NITROSTAT) 0.4 MG SL tablet Place 0.4 mg under the tongue every 5 (five) minutes as needed for chest pain.    [name] DELICA LANCETS FINE MISC USE U PTO 4 TIMES DAILY AS DIRECTED 100 each 2  . potassium chloride (K-DUR) 10 MEQ tablet TAKE 1 TABLET (10 MEQ TOTAL) BY MOUTH EVERY MONDAY, WEDNESDAY, AND FRIDAY. NEED APPT FOR REFILLS 30 tablet 0   No current facility-administered medications for this visit.     Functional Status:  In your present state of health, do you have any difficulty performing the following activities: 03/06/2018 10/17/2017  Hearing? N N  Vision? Y Y  Comment - "  Some times, only occasionally" per patient report today  Difficulty concentrating or making decisions? N N  Walking or climbing stairs? N N  Dressing or bathing? N N  Doing errands, shopping? Y Y  Comment - Does not drive, per patient report today-- family assists  Preparing Food and eating ? N N  Using the Toilet? N N  In the past six months, have you accidently leaked urine? N N  Do you have problems with loss of bowel control? N N  Managing your Medications? N N  Managing your Finances? N N  Housekeeping or managing your Housekeeping? Y N  Comment - family assists per patient report  Some recent data might  be hidden    Fall/Depression Screening: Fall Risk  03/06/2018 02/07/2018 01/15/2018  Falls in the past year? 1 (No Data) (No Data)  Comment - Denies new/ recent falls Denies new/ recent falls  Number falls in past yr: 1 - -  Comment - - -  Injury with Fall? 0 - -  Risk Factor Category  - - -  Risk for fall due to : History of fall(s);Impaired balance/gait;Impaired mobility - -  Risk for fall due to: Comment - - -  Follow up Falls evaluation completed;Falls prevention discussed - Falls prevention discussed   PHQ 2/9 Scores 03/06/2018 01/15/2018 10/17/2017 09/28/2017 08/31/2017 08/09/2016 03/10/2015  PHQ - 2 Score 0 0 0 1 0 0 0   THN CM Care Plan Problem One     Most Recent Value  Care Plan Problem One  knowledge Deficit in self management of Diabetes  Role Documenting the Problem One  [place] for Problem One  Active  THN Long Term Goal   Patient will verbalize understanding how the fasting blood sugar affects the A1C within the next 90 days  THN Long Term Goal Start Date  03/06/18  Interventions for Problem One Long Term Goal  RN discussed what the fasting blood sugar needs to be 80-130 for the A1C to be less than 7. Patient has living well with diabetes booklet. RN will follow up for further discussions teach back.  THN CM Short Term Goal #1   Patient will be able to verbalize healthy snack choices within the next 30 days  THN CM Short Term Goal #1 Start Date  03/06/18  Interventions for Short Term Goal #1  RN discussed making healthy snack choices. RN sent a list of snacks for patient to partake in between meals. RN will follow up with further discussion and teach back  THN CM Short Term Goal #2   Patient will be able to verbalize foods to choose of of dine out menus within the next 30 days  THN CM Short Term Goal #2 Start Date  03/06/18  Interventions for Short Term Goal #2  RN discussed making better choices when going to drive through restaurants. RN sent a list of  diabetic dining out menu. RN will follow up with further discussion and teach back.      Assessment:  Fasting blood sugar is 119 Patient will benefit from Health Coach telephonic outreach for education and support for diabetes self management.  Plan:  RN sent dining out menu for diabetics RN sent healthy snack list Patient has PCP appointment [phone] RN will follow up within the month of November after Dr visit as patient requested.  [facility] [phone]

## 2018-03-12 ENCOUNTER — Other Ambulatory Visit: Payer: Self-pay | Admitting: Family Medicine

## 2018-03-12 ENCOUNTER — Ambulatory Visit (INDEPENDENT_AMBULATORY_CARE_PROVIDER_SITE_OTHER): Payer: Medicare Other

## 2018-03-12 VITALS — BP 122/76 | HR 75 | Temp 98.6°F | Ht 62.0 in | Wt 198.5 lb

## 2018-03-12 DIAGNOSIS — Z0000 Encounter for general adult medical examination without abnormal findings: Secondary | ICD-10-CM

## 2018-03-12 DIAGNOSIS — E785 Hyperlipidemia, unspecified: Secondary | ICD-10-CM

## 2018-03-12 DIAGNOSIS — E559 Vitamin D deficiency, unspecified: Secondary | ICD-10-CM

## 2018-03-12 DIAGNOSIS — M1A09X0 Idiopathic chronic gout, multiple sites, without tophus (tophi): Secondary | ICD-10-CM | POA: Diagnosis not present

## 2018-03-12 DIAGNOSIS — E118 Type 2 diabetes mellitus with unspecified complications: Secondary | ICD-10-CM

## 2018-03-12 DIAGNOSIS — R946 Abnormal results of thyroid function studies: Secondary | ICD-10-CM | POA: Diagnosis not present

## 2018-03-12 DIAGNOSIS — IMO0002 Reserved for concepts with insufficient information to code with codable children: Secondary | ICD-10-CM

## 2018-03-12 DIAGNOSIS — E1165 Type 2 diabetes mellitus with hyperglycemia: Secondary | ICD-10-CM | POA: Diagnosis not present

## 2018-03-12 LAB — COMPREHENSIVE METABOLIC PANEL
ALBUMIN: 3.7 g/dL (ref 3.5–5.2)
ALT: 22 U/L (ref 0–35)
AST: 19 U/L (ref 0–37)
Alkaline Phosphatase: 111 U/L (ref 39–117)
BILIRUBIN TOTAL: 0.8 mg/dL (ref 0.2–1.2)
BUN: 27 mg/dL — ABNORMAL HIGH (ref 6–23)
CALCIUM: 9.7 mg/dL (ref 8.4–10.5)
CO2: 31 mEq/L (ref 19–32)
CREATININE: 1.43 mg/dL — AB (ref 0.40–1.20)
Chloride: 100 mEq/L (ref 96–112)
GFR: 45.81 mL/min — ABNORMAL LOW (ref >60.00)
Glucose, Bld: 135 mg/dL — ABNORMAL HIGH (ref 70–99)
Potassium: 3.8 mEq/L (ref 3.5–5.1)
Sodium: 140 mEq/L (ref 135–145)
TOTAL PROTEIN: 7.1 g/dL (ref 6.0–8.3)

## 2018-03-12 LAB — VITAMIN D 25 HYDROXY (VIT D DEFICIENCY, FRACTURES): VITD: 27.3 ng/mL — ABNORMAL LOW (ref 30.00–100.00)

## 2018-03-12 LAB — LIPID PANEL
CHOL/HDL RATIO: 3
CHOLESTEROL: 128 mg/dL (ref 0–200)
HDL: 39.3 mg/dL (ref >39.00)
LDL Cholesterol: 74 mg/dL (ref 0–99)
NonHDL: 89.02
TRIGLYCERIDES: 76 mg/dL (ref 0.0–149.0)
VLDL: 15.2 mg/dL (ref 0.0–40.0)

## 2018-03-12 LAB — URIC ACID: Uric Acid, Serum: 8.1 mg/dL — ABNORMAL HIGH (ref 2.4–7.0)

## 2018-03-12 LAB — T4, FREE: Free T4: 0.89 ng/dL (ref 0.60–1.60)

## 2018-03-12 LAB — TSH: TSH: 4.85 u[IU]/mL — ABNORMAL HIGH (ref 0.35–4.50)

## 2018-03-12 LAB — HEMOGLOBIN A1C: Hgb A1c MFr Bld: 7.9 % — ABNORMAL HIGH (ref 4.6–6.5)

## 2018-03-12 NOTE — Progress Notes (Signed)
PCP notes:   Health maintenance:  Flu vaccine - pt declined A1C - completed Eye exam - addressed  Abnormal screenings:   Hearing - failed  Hearing Screening   125Hz  250Hz  500Hz  1000Hz  2000Hz  3000Hz  4000Hz  6000Hz  8000Hz   Right ear:   40 0 40  0    Left ear:   40 0 40  0     Mini-Cog score: 19/20 MMSE - Mini Mental State Exam 03/12/2018 08/09/2016  Orientation to time 5 5  Orientation to Place 5 5  Registration 3 3  Attention/ Calculation 0 0  Recall 3 3  Language- name 2 objects 0 0  Language- repeat 1 1  Language- follow 3 step command 2 3  Language- follow 3 step command-comments unable to follow 1 step of 3 step command -  Language- read & follow direction 0 0  Write a sentence 0 0  Copy design 0 0  Total score 19 20    Patient concerns:   None  Nurse concerns:  None  Next PCP appt:   11/125/19 @ 0930

## 2018-03-12 NOTE — Progress Notes (Signed)
Subjective:   Kristen [name] is a 77 y.o. female who presents for Medicare Annual (Subsequent) preventive examination.  Review of Systems:  N/A Cardiac Risk Factors include: advanced age (>52mn, >>79women);diabetes mellitus;dyslipidemia;hypertension;obesity (BMI >30kg/m2)     Objective:     Vitals: BP 122/76 (BP Location: Right Arm, Patient Position: Sitting, Cuff Size: Large)   Pulse 75   Temp 98.6 F (37 C) (Oral)   Ht '5\' 2"'  (1.575 m) Comment: shoes  Wt 198 lb 8 oz (90 kg)   SpO2 98%   BMI 36.31 kg/m   Body mass index is 36.31 kg/m.  Advanced Directives 03/12/2018 10/31/2017 10/17/2017 09/28/2017 08/12/2017 09/02/2016 09/02/2016  Does Patient Have a Medical Advance Directive? Yes Yes No No Yes No No  Type of [place] (pink MOST or yellow form);Living will - - Living will - -  Does patient want to make changes to medical advance directive? - No - Patient declined - - No - Patient declined - -  Copy of [place] Chart? No - copy requested - - - - - -  Would patient like information on creating a medical advance directive? - - Yes (MAU/Ambulatory/Procedural Areas - Information given) Yes (MAU/Ambulatory/Procedural Areas - Information given) - No - Patient declined -    Tobacco Social History   Tobacco Use  Smoking Status Never Smoker  Smokeless Tobacco Never Used  Tobacco Comment   tobacco use- no      Counseling given: No Comment: tobacco use- no    Clinical Intake:  Pre-visit preparation completed: Yes  Pain : No/denies pain Pain Score: 0-No pain     Nutritional Status: BMI > 30  Obese Nutritional Risks: None Diabetes: Yes CBG done?: No Did pt. bring in CBG monitor from home?: No  How often do you need to have someone help you when you read instructions, pamphlets, or other written materials from your doctor or pharmacy?: 1 - Never What is  the last grade level you completed in school?: 10th grade  Interpreter Needed?: No  Comments: pt is a widow and lives alone Information entered by :: LPinson, LPN  Past Medical History:  Diagnosis Date  . [place]  . Acute kidney injury ([place] 02/29/2016   During hospitalization 07/2017  . CAD (coronary artery disease)    by CT, per pt h/o MI  . Diabetes type 2, uncontrolled ([place]   . Frequent headaches   . GERD (gastroesophageal reflux disease)   . Gout   . History of pulmonary embolism 2012  . HLD (hyperlipidemia)   . HTN (hypertension)   . 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 Morbid obesity ([place]   . Myocardial infarction ([facility] 2012   per pt. report, [facility]   . Osteoarthritis    knees  . Primary localized osteoarthritis of left knee 09/22/2014  . Risk for falls 09/28/2017  . [place] apnea 2011   [place] that since she [place] she doesn't use the CPAP any longer & she doesn't have a problem with sleep apnea  . Stroke ([facility]    still has balance problem on occas. , uses cane but that's mainly for the left knee pain  . Syncope 03/01/2016  .  Thoracic aortic atherosclerosis ([place]) 12/2015   by CXR  . Vertigo    hx. benign postitional postural   Past Surgical History:  Procedure Laterality Date  . CATARACT EXTRACTION Bilateral 2013  . EYE SURGERY     /w IOL  . FOOT SURGERY Right   . PARTIAL HYSTERECTOMY     for fibroids, ovaries remain  . TONSILLECTOMY    . TOTAL KNEE ARTHROPLASTY Right 1990s  . TOTAL KNEE ARTHROPLASTY Left 09/22/2014   [name], MD  . TUBAL LIGATION     Family History  Problem Relation Age of Onset  . Cancer Mother        bone  . Diabetes Father   . Hypertension Father   . Cancer Son 31       lung  . Congenital heart disease Son 45  . Stroke  Brother    Social History   Socioeconomic History  . Marital status: Widowed    Spouse name: Not on file  . Number of children: Not on file  . Years of education: Not on file  . Highest education level: Not on file  Occupational History  . Not on file  Social Needs  . Financial resource strain: Not very hard  . Food insecurity:    Worry: Never true    Inability: Never true  . Transportation needs:    Medical: No    Non-medical: No  Tobacco Use  . Smoking status: Never Smoker  . Smokeless tobacco: Never Used  . Tobacco comment: tobacco use- no   Substance and Sexual Activity  . Alcohol use: Yes    Alcohol/week: 2.0 standard drinks    Types: 1 Glasses of wine, 1 Cans of beer per week    Comment: Rare  . Drug use: No  . Sexual activity: Not on file  Lifestyle  . Physical activity:    Days per week: Not on file    Minutes per session: Not on file  . Stress: Not on file  Relationships  . Social connections:    Talks on phone: Not on file    Gets together: Not on file    Attends religious service: Not on file    Active member of club or organization: Not on file    Attends meetings of clubs or organizations: Not on file    Relationship status: Not on file  Other Topics Concern  . Not on file  Social History Narrative   Lives alone   4 grown children   Education: 11th grade   Occupation: retired, was Scientist, water quality   Activity: Does not regularly exercise.    Diet: good water, fruits/vegetables seldom   Transportation: Patient has been educated on her transportation benefit through [facility]    Outpatient Encounter Medications as of 03/12/2018  Medication Sig  . amLODipine (NORVASC) 5 MG tablet Take 1 tablet (5 mg total) by mouth daily.  Marland Kitchen atorvastatin (LIPITOR) 40 MG tablet Take 1 tablet (40 mg total) by mouth daily.  . blood glucose meter kit and supplies KIT Dispense based on patient and insurance preference. Use up to four times daily as directed. (FOR ICD-9  250.00, 250.01).  . Cholecalciferol (VITAMIN D) 2000 units CAPS Take 1 capsule by mouth daily.  . clindamycin (CLEOCIN) 150 MG capsule Take 2 capsules (300 mg total) by mouth 2 (two) times daily.  . clopidogrel (PLAVIX) 75 MG tablet TAKE 1 TABLET BY MOUTH EVERY DAY (Patient taking differently: Take 75 mg by mouth daily. )  .  clotrimazole (LOTRIMIN AF) 1 % cream Apply 1 application topically 2 (two) times daily. Rash under breast (Patient taking differently: Apply 1 application topically as needed. Rash under breast)  . Colchicine (MITIGARE) 0.6 MG CAPS Take 1 tablet by mouth daily as needed (for gout).  Marland Kitchen glucose blood (ONE TOUCH ULTRA TEST) test strip USE UP TO 4 TIMES DAILY AS DIRECTED Dx Code E11.8 E11.65  . hydrALAZINE (APRESOLINE) 50 MG tablet Take 1 tablet (50 mg total) by mouth 2 (two) times daily.  Marland Kitchen ibuprofen (ADVIL,MOTRIN) 400 MG tablet Take 1 tablet (400 mg total) by mouth every 6 (six) hours as needed.  . Insulin Glargine (LANTUS SOLOSTAR) 100 UNIT/ML Solostar Pen Inject 18 Units into the skin at bedtime. Qs 3 mo (Patient taking differently: Inject 18 Units into the skin at bedtime. )  . Insulin Pen Needle (B-D UF III MINI PEN NEEDLES) 31G X 5 MM MISC Inject 1 each as directed at bedtime. Use as directed to inject insulin.  Dx code: E11.8, E11.65  . losartan-hydrochlorothiazide (HYZAAR) 100-12.5 MG tablet Take 1 tablet by mouth daily.  . metoprolol tartrate (LOPRESSOR) 25 MG tablet Take 12.5 mg by mouth 2 (two) times daily.  . nitroGLYCERIN (NITROSTAT) 0.4 MG SL tablet Place 0.4 mg under the tongue every 5 (five) minutes as needed for chest pain.  [name] DELICA LANCETS FINE MISC USE U PTO 4 TIMES DAILY AS DIRECTED  . potassium chloride (K-DUR) 10 MEQ tablet TAKE 1 TABLET (10 MEQ TOTAL) BY MOUTH EVERY MONDAY, WEDNESDAY, AND FRIDAY. NEED APPT FOR REFILLS   No facility-administered encounter medications on file as of 03/12/2018.     Activities of Daily Living In your present state  of health, do you have any difficulty performing the following activities: 03/12/2018 03/06/2018  Hearing? N N  Vision? N Y  Comment - -  Difficulty concentrating or making decisions? N N  Walking or climbing stairs? N N  Dressing or bathing? N N  Doing errands, shopping? N Y  Comment - -  Conservation officer, nature and eating ? N N  Using the Toilet? N N  In the past six months, have you accidently leaked urine? N N  Do you have problems with loss of bowel control? N N  Managing your Medications? N N  Managing your Finances? N N  Housekeeping or managing your Housekeeping? N Y  Comment - -  Some recent data might be hidden    Patient Care Team: [name], MD as PCP - General (Family Medicine) Rockey Situ, Kathlene November, MD as Consulting Physician (Cardiology) Pleasant, Eppie Gibson, RN as [facility]    Assessment:   This is a routine wellness examination for [employer].   Hearing Screening   '125Hz'  '250Hz'  '500Hz'  '1000Hz'  '2000Hz'  '3000Hz'  '4000Hz'  '6000Hz'  '8000Hz'   Right ear:   40 0 40  0    Left ear:   40 0 40  0      Visual Acuity Screening   Right eye Left eye Both eyes  Without correction: 20/30-1 20/50 20/40-1  With correction:       Exercise Activities and Dietary recommendations Current Exercise Habits: The patient does not participate in regular exercise at present, Exercise limited by: None identified  Goals    . chronic health management     Starting 03/12/2018, I will continue to take medications as prescribed to manage health conditions.       Fall Risk Fall Risk  03/12/2018 03/06/2018 02/07/2018 01/15/2018 12/27/2017  Falls in the past year? 1 1 (No Data) (No Data) (No Data)  Comment - - Denies new/ recent falls Denies new/ recent falls Patient denies new/ recent falls  Number falls in past yr: 1 1 - - -  Comment - - - - -  Injury with Fall? 0 0 - - -  Risk Factor Category  - - - - -  Risk for fall due to : History of fall(s);Impaired balance/gait  History of fall(s);Impaired balance/gait;Impaired mobility - - -  Risk for fall due to: Comment - - - - -  Follow up - Falls evaluation completed;Falls prevention discussed - Falls prevention discussed -   Depression Screen PHQ 2/9 Scores 03/12/2018 03/06/2018 01/15/2018 10/17/2017  PHQ - 2 Score 0 0 0 0  PHQ- 9 Score 0 - - -     Cognitive Function MMSE - Mini Mental State Exam 03/12/2018 08/09/2016  Orientation to time 5 5  Orientation to Place 5 5  Registration 3 3  Attention/ Calculation 0 0  Recall 3 3  Language- name 2 objects 0 0  Language- repeat 1 1  Language- follow 3 step command 2 3  Language- follow 3 step command-comments unable to follow 1 step of 3 step command -  Language- read & follow direction 0 0  Write a sentence 0 0  Copy design 0 0  Total score 19 20     PLEASE NOTE: A Mini-Cog screen was completed. Maximum score is 20. A value of 0 denotes this part of Folstein MMSE was not completed or the patient failed this part of the Mini-Cog screening.   Mini-Cog Screening Orientation to Time - Max 5 pts Orientation to Place - Max 5 pts Registration - Max 3 pts Recall - Max 3 pts Language Repeat - Max 1 pts Language Follow 3 Step Command - Max 3 pts     Immunization History  Administered Date(s) Administered  . PPD Test 09/25/2014  . Pneumococcal Conjugate-13 09/10/2013  . Pneumococcal Polysaccharide-23 03/10/2015  . Zoster 04/21/2014    Screening Tests Health Maintenance  Topic Date Due  . INFLUENZA VACCINE  07/24/2018 (Originally 11/22/2017)  . OPHTHALMOLOGY EXAM  04/24/2019 (Originally 12/11/2014)  . DTaP/Tdap/Td (1 - Tdap) 08/10/2026 (Originally 08/07/1959)  . TETANUS/TDAP  08/10/2026 (Originally 08/07/1959)  . HEMOGLOBIN A1C  09/10/2018  . FOOT EXAM  09/28/2018  . DEXA SCAN  Completed  . PNA vac Low Risk Adult  Completed     Plan:     I have personally reviewed, addressed, and noted the following in the patient's chart:  A. Medical and social  history B. Use of alcohol, tobacco or illicit drugs  C. Current medications and supplements D. Functional ability and status E.  Nutritional status F.  Physical activity G. Advance directives H. List of other physicians I.  Hospitalizations, surgeries, and ER visits in previous 12 months J.  [place] to include hearing, vision, cognitive, depression L. Referrals and appointments - none  In addition, I have reviewed and discussed with patient certain preventive protocols, quality metrics, and best practice recommendations. A written personalized care plan for preventive services as well as general preventive health recommendations were provided to patient.  See attached scanned questionnaire for additional information.   Signed,   Lindell Noe, MHA, BS, LPN Health Coach

## 2018-03-12 NOTE — Patient Instructions (Signed)
Ms. Glasner , Thank you for taking time to come for your Medicare Wellness Visit. I appreciate your ongoing commitment to your health goals. Please review the following plan we discussed and let me know if I can assist you in the future.   These are the goals we discussed: Goals    . chronic health management     Starting 03/12/2018, I will continue to take medications as prescribed to manage health conditions.       This is a list of the screening recommended for you and due dates:  Health Maintenance  Topic Date Due  . Flu Shot  07/24/2018*  . Eye exam for diabetics  04/24/2019*  . DTaP/Tdap/Td vaccine (1 - Tdap) 08/10/2026*  . Tetanus Vaccine  08/10/2026*  . Hemoglobin A1C  09/10/2018  . Complete foot exam   09/28/2018  . DEXA scan (bone density measurement)  Completed  . Pneumonia vaccines  Completed  *Topic was postponed. The date shown is not the original due date.   Preventive Care for Adults  A healthy lifestyle and preventive care can promote health and wellness. Preventive health guidelines for adults include the following key practices.  . A routine yearly physical is a good way to check with your health care provider about your health and preventive screening. It is a chance to share any concerns and updates on your health and to receive a thorough exam.  . Visit your dentist for a routine exam and preventive care every 6 months. Brush your teeth twice a day and floss once a day. Good oral hygiene prevents tooth decay and gum disease.  . The frequency of eye exams is based on your age, health, family medical history, use  of contact lenses, and other factors. Follow your health care provider's recommendations for frequency of eye exams.  . Eat a healthy diet. Foods like vegetables, fruits, whole grains, low-fat dairy products, and lean protein foods contain the nutrients you need without too many calories. Decrease your intake of foods high in solid fats, added sugars,  and salt. Eat the right amount of calories for you. Get information about a proper diet from your health care provider, if necessary.  . Regular physical exercise is one of the most important things you can do for your health. Most adults should get at least 150 minutes of moderate-intensity exercise (any activity that increases your heart rate and causes you to sweat) each week. In addition, most adults need muscle-strengthening exercises on 2 or more days a week.  Silver Sneakers may be a benefit available to you. To determine eligibility, you may visit the website: www.silversneakers.com or contact program at 956-649-7932 Mon-Fri between 8AM-8PM.   . Maintain a healthy weight. The body mass index (BMI) is a screening tool to identify possible weight problems. It provides an estimate of body fat based on height and weight. Your health care provider can find your BMI and can help you achieve or maintain a healthy weight.   For adults 20 years and older: ? A BMI below 18.5 is considered underweight. ? A BMI of 18.5 to 24.9 is normal. ? A BMI of 25 to 29.9 is considered overweight. ? A BMI of 30 and above is considered obese.   . Maintain normal blood lipids and cholesterol levels by exercising and minimizing your intake of saturated fat. Eat a balanced diet with plenty of fruit and vegetables. Blood tests for lipids and cholesterol should begin at age 39 and be repeated every  5 years. If your lipid or cholesterol levels are high, you are over 50, or you are at high risk for heart disease, you may need your cholesterol levels checked more frequently. Ongoing high lipid and cholesterol levels should be treated with medicines if diet and exercise are not working.  . If you smoke, find out from your health care provider how to quit. If you do not use tobacco, please do not start.  . If you choose to drink alcohol, please do not consume more than 2 drinks per day. One drink is considered to be 12  ounces (355 mL) of beer, 5 ounces (148 mL) of wine, or 1.5 ounces (44 mL) of liquor.  . If you are 81-60 years old, ask your health care provider if you should take aspirin to prevent strokes.  . Use sunscreen. Apply sunscreen liberally and repeatedly throughout the day. You should seek shade when your shadow is shorter than you. Protect yourself by wearing long sleeves, pants, a wide-brimmed hat, and sunglasses year round, whenever you are outdoors.  . Once a month, do a whole body skin exam, using a mirror to look at the skin on your back. Tell your health care provider of new moles, moles that have irregular borders, moles that are larger than a pencil eraser, or moles that have changed in shape or color.

## 2018-03-18 ENCOUNTER — Ambulatory Visit (INDEPENDENT_AMBULATORY_CARE_PROVIDER_SITE_OTHER): Payer: Medicare Other | Admitting: Family Medicine

## 2018-03-18 ENCOUNTER — Encounter: Payer: Self-pay | Admitting: Family Medicine

## 2018-03-18 VITALS — BP 134/78 | HR 59 | Temp 97.8°F | Ht 62.0 in | Wt 200.8 lb

## 2018-03-18 DIAGNOSIS — M1A09X0 Idiopathic chronic gout, multiple sites, without tophus (tophi): Secondary | ICD-10-CM

## 2018-03-18 DIAGNOSIS — Z1211 Encounter for screening for malignant neoplasm of colon: Secondary | ICD-10-CM

## 2018-03-18 DIAGNOSIS — E118 Type 2 diabetes mellitus with unspecified complications: Secondary | ICD-10-CM

## 2018-03-18 DIAGNOSIS — Z7189 Other specified counseling: Secondary | ICD-10-CM

## 2018-03-18 DIAGNOSIS — E785 Hyperlipidemia, unspecified: Secondary | ICD-10-CM

## 2018-03-18 DIAGNOSIS — Z8673 Personal history of transient ischemic attack (TIA), and cerebral infarction without residual deficits: Secondary | ICD-10-CM

## 2018-03-18 DIAGNOSIS — IMO0002 Reserved for concepts with insufficient information to code with codable children: Secondary | ICD-10-CM

## 2018-03-18 DIAGNOSIS — E559 Vitamin D deficiency, unspecified: Secondary | ICD-10-CM

## 2018-03-18 DIAGNOSIS — Z0000 Encounter for general adult medical examination without abnormal findings: Secondary | ICD-10-CM | POA: Diagnosis not present

## 2018-03-18 DIAGNOSIS — I10 Essential (primary) hypertension: Secondary | ICD-10-CM | POA: Diagnosis not present

## 2018-03-18 DIAGNOSIS — N289 Disorder of kidney and ureter, unspecified: Secondary | ICD-10-CM | POA: Insufficient documentation

## 2018-03-18 DIAGNOSIS — I700 Atherosclerosis of aorta: Secondary | ICD-10-CM

## 2018-03-18 DIAGNOSIS — I2510 Atherosclerotic heart disease of native coronary artery without angina pectoris: Secondary | ICD-10-CM

## 2018-03-18 DIAGNOSIS — R946 Abnormal results of thyroid function studies: Secondary | ICD-10-CM

## 2018-03-18 DIAGNOSIS — E1165 Type 2 diabetes mellitus with hyperglycemia: Secondary | ICD-10-CM

## 2018-03-18 MED ORDER — INSULIN PEN NEEDLE 31G X 5 MM MISC: 1.0000 | Freq: Every day | 1 refills | Status: DC

## 2018-03-18 NOTE — Assessment & Plan Note (Signed)
Continue plavix, statin.  

## 2018-03-18 NOTE — Assessment & Plan Note (Signed)
Chronic, improving. [place] pharmacy care. Pt happy with noted improvements. Motivated to continue towards good diabetes control

## 2018-03-18 NOTE — Assessment & Plan Note (Signed)
Advanced directive: brings in today and will be scanned - sons Jaquelyn Bitter and [name] are [place]. Does not want prolonged life support if terminal condition. MOST form reviewed and filled out today as well.

## 2018-03-18 NOTE — Assessment & Plan Note (Signed)
Urate elevated, denies recurrent flares. Will continue to monitor for now.

## 2018-03-18 NOTE — Assessment & Plan Note (Signed)
Continue 2000 IU daily.  

## 2018-03-18 NOTE — Assessment & Plan Note (Signed)
Encouraged healthy diet and lifestyle changes to affect sustainable weight loss. Obesity contributes to comorbidies including DM, HTN, HLD etc

## 2018-03-18 NOTE — Patient Instructions (Addendum)
Pass by lab to pick up stool kit. Call the breast center to schedule mammogram early next year.  Schedule eye exam early next year.  We will watch kidneys - avoid ibuprofen and increase water intake by 1-2 glasses a day.  Sugars were doing better! Keep up the good work. Return in 3 months for follow up visit.  Health Maintenance, Female Adopting a healthy lifestyle and getting preventive care can go a long way to promote health and wellness. Talk with your health care provider about what schedule of regular examinations is right for you. This is a good chance for you to check in with your provider about disease prevention and staying healthy. In between checkups, there are plenty of things you can do on your own. Experts have done a lot of research about which lifestyle changes and preventive measures are most likely to keep you healthy. Ask your health care provider for more information. Weight and diet Eat a healthy diet  Be sure to include plenty of vegetables, fruits, low-fat dairy products, and lean protein.  Do not eat a lot of foods high in solid fats, added sugars, or salt.  Get regular exercise. This is one of the most important things you can do for your health. ? Most adults should exercise for at least 150 minutes each week. The exercise should increase your heart rate and make you sweat (moderate-intensity exercise). ? Most adults should also do strengthening exercises at least twice a week. This is in addition to the moderate-intensity exercise.  Maintain a healthy weight  Body mass index (BMI) is a measurement that can be used to identify possible weight problems. It estimates body fat based on height and weight. Your health care provider can help determine your BMI and help you achieve or maintain a healthy weight.  For females 76 years of age and older: ? A BMI below 18.5 is considered underweight. ? A BMI of 18.5 to 24.9 is normal. ? A BMI of 25 to 29.9 is considered  overweight. ? A BMI of 30 and above is considered obese.  Watch levels of cholesterol and blood lipids  You should start having your blood tested for lipids and cholesterol at 76 years of age, then have this test every 5 years.  You may need to have your cholesterol levels checked more often if: ? Your lipid or cholesterol levels are high. ? You are older than 76 years of age. ? You are at high risk for heart disease.  Cancer screening Lung Cancer  Lung cancer screening is recommended for adults 76-53 years old who are at high risk for lung cancer because of a history of smoking.  A yearly low-dose CT scan of the lungs is recommended for people who: ? Currently smoke. ? Have quit within the past 15 years. ? Have at least a 30-pack-year history of smoking. A pack year is smoking an average of one pack of cigarettes a day for 1 year.  Yearly screening should continue until it has been 15 years since you quit.  Yearly screening should stop if you develop a health problem that would prevent you from having lung cancer treatment.  Breast Cancer  Practice breast self-awareness. This means understanding how your breasts normally appear and feel.  It also means doing regular breast self-exams. Let your health care provider know about any changes, no matter how small.  If you are in your 20s or 30s, you should have a clinical breast exam (CBE) by  a health care provider every 1-3 years as part of a regular health exam.  If you are 76 or older, have a CBE every year. Also consider having a breast X-ray (mammogram) every year.  If you have a family history of breast cancer, talk to your health care provider about genetic screening.  If you are at high risk for breast cancer, talk to your health care provider about having an MRI and a mammogram every year.  Breast cancer gene (BRCA) assessment is recommended for women who have family members with BRCA-related cancers. BRCA-related cancers  include: ? Breast. ? Ovarian. ? Tubal. ? Peritoneal cancers.  Results of the assessment will determine the need for genetic counseling and BRCA1 and BRCA2 testing.  Cervical Cancer Your health care provider may recommend that you be screened regularly for cancer of the pelvic organs (ovaries, uterus, and vagina). This screening involves a pelvic examination, including checking for microscopic changes to the surface of your cervix (Pap test). You may be encouraged to have this screening done every 3 years, beginning at age 76.  For women ages 67-65, health care providers may recommend pelvic exams and Pap testing every 3 years, or they may recommend the Pap and pelvic exam, combined with testing for human papilloma virus (HPV), every 5 years. Some types of HPV increase your risk of cervical cancer. Testing for HPV may also be done on women of any age with unclear Pap test results.  Other health care providers may not recommend any screening for nonpregnant women who are considered low risk for pelvic cancer and who do not have symptoms. Ask your health care provider if a screening pelvic exam is right for you.  If you have had past treatment for cervical cancer or a condition that could lead to cancer, you need Pap tests and screening for cancer for at least 20 years after your treatment. If Pap tests have been discontinued, your risk factors (such as having a new sexual partner) need to be reassessed to determine if screening should resume. Some women have medical problems that increase the chance of getting cervical cancer. In these cases, your health care provider may recommend more frequent screening and Pap tests.  Colorectal Cancer  This type of cancer can be detected and often prevented.  Routine colorectal cancer screening usually begins at 76 years of age and continues through 76 years of age.  Your health care provider may recommend screening at an earlier age if you have risk factors  for colon cancer.  Your health care provider may also recommend using home test kits to check for hidden blood in the stool.  A small camera at the end of a tube can be used to examine your colon directly (sigmoidoscopy or colonoscopy). This is done to check for the earliest forms of colorectal cancer.  Routine screening usually begins at age 76.  Direct examination of the colon should be repeated every 5-10 years through 76 years of age. However, you may need to be screened more often if early forms of precancerous polyps or small growths are found.  Skin Cancer  Check your skin from head to toe regularly.  Tell your health care provider about any new moles or changes in moles, especially if there is a change in a mole's shape or color.  Also tell your health care provider if you have a mole that is larger than the size of a pencil eraser.  Always use sunscreen. Apply sunscreen liberally and repeatedly  throughout the day.  Protect yourself by wearing long sleeves, pants, a wide-brimmed hat, and sunglasses whenever you are outside.  Heart disease, diabetes, and high blood pressure  High blood pressure causes heart disease and increases the risk of stroke. High blood pressure is more likely to develop in: ? People who have blood pressure in the high end of the normal range (130-139/85-89 mm Hg). ? People who are overweight or obese. ? People who are African American.  If you are 82-37 years of age, have your blood pressure checked every 3-5 years. If you are 52 years of age or older, have your blood pressure checked every year. You should have your blood pressure measured twice-once when you are at a hospital or clinic, and once when you are not at a hospital or clinic. Record the average of the two measurements. To check your blood pressure when you are not at a hospital or clinic, you can use: ? An automated blood pressure machine at a pharmacy. ? A home blood pressure monitor.  If  you are between 25 years and 33 years old, ask your health care provider if you should take aspirin to prevent strokes.  Have regular diabetes screenings. This involves taking a blood sample to check your fasting blood sugar level. ? If you are at a normal weight and have a low risk for diabetes, have this test once every three years after 77 years of age. ? If you are overweight and have a high risk for diabetes, consider being tested at a younger age or more often. Preventing infection Hepatitis B  If you have a higher risk for hepatitis B, you should be screened for this virus. You are considered at high risk for hepatitis B if: ? You were born in a country where hepatitis B is common. Ask your health care provider which countries are considered high risk. ? Your parents were born in a high-risk country, and you have not been immunized against hepatitis B (hepatitis B vaccine). ? You have HIV or AIDS. ? You use needles to inject street drugs. ? You live with someone who has hepatitis B. ? You have had sex with someone who has hepatitis B. ? You get hemodialysis treatment. ? You take certain medicines for conditions, including cancer, organ transplantation, and autoimmune conditions.  Hepatitis C  Blood testing is recommended for: ? Everyone born from [birth year] through 1964. ? Anyone with known risk factors for hepatitis C.  Sexually transmitted infections (STIs)  You should be screened for sexually transmitted infections (STIs) including gonorrhea and chlamydia if: ? You are sexually active and are younger than 76 years of age. ? You are older than 76 years of age and your health care provider tells you that you are at risk for this type of infection. ? Your sexual activity has changed since you were last screened and you are at an increased risk for chlamydia or gonorrhea. Ask your health care provider if you are at risk.  If you do not have HIV, but are at risk, it may be recommended  that you take a prescription medicine daily to prevent HIV infection. This is called pre-exposure prophylaxis (PrEP). You are considered at risk if: ? You are sexually active and do not regularly use condoms or know the HIV status of your partner(s). ? You take drugs by injection. ? You are sexually active with a partner who has HIV.  Talk with your health care provider about whether you are  at high risk of being infected with HIV. If you choose to begin PrEP, you should first be tested for HIV. You should then be tested every 3 months for as long as you are taking PrEP. Pregnancy  If you are premenopausal and you may become pregnant, ask your health care provider about preconception counseling.  If you may become pregnant, take 400 to 800 micrograms (mcg) of folic acid every day.  If you want to prevent pregnancy, talk to your health care provider about birth control (contraception). Osteoporosis and menopause  Osteoporosis is a disease in which the bones lose minerals and strength with aging. This can result in serious bone fractures. Your risk for osteoporosis can be identified using a bone density scan.  If you are 30 years of age or older, or if you are at risk for osteoporosis and fractures, ask your health care provider if you should be screened.  Ask your health care provider whether you should take a calcium or vitamin D supplement to lower your risk for osteoporosis.  Menopause may have certain physical symptoms and risks.  Hormone replacement therapy may reduce some of these symptoms and risks. Talk to your health care provider about whether hormone replacement therapy is right for you. Follow these instructions at home:  Schedule regular health, dental, and eye exams.  Stay current with your immunizations.  Do not use any tobacco products including cigarettes, chewing tobacco, or electronic cigarettes.  If you are pregnant, do not drink alcohol.  If you are  breastfeeding, limit how much and how often you drink alcohol.  Limit alcohol intake to no more than 1 drink per day for nonpregnant women. One drink equals 12 ounces of beer, 5 ounces of wine, or 1 ounces of hard liquor.  Do not use street drugs.  Do not share needles.  Ask your health care provider for help if you need support or information about quitting drugs.  Tell your health care provider if you often feel depressed.  Tell your health care provider if you have ever been abused or do not feel safe at home. This information is not intended to replace advice given to you by your health care provider. Make sure you discuss any questions you have with your health care provider. Document Released: 10/24/2010 Document Revised: 09/16/2015 Document Reviewed: 01/12/2015 Elsevier Interactive Patient Education  [employer].

## 2018-03-18 NOTE — Assessment & Plan Note (Signed)
Chronic, stable. Continue atorvastatin. The ASCVD Risk score Mikey Bussing DC Jr., et al., 2013) failed to calculate for the following reasons:   The patient has a prior MI or stroke diagnosis

## 2018-03-18 NOTE — Assessment & Plan Note (Signed)
Preventative protocols reviewed and updated unless pt declined. Discussed healthy diet and lifestyle.  

## 2018-03-18 NOTE — Progress Notes (Signed)
BP 134/78 (BP Location: Left Arm, Patient Position: Sitting, Cuff Size: Large)   Pulse (!) 59   Temp 97.8 F (36.6 C) (Oral)   Ht '5\' 2"'  (1.575 m)   Wt 200 lb 12 oz (91.1 kg)   SpO2 98%   BMI 36.72 kg/m    CC: CPE Subjective:    Patient ID: Kristen [name], female    DOB: Feb 15, 1941, 77 y.o.   MRN: [id number]  HPI: Kristen [name] is a 77 y.o. female presenting on 03/18/2018 for Annual Exam (Pt 2.)   Saw Katha Cabal last week for medicare wellness visit. Note reviewed.    Preventative: S/p colonoscopy "a while ago" with Dr. Brunetta Genera. No records received. Denies blood in stool. No fmhx colon cancer. Did not turn in last few stool tests as she got sick last year - provided with another kit today.  Well woman - s/p hysterectomy for fibroids, ovaries remain. Discussed GYN cancers. Denies symptoms.  Mammogram - Birads1 07/2016.  DEXA WNL 03/2015 Flu shot - declines  prevnar 08/2013, pneumovax 2016  Zostavax - 03/2014 Advanced directive: brings in today and will be scanned - sons Jaquelyn Bitter and [name] are [place]. Does not want prolonged life support if terminal condition. MOST form reviewed and filled out 10/2017 as well.  Seat belt use discussed Sunscreen use discussed. No changing moles on skin.  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   Lives alone  4 grown children  Education: 11th grade Occupation: retired, was Scientist, water quality  Activity: no regular walking.  Diet: good water, fruits/vegetables seldom.   Relevant past medical, surgical, family and social history reviewed and updated as indicated. Interim medical history since our last visit reviewed. Allergies and medications reviewed and updated. Outpatient Medications Prior to Visit  Medication Sig Dispense Refill  . amLODipine (NORVASC) 5 MG tablet Take 1 tablet (5 mg total) by mouth daily. 90 tablet 1  . atorvastatin (LIPITOR) 40 MG tablet Take 1 tablet (40 mg total) by mouth  daily. 90 tablet 3  . blood glucose meter kit and supplies KIT Dispense based on patient and insurance preference. Use up to four times daily as directed. (FOR ICD-9 250.00, 250.01). 1 each 0  . Cholecalciferol (VITAMIN D) 2000 units CAPS Take 1 capsule by mouth daily.    . clindamycin (CLEOCIN) 150 MG capsule Take 2 capsules (300 mg total) by mouth 2 (two) times daily. 28 capsule 0  . clopidogrel (PLAVIX) 75 MG tablet TAKE 1 TABLET BY MOUTH EVERY DAY (Patient taking differently: Take 75 mg by mouth daily. ) 90 tablet 3  . clotrimazole (LOTRIMIN AF) 1 % cream Apply 1 application topically 2 (two) times daily. Rash under breast (Patient taking differently: Apply 1 application topically as needed. Rash under breast) 45 g 1  . Colchicine (MITIGARE) 0.6 MG CAPS Take 1 tablet by mouth daily as needed (for gout). 30 capsule 3  . glucose blood (ONE TOUCH ULTRA TEST) test strip USE UP TO 4 TIMES DAILY AS DIRECTED Dx Code E11.8 E11.65 100 each 11  . hydrALAZINE (APRESOLINE) 50 MG tablet Take 1 tablet (50 mg total) by mouth 2 (two) times daily. 180 tablet 1  . ibuprofen (ADVIL,MOTRIN) 400 MG tablet Take 1 tablet (400 mg total) by mouth every 6 (six) hours as needed. 30 tablet 0  . Insulin Glargine (LANTUS SOLOSTAR) 100 UNIT/ML Solostar Pen Inject 18 Units into the skin at bedtime. Qs 3 mo (  Patient taking differently: Inject 18 Units into the skin at bedtime. ) 6 pen 3  . losartan-hydrochlorothiazide (HYZAAR) 100-12.5 MG tablet Take 1 tablet by mouth daily. 90 tablet 1  . metoprolol tartrate (LOPRESSOR) 25 MG tablet Take 12.5 mg by mouth 2 (two) times daily.  3  . nitroGLYCERIN (NITROSTAT) 0.4 MG SL tablet Place 0.4 mg under the tongue every 5 (five) minutes as needed for chest pain.    [name] DELICA LANCETS FINE MISC USE U PTO 4 TIMES DAILY AS DIRECTED 100 each 2  . potassium chloride (K-DUR) 10 MEQ tablet TAKE 1 TABLET (10 MEQ TOTAL) BY MOUTH EVERY MONDAY, WEDNESDAY, AND FRIDAY. NEED APPT FOR REFILLS 30  tablet 0  . Insulin Pen Needle (B-D UF III MINI PEN NEEDLES) 31G X 5 MM MISC Inject 1 each as directed at bedtime. Use as directed to inject insulin.  Dx code: E11.8, E11.65     No facility-administered medications prior to visit.      Per HPI unless specifically indicated in ROS section below Review of Systems  Constitutional: Negative for activity change, appetite change, chills, fatigue, fever and unexpected weight change.  HENT: Negative for hearing loss.   Eyes: Negative for visual disturbance.  Respiratory: Negative for cough, chest tightness, shortness of breath and wheezing.   Cardiovascular: Negative for chest pain, palpitations and leg swelling.  Gastrointestinal: Negative for abdominal distention, abdominal pain, blood in stool, constipation, diarrhea, nausea and vomiting.  Genitourinary: Negative for difficulty urinating and hematuria.  Musculoskeletal: Negative for arthralgias, myalgias and neck pain.  Skin: Negative for rash.  Neurological: Negative for dizziness, seizures, syncope and headaches.  Hematological: Negative for adenopathy. Does not bruise/bleed easily.  Psychiatric/Behavioral: Negative for dysphoric mood. The patient is not nervous/anxious.        Objective:    BP 134/78 (BP Location: Left Arm, Patient Position: Sitting, Cuff Size: Large)   Pulse (!) 59   Temp 97.8 F (36.6 C) (Oral)   Ht '5\' 2"'  (1.575 m)   Wt 200 lb 12 oz (91.1 kg)   SpO2 98%   BMI 36.72 kg/m   Wt Readings from Last 3 Encounters:  03/18/18 200 lb 12 oz (91.1 kg)  03/12/18 198 lb 8 oz (90 kg)  12/11/17 200 lb 12 oz (91.1 kg)    Physical Exam  Constitutional: She is oriented to person, place, and time. She appears well-developed and well-nourished. No distress.  HENT:  Head: Normocephalic and atraumatic.  Right Ear: Hearing, tympanic membrane, external ear and ear canal normal.  Left Ear: Hearing, tympanic membrane, external ear and ear canal normal.  Nose: Nose normal.    Mouth/Throat: Uvula is midline, oropharynx is clear and moist and mucous membranes are normal. No oropharyngeal exudate, posterior oropharyngeal edema or posterior oropharyngeal erythema.  Eyes: Pupils are equal, round, and reactive to light. Conjunctivae and EOM are normal. No scleral icterus.  Neck: Normal range of motion. Neck supple.  Cardiovascular: Normal rate, regular rhythm, normal heart sounds and intact distal pulses.  No murmur heard. Pulses:      Radial pulses are 2+ on the right side, and 2+ on the left side.  Pulmonary/Chest: Effort normal and breath sounds normal. No respiratory distress. She has no wheezes. She has no rales.  Abdominal: Soft. Bowel sounds are normal. She exhibits no distension and no mass. There is no tenderness. There is no rebound and no guarding.  Musculoskeletal: Normal range of motion. She exhibits no edema.  Lymphadenopathy:  She has no cervical adenopathy.  Neurological: She is alert and oriented to person, place, and time.  CN grossly intact, station and gait intact  Skin: Skin is warm and dry. No rash noted.  Psychiatric: She has a normal mood and affect. Her behavior is normal. Judgment and thought content normal.  Nursing note and vitals reviewed.  Results for orders placed or performed in visit on 03/12/18  Hemoglobin A1c  Result Value Ref Range   Hgb A1c MFr Bld 7.9 (H) 4.6 - 6.5 %  T4, free  Result Value Ref Range   Free T4 0.89 0.60 - 1.60 ng/dL  TSH  Result Value Ref Range   TSH 4.85 (H) 0.35 - 4.50 uIU/mL  VITAMIN D 25 Hydroxy (Vit-D Deficiency, Fractures)  Result Value Ref Range   VITD 27.30 (L) 30.00 - 100.00 ng/mL  Comprehensive metabolic panel  Result Value Ref Range   Sodium 140 135 - 145 mEq/L   Potassium 3.8 3.5 - 5.1 mEq/L   Chloride 100 96 - 112 mEq/L   CO2 31 19 - 32 mEq/L   Glucose, Bld 135 (H) 70 - 99 mg/dL   BUN 27 (H) 6 - 23 mg/dL   Creatinine, Ser 1.43 (H) 0.40 - 1.20 mg/dL   Total Bilirubin 0.8 0.2 - 1.2  mg/dL   Alkaline Phosphatase 111 39 - 117 U/L   AST 19 0 - 37 U/L   ALT 22 0 - 35 U/L   Total Protein 7.1 6.0 - 8.3 g/dL   Albumin 3.7 3.5 - 5.2 g/dL   Calcium 9.7 8.4 - 10.5 mg/dL   GFR 45.81 (L) >60.00 mL/min  Lipid panel  Result Value Ref Range   Cholesterol 128 0 - 200 mg/dL   Triglycerides 76.0 0.0 - 149.0 mg/dL   HDL 39.30 >39.00 mg/dL   VLDL 15.2 0.0 - 40.0 mg/dL   LDL Cholesterol 74 0 - 99 mg/dL   Total CHOL/HDL Ratio 3    NonHDL 89.02   Uric acid  Result Value Ref Range   Uric Acid, Serum 8.1 (H) 2.4 - 7.0 mg/dL      Assessment & Plan:   Problem List Items Addressed This Visit    Vitamin D deficiency    Continue 2000 IU daily.       Thyroid function test abnormal    Subclinical hypothyroidism - will continue to monitor.       Thoracic aortic atherosclerosis (HCC)    Continue plavix, statin.      Severe obesity (BMI 35.0-39.9) with comorbidity ([place])    Encouraged healthy diet and lifestyle changes to affect sustainable weight loss. Obesity contributes to comorbidies including DM, HTN, HLD etc      Renal insufficiency    New - will monitor for now. Advised limit NSAIDs (already does this), increase water intake. Will reassess at 3 mo f/u visit.       HLD (hyperlipidemia)    Chronic, stable. Continue atorvastatin. The ASCVD Risk score Mikey Bussing DC Jr., et al., 2013) failed to calculate for the following reasons:   The patient has a prior MI or stroke diagnosis       History of CVA (cerebrovascular accident)   Health maintenance examination - Primary    Preventative protocols reviewed and updated unless pt declined. Discussed healthy diet and lifestyle.       Gout    Urate elevated, denies recurrent flares. Will continue to monitor for now.       Essential hypertension  Chronic, stable. Continue current regimen.       Diabetes mellitus type 2, uncontrolled, with complications (HCC)    Chronic, improving. [place] pharmacy care. Pt happy with  noted improvements. Motivated to continue towards good diabetes control      CAD (coronary artery disease)/coronary calcifications   Advanced care planning/counseling discussion    Advanced directive: brings in today and will be scanned - sons Jaquelyn Bitter and [name] are [facility]. Does not want prolonged life support if terminal condition. MOST form reviewed and filled out today as well.       Abdominal aortic atherosclerosis (HCC)    Continue plavix, statin.       Other Visit Diagnoses    Special screening for malignant neoplasms, colon       Relevant Orders   Fecal occult blood, imunochemical       Meds ordered this encounter  Medications  . Insulin Pen Needle (B-D UF III MINI PEN NEEDLES) 31G X 5 MM MISC    Sig: Inject 1 each as directed at bedtime. Use as directed to inject insulin.  Dx code: E11.8, E11.65    Dispense:  100 each    Refill:  1   Orders Placed This Encounter  Procedures  . Fecal occult blood, imunochemical    Standing Status:   Future    Standing Expiration Date:   03/19/2019    Follow up plan: Return in about 3 months (around 06/18/2018) for follow up visit.  [name], MD

## 2018-03-18 NOTE — Assessment & Plan Note (Signed)
Chronic, stable. Continue current regimen. 

## 2018-03-18 NOTE — Assessment & Plan Note (Signed)
New - will monitor for now. Advised limit NSAIDs (already does this), increase water intake. Will reassess at 3 mo f/u visit.

## 2018-03-18 NOTE — Assessment & Plan Note (Signed)
Subclinical hypothyroidism - will continue to monitor.

## 2018-03-19 ENCOUNTER — Other Ambulatory Visit: Payer: Self-pay | Admitting: *Deleted

## 2018-03-19 NOTE — Patient Outreach (Signed)
[facility]) Care Management  03/19/2018   Kristen [name] 28-Sep-1940 [id number]  RN Health Coach telephone call to patient.  Hipaa compliance verified. Patient is very excited about her progress. RN congratulated and encourage to keep it up. Patient A1C has decreased from 8.9  three months ago to 7.9. Patient has set a goal of seeing a decrease next blood draw. RN discussed holiday eating . Patient has agreed to follow up outreach calls.   Current Medications:  Current Outpatient Medications  Medication Sig Dispense Refill  . amLODipine (NORVASC) 5 MG tablet Take 1 tablet (5 mg total) by mouth daily. 90 tablet 1  . atorvastatin (LIPITOR) 40 MG tablet Take 1 tablet (40 mg total) by mouth daily. 90 tablet 3  . blood glucose meter kit and supplies KIT Dispense based on patient and insurance preference. Use up to four times daily as directed. (FOR ICD-9 250.00, 250.01). 1 each 0  . Cholecalciferol (VITAMIN D) 2000 units CAPS Take 1 capsule by mouth daily.    . clindamycin (CLEOCIN) 150 MG capsule Take 2 capsules (300 mg total) by mouth 2 (two) times daily. 28 capsule 0  . clopidogrel (PLAVIX) 75 MG tablet TAKE 1 TABLET BY MOUTH EVERY DAY (Patient taking differently: Take 75 mg by mouth daily. ) 90 tablet 3  . clotrimazole (LOTRIMIN AF) 1 % cream Apply 1 application topically 2 (two) times daily. Rash under breast (Patient taking differently: Apply 1 application topically as needed. Rash under breast) 45 g 1  . Colchicine (MITIGARE) 0.6 MG CAPS Take 1 tablet by mouth daily as needed (for gout). 30 capsule 3  . glucose blood (ONE TOUCH ULTRA TEST) test strip USE UP TO 4 TIMES DAILY AS DIRECTED Dx Code E11.8 E11.65 100 each 11  . hydrALAZINE (APRESOLINE) 50 MG tablet Take 1 tablet (50 mg total) by mouth 2 (two) times daily. 180 tablet 1  . ibuprofen (ADVIL,MOTRIN) 400 MG tablet Take 1 tablet (400 mg total) by mouth every 6 (six) hours as needed. 30 tablet 0  . Insulin Glargine  (LANTUS SOLOSTAR) 100 UNIT/ML Solostar Pen Inject 18 Units into the skin at bedtime. Qs 3 mo (Patient taking differently: Inject 18 Units into the skin at bedtime. ) 6 pen 3  . Insulin Pen Needle (B-D UF III MINI PEN NEEDLES) 31G X 5 MM MISC Inject 1 each as directed at bedtime. Use as directed to inject insulin.  Dx code: E11.8, E11.65 100 each 1  . losartan-hydrochlorothiazide (HYZAAR) 100-12.5 MG tablet Take 1 tablet by mouth daily. 90 tablet 1  . metoprolol tartrate (LOPRESSOR) 25 MG tablet Take 12.5 mg by mouth 2 (two) times daily.  3  . nitroGLYCERIN (NITROSTAT) 0.4 MG SL tablet Place 0.4 mg under the tongue every 5 (five) minutes as needed for chest pain.    [name] DELICA LANCETS FINE MISC USE U PTO 4 TIMES DAILY AS DIRECTED 100 each 2  . potassium chloride (K-DUR) 10 MEQ tablet TAKE 1 TABLET (10 MEQ TOTAL) BY MOUTH EVERY MONDAY, WEDNESDAY, AND FRIDAY. NEED APPT FOR REFILLS 30 tablet 0   No current facility-administered medications for this visit.     Functional Status:  In your present state of health, do you have any difficulty performing the following activities: 03/12/2018 03/06/2018  Hearing? N N  Vision? N Y  Comment - -  Difficulty concentrating or making decisions? N N  Walking or climbing stairs? N N  Dressing or bathing? N N  Doing errands,  shopping? N Y  Comment - -  Conservation officer, nature and eating ? N N  Using the Toilet? N N  In the past six months, have you accidently leaked urine? N N  Do you have problems with loss of bowel control? N N  Managing your Medications? N N  Managing your Finances? N N  Housekeeping or managing your Housekeeping? N Y  Comment - -  Some recent data might be hidden    Fall/Depression Screening: Fall Risk  03/12/2018 03/06/2018 02/07/2018  Falls in the past year? 1 1 (No Data)  Comment - - Denies new/ recent falls  Number falls in past yr: 1 1 -  Comment - - -  Injury with Fall? 0 0 -  Risk Factor Category  - - -  Risk for fall due  to : History of fall(s);Impaired balance/gait History of fall(s);Impaired balance/gait;Impaired mobility -  Risk for fall due to: Comment - - -  Follow up - Falls evaluation completed;Falls prevention discussed -   PHQ 2/9 Scores 03/12/2018 03/06/2018 01/15/2018 10/17/2017 09/28/2017 08/31/2017 08/09/2016  PHQ - 2 Score 0 0 0 0 1 0 0  PHQ- 9 Score 0 - - - - - -    Assessment:  A1C 7.9 Patient is feeling good about progress and knows she can get the A1C even lower  Plan:  RN encouraged patient to keep up the progress RN sent Clinical Key education My plate from [place] sent patient  EMMI on educational material on serving sizes RN will follow up within the month of January.  [place] Care Management [phone]

## 2018-03-28 ENCOUNTER — Telehealth: Payer: Self-pay | Admitting: Family Medicine

## 2018-03-28 MED ORDER — INSULIN PEN NEEDLE 31G X 5 MM MISC: 1.0000 | Freq: Every day | 1 refills | Status: DC

## 2018-03-28 NOTE — Telephone Encounter (Signed)
E-scribed Ulticare pen needles 31G X 5 MM to [facility] [employer]

## 2018-03-28 NOTE — Telephone Encounter (Signed)
Julie/THN called office stating a new Rx request needs to be sent for the Ulticare brand pen needles for lancets. The pen needles the pt usually uses are on back order. She did speak with [facility] [place] and they do have the [employer] care brand available. It will require a ICD 10 code. Best cb # for [place] Free [phone]

## 2018-03-29 ENCOUNTER — Other Ambulatory Visit: Payer: Self-pay | Admitting: Pharmacist

## 2018-03-29 NOTE — Patient Outreach (Signed)
[facility]) Care Management  03/29/2018  Kristen [name] 1940/10/04 [id number]  Incoming call received from [name], HIPAA verified x 2. I have previously worked with [name] in 2019 assisting her with medication management.   [name] reports that she only has 2 BD pen needles left for her Lantus pens but her preferred pharmacy told her that BD pen needles are on back order.   I looked up other pen needles that will fit the Solostar device. [facility] had Ulticare pen needles of appropriate size in stock. Technician states that they do.   Contacted Dr. Burna Forts office to have new prescription for Ulticare brand pen needles sent in, including ICD 10 code.   Pharmacy case will remain closed.   [name], [place] [phone]

## 2018-04-21 NOTE — Progress Notes (Signed)
I reviewed health advisor's note, was available for consultation, and agree with documentation and plan.  

## 2018-04-25 ENCOUNTER — Other Ambulatory Visit: Payer: Self-pay | Admitting: *Deleted

## 2018-04-25 NOTE — Patient Outreach (Signed)
Telephone follow up on pt with Diabetes not at goal. Mrs. Devoto had made good progress in lowering her HgbA1C down to 7.5. Now since she has the Holidays behind her she is determined to get it down below 7.0. She reports her FBS are always <120 and no hypolycemic episodes. Her PM levels and <160. She probably isn't getting enough exercise. I think she would benefit from studying our educational tool, "ALL ABOUT CARBS" I  Have emphasized she needs to concentrate on what are appropriate serviing sizes and read her food labels looking at carb #s. I suggested she keep a food diary and count how many carbs she is getting and try to reduce that to 45 Gms per meal or less.  I will advise her assigned Health Coach, Johny Shock, RN of this intervention and to follow up on pt progress using the tool.  Kristen [name]. Myrtie Neither, MSN, [facility] Gerontological Nurse Practitioner [facility] Care Management [phone]

## 2018-05-06 ENCOUNTER — Other Ambulatory Visit: Payer: Self-pay | Admitting: Family Medicine

## 2018-05-06 DIAGNOSIS — Z1231 Encounter for screening mammogram for malignant neoplasm of breast: Secondary | ICD-10-CM

## 2018-05-14 ENCOUNTER — Other Ambulatory Visit: Payer: Self-pay | Admitting: Family Medicine

## 2018-05-17 ENCOUNTER — Other Ambulatory Visit: Payer: Self-pay | Admitting: Family Medicine

## 2018-05-27 ENCOUNTER — Other Ambulatory Visit: Payer: Self-pay | Admitting: *Deleted

## 2018-05-27 NOTE — Patient Outreach (Signed)
[facility]) Care Management  05/27/2018  Kristen [name] [place] Sep 20, 1940 [id number]   [place] attempted follow up outreach call to patient.  Patient was unavailable. HIPPA compliance voicemail message left with return callback number.  Plan: RN will call patient again within 30 days.  [place] Care Management [phone]

## 2018-05-27 NOTE — Patient Outreach (Signed)
[facility]) Care Management  05/27/2018   [place] [birth year] [id number]  RN Health Coach telephone call to patient.  Hipaa compliance verified. Patient fasting blood sugar is 119/ Patient states she tries not to eat after 5 pm. Per patient she is doing much better eating more healthier. Patient did not realize that she had received the educational material on dining out and low carb snacks. Per patient she would look at it. Patient stated that she has not been exercising as she should. Per patient she knows she needs to be a little more active. Patient has set up health maintenance appointments. Patient has agreed to follow up outreach calls.   Current Medications:  Current Outpatient Medications  Medication Sig Dispense Refill  . amLODipine (NORVASC) 5 MG tablet Take 1 tablet (5 mg total) by mouth daily. 90 tablet 1  . atorvastatin (LIPITOR) 40 MG tablet Take 1 tablet (40 mg total) by mouth daily. 90 tablet 3  . blood glucose meter kit and supplies KIT Dispense based on patient and insurance preference. Use up to four times daily as directed. (FOR ICD-9 250.00, 250.01). 1 each 0  . Cholecalciferol (VITAMIN D) 2000 units CAPS Take 1 capsule by mouth daily.    . clindamycin (CLEOCIN) 150 MG capsule Take 2 capsules (300 mg total) by mouth 2 (two) times daily. 28 capsule 0  . clopidogrel (PLAVIX) 75 MG tablet TAKE 1 TABLET BY MOUTH EVERY DAY (Patient taking differently: Take 75 mg by mouth daily. ) 90 tablet 3  . clotrimazole (LOTRIMIN AF) 1 % cream Apply 1 application topically 2 (two) times daily. Rash under breast (Patient taking differently: Apply 1 application topically as needed. Rash under breast) 45 g 1  . Colchicine (MITIGARE) 0.6 MG CAPS Take 1 tablet by mouth daily as needed (for gout). 30 capsule 3  . glucose blood (ONE TOUCH ULTRA TEST) test strip USE UP TO 4 TIMES DAILY AS DIRECTED Dx Code E11.8 E11.65 100 each 11  . hydrALAZINE (APRESOLINE) 50 MG tablet TAKE  1 TABLET BY MOUTH TWICE A DAY 180 tablet 1  . ibuprofen (ADVIL,MOTRIN) 400 MG tablet Take 1 tablet (400 mg total) by mouth every 6 (six) hours as needed. 30 tablet 0  . Insulin Glargine (LANTUS SOLOSTAR) 100 UNIT/ML Solostar Pen Inject 18 Units into the skin at bedtime. Qs 3 mo (Patient taking differently: Inject 18 Units into the skin at bedtime. ) 6 pen 3  . Insulin Pen Needle (B-D UF III MINI PEN NEEDLES) 31G X 5 MM MISC Inject 1 each as directed at bedtime. Use as directed to inject insulin.  Dx code: E11.8, E11.65 (Patient not taking: Reported on 03/28/2018) 100 each 1  . Insulin Pen Needle (ULTICARE PEN NEEDLES) 31G X 5 MM MISC 1 each by Does not apply route at bedtime. Use as directed at bedtime to inject insulin.  Dx code:  E11.8, E11.65 100 each 1  . losartan-hydrochlorothiazide (HYZAAR) 100-12.5 MG tablet Take 1 tablet by mouth daily. 90 tablet 1  . metoprolol tartrate (LOPRESSOR) 25 MG tablet Take 12.5 mg by mouth 2 (two) times daily.  3  . nitroGLYCERIN (NITROSTAT) 0.4 MG SL tablet Place 0.4 mg under the tongue every 5 (five) minutes as needed for chest pain.    [name] DELICA LANCETS FINE MISC USE U PTO 4 TIMES DAILY AS DIRECTED 100 each 2  . potassium chloride (K-DUR) 10 MEQ tablet TAKE 1 TABLET (10 MEQ TOTAL) BY MOUTH EVERY MONDAY, WEDNESDAY,  AND FRIDAY. NEED APPT FOR REFILLS 30 tablet 2   No current facility-administered medications for this visit.     Functional Status:  In your present state of health, do you have any difficulty performing the following activities: 05/27/2018 03/12/2018  Hearing? N N  Vision? Y N  Comment - -  Difficulty concentrating or making decisions? N N  Walking or climbing stairs? N N  Dressing or bathing? N N  Doing errands, shopping? Y [place] and eating ? N N  Using the Toilet? N N  In the past six months, have you accidently leaked urine? N N  Do you have problems with loss of bowel control? N N  Managing your  Medications? N N  Managing your Finances? N N  Housekeeping or managing your Housekeeping? Y N  Comment - -  Some recent data might be hidden    Fall/Depression Screening: Fall Risk  05/27/2018 03/12/2018 03/06/2018  Falls in the past year? _0 Comment - - -  Number falls in past yr: _1 Comment - - -  Injury with Fall? 0 0 0  Risk Factor Category  - - -  Risk for fall due to : History of fall(s);Impaired balance/gait;Impaired mobility History of fall(s);Impaired balance/gait History of fall(s);Impaired balance/gait;Impaired mobility  Risk for fall due to: Comment - - -  Follow up Falls evaluation completed;Falls prevention discussed - Falls evaluation completed;Falls prevention discussed   PHQ 2/9 Scores 05/27/2018 03/12/2018 03/06/2018 01/15/2018 10/17/2017 09/28/2017 08/31/2017  PHQ - 2 Score 0 0 0 0 0 1 0  PHQ- 9 Score - 0 - - - - -    Assessment:  Fasting blood sugar is 119 Per patient she is taking her medications as per ordered Patient is trying not to eat after 5 pm Patient will benefit from [place] telephonic outreach for education and support for diabetes self management.  Plan: RN discussed medication adherence RN discussed need exercise routine Mammogram set for February Eye exam is set for March A1C to be drawn in March RN will follow up within the month of April   Ziomara Birenbaum [facility] [phone]

## 2018-06-14 ENCOUNTER — Ambulatory Visit: Payer: Medicare Other

## 2018-07-02 ENCOUNTER — Ambulatory Visit (INDEPENDENT_AMBULATORY_CARE_PROVIDER_SITE_OTHER): Payer: Medicare Other | Admitting: Family Medicine

## 2018-07-02 ENCOUNTER — Encounter: Payer: Self-pay | Admitting: Family Medicine

## 2018-07-02 VITALS — BP 130/74 | HR 63 | Temp 98.4°F | Ht 62.0 in | Wt 203.4 lb

## 2018-07-02 DIAGNOSIS — IMO0002 Reserved for concepts with insufficient information to code with codable children: Secondary | ICD-10-CM

## 2018-07-02 DIAGNOSIS — I10 Essential (primary) hypertension: Secondary | ICD-10-CM

## 2018-07-02 DIAGNOSIS — N289 Disorder of kidney and ureter, unspecified: Secondary | ICD-10-CM | POA: Diagnosis not present

## 2018-07-02 DIAGNOSIS — R946 Abnormal results of thyroid function studies: Secondary | ICD-10-CM

## 2018-07-02 DIAGNOSIS — E118 Type 2 diabetes mellitus with unspecified complications: Secondary | ICD-10-CM

## 2018-07-02 DIAGNOSIS — E1165 Type 2 diabetes mellitus with hyperglycemia: Secondary | ICD-10-CM

## 2018-07-02 LAB — RENAL FUNCTION PANEL
Albumin: 3.7 g/dL (ref 3.5–5.2)
BUN: 22 mg/dL (ref 6–23)
CO2: 31 mEq/L (ref 19–32)
Calcium: 9.9 mg/dL (ref 8.4–10.5)
Chloride: 101 mEq/L (ref 96–112)
Creatinine, Ser: 1.15 mg/dL (ref 0.40–1.20)
GFR: 55.38 mL/min — ABNORMAL LOW (ref >60.00)
Glucose, Bld: 178 mg/dL — ABNORMAL HIGH (ref 70–99)
Phosphorus: 3.4 mg/dL (ref 2.3–4.6)
Potassium: 3.7 mEq/L (ref 3.5–5.1)
SODIUM: 139 meq/L (ref 135–145)

## 2018-07-02 LAB — POCT GLYCOSYLATED HEMOGLOBIN (HGB A1C): Hemoglobin A1C: 8.6 % — AB (ref 4.0–5.6)

## 2018-07-02 LAB — TSH: TSH: 5.4 u[IU]/mL — AB (ref 0.35–4.50)

## 2018-07-02 LAB — T4, FREE: Free T4: 0.99 ng/dL (ref 0.60–1.60)

## 2018-07-02 MED ORDER — LOSARTAN POTASSIUM-HCTZ 100-12.5 MG PO TABS: 1.0000 | ORAL_TABLET | Freq: Every day | ORAL | 3 refills | Status: DC

## 2018-07-02 MED ORDER — INSULIN GLARGINE 100 UNIT/ML SOLOSTAR PEN: 18.0000 [IU] | PEN_INJECTOR | Freq: Every day | SUBCUTANEOUS | 3 refills | Status: DC

## 2018-07-02 MED ORDER — ATORVASTATIN CALCIUM 40 MG PO TABS: 40.0000 mg | ORAL_TABLET | Freq: Every day | ORAL | 3 refills | Status: DC

## 2018-07-02 MED ORDER — INSULIN PEN NEEDLE 31G X 5 MM MISC: 1.0000 | Freq: Every day | 1 refills | Status: DC

## 2018-07-02 MED ORDER — AMLODIPINE BESYLATE 5 MG PO TABS: 5.0000 mg | ORAL_TABLET | Freq: Every day | ORAL | 3 refills | Status: DC

## 2018-07-02 MED ORDER — POTASSIUM CHLORIDE ER 10 MEQ PO TBCR: 10.0000 meq | EXTENDED_RELEASE_TABLET | ORAL | 3 refills | Status: DC

## 2018-07-02 MED ORDER — CLOPIDOGREL BISULFATE 75 MG PO TABS: 75.0000 mg | ORAL_TABLET | Freq: Every day | ORAL | 3 refills | Status: DC

## 2018-07-02 NOTE — Patient Instructions (Signed)
Labs today. Watch diet - work on low sugar low carbs to help improve sugar readings.  If staying high next time, we may need to increase insulin Return in 3 months for diabetes follow up visit.

## 2018-07-02 NOTE — Assessment & Plan Note (Signed)
Update Cr. Previously bumped.

## 2018-07-02 NOTE — Assessment & Plan Note (Signed)
Update TFTs ° °

## 2018-07-02 NOTE — Progress Notes (Signed)
BP 130/74 (BP Location: Left Arm, Patient Position: Sitting, Cuff Size: Large)   Pulse 63   Temp 98.4 F (36.9 C) (Oral)   Ht '5\' 2"'  (1.575 m)   Wt 203 lb 7 oz (92.3 kg)   SpO2 99%   BMI 37.21 kg/m    CC: DM 4 mo f/u visit Subjective:    Patient ID: Kristen [name], female    DOB: 1940-05-31, 78 y.o.   MRN: [id number]  HPI: Kristen [name] is a 78 y.o. female presenting on 07/02/2018 for Diabetes (Here for 3 mo f/u. )   Increased stress - son in hospital recently.   DM - does intermittently check sugars fasting 110-120, PM 200s. Compliant with antihyperglycemic regimen which includes: lantus 18u at bedtime. Denies low sugars or hypoglycemic symptoms. Denies paresthesias. Last diabetic eye exam 11/2013. Pneumovax: 2016. Prevnar: 2015. Glucometer brand: onetouch. DSME: declined. Lab Results  Component Value Date   HGBA1C 8.6 (A) 07/02/2018   Diabetic Foot Exam - Simple   Simple Foot Form Diabetic Foot exam was performed with the following findings:  Yes 07/02/2018 10:01 AM  Visual Inspection No deformities, no ulcerations, no other skin breakdown bilaterally:  Yes Sensation Testing Intact to touch and monofilament testing bilaterally:  Yes Pulse Check Posterior Tibialis and Dorsalis pulse intact bilaterally:  Yes Comments    Lab Results  Component Value Date   MICROALBUR 7.8 (H) 08/09/2016    Renal insufficiency - due for recheck. Limits NSAIDs.      Relevant past medical, surgical, family and social history reviewed and updated as indicated. Interim medical history since our last visit reviewed. Allergies and medications reviewed and updated. Outpatient Medications Prior to Visit  Medication Sig Dispense Refill  . blood glucose meter kit and supplies KIT Dispense based on patient and insurance preference. Use up to four times daily as directed. (FOR ICD-9 250.00, 250.01). 1 each 0  . Cholecalciferol (VITAMIN D) 2000 units CAPS Take 1 capsule by mouth daily.    .  clindamycin (CLEOCIN) 150 MG capsule Take 2 capsules (300 mg total) by mouth 2 (two) times daily. 28 capsule 0  . clotrimazole (LOTRIMIN AF) 1 % cream Apply 1 application topically 2 (two) times daily. Rash under breast (Patient taking differently: Apply 1 application topically as needed. Rash under breast) 45 g 1  . Colchicine (MITIGARE) 0.6 MG CAPS Take 1 tablet by mouth daily as needed (for gout). 30 capsule 3  . glucose blood (ONE TOUCH ULTRA TEST) test strip USE UP TO 4 TIMES DAILY AS DIRECTED Dx Code E11.8 E11.65 100 each 11  . hydrALAZINE (APRESOLINE) 50 MG tablet TAKE 1 TABLET BY MOUTH TWICE A DAY 180 tablet 1  . ibuprofen (ADVIL,MOTRIN) 400 MG tablet Take 1 tablet (400 mg total) by mouth every 6 (six) hours as needed. 30 tablet 0  . metoprolol tartrate (LOPRESSOR) 25 MG tablet Take 12.5 mg by mouth 2 (two) times daily.  3  . nitroGLYCERIN (NITROSTAT) 0.4 MG SL tablet Place 0.4 mg under the tongue every 5 (five) minutes as needed for chest pain.    [name] DELICA LANCETS FINE MISC USE U PTO 4 TIMES DAILY AS DIRECTED 100 each 2  . amLODipine (NORVASC) 5 MG tablet Take 1 tablet (5 mg total) by mouth daily. 90 tablet 1  . atorvastatin (LIPITOR) 40 MG tablet Take 1 tablet (40 mg total) by mouth daily. 90 tablet 3  . clopidogrel (PLAVIX) 75 MG tablet TAKE 1 TABLET BY MOUTH  EVERY DAY (Patient taking differently: Take 75 mg by mouth daily. ) 90 tablet 3  . Insulin Glargine (LANTUS SOLOSTAR) 100 UNIT/ML Solostar Pen Inject 18 Units into the skin at bedtime. Qs 3 mo (Patient taking differently: Inject 18 Units into the skin at bedtime. ) 6 pen 3  . Insulin Pen Needle (B-D UF III MINI PEN NEEDLES) 31G X 5 MM MISC Inject 1 each as directed at bedtime. Use as directed to inject insulin.  Dx code: E11.8, E11.65 100 each 1  . losartan-hydrochlorothiazide (HYZAAR) 100-12.5 MG tablet Take 1 tablet by mouth daily. 90 tablet 1  . potassium chloride (K-DUR) 10 MEQ tablet TAKE 1 TABLET (10 MEQ TOTAL) BY MOUTH  EVERY MONDAY, WEDNESDAY, AND FRIDAY. NEED APPT FOR REFILLS 30 tablet 2  . Insulin Pen Needle (ULTICARE PEN NEEDLES) 31G X 5 MM MISC 1 each by Does not apply route at bedtime. Use as directed at bedtime to inject insulin.  Dx code:  E11.8, E11.65 100 each 1   No facility-administered medications prior to visit.      Per HPI unless specifically indicated in ROS section below Review of Systems Objective:    BP 130/74 (BP Location: Left Arm, Patient Position: Sitting, Cuff Size: Large)   Pulse 63   Temp 98.4 F (36.9 C) (Oral)   Ht '5\' 2"'  (1.575 m)   Wt 203 lb 7 oz (92.3 kg)   SpO2 99%   BMI 37.21 kg/m   Wt Readings from Last 3 Encounters:  07/02/18 203 lb 7 oz (92.3 kg)  03/18/18 200 lb 12 oz (91.1 kg)  03/12/18 198 lb 8 oz (90 kg)    Physical Exam Vitals signs and nursing note reviewed.  Constitutional:      General: She is not in acute distress.    Appearance: Normal appearance. She is well-developed.  HENT:     Head: Normocephalic and atraumatic.     Right Ear: External ear normal.     Left Ear: External ear normal.     Nose: Nose normal.     Mouth/Throat:     Mouth: Mucous membranes are moist.     Pharynx: No oropharyngeal exudate.  Eyes:     General: No scleral icterus.    Conjunctiva/sclera: Conjunctivae normal.     Pupils: Pupils are equal, round, and reactive to light.  Neck:     Musculoskeletal: Normal range of motion and neck supple.  Cardiovascular:     Rate and Rhythm: Normal rate and regular rhythm.     Pulses: Normal pulses.     Heart sounds: Normal heart sounds. No murmur.  Pulmonary:     Effort: Pulmonary effort is normal. No respiratory distress.     Breath sounds: Normal breath sounds. No wheezing, rhonchi or rales.  Musculoskeletal:     Comments: See HPI for foot exam if done  Lymphadenopathy:     Cervical: No cervical adenopathy.  Skin:    General: Skin is warm and dry.     Findings: No rash.  Neurological:     Mental Status: She is alert.        Results for orders placed or performed in visit on 07/02/18  POCT glycosylated hemoglobin (Hb A1C)  Result Value Ref Range   Hemoglobin A1C 8.6 (A) 4.0 - 5.6 %   HbA1c POC (<> result, manual entry)     HbA1c, POC (prediabetic range)     HbA1c, POC (controlled diabetic range)     Assessment & Plan:  Problem List Items Addressed This Visit    Thyroid function test abnormal    Update TFTs.      Relevant Orders   TSH   T4, free   Renal insufficiency    Update Cr. Previously bumped.       Relevant Orders   Renal function panel   Essential hypertension    Chronic, stable. Continue current regimen.       Relevant Medications   losartan-hydrochlorothiazide (HYZAAR) 100-12.5 MG tablet   amLODipine (NORVASC) 5 MG tablet   atorvastatin (LIPITOR) 40 MG tablet   Diabetes mellitus type 2, uncontrolled, with complications (HCC) - Primary    Chronic, deteriorated - pt attributes to increased stress from son's critical illness. She has not been following diabetic diet. She will renew efforts in the next 3 months and reassess at that time. Pt agrees, declines med change at this time.       Relevant Medications   losartan-hydrochlorothiazide (HYZAAR) 100-12.5 MG tablet   atorvastatin (LIPITOR) 40 MG tablet   Insulin Glargine (LANTUS SOLOSTAR) 100 UNIT/ML Solostar Pen   Other Relevant Orders   POCT glycosylated hemoglobin (Hb A1C) (Completed)       Meds ordered this encounter  Medications  . losartan-hydrochlorothiazide (HYZAAR) 100-12.5 MG tablet    Sig: Take 1 tablet by mouth daily.    Dispense:  90 tablet    Refill:  3  . potassium chloride (K-DUR) 10 MEQ tablet    Sig: Take 1 tablet (10 mEq total) by mouth every Monday, Wednesday, and Friday.    Dispense:  40 tablet    Refill:  3  . amLODipine (NORVASC) 5 MG tablet    Sig: Take 1 tablet (5 mg total) by mouth daily.    Dispense:  90 tablet    Refill:  3  . clopidogrel (PLAVIX) 75 MG tablet    Sig: Take 1 tablet  (75 mg total) by mouth daily.    Dispense:  90 tablet    Refill:  3  . atorvastatin (LIPITOR) 40 MG tablet    Sig: Take 1 tablet (40 mg total) by mouth daily.    Dispense:  90 tablet    Refill:  3  . Insulin Pen Needle (B-D UF III MINI PEN NEEDLES) 31G X 5 MM MISC    Sig: Inject 1 each as directed at bedtime. Use as directed to inject insulin.  Dx code: E11.8, E11.65    Dispense:  100 each    Refill:  1  . Insulin Glargine (LANTUS SOLOSTAR) 100 UNIT/ML Solostar Pen    Sig: Inject 18 Units into the skin at bedtime. Qs 3 mo    Dispense:  6 pen    Refill:  3    Note new sig   Orders Placed This Encounter  Procedures  . Renal function panel  . TSH  . T4, free  . POCT glycosylated hemoglobin (Hb A1C)    Follow up plan: Return in about 3 months (around 10/02/2018) for follow up visit.  [name], MD

## 2018-07-02 NOTE — Assessment & Plan Note (Signed)
Chronic, stable. Continue current regimen. 

## 2018-07-02 NOTE — Assessment & Plan Note (Signed)
Chronic, deteriorated - pt attributes to increased stress from son's critical illness. She has not been following diabetic diet. She will renew efforts in the next 3 months and reassess at that time. Pt agrees, declines med change at this time.

## 2018-07-04 ENCOUNTER — Other Ambulatory Visit (INDEPENDENT_AMBULATORY_CARE_PROVIDER_SITE_OTHER): Payer: Medicare Other

## 2018-07-04 DIAGNOSIS — Z1211 Encounter for screening for malignant neoplasm of colon: Secondary | ICD-10-CM | POA: Diagnosis not present

## 2018-07-04 LAB — FECAL OCCULT BLOOD, IMMUNOCHEMICAL: Fecal Occult Bld: NEGATIVE

## 2018-07-04 LAB — FECAL OCCULT BLOOD, GUAIAC: Fecal Occult Blood: NEGATIVE

## 2018-07-06 ENCOUNTER — Encounter: Payer: Self-pay | Admitting: Family Medicine

## 2018-07-06 ENCOUNTER — Other Ambulatory Visit: Payer: Self-pay | Admitting: Family Medicine

## 2018-07-08 ENCOUNTER — Encounter: Payer: Self-pay | Admitting: Family Medicine

## 2018-07-12 ENCOUNTER — Inpatient Hospital Stay: Admission: RE | Admit: 2018-07-12 | Payer: Medicare Other | Source: Ambulatory Visit

## 2018-07-29 ENCOUNTER — Other Ambulatory Visit: Payer: Self-pay | Admitting: *Deleted

## 2018-07-29 ENCOUNTER — Other Ambulatory Visit: Payer: Self-pay

## 2018-07-29 NOTE — Patient Outreach (Signed)
[facility]) Care Management  07/29/2018   Kristen [name] December 17, 1940 [id number]  RN Health Coach telephone call to patient.  Hipaa compliance verified. Per patient she is doing good. Patient stated that she had gotten off her diet while her son was ill. Patient stated that she is back on her diet and is trying to eat healthy. Fasting blood sugar is 163 today. Per patient last night it was 201. Patient stated that her mammogram and eye exam has been cancelled due to the coronavirus and will be rescheduled at a later date. Per patient she is following the guideline of distance for the virus. Patient has agreed to follow up outreach calls.    Current Medications:  Current Outpatient Medications  Medication Sig Dispense Refill  . amLODipine (NORVASC) 5 MG tablet Take 1 tablet (5 mg total) by mouth daily. 90 tablet 3  . atorvastatin (LIPITOR) 40 MG tablet Take 1 tablet (40 mg total) by mouth daily. 90 tablet 3  . blood glucose meter kit and supplies KIT Dispense based on patient and insurance preference. Use up to four times daily as directed. (FOR ICD-9 250.00, 250.01). 1 each 0  . Cholecalciferol (VITAMIN D) 2000 units CAPS Take 1 capsule by mouth daily.    . clindamycin (CLEOCIN) 150 MG capsule Take 2 capsules (300 mg total) by mouth 2 (two) times daily. 28 capsule 0  . clopidogrel (PLAVIX) 75 MG tablet Take 1 tablet (75 mg total) by mouth daily. 90 tablet 3  . clotrimazole (LOTRIMIN AF) 1 % cream Apply 1 application topically 2 (two) times daily. Rash under breast (Patient taking differently: Apply 1 application topically as needed. Rash under breast) 45 g 1  . Colchicine (MITIGARE) 0.6 MG CAPS Take 1 tablet by mouth daily as needed (for gout). 30 capsule 3  . glucose blood (ONE TOUCH ULTRA TEST) test strip USE UP TO 4 TIMES DAILY AS DIRECTED Dx Code E11.8 E11.65 100 each 11  . hydrALAZINE (APRESOLINE) 50 MG tablet TAKE 1 TABLET BY MOUTH TWICE A DAY 180 tablet 1  . ibuprofen  (ADVIL,MOTRIN) 400 MG tablet Take 1 tablet (400 mg total) by mouth every 6 (six) hours as needed. 30 tablet 0  . Insulin Glargine (LANTUS SOLOSTAR) 100 UNIT/ML Solostar Pen Inject 18 Units into the skin at bedtime. Qs 3 mo 6 pen 3  . Insulin Pen Needle (B-D UF III MINI PEN NEEDLES) 31G X 5 MM MISC Inject 1 each as directed at bedtime. Use as directed to inject insulin.  Dx code: E11.8, E11.65 100 each 1  . losartan-hydrochlorothiazide (HYZAAR) 100-12.5 MG tablet Take 1 tablet by mouth daily. 90 tablet 3  . metoprolol tartrate (LOPRESSOR) 25 MG tablet Take 12.5 mg by mouth 2 (two) times daily.  3  . nitroGLYCERIN (NITROSTAT) 0.4 MG SL tablet Place 0.4 mg under the tongue every 5 (five) minutes as needed for chest pain.    Kristen [name] DELICA LANCETS FINE MISC USE U PTO 4 TIMES DAILY AS DIRECTED 100 each 2  . potassium chloride (K-DUR) 10 MEQ tablet Take 1 tablet (10 mEq total) by mouth every Monday, Wednesday, and Friday. 40 tablet 3   No current facility-administered medications for this visit.     Functional Status:  In your present state of health, do you have any difficulty performing the following activities: 07/29/2018 05/27/2018  Hearing? N N  Vision? Kristen [name]  Comment Eye exam was canceled due to cornavirus/ Patient will reschedule -  Difficulty concentrating  or making decisions? N N  Walking or climbing stairs? N N  Dressing or bathing? N N  Doing errands, shopping? Kristen [name]  Preparing Food and eating ? N N  Using the Toilet? N N  In the past six months, have you accidently leaked urine? N N  Do you have problems with loss of bowel control? N N  Managing your Medications? N N  Managing your Finances? N N  Housekeeping or managing your Housekeeping? N Y  Some recent data might be hidden    Fall/Depression Screening: Fall Risk  07/29/2018 05/27/2018 03/12/2018  Falls in the past year? '1 1 1  ' Comment - - -  Number falls in past yr: '1 1 1  ' Comment - - -  Injury with Fall? 0 0 0  Risk Factor  Category  - - -  Risk for fall due to : History of fall(s);Impaired balance/gait;Impaired mobility History of fall(s);Impaired balance/gait;Impaired mobility History of fall(s);Impaired balance/gait  Risk for fall due to: Comment - - -  Follow up Falls evaluation completed;Falls prevention discussed Falls evaluation completed;Falls prevention discussed -   PHQ 2/9 Scores 07/29/2018 05/27/2018 03/12/2018 03/06/2018 01/15/2018 10/17/2017 09/28/2017  PHQ - 2 Score 0 0 0 0 0 0 1  PHQ- 9 Score - - 0 - - - -   THN CM Care Plan Problem One     Most Recent Value  Care Plan Problem One  knowledge Deficit in self management of Diabetes  Role Documenting the Problem One  [place] for Problem One  Active  THN Long Term Goal   Patient will continue to see a decrease in A1C from 7.9 withn the next 90 days  THN Long Term Goal Start Date  07/29/18  Interventions for Problem One Long Term Goal  RN reiterated to patient to staying on diet. Patient stated she is back on diest now. Per patient she had been going through a lot of stress. RN willfollow up for compliance  THN CM Short Term Goal #1   Patient will be able to verbalize healthy snack choices within the next 30 days  THN CM Short Term Goal #1 Start Date  07/29/18  Interventions for Short Term Goal #1  RN reiterated eating healthy snacks and sticking with her diet. RN will follow up with further discussion  THN CM Short Term Goal #2   Patient will be able to verbalize foods to choose of of dine out menus within the next 30 days  THN CM Short Term Goal #2 Start Date  07/29/18  Interventions for Short Term Goal #2  RN discussed that patient has a list of food choices for dining out. Patient is confined at home due to virus. RN will follow up for compliance once patient states she is going out again.       Assessment:  Fasting blood sugar is 163 A1C 8.6 Patient had gotten off diet Per patient she is eating better today Eye exam and mammogram  cancelled due to coronavirus   Plan:  RN discussed healthy eating and adhering to diet Patient will start adhering to diet Patient has adequate food and supplies during the coronavirus epidemic RN will follow up within the month of July  Jafeth Mustin [place] Management [phone]

## 2018-08-14 ENCOUNTER — Ambulatory Visit: Payer: Medicare Other

## 2018-09-27 ENCOUNTER — Other Ambulatory Visit: Payer: Self-pay | Admitting: *Deleted

## 2018-09-27 NOTE — Patient Outreach (Signed)
[facility]) Care Management  09/27/2018   Kristen [name] [birth year] [id number]  RN Health Coach telephone call to patient.  Hipaa compliance verified. Patient fasting blood sugar is 137. Per patient she is doing much better ambulating. Patient is not having to use her cane. Per patient she has gained weight because she has been off her diet.  Per patient she is working on getting back to eating portion control and healthy diet. Patient had agreed to further outreach calls.   Current Medications:  Current Outpatient Medications  Medication Sig Dispense Refill  . amLODipine (NORVASC) 5 MG tablet Take 1 tablet (5 mg total) by mouth daily. 90 tablet 3  . atorvastatin (LIPITOR) 40 MG tablet Take 1 tablet (40 mg total) by mouth daily. 90 tablet 3  . blood glucose meter kit and supplies KIT Dispense based on patient and insurance preference. Use up to four times daily as directed. (FOR ICD-9 250.00, 250.01). 1 each 0  . Cholecalciferol (VITAMIN D) 2000 units CAPS Take 1 capsule by mouth daily.    . clindamycin (CLEOCIN) 150 MG capsule Take 2 capsules (300 mg total) by mouth 2 (two) times daily. 28 capsule 0  . clopidogrel (PLAVIX) 75 MG tablet Take 1 tablet (75 mg total) by mouth daily. 90 tablet 3  . clotrimazole (LOTRIMIN AF) 1 % cream Apply 1 application topically 2 (two) times daily. Rash under breast (Patient taking differently: Apply 1 application topically as needed. Rash under breast) 45 g 1  . Colchicine (MITIGARE) 0.6 MG CAPS Take 1 tablet by mouth daily as needed (for gout). 30 capsule 3  . glucose blood (ONE TOUCH ULTRA TEST) test strip USE UP TO 4 TIMES DAILY AS DIRECTED Dx Code E11.8 E11.65 100 each 11  . hydrALAZINE (APRESOLINE) 50 MG tablet TAKE 1 TABLET BY MOUTH TWICE A DAY 180 tablet 1  . ibuprofen (ADVIL,MOTRIN) 400 MG tablet Take 1 tablet (400 mg total) by mouth every 6 (six) hours as needed. 30 tablet 0  . Insulin Glargine (LANTUS SOLOSTAR) 100 UNIT/ML Solostar  Pen Inject 18 Units into the skin at bedtime. Qs 3 mo 6 pen 3  . Insulin Pen Needle (B-D UF III MINI PEN NEEDLES) 31G X 5 MM MISC Inject 1 each as directed at bedtime. Use as directed to inject insulin.  Dx code: E11.8, E11.65 100 each 1  . losartan-hydrochlorothiazide (HYZAAR) 100-12.5 MG tablet Take 1 tablet by mouth daily. 90 tablet 3  . metoprolol tartrate (LOPRESSOR) 25 MG tablet Take 12.5 mg by mouth 2 (two) times daily.  3  . nitroGLYCERIN (NITROSTAT) 0.4 MG SL tablet Place 0.4 mg under the tongue every 5 (five) minutes as needed for chest pain.    [name] DELICA LANCETS FINE MISC USE U PTO 4 TIMES DAILY AS DIRECTED 100 each 2  . potassium chloride (K-DUR) 10 MEQ tablet Take 1 tablet (10 mEq total) by mouth every Monday, Wednesday, and Friday. 40 tablet 3   No current facility-administered medications for this visit.     Functional Status:  In your present state of health, do you have any difficulty performing the following activities: 09/27/2018 09/27/2018  Hearing? N N  Vision? Y Y  Comment - rescheduling eye exam this month  Difficulty concentrating or making decisions? N -  Walking or climbing stairs? N -  Dressing or bathing? N -  Doing errands, shopping? Y -  Comment Patient does not have transportation Facilities manager and eating ? N -  Using the Toilet? N -  In the past six months, have you accidently leaked urine? N -  Do you have problems with loss of bowel control? N -  Managing your Medications? N -  Managing your Finances? N -  Housekeeping or managing your Housekeeping? N -  Some recent data might be hidden    Fall/Depression Screening: Fall Risk  09/27/2018 07/29/2018 05/27/2018  Falls in the past year? _0 Comment - - -  Number falls in past yr: _1 Comment - - -  Injury with Fall? 0 0 0  Risk Factor Category  - - -  Risk for fall due to : History of fall(s) History of fall(s);Impaired balance/gait;Impaired mobility History of fall(s);Impaired  balance/gait;Impaired mobility  Risk for fall due to: Comment - - -  Follow up Falls evaluation completed;Falls prevention discussed Falls evaluation completed;Falls prevention discussed Falls evaluation completed;Falls prevention discussed   PHQ 2/9 Scores 09/27/2018 07/29/2018 05/27/2018 03/12/2018 03/06/2018 01/15/2018 10/17/2017  PHQ - 2 Score 0 0 0 0 0 0 0  PHQ- 9 Score - - - 0 - - -   THN CM Care Plan Problem One     Most Recent Value  Care Plan Problem One  knowledge Deficit in self management of Diabetes  Role Documenting the Problem One  [place] for Problem One  Active  THN Long Term Goal   Patient will continue to see a decrease in A1C from 7.9 withn the next 90 days  Interventions for Problem One Long Term Goal  RN reiterated staying on her diet. RN reiterated rescheduling appointment. RN will follow up with further discussion  THN CM Short Term Goal #1   Patient will be able to verbalize healthy snack choices within the next 30 days  Interventions for Short Term Goal #1  Rn discussed with patient patient to stick with diet. Patient stated she is trying to get back on it. RN will follow up with compliance  THN CM Short Term Goal #2   Patient will be able to verbalize foods to choose of of dine out menus within the next 30 days  Interventions for Short Term Goal #2  Patient has not been able to eat out with the pandemic but is planning to once she is able to get ot. RN will follow up with further discussion       Assessment:  Fasting blood sugar is 137 Per patient she has gained some weight Patient is working at getting back on diet Patient needs support and encouragement  Plan:  RN discussed rescheduling mammogram and eye exam Patient is following coronavirus safety precautions RN discussed with patient about healthy eating and maintaining her diet RN will follow up within the month of September.  [place] [facility]  [facility] [phone]

## 2018-10-10 ENCOUNTER — Other Ambulatory Visit: Payer: Self-pay

## 2018-10-10 NOTE — Patient Outreach (Signed)
[facility]) Care Management  10/10/2018  Kristen [name] June 07, 1940 [id number]   Medication Adherence call to Kristen [name] patient did not answer patient is past due on Losartan/Hctz 100/12.5 mg and Atorvastatin 40 mg under [place].   [facility] Direct Dial [phone]  Fax [phone] [name]

## 2018-10-15 DIAGNOSIS — Z961 Presence of intraocular lens: Secondary | ICD-10-CM | POA: Diagnosis not present

## 2018-10-15 LAB — HM DIABETES EYE EXAM

## 2018-10-16 ENCOUNTER — Other Ambulatory Visit: Payer: Self-pay

## 2018-10-16 ENCOUNTER — Ambulatory Visit
Admission: RE | Admit: 2018-10-16 | Discharge: 2018-10-16 | Disposition: A | Discharge status: Home / Self Care | Payer: Medicare Other | Source: Ambulatory Visit | Attending: Family Medicine | Admitting: Family Medicine

## 2018-10-16 DIAGNOSIS — Z1231 Encounter for screening mammogram for malignant neoplasm of breast: Secondary | ICD-10-CM

## 2018-10-17 ENCOUNTER — Encounter: Payer: Self-pay | Admitting: Family Medicine

## 2018-10-17 ENCOUNTER — Ambulatory Visit (INDEPENDENT_AMBULATORY_CARE_PROVIDER_SITE_OTHER): Payer: Medicare Other | Admitting: Family Medicine

## 2018-10-17 ENCOUNTER — Other Ambulatory Visit: Payer: Self-pay

## 2018-10-17 VITALS — BP 132/68 | HR 55 | Temp 97.6°F | Ht 62.0 in | Wt 204.3 lb

## 2018-10-17 DIAGNOSIS — I10 Essential (primary) hypertension: Secondary | ICD-10-CM

## 2018-10-17 DIAGNOSIS — E1165 Type 2 diabetes mellitus with hyperglycemia: Secondary | ICD-10-CM

## 2018-10-17 DIAGNOSIS — IMO0002 Reserved for concepts with insufficient information to code with codable children: Secondary | ICD-10-CM

## 2018-10-17 DIAGNOSIS — E118 Type 2 diabetes mellitus with unspecified complications: Secondary | ICD-10-CM | POA: Diagnosis not present

## 2018-10-17 LAB — POCT GLYCOSYLATED HEMOGLOBIN (HGB A1C): Hemoglobin A1C: 8.6 % — AB (ref 4.0–5.6)

## 2018-10-17 MED ORDER — SITAGLIPTIN PHOSPHATE 25 MG PO TABS: 25.0000 mg | ORAL_TABLET | Freq: Every day | ORAL | 6 refills | Status: DC

## 2018-10-17 NOTE — Assessment & Plan Note (Addendum)
Chronic, unchanged but above goal. rec price out [place]. If unaffordable, consider restarting actos. Unsure why this was discontinued. She declines DSME again today.

## 2018-10-17 NOTE — Progress Notes (Signed)
This visit was conducted in person.  BP 132/68 (BP Location: Left Arm, Patient Position: Sitting, Cuff Size: Large)   Pulse (!) 55   Temp 97.6 F (36.4 C) (Temporal)   Ht '5\' 2"'  (1.575 m)   Wt 204 lb 5 oz (92.7 kg)   SpO2 98%   BMI 37.37 kg/m    CC: DM f/u visit Subjective:    Patient ID: Kristen [name], female    DOB: 1941-01-14, 78 y.o.   MRN: [id number]  HPI: Kristen [name] is a 78 y.o. female presenting on 10/17/2018 for Diabetes (Here for 3-4 mo f/u.)   DM - does intermittently check sugars - yesterday 122 fasting. Compliant with antihyperglycemic regimen which includes: lantus 18 at bedtime. Attributes high sugars to poor night time snacking choices. She has started eating mixed dry fruit snacks in place of chips for night time snacking. Denies low sugars or hypoglycemic symptoms. Denies paresthesias. Last diabetic eye exam 09/2018. Pneumovax: 2016. Prevnar: 2015. Glucometer brand: onetouch. DSME: declines for now. Lab Results  Component Value Date   HGBA1C 8.6 (A) 10/17/2018   Diabetic Foot Exam - Simple   No data filed     Lab Results  Component Value Date   MICROALBUR 7.8 (H) 08/09/2016         Relevant past medical, surgical, family and social history reviewed and updated as indicated. Interim medical history since our last visit reviewed. Allergies and medications reviewed and updated. Outpatient Medications Prior to Visit  Medication Sig Dispense Refill  . amLODipine (NORVASC) 5 MG tablet Take 1 tablet (5 mg total) by mouth daily. 90 tablet 3  . atorvastatin (LIPITOR) 40 MG tablet Take 1 tablet (40 mg total) by mouth daily. 90 tablet 3  . blood glucose meter kit and supplies KIT Dispense based on patient and insurance preference. Use up to four times daily as directed. (FOR ICD-9 250.00, 250.01). 1 each 0  . Cholecalciferol (VITAMIN D) 2000 units CAPS Take 1 capsule by mouth daily.    . clindamycin (CLEOCIN) 150 MG capsule Take 2 capsules (300 mg total) by  mouth 2 (two) times daily. 28 capsule 0  . clopidogrel (PLAVIX) 75 MG tablet Take 1 tablet (75 mg total) by mouth daily. 90 tablet 3  . clotrimazole (LOTRIMIN AF) 1 % cream Apply 1 application topically 2 (two) times daily. Rash under breast (Patient taking differently: Apply 1 application topically as needed. Rash under breast) 45 g 1  . Colchicine (MITIGARE) 0.6 MG CAPS Take 1 tablet by mouth daily as needed (for gout). 30 capsule 3  . glucose blood (ONE TOUCH ULTRA TEST) test strip USE UP TO 4 TIMES DAILY AS DIRECTED Dx Code E11.8 E11.65 100 each 11  . hydrALAZINE (APRESOLINE) 50 MG tablet TAKE 1 TABLET BY MOUTH TWICE A DAY 180 tablet 1  . ibuprofen (ADVIL,MOTRIN) 400 MG tablet Take 1 tablet (400 mg total) by mouth every 6 (six) hours as needed. 30 tablet 0  . Insulin Glargine (LANTUS SOLOSTAR) 100 UNIT/ML Solostar Pen Inject 18 Units into the skin at bedtime. Qs 3 mo 6 pen 3  . Insulin Pen Needle (B-D UF III MINI PEN NEEDLES) 31G X 5 MM MISC Inject 1 each as directed at bedtime. Use as directed to inject insulin.  Dx code: E11.8, E11.65 100 each 1  . losartan-hydrochlorothiazide (HYZAAR) 100-12.5 MG tablet Take 1 tablet by mouth daily. 90 tablet 3  . metoprolol tartrate (LOPRESSOR) 25 MG tablet Take 12.5 mg by mouth  2 (two) times daily.  3  . nitroGLYCERIN (NITROSTAT) 0.4 MG SL tablet Place 0.4 mg under the tongue every 5 (five) minutes as needed for chest pain.    [name] DELICA LANCETS FINE MISC USE U PTO 4 TIMES DAILY AS DIRECTED 100 each 2  . potassium chloride (K-DUR) 10 MEQ tablet Take 1 tablet (10 mEq total) by mouth every Monday, Wednesday, and Friday. 40 tablet 3   No facility-administered medications prior to visit.      Per HPI unless specifically indicated in ROS section below Review of Systems Objective:    BP 132/68 (BP Location: Left Arm, Patient Position: Sitting, Cuff Size: Large)   Pulse (!) 55   Temp 97.6 F (36.4 C) (Temporal)   Ht '5\' 2"'  (1.575 m)   Wt 204 lb 5  oz (92.7 kg)   SpO2 98%   BMI 37.37 kg/m   Wt Readings from Last 3 Encounters:  10/17/18 204 lb 5 oz (92.7 kg)  07/02/18 203 lb 7 oz (92.3 kg)  03/18/18 200 lb 12 oz (91.1 kg)    Physical Exam Vitals signs and nursing note reviewed.  Constitutional:      General: She is not in acute distress.    Appearance: She is well-developed.  HENT:     Head: Normocephalic and atraumatic.     Right Ear: External ear normal.     Left Ear: External ear normal.     Nose: Nose normal.     Mouth/Throat:     Pharynx: No oropharyngeal exudate.  Eyes:     General: No scleral icterus.    Conjunctiva/sclera: Conjunctivae normal.     Pupils: Pupils are equal, round, and reactive to light.  Neck:     Musculoskeletal: Normal range of motion and neck supple.  Cardiovascular:     Rate and Rhythm: Normal rate and regular rhythm.     Heart sounds: Normal heart sounds. No murmur.  Pulmonary:     Effort: Pulmonary effort is normal. No respiratory distress.     Breath sounds: Normal breath sounds. No wheezing or rales.  Musculoskeletal:     Comments: See HPI for foot exam if done  Lymphadenopathy:     Cervical: No cervical adenopathy.  Skin:    General: Skin is warm and dry.     Findings: No rash.       Results for orders placed or performed in visit on 10/17/18  POCT glycosylated hemoglobin (Hb A1C)  Result Value Ref Range   Hemoglobin A1C 8.6 (A) 4.0 - 5.6 %   HbA1c POC (<> result, manual entry)     HbA1c, POC (prediabetic range)     HbA1c, POC (controlled diabetic range)     Lab Results  Component Value Date   TSH 5.40 (H) 07/02/2018    Assessment & Plan:   Problem List Items Addressed This Visit    Severe obesity (BMI 35.0-39.9) with comorbidity ([place])    Encouraged ongoing efforts at weight loss. Discussed healthier snack choices      Relevant Medications   sitaGLIPtin (JANUVIA) 25 MG tablet   Essential hypertension    Chronic, stable. Continue current regimen.       Diabetes  mellitus type 2, uncontrolled, with complications ([place]) - Primary    Chronic, unchanged but above goal. rec price out [place]. If unaffordable, consider restarting actos. Unsure why this was discontinued. She declines DSME again today.      Relevant Medications   sitaGLIPtin (JANUVIA) 25 MG  tablet   Other Relevant Orders   POCT glycosylated hemoglobin (Hb A1C) (Completed)       Meds ordered this encounter  Medications  . sitaGLIPtin (JANUVIA) 25 MG tablet    Sig: Take 1 tablet (25 mg total) by mouth daily.    Dispense:  30 tablet    Refill:  6    Price out, or let me know if any more affordable DPP4 inhibitor   Orders Placed This Encounter  Procedures  . POCT glycosylated hemoglobin (Hb A1C)    Follow up plan: Return in about 3 months (around 01/17/2019) for follow up visit.  [name], MD

## 2018-10-17 NOTE — Assessment & Plan Note (Signed)
Chronic, stable. Continue current regimen. 

## 2018-10-17 NOTE — Patient Instructions (Signed)
You are doing well today, but sugar was too high.  Price out [place] 25mg  to take daily in addition to lantus insulin shots. Let me know if cost too high - then would likely restart actos.  Good to see you today, return in 3 months

## 2018-10-17 NOTE — Assessment & Plan Note (Signed)
Encouraged ongoing efforts at weight loss. Discussed healthier snack choices

## 2018-10-30 ENCOUNTER — Other Ambulatory Visit: Payer: Self-pay | Admitting: *Deleted

## 2018-10-30 NOTE — Patient Outreach (Signed)
[facility]) Care Management  10/30/2018  Kristen [name] [birth year] [id number]   RN Health Coach received  telephone call from patient.  Hipaa compliance verified.patient was calling to make [place] aware she received her masks. Per patient she had also went for her follow up appointment 10/17/2018. Her A1C is 8.6. This is the same as her last check. No improvement. The Dr would like to add Januvia to her insulin , but per patient she can't afford this. RN referred to Pharmacy for assistance. RN reiterated that that patient knows the appropriate foods on her diet. RN discussed adhering to it.   Plan: Referral to pharmacy RN discussed adhering to diet RN discussed patient A1C  [place] Management [phone]

## 2018-10-31 ENCOUNTER — Other Ambulatory Visit: Payer: Self-pay | Admitting: Pharmacist

## 2018-10-31 NOTE — Patient Outreach (Addendum)
Kristen [name]) Kristen [name]   10/31/2018  Kristen [name] Jan 15, 1941 [id number]  Reason for referral: Medication Assistance with Januvia  Referral source: [place] Current insurance: [place] has assisted patient in the past with medication adherence   PMHx includes but not limited to:  Severe obesity, HTN, HLD, T2DM, hx DVT/PE, OA, CAD, hx CVA, gout, seizures, renal insufficiency  Outreach:  Successful telephone call with Kristen [name].  HIPAA identifiers verified.   Subjective:  Patient reports she is having difficulty affording a new medication, Januvia, that her doctor wants to her start on due to uncontrolled diabetes.  She states she also has trouble paying for her insulin.     Objective: The ASCVD Risk score Kristen [name]) failed to calculate for the following reasons:   The patient has a prior MI or stroke diagnosis  Lab Results  Component Value Date   CREATININE 1.15 07/02/2018   CREATININE 1.43 (H) 03/12/2018   CREATININE 0.93 02/20/2018    Lab Results  Component Value Date   HGBA1C 8.6 (A) 10/17/2018    Lipid Panel     Component Value Date/Time   CHOL 128 03/12/2018 0934   CHOL 138 07/03/2013 0442   TRIG 76.0 03/12/2018 0934   TRIG 77 07/03/2013 0442   HDL 39.30 03/12/2018 0934   HDL 41 07/03/2013 0442   CHOLHDL 3 03/12/2018 0934   VLDL 15.2 03/12/2018 0934   VLDL 15 07/03/2013 0442   LDLCALC 74 03/12/2018 0934   LDLCALC 82 07/03/2013 0442   LDLDIRECT 102.0 12/31/2015 0941    BP Readings from Last 3 Encounters:  10/17/18 132/68  07/02/18 130/74  03/18/18 134/78    Allergies  Allergen Reactions  . Peanut-Containing Drug Products Itching  . Acetaminophen Other (See Comments)    "Causes me to spit up blood"  . Aleve [Naproxen Sodium] Other (See Comments)    Spits up blood  . Doxycycline Other (See Comments)    Malaise, GI upset, "felt drunk" and very ill   . Metformin And Related Other (See Comments)    Chills, dizziness  . Penicillins Rash    Has patient had a PCN reaction causing immediate rash, facial/tongue/throat swelling, SOB or lightheadedness with hypotension: Yes Has patient had a PCN reaction causing severe rash involving mucus membranes or skin necrosis: No Has patient had a PCN reaction that required hospitalization No Has patient had a PCN reaction occurring within the last 10 years: No If all of the above answers are "NO", then may proceed with Cephalosporin use.     Medications Reviewed Today    Reviewed by [name], MD (Physician) on 10/17/18 at [phone]  Med List Status: <None>  Medication Order Taking? Sig Documenting Provider Last Dose Status Informant  amLODipine (NORVASC) 5 MG tablet [id number] Yes Take 1 tablet (5 mg total) by mouth daily. [name], MD Taking Active   atorvastatin (LIPITOR) 40 MG tablet [id number] Yes Take 1 tablet (40 mg total) by mouth daily. [name], MD Taking Active   blood glucose meter kit and supplies KIT [id number] Yes Dispense based on patient and insurance preference. Use up to four times daily as directed. (FOR ICD-9 250.00, 250.01). Elby Beck, FNP Taking Active Pharmacy Records  Cholecalciferol (VITAMIN D) 2000 units CAPS [id number] Yes Take 1 capsule by mouth daily. [provider] Taking Active Pharmacy Records  Med Note Gentry Roch   Wed Feb 20, 2018 11:50 AM)    clindamycin (CLEOCIN) 150 MG capsule [id number] Yes Take 2 capsules (300 mg total) by mouth 2 (two) times daily. Jean Rosenthal, MD Taking Active   clopidogrel (PLAVIX) 75 MG tablet [id number] Yes Take 1 tablet (75 mg total) by mouth daily. [name], MD Taking Active   clotrimazole (LOTRIMIN AF) 1 % cream [id number] Yes Apply 1 application topically 2 (two) times daily. Rash under breast  Patient taking differently: Apply 1 application topically as needed. Rash under breast    [name], MD Taking Active Pharmacy Records           Med Note Gentry Roch   Wed Feb 20, 2018 11:50 AM)    Colchicine (MITIGARE) 0.6 MG CAPS [id number] Yes Take 1 tablet by mouth daily as needed (for gout). [name], MD Taking Active Pharmacy Records           Med Note Gentry Roch   Wed Feb 20, 2018  2:04 PM)    glucose blood (ONE TOUCH ULTRA TEST) test strip [id number] Yes USE UP TO 4 TIMES DAILY AS DIRECTED Dx Code E11.8 E11.65 [name], MD Taking Active Pharmacy Records  hydrALAZINE (APRESOLINE) 50 MG tablet [id number] Yes TAKE 1 TABLET BY MOUTH TWICE A [name], MD Taking Active   ibuprofen (ADVIL,MOTRIN) 400 MG tablet [id number] Yes Take 1 tablet (400 mg total) by mouth every 6 (six) hours as needed. Jean Rosenthal, MD Taking Active   Insulin Glargine (LANTUS SOLOSTAR) 100 UNIT/ML Solostar Pen [id number] Yes Inject 18 Units into the skin at bedtime. Qs 3 mo [name], MD Taking Active   Insulin Pen Needle (B-D UF III MINI PEN NEEDLES) 31G X 5 MM MISC [id number] Yes Inject 1 each as directed at bedtime. Use as directed to inject insulin.  Dx code: E11.8, E11.65 [name], MD Taking Active   losartan-hydrochlorothiazide [name]) 100-12.5 MG tablet [id number] Yes Take 1 tablet by mouth daily. [name], MD Taking Active   metoprolol tartrate (LOPRESSOR) 25 MG tablet [id number] Yes Take 12.5 mg by mouth 2 (two) times daily. [provider] Taking Active Pharmacy Records  nitroGLYCERIN (NITROSTAT) 0.4 MG SL tablet [id number] Yes Place 0.4 mg under the tongue every 5 (five) minutes as needed for chest pain. [provider] Taking Active Pharmacy Records           Med Note Knox Royalty   Tue Jan 15, 2018 12:05 PM) Has not needed recently   [place] [id number] Yes USE U PTO 4 TIMES DAILY AS DIRECTED [name], MD Taking Active Pharmacy Records  potassium chloride (K-DUR) 10 MEQ  tablet [id number] Yes Take 1 tablet (10 mEq total) by mouth every Monday, Wednesday, and Friday. [name], MD Taking Active           Assessment: Drugs sorted by system:  Hematologic: clopidogrel  Cardiovascular: amlodipine, atorvastatin, hydralazine, losartan-HCTZ, metoprolol tartrate, NTG SL  Endocrine:insulin glargine, sitagliptin  Topical: clotrimazole  Pain:ibuprofen  Vitamins/Minerals/Supplements: vitamin D, potassium  Miscellaneous: colchicine  Medication Review Findings:  . Metoprolol tartrate: Taking full tablet 38m BID rather than 1/2 tablet 12.567mBID.  Patient reports pill is too small for her to be able to split it.   o Lowest dose available is 2544m Patient could be switched to Metoprolol succinate 70m65mce daily instead if pharmacy unable to split pills for her.  . Potassium:  Initially reports taking  daily, reviewed instructions for M/W/F and patient then stated she was taking it this way instead . Ibuprofen: only taking PRN, monitor use carefully with renal insufficiency . Losartan-HCTZ: per notes, patient overdue for refill in June, she reports she has picked up medication in the last few weeks  Medication Assistance Findings:  Medication assistance needs identified: Januvia, Lantus  Extra Help:  May be eligible for Partial Extra Help Low Income Subsidy based on reported income and assets  Patient Assistance Programs: Januvia made by [employer] o Income requirement met: Yes o Out-of-pocket prescription expenditure met:   Not Applicable - Patient has met application requirements to apply for this patient assistance program.   - Reviewed program requirements with patient.    Lantus made by [employer] o Income requirement met: Yes o Out-of-pocket prescription expenditure met:   Unknown (2% household income) - Alternative option is to apply for a similar product in the same therapeutic class which does not have a required out of pocket expenditure.  A  possible substitution is  Engineer, agricultural  made by [employer] .   - Call placed to pharmacy in attempt to find out TROOP however was on hold for > 15 minutes, had to disconnect.   Plan: . Will contact PCP re: metoprolol  . Assisted patient with applying for LIS - will f/u in 1 month . I will route patient assistance letter to [place] technician who will coordinate patient assistance program application process for medications listed above.  [facility] pharmacy technician will assist with obtaining all required documents from both patient and provider(s) and submit application(s) once completed.     Ralene Bathe, PharmD, [place] [phone]

## 2018-11-01 ENCOUNTER — Other Ambulatory Visit: Payer: Self-pay | Admitting: Pharmacy Technician

## 2018-11-01 NOTE — Patient Outreach (Signed)
[place] [facility]) Care Management  11/01/2018  Kristen [name] February 18, 1941 [id number]                          Medication Assistance Referral  Referral From: [place] / [employer]  Patient application portion:  Education officer, museum portion: Interoffice Mailed to Dr. [name]   Medication/Company: Nancee Liter / Ralph Leyden Cares Patient application portion:  Mailed Provider application portion: Interoffice Mailed to Dr. [name]   Follow up:  Will follow up with patient in 7-10 business days to confirm application(s) have been received.  [name] [place] Certified Pharmacy Technician [place] Management Direct Dial:[phone]

## 2018-11-05 ENCOUNTER — Telehealth: Payer: Self-pay | Admitting: Family Medicine

## 2018-11-05 NOTE — Telephone Encounter (Signed)
Received pt assistance application from [facility] for januvia and basaglar (in place of lantus). Filled and will route back inter office mail.

## 2018-11-14 ENCOUNTER — Other Ambulatory Visit: Payer: Self-pay | Admitting: Pharmacy Technician

## 2018-11-14 NOTE — Patient Outreach (Signed)
[place] [facility]) Care Management  11/14/2018  Shekira Drummer [place] 1940-09-10 [id number]    Successful call placed to patient regarding patient assistance application(s) for [name] , HIPAA identifiers verified. Ms. Mccullum confirms that she received patient assistance applications and that she will put them in the mail to send back tomorrow.  Follow up:  Will submit applications to companies once documents have been received.  [name] [place] Certified Pharmacy Technician [place] Management Direct [phone]

## 2018-11-20 ENCOUNTER — Other Ambulatory Visit: Payer: Self-pay | Admitting: Pharmacy Technician

## 2018-11-20 NOTE — Patient Outreach (Signed)
[facility]) Care Management  11/20/2018  Camre[place] Michie Granito 01-19-1941 [id number]   Received patient portion(s) of patient assistance application for [employer]. Prepared to mail completed application and required documents into [employer] for [employer]. Faxed completed application and required documents into [employer] for [employer]  Will follow up with company in 3-5 [employer]), 10-14 ([employer]) business days to check status of application.  [name] [place] Certified Pharmacy Technician [place] Management Direct Dial:[phone]

## 2018-11-22 ENCOUNTER — Other Ambulatory Visit: Payer: Self-pay | Admitting: Pharmacy Technician

## 2018-11-22 NOTE — Patient Outreach (Signed)
[place] [facility]) Care Management  11/22/2018  Christyna Letendre Andalon Jun 18, 1940 [id number]    Follow up call placed to [facility] regarding patient assistance application(s) for Fredrich Romans confirms patient has been approved as of 7/30 until 04/24/19. Faxed script portion of application into [employer].  Follow up:  Will follow up with [employer] to check status of medication shipment  [employer]. Chana Bode [place] Certified Pharmacy Technician [place] Management Direct Dial:[phone]

## 2018-11-26 ENCOUNTER — Other Ambulatory Visit: Payer: Self-pay | Admitting: Pharmacy Technician

## 2018-11-26 NOTE — Patient Outreach (Signed)
[place] [facility]) Care Management  11/26/2018  Kristen [name] 28-Feb-1941 [id number]    Follow up call placed to patient regarding patient assistance shipping details for Basaglar, [name] confirms that she placed order for medication to be shipped. Medication to arrive at patients home on 8/10.  Follow up:  Will follow up with patient in 4-5 business days to confirm medication has been received.  [name] [place] Certified Pharmacy Technician [place] Management Direct Dial:[phone]

## 2018-12-02 ENCOUNTER — Ambulatory Visit: Payer: Self-pay | Admitting: Pharmacist

## 2018-12-03 ENCOUNTER — Ambulatory Visit: Payer: Self-pay | Admitting: Pharmacist

## 2018-12-03 ENCOUNTER — Other Ambulatory Visit: Payer: Self-pay | Admitting: Pharmacist

## 2018-12-03 NOTE — Patient Outreach (Addendum)
[place] [facility]) Care Management  [place]  12/03/2018  Kristen [name] [birth year] [id number]  Reason for call: f/u on Extra Help application, delivery of [place] and [place] program application still in process  Outreach:  Unsuccessful telephone call attempt #1 to patient.   Unable to leave message  Plan:  -I will make another outreach attempt to patient within 3-4 business days.    Ralene Bathe, PharmD, [place] [phone]  Addendum: Incoming call from patient.  She received Basaglar insulin pens last Friday and has them stored in her fridge.  She will use up her current insulin supply before she starts using them.  We reviewed that she will have to continue buying insulin pen needles from her regular pharmacy.    Patient reports she has not received any LIS letter or attestation letter yet from [employer].  She is aware to call myself or [place] when these letters arrives.    Will f/u again with patient in 2-3 weeks if I have not heard back re: LIS  Ralene Bathe, PharmD, [place] [phone]

## 2018-12-04 ENCOUNTER — Other Ambulatory Visit: Payer: Self-pay | Admitting: Pharmacy Technician

## 2018-12-04 NOTE — Patient Outreach (Signed)
[place]) Care Management  12/04/2018  Kristen [name] 1941-03-06 [id number]    Follow up call placed to [employer]  regarding patient assistance application(s) for Kristen [name] verified application was received. She states attestation form was mailed out to patient on 8/6.   Successful call placed to patient regarding patient assistance receipt of attestation form from [place] for [employer], HIPAA identifiers verified. Kristen [name] states that she has not received the attestation form as of yet. Requested that she contact me when she receives it so that I may assist her with completing form.  Kristen [name] confirms that she received her [place] from [employer] patient assistance but is not going to start the medication until she runs out of the Lantus she is currently using.   Follow up:  Will follow up with patient in 2-3 business days if call has not been returned about attestation form.  Kristen [name] [place] Certified Pharmacy Technician [place] Management Direct Dial:[phone]

## 2018-12-06 ENCOUNTER — Ambulatory Visit: Payer: Self-pay | Admitting: Pharmacist

## 2018-12-10 ENCOUNTER — Other Ambulatory Visit: Payer: Self-pay | Admitting: Pharmacy Technician

## 2018-12-10 NOTE — Patient Outreach (Signed)
[place] [facility]) Care Management  12/10/2018  Melani Brisbane Pigue 10-11-1940 [id number]    Return call placed to patient regarding patient assistance receipt of attestation form from [place] for [place] identifiers verified. Assisted Ms. Milbrath with filling out attestation form.  Follow up:  Will follow up with company in 7-10 business days to check application status.  [name] [place] Certified Pharmacy Technician [place] Management Direct [phone]

## 2018-12-18 ENCOUNTER — Other Ambulatory Visit: Payer: Self-pay | Admitting: Pharmacy Technician

## 2018-12-18 NOTE — Patient Outreach (Signed)
[place] [facility]) Care Management  12/18/2018  Kristen [name] [birth year] [id number]    Follow up call placed to [employer] regarding patient assistance application(s) for Kristen [name] confirms that patient has been approved as of 8/24 until 04/24/19. Medication to arrive at patients home in 10-14 business days.  Follow up:  Will follow up with patient in 14-20 business days to confirm medication has been received.  [name] [place] Certified Pharmacy Technician [place] Management Direct Dial:[phone]

## 2018-12-23 MED ORDER — METOPROLOL SUCCINATE ER 25 MG PO TB24: 25.0000 mg | ORAL_TABLET | Freq: Every day | ORAL | 3 refills | Status: DC

## 2018-12-23 NOTE — Telephone Encounter (Signed)
Received request from pharmacy to simplify med regimen and switch metoprolol to succinate. plz notify pt I'd like her to take metoprolol succinate (toprol XL) 25mg  once daily in place of her twice daily metoprolol tartrate (12.5mg ).

## 2018-12-23 NOTE — Telephone Encounter (Signed)
Spoke with pt relaying Dr. Synthia Innocent instructions.  Pt verbalizes understanding and will pick up new rx today.

## 2018-12-23 NOTE — Addendum Note (Signed)
Addended by: [name] on: 12/23/2018 10:56 AM   Modules accepted: Orders

## 2018-12-24 ENCOUNTER — Other Ambulatory Visit: Payer: Self-pay | Admitting: Pharmacist

## 2018-12-24 ENCOUNTER — Ambulatory Visit: Payer: Self-pay | Admitting: Pharmacist

## 2018-12-24 NOTE — Patient Outreach (Signed)
[place] [facility]) Care Management  [place] 12/24/2018  [place] [birth year] [id number]  Reason for call: f/u on Extra Help application, delivery of Januvia  Outreach:  Unsuccessful telephone call attempt #1 to patient. HIPAA compliant voicemail left requesting a return call  Plan:  -I will make another outreach attempt to patient within 3-4 business days.    Ralene Bathe, PharmD, [place] [phone]

## 2018-12-26 ENCOUNTER — Other Ambulatory Visit: Payer: Self-pay | Admitting: *Deleted

## 2018-12-26 NOTE — Patient Outreach (Signed)
[place] [facility]) Care Management  12/26/2018   Kristen [name] 07-14-1940 [id number]  RN Health Coach attempted follow up outreach call to patient.  Patient was unavailable. HIPPA compliance voicemail message left with return callback number.  Plan: RN will call patient again within 30 days.  [place] Care Management [phone]

## 2018-12-27 ENCOUNTER — Ambulatory Visit: Payer: Self-pay | Admitting: Pharmacist

## 2018-12-27 ENCOUNTER — Other Ambulatory Visit: Payer: Self-pay | Admitting: Pharmacist

## 2018-12-27 NOTE — Patient Outreach (Signed)
[facility]) Care Management  [place] 12/27/2018  [place] Jun 20, 1940 [id number]  Reason for call: f/u on Extra Help application, delivery of Januvia  Outreach:  Unsuccessful telephone call attempt #2 to patient.  No voicemail prompt to leave message.   Plan:  -I will make another outreach attempt to patient within 3-4 business days.    Ralene Bathe, PharmD, [place] [phone]

## 2019-01-02 ENCOUNTER — Ambulatory Visit: Payer: Self-pay | Admitting: Pharmacist

## 2019-01-02 ENCOUNTER — Other Ambulatory Visit: Payer: Self-pay | Admitting: Pharmacist

## 2019-01-02 NOTE — Patient Outreach (Signed)
[place] [facility]) Care Management  [place] 01/02/2019  Taiyah Ginther Sanderfer May 05, 1940 [id number]  Reason for call: f/u on Extra help, Januvia   Successful call with Ms. Cardon.  Reviewed with patient that she was approved for Januvia patient assistance program. Patient has not received Januvia yet.  It should arrive in the next week or so.  Patient will contact [place] once medication arrives.  She also states she has not received Extra Help letter but will watch for it. [facility] pharmacy technician will f/u with patient regarding receipt of Januvia.   Ralene Bathe, PharmD, [place] [phone]

## 2019-01-14 ENCOUNTER — Encounter: Payer: Self-pay | Admitting: Family Medicine

## 2019-01-14 ENCOUNTER — Ambulatory Visit (INDEPENDENT_AMBULATORY_CARE_PROVIDER_SITE_OTHER): Payer: Medicare Other | Admitting: Family Medicine

## 2019-01-14 ENCOUNTER — Other Ambulatory Visit: Payer: Self-pay

## 2019-01-14 VITALS — BP 136/80 | HR 62 | Temp 97.6°F | Ht 62.0 in | Wt 206.2 lb

## 2019-01-14 DIAGNOSIS — I10 Essential (primary) hypertension: Secondary | ICD-10-CM

## 2019-01-14 DIAGNOSIS — Z8673 Personal history of transient ischemic attack (TIA), and cerebral infarction without residual deficits: Secondary | ICD-10-CM | POA: Diagnosis not present

## 2019-01-14 DIAGNOSIS — L602 Onychogryphosis: Secondary | ICD-10-CM | POA: Diagnosis not present

## 2019-01-14 DIAGNOSIS — E118 Type 2 diabetes mellitus with unspecified complications: Secondary | ICD-10-CM

## 2019-01-14 DIAGNOSIS — E1165 Type 2 diabetes mellitus with hyperglycemia: Secondary | ICD-10-CM

## 2019-01-14 DIAGNOSIS — R3915 Urgency of urination: Secondary | ICD-10-CM

## 2019-01-14 DIAGNOSIS — IMO0002 Reserved for concepts with insufficient information to code with codable children: Secondary | ICD-10-CM

## 2019-01-14 LAB — POC URINALSYSI DIPSTICK (AUTOMATED)
Bilirubin, UA: NEGATIVE
Blood, UA: NEGATIVE
Glucose, UA: NEGATIVE
Ketones, UA: NEGATIVE
Leukocytes, UA: NEGATIVE
Nitrite, UA: NEGATIVE
Protein, UA: POSITIVE — AB
Spec Grav, UA: 1.025 (ref 1.010–1.025)
Urobilinogen, UA: 0.2 E.U./dL
pH, UA: 5.5 (ref 5.0–8.0)

## 2019-01-14 NOTE — Progress Notes (Signed)
This visit was conducted in person.  BP 136/80 (BP Location: Left Arm, Patient Position: Sitting, Cuff Size: Large)    Pulse 62    Temp 97.6 F (36.4 C) (Temporal)    Ht _0  (1.575 m)    Wt 206 lb 4 oz (93.6 kg)    SpO2 99%    BMI 37.72 kg/m    CC: 3 mo DM f/u visit Subjective:    Patient ID: Kristen [name], female    DOB: 1941/03/28, 78 y.o.   MRN: [id number]  HPI: Kristen [name] is a 78 y.o. female presenting on 01/14/2019 for Follow-up (Here for 3 mo f/u.) and Urinary Urgency (C/o strong sensation to urinate but only urinates a little at a time. Sxs started 3-4 mos ago, worsened.  Wants to discuss urology referral. )   DM - does regularly check sugars: fasting 120-130, evening 170-200. Compliant with antihyperglycemic regimen which includes: basaglar 18 u daily, januvia 28m daily - has not started yet - to get this week. Denies low sugars or hypoglycemic symptoms. Denies paresthesias. Last diabetic eye exam 09/2018. Pneumovax: 2016. Prevnar: 2015. Glucometer brand: one-touch. DSME: declined. Declines flu shot. Requests podiatry referral for foot exam.  Lab Results  Component Value Date   HGBA1C 8.6 (A) 10/17/2018   Diabetic Foot Exam - Simple   Simple Foot Form Diabetic Foot exam was performed with the following findings: Yes 01/14/2019  9:43 AM  Visual Inspection See comments: Yes Sensation Testing Intact to touch and monofilament testing bilaterally: Yes Pulse Check Posterior Tibialis and Dorsalis pulse intact bilaterally: Yes Comments Thickened onychomycotic R great toenail    Lab Results  Component Value Date   MICROALBUR 7.8 (H) 08/09/2016         Relevant past medical, surgical, family and social history reviewed and updated as indicated. Interim medical history since our last visit reviewed. Allergies and medications reviewed and updated. Outpatient Medications Prior to Visit  Medication Sig Dispense Refill   amLODipine (NORVASC) 5 MG tablet Take 1 tablet (5  mg total) by mouth daily. 90 tablet 3   atorvastatin (LIPITOR) 40 MG tablet Take 1 tablet (40 mg total) by mouth daily. 90 tablet 3   blood glucose meter kit and supplies KIT Dispense based on patient and insurance preference. Use up to four times daily as directed. (FOR ICD-9 250.00, 250.01). 1 each 0   Cholecalciferol (VITAMIN D) 2000 units CAPS Take 1 capsule by mouth daily.     clopidogrel (PLAVIX) 75 MG tablet Take 1 tablet (75 mg total) by mouth daily. 90 tablet 3   clotrimazole (LOTRIMIN AF) 1 % cream Apply 1 application topically 2 (two) times daily. Rash under breast (Patient taking differently: Apply 1 application topically as needed. Rash under breast) 45 g 1   Colchicine (MITIGARE) 0.6 MG CAPS Take 1 tablet by mouth daily as needed (for gout). 30 capsule 3   glucose blood (ONE TOUCH ULTRA TEST) test strip USE UP TO 4 TIMES DAILY AS DIRECTED Dx Code E11.8 E11.65 100 each 11   hydrALAZINE (APRESOLINE) 50 MG tablet TAKE 1 TABLET BY MOUTH TWICE A DAY 180 tablet 1   ibuprofen (ADVIL,MOTRIN) 400 MG tablet Take 1 tablet (400 mg total) by mouth every 6 (six) hours as needed. 30 tablet 0   Insulin Glargine (BASAGLAR KWIKPEN) 100 UNIT/ML SOPN Inject 0.18 mLs (18 Units total) into the skin daily.     Insulin Pen Needle (B-D UF III MINI PEN NEEDLES) 31G X 5  MM MISC Inject 1 each as directed at bedtime. Use as directed to inject insulin.  Dx code: E11.8, E11.65 100 each 1   losartan-hydrochlorothiazide (HYZAAR) 100-12.5 MG tablet Take 1 tablet by mouth daily. 90 tablet 3   metoprolol succinate (TOPROL-XL) 25 MG 24 hr tablet Take 1 tablet (25 mg total) by mouth daily. 90 tablet 3   nitroGLYCERIN (NITROSTAT) 0.4 MG SL tablet Place 0.4 mg under the tongue every 5 (five) minutes as needed for chest pain.     ONETOUCH DELICA LANCETS FINE MISC USE U PTO 4 TIMES DAILY AS DIRECTED 100 each 2   potassium chloride (K-DUR) 10 MEQ tablet Take 1 tablet (10 mEq total) by mouth every Monday,  Wednesday, and Friday. 40 tablet 3   sitaGLIPtin (JANUVIA) 25 MG tablet Take 1 tablet (25 mg total) by mouth daily. 30 tablet 6   No facility-administered medications prior to visit.      Per HPI unless specifically indicated in ROS section below Review of Systems Objective:    BP 136/80 (BP Location: Left Arm, Patient Position: Sitting, Cuff Size: Large)    Pulse 62    Temp 97.6 F (36.4 C) (Temporal)    Ht _0  (1.575 m)    Wt 206 lb 4 oz (93.6 kg)    SpO2 99%    BMI 37.72 kg/m   Wt Readings from Last 3 Encounters:  01/14/19 206 lb 4 oz (93.6 kg)  10/17/18 204 lb 5 oz (92.7 kg)  07/02/18 203 lb 7 oz (92.3 kg)    Physical Exam Vitals signs and nursing note reviewed.  Constitutional:      General: She is not in acute distress.    Appearance: She is well-developed.  HENT:     Head: Normocephalic and atraumatic.     Right Ear: External ear normal.     Left Ear: External ear normal.     Nose: Nose normal.     Mouth/Throat:     Pharynx: No oropharyngeal exudate.  Eyes:     General: No scleral icterus.    Conjunctiva/sclera: Conjunctivae normal.     Pupils: Pupils are equal, round, and reactive to light.  Neck:     Musculoskeletal: Normal range of motion and neck supple.  Cardiovascular:     Rate and Rhythm: Normal rate and regular rhythm.     Heart sounds: Normal heart sounds. No murmur.  Pulmonary:     Effort: Pulmonary effort is normal. No respiratory distress.     Breath sounds: Normal breath sounds. No wheezing or rales.  Musculoskeletal:     Comments: See HPI for foot exam if done  Lymphadenopathy:     Cervical: No cervical adenopathy.  Skin:    General: Skin is warm and dry.     Findings: No rash.       Results for orders placed or performed in visit on 01/14/19  POCT Urinalysis Dipstick (Automated)  Result Value Ref Range   Color, UA yellow    Clarity, UA clear    Glucose, UA Negative Negative   Bilirubin, UA negative    Ketones, UA negative    Spec  Grav, UA 1.025 1.010 - 1.025   Blood, UA negative    pH, UA 5.5 5.0 - 8.0   Protein, UA Positive (A) Negative   Urobilinogen, UA 0.2 0.2 or 1.0 E.U./dL   Nitrite, UA negative    Leukocytes, UA Negative Negative   Lab Results  Component Value Date   CREATININE  1.15 07/02/2018   BUN 22 07/02/2018   NA 139 07/02/2018   K 3.7 07/02/2018   CL 101 07/02/2018   CO2 31 07/02/2018    Assessment & Plan:   Problem List Items Addressed This Visit    Urinary urgency    UA with protein but no signs of infection.       Relevant Orders   POCT Urinalysis Dipstick (Automated) (Completed)   History of CVA (cerebrovascular accident)    Continues plavix daily.       Essential hypertension    Chronic, stable. Continue current regimen.       Diabetes mellitus type 2, uncontrolled, with complications ([place]) - Primary    Chronic, anticipate unchanged but stable based on recall cbg's. She will start [place] this week. Continues basaglar. Too early for A1c today. Update labs next visit (at CPE 02/2019). Encouraged healthy diet choices.       Relevant Orders   Ambulatory referral to Podiatry    Other Visit Diagnoses    Thickened nail       Relevant Orders   Ambulatory referral to Podiatry       No orders of the defined types were placed in this encounter.  Orders Placed This Encounter  Procedures   Ambulatory referral to Podiatry    Referral Priority:   Routine    Referral Type:   Consultation    Referral Reason:   Specialty Services Required    Requested Specialty:   Podiatry    Number of Visits Requested:   1   POCT Urinalysis Dipstick (Automated)    Patient Instructions  You are doing well today Try januvia, let us know if any trouble tolerating this medicine.  We will refer you to podiatrist (foot doctor).  Return for physical (already scheduled).     Follow up plan: Return for annual exam, prior fasting for blood work.  [name], MD

## 2019-01-14 NOTE — Assessment & Plan Note (Signed)
UA with protein but no signs of infection.

## 2019-01-14 NOTE — Patient Instructions (Addendum)
You are doing well today Try januvia, let us know if any trouble tolerating this medicine.  We will refer you to podiatrist (foot doctor).  Return for physical (already scheduled).

## 2019-01-14 NOTE — Assessment & Plan Note (Signed)
Chronic, anticipate unchanged but stable based on recall cbg's. She will start [place] this week. Continues basaglar. Too early for A1c today. Update labs next visit (at CPE 02/2019). Encouraged healthy diet choices.

## 2019-01-14 NOTE — Assessment & Plan Note (Signed)
Chronic, stable. Continue current regimen. 

## 2019-01-14 NOTE — Assessment & Plan Note (Signed)
Continues plavix daily.

## 2019-01-15 ENCOUNTER — Other Ambulatory Visit: Payer: Self-pay | Admitting: Pharmacy Technician

## 2019-01-15 NOTE — Patient Outreach (Signed)
[facility]) Care Management  01/15/2019  Reiko Wiese Tweed 03/05/1941 [id number]    Follow up call placed to [facility]) [facility]acy)  regarding patient assistance shipping details for Berneice Gandy states that clarification is needed for the prescription in order for them to ship medication to patient.   Sent in-basket message to Dr. [name] requesting a 3 month script for Januvia be faxed into [facility] @ [phone]  Maud Deed. Chana Bode [place] Certified Pharmacy Technician [place] Management Direct Dial:[phone]

## 2019-01-16 ENCOUNTER — Telehealth: Payer: Self-pay | Admitting: Family Medicine

## 2019-01-16 NOTE — Telephone Encounter (Addendum)
plz fax as per below Rx printed and in Lisa's box.

## 2019-01-16 NOTE — Telephone Encounter (Signed)
-----   Message from Adaline Sill, CPhT sent at 01/15/2019  9:33 AM EDT ----- Regarding: Kristen [name] Prescription Good Morning!  Patient has been approved thru [employer] patient assistance to get Januvia however [employer] is requesting a clarified script for the medication. Will you please fax in a 3 month script for Januvia to [facility] @ [phone].  Thank you so much!!  Maud Deed. Chana Bode [place] Certified Pharmacy Technician [place] Management Direct Dial:[phone]

## 2019-01-17 MED ORDER — SITAGLIPTIN PHOSPHATE 25 MG PO TABS: 25.0000 mg | ORAL_TABLET | Freq: Every day | ORAL | 3 refills | Status: DC

## 2019-01-17 NOTE — Telephone Encounter (Signed)
RX faxed

## 2019-02-03 ENCOUNTER — Other Ambulatory Visit: Payer: Self-pay | Admitting: Pharmacy Technician

## 2019-02-03 NOTE — Patient Outreach (Signed)
[facility]) Care Management  02/03/2019  Kristen [name] 1940/08/10 [id number]    Incoming call from patient regarding patient assistance medication delivery of Januvia, HIPAA identifiers verified. [name] confirms that she received 90 day supply of medication from [employer] on 10/8. She confirms not having any additional questions at this time.  Follow up:  Will route note to [place] for case closure  Maud Deed. Chana Bode [place] Certified Pharmacy Technician [place] Management Direct Dial:[phone]

## 2019-02-06 ENCOUNTER — Other Ambulatory Visit: Payer: Self-pay | Admitting: Pharmacist

## 2019-02-06 NOTE — Patient Outreach (Signed)
[facility]) Care Management [place]  02/06/2019  Kristen [name] Jan 19, 1941 [id number]  Patient has been approved for [employer] patient assistance program(s) and medications have arrived to patient's home. Patient has been instructed on how to order refills and renewal process for 2020.    Plan: [place] case is being closed due to the following reasons: -Goals of care have been met. -Thank you for allowing [facility] pharmacy to be involved in this patient's care.    Ralene Bathe, PharmD, [place] [phone]

## 2019-02-10 ENCOUNTER — Encounter: Payer: Self-pay | Admitting: Podiatry

## 2019-02-10 ENCOUNTER — Other Ambulatory Visit: Payer: Self-pay

## 2019-02-10 ENCOUNTER — Ambulatory Visit: Payer: Medicare Other | Admitting: Podiatry

## 2019-02-10 DIAGNOSIS — B351 Tinea unguium: Secondary | ICD-10-CM

## 2019-02-10 DIAGNOSIS — M79675 Pain in left toe(s): Secondary | ICD-10-CM

## 2019-02-10 DIAGNOSIS — M79674 Pain in right toe(s): Secondary | ICD-10-CM | POA: Diagnosis not present

## 2019-02-10 DIAGNOSIS — E1159 Type 2 diabetes mellitus with other circulatory complications: Secondary | ICD-10-CM | POA: Diagnosis not present

## 2019-02-10 NOTE — Progress Notes (Signed)
This patient presents to the office with chief complaint of long thick big  Nails both feet  and diabetic feet.  This patient  says there  is  no pain and discomfort in her  feet.  This patient says there are long thick painful nails.  These big  nails are painful walking and wearing shoes.  Patient has no history of infection or drainage from both feet.  Patient is unable to  self treat his own nails . This patient presents  to the office today for treatment of the  long nails and a foot evaluation due to history of  diabetes.  General Appearance  Alert, conversant and in no acute stress.  Vascular  Dorsalis pedis  Are weakly palpable  bilaterally. Posterior tibial pulses are not palpable  B/L. Capillary return is diminished  bilaterally. Temperature is within normal limits  bilaterally.  Neurologic  Senn-Weinstein monofilament wire test within normal limits  bilaterally. Muscle power within normal limits bilaterally.  Nails Thick disfigured discolored nails with subungual debris  Hallux nails  B/L. No evidence of bacterial infection or drainage bilaterally.  Orthopedic  No limitations of motion of motion feet .  No crepitus or effusions noted.  No bony pathology or digital deformities noted.  Skin  normotropic skin with no porokeratosis noted bilaterally.  No signs of infections or ulcers noted.     Onychomycosis  Diabetes with vascular disease  IE  Debride nails x 2.  A diabetic foot exam was performed and there is no evidence of  neurologic pathology.   Diminished vascular findings noted. RTC prn   Gardiner Barefoot DPM

## 2019-02-12 ENCOUNTER — Other Ambulatory Visit: Payer: Self-pay

## 2019-02-12 NOTE — Patient Outreach (Signed)
[facility]) Care Management  02/12/2019  Kristen [name] 02-Nov-1940 [id number]   Medication Adherence call to Kristen [name] Telephone call to Patient regarding Medication Adherence unable to reach patient.Patient did not answer patient is past due on Losartan/Hctz 100/12.5 mg under [place].   [place] Management Direct Dial [phone]  Fax [phone] [email or web address]

## 2019-02-26 ENCOUNTER — Telehealth: Payer: Self-pay | Admitting: Family Medicine

## 2019-02-26 ENCOUNTER — Other Ambulatory Visit: Payer: Self-pay

## 2019-02-26 NOTE — Patient Outreach (Signed)
[facility]) Care Management  02/26/2019  Kristen [name] 09/28/1940 [id number]   Medication Adherence call to Mrs. Kristen [name] spoke with patient she is past due on Losartan/Hctz 100/12.5 mg,patient explain she is not sure if she is supposed to be taking this medication,left a message at doctor office they will call back and will call patient to let her know. Kristen [name] is showing past due under [place].   [place] Management Direct Dial [phone]  Fax [phone] [email or web address]

## 2019-02-26 NOTE — Telephone Encounter (Signed)
Vicente Males @ [facility] called she has questions on pt medications  Is pt to be taking Losartan HCT 100/12.5 If pt is not to take this meds please call pt   Please advise Vicente Males

## 2019-02-27 NOTE — Telephone Encounter (Signed)
Left message for Vicente Males to call back to be advised

## 2019-02-27 NOTE — Telephone Encounter (Signed)
Patient does have losartan HCT 100/12.5mg  on her med list so should be taking.

## 2019-02-28 NOTE — Telephone Encounter (Signed)
Spoke with pt confirming she is taking losartan-HCTZ 100-12.5 mg along with amlodipine and metoprolol XL daily.

## 2019-03-03 ENCOUNTER — Other Ambulatory Visit: Payer: Self-pay | Admitting: *Deleted

## 2019-03-04 NOTE — Patient Outreach (Signed)
[facility] ([facility]) Care Management  [facility] Care Manager  03/03/2019  Damaria L Mah 03/23/1941 [id number]  RN Health Coach telephone call to patient.  [employer] compliance verified. Per patient she is doing good. She is adhering to her diet better and hoping to see a decrease in her A1C on 03/24/2019 next blood draw. A1C is 8.6 now. Patient fasting blood sugar is 119. Patient has now purchased a car and is driving self. Patient does not use a cane to ambulate. Patient has not had any recent falls. Per patient she has cut back on fried foods and drinks. Patient has agreed to further outreach calls.    Encounter Medications:  Outpatient Encounter Medications as of 03/03/2019  Medication Sig Note  . amLODipine (NORVASC) 5 MG tablet Take 1 tablet (5 mg total) by mouth daily.   . atorvastatin (LIPITOR) 40 MG tablet Take 1 tablet (40 mg total) by mouth daily.   . blood glucose meter kit and supplies KIT Dispense based on patient and insurance preference. Use up to four times daily as directed. (FOR ICD-9 250.00, 250.01).   . Cholecalciferol (VITAMIN D) 2000 units CAPS Take 1 capsule by mouth daily.   . clopidogrel (PLAVIX) 75 MG tablet Take 1 tablet (75 mg total) by mouth daily.   . clotrimazole (LOTRIMIN AF) 1 % cream Apply 1 application topically 2 (two) times daily. Rash under breast (Patient taking differently: Apply 1 application topically as needed. Rash under breast)   . Colchicine (MITIGARE) 0.6 MG CAPS Take 1 tablet by mouth daily as needed (for gout).   . glucose blood (ONE TOUCH ULTRA TEST) test strip USE UP TO 4 TIMES DAILY AS DIRECTED Dx Code E11.8 E11.65   . hydrALAZINE (APRESOLINE) 50 MG tablet TAKE 1 TABLET BY MOUTH TWICE A DAY   . ibuprofen (ADVIL,MOTRIN) 400 MG tablet Take 1 tablet (400 mg total) by mouth every 6 (six) hours as needed.   . Insulin Glargine (BASAGLAR KWIKPEN) 100 UNIT/ML SOPN Inject 0.18 mLs (18 Units total) into the skin daily.   . Insulin Pen Needle (B-D  UF III MINI PEN NEEDLES) 31G X 5 MM MISC Inject 1 each as directed at bedtime. Use as directed to inject insulin.  Dx code: E11.8, E11.65   . losartan-hydrochlorothiazide (HYZAAR) 100-12.5 MG tablet Take 1 tablet by mouth daily.   . metoprolol succinate (TOPROL-XL) 25 MG 24 hr tablet Take 1 tablet (25 mg total) by mouth daily.   . nitroGLYCERIN (NITROSTAT) 0.4 MG SL tablet Place 0.4 mg under the tongue every 5 (five) minutes as needed for chest pain. 01/15/2018: Has not needed recently   . ONETOUCH DELICA LANCETS FINE MISC USE U PTO 4 TIMES DAILY AS DIRECTED   . potassium chloride (K-DUR) 10 MEQ tablet Take 1 tablet (10 mEq total) by mouth every Monday, Wednesday, and Friday.   . sitaGLIPtin (JANUVIA) 25 MG tablet Take 1 tablet (25 mg total) by mouth daily.    No facility-administered encounter medications on file as of 03/03/2019.     Functional Status:  In your present state of health, do you have any difficulty performing the following activities: 03/03/2019 09/27/2018  Hearing? N N  Vision? Y Y  Comment - -  Difficulty concentrating or making decisions? N N  Walking or climbing stairs? N N  Dressing or bathing? N N  Doing errands, shopping? N Y  Comment patient has bought a car and has transportation Patient does not have transportation  Preparing Food and eating ?   N N  Using the Toilet? N N  In the past six months, have you accidently leaked urine? N N  Do you have problems with loss of bowel control? N N  Managing your Medications? N N  Managing your Finances? N N  Housekeeping or managing your Housekeeping? N N  Some recent data might be hidden    Fall/Depression Screening: Fall Risk  03/03/2019 09/27/2018 07/29/2018  Falls in the past year? 1 1 1  Comment - - -  Number falls in past yr: 1 1 1  Comment - - -  Injury with Fall? 0 0 0  Risk Factor Category  - - -  Risk for fall due to : History of fall(s);Impaired balance/gait;Impaired mobility History of fall(s) History of  fall(s);Impaired balance/gait;Impaired mobility  Risk for fall due to: Comment - - -  Follow up Falls evaluation completed;Falls prevention discussed;Education provided Falls evaluation completed;Falls prevention discussed Falls evaluation completed;Falls prevention discussed   PHQ 2/9 Scores 03/03/2019 09/27/2018 07/29/2018 05/27/2018 03/12/2018 03/06/2018 01/15/2018  PHQ - 2 Score 0 0 0 0 0 0 0  PHQ- 9 Score - - - - 0 - -   [facility] CM Care Plan Problem One     Most Recent Value  Care Plan Problem One  knowledge Deficit in self management of Diabetes  Role Documenting the Problem One  Health Coach  Care Plan for Problem One  Active  [facility] Long Term Goal   Patient will continue to see a decrease in A1C from 8.6 withn the next 90 days  [facility] Long Term Goal Start Date  01/31/19  Interventions for Problem One Long Term Goal  RN discussed what the patient A1C is now 8.6. and what changes she did to help it come back down. Patient next draw is 03/24/2019. RN will follow up with further discussion.  [facility] CM Short Term Goal #1   Patient will be able to verbalize healthy snack choices within the next 30 days  [facility] CM Short Term Goal #1 Met Date  03/03/19  [facility] CM Short Term Goal #2   Patient will be able to verbalize foods to choose of of dine out menus within the next 30 days  [facility] CM Short Term Goal #2 Met Date  03/03/19  [facility] CM Short Term Goal #3  Patient will verbalize eating healthy foods and maintaing diet through the Holidays withn the next 30 days  [facility] CM Short Term Goal #3 Start Date  03/03/19  Interventions for Short Tern Goal #3  RN discussed the Holiday eating and adhering to her diet. RN will follow up with monitoring of the A1c and further discussion.      Assessment:  A1C 8.6 FBS 119 No recent falls Ambulating without cane Patient is now driving and has transportation Patient will continue to  benefit from Health Coach telephonic outreach for education and support for diabetes self  management.  Plan:  RN discussed the importance of dietary adherence during Holiday eating RN discussed Health maintenance RN discussed A1C goal of 7 RN will follow up outreach call on March 24 2019 for A1C results    BSN RN [facility] Care Management [phone]   

## 2019-03-10 ENCOUNTER — Ambulatory Visit: Payer: Self-pay | Admitting: *Deleted

## 2019-03-10 NOTE — Telephone Encounter (Signed)
Pt already has appt scheduled with Dr Darnell Level on 03/11/19 at 3:45

## 2019-03-10 NOTE — Telephone Encounter (Signed)
Pt called in c/o right groin pain that goes 1/2 way down her thigh.   When the pain hits it stops her in her tracks.   It comes an goes.   This started yesterday morning.  I warm transferred the call into the [facility] office to [place] to be scheduled.    I sent my triage notes to the office.  Reason for Disposition . Age > 60 years  Answer Assessment - Initial Assessment Questions 1. LOCATION: "Where does it hurt?"      It hurts in my groin  at my right  leg and it hurts real bad.   2. RADIATION: "Does the pain shoot anywhere else?" (e.g., chest, back)     The pain goes about 1/2 down thigh. 3. ONSET: "When did the pain begin?" (e.g., minutes, hours or days ago)      Yesterday morning I got up and then laid back down.   It then hit me all at once.    I got up and started walking around.   It would hit me all of a sudden.     4. SUDDEN: "Gradual or sudden onset?"     Suddenly      I've had 2 strokes and a blockage  In my right leg.   I had a blood clot break  Off and go to my lung 3-4 years ago. 5. PATTERN "Does the pain come and go, or is it constant?"    - If constant: "Is it getting better, staying the same, or worsening?"      (Note: Constant means the pain never goes away completely; most serious pain is constant and it progresses)     - If intermittent: "How long does it last?" "Do you have pain now?"     (Note: Intermittent means the pain goes away completely between bouts)     It comes and goes 6. SEVERITY: "How bad is the pain?"  (e.g., Scale 1-10; mild, moderate, or severe)   - MILD (1-3): doesn't interfere with normal activities, abdomen soft and not tender to touch    - MODERATE (4-7): interferes with normal activities or awakens from sleep, tender to touch    - SEVERE (8-10): excruciating pain, doubled over, unable to do any normal activities     " It don't pain all the time"  "Maybe a 9 when it happens".   "It stops me in my tracks".  7. RECURRENT SYMPTOM:  "Have you ever had this type of abdominal pain before?" If so, ask: "When was the last time?" and "What happened that time?"      No 8. CAUSE: "What do you think is causing the abdominal pain?"     Maybe a blood clot.    9. RELIEVING/AGGRAVATING FACTORS: "What makes it better or worse?" (e.g., movement, antacids, bowel movement)     When I press on the painful area in right groin it stops but then comes back. 10. OTHER SYMPTOMS: "Has there been any vomiting, diarrhea, constipation, or urine problems?"       Denies vomiting & diarrhea.   No constipation or urinary problems. 11. PREGNANCY: "Is there any chance you are pregnant?" "When was your last menstrual period?"       N/A due to age  Protocols used: ABDOMINAL PAIN - [facility]

## 2019-03-10 NOTE — Telephone Encounter (Signed)
Will see then. 

## 2019-03-11 ENCOUNTER — Other Ambulatory Visit: Payer: Self-pay

## 2019-03-11 ENCOUNTER — Ambulatory Visit (INDEPENDENT_AMBULATORY_CARE_PROVIDER_SITE_OTHER)
Admission: RE | Admit: 2019-03-11 | Discharge: 2019-03-11 | Disposition: A | Discharge status: Home / Self Care | Payer: Medicare Other | Source: Ambulatory Visit | Attending: Family Medicine | Admitting: Family Medicine

## 2019-03-11 ENCOUNTER — Ambulatory Visit (INDEPENDENT_AMBULATORY_CARE_PROVIDER_SITE_OTHER): Payer: Medicare Other | Admitting: Family Medicine

## 2019-03-11 ENCOUNTER — Encounter: Payer: Self-pay | Admitting: Family Medicine

## 2019-03-11 VITALS — BP 130/82 | HR 62 | Temp 97.9°F | Ht 62.0 in | Wt 207.0 lb

## 2019-03-11 DIAGNOSIS — M25551 Pain in right hip: Secondary | ICD-10-CM

## 2019-03-11 DIAGNOSIS — M169 Osteoarthritis of hip, unspecified: Secondary | ICD-10-CM | POA: Insufficient documentation

## 2019-03-11 DIAGNOSIS — R3915 Urgency of urination: Secondary | ICD-10-CM

## 2019-03-11 DIAGNOSIS — M1611 Unilateral primary osteoarthritis, right hip: Secondary | ICD-10-CM

## 2019-03-11 LAB — POC URINALSYSI DIPSTICK (AUTOMATED)
Bilirubin, UA: NEGATIVE
Blood, UA: NEGATIVE
Glucose, UA: NEGATIVE
Ketones, UA: NEGATIVE
Leukocytes, UA: NEGATIVE
Nitrite, UA: NEGATIVE
Protein, UA: NEGATIVE
Spec Grav, UA: 1.015 (ref 1.010–1.025)
Urobilinogen, UA: 0.2 E.U./dL
pH, UA: 6 (ref 5.0–8.0)

## 2019-03-11 NOTE — Assessment & Plan Note (Signed)
Endorses worsening symptoms - update UA today.

## 2019-03-11 NOTE — Patient Instructions (Addendum)
I do think you have hip arthritis causing this groin pain  Start regular tylenol 500mg  every night for hip pain. You can increase to twice daily every day.  Urinalysis today.  Hip Pain  The hip is the joint between the upper legs and the lower pelvis. The bones, cartilage, tendons, and muscles of your hip joint support your body and allow you to move around. Hip pain can range from a minor ache to severe pain in one or both of your hips. The pain may be felt on the inside of the hip joint near the groin, or the outside near the buttocks and upper thigh. You may also have swelling or stiffness. Follow these instructions at home: Managing pain, stiffness, and swelling  If directed, apply ice to the injured area. ? Put ice in a plastic bag. ? Place a towel between your skin and the bag. ? Leave the ice on for 20 minutes, 2-3 times a day  Sleep with a pillow between your legs on your most comfortable side.  Avoid any activities that cause pain. General instructions  Take over-the-counter and prescription medicines only as told by your health care provider.  Do any exercises as told by your health care provider.  Record the following: ? How often you have hip pain. ? The location of your pain. ? What the pain feels like. ? What makes the pain worse.  Keep all follow-up visits as told by your health care provider. This is important. Contact a health care provider if:  You cannot put weight on your leg.  Your pain or swelling continues or gets worse after one week.  It gets harder to walk.  You have a fever. Get help right away if:  You fall.  You have a sudden increase in pain and swelling in your hip.  Your hip is red or swollen or very tender to touch. Summary  Hip pain can range from a minor ache to severe pain in one or both of your hips.  The pain may be felt on the inside of the hip joint near the groin, or the outside near the buttocks and upper thigh.  Avoid any  activities that cause pain.  Record how often you have hip pain, the location of the pain, what makes it worse and what it feels like. This information is not intended to replace advice given to you by your health care provider. Make sure you discuss any questions you have with your health care provider. Document Released: 09/28/2009 Document Revised: 03/23/2017 Document Reviewed: 03/13/2016 Elsevier Patient Education  2020 [employer].

## 2019-03-11 NOTE — Progress Notes (Signed)
This visit was conducted in person.  BP 130/82 (BP Location: Left Arm, Patient Position: Sitting, Cuff Size: Large)   Pulse 62   Temp 97.9 F (36.6 C) (Temporal)   Ht 5' 2" (1.575 m)   Wt 207 lb (93.9 kg)   SpO2 97%   BMI 37.86 kg/m    CC: R groin pain Subjective:    Patient ID: Kristen [name], female    DOB: 1940-06-10, 78 y.o.   MRN: [id number]  HPI: Kristen [name] is a 78 y.o. female presenting on 03/11/2019 for Groin Pain (C/o right groin pain radiating leg to knee.  Started about 1 mo ago, worsened in last few days.  Thinks it may be related to Januvia or Basaglar. )   1 mo h/o anterior R groin pain with radiation down leg to mid thigh. This morning and some L groin pain as well. Denies inciting trauma/falls. No back pain. Denies groin rash or skin changes. Pain may have started after she started [place].   Treating with tylenol 545m PRN with some benefit.  Noticing more trouble with urinary incontinence.      Relevant past medical, surgical, family and social history reviewed and updated as indicated. Interim medical history since our last visit reviewed. Allergies and medications reviewed and updated. Outpatient Medications Prior to Visit  Medication Sig Dispense Refill  . amLODipine (NORVASC) 5 MG tablet Take 1 tablet (5 mg total) by mouth daily. 90 tablet 3  . atorvastatin (LIPITOR) 40 MG tablet Take 1 tablet (40 mg total) by mouth daily. 90 tablet 3  . blood glucose meter kit and supplies KIT Dispense based on patient and insurance preference. Use up to four times daily as directed. (FOR ICD-9 250.00, 250.01). 1 each 0  . Cholecalciferol (VITAMIN D) 2000 units CAPS Take 1 capsule by mouth daily.    . clopidogrel (PLAVIX) 75 MG tablet Take 1 tablet (75 mg total) by mouth daily. 90 tablet 3  . clotrimazole (LOTRIMIN AF) 1 % cream Apply 1 application topically 2 (two) times daily. Rash under breast (Patient taking differently: Apply 1 application topically as needed.  Rash under breast) 45 g 1  . Colchicine (MITIGARE) 0.6 MG CAPS Take 1 tablet by mouth daily as needed (for gout). 30 capsule 3  . glucose blood (ONE TOUCH ULTRA TEST) test strip USE UP TO 4 TIMES DAILY AS DIRECTED Dx Code E11.8 E11.65 100 each 11  . hydrALAZINE (APRESOLINE) 50 MG tablet TAKE 1 TABLET BY MOUTH TWICE A DAY 180 tablet 1  . ibuprofen (ADVIL,MOTRIN) 400 MG tablet Take 1 tablet (400 mg total) by mouth every 6 (six) hours as needed. 30 tablet 0  . Insulin Glargine (BASAGLAR KWIKPEN) 100 UNIT/ML SOPN Inject 0.18 mLs (18 Units total) into the skin daily.    . Insulin Pen Needle (B-D UF III MINI PEN NEEDLES) 31G X 5 MM MISC Inject 1 each as directed at bedtime. Use as directed to inject insulin.  Dx code: E11.8, E11.65 100 each 1  . losartan-hydrochlorothiazide (HYZAAR) 100-12.5 MG tablet Take 1 tablet by mouth daily. 90 tablet 3  . metoprolol succinate (TOPROL-XL) 25 MG 24 hr tablet Take 1 tablet (25 mg total) by mouth daily. 90 tablet 3  . nitroGLYCERIN (NITROSTAT) 0.4 MG SL tablet Place 0.4 mg under the tongue every 5 (five) minutes as needed for chest pain.    .Glory RosebushDELICA LANCETS FINE MISC USE U PTO 4 TIMES DAILY AS DIRECTED 100 each 2  .  potassium chloride (K-DUR) 10 MEQ tablet Take 1 tablet (10 mEq total) by mouth every Monday, Wednesday, and Friday. 40 tablet 3  . sitaGLIPtin (JANUVIA) 25 MG tablet Take 1 tablet (25 mg total) by mouth daily. 90 tablet 3   No facility-administered medications prior to visit.      Per HPI unless specifically indicated in ROS section below Review of Systems Objective:    BP 130/82 (BP Location: Left Arm, Patient Position: Sitting, Cuff Size: Large)   Pulse 62   Temp 97.9 F (36.6 C) (Temporal)   Ht 5' 2" (1.575 m)   Wt 207 lb (93.9 kg)   SpO2 97%   BMI 37.86 kg/m   Wt Readings from Last 3 Encounters:  03/11/19 207 lb (93.9 kg)  01/14/19 206 lb 4 oz (93.6 kg)  10/17/18 204 lb 5 oz (92.7 kg)    Physical Exam Vitals signs and nursing  note reviewed.  Constitutional:      General: She is not in acute distress.    Appearance: Normal appearance. She is obese. She is not ill-appearing.  Musculoskeletal: Normal range of motion.     Right lower leg: No edema.     Left lower leg: No edema.     Comments:  Neg SLR bilaterally. ++ pain with int/ext rotation at R>L hip. No pain at SIJ, GTB or sciatic notch bilaterally.   Skin:    General: Skin is warm and dry.  Neurological:     Mental Status: She is alert.       Results for orders placed or performed in visit on 03/11/19  POCT Urinalysis Dipstick (Automated)  Result Value Ref Range   Color, UA yellow    Clarity, UA clear    Glucose, UA Negative Negative   Bilirubin, UA negative    Ketones, UA negative    Spec Grav, UA 1.015 1.010 - 1.025   Blood, UA negative    pH, UA 6.0 5.0 - 8.0   Protein, UA Negative Negative   Urobilinogen, UA 0.2 0.2 or 1.0 E.U./dL   Nitrite, UA negative    Leukocytes, UA Negative Negative   Lab Results  Component Value Date   HGBA1C 8.6 (A) 10/17/2018    Lab Results  Component Value Date   CREATININE 1.15 07/02/2018   BUN 22 07/02/2018   NA 139 07/02/2018   K 3.7 07/02/2018   CL 101 07/02/2018   CO2 31 07/02/2018    Assessment & Plan:   Problem List Items Addressed This Visit    Urinary urgency    Endorses worsening symptoms - update UA today.      Relevant Orders   POCT Urinalysis Dipstick (Automated) (Completed)   Hip osteoarthritis - Primary    R>L hip osteoarthritis progressively worsening. xrays today with evidence of degenerative joint disease - will await ortho eval. Schedule tylenol 580m BID for pain control. Discussed ortho referral to consider hip steroid injection or further intervention - she declines at this time. Will reassess in 2 wks at upcoming CPE.        Other Visit Diagnoses    Right hip pain       Relevant Orders   DG Hip Unilat W OR W/O Pelvis 2-3 Views Right       No orders of the defined  types were placed in this encounter.  Orders Placed This Encounter  Procedures  . DG Hip Unilat W OR W/O Pelvis 2-3 Views Right    Standing Status:  Future    Number of Occurrences:   1    Standing Expiration Date:   05/10/2020    Order Specific Question:   Reason for Exam (SYMPTOM  OR DIAGNOSIS REQUIRED)    Answer:   R groin pain eval hip arthritis    Order Specific Question:   Preferred imaging location?    Answer:   [facility]    Order Specific Question:   Radiology Contrast Protocol - do NOT remove file path    Answer:   _0 [email or web address]  . POCT Urinalysis Dipstick (Automated)   Patient instructions: I do think you have hip arthritis causing this groin pain  Start regular tylenol 587m every night for hip pain. You can increase to twice daily every day.  Urinalysis today.   Follow up plan: Return if symptoms worsen or fail to improve.  JRia Bush MD

## 2019-03-11 NOTE — Assessment & Plan Note (Signed)
R>L hip osteoarthritis progressively worsening. xrays today with evidence of degenerative joint disease - will await ortho eval. Schedule tylenol 500mg  BID for pain control. Discussed ortho referral to consider hip steroid injection or further intervention - she declines at this time. Will reassess in 2 wks at upcoming CPE.

## 2019-03-14 ENCOUNTER — Ambulatory Visit (INDEPENDENT_AMBULATORY_CARE_PROVIDER_SITE_OTHER): Payer: Medicare Other

## 2019-03-14 DIAGNOSIS — Z0000 Encounter for general adult medical examination without abnormal findings: Secondary | ICD-10-CM | POA: Diagnosis not present

## 2019-03-14 NOTE — Progress Notes (Signed)
PCP notes:  Health Maintenance: Declined flu vaccine   Abnormal Screenings: none   Patient concerns: none   Nurse concerns: none   Next PCP appt.: 03/24/2019 @ 9:30 am

## 2019-03-14 NOTE — Progress Notes (Signed)
Subjective:   Kristen [name] is a 78 y.o. female who presents for Medicare Annual (Subsequent) preventive examination.   Review of Systems: N/A   This visit is being conducted through telemedicine via telephone at the nurse health advisor's home address due to the COVID-19 pandemic. This patient has given me verbal consent via doximity to conduct this visit, patient states they are participating from their home address. Patient and myself are on the telephone call. There is no referral for this visit. Some vital signs may be absent or patient reported.    Patient identification: identified by name, DOB, and current address   Cardiac Risk Factors include: advanced age (>70mn, >>63women);diabetes mellitus;hypertension;dyslipidemia     Objective:     Vitals: There were no vitals taken for this visit.  There is no height or weight on file to calculate BMI.  Advanced Directives 03/14/2019 03/12/2018 10/31/2017 10/17/2017 09/28/2017 08/12/2017 09/02/2016  Does Patient Have a Medical Advance Directive? Yes Yes Yes No No Yes No  Type of AParamedicof [place] will [place] DNR (pink MOST or yellow form);Living will - - Living will -  Does patient want to make changes to medical advance directive? - - No - Patient declined - - No - Patient declined -  Copy of [place] Chart? Yes - validated most recent copy scanned in chart (See row information) No - copy requested - - - - -  Would patient like information on creating a medical advance directive? - - - Yes (MAU/Ambulatory/Procedural Areas - Information given) Yes (MAU/Ambulatory/Procedural Areas - Information given) - No - Patient declined    Tobacco Social History   Tobacco Use  Smoking Status Never Smoker  Smokeless Tobacco Never Used  Tobacco Comment   tobacco use- no      Counseling given: Not Answered  Comment: tobacco use- no    Clinical Intake:  Pre-visit preparation completed: Yes  Pain : No/denies pain     Nutritional Risks: None Diabetes: Yes CBG done?: No Did pt. bring in CBG monitor from home?: No  How often do you need to have someone help you when you read instructions, pamphlets, or other written materials from your doctor or pharmacy?: 1 - Never What is the last grade level you completed in school?: 11th  Interpreter Needed?: No  Information entered by :: CJohnson, LPN  Past Medical History:  Diagnosis Date  . Abdominal aortic atherosclerosis ([place] by [place] 02/2014  . [place] kidney injury ([place] 02/29/2016   During hospitalization 07/2017  . CAD (coronary artery disease)    by CT, per pt h/o MI  . Diabetes type 2, uncontrolled ([place]   . Frequent headaches   . GERD (gastroesophageal reflux disease)   . Gout   . History of pulmonary embolism 2012  . HLD (hyperlipidemia)   . HTN (hypertension)   . 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 Morbid obesity ([place]   . Myocardial infarction ([facility] 2012   per pt. report, [facility]   . Osteoarthritis    knees  . Primary localized osteoarthritis of left knee 09/22/2014  . Risk for falls 09/28/2017  . [place] apnea 2011   [place] since she [place] she doesn't use the CPAP any  longer & she doesn't have a problem with sleep apnea  . Stroke [facility])    still has balance problem on occas. , uses cane but that's mainly for the left knee pain  . Syncope 03/01/2016  . Thoracic aortic atherosclerosis ([place]) 12/2015   by CXR  . Vertigo    hx. benign postitional postural   Past Surgical History:  Procedure Laterality Date  . CATARACT EXTRACTION Bilateral 2013  . EYE SURGERY     /w IOL  . FOOT SURGERY Right   . PARTIAL HYSTERECTOMY     for fibroids, ovaries remain  .  TONSILLECTOMY    . TOTAL KNEE ARTHROPLASTY Right 1990s  . TOTAL KNEE ARTHROPLASTY Left 09/22/2014   [name], MD  . TUBAL LIGATION     Family History  Problem Relation Age of Onset  . Cancer Mother        bone  . Diabetes Father   . Hypertension Father   . Cancer Son 31       lung  . Congenital heart disease Son 48  . Stroke Brother    Social History   Socioeconomic History  . Marital status: Widowed    Spouse name: Not on file  . Number of children: Not on file  . Years of education: Not on file  . Highest education level: Not on file  Occupational History  . Not on file  Social Needs  . Financial resource strain: Not very hard  . Food insecurity    Worry: Never true    Inability: Never true  . Transportation needs    Medical: No    Non-medical: No  Tobacco Use  . Smoking status: Never Smoker  . Smokeless tobacco: Never Used  . Tobacco comment: tobacco use- no   Substance and Sexual Activity  . Alcohol use: Yes    Alcohol/week: 2.0 standard drinks    Types: 1 Glasses of wine, 1 Cans of beer per week    Comment: Rare  . Drug use: No  . Sexual activity: Not on file  Lifestyle  . Physical activity    Days per week: 0 days    Minutes per session: 0 min  . Stress: Not at all  Relationships  . Social Herbalist on phone: Not on file    Gets together: Not on file    Attends religious service: Not on file    Active member of club or organization: Not on file    Attends meetings of clubs or organizations: Not on file    Relationship status: Not on file  Other Topics Concern  . Not on file  Social History Narrative   Lives alone   4 grown children   Education: 11th grade   Occupation: retired, was Scientist, water quality   Activity: Does not regularly exercise.    Diet: good water, fruits/vegetables seldom   Transportation: Patient has been educated on her transportation benefit through [facility]    Outpatient Encounter Medications as of 03/14/2019   Medication Sig  . amLODipine (NORVASC) 5 MG tablet Take 1 tablet (5 mg total) by mouth daily.  Marland Kitchen atorvastatin (LIPITOR) 40 MG tablet Take 1 tablet (40 mg total) by mouth daily.  . blood glucose meter kit and supplies KIT Dispense based on patient and insurance preference. Use up to four times daily as directed. (FOR ICD-9 250.00, 250.01).  . Cholecalciferol (VITAMIN D) 2000 units CAPS Take 1 capsule by mouth daily.  . clopidogrel (PLAVIX)  75 MG tablet Take 1 tablet (75 mg total) by mouth daily.  . clotrimazole (LOTRIMIN AF) 1 % cream Apply 1 application topically 2 (two) times daily. Rash under breast (Patient taking differently: Apply 1 application topically as needed. Rash under breast)  . Colchicine (MITIGARE) 0.6 MG CAPS Take 1 tablet by mouth daily as needed (for gout).  Marland Kitchen glucose blood (ONE TOUCH ULTRA TEST) test strip USE UP TO 4 TIMES DAILY AS DIRECTED Dx Code E11.8 E11.65  . hydrALAZINE (APRESOLINE) 50 MG tablet TAKE 1 TABLET BY MOUTH TWICE A DAY  . ibuprofen (ADVIL,MOTRIN) 400 MG tablet Take 1 tablet (400 mg total) by mouth every 6 (six) hours as needed.  . Insulin Glargine (BASAGLAR KWIKPEN) 100 UNIT/ML SOPN Inject 0.18 mLs (18 Units total) into the skin daily.  . Insulin Pen Needle (B-D UF III MINI PEN NEEDLES) 31G X 5 MM MISC Inject 1 each as directed at bedtime. Use as directed to inject insulin.  Dx code: E11.8, E11.65  . losartan-hydrochlorothiazide (HYZAAR) 100-12.5 MG tablet Take 1 tablet by mouth daily.  . metoprolol succinate (TOPROL-XL) 25 MG 24 hr tablet Take 1 tablet (25 mg total) by mouth daily.  . nitroGLYCERIN (NITROSTAT) 0.4 MG SL tablet Place 0.4 mg under the tongue every 5 (five) minutes as needed for chest pain.  [name] DELICA LANCETS FINE MISC USE U PTO 4 TIMES DAILY AS DIRECTED  . potassium chloride (K-DUR) 10 MEQ tablet Take 1 tablet (10 mEq total) by mouth every Monday, Wednesday, and Friday.  . sitaGLIPtin (JANUVIA) 25 MG tablet Take 1 tablet (25 mg total) by  mouth daily.   No facility-administered encounter medications on file as of 03/14/2019.     Activities of Daily Living In your present state of health, do you have any difficulty performing the following activities: 03/14/2019 03/03/2019  Hearing? Y N  Comment left ear changes -  Vision? N Y  Comment - -  Difficulty concentrating or making decisions? N N  Walking or climbing stairs? N N  Dressing or bathing? N N  Doing errands, shopping? 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 N N  Using the Toilet? N N  In the past six months, have you accidently leaked urine? N N  Do you have problems with loss of bowel control? N N  Managing your Medications? N N  Managing your Finances? N N  Housekeeping or managing your Housekeeping? N N  Some recent data might be hidden    Patient Care Team: [name], MD as PCP - General (Family Medicine) Rockey Situ, Kathlene November, MD as Consulting Physician (Cardiology) Pleasant, Eppie Gibson, RN as [place] Management    Assessment:   This is a routine wellness examination for Saidy.  Exercise Activities and Dietary recommendations Current Exercise Habits: The patient does not participate in regular exercise at present, Exercise limited by: None identified  Goals    . chronic health management     Starting 03/12/2018, I will continue to take medications as prescribed to manage health conditions.    . Patient Stated     03/14/2019, I will maintain and continue medications as prescribed.        Fall Risk Fall Risk  03/14/2019 03/03/2019 09/27/2018 07/29/2018 05/27/2018  Falls in the past year? _0 Comment tripped on rug - - - -  Number falls in past yr: 0 1 1  1 1  Comment - - - - -  Injury with Fall? 0 0 0 0 0  Risk Factor Category  - - - - -  Risk for fall due to : Medication side effect History of fall(s);Impaired balance/gait;Impaired mobility History of fall(s)  History of fall(s);Impaired balance/gait;Impaired mobility History of fall(s);Impaired balance/gait;Impaired mobility  Risk for fall due to: Comment - - - - -  Follow up Falls evaluation completed;Falls prevention discussed Falls evaluation completed;Falls prevention discussed;Education provided Falls evaluation completed;Falls prevention discussed Falls evaluation completed;Falls prevention discussed Falls evaluation completed;Falls prevention discussed   Is the patient's home free of loose throw rugs in walkways, pet beds, electrical cords, etc?   yes      Grab bars in the bathroom? yes      Handrails on the stairs?   yes      Adequate lighting?   yes  Timed Get Up and Go performed: N/A  Depression Screen PHQ 2/9 Scores 03/14/2019 03/03/2019 09/27/2018 07/29/2018  PHQ - 2 Score 0 0 0 0  PHQ- 9 Score 0 - - -     Cognitive Function MMSE - Mini Mental State Exam 03/14/2019 03/12/2018 08/09/2016  Orientation to time _0 Orientation to Place _1 Registration _2 Attention/ Calculation 0 0 0  Recall _3 Language- name 2 objects - 0 0  Language- repeat _4 Language- follow 3 step command - 2 3  Language- follow 3 step command-comments - unable to follow 1 step of 3 step command -  Language- read & follow direction - 0 0  Write a sentence - 0 0  Copy design - 0 0  Total score - 19 20  Mini Cog  Mini-Cog screen was completed. Maximum score is 22. A value of 0 denotes this part of the MMSE was not completed or the patient failed this part of the Mini-Cog screening.       Immunization History  Administered Date(s) Administered  . PPD Test 09/25/2014  . Pneumococcal Conjugate-13 09/10/2013  . Pneumococcal Polysaccharide-23 03/10/2015  . Zoster 04/21/2014    Qualifies for Shingles Vaccine? Yes  Screening Tests Health Maintenance  Topic Date Due  . INFLUENZA VACCINE  07/23/2019 (Originally 11/23/2018)  . DTaP/Tdap/Td (1 - Tdap) 08/10/2026 (Originally 08/07/1959)  .  TETANUS/TDAP  08/10/2026 (Originally 08/07/1959)  . HEMOGLOBIN A1C  04/18/2019  . OPHTHALMOLOGY EXAM  10/15/2019  . FOOT EXAM  01/14/2020  . DEXA SCAN  Completed  . PNA vac Low Risk Adult  Completed    Cancer Screenings: Lung: Low Dose CT Chest recommended if Age 22-80 years, 30 pack-year currently smoking OR have quit w/in 15years. Patient does not qualify. Breast:  Up to date on Mammogram? Yes, completed 10/16/2018   Up to date of Bone Density/Dexa? Yes, completed 04/21/2015 Colorectal: FOBT completed 07/04/2018  Additional Screenings:  Hepatitis C Screening: N/A     Plan:   Patient will maintain her current status.  I have personally reviewed and noted the following in the patient's chart:   . Medical and social history . Use of alcohol, tobacco or illicit drugs  . Current medications and supplements . Functional ability and status . Nutritional status . Physical activity . Advanced directives . List of other physicians . Hospitalizations, surgeries, and ER visits in previous 12 months . Vitals . Screenings to include cognitive, depression, and falls . Referrals and appointments  In addition, I have reviewed and discussed  with patient certain preventive protocols, quality metrics, and best practice recommendations. A written personalized care plan for preventive services as well as general preventive health recommendations were provided to patient.     Andrez Grime, LPN  12/75/1700

## 2019-03-14 NOTE — Patient Instructions (Signed)
Kristen [name] , Thank you for taking time to come for your Medicare Wellness Visit. I appreciate your ongoing commitment to your health goals. Please review the following plan we discussed and let me know if I can assist you in the future.   Screening recommendations/referrals: Colonoscopy: FOBT completed 07/04/2018 Mammogram: Up to date, completed 10/16/2018 Bone Density: Up to date, completed 04/21/2015 Recommended yearly ophthalmology/optometry visit for glaucoma screening and checkup Recommended yearly dental visit for hygiene and checkup  Vaccinations: Influenza vaccine: decline Pneumococcal vaccine: Completed series Tdap vaccine: decline Shingles vaccine: decline    Advanced directives: copy in chart  Conditions/risks identified: diabetes, hypertension, hyperlipidemia  Next appointment: 03/24/2019 @ 9:30 am    Preventive Care 77 Years and Older, Female Preventive care refers to lifestyle choices and visits with your health care provider that can promote health and wellness. What does preventive care include?  A yearly physical exam. This is also called an annual well check.  Dental exams once or twice a year.  Routine eye exams. Ask your health care provider how often you should have your eyes checked.  Personal lifestyle choices, including:  Daily care of your teeth and gums.  Regular physical activity.  Eating a healthy diet.  Avoiding tobacco and drug use.  Limiting alcohol use.  Practicing safe sex.  Taking low-dose aspirin every day.  Taking vitamin and mineral supplements as recommended by your health care provider. What happens during an annual well check? The services and screenings done by your health care provider during your annual well check will depend on your age, overall health, lifestyle risk factors, and family history of disease. Counseling  Your health care provider may ask you questions about your:  Alcohol use.  Tobacco use.  Drug use.   Emotional well-being.  Home and relationship well-being.  Sexual activity.  Eating habits.  History of falls.  Memory and ability to understand (cognition).  Work and work Statistician.  Reproductive health. Screening  You may have the following tests or measurements:  Height, weight, and BMI.  Blood pressure.  Lipid and cholesterol levels. These may be checked every 5 years, or more frequently if you are over 72 years old.  Skin check.  Lung cancer screening. You may have this screening every year starting at age 25 if you have a 30-pack-year history of smoking and currently smoke or have quit within the past 15 years.  Fecal occult blood test (FOBT) of the stool. You may have this test every year starting at age 22.  Flexible sigmoidoscopy or colonoscopy. You may have a sigmoidoscopy every 5 years or a colonoscopy every 10 years starting at age 55.  Hepatitis C blood test.  Hepatitis B blood test.  Sexually transmitted disease (STD) testing.  Diabetes screening. This is done by checking your blood sugar (glucose) after you have not eaten for a while (fasting). You may have this done every 1-3 years.  Bone density scan. This is done to screen for osteoporosis. You may have this done starting at age 77.  Mammogram. This may be done every 1-2 years. Talk to your health care provider about how often you should have regular mammograms. Talk with your health care provider about your test results, treatment options, and if necessary, the need for more tests. Vaccines  Your health care provider may recommend certain vaccines, such as:  Influenza vaccine. This is recommended every year.  Tetanus, diphtheria, and acellular pertussis (Tdap, Td) vaccine. You may need a Td booster every  10 years.  Zoster vaccine. You may need this after age 74.  Pneumococcal 13-valent conjugate (PCV13) vaccine. One dose is recommended after age 51.  Pneumococcal polysaccharide (PPSV23)  vaccine. One dose is recommended after age 31. Talk to your health care provider about which screenings and vaccines you need and how often you need them. This information is not intended to replace advice given to you by your health care provider. Make sure you discuss any questions you have with your health care provider. Document Released: 05/07/2015 Document Revised: 12/29/2015 Document Reviewed: 02/09/2015 Elsevier Interactive Patient Education  2017 [place] Prevention in the Home Falls can cause injuries. They can happen to people of all ages. There are many things you can do to make your home safe and to help prevent falls. What can I do on the outside of my home?  Regularly fix the edges of walkways and driveways and fix any cracks.  Remove anything that might make you trip as you walk through a door, such as a raised step or threshold.  Trim any bushes or trees on the path to your home.  Use bright outdoor lighting.  Clear any walking paths of anything that might make someone trip, such as rocks or tools.  Regularly check to see if handrails are loose or broken. Make sure that both sides of any steps have handrails.  Any raised decks and porches should have guardrails on the edges.  Have any leaves, snow, or ice cleared regularly.  Use sand or salt on walking paths during winter.  Clean up any spills in your garage right away. This includes oil or grease spills. What can I do in the bathroom?  Use night lights.  Install grab bars by the toilet and in the tub and shower. Do not use towel bars as grab bars.  Use non-skid mats or decals in the tub or shower.  If you need to sit down in the shower, use a plastic, non-slip stool.  Keep the floor dry. Clean up any water that spills on the floor as soon as it happens.  Remove soap buildup in the tub or shower regularly.  Attach bath mats securely with double-sided non-slip rug tape.  Do not have throw rugs  and other things on the floor that can make you trip. What can I do in the bedroom?  Use night lights.  Make sure that you have a light by your bed that is easy to reach.  Do not use any sheets or blankets that are too big for your bed. They should not hang down onto the floor.  Have a firm chair that has side arms. You can use this for support while you get dressed.  Do not have throw rugs and other things on the floor that can make you trip. What can I do in the kitchen?  Clean up any spills right away.  Avoid walking on wet floors.  Keep items that you use a lot in easy-to-reach places.  If you need to reach something above you, use a strong step stool that has a grab bar.  Keep electrical cords out of the way.  Do not use floor polish or wax that makes floors slippery. If you must use wax, use non-skid floor wax.  Do not have throw rugs and other things on the floor that can make you trip. What can I do with my stairs?  Do not leave any items on the stairs.  Make sure  that there are handrails on both sides of the stairs and use them. Fix handrails that are broken or loose. Make sure that handrails are as long as the stairways.  Check any carpeting to make sure that it is firmly attached to the stairs. Fix any carpet that is loose or worn.  Avoid having throw rugs at the top or bottom of the stairs. If you do have throw rugs, attach them to the floor with carpet tape.  Make sure that you have a light switch at the top of the stairs and the bottom of the stairs. If you do not have them, ask someone to add them for you. What else can I do to help prevent falls?  Wear shoes that:  Do not have high heels.  Have rubber bottoms.  Are comfortable and fit you well.  Are closed at the toe. Do not wear sandals.  If you use a stepladder:  Make sure that it is fully opened. Do not climb a closed stepladder.  Make sure that both sides of the stepladder are locked into  place.  Ask someone to hold it for you, if possible.  Clearly mark and make sure that you can see:  Any grab bars or handrails.  First and last steps.  Where the edge of each step is.  Use tools that help you move around (mobility aids) if they are needed. These include:  Canes.  Walkers.  Scooters.  Crutches.  Turn on the lights when you go into a dark area. Replace any light bulbs as soon as they burn out.  Set up your furniture so you have a clear path. Avoid moving your furniture around.  If any of your floors are uneven, fix them.  If there are any pets around you, be aware of where they are.  Review your medicines with your doctor. Some medicines can make you feel dizzy. This can increase your chance of falling. Ask your doctor what other things that you can do to help prevent falls. This information is not intended to replace advice given to you by your health care provider. Make sure you discuss any questions you have with your health care provider. Document Released: 02/04/2009 Document Revised: 09/16/2015 Document Reviewed: 05/15/2014 Elsevier Interactive Patient Education  2017 [employer].

## 2019-03-17 ENCOUNTER — Ambulatory Visit: Payer: Medicare Other

## 2019-03-18 ENCOUNTER — Ambulatory Visit: Payer: Medicare Other

## 2019-03-24 ENCOUNTER — Encounter: Payer: Self-pay | Admitting: Family Medicine

## 2019-03-24 ENCOUNTER — Other Ambulatory Visit: Payer: Self-pay | Admitting: *Deleted

## 2019-03-24 ENCOUNTER — Ambulatory Visit (INDEPENDENT_AMBULATORY_CARE_PROVIDER_SITE_OTHER): Payer: Medicare Other | Admitting: Family Medicine

## 2019-03-24 ENCOUNTER — Other Ambulatory Visit: Payer: Self-pay

## 2019-03-24 VITALS — BP 150/74 | HR 76 | Temp 98.0°F | Ht 61.25 in | Wt 208.2 lb

## 2019-03-24 DIAGNOSIS — I2510 Atherosclerotic heart disease of native coronary artery without angina pectoris: Secondary | ICD-10-CM | POA: Diagnosis not present

## 2019-03-24 DIAGNOSIS — N289 Disorder of kidney and ureter, unspecified: Secondary | ICD-10-CM | POA: Diagnosis not present

## 2019-03-24 DIAGNOSIS — E039 Hypothyroidism, unspecified: Secondary | ICD-10-CM

## 2019-03-24 DIAGNOSIS — Z1211 Encounter for screening for malignant neoplasm of colon: Secondary | ICD-10-CM

## 2019-03-24 DIAGNOSIS — IMO0002 Reserved for concepts with insufficient information to code with codable children: Secondary | ICD-10-CM

## 2019-03-24 DIAGNOSIS — Z8673 Personal history of transient ischemic attack (TIA), and cerebral infarction without residual deficits: Secondary | ICD-10-CM

## 2019-03-24 DIAGNOSIS — I700 Atherosclerosis of aorta: Secondary | ICD-10-CM | POA: Diagnosis not present

## 2019-03-24 DIAGNOSIS — Z0000 Encounter for general adult medical examination without abnormal findings: Secondary | ICD-10-CM | POA: Diagnosis not present

## 2019-03-24 DIAGNOSIS — E118 Type 2 diabetes mellitus with unspecified complications: Secondary | ICD-10-CM

## 2019-03-24 DIAGNOSIS — I10 Essential (primary) hypertension: Secondary | ICD-10-CM

## 2019-03-24 DIAGNOSIS — M1A09X0 Idiopathic chronic gout, multiple sites, without tophus (tophi): Secondary | ICD-10-CM

## 2019-03-24 DIAGNOSIS — E1169 Type 2 diabetes mellitus with other specified complication: Secondary | ICD-10-CM | POA: Diagnosis not present

## 2019-03-24 DIAGNOSIS — E1159 Type 2 diabetes mellitus with other circulatory complications: Secondary | ICD-10-CM

## 2019-03-24 DIAGNOSIS — E785 Hyperlipidemia, unspecified: Secondary | ICD-10-CM

## 2019-03-24 DIAGNOSIS — M1611 Unilateral primary osteoarthritis, right hip: Secondary | ICD-10-CM

## 2019-03-24 DIAGNOSIS — E559 Vitamin D deficiency, unspecified: Secondary | ICD-10-CM | POA: Diagnosis not present

## 2019-03-24 DIAGNOSIS — E1165 Type 2 diabetes mellitus with hyperglycemia: Secondary | ICD-10-CM | POA: Diagnosis not present

## 2019-03-24 LAB — COMPREHENSIVE METABOLIC PANEL
ALT: 11 U/L (ref 0–35)
AST: 14 U/L (ref 0–37)
Albumin: 3.4 g/dL — ABNORMAL LOW (ref 3.5–5.2)
Alkaline Phosphatase: 92 U/L (ref 39–117)
BUN: 19 mg/dL (ref 6–23)
CO2: 31 mEq/L (ref 19–32)
Calcium: 9.3 mg/dL (ref 8.4–10.5)
Chloride: 101 mEq/L (ref 96–112)
Creatinine, Ser: 1.15 mg/dL (ref 0.40–1.20)
GFR: 55.27 mL/min — ABNORMAL LOW (ref >60.00)
Glucose, Bld: 155 mg/dL — ABNORMAL HIGH (ref 70–99)
Potassium: 4.1 mEq/L (ref 3.5–5.1)
Sodium: 139 mEq/L (ref 135–145)
Total Bilirubin: 0.5 mg/dL (ref 0.2–1.2)
Total Protein: 6.3 g/dL (ref 6.0–8.3)

## 2019-03-24 LAB — VITAMIN D 25 HYDROXY (VIT D DEFICIENCY, FRACTURES): VITD: 32.24 ng/mL (ref 30.00–100.00)

## 2019-03-24 LAB — LIPID PANEL
Cholesterol: 134 mg/dL (ref 0–200)
HDL: 40.5 mg/dL (ref >39.00)
LDL Cholesterol: 73 mg/dL (ref 0–99)
NonHDL: 93.2
Total CHOL/HDL Ratio: 3
Triglycerides: 100 mg/dL (ref 0.0–149.0)
VLDL: 20 mg/dL (ref 0.0–40.0)

## 2019-03-24 LAB — HEMOGLOBIN A1C: Hgb A1c MFr Bld: 8.2 % — ABNORMAL HIGH (ref 4.6–6.5)

## 2019-03-24 LAB — URIC ACID: Uric Acid, Serum: 7.2 mg/dL — ABNORMAL HIGH (ref 2.4–7.0)

## 2019-03-24 LAB — TSH: TSH: 8.2 u[IU]/mL — ABNORMAL HIGH (ref 0.35–4.50)

## 2019-03-24 LAB — T4, FREE: Free T4: 1.04 ng/dL (ref 0.60–1.60)

## 2019-03-24 NOTE — Assessment & Plan Note (Signed)
Preventative protocols reviewed and updated unless pt declined. Discussed healthy diet and lifestyle.  

## 2019-03-24 NOTE — Assessment & Plan Note (Signed)
Continue plavix °

## 2019-03-24 NOTE — Assessment & Plan Note (Signed)
On statin, plavix.

## 2019-03-24 NOTE — Assessment & Plan Note (Signed)
R hip pain has improved. Discussed PRN tylenol and voltaren gel.

## 2019-03-24 NOTE — Patient Outreach (Signed)
[facility]) Care Management  [place]  03/24/2019   Kristen [name] [birth year] [id number]  [place] telephone call to patient.  [place] compliance verified. Per patient she is feeling good. Patient had A1C drawn : A1C is decreased from 8.6 to 8.2. Patient admitted not adhering to diet during her holidays. Patient has been driving self to all appointments. Patient is not using a cane. Patient has agreed to follow up outreach calls.   Encounter Medications:  Outpatient Encounter Medications as of 03/24/2019  Medication Sig Note  . amLODipine (NORVASC) 5 MG tablet Take 1 tablet (5 mg total) by mouth daily.   Marland Kitchen atorvastatin (LIPITOR) 40 MG tablet Take 1 tablet (40 mg total) by mouth daily.   . blood glucose meter kit and supplies KIT Dispense based on patient and insurance preference. Use up to four times daily as directed. (FOR ICD-9 250.00, 250.01).   . Cholecalciferol (VITAMIN D) 2000 units CAPS Take 1 capsule by mouth daily.   . clopidogrel (PLAVIX) 75 MG tablet Take 1 tablet (75 mg total) by mouth daily.   . clotrimazole (LOTRIMIN AF) 1 % cream Apply 1 application topically 2 (two) times daily. Rash under breast (Patient taking differently: Apply 1 application topically as needed. Rash under breast)   . Colchicine (MITIGARE) 0.6 MG CAPS Take 1 tablet by mouth daily as needed (for gout).   Marland Kitchen glucose blood (ONE TOUCH ULTRA TEST) test strip USE UP TO 4 TIMES DAILY AS DIRECTED Dx Code E11.8 E11.65   . hydrALAZINE (APRESOLINE) 50 MG tablet TAKE 1 TABLET BY MOUTH TWICE A DAY   . ibuprofen (ADVIL,MOTRIN) 400 MG tablet Take 1 tablet (400 mg total) by mouth every 6 (six) hours as needed.   . Insulin Glargine (BASAGLAR KWIKPEN) 100 UNIT/ML SOPN Inject 0.18 mLs (18 Units total) into the skin daily.   . Insulin Pen Needle (B-D UF III MINI PEN NEEDLES) 31G X 5 MM MISC Inject 1 each as directed at bedtime. Use as directed to inject insulin.  Dx code: E11.8, E11.65   .  losartan-hydrochlorothiazide (HYZAAR) 100-12.5 MG tablet Take 1 tablet by mouth daily.   . metoprolol succinate (TOPROL-XL) 25 MG 24 hr tablet Take 1 tablet (25 mg total) by mouth daily.   . nitroGLYCERIN (NITROSTAT) 0.4 MG SL tablet Place 0.4 mg under the tongue every 5 (five) minutes as needed for chest pain. 01/15/2018: Has not needed recently   . ONETOUCH DELICA LANCETS FINE MISC USE U PTO 4 TIMES DAILY AS DIRECTED   . potassium chloride (K-DUR) 10 MEQ tablet Take 1 tablet (10 mEq total) by mouth every Monday, Wednesday, and Friday.   . sitaGLIPtin (JANUVIA) 25 MG tablet Take 1 tablet (25 mg total) by mouth daily.    No facility-administered encounter medications on file as of 03/24/2019.     Functional Status:  In your present state of health, do you have any difficulty performing the following activities: 03/14/2019 03/03/2019  Hearing? Y N  Comment left ear changes -  Vision? N Y  Comment - -  Difficulty concentrating or making decisions? N N  Walking or climbing stairs? N N  Dressing or bathing? N N  Doing errands, shopping? 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 N N  Using the Toilet? N N  In the past six months, have you accidently leaked urine? N N  Do you have problems with loss  of bowel control? N N  Managing your Medications? N N  Managing your Finances? N N  Housekeeping or managing your Housekeeping? N N  Some recent data might be hidden    Fall/Depression Screening: Fall Risk  03/14/2019 03/03/2019 09/27/2018  Falls in the past year? '1 1 1  ' Comment tripped on rug - -  Number falls in past yr: 0 1 1  Comment - - -  Injury with Fall? 0 0 0  Risk Factor Category  - - -  Risk for fall due to : Medication side effect History of fall(s);Impaired balance/gait;Impaired mobility History of fall(s)  Risk for fall due to: Comment - - -  Follow up Falls evaluation completed;Falls prevention discussed Falls evaluation  completed;Falls prevention discussed;Education provided Falls evaluation completed;Falls prevention discussed   PHQ 2/9 Scores 03/14/2019 03/03/2019 09/27/2018 07/29/2018 05/27/2018 03/12/2018 03/06/2018  PHQ - 2 Score 0 0 0 0 0 0 0  PHQ- 9 Score 0 - - - - 0 -   THN CM Care Plan Problem One     Most Recent Value  Care Plan Problem One  Knowledge Deficit in self management of Diabetes  Role Documenting the Problem One  [place] for Problem One  Active  THN Long Term Goal   Patient will see a decrease in her A1C from 8.2 within the next 90 days  Interventions for Problem One Long Term Goal  Rn discussed with with patient about the decrease of A1C 8.2. RN encouraged patient in her progress. RN discussed eating healthy throughout the holidays. RN will follow up with further discussion  THN CM Short Term Goal #3  Patient will verbalize eating healthy foods and maintaing diet through the Holidays withn the next 30 days  Interventions for Short Tern Goal #3  RN reiterated maintaing diet through the holidays. Patient admitted getting off her diet a little. RN will follow up with further discussion      Assessment:  A1C 8.2 BS 155 Patient will continue to  benefit from [place] telephonic outreach for education and support for diabetes self management. Plan:  RN discussed maintaining diet during the holidays RN encouraged patient on her progress of A1C decrease RN will follow up outreach within the month of January  [name] [place] [facility] [phone]

## 2019-03-24 NOTE — Assessment & Plan Note (Signed)
Update labs.  

## 2019-03-24 NOTE — Assessment & Plan Note (Signed)
Encourage healthy diet and lifestyle changes to affect sustainable weight loss.

## 2019-03-24 NOTE — Assessment & Plan Note (Signed)
Chronic, stable. Update FLP.  The ASCVD Risk score Kristen Bussing DC Jr., et al., 2013) failed to calculate for the following reasons:   The patient has a prior MI or stroke diagnosis

## 2019-03-24 NOTE — Progress Notes (Signed)
This visit was conducted in person.  BP (!) 150/74 (BP Location: Right Arm, Cuff Size: Large)   Pulse 76   Temp 98 F (36.7 C) (Temporal)   Ht 5' 1.25" (1.556 m)   Wt 208 lb 4 oz (94.5 kg)   SpO2 97%   BMI 39.03 kg/m   BP Readings from Last 3 Encounters:  03/24/19 (!) 150/74  03/11/19 130/82  01/14/19 136/80    CC: CPE Subjective:    Patient ID: Kristen [name], female    DOB: Jul 03, 1940, 78 y.o.   MRN: [id number]  HPI: Kristen [name] is a 78 y.o. female presenting on 03/24/2019 for Annual Exam (Prt 2. )   Saw health advisor last week for medicare wellness visit. Note reviewed.   No exam data present    Clinical Support from 03/14/2019 in [place] at [facility] Total Score  0      Fall Risk  03/14/2019 03/03/2019 09/27/2018 07/29/2018 05/27/2018  Falls in the past year? _0 Comment tripped on rug - - - -  Number falls in past yr: 0 _1 Comment - - - - -  Injury with Fall? 0 0 0 0 0  Risk Factor Category  - - - - -  Risk for fall due to : Medication side effect History of fall(s);Impaired balance/gait;Impaired mobility History of fall(s) History of fall(s);Impaired balance/gait;Impaired mobility History of fall(s);Impaired balance/gait;Impaired mobility  Risk for fall due to: Comment - - - - -  Follow up Falls evaluation completed;Falls prevention discussed Falls evaluation completed;Falls prevention discussed;Education provided Falls evaluation completed;Falls prevention discussed Falls evaluation completed;Falls prevention discussed Falls evaluation completed;Falls prevention discussed      Dx R hip osteoarthritis earlier this month, treating with tylenol 584m with benefit, now no longer bothering her.   DM - sees podiatry for foot exam (Prudence Davidson - would want to see different provider. Compliant with basaglar 18u daily, januvia 255mdaily (restarted 12/2018). Checks sugars - fasting 119, evening 190.   Preventative: S/p colonoscopy "a while  ago" with Dr. NiBrunetta GeneraNo records received. Denies blood in stool. No fmhx colon cancer. Requests iFOB. Well woman - s/p hysterectomy for fibroids, ovaries remain. Discussed GYN cancers.Denies symptoms. Mammogram -Birads1 09/2018 - requests [id number] DEXAWNL 03/2015  Flu shot - declines  prevnar 08/2013, pneumovax2016  Zostavax - 03/2014 Shingrix - discussed. Would prefer to get at doctor's office.  Advanced directive: brings in today and will be scanned - sons ErnJaquelyn Bitterd EriRandall Hisse HCPBalcones Heightsoes not want prolonged life support if terminal condition. MOST form reviewed and filled out 10/2017 as well.  Seat belt use discussed Sunscreen use discussed. No changing moles on skin.  Non smoker  Alcohol - rare  Dentist - has dentures Eye exam - yearly Bowel no constipation Bladder - no urinary incontinence, no stress incontinence symptoms   Lives alone  4 grown children  Education: 11th grade Occupation: retired, was assScientist, water qualityctivity:no regular walking. Diet: good water, fruits/vegetables seldom.     Relevant past medical, surgical, family and social history reviewed and updated as indicated. Interim medical history since our last visit reviewed. Allergies and medications reviewed and updated. Outpatient Medications Prior to Visit  Medication Sig Dispense Refill  . amLODipine (NORVASC) 5 MG tablet Take 1 tablet (5 mg total) by mouth daily. 90 tablet 3  . atorvastatin (LIPITOR) 40 MG tablet Take 1 tablet (40 mg total) by mouth  daily. 90 tablet 3  . blood glucose meter kit and supplies KIT Dispense based on patient and insurance preference. Use up to four times daily as directed. (FOR ICD-9 250.00, 250.01). 1 each 0  . Cholecalciferol (VITAMIN D) 2000 units CAPS Take 1 capsule by mouth daily.    . clopidogrel (PLAVIX) 75 MG tablet Take 1 tablet (75 mg total) by mouth daily. 90 tablet 3  . clotrimazole (LOTRIMIN AF) 1 % cream Apply 1 application topically 2 (two) times daily.  Rash under breast (Patient taking differently: Apply 1 application topically as needed. Rash under breast) 45 g 1  . Colchicine (MITIGARE) 0.6 MG CAPS Take 1 tablet by mouth daily as needed (for gout). 30 capsule 3  . glucose blood (ONE TOUCH ULTRA TEST) test strip USE UP TO 4 TIMES DAILY AS DIRECTED Dx Code E11.8 E11.65 100 each 11  . hydrALAZINE (APRESOLINE) 50 MG tablet TAKE 1 TABLET BY MOUTH TWICE A DAY 180 tablet 1  . ibuprofen (ADVIL,MOTRIN) 400 MG tablet Take 1 tablet (400 mg total) by mouth every 6 (six) hours as needed. 30 tablet 0  . Insulin Glargine (BASAGLAR KWIKPEN) 100 UNIT/ML SOPN Inject 0.18 mLs (18 Units total) into the skin daily.    . Insulin Pen Needle (B-D UF III MINI PEN NEEDLES) 31G X 5 MM MISC Inject 1 each as directed at bedtime. Use as directed to inject insulin.  Dx code: E11.8, E11.65 100 each 1  . losartan-hydrochlorothiazide (HYZAAR) 100-12.5 MG tablet Take 1 tablet by mouth daily. 90 tablet 3  . metoprolol succinate (TOPROL-XL) 25 MG 24 hr tablet Take 1 tablet (25 mg total) by mouth daily. 90 tablet 3  . nitroGLYCERIN (NITROSTAT) 0.4 MG SL tablet Place 0.4 mg under the tongue every 5 (five) minutes as needed for chest pain.    [name] DELICA LANCETS FINE MISC USE U PTO 4 TIMES DAILY AS DIRECTED 100 each 2  . potassium chloride (K-DUR) 10 MEQ tablet Take 1 tablet (10 mEq total) by mouth every Monday, Wednesday, and Friday. 40 tablet 3  . sitaGLIPtin (JANUVIA) 25 MG tablet Take 1 tablet (25 mg total) by mouth daily. 90 tablet 3   No facility-administered medications prior to visit.      Per HPI unless specifically indicated in ROS section below Review of Systems  Constitutional: Negative for activity change, appetite change, chills, fatigue, fever and unexpected weight change.  HENT: Negative for hearing loss.   Eyes: Negative for visual disturbance.  Respiratory: Negative for cough, chest tightness, shortness of breath and wheezing.   Cardiovascular: Negative  for chest pain, palpitations and leg swelling.  Gastrointestinal: Negative for abdominal distention, abdominal pain, blood in stool, constipation, diarrhea, nausea and vomiting.  Genitourinary: Negative for difficulty urinating and hematuria.  Musculoskeletal: Negative for arthralgias, myalgias and neck pain.  Skin: Negative for rash.  Neurological: Negative for dizziness, seizures, syncope and headaches.  Hematological: Negative for adenopathy. Does not bruise/bleed easily.  Psychiatric/Behavioral: Negative for dysphoric mood. The patient is not nervous/anxious.    Objective:    BP (!) 150/74 (BP Location: Right Arm, Cuff Size: Large)   Pulse 76   Temp 98 F (36.7 C) (Temporal)   Ht 5' 1.25" (1.556 m)   Wt 208 lb 4 oz (94.5 kg)   SpO2 97%   BMI 39.03 kg/m   Wt Readings from Last 3 Encounters:  03/24/19 208 lb 4 oz (94.5 kg)  03/11/19 207 lb (93.9 kg)  01/14/19 206 lb 4 oz (  93.6 kg)    Physical Exam Vitals signs and nursing note reviewed.  Constitutional:      General: She is not in acute distress.    Appearance: Normal appearance. She is well-developed. She is obese. She is not ill-appearing.  HENT:     Head: Normocephalic and atraumatic.     Right Ear: Hearing, tympanic membrane, ear canal and external ear normal.     Left Ear: Hearing, tympanic membrane, ear canal and external ear normal.     Mouth/Throat:     Mouth: Mucous membranes are moist.     Pharynx: Uvula midline. No oropharyngeal exudate or posterior oropharyngeal erythema.  Eyes:     General: No scleral icterus.    Extraocular Movements: Extraocular movements intact.     Conjunctiva/sclera: Conjunctivae normal.     Pupils: Pupils are equal, round, and reactive to light.  Neck:     Musculoskeletal: Normal range of motion and neck supple.     Thyroid: No thyromegaly or thyroid tenderness.     Vascular: No carotid bruit.  Cardiovascular:     Rate and Rhythm: Normal rate and regular rhythm.     Pulses: Normal  pulses.          Radial pulses are 2+ on the right side and 2+ on the left side.     Heart sounds: Normal heart sounds. No murmur.  Pulmonary:     Effort: Pulmonary effort is normal. No respiratory distress.     Breath sounds: Normal breath sounds. No wheezing, rhonchi or rales.  Abdominal:     General: Abdomen is flat. Bowel sounds are normal. There is no distension.     Palpations: Abdomen is soft. There is no mass.     Tenderness: There is no abdominal tenderness. There is no guarding or rebound.     Hernia: No hernia is present.  Musculoskeletal: Normal range of motion.     Right lower leg: No edema.     Left lower leg: No edema.  Lymphadenopathy:     Cervical: No cervical adenopathy.  Skin:    General: Skin is warm and dry.     Findings: No rash.  Neurological:     General: No focal deficit present.     Mental Status: She is alert and oriented to person, place, and time.     Comments: CN grossly intact, station and gait intact  Psychiatric:        Mood and Affect: Mood normal.        Behavior: Behavior normal.        Thought Content: Thought content normal.        Judgment: Judgment normal.       Results for orders placed or performed in visit on 03/11/19  POCT Urinalysis Dipstick (Automated)  Result Value Ref Range   Color, UA yellow    Clarity, UA clear    Glucose, UA Negative Negative   Bilirubin, UA negative    Ketones, UA negative    Spec Grav, UA 1.015 1.010 - 1.025   Blood, UA negative    pH, UA 6.0 5.0 - 8.0   Protein, UA Negative Negative   Urobilinogen, UA 0.2 0.2 or 1.0 E.U./dL   Nitrite, UA negative    Leukocytes, UA Negative Negative   Assessment & Plan:  This visit occurred during the SARS-CoV-2 public health emergency.  Safety protocols were in place, including screening questions prior to the visit, additional usage of staff PPE, and extensive cleaning of  exam room while observing appropriate contact time as indicated for disinfecting solutions.    Problem List Items Addressed This Visit    Vitamin D deficiency    Update levels on 2000 IU replacement.       Relevant Orders   vit d   Type 2 diabetes mellitus with vascular disease ([place])   Thoracic aortic atherosclerosis (HCC)    Continue plavix, statin.      Severe obesity (BMI 35.0-39.9) with comorbidity ([place])    Encourage healthy diet and lifestyle changes to affect sustainable weight loss.       Renal insufficiency    Update labs.       Hyperlipidemia associated with type 2 diabetes mellitus (HCC)    Chronic, stable. Update FLP.  The ASCVD Risk score Mikey Bussing DC Jr., et al., 2013) failed to calculate for the following reasons:   The patient has a prior MI or stroke diagnosis       Relevant Orders   Lipid panel   Comprehensive metabolic panel   History of CVA (cerebrovascular accident)    Continue plavix.       Hip osteoarthritis    R hip pain has improved. Discussed PRN tylenol and voltaren gel.      Health maintenance examination - Primary    Preventative protocols reviewed and updated unless pt declined. Discussed healthy diet and lifestyle.       Gout    Update urate. No recent gout flare.       Relevant Orders   Uric acid   Essential hypertension    Chronic, adequate on recheck. Continue current regimen.       Diabetes mellitus type 2, uncontrolled, with complications (HCC)    Chronic, improved based on recall cbg's. Update A1c on januvia.       Relevant Orders   Hemoglobin A1c   CAD (coronary artery disease)/coronary calcifications    On statin, plavix.       Borderline hypothyroidism    Update labs.       Relevant Orders   TSH   T4, Free   Abdominal aortic atherosclerosis (HCC)    Continue statin, plavix.        Other Visit Diagnoses    Special screening for malignant neoplasms, colon       Relevant Orders   Fecal occult blood, imunochemical       No orders of the defined types were placed in this encounter.  Orders Placed  This Encounter  Procedures  . Fecal occult blood, imunochemical    Standing Status:   Future    Standing Expiration Date:   03/23/2020  . Lipid panel  . Comprehensive metabolic panel  . TSH  . Hemoglobin A1c  . vit d  . T4, Free  . Uric acid    Patient instructions: Pass by lab for blood work today and to pick up stool kit.  If interested, check with insurance or pharmacy about new 2 shot shingles series (shingrix).  BP is a little better. Keep an eye on this at home. I'm glad hip pain has improved - if recurrent you can use either tylenol or voltaren gel (over the counter) Return as needed or in 6 months for diabetes check.   Follow up plan: Return for follow up visit.  [name], MD

## 2019-03-24 NOTE — Assessment & Plan Note (Signed)
Update levels on 2000 IU replacement.

## 2019-03-24 NOTE — Assessment & Plan Note (Signed)
Continue statin, plavix.  

## 2019-03-24 NOTE — Assessment & Plan Note (Signed)
Chronic, adequate on recheck. Continue current regimen.

## 2019-03-24 NOTE — Assessment & Plan Note (Signed)
Chronic, improved based on recall cbg's. Update A1c on januvia.

## 2019-03-24 NOTE — Assessment & Plan Note (Signed)
Update urate. No recent gout flare 

## 2019-03-24 NOTE — Assessment & Plan Note (Signed)
Continue plavix, statin.  

## 2019-03-24 NOTE — Patient Instructions (Addendum)
Pass by lab for blood work today and to pick up stool kit.  If interested, check with insurance or pharmacy about new 2 shot shingles series (shingrix).  BP is a little better. Keep an eye on this at home. I'm glad hip pain has improved - if recurrent you can use either tylenol or voltaren gel (over the counter) Return as needed or in 6 months for diabetes check.   Health Maintenance After Age 77 After age 46, you are at a higher risk for certain long-term diseases and infections as well as injuries from falls. Falls are a major cause of broken bones and head injuries in people who are older than age 82. Getting regular preventive care can help to keep you healthy and well. Preventive care includes getting regular testing and making lifestyle changes as recommended by your health care provider. Talk with your health care provider about:  Which screenings and tests you should have. A screening is a test that checks for a disease when you have no symptoms.  A diet and exercise plan that is right for you. What should I know about screenings and tests to prevent falls? Screening and testing are the best ways to find a health problem early. Early diagnosis and treatment give you the best chance of managing medical conditions that are common after age 47. Certain conditions and lifestyle choices may make you more likely to have a fall. Your health care provider may recommend:  Regular vision checks. Poor vision and conditions such as cataracts can make you more likely to have a fall. If you wear glasses, make sure to get your prescription updated if your vision changes.  Medicine review. Work with your health care provider to regularly review all of the medicines you are taking, including over-the-counter medicines. Ask your health care provider about any side effects that may make you more likely to have a fall. Tell your health care provider if any medicines that you take make you feel dizzy or  sleepy.  Osteoporosis screening. Osteoporosis is a condition that causes the bones to get weaker. This can make the bones weak and cause them to break more easily.  Blood pressure screening. Blood pressure changes and medicines to control blood pressure can make you feel dizzy.  Strength and balance checks. Your health care provider may recommend certain tests to check your strength and balance while standing, walking, or changing positions.  Foot health exam. Foot pain and numbness, as well as not wearing proper footwear, can make you more likely to have a fall.  Depression screening. You may be more likely to have a fall if you have a fear of falling, feel emotionally low, or feel unable to do activities that you used to do.  Alcohol use screening. Using too much alcohol can affect your balance and may make you more likely to have a fall. What actions can I take to lower my risk of falls? General instructions  Talk with your health care provider about your risks for falling. Tell your health care provider if: ? You fall. Be sure to tell your health care provider about all falls, even ones that seem minor. ? You feel dizzy, sleepy, or off-balance.  Take over-the-counter and prescription medicines only as told by your health care provider. These include any supplements.  Eat a healthy diet and maintain a healthy weight. A healthy diet includes low-fat dairy products, low-fat (lean) meats, and fiber from whole grains, beans, and lots of fruits and  vegetables. Home safety  Remove any tripping hazards, such as rugs, cords, and clutter.  Install safety equipment such as grab bars in bathrooms and safety rails on stairs.  Keep rooms and walkways well-lit. Activity   Follow a regular exercise program to stay fit. This will help you maintain your balance. Ask your health care provider what types of exercise are appropriate for you.  If you need a cane or walker, use it as recommended by  your health care provider.  Wear supportive shoes that have nonskid soles. Lifestyle  Do not drink alcohol if your health care provider tells you not to drink.  If you drink alcohol, limit how much you have: ? 0-1 drink a day for women. ? 0-2 drinks a day for men.  Be aware of how much alcohol is in your drink. In the [place] one drink equals one typical bottle of beer (12 oz), one-half glass of wine (5 oz), or one shot of hard liquor (1 oz).  Do not use any products that contain nicotine or tobacco, such as cigarettes and e-cigarettes. If you need help quitting, ask your health care provider. Summary  Having a healthy lifestyle and getting preventive care can help to protect your health and wellness after age 53.  Screening and testing are the best way to find a health problem early and help you avoid having a fall. Early diagnosis and treatment give you the best chance for managing medical conditions that are more common for people who are older than age 97.  Falls are a major cause of broken bones and head injuries in people who are older than age 16. Take precautions to prevent a fall at home.  Work with your health care provider to learn what changes you can make to improve your health and wellness and to prevent falls. This information is not intended to replace advice given to you by your health care provider. Make sure you discuss any questions you have with your health care provider. Document Released: 02/21/2017 Document Revised: 08/01/2018 Document Reviewed: 02/21/2017 Elsevier Patient Education  2020 [employer].

## 2019-04-08 ENCOUNTER — Other Ambulatory Visit: Payer: Self-pay | Admitting: Pharmacy Technician

## 2019-04-08 NOTE — Patient Outreach (Signed)
[facility]) Care Management  04/08/2019  Kristen [name] 1940-07-30 [id number]   Return call to Ms. Klinke, HIPAA identifiers verified. Ms. Linnehan inquires about starting 2021 patient assistance process for Januvia and Engineer, agricultural.  Sent message to [place] to review patients medication list.  Maud Deed. Chana Bode [place] Certified Pharmacy Technician [place] Management Direct [phone]

## 2019-04-09 ENCOUNTER — Other Ambulatory Visit: Payer: Self-pay | Admitting: Pharmacist

## 2019-04-09 NOTE — Patient Outreach (Signed)
[name]) Care Management  [place] 04/09/2019  Kristen [name] Feb 01, 1941 [id number]  [facility] pharmacy assisted patient with patient assistance program applications for Basaglar and Januvia in 2020.  Patient would like to assistance with renewal process for 2021.  Patient had recent o/v with PCP and remains on both medications per active medication list in [facility].   [facility] pharmacy technician will mail patient and fax provider appropriate renewals form.   Ralene Bathe, PharmD, [place] [phone]

## 2019-04-10 ENCOUNTER — Encounter: Payer: Self-pay | Admitting: Pharmacy Technician

## 2019-04-11 ENCOUNTER — Telehealth: Payer: Self-pay | Admitting: Family Medicine

## 2019-04-11 NOTE — Telephone Encounter (Signed)
Received request from [facility] for assistance for basaglar through [facility]. Filled out and placed in Lisa's box.

## 2019-04-11 NOTE — Telephone Encounter (Signed)
Faxed form.

## 2019-04-15 MED ORDER — SITAGLIPTIN PHOSPHATE 50 MG PO TABS: 50.0000 mg | ORAL_TABLET | Freq: Every day | ORAL | 3 refills | Status: DC

## 2019-04-15 NOTE — Telephone Encounter (Signed)
Spoke with pt relaying Dr. G's message. Pt verbalizes understanding.  

## 2019-04-15 NOTE — Addendum Note (Signed)
Addended by: [name] on: 04/15/2019 12:41 PM   Modules accepted: Orders

## 2019-04-15 NOTE — Telephone Encounter (Addendum)
Cannot be faxed.  Placed application to interoffice mail in Lisa's box.  Actually I'd like to increase [place] to 50mg  daily - new dose printed out. plz let patient know I want to increase januvia to 50mg  - we are working on application for med assistance for her.

## 2019-04-28 ENCOUNTER — Other Ambulatory Visit: Payer: Self-pay | Admitting: Pharmacy Technician

## 2019-04-28 NOTE — Patient Outreach (Signed)
[name]) Care Management  04/28/2019  Kristen [name] May 29, 1940 [id number]   Received patient portion(s) of patient assistance application(s) for [employer]. Faxed completed application and required documents into [employer]. Will follow up with [employer] in 7-10 business days.  Prepared to mail application for Januvia to [employer]. Will follow up with [employer] in 14-21 business days.  Will follow up with company(ies) in 10-14 business days to check status of application(s).  [name] Certified Pharmacy Technician [place] Management Direct Dial:[phone]

## 2019-05-22 ENCOUNTER — Other Ambulatory Visit: Payer: Self-pay | Admitting: *Deleted

## 2019-05-26 NOTE — Patient Outreach (Signed)
[facility]) Care Management  [place]  [id number]  MAREESA [name] 10-09-1940 [id number]  [place] telephone call to patient.  [place] compliance verified. Per patient she is doing much better. Patient is trying to monitor blood sugars closely and eat healthy.  Patient has agreed to further outreach calls   Encounter Medications:  Outpatient Encounter Medications as of 05/22/2019  Medication Sig Note  . amLODipine (NORVASC) 5 MG tablet Take 1 tablet (5 mg total) by mouth daily.   Marland Kitchen atorvastatin (LIPITOR) 40 MG tablet Take 1 tablet (40 mg total) by mouth daily.   . blood glucose meter kit and supplies KIT Dispense based on patient and insurance preference. Use up to four times daily as directed. (FOR ICD-9 250.00, 250.01).   . Cholecalciferol (VITAMIN D) 2000 units CAPS Take 1 capsule by mouth daily.   . clopidogrel (PLAVIX) 75 MG tablet Take 1 tablet (75 mg total) by mouth daily.   . clotrimazole (LOTRIMIN AF) 1 % cream Apply 1 application topically 2 (two) times daily. Rash under breast (Patient taking differently: Apply 1 application topically as needed. Rash under breast)   . Colchicine (MITIGARE) 0.6 MG CAPS Take 1 tablet by mouth daily as needed (for gout).   Marland Kitchen glucose blood (ONE TOUCH ULTRA TEST) test strip USE UP TO 4 TIMES DAILY AS DIRECTED Dx Code E11.8 E11.65   . hydrALAZINE (APRESOLINE) 50 MG tablet TAKE 1 TABLET BY MOUTH TWICE A DAY   . ibuprofen (ADVIL,MOTRIN) 400 MG tablet Take 1 tablet (400 mg total) by mouth every 6 (six) hours as needed.   . Insulin Glargine (BASAGLAR KWIKPEN) 100 UNIT/ML SOPN Inject 0.18 mLs (18 Units total) into the skin daily.   . Insulin Pen Needle (B-D UF III MINI PEN NEEDLES) 31G X 5 MM MISC Inject 1 each as directed at bedtime. Use as directed to inject insulin.  Dx code: E11.8, E11.65   . losartan-hydrochlorothiazide (HYZAAR) 100-12.5 MG tablet Take 1 tablet by mouth daily.   . metoprolol succinate (TOPROL-XL) 25 MG 24  hr tablet Take 1 tablet (25 mg total) by mouth daily.   . nitroGLYCERIN (NITROSTAT) 0.4 MG SL tablet Place 0.4 mg under the tongue every 5 (five) minutes as needed for chest pain. 01/15/2018: Has not needed recently   . ONETOUCH DELICA LANCETS FINE MISC USE U PTO 4 TIMES DAILY AS DIRECTED   . potassium chloride (K-DUR) 10 MEQ tablet Take 1 tablet (10 mEq total) by mouth every Monday, Wednesday, and Friday.   . sitaGLIPtin (JANUVIA) 50 MG tablet Take 1 tablet (50 mg total) by mouth daily.    No facility-administered encounter medications on file as of 05/22/2019.    Functional Status:  In your present state of health, do you have any difficulty performing the following activities: 03/14/2019 03/03/2019  Hearing? Y N  Comment left ear changes -  Vision? N Y  Comment - -  Difficulty concentrating or making decisions? N N  Walking or climbing stairs? N N  Dressing or bathing? N N  Doing errands, shopping? 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 N N  Using the Toilet? N N  In the past six months, have you accidently leaked urine? N N  Do you have problems with loss of bowel control? N N  Managing your Medications? N N  Managing your Finances? N N  Housekeeping or managing your Housekeeping? N N  Some recent data might be hidden    Fall/Depression Screening: Fall Risk  05/22/2019 03/14/2019 03/03/2019  Falls in the past year? _0 Comment - tripped on rug -  Number falls in past yr: 1 0 1  Comment - - -  Injury with Fall? 0 0 0  Risk Factor Category  - - -  Risk for fall due to : History of fall(s);Impaired balance/gait;Impaired mobility Medication side effect History of fall(s);Impaired balance/gait;Impaired mobility  Risk for fall due to: Comment - - -  Follow up Falls evaluation completed;Falls prevention discussed Falls evaluation completed;Falls prevention discussed Falls evaluation completed;Falls prevention discussed;Education  provided   [facility] 2/9 Scores 03/14/2019 03/03/2019 09/27/2018 07/29/2018 05/27/2018 03/12/2018 03/06/2018  PHQ - 2 Score 0 0 0 0 0 0 0  PHQ- 9 Score 0 - - - - 0 -   [facility] CM Care Plan Problem One     Most Recent Value  Care Plan Problem One  Knowledge Deficit in self management of Diabetes  Role Documenting the Problem One  Health Coach  [place] Term Goal   Patient will see a decrease in her A1C from 8.2 within the next 90 days  [place] Term Goal Start Date  05/22/19  Interventions for Problem One Long Term Goal  RN reiterated healthy eating, monitoring blood sugar. RN will will follow up for compliance  [facility] CM Short Term Goal #3  Patient will verbalize eating healthy foods and maintaing diet through the Holidays withn the next 30 days  [facility] CM Short Term Goal #3 Met Date  05/22/19      Assessment:  Patient is monitoring blood sugars as per Dr ordered Patient fasting blood sugar is 119 Patient is trying to eat healthy  Plan:  RN reiterated healthy eating RN reiterated A1C goal RN discussed COVID Vaccine RN will continue to encourage patient to help patient reach A1C goal RN will follow up within then month of April.  [place] Care Management [phone]

## 2019-06-03 ENCOUNTER — Other Ambulatory Visit: Payer: Self-pay | Admitting: Pharmacy Technician

## 2019-06-03 NOTE — Patient Outreach (Signed)
[facility]) Care Management  06/03/2019  Branisha Jerrett Hults [birth year] [id number]   Return call placed to patient, HIPAA identifiers verified. Ms. Sulkowski states that she received a letter from [employer] patient assistance regarding her Engineer, agricultural. Informed her that Ralph Leyden is requiring her to submit her most recent proof of income documents. She confirms that she has received her Presenter, broadcasting for 2021. Informed her I would mail her a return envelope for her to return it in.  [name] [place] Certified Pharmacy Technician [place] Management Direct Dial:[phone]

## 2019-06-10 ENCOUNTER — Other Ambulatory Visit: Payer: Self-pay | Admitting: Pharmacy Technician

## 2019-06-10 NOTE — Patient Outreach (Signed)
[facility]) Care Management  06/10/2019  Kristen [name] 1940-08-13 [id number]    Follow up call placed to [employer] regarding patient assistance application(s) for Kristen [name] confirms application has been received. Attestation form was mailed to patient on 06/02/19.   Unsuccessful call placed to patient regarding patient assistance receipt of attestation form from Kristen [name] for Kristen [name], unable to leave voicemail. Follow up:  Will follow up with patient in 2-3 business days.  [name] [place] Certified Pharmacy Technician [place] Management Direct Dial:[phone]

## 2019-06-13 ENCOUNTER — Other Ambulatory Visit: Payer: Self-pay | Admitting: Pharmacy Technician

## 2019-06-13 NOTE — Patient Outreach (Signed)
[facility]) Care Management  06/13/2019  Kristen [name] 02-08-1941 [id number]   Received patients most recent proof of income document, as well as the [employer] attestation form and original applications. Faxed updated income document into [employer] to continue the processing of patient's application for [employer].  Mailed signed attestation and original application back into [employer] for continuation for application process for [place].  Will follow up with companies in 10-14 business days to check application statuses.  Kristen [name] [place] Certified Pharmacy Technician [place] Management Direct Dial:[phone]

## 2019-06-23 ENCOUNTER — Telehealth: Payer: Self-pay | Admitting: Family Medicine

## 2019-06-23 NOTE — Progress Notes (Signed)
Chronic Care Management   Note  06/23/2019 Name: Kristen [name] MRN: [id number] DOB: [birth year]  Kristen [name] is a 79 y.o. year old female who is a primary care patient of [name], MD. I reached out to [employer] by phone today in response to a referral sent by Kristen [name] PCP, [name], MD.   Kristen [name] was given information about Chronic Care Management services today including:  1. CCM service includes personalized support from designated clinical staff supervised by her physician, including individualized plan of care and coordination with other care providers 2. 24/7 contact phone numbers for assistance for urgent and routine care needs. 3. Service will only be billed when office clinical staff spend 20 minutes or more in a month to coordinate care. 4. Only one practitioner may furnish and bill the service in a calendar month. 5. The patient may stop CCM services at any time (effective at the end of the month) by phone call to the office staff. 6. The patient will be responsible for cost sharing (co-pay) of up to 20% of the service fee (after annual deductible is met).  Patient agreed to services and verbal consent obtained.   Follow up plan:   Raynicia Dukes [employer] Scheduler

## 2019-06-24 ENCOUNTER — Telehealth: Payer: Self-pay | Admitting: Family Medicine

## 2019-06-24 NOTE — Chronic Care Management (AMB) (Signed)
  Chronic Care Management   Note  06/24/2019 Name: Kristen [name] MRN: [id number] DOB: 1940-08-28  Kristen [name] is a 79 y.o. year old female who is a primary care patient of [name], MD. I reached out to [employer] by phone today in response to a referral sent by Kristen [name] PCP, [name], MD.   Kristen [name] was given information about Chronic Care Management services today including:  1. CCM service includes personalized support from designated clinical staff supervised by her physician, including individualized plan of care and coordination with other care providers 2. 24/7 contact phone numbers for assistance for urgent and routine care needs. 3. Service will only be billed when office clinical staff spend 20 minutes or more in a month to coordinate care. 4. Only one practitioner may furnish and bill the service in a calendar month. 5. The patient may stop CCM services at any time (effective at the end of the month) by phone call to the office staff. 6. The patient will be responsible for cost sharing (co-pay) of up to 20% of the service fee (after annual deductible is met).  Patient agreed to services and verbal consent obtained.   Follow up plan:   Raynicia Dukes [employer] Scheduler

## 2019-06-26 ENCOUNTER — Other Ambulatory Visit: Payer: Self-pay | Admitting: Pharmacy Technician

## 2019-06-26 NOTE — Patient Outreach (Signed)
[facility]) Care Management  06/26/2019  Kristen [name] 1940-09-18 [id number]   Follow up call placed to[employer]illy Caresregarding patient assistance application(s) forTrulicity,Cathy confirms patient has been approved as of 3/2 until 04/23/2020.[facility] to reach out to patient to set up delivery.  Follow up:  Will follow up with patient in 3-5 business days to confirms shipment has been scheduled.  [name] [place] Certified Pharmacy Technician [place] Management Direct Dial:[phone]

## 2019-07-01 ENCOUNTER — Other Ambulatory Visit: Payer: Self-pay | Admitting: Family Medicine

## 2019-07-14 ENCOUNTER — Telehealth: Payer: Self-pay

## 2019-07-14 ENCOUNTER — Other Ambulatory Visit: Payer: Self-pay | Admitting: Pharmacist

## 2019-07-14 ENCOUNTER — Other Ambulatory Visit: Payer: Self-pay | Admitting: Pharmacy Technician

## 2019-07-14 DIAGNOSIS — I10 Essential (primary) hypertension: Secondary | ICD-10-CM

## 2019-07-14 DIAGNOSIS — E1169 Type 2 diabetes mellitus with other specified complication: Secondary | ICD-10-CM

## 2019-07-14 NOTE — Telephone Encounter (Signed)
I would like to request a referral for Kristen [name] to chronic care management pharmacy services for the following conditions:   Essential hypertension, benign  [I10]  Hyperlipidemia [E78.5]  [name], PharmD Clinical Pharmacist [place] [facility] at [facility] [phone]

## 2019-07-14 NOTE — Patient Outreach (Signed)
[name]) Care Management  07/14/2019  Kristen [name] [birth year] [id number]    Follow up call placed to [employer] regarding patient assistance application(s) for [name] confirms patient has been approved as of 3/10 until 04/23/20. Medication to arrive at patients home in the next 7-10 business days if it hasn't already.  Unsuccessful call placed to patient to verify/inform of above information, unable to leave a voicemail.  Will make additional call in 3-5 business days.  [name] [place] Certified Pharmacy Technician [place] Management Direct Dial:[phone]

## 2019-07-14 NOTE — Patient Outreach (Signed)
[facility] ([facility])  [facility] Quality Pharmacy Team    [facility] pharmacy case will be closed as our team is transitioning from the [facility] Care Management Department into the [facility] Quality Department and will no longer be using [facility] for documentation purposes.   [facility] pharmacy technician will continue to assist patient with medication assistance program applications until complete.     Salmaan Patchin, PharmD, BCPS Clinical Pharmacist [facility] [phone]      

## 2019-07-15 ENCOUNTER — Other Ambulatory Visit: Payer: Self-pay

## 2019-07-15 ENCOUNTER — Ambulatory Visit: Payer: Medicare Other

## 2019-07-15 DIAGNOSIS — E1165 Type 2 diabetes mellitus with hyperglycemia: Secondary | ICD-10-CM

## 2019-07-15 DIAGNOSIS — D649 Anemia, unspecified: Secondary | ICD-10-CM

## 2019-07-15 DIAGNOSIS — E1169 Type 2 diabetes mellitus with other specified complication: Secondary | ICD-10-CM

## 2019-07-15 DIAGNOSIS — N289 Disorder of kidney and ureter, unspecified: Secondary | ICD-10-CM

## 2019-07-15 DIAGNOSIS — I2510 Atherosclerotic heart disease of native coronary artery without angina pectoris: Secondary | ICD-10-CM

## 2019-07-15 DIAGNOSIS — M1A09X0 Idiopathic chronic gout, multiple sites, without tophus (tophi): Secondary | ICD-10-CM

## 2019-07-15 DIAGNOSIS — M1712 Unilateral primary osteoarthritis, left knee: Secondary | ICD-10-CM

## 2019-07-15 DIAGNOSIS — Z86718 Personal history of other venous thrombosis and embolism: Secondary | ICD-10-CM

## 2019-07-15 DIAGNOSIS — E1159 Type 2 diabetes mellitus with other circulatory complications: Secondary | ICD-10-CM

## 2019-07-15 DIAGNOSIS — E785 Hyperlipidemia, unspecified: Secondary | ICD-10-CM

## 2019-07-15 DIAGNOSIS — E039 Hypothyroidism, unspecified: Secondary | ICD-10-CM

## 2019-07-15 DIAGNOSIS — I10 Essential (primary) hypertension: Secondary | ICD-10-CM

## 2019-07-15 DIAGNOSIS — E559 Vitamin D deficiency, unspecified: Secondary | ICD-10-CM

## 2019-07-15 DIAGNOSIS — IMO0002 Reserved for concepts with insufficient information to code with codable children: Secondary | ICD-10-CM

## 2019-07-15 NOTE — Chronic Care Management (AMB) (Signed)
Chronic Care Management Pharmacy  Name: Kristen [name]  MRN: [id number] DOB: 1940/09/15  Chief Complaint/ HPI  Kristen [name],  79 y.o., female presents for their Initial CCM visit with the clinical pharmacist via telephone.  PCP : [name], MD  Their chronic conditions include: hypertension, CAD, hyperlipidemia, type 2 diabetes, osteoarthritis, gout, vitamin D deficiency, renal insufficiency, anemia  Patient concerns: doing well since stroke, not using walker, able to drive again; denies medication adverse effects, reports being on current medications for a long time, doesn't feel like she needs all the medicine because she feels better, confused about indications  Office Visits:  03/24/19: [name] - continue current medications, update labs, tylenol and voltaren gel for OA   03/11/19: [name] - hip pain/arthritis, groin pain, try tylenol 500 mg qhs up to BID   Consult Visit: none in past 6 months  Allergies  Allergen Reactions  . Peanut-Containing Drug Products Itching  . Acetaminophen Other (See Comments)    "Causes me to spit up blood"  . Aleve [Naproxen Sodium] Other (See Comments)    Spits up blood  . Doxycycline Other (See Comments)    Malaise, GI upset, "felt drunk" and very ill  . Metformin And Related Other (See Comments)    Chills, dizziness  . Penicillins Rash    Has patient had a PCN reaction causing immediate rash, facial/tongue/throat swelling, SOB or lightheadedness with hypotension: Yes Has patient had a PCN reaction causing severe rash involving mucus membranes or skin necrosis: No Has patient had a PCN reaction that required hospitalization No Has patient had a PCN reaction occurring within the last 10 years: No If all of the above answers are "NO", then may proceed with Cephalosporin use.    Medications: Outpatient Encounter Medications as of 07/15/2019  Medication Sig Note  . amLODipine (NORVASC) 5 MG tablet Take 1 tablet (5 mg total)  by mouth daily.   Marland Kitchen atorvastatin (LIPITOR) 40 MG tablet Take 1 tablet (40 mg total) by mouth daily.   . blood glucose meter kit and supplies KIT Dispense based on patient and insurance preference. Use up to four times daily as directed. (FOR ICD-9 250.00, 250.01).   . Cholecalciferol (VITAMIN D) 2000 units CAPS Take 1 capsule by mouth daily.   . clopidogrel (PLAVIX) 75 MG tablet Take 1 tablet (75 mg total) by mouth daily.   . clotrimazole (LOTRIMIN AF) 1 % cream Apply 1 application topically 2 (two) times daily. Rash under breast (Patient taking differently: Apply 1 application topically as needed. Rash under breast)   . Colchicine (MITIGARE) 0.6 MG CAPS Take 1 tablet by mouth daily as needed (for gout).   . hydrALAZINE (APRESOLINE) 50 MG tablet TAKE 1 TABLET BY MOUTH TWICE A DAY   . ibuprofen (ADVIL,MOTRIN) 400 MG tablet Take 1 tablet (400 mg total) by mouth every 6 (six) hours as needed.   . Insulin Glargine (BASAGLAR KWIKPEN) 100 UNIT/ML SOPN Inject 0.18 mLs (18 Units total) into the skin daily.   . Insulin Pen Needle (B-D UF III MINI PEN NEEDLES) 31G X 5 MM MISC Inject 1 each as directed at bedtime. Use as directed to inject insulin.  Dx code: E11.8, E11.65   . losartan-hydrochlorothiazide (HYZAAR) 100-12.5 MG tablet Take 1 tablet by mouth daily.   . metoprolol succinate (TOPROL-XL) 25 MG 24 hr tablet Take 1 tablet (25 mg total) by mouth daily.   . nitroGLYCERIN (NITROSTAT) 0.4 MG SL tablet Place 0.4 mg under the tongue every 5 (  five) minutes as needed for chest pain. 01/15/2018: Has not needed recently   . ONETOUCH DELICA LANCETS FINE MISC USE U PTO 4 TIMES DAILY AS DIRECTED   . ONETOUCH ULTRA test strip USE UP TO 4 TIMES DAILY AS DIRECTED   . potassium chloride (K-DUR) 10 MEQ tablet Take 1 tablet (10 mEq total) by mouth every Monday, Wednesday, and Friday.   . sitaGLIPtin (JANUVIA) 50 MG tablet Take 1 tablet (50 mg total) by mouth daily.    No facility-administered encounter medications on  file as of 07/15/2019.   Current Diagnosis/Assessment: Goals    . chronic health management     Starting 03/12/2018, I will continue to take medications as prescribed to manage health conditions.    . Patient Stated     03/14/2019, I will maintain and continue medications as prescribed.     . Pharmacy Care Plan     CARE PLAN ENTRY  Current Barriers:  . Chronic Disease Management support, education, and care coordination needs related to hypertension, CAD, hyperlipidemia, type 2 diabetes, osteoarthritis, gout, vitamin D deficiency, renal insufficiency, anemia  Pharmacist Clinical Goal(s):  Marland Kitchen Improve control of diabetes with goal A1c of less than 7%. Recommend checking blood glucose every morning before breakfast and 2 hours after your evening meal. Continue to focus on heart healthy diet and exercise. A team member will call to check on your blood sugar readings in April.  . Remain up to date on vaccinations. Recommend the shingles vaccine (Shingrix) and tetanus (Tdap) vaccine.   Interventions: . Comprehensive medication review performed.  Patient Self Care Activities:  . Self administers medications as prescribed  Initial goal documentation      Hypertension   CMP Latest Ref Rng & Units 03/24/2019 07/02/2018 03/12/2018  Glucose 70 - 99 mg/dL 155(H) 178(H) 135(H)  BUN 6 - 23 mg/dL 19 22 27(H)  Creatinine 0.40 - 1.20 mg/dL 1.15 1.15 1.43(H)  Sodium 135 - 145 mEq/L 139 139 140  Potassium 3.5 - 5.1 mEq/L 4.1 3.7 3.8  Chloride 96 - 112 mEq/L 101 101 100  CO2 19 - 32 mEq/L '31 31 31  ' Calcium 8.4 - 10.5 mg/dL 9.3 9.9 9.7  Total Protein 6.0 - 8.3 g/dL 6.3 - 7.1  Total Bilirubin 0.2 - 1.2 mg/dL 0.5 - 0.8  Alkaline Phos 39 - 117 U/L 92 - 111  AST 0 - 37 U/L 14 - 19  ALT 0 - 35 U/L 11 - 22   Office blood pressures are: BP Readings from Last 3 Encounters:  03/24/19 (!) 150/74  03/11/19 130/82  01/14/19 136/80   Patient has failed these meds in the past: none Patient checks BP  at home: hasn't checked in a while Patient home BP readings are ranging: none reported  Patient is currently controlled on the following medications:   Hydralazine 50 mg - 1 tablet BID   Amlodipine 5 mg - 1 tablet daily (not taking)  Metoprolol Succinate 25 mg - 1 tablet daily  Losartan-HCTZ 100-12.5 mg - 1 tablet daily  Potassium Chloride 10 mEq - 1 tablet every Monday, Wednesday, Friday  We discussed: confirmed adherence to losartan/hctz, hydralazine, metoprolol succinate - all as prescribed and potassium three days a week; as patient would like to be on less medication, recommended home BP monitoring --> she is not interested in checking BP until weather gets better/COVID-19 vaccine; reports she was told by home health nurse to stop amlodipine, confirmed no longer taking metoprolol tartrate   Plan: Continue current  medications; Consider home monitoring at follow up visit.   Hyperlipidemia   CBC Latest Ref Rng & Units 02/20/2018 08/12/2017 09/05/2016  WBC 4.0 - 10.5 K/uL 9.5 11.6(H) 6.3  Hemoglobin 12.0 - 15.0 g/dL 10.9(L) 12.4 12.2  Hematocrit 36.0 - 46.0 % 35.5(L) 38.9 38.4  Platelets 150 - 400 K/uL 284 247 234   Lipid Panel     Component Value Date/Time   CHOL 134 03/24/2019 0957   CHOL 138 07/03/2013 0442   TRIG 100.0 03/24/2019 0957   TRIG 77 07/03/2013 0442   HDL 40.50 03/24/2019 0957   HDL 41 07/03/2013 0442   CHOLHDL 3 03/24/2019 0957   VLDL 20.0 03/24/2019 0957   VLDL 15 07/03/2013 0442   LDLCALC 73 03/24/2019 0957   LDLCALC 82 07/03/2013 0442   LDLDIRECT 102.0 12/31/2015 0941    The ASCVD Risk score (Goff DC Jr., et al., 2013) failed to calculate for the following reasons:   The patient has a prior MI or stroke diagnosis   LDL goal < 70 Patient has failed these meds in past: none Patient is currently controlled on the following medications:  Atorvastatin 40 mg - 1 tablet daily   Clopidogrel 75 mg - 1 tablet daily   Nitroglycerin 0.4 mg SL - PRN  We  discussed: confirmed adherence, denies needing nitroglycerin, but aware of how to use; LDL is near goal but slightly above 70, pt is not interested in additional medication  Plan: Continue current medications  Diabetes   Recent Relevant Labs: Lab Results  Component Value Date/Time   HGBA1C 8.2 (H) 03/24/2019 09:57 AM   HGBA1C 8.6 (A) 10/17/2018 09:12 AM   HGBA1C 8.6 (A) 07/02/2018 09:32 AM   HGBA1C 7.9 (H) 03/12/2018 09:34 AM   MICROALBUR 7.8 (H) 08/09/2016 10:28 AM   MICROALBUR 2.6 (H) 12/08/2013 10:31 AM    A1c goal < 7% Checking BG: 2-3 times a week Recent FBG Readings: averages around 100  Recent 2hr PP BG readings: 220s Hypoglycemia: denies GFR: 55 ml/min (03/24/19)  Patient has failed these meds in past: metformin (chills, dizziness) Patient is currently uncontrolled on the following medications:   Sitagliptin 50 mg - 1 tablet daily    Basaglar - Inject 18 units daily (PM)  Last diabetic eye exam:  Lab Results  Component Value Date/Time   HMDIABEYEEXA No Retinopathy 10/15/2018 12:00 AM    Last diabetic foot exam: reports Dr. Darnell Level checked her feet at last visit, denies abnormalities   We discussed: discussed importance of diet, especially as fasting readings appear to be controlled; pt reports eating smaller portion and eating baked meats rather than fried resulting in previous decrease in A1c; lengthy discussion about progressive nature of diabetes and importance of medication, diet, and exercise as patient has strong desire to not be on any medication; explained this is unlikely due to nature of insulin resistance   Plan: Continue current medications; Recommend monitoring after meals to better assess post-prandial readings. Will have team member follow up in April. May consider sitagliptin 100 mg or alternative for post prandial control.  Gout   Last gout flare: 3-4 months ago (usually when she eats too much red meat) Patient has failed these meds in past:  none Patient is currently controlled on the following medications:   Colchicine 0.6 mg - 1 tablet PRN  Ibuprofen 400 mg - 1 tablet q6h PRN (not taking)  We discussed: takes 2 colchicine on first day of flare, then 1 a day until resolved; denies Advil  use  Plan: Continue current medications   Osteoarthritis   Patient has failed these meds in past: none Patient is currently controlled on the following medications:   Tylenol 325 mg - 1-2 tablets every 4-6 hours as needed  Voltaren Gel 1% - not taking   We discussed: pt denies chronic pain, will take an occasional Tylenol for headache  Plan: Continue current medications   Vitamin D Deficiency   Vit D: (03/24/19) 32  Patient has failed these meds in past: none Patient is currently controlled on the following medications:    Vitamin D 2000 IU - daily  We discussed: confirmed adherence   Plan: Continue current medications  Vaccines   Reviewed and discussed patient's vaccination history.    Immunization History  Administered Date(s) Administered  . PPD Test 09/25/2014  . Pneumococcal Conjugate-13 09/10/2013  . Pneumococcal Polysaccharide-23 03/10/2015  . Zoster 04/21/2014   Plan: Recommended patient receive seasonal influenza, Shingrix and Tdap.  Medication Management  OTCs: clotrimazole 1% cream - PRN  Pharmacy/Benefits: [facility] (declined [employer] services)  Adherence: takes all meds in the morning, except 1 hydralazine in the evening; uses pillbox  Affordability: no concerns  CCM Follow Up: November 11, 2019 at 10:00 AM (F2F)  [name], PharmD Clinical Pharmacist [place] [facility] at [facility] [phone]

## 2019-07-16 NOTE — Patient Instructions (Addendum)
Dear Kristen [name],  It was a pleasure meeting you during our initial appointment on July 15, 2019. Below is a summary of the goals we discussed and components of chronic care management. Please contact me anytime with questions or concerns.   Visit Information  Goals    . chronic health management     Starting 03/12/2018, I will continue to take medications as prescribed to manage health conditions.    . Patient Stated     03/14/2019, I will maintain and continue medications as prescribed.     . Pharmacy Care Plan     CARE PLAN ENTRY  Current Barriers:  . Chronic Disease Management support, education, and care coordination needs related to hypertension, CAD, hyperlipidemia, type 2 diabetes, osteoarthritis, gout, vitamin D deficiency, renal insufficiency, anemia  Pharmacist Clinical Goal(s):  Marland Kitchen Improve control of diabetes with goal A1c of less than 7%. Recommend checking blood glucose every morning before breakfast and 2 hours after your evening meal. Continue to focus on heart healthy diet and exercise. A team member will call to check on your blood sugar readings in April.  . Remain up to date on vaccinations. Recommend the shingles vaccine (Shingrix) and tetanus (Tdap) vaccine.   Interventions: . Comprehensive medication review performed.  Patient Self Care Activities:  . Self administers medications as prescribed  Initial goal documentation      Ms. Escher was given information about Chronic Care Management services today including:  1. CCM service includes personalized support from designated clinical staff supervised by her physician, including individualized plan of care and coordination with other care providers 2. 24/7 contact phone numbers for assistance for urgent and routine care needs. 3. Standard insurance, coinsurance, copays and deductibles apply for chronic care management only during months in which we provide at least 20 minutes of these services. Most  insurances cover these services at 100%, however patients may be responsible for any copay, coinsurance and/or deductible if applicable. This service may help you avoid the need for more expensive face-to-face services. 4. Only one practitioner may furnish and bill the service in a calendar month. 5. The patient may stop CCM services at any time (effective at the end of the month) by phone call to the office staff.  Patient agreed to services and verbal consent obtained.   The patient verbalized understanding of instructions provided today and agreed to receive a mailed copy of patient instruction and/or educational materials.\ Face to Face appointment with pharmacist scheduled for:  November 11, 2019 at 10:00 AM (in office)  [name], PharmD Clinical Pharmacist [facility] [phone]   Diabetes Mellitus and Nutrition, Adult When you have diabetes (diabetes mellitus), it is very important to have healthy eating habits because your blood sugar (glucose) levels are greatly affected by what you eat and drink. Eating healthy foods in the appropriate amounts, at about the same times every day, can help you: Control your blood glucose. Lower your risk of heart disease. Improve your blood pressure. Reach or maintain a healthy weight. Every person with diabetes is different, and each person has different needs for a meal plan. Your health care provider may recommend that you work with a diet and nutrition specialist (dietitian) to make a meal plan that is best for you. Your meal plan may vary depending on factors such as: The calories you need. The medicines you take. Your weight. Your blood glucose, blood pressure, and cholesterol levels. Your activity level. Other health conditions you  have, such as heart or kidney disease. How do carbohydrates affect me? Carbohydrates, also called carbs, affect your blood glucose level more than any other type of food. Eating  carbs naturally raises the amount of glucose in your blood. Carb counting is a method for keeping track of how many carbs you eat. Counting carbs is important to keep your blood glucose at a healthy level, especially if you use insulin or take certain oral diabetes medicines. It is important to know how many carbs you can safely have in each meal. This is different for every person. Your dietitian can help you calculate how many carbs you should have at each meal and for each snack. Foods that contain carbs include: Bread, cereal, rice, pasta, and crackers. Potatoes and corn. Peas, beans, and lentils. Milk and yogurt. Fruit and juice. Desserts, such as cakes, cookies, ice cream, and candy. How does alcohol affect me? Alcohol can cause a sudden decrease in blood glucose (hypoglycemia), especially if you use insulin or take certain oral diabetes medicines. Hypoglycemia can be a life-threatening condition. Symptoms of hypoglycemia (sleepiness, dizziness, and confusion) are similar to symptoms of having too much alcohol. If your health care provider says that alcohol is safe for you, follow these guidelines: Limit alcohol intake to no more than 1 drink per day for nonpregnant women and 2 drinks per day for men. One drink equals 12 oz of beer, 5 oz of wine, or 1 oz of hard liquor. Do not drink on an empty stomach. Keep yourself hydrated with water, diet soda, or unsweetened iced tea. Keep in mind that regular soda, juice, and other mixers may contain a lot of sugar and must be counted as carbs. What are tips for following this plan?  Reading food labels Start by checking the serving size on the "Nutrition Facts" label of packaged foods and drinks. The amount of calories, carbs, fats, and other nutrients listed on the label is based on one serving of the item. Many items contain more than one serving per package. Check the total grams (g) of carbs in one serving. You can calculate the number of  servings of carbs in one serving by dividing the total carbs by 15. For example, if a food has 30 g of total carbs, it would be equal to 2 servings of carbs. Check the number of grams (g) of saturated and trans fats in one serving. Choose foods that have low or no amount of these fats. Check the number of milligrams (mg) of salt (sodium) in one serving. Most people should limit total sodium intake to less than 2,300 mg per day. Always check the nutrition information of foods labeled as "low-fat" or "nonfat". These foods may be higher in added sugar or refined carbs and should be avoided. Talk to your dietitian to identify your daily goals for nutrients listed on the label. Shopping Avoid buying canned, premade, or processed foods. These foods tend to be high in fat, sodium, and added sugar. Shop around the outside edge of the grocery store. This includes fresh fruits and vegetables, bulk grains, fresh meats, and fresh dairy. Cooking Use low-heat cooking methods, such as baking, instead of high-heat cooking methods like deep frying. Cook using healthy oils, such as olive, canola, or sunflower oil. Avoid cooking with butter, cream, or high-fat meats. Meal planning Eat meals and snacks regularly, preferably at the same times every day. Avoid going long periods of time without eating. Eat foods high in fiber, such as fresh fruits,  vegetables, beans, and whole grains. Talk to your dietitian about how many servings of carbs you can eat at each meal. Eat 4-6 ounces (oz) of lean protein each day, such as lean meat, chicken, fish, eggs, or tofu. One oz of lean protein is equal to: 1 oz of meat, chicken, or fish. 1 egg.  cup of tofu. Eat some foods each day that contain healthy fats, such as avocado, nuts, seeds, and fish. Lifestyle Check your blood glucose regularly. Exercise regularly as told by your health care provider. This may include: 150 minutes of moderate-intensity or vigorous-intensity  exercise each week. This could be brisk walking, biking, or water aerobics. Stretching and doing strength exercises, such as yoga or weightlifting, at least 2 times a week. Take medicines as told by your health care provider. Do not use any products that contain nicotine or tobacco, such as cigarettes and e-cigarettes. If you need help quitting, ask your health care provider. Work with a Social worker or diabetes educator to identify strategies to manage stress and any emotional and social challenges. Questions to ask a health care provider Do I need to meet with a diabetes educator? Do I need to meet with a dietitian? What number can I call if I have questions? When are the best times to check my blood glucose? Where to find more information: American Diabetes Association: diabetes.org Academy of Nutrition and Dietetics: www.eatright.[employer] of Diabetes and Digestive and Kidney Diseases (NIH): [employer] Summary A healthy meal plan will help you control your blood glucose and maintain a healthy lifestyle. Working with a diet and nutrition specialist (dietitian) can help you make a meal plan that is best for you. Keep in mind that carbohydrates (carbs) and alcohol have immediate effects on your blood glucose levels. It is important to count carbs and to use alcohol carefully. This information is not intended to replace advice given to you by your health care provider. Make sure you discuss any questions you have with your health care provider. Document Revised: 03/23/2017 Document Reviewed: 05/15/2016 Elsevier Patient Education  2020 [employer].

## 2019-07-22 ENCOUNTER — Other Ambulatory Visit: Payer: Self-pay | Admitting: Family Medicine

## 2019-08-14 ENCOUNTER — Other Ambulatory Visit: Payer: Self-pay | Admitting: *Deleted

## 2019-08-14 NOTE — Patient Outreach (Signed)
[place] [facility]) Care Management  08/14/2019  Kristen [name] 1940/07/14 [id number]   RN Health Coach attempted follow up outreach call to patient.  Patient was unavailable. HIPPA compliance voicemail message left with return callback number.  Plan: RN will call patient again within 30 days.  [place] Care Management [phone]

## 2019-08-19 ENCOUNTER — Other Ambulatory Visit: Payer: Self-pay | Admitting: *Deleted

## 2019-08-19 NOTE — Patient Outreach (Signed)
[facility]) Care Management  08/19/2019  Arcilia Aebersold Orrego 04/19/1941 [id number]   RN Health Coach attempted follow up outreach call to patient.  Patient was unavailable. No voicemail message left. No voice mail pickup.  Plan: RN will call patient again within 30 days.  [place] Care Management [phone]

## 2019-09-06 ENCOUNTER — Other Ambulatory Visit: Payer: Self-pay | Admitting: Family Medicine

## 2019-09-18 ENCOUNTER — Other Ambulatory Visit: Payer: Self-pay | Admitting: *Deleted

## 2019-09-18 NOTE — Patient Outreach (Addendum)
[name]) Care Management  [facility] Social Work  09/18/2019  [place] 06-Apr-1941 [id number]  [place] telephone call to patient.  [place] compliance verified. Per patient her fasting blood sugar is 102. Patient is not checking her blood sugars daily. Per patient she is trying to eat more healthy. Patient is not currently exercising. Patient is not needing any assistant devices to walk with. Patient is currently driving and doing well. RN discussed with patient about getting a medical alert system with a GPS. RN discussed with patient the importance of exercising. Patient has agreed to follow up outreach calls.   Encounter Medications:  Outpatient Encounter Medications as of 09/18/2019  Medication Sig Note  . amLODipine (NORVASC) 5 MG tablet Take 1 tablet (5 mg total) by mouth daily. (Patient not taking: Reported on 07/16/2019)   . atorvastatin (LIPITOR) 40 MG tablet TAKE 1 TABLET BY MOUTH EVERY DAY   . blood glucose meter kit and supplies KIT Dispense based on patient and insurance preference. Use up to four times daily as directed. (FOR ICD-9 250.00, 250.01).   . Cholecalciferol (VITAMIN D) 2000 units CAPS Take 1 capsule by mouth daily.   . clopidogrel (PLAVIX) 75 MG tablet Take 1 tablet (75 mg total) by mouth daily.   . clotrimazole (LOTRIMIN AF) 1 % cream Apply 1 application topically 2 (two) times daily. Rash under breast (Patient taking differently: Apply 1 application topically as needed. Rash under breast)   . Colchicine (MITIGARE) 0.6 MG CAPS Take 1 tablet by mouth daily as needed (for gout).   . hydrALAZINE (APRESOLINE) 50 MG tablet TAKE 1 TABLET BY MOUTH TWICE A DAY   . ibuprofen (ADVIL,MOTRIN) 400 MG tablet Take 1 tablet (400 mg total) by mouth every 6 (six) hours as needed. (Patient not taking: Reported on 07/16/2019)   . Insulin Glargine (BASAGLAR KWIKPEN) 100 UNIT/ML SOPN Inject 0.18 mLs (18 Units total) into the skin daily.   . Insulin Pen Needle (B-D  UF III MINI PEN NEEDLES) 31G X 5 MM MISC Inject 1 each as directed at bedtime. Use as directed to inject insulin.  Dx code: E11.8, E11.65   . losartan-hydrochlorothiazide (HYZAAR) 100-12.5 MG tablet Take 1 tablet by mouth daily.   . metoprolol succinate (TOPROL-XL) 25 MG 24 hr tablet Take 1 tablet (25 mg total) by mouth daily.   . nitroGLYCERIN (NITROSTAT) 0.4 MG SL tablet Place 0.4 mg under the tongue every 5 (five) minutes as needed for chest pain. 01/15/2018: Has not needed recently   . ONETOUCH DELICA LANCETS FINE MISC USE U PTO 4 TIMES DAILY AS DIRECTED   . ONETOUCH ULTRA test strip USE UP TO 4 TIMES DAILY AS DIRECTED   . potassium chloride (KLOR-CON) 10 MEQ tablet TAKE 1 TABLET (10 MEQ TOTAL) BY MOUTH EVERY MONDAY, WEDNESDAY, AND FRIDAY.   . sitaGLIPtin (JANUVIA) 50 MG tablet Take 1 tablet (50 mg total) by mouth daily.    No facility-administered encounter medications on file as of 09/18/2019.    Functional Status:  In your present state of health, do you have any difficulty performing the following activities: 03/14/2019 03/03/2019  Hearing? Y N  Comment left ear changes -  Vision? N Y  Comment - -  Difficulty concentrating or making decisions? N N  Walking or climbing stairs? N N  Dressing or bathing? N N  Doing errands, shopping? 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 N  N  Using the Toilet? N N  In the past six months, have you accidently leaked urine? N N  Do you have problems with loss of bowel control? N N  Managing your Medications? N N  Managing your Finances? N N  Housekeeping or managing your Housekeeping? N N  Some recent data might be hidden    Fall/Depression Screening:  PHQ 2/9 Scores 03/14/2019 03/03/2019 09/27/2018 07/29/2018 05/27/2018 03/12/2018 03/06/2018  PHQ - 2 Score 0 0 0 0 0 0 0  PHQ- 9 Score 0 - - - - 0 -   THN CM Care Plan Problem One     Most Recent Value  Care Plan Problem One  Knowledge Deficit in  self management of Diabetes  Role Documenting the Problem One  [place] for Problem One  Active  THN Long Term Goal   Patient will see a decrease in her A1C from 8.2 within the next 90 days  THN CM Short Term Goal #1   Patient will call [place] alert system within the next 30 days  THN CM Short Term Goal #1 Start Date  09/18/19  Interventions for Short Term Goal #1  RN discussed with patient about getting a medical alert system since patient lives alone. RN gave patient the phone number. Patient stated she will follow up with it. RN will follow up to see if patient received.  THN CM Short Term Goal #2   Patient will be able to verbalize foods to choose from on  dine out menus within the next 30 days  Interventions for Short Term Goal #2  Patient is now beginning to eat out. RN reiterated to monitor diet and also watch portion control. RN will send educational material on meal planning and portions. RN will follow up with further discussion  THN CM Short Term Goal #3  Patient will verbalize followin up with health maintenance within the next 30 days  THN CM Short Term Goal #3 Start Date  09/18/19  Interventions for Short Tern Goal #3  RN discussed with patient about getting the COVID vaccine. Patient still has not decided. RN discussed will assist patient with sign up if needed.  RN talked about follow up eye exam since coming up on a year. RN will follow up with further discussion.       Assessment:  Fasting blood sugar is 102 Patient has not received her COVID Vaccine Patient does not have an exercise routine  Plan:  RN sent educational material on Meal Planning and Portion control RN sent Exercise Program activity booklet RN discussed the COVID vaccine and offered to assist patient to sign up RN discussed medical alert system with GPS Patient will call for Medical alert system RN discussed monitoring blood sugars and getting A1C drawn RN discussed keeping  appointments  [place] Management [phone]  RN will follow up within the month of August

## 2019-09-23 ENCOUNTER — Ambulatory Visit: Payer: Medicare Other | Admitting: Family Medicine

## 2019-09-25 ENCOUNTER — Other Ambulatory Visit: Payer: Self-pay

## 2019-09-25 ENCOUNTER — Encounter: Payer: Self-pay | Admitting: Family Medicine

## 2019-09-25 ENCOUNTER — Ambulatory Visit (INDEPENDENT_AMBULATORY_CARE_PROVIDER_SITE_OTHER): Payer: Medicare Other | Admitting: Family Medicine

## 2019-09-25 VITALS — BP 164/88 | HR 65 | Temp 98.2°F | Ht 61.25 in | Wt 205.6 lb

## 2019-09-25 DIAGNOSIS — E1159 Type 2 diabetes mellitus with other circulatory complications: Secondary | ICD-10-CM

## 2019-09-25 DIAGNOSIS — M1A09X0 Idiopathic chronic gout, multiple sites, without tophus (tophi): Secondary | ICD-10-CM

## 2019-09-25 DIAGNOSIS — N289 Disorder of kidney and ureter, unspecified: Secondary | ICD-10-CM

## 2019-09-25 DIAGNOSIS — E118 Type 2 diabetes mellitus with unspecified complications: Secondary | ICD-10-CM | POA: Diagnosis not present

## 2019-09-25 DIAGNOSIS — IMO0002 Reserved for concepts with insufficient information to code with codable children: Secondary | ICD-10-CM

## 2019-09-25 DIAGNOSIS — E1165 Type 2 diabetes mellitus with hyperglycemia: Secondary | ICD-10-CM

## 2019-09-25 DIAGNOSIS — I10 Essential (primary) hypertension: Secondary | ICD-10-CM | POA: Diagnosis not present

## 2019-09-25 DIAGNOSIS — R7989 Other specified abnormal findings of blood chemistry: Secondary | ICD-10-CM

## 2019-09-25 DIAGNOSIS — Z8673 Personal history of transient ischemic attack (TIA), and cerebral infarction without residual deficits: Secondary | ICD-10-CM

## 2019-09-25 LAB — RENAL FUNCTION PANEL
Albumin: 3.7 g/dL (ref 3.5–5.2)
BUN: 19 mg/dL (ref 6–23)
CO2: 30 mEq/L (ref 19–32)
Calcium: 9.3 mg/dL (ref 8.4–10.5)
Chloride: 104 mEq/L (ref 96–112)
Creatinine, Ser: 1.04 mg/dL (ref 0.40–1.20)
GFR: 61.99 mL/min (ref >60.00)
Glucose, Bld: 168 mg/dL — ABNORMAL HIGH (ref 70–99)
Phosphorus: 3 mg/dL (ref 2.3–4.6)
Potassium: 3.9 mEq/L (ref 3.5–5.1)
Sodium: 142 mEq/L (ref 135–145)

## 2019-09-25 LAB — POCT GLYCOSYLATED HEMOGLOBIN (HGB A1C): Hemoglobin A1C: 7.6 % — AB (ref 4.0–5.6)

## 2019-09-25 LAB — T4, FREE: Free T4: 0.93 ng/dL (ref 0.60–1.60)

## 2019-09-25 LAB — TSH: TSH: 4.84 u[IU]/mL — ABNORMAL HIGH (ref 0.35–4.50)

## 2019-09-25 NOTE — Assessment & Plan Note (Signed)
Chronic, slow improvement noted with addition of [place] - continue current regimen.

## 2019-09-25 NOTE — Patient Instructions (Signed)
Blood pressure too high today - take BP meds when you get home, then check BP later today to ensure numbers are improving.  Sugars are better - continue current medicines. Return as needed or in 6 months for physical.

## 2019-09-25 NOTE — Assessment & Plan Note (Addendum)
Noted on last blood work - update TSH, fT4 She has denied hypothyroid symptoms.

## 2019-09-25 NOTE — Assessment & Plan Note (Signed)
H/o CVA 2014. Continue plavix.

## 2019-09-25 NOTE — Assessment & Plan Note (Signed)
Stable period on PRN colchicine.

## 2019-09-25 NOTE — Assessment & Plan Note (Signed)
Chronic, deteriorated. She did not take BP meds yet this morning. Advised take antihypertensives right when she gets home, then recheck BP later today and update [place] if not improving for med titration.

## 2019-09-25 NOTE — Progress Notes (Signed)
This visit was conducted in person.  BP (!) 164/88 (BP Location: Right Arm, Patient Position: Sitting, Cuff Size: Large)   Pulse 65   Temp 98.2 F (36.8 C) (Temporal)   Ht 5' 1.25" (1.556 m)   Wt 205 lb 9 oz (93.2 kg)   SpO2 98%   BMI 38.52 kg/m   Markedly elevated on repeat   CC: 6 om DM f/u visit  Subjective:    Patient ID: Kristen [name], female    DOB: 10-02-1940, 79 y.o.   MRN: [id number]  HPI: Kristen [name] is a 79 y.o. female presenting on 09/25/2019 for Diabetes (Here for 6 mo f/u.)   Gout - managed with PRN colchicine. Had episode of gout in L hand last month, did well with colchicine.   HTN - Compliant with current antihypertensive regimen of amlodipine 81m daily, hydralazine 592mbid, hyzaar 100/12.95m71maily, toprol XL 295m23mily.  Does occasionally check blood pressures at home: pretty good control at home.  No low blood pressure readings or symptoms of dizziness/syncope.  Denies HA, vision changes, CP/tightness, SOB, leg swelling.    DM - does regularly check sugars - pretty good highest 237 pm, lowest 119. Compliant with antihyperglycemic regimen which includes: basaglar 18u daily, januvia 50mg9mly. Denies low sugars or hypoglycemic symptoms. Denies paresthesias. Last diabetic eye exam 09/2018. Pneumovax: 2016. Prevnar: 2015. Glucometer brand: one-touch. DSME: declined. Lab Results  Component Value Date   HGBA1C 7.6 (A) 09/25/2019   Diabetic Foot Exam - Simple   Simple Foot Form Diabetic Foot exam was performed with the following findings: Yes 09/25/2019 10:43 AM  Visual Inspection No deformities, no ulcerations, no other skin breakdown bilaterally: Yes Sensation Testing Intact to touch and monofilament testing bilaterally: Yes Pulse Check Posterior Tibialis and Dorsalis pulse intact bilaterally: Yes Comments    Lab Results  Component Value Date   MICROALBUR 7.8 (H) 08/09/2016       Relevant past medical, surgical, family and social history reviewed  and updated as indicated. Interim medical history since our last visit reviewed. Allergies and medications reviewed and updated. Outpatient Medications Prior to Visit  Medication Sig Dispense Refill  . amLODipine (NORVASC) 5 MG tablet Take 1 tablet (5 mg total) by mouth daily. 90 tablet 3  . atorvastatin (LIPITOR) 40 MG tablet TAKE 1 TABLET BY MOUTH EVERY DAY 90 tablet 2  . blood glucose meter kit and supplies KIT Dispense based on patient and insurance preference. Use up to four times daily as directed. (FOR ICD-9 250.00, 250.01). 1 each 0  . Cholecalciferol (VITAMIN D) 2000 units CAPS Take 1 capsule by mouth daily.    . clopidogrel (PLAVIX) 75 MG tablet Take 1 tablet (75 mg total) by mouth daily. 90 tablet 3  . clotrimazole (LOTRIMIN AF) 1 % cream Apply 1 application topically 2 (two) times daily. Rash under breast (Patient taking differently: Apply 1 application topically as needed. Rash under breast) 45 g 1  . Colchicine (MITIGARE) 0.6 MG CAPS Take 1 tablet by mouth daily as needed (for gout). 30 capsule 3  . hydrALAZINE (APRESOLINE) 50 MG tablet TAKE 1 TABLET BY MOUTH TWICE A DAY 180 tablet 1  . ibuprofen (ADVIL,MOTRIN) 400 MG tablet Take 1 tablet (400 mg total) by mouth every 6 (six) hours as needed. 30 tablet 0  . Insulin Glargine (BASAGLAR KWIKPEN) 100 UNIT/ML SOPN Inject 0.18 mLs (18 Units total) into the skin daily.    . Insulin Pen Needle (B-D UF III MINI PEN  NEEDLES) 31G X 5 MM MISC Inject 1 each as directed at bedtime. Use as directed to inject insulin.  Dx code: E11.8, E11.65 100 each 1  . losartan-hydrochlorothiazide (HYZAAR) 100-12.5 MG tablet Take 1 tablet by mouth daily. 90 tablet 3  . metoprolol succinate (TOPROL-XL) 25 MG 24 hr tablet Take 1 tablet (25 mg total) by mouth daily. 90 tablet 3  . nitroGLYCERIN (NITROSTAT) 0.4 MG SL tablet Place 0.4 mg under the tongue every 5 (five) minutes as needed for chest pain.    [name] DELICA LANCETS FINE MISC USE U PTO 4 TIMES DAILY AS  DIRECTED 100 each 2  . ONETOUCH ULTRA test strip USE UP TO 4 TIMES DAILY AS DIRECTED 100 strip 3  . potassium chloride (KLOR-CON) 10 MEQ tablet TAKE 1 TABLET (10 MEQ TOTAL) BY MOUTH EVERY MONDAY, WEDNESDAY, AND FRIDAY. 40 tablet 0  . sitaGLIPtin (JANUVIA) 50 MG tablet Take 1 tablet (50 mg total) by mouth daily. 90 tablet 3   No facility-administered medications prior to visit.     Per HPI unless specifically indicated in ROS section below Review of Systems Objective:  BP (!) 164/88 (BP Location: Right Arm, Patient Position: Sitting, Cuff Size: Large)   Pulse 65   Temp 98.2 F (36.8 C) (Temporal)   Ht 5' 1.25" (1.556 m)   Wt 205 lb 9 oz (93.2 kg)   SpO2 98%   BMI 38.52 kg/m   Wt Readings from Last 3 Encounters:  09/25/19 205 lb 9 oz (93.2 kg)  03/24/19 208 lb 4 oz (94.5 kg)  03/11/19 207 lb (93.9 kg)      Physical Exam Vitals and nursing note reviewed.  Constitutional:      General: She is not in acute distress.    Appearance: Normal appearance. She is well-developed. She is obese. She is not ill-appearing.  Eyes:     General: No scleral icterus.    Extraocular Movements: Extraocular movements intact.     Conjunctiva/sclera: Conjunctivae normal.     Pupils: Pupils are equal, round, and reactive to light.  Cardiovascular:     Rate and Rhythm: Normal rate and regular rhythm.     Pulses: Normal pulses.     Heart sounds: Normal heart sounds. No murmur.  Pulmonary:     Effort: Pulmonary effort is normal. No respiratory distress.     Breath sounds: Normal breath sounds. No wheezing, rhonchi or rales.  Musculoskeletal:     Cervical back: Normal range of motion and neck supple.     Comments: See HPI for foot exam if done  Lymphadenopathy:     Cervical: No cervical adenopathy.  Skin:    General: Skin is warm and dry.     Findings: No rash.  Neurological:     Mental Status: She is alert.       Results for orders placed or performed in visit on 09/25/19  POCT glycosylated  hemoglobin (Hb A1C)  Result Value Ref Range   Hemoglobin A1C 7.6 (A) 4.0 - 5.6 %   HbA1c POC (<> result, manual entry)     HbA1c, POC (prediabetic range)     HbA1c, POC (controlled diabetic range)     Assessment & Plan:  This visit occurred during the SARS-CoV-2 public health emergency.  Safety protocols were in place, including screening questions prior to the visit, additional usage of staff PPE, and extensive cleaning of exam room while observing appropriate contact time as indicated for disinfecting solutions.   Problem List Items  Addressed This Visit    Type 2 diabetes mellitus with vascular disease ([place]) - Primary   Relevant Orders   POCT glycosylated hemoglobin (Hb A1C) (Completed)   Renal insufficiency   Relevant Orders   Renal function panel   History of CVA (cerebrovascular accident)    H/o CVA 2014. Continue plavix.       Gout    Stable period on PRN colchicine.       Essential hypertension    Chronic, deteriorated. She did not take BP meds yet this morning. Advised take antihypertensives right when she gets home, then recheck BP later today and update [place] if not improving for med titration.       Diabetes mellitus type 2, uncontrolled, with complications (HCC)    Chronic, slow improvement noted with addition of januvia - continue current regimen.       Abnormal TSH    Noted on last blood work - update TSH, fT4 She has denied hypothyroid symptoms.       Relevant Orders   TSH   T4, free       No orders of the defined types were placed in this encounter.  Orders Placed This Encounter  Procedures  . TSH  . T4, free  . Renal function panel  . POCT glycosylated hemoglobin (Hb A1C)    Patient Instructions  Blood pressure too high today - take BP meds when you get home, then check BP later today to ensure numbers are improving.  Sugars are better - continue current medicines. Return as needed or in 6 months for physical.     Follow up plan: Return in  about 6 months (around 03/26/2020) for annual exam, prior fasting for blood work, medicare wellness visit.  [name], MD

## 2019-10-01 ENCOUNTER — Telehealth: Payer: Self-pay

## 2019-10-01 NOTE — Telephone Encounter (Signed)
Patient aware of results and recommendations. °

## 2019-10-01 NOTE — Telephone Encounter (Signed)
-----   Message from [name], MD sent at 09/30/2019  5:26 PM EDT ----- Nothing to do just monitor for low thyroid symptoms.  Ensure not overeating soy.

## 2019-11-11 ENCOUNTER — Ambulatory Visit: Payer: Medicare Other

## 2019-12-02 ENCOUNTER — Other Ambulatory Visit: Payer: Self-pay | Admitting: Family Medicine

## 2019-12-19 ENCOUNTER — Other Ambulatory Visit: Payer: Self-pay | Admitting: *Deleted

## 2019-12-19 NOTE — Patient Outreach (Signed)
[facility]) Care Management  [place]  12/19/2019   Kristen [name] 1940/05/12 [id number]  RN Health Coach telephone call to patient.  Hipaa compliance verified. Per patient fasting blood sugar is 137. Patient has not been exercising as before. Patient is trying to adhere to diet. Patient is not having any hyper or hypoglycemia reactions. Patient is not having any pain. Per patient she is taking all medications as per ordered. Patient has agreed to follow up outreach calls.   Encounter Medications:  Outpatient Encounter Medications as of 12/19/2019  Medication Sig Note  . amLODipine (NORVASC) 5 MG tablet Take 1 tablet (5 mg total) by mouth daily.   Marland Kitchen atorvastatin (LIPITOR) 40 MG tablet TAKE 1 TABLET BY MOUTH EVERY DAY   . blood glucose meter kit and supplies KIT Dispense based on patient and insurance preference. Use up to four times daily as directed. (FOR ICD-9 250.00, 250.01).   . Cholecalciferol (VITAMIN D) 2000 units CAPS Take 1 capsule by mouth daily.   . clopidogrel (PLAVIX) 75 MG tablet Take 1 tablet (75 mg total) by mouth daily.   . clotrimazole (LOTRIMIN AF) 1 % cream Apply 1 application topically 2 (two) times daily. Rash under breast (Patient taking differently: Apply 1 application topically as needed. Rash under breast)   . Colchicine (MITIGARE) 0.6 MG CAPS Take 1 tablet by mouth daily as needed (for gout).   . hydrALAZINE (APRESOLINE) 50 MG tablet TAKE 1 TABLET BY MOUTH TWICE A DAY   . ibuprofen (ADVIL,MOTRIN) 400 MG tablet Take 1 tablet (400 mg total) by mouth every 6 (six) hours as needed.   . Insulin Glargine (BASAGLAR KWIKPEN) 100 UNIT/ML SOPN Inject 0.18 mLs (18 Units total) into the skin daily.   . Insulin Pen Needle (B-D UF III MINI PEN NEEDLES) 31G X 5 MM MISC Inject 1 each as directed at bedtime. Use as directed to inject insulin.  Dx code: E11.8, E11.65   . losartan-hydrochlorothiazide (HYZAAR) 100-12.5 MG tablet Take 1 tablet by mouth daily.    . metoprolol succinate (TOPROL-XL) 25 MG 24 hr tablet Take 1 tablet (25 mg total) by mouth daily.   . nitroGLYCERIN (NITROSTAT) 0.4 MG SL tablet Place 0.4 mg under the tongue every 5 (five) minutes as needed for chest pain. 01/15/2018: Has not needed recently   . ONETOUCH DELICA LANCETS FINE MISC USE U PTO 4 TIMES DAILY AS DIRECTED   . ONETOUCH ULTRA test strip USE UP TO 4 TIMES DAILY AS DIRECTED   . potassium chloride (KLOR-CON) 10 MEQ tablet TAKE 1 TABLET (10 MEQ TOTAL) BY MOUTH EVERY MONDAY, WEDNESDAY, AND FRIDAY.   . sitaGLIPtin (JANUVIA) 50 MG tablet Take 1 tablet (50 mg total) by mouth daily.    No facility-administered encounter medications on file as of 12/19/2019.    Functional Status:  In your present state of health, do you have any difficulty performing the following activities: 03/14/2019 03/03/2019  Hearing? Y N  Comment left ear changes -  Vision? N Y  Difficulty concentrating or making decisions? N N  Walking or climbing stairs? N N  Dressing or bathing? N N  Doing errands, shopping? 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 N N  Using the Toilet? N N  In the past six months, have you accidently leaked urine? N N  Do you have problems with loss of bowel control? N N  Managing your Medications? N  N  Managing your Finances? N N  Housekeeping or managing your Housekeeping? N N  Some recent data might be hidden    Fall/Depression Screening: Fall Risk  12/19/2019 05/22/2019 03/14/2019  Falls in the past year? _0 Comment - - tripped on rug  Number falls in past yr: 1 1 0  Comment - - -  Injury with Fall? 0 0 0  Risk Factor Category  - - -  Risk for fall due to : History of fall(s);Impaired balance/gait;Impaired mobility History of fall(s);Impaired balance/gait;Impaired mobility Medication side effect  Risk for fall due to: Comment - - -  Follow up Falls evaluation completed;Falls prevention discussed Falls  evaluation completed;Falls prevention discussed Falls evaluation completed;Falls prevention discussed   PHQ 2/9 Scores 03/14/2019 03/03/2019 09/27/2018 07/29/2018 05/27/2018 03/12/2018 03/06/2018  PHQ - 2 Score 0 0 0 0 0 0 0  PHQ- 9 Score 0 - - - - 0 -   Goals Addressed              This Visit's Progress   .  Diabetic A1C less than 8.2 (pt-stated)        CARE PLAN ENTRY (see longtitudinal plan of care for additional care plan information)  Objective:  Lab Results  Component Value Date   HGBA1C 7.6 (A) 09/25/2019 .   Lab Results  Component Value Date   CREATININE 1.04 09/25/2019   CREATININE 1.15 03/24/2019   CREATININE 1.15 07/02/2018 .   Marland Kitchen No results found for: EGFR  Current Barriers:  Marland Kitchen Knowledge Deficits related to basic Diabetes pathophysiology and self care/management . Behavior modification  Case Manager Clinical Goal(s):  Over the next 90 days, patient will demonstrate improved adherence to prescribed treatment plan for diabetes self care/management as evidenced by:  Marland Kitchen Verbalize daily monitoring and recording of CBG within 90 days . Verbalize adherence to ADA/ carb modified diet within the next 90 days . Verbalize exercise 2 days/week . Verbalize adherence to prescribed medication regimen within the next 90 days   Interventions:  . Provided education to patient about basic DM disease process . Reviewed medications with patient and discussed importance of medication adherence . Discussed plans with patient for ongoing care management follow up and provided patient with direct contact information for care management team . Reviewed scheduled/upcoming provider appointments including: Eye exam, Flu vaccine and COVID vaccination . Advised patient, providing education and rationale, to check cbg as per Dr ordered and record, calling PCP for findings outside established parameters.    Patient Self Care Activities:  . Attends all scheduled provider appointments . Checks  blood sugars as prescribed and utilize hyper and hypoglycemia protocol as needed . Adheres to prescribed ADA/carb modified  Initial goal documentation         Assessment:  Fasting blood sugar 137 A1C 8.2 Patient has not had a recent eye exam Patient has not received COVID vaccine Plan:  Patient will schedule eye exam before next outreach RN discussed with patient about getting the COVID vaccine RN discussed with patient about the flu shot RN discussed monitoring CBG as per dr ordered RN will follow up within the month of October  Maddy Graham [place] [facility] [phone]

## 2020-01-29 ENCOUNTER — Other Ambulatory Visit: Payer: Self-pay | Admitting: Family Medicine

## 2020-02-02 ENCOUNTER — Other Ambulatory Visit: Payer: Self-pay | Admitting: *Deleted

## 2020-02-02 NOTE — Patient Outreach (Signed)
[facility]) Care Management  [place]  02/02/2020   Kristen [name] 1941-04-04 [id number]  [place] telephone call to patient.  Hipaa compliance verified. Patient had not checked her blood sugar today. A1C is 7.6. Patient has not gotten COVID vaccine and does not take Flu shots. Patient has not scheduled a recent eye exam. Patient is monitoring weight and diet. Patient is not using any devices to ambulate. She has not had any recent falls. Patient agreed to further outreach calls   Encounter Medications:  Outpatient Encounter Medications as of 02/02/2020  Medication Sig Note  . amLODipine (NORVASC) 5 MG tablet Take 1 tablet (5 mg total) by mouth daily.   Marland Kitchen atorvastatin (LIPITOR) 40 MG tablet TAKE 1 TABLET BY MOUTH EVERY DAY   . blood glucose meter kit and supplies KIT Dispense based on patient and insurance preference. Use up to four times daily as directed. (FOR ICD-9 250.00, 250.01).   . Cholecalciferol (VITAMIN D) 2000 units CAPS Take 1 capsule by mouth daily.   . clopidogrel (PLAVIX) 75 MG tablet Take 1 tablet (75 mg total) by mouth daily.   . clotrimazole (LOTRIMIN AF) 1 % cream Apply 1 application topically 2 (two) times daily. Rash under breast (Patient taking differently: Apply 1 application topically as needed. Rash under breast)   . Colchicine (MITIGARE) 0.6 MG CAPS Take 1 tablet by mouth daily as needed (for gout).   . hydrALAZINE (APRESOLINE) 50 MG tablet TAKE 1 TABLET BY MOUTH TWICE A DAY   . ibuprofen (ADVIL,MOTRIN) 400 MG tablet Take 1 tablet (400 mg total) by mouth every 6 (six) hours as needed.   . Insulin Glargine (BASAGLAR KWIKPEN) 100 UNIT/ML SOPN Inject 0.18 mLs (18 Units total) into the skin daily.   . Insulin Pen Needle (B-D UF III MINI PEN NEEDLES) 31G X 5 MM MISC Inject 1 each as directed at bedtime. Use as directed to inject insulin.  Dx code: E11.8, E11.65   . losartan-hydrochlorothiazide (HYZAAR) 100-12.5 MG tablet Take 1 tablet  by mouth daily.   . metoprolol succinate (TOPROL-XL) 25 MG 24 hr tablet TAKE 1 TABLET BY MOUTH EVERY DAY   . nitroGLYCERIN (NITROSTAT) 0.4 MG SL tablet Place 0.4 mg under the tongue every 5 (five) minutes as needed for chest pain. 01/15/2018: Has not needed recently   . ONETOUCH DELICA LANCETS FINE MISC USE U PTO 4 TIMES DAILY AS DIRECTED   . ONETOUCH ULTRA test strip USE UP TO 4 TIMES DAILY AS DIRECTED   . potassium chloride (KLOR-CON) 10 MEQ tablet TAKE 1 TABLET (10 MEQ TOTAL) BY MOUTH EVERY MONDAY, WEDNESDAY, AND FRIDAY.   . sitaGLIPtin (JANUVIA) 50 MG tablet Take 1 tablet (50 mg total) by mouth daily.    No facility-administered encounter medications on file as of 02/02/2020.    Functional Status:  In your present state of health, do you have any difficulty performing the following activities: 03/14/2019 03/03/2019  Hearing? Y N  Comment left ear changes -  Vision? N Y  Difficulty concentrating or making decisions? N N  Walking or climbing stairs? N N  Dressing or bathing? N N  Doing errands, shopping? 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 N N  Using the Toilet? N N  In the past six months, have you accidently leaked urine? N N  Do you have problems with loss of bowel control? N N  Managing your  Medications? N N  Managing your Finances? N N  Housekeeping or managing your Housekeeping? N N  Some recent data might be hidden    Fall/Depression Screening: Fall Risk  02/02/2020 12/19/2019 05/22/2019  Falls in the past year? '1 1 1  ' Comment - - -  Number falls in past yr: '1 1 1  ' Comment - - -  Injury with Fall? 0 0 0  Risk Factor Category  - - -  Risk for fall due to : History of fall(s);Impaired balance/gait;Impaired mobility History of fall(s);Impaired balance/gait;Impaired mobility History of fall(s);Impaired balance/gait;Impaired mobility  Risk for fall due to: Comment - - -  Follow up Falls evaluation completed;Falls  prevention discussed Falls evaluation completed;Falls prevention discussed Falls evaluation completed;Falls prevention discussed   PHQ 2/9 Scores 03/14/2019 03/03/2019 09/27/2018 07/29/2018 05/27/2018 03/12/2018 03/06/2018  PHQ - 2 Score 0 0 0 0 0 0 0  PHQ- 9 Score 0 - - - - 0 -    Assessment:  Goals Addressed            This Visit's Progress   . Learn More About My Health       Follow Up Date [id number]   - make a list of questions - ask questions - bring a list of my medicines to the visit - speak up when I don't understand    Why is this important?   The best way to learn about your health and care is by talking to the doctor and nurse.  They will answer your questions and give you information in the way that you like best.    Notes:     Marland Kitchen Make and Keep All Appointments       Follow Up Date [id number]   - call to cancel if needed - keep a calendar with prescription refill dates - keep a calendar with appointment dates    Why is this important?   Part of staying healthy is seeing the doctor for follow-up care.  If you forget your appointments, there are some things you can do to stay on track.    Notes:     Marland Kitchen Matintain My Quality of Life       Follow Up Date [id number]   - discuss my treatment options with the doctor or nurse    Why is this important?   Having a long-term illness can be scary.  It can also be stressful for you and your caregiver.  These steps may help.    Notes:     Marland Kitchen Monitor and Manage My Blood Sugar       Follow Up Date [id number]   - check blood sugar at prescribed times - check blood sugar if I feel it is too high or too low - enter blood sugar readings and medication or insulin into daily log - take the blood sugar log to all doctor visits - take the blood sugar meter to all doctor visits    Why is this important?   Checking your blood sugar at home helps to keep it from getting very high or very low.  Writing the results in a diary or log  helps the doctor know how to care for you.  Your blood sugar log should have the time, date and the results.  Also, write down the amount of insulin or other medicine that you take.  Other information, like what you ate, exercise done and how you were feeling, will also be helpful.  Notes:     Marland Kitchen Obtain Eye Exam       Follow Up Date [id number]   - schedule appointment with eye doctor    Why is this important?   Eye check-ups are important when you have diabetes.  Vision loss can be prevented.    Notes:     . Perform Foot Care       Follow Up Date [id number]   - check feet daily for cuts, sores or redness - keep feet up while sitting - trim toenails straight across - wash and dry feet carefully every day - wear comfortable, cotton socks - wear comfortable, well-fitting shoes    Why is this important?   Good foot care is very important when you have diabetes.  There are many things you can do to keep your feet healthy and catch a problem early.    Notes:     . Set My Target A1C       Follow Up Date [id number]   - set target A1C    Why is this important?   Your target A1C is decided together by you and your doctor.  It is based on several things like your age and other health issues.    Notes:        Plan:  Discussed healthy holiday eating Patient will schedule eye exam today Patient is currently;y still thinking about getting COVID vaccine Discussed monitoring blood sugars daily RN will follow up within the month of December  Kristen [name] [place] [facility] [phone]

## 2020-02-03 ENCOUNTER — Other Ambulatory Visit: Payer: Self-pay | Admitting: Family Medicine

## 2020-02-05 ENCOUNTER — Other Ambulatory Visit: Payer: Self-pay | Admitting: Family Medicine

## 2020-02-07 NOTE — Telephone Encounter (Signed)
Per Kristen [name] at pharmacy, she is getting Lantus.

## 2020-02-12 ENCOUNTER — Telehealth: Payer: Self-pay

## 2020-02-12 ENCOUNTER — Other Ambulatory Visit: Payer: Self-pay | Admitting: Family Medicine

## 2020-02-12 NOTE — Telephone Encounter (Signed)
Received letter from [facility] stating pt's enrollment period ends on 04/23/2020.  Says same letter was sent to pt.  Spoke with pt asking if she received the re-enrollment form.  States her nurse says is helping her with it and she will drop it off at our office.

## 2020-02-16 ENCOUNTER — Other Ambulatory Visit: Payer: Self-pay | Admitting: *Deleted

## 2020-02-16 NOTE — Patient Outreach (Signed)
Referral from Johny Shock, RN to [email or web address] for medication assistance.

## 2020-02-16 NOTE — Patient Outreach (Signed)
[facility]) Care Management  02/16/2020  Kristen [name] [birth year] [id number]   RN Health Coach telephone call to patient.  Hipaa compliance verified. Patient is calling to get the renewal application form from pharmacy for renewal of basaglar before expiration.  Plan: Sent referral to pharmacy.

## 2020-03-05 ENCOUNTER — Telehealth: Payer: Self-pay | Admitting: Family Medicine

## 2020-03-05 MED ORDER — CLOPIDOGREL BISULFATE 75 MG PO TABS: 75.0000 mg | ORAL_TABLET | Freq: Every day | ORAL | 3 refills | Status: DC

## 2020-03-05 MED ORDER — BD PEN NEEDLE MINI U/F 31G X 5 MM MISC: 0 refills | Status: DC

## 2020-03-05 MED ORDER — LOSARTAN POTASSIUM-HCTZ 100-12.5 MG PO TABS: 1.0000 | ORAL_TABLET | Freq: Every day | ORAL | 0 refills | Status: DC

## 2020-03-05 MED ORDER — POTASSIUM CHLORIDE ER 10 MEQ PO TBCR: 10.0000 meq | EXTENDED_RELEASE_TABLET | ORAL | 0 refills | Status: DC

## 2020-03-05 NOTE — Telephone Encounter (Signed)
Plavix Last rx: 07/02/18, #90 Last OV:  09/25/19, 6 mo DM f/u Next OV: 04/07/20, AWV prt 2  E-scribed other requested refills.

## 2020-03-05 NOTE — Telephone Encounter (Signed)
PT CALLED IN WANTED TO KNOW ABOUT REFILL MINI PEN NEEDLES AND THE CLOPIDOGREL (75MG ) ,  (KLOR-CON) (HYZAAR)

## 2020-03-09 ENCOUNTER — Other Ambulatory Visit: Payer: Self-pay | Admitting: *Deleted

## 2020-03-09 NOTE — Patient Outreach (Signed)
[facility]) Care Management  03/09/2020  Kristen [name] [birth year] [id number]   RN Health Coach telephone call to patient.  Hipaa compliance verified. Per patient she received the renewal form. Patient stated that she needs help to fill it out.  Plan: Referred to pharmacist  [place] [facility] [phone]

## 2020-03-09 NOTE — Patient Outreach (Signed)
Referral from Johny Shock, RN to [employer]:  [email or web address] for medication assistance.

## 2020-03-11 DIAGNOSIS — H5789 Other specified disorders of eye and adnexa: Secondary | ICD-10-CM | POA: Diagnosis not present

## 2020-03-15 ENCOUNTER — Emergency Department (HOSPITAL_COMMUNITY): Payer: Medicare Other

## 2020-03-15 ENCOUNTER — Inpatient Hospital Stay (HOSPITAL_COMMUNITY)
Admission: EM | Admit: 2020-03-15 | Discharge: 2020-03-19 | DRG: 305 | Disposition: A | Discharge status: Home / Self Care | Payer: Medicare Other | Attending: Internal Medicine | Admitting: Internal Medicine

## 2020-03-15 ENCOUNTER — Other Ambulatory Visit: Payer: Self-pay

## 2020-03-15 DIAGNOSIS — E1165 Type 2 diabetes mellitus with hyperglycemia: Secondary | ICD-10-CM | POA: Diagnosis not present

## 2020-03-15 DIAGNOSIS — R079 Chest pain, unspecified: Secondary | ICD-10-CM

## 2020-03-15 DIAGNOSIS — Z79899 Other long term (current) drug therapy: Secondary | ICD-10-CM | POA: Diagnosis not present

## 2020-03-15 DIAGNOSIS — I129 Hypertensive chronic kidney disease with stage 1 through stage 4 chronic kidney disease, or unspecified chronic kidney disease: Secondary | ICD-10-CM | POA: Diagnosis present

## 2020-03-15 DIAGNOSIS — Z6835 Body mass index (BMI) 35.0-35.9, adult: Secondary | ICD-10-CM | POA: Diagnosis not present

## 2020-03-15 DIAGNOSIS — I160 Hypertensive urgency: Secondary | ICD-10-CM | POA: Diagnosis not present

## 2020-03-15 DIAGNOSIS — Z8673 Personal history of transient ischemic attack (TIA), and cerebral infarction without residual deficits: Secondary | ICD-10-CM | POA: Diagnosis not present

## 2020-03-15 DIAGNOSIS — R9431 Abnormal electrocardiogram [ECG] [EKG]: Secondary | ICD-10-CM

## 2020-03-15 DIAGNOSIS — Z809 Family history of malignant neoplasm, unspecified: Secondary | ICD-10-CM | POA: Diagnosis not present

## 2020-03-15 DIAGNOSIS — R519 Headache, unspecified: Secondary | ICD-10-CM | POA: Diagnosis not present

## 2020-03-15 DIAGNOSIS — I2510 Atherosclerotic heart disease of native coronary artery without angina pectoris: Secondary | ICD-10-CM | POA: Diagnosis not present

## 2020-03-15 DIAGNOSIS — Z90711 Acquired absence of uterus with remaining cervical stump: Secondary | ICD-10-CM

## 2020-03-15 DIAGNOSIS — Z880 Allergy status to penicillin: Secondary | ICD-10-CM | POA: Diagnosis not present

## 2020-03-15 DIAGNOSIS — I252 Old myocardial infarction: Secondary | ICD-10-CM

## 2020-03-15 DIAGNOSIS — N1831 Chronic kidney disease, stage 3a: Secondary | ICD-10-CM | POA: Diagnosis present

## 2020-03-15 DIAGNOSIS — Z833 Family history of diabetes mellitus: Secondary | ICD-10-CM

## 2020-03-15 DIAGNOSIS — E785 Hyperlipidemia, unspecified: Secondary | ICD-10-CM | POA: Diagnosis not present

## 2020-03-15 DIAGNOSIS — Z888 Allergy status to other drugs, medicaments and biological substances status: Secondary | ICD-10-CM | POA: Diagnosis not present

## 2020-03-15 DIAGNOSIS — Z881 Allergy status to other antibiotic agents status: Secondary | ICD-10-CM

## 2020-03-15 DIAGNOSIS — Z20822 Contact with and (suspected) exposure to covid-19: Secondary | ICD-10-CM | POA: Diagnosis present

## 2020-03-15 DIAGNOSIS — Z96653 Presence of artificial knee joint, bilateral: Secondary | ICD-10-CM | POA: Diagnosis present

## 2020-03-15 DIAGNOSIS — I700 Atherosclerosis of aorta: Secondary | ICD-10-CM | POA: Diagnosis present

## 2020-03-15 DIAGNOSIS — I619 Nontraumatic intracerebral hemorrhage, unspecified: Secondary | ICD-10-CM | POA: Diagnosis not present

## 2020-03-15 DIAGNOSIS — R4182 Altered mental status, unspecified: Secondary | ICD-10-CM | POA: Diagnosis present

## 2020-03-15 DIAGNOSIS — Z7902 Long term (current) use of antithrombotics/antiplatelets: Secondary | ICD-10-CM

## 2020-03-15 DIAGNOSIS — E1122 Type 2 diabetes mellitus with diabetic chronic kidney disease: Secondary | ICD-10-CM | POA: Diagnosis not present

## 2020-03-15 DIAGNOSIS — Z86711 Personal history of pulmonary embolism: Secondary | ICD-10-CM | POA: Diagnosis not present

## 2020-03-15 DIAGNOSIS — K219 Gastro-esophageal reflux disease without esophagitis: Secondary | ICD-10-CM | POA: Diagnosis present

## 2020-03-15 DIAGNOSIS — I161 Hypertensive emergency: Secondary | ICD-10-CM | POA: Diagnosis not present

## 2020-03-15 DIAGNOSIS — Z8249 Family history of ischemic heart disease and other diseases of the circulatory system: Secondary | ICD-10-CM

## 2020-03-15 DIAGNOSIS — Z886 Allergy status to analgesic agent status: Secondary | ICD-10-CM

## 2020-03-15 DIAGNOSIS — E669 Obesity, unspecified: Secondary | ICD-10-CM | POA: Diagnosis present

## 2020-03-15 DIAGNOSIS — Z823 Family history of stroke: Secondary | ICD-10-CM

## 2020-03-15 DIAGNOSIS — I10 Essential (primary) hypertension: Secondary | ICD-10-CM | POA: Diagnosis present

## 2020-03-15 LAB — COMPREHENSIVE METABOLIC PANEL
ALT: 17 U/L (ref 0–44)
AST: 16 U/L (ref 15–41)
Albumin: 3.3 g/dL — ABNORMAL LOW (ref 3.5–5.0)
Alkaline Phosphatase: 88 U/L (ref 38–126)
Anion gap: 8 (ref 5–15)
BUN: 17 mg/dL (ref 8–23)
CO2: 28 mmol/L (ref 22–32)
Calcium: 8.8 mg/dL — ABNORMAL LOW (ref 8.9–10.3)
Chloride: 98 mmol/L (ref 98–111)
Creatinine, Ser: 1.19 mg/dL — ABNORMAL HIGH (ref 0.44–1.00)
GFR, Estimated: 47 mL/min — ABNORMAL LOW (ref >60)
Glucose, Bld: 367 mg/dL — ABNORMAL HIGH (ref 70–99)
Potassium: 3.6 mmol/L (ref 3.5–5.1)
Sodium: 134 mmol/L — ABNORMAL LOW (ref 135–145)
Total Bilirubin: 0.8 mg/dL (ref 0.3–1.2)
Total Protein: 6.7 g/dL (ref 6.5–8.1)

## 2020-03-15 LAB — DIFFERENTIAL
Abs Immature Granulocytes: 0.01 10*3/uL (ref 0.00–0.07)
Basophils Absolute: 0 10*3/uL (ref 0.0–0.1)
Basophils Relative: 0 %
Eosinophils Absolute: 0 10*3/uL (ref 0.0–0.5)
Eosinophils Relative: 1 %
Immature Granulocytes: 0 %
Lymphocytes Relative: 28 %
Lymphs Abs: 2.2 10*3/uL (ref 0.7–4.0)
Monocytes Absolute: 0.5 10*3/uL (ref 0.1–1.0)
Monocytes Relative: 6 %
Neutro Abs: 5.1 10*3/uL (ref 1.7–7.7)
Neutrophils Relative %: 65 %

## 2020-03-15 LAB — CBC
HCT: 38.9 % (ref 36.0–46.0)
Hemoglobin: 12 g/dL (ref 12.0–15.0)
MCH: 27.2 pg (ref 26.0–34.0)
MCHC: 30.8 g/dL (ref 30.0–36.0)
MCV: 88.2 fL (ref 80.0–100.0)
Platelets: 212 10*3/uL (ref 150–400)
RBC: 4.41 MIL/uL (ref 3.87–5.11)
RDW: 14.7 % (ref 11.5–15.5)
WBC: 7.8 10*3/uL (ref 4.0–10.5)
nRBC: 0 % (ref 0.0–0.2)

## 2020-03-15 LAB — CBG MONITORING, ED
Glucose-Capillary: 329 mg/dL — ABNORMAL HIGH (ref 70–99)
Glucose-Capillary: 283 mg/dL — ABNORMAL HIGH (ref 70–99)
Glucose-Capillary: 253 mg/dL — ABNORMAL HIGH (ref 70–99)

## 2020-03-15 LAB — PROTIME-INR
INR: 1.1 (ref 0.8–1.2)
Prothrombin Time: 13.6 seconds (ref 11.4–15.2)

## 2020-03-15 LAB — APTT: aPTT: 27 seconds (ref 24–36)

## 2020-03-15 LAB — TROPONIN I (HIGH SENSITIVITY): Troponin I (High Sensitivity): 24 ng/L — ABNORMAL HIGH (ref <18)

## 2020-03-15 MED ORDER — SODIUM CHLORIDE 0.9% FLUSH
3.0000 mL | Freq: Once | INTRAVENOUS | Status: AC
Start: 2020-03-15 — End: 2020-03-16
  Administered 2020-03-16: 3 mL via INTRAVENOUS

## 2020-03-15 NOTE — ED Provider Notes (Signed)
[place] EMERGENCY DEPARTMENT Provider Note   CSN: [id number] Arrival date & time: 03/15/20  1530     History Chief Complaint  Patient presents with  . Headache    Kristen [name] is a 79 y.o. female.  The history is provided by the patient and medical records.   79 year old female with history of coronary artery disease, diabetes, frequent headaches, hypertension, hyperlipidemia, obesity, prior MI, prior stroke without residual deficit, presenting to the ED with headaches and numbness.  Patient states this began Thursday evening and was intermittent until about 24 hours ago but has been constant since that time.  Described as a throbbing sensation in her bilateral temples, they are tender to the touch.  States she is noticing issues with her right leg.  States she has had some numbness in this leg but has not seemed as bad today as it was before, however she is having trouble walking currently.  States she feels like her right leg does not move correctly when she tries to step forward which is new.  She has not had any falls because of this.  She does not use cane or walker currently.  Also states she has started to have some trouble with her vision.  She went to her eye doctor a few days ago who did evaluation and told her eyes were dry.  She has been using rewetting drops since that time with some improvement.  Past Medical History:  Diagnosis Date  . Abdominal aortic atherosclerosis ([place]) by CT 02/2014  . Acute kidney injury ([place]) 02/29/2016   During hospitalization 07/2017  . CAD (coronary artery disease)    by CT, per pt h/o MI  . Diabetes type 2, uncontrolled ([place])   . Frequent headaches   . GERD (gastroesophageal reflux disease)   . Gout   . History of pulmonary embolism 2012  . HLD (hyperlipidemia)   . HTN (hypertension)   . 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  Morbid obesity ([place])   . Myocardial infarction [facility]) 2012   per pt. report, states she was treated with medicine, here at [facility]    . Osteoarthritis    knees  . Primary localized osteoarthritis of left knee 09/22/2014  . Risk for falls 09/28/2017  . Sleep apnea 2011   study done in [place], states that since she lost weight she doesn't use the CPAP any longer & she doesn't have a problem with sleep apnea  . Stroke [facility])    still has balance problem on occas. , uses cane but that's mainly for the left knee pain  . Syncope 03/01/2016  . Thoracic aortic atherosclerosis ([place]) 12/2015   by CXR  . Vertigo    hx. benign postitional postural    Patient Active Problem List   Diagnosis Date Noted  . Abnormal TSH 09/25/2019  . Hip osteoarthritis 03/11/2019  . Pain due to onychomycosis of toenails of both feet 02/10/2019  . Type 2 diabetes mellitus with vascular disease ([place]) 02/10/2019  . Renal insufficiency 03/18/2018  . Polypharmacy 02/04/2018  . Urinary urgency 10/29/2017  . Seizure ([place]) 09/11/2017  . Aphasia   . Borderline hypothyroidism 03/12/2017  . Disturbance of skin sensation 09/21/2016  . Hemiparesis of right dominant side ([place]) 09/02/2016  . Intertrigo 08/16/2016  . Vitamin D deficiency 08/08/2016  . Gout 03/24/2016  . Syncope 02/29/2016  . Concussion 02/29/2016  .  Arthritis of left wrist 02/07/2016  . Hemoptysis 12/31/2015  . Thoracic aortic atherosclerosis ([place]) 12/24/2015  . History of CVA (cerebrovascular accident) 11/03/2015  . Abdominal aortic atherosclerosis ([place])   . Anemia 06/18/2015  . Sensorineural hearing loss (SNHL) of both ears 03/29/2015  . Pain of right middle finger 12/22/2014  . Memory deficit 12/04/2014  . Primary osteoarthritis of left knee 01/27/2014  . Advanced care planning/counseling discussion 12/11/2013  . Medicare annual wellness visit, subsequent 12/11/2013  . Health maintenance examination 12/11/2013  . CAD (coronary artery  disease)/coronary calcifications   . Chest pain 07/17/2013  . Benign paroxysmal positional vertigo 05/26/2013  . Hyperlipidemia associated with type 2 diabetes mellitus ([place])   . Severe obesity (BMI 35.0-39.9) with comorbidity ([place])   . History of pulmonary embolism   . Osteoarthritis   . Diabetes mellitus type 2, uncontrolled, with complications ([place])   . History of DVT (deep vein thrombosis) 12/21/2010  . Essential hypertension 05/13/2009    Past Surgical History:  Procedure Laterality Date  . CATARACT EXTRACTION Bilateral 2013  . EYE SURGERY     /w IOL  . FOOT SURGERY Right   . PARTIAL HYSTERECTOMY     for fibroids, ovaries remain  . TONSILLECTOMY    . TOTAL KNEE ARTHROPLASTY Right 1990s  . TOTAL KNEE ARTHROPLASTY Left 09/22/2014   [name], MD  . TUBAL LIGATION       OB History   No obstetric history on file.     Family History  Problem Relation Age of Onset  . Cancer Mother        bone  . Diabetes Father   . Hypertension Father   . Cancer Son 31       lung  . Congenital heart disease Son 10  . Stroke Brother     Social History   Tobacco Use  . Smoking status: Never Smoker  . Smokeless tobacco: Never Used  . Tobacco comment: tobacco use- no   Vaping Use  . Vaping Use: Never used  Substance Use Topics  . Alcohol use: Yes    Alcohol/week: 2.0 standard drinks    Types: 1 Glasses of wine, 1 Cans of beer per week    Comment: Rare  . Drug use: No    Home Medications Prior to Admission medications   Medication Sig Start Date End Date Taking? Authorizing Provider  amLODipine (NORVASC) 5 MG tablet Take 1 tablet (5 mg total) by mouth daily. 07/02/18   [name], MD  atorvastatin (LIPITOR) 40 MG tablet TAKE 1 TABLET BY MOUTH EVERY DAY 07/22/19   [name], MD  blood glucose meter kit and supplies KIT Dispense based on patient and insurance preference. Use up to four times daily as directed. (FOR ICD-9 250.00, 250.01). 08/31/17   Elby Beck, FNP  Cholecalciferol (VITAMIN D) 2000 units CAPS Take 1 capsule by mouth daily.    [provider]  clopidogrel (PLAVIX) 75 MG tablet Take 1 tablet (75 mg total) by mouth daily. 03/05/20   [name], MD  clotrimazole (LOTRIMIN AF) 1 % cream Apply 1 application topically 2 (two) times daily. Rash under breast Patient taking differently: Apply 1 application topically as needed. Rash under breast 08/16/16   [name], MD  Colchicine (MITIGARE) 0.6 MG CAPS Take 1 tablet by mouth daily as needed (for gout). 10/29/17   [name], MD  hydrALAZINE (APRESOLINE) 50 MG tablet TAKE 1 TABLET BY MOUTH TWICE A DAY 05/14/18   [name],  MD  ibuprofen (ADVIL,MOTRIN) 400 MG tablet Take 1 tablet (400 mg total) by mouth every 6 (six) hours as needed. 02/20/18   Agyei, Caprice Kluver, MD  Insulin Pen Needle (B-D UF III MINI PEN NEEDLES) 31G X 5 MM MISC INJECT 1 EACH AS DIRECTED AT BEDTIME. USE AS DIRECTED TO INJECT INSULIN. DX CODE: E11.8, E11.65 03/05/20   [name], MD  LANTUS SOLOSTAR 100 UNIT/ML Solostar Pen INJECT 18 UNITS INTO THE SKIN AT BEDTIME. FOR 3 MONTHS 02/07/20   [name], MD  losartan-hydrochlorothiazide [facility]) 100-12.5 MG tablet Take 1 tablet by mouth daily. 03/05/20   [name], MD  metoprolol succinate (TOPROL-XL) 25 MG 24 hr tablet TAKE 1 TABLET BY MOUTH EVERY DAY 01/29/20   [name], MD  nitroGLYCERIN (NITROSTAT) 0.4 MG SL tablet Place 0.4 mg under the tongue every 5 (five) minutes as needed for chest pain.    [provider]  [facility] DELICA LANCETS FINE MISC USE U PTO 4 TIMES DAILY AS DIRECTED 10/01/17   [name], MD  [facility] ULTRA test strip USE UP TO 4 TIMES DAILY AS DIRECTED 07/02/19   [name], MD  potassium chloride (KLOR-CON) 10 MEQ tablet Take 1 tablet (10 mEq total) by mouth every Monday, Wednesday, and Friday. 03/05/20   [name], MD  sitaGLIPtin (JANUVIA) 50 MG tablet Take 1 tablet  (50 mg total) by mouth daily. 04/15/19   [name], MD    Allergies    Peanut-containing drug products, Acetaminophen, Aleve [naproxen sodium], Doxycycline, Metformin and related, and Penicillins  Review of Systems   Review of Systems  Neurological: Positive for numbness and headaches.  All other systems reviewed and are negative.   Physical Exam Updated Vital Signs BP (!) 270/84 (BP Location: Left Arm)   Pulse 63   Temp 98.9 F (37.2 C) (Oral)   Resp 18   Ht '5\' 3"'  (1.6 m)   Wt 90.7 kg   SpO2 98%   BMI 35.43 kg/m   Physical Exam Vitals and nursing note reviewed.  Constitutional:      Appearance: She is well-developed. She is obese.  HENT:     Head: Normocephalic and atraumatic.     Comments: Tenderness over both temples Eyes:     Conjunctiva/sclera: Conjunctivae normal.     Pupils: Pupils are equal, round, and reactive to light.  Cardiovascular:     Rate and Rhythm: Normal rate and regular rhythm.     Heart sounds: Normal heart sounds.  Pulmonary:     Effort: Pulmonary effort is normal.     Breath sounds: Normal breath sounds.  Abdominal:     General: Bowel sounds are normal.     Palpations: Abdomen is soft.  Musculoskeletal:        General: Normal range of motion.     Cervical back: Normal range of motion.  Skin:    General: Skin is warm and dry.  Neurological:     Mental Status: She is alert and oriented to person, place, and time.     Comments: AAOx3, answering questions and following commands appropriately, speech clear and goal oriented; equal strength UE and LE bilaterally; CN grossly intact; moves all extremities appropriately without ataxia; no focal neuro deficits or facial asymmetry appreciated     ED Results / Procedures / Treatments   Labs (all labs ordered are listed, but only abnormal results are displayed) Labs Reviewed  COMPREHENSIVE METABOLIC PANEL - Abnormal; Notable for the following components:  Result Value   Sodium 134 (*)     Glucose, Bld 367 (*)    Creatinine, Ser 1.19 (*)    Calcium 8.8 (*)    Albumin 3.3 (*)    GFR, Estimated 47 (*)    All other components within normal limits  SEDIMENTATION RATE - Abnormal; Notable for the following components:   Sed Rate 23 (*)    All other components within normal limits  CBG MONITORING, ED - Abnormal; Notable for the following components:   Glucose-Capillary 329 (*)    All other components within normal limits  CBG MONITORING, ED - Abnormal; Notable for the following components:   Glucose-Capillary 283 (*)    All other components within normal limits  CBG MONITORING, ED - Abnormal; Notable for the following components:   Glucose-Capillary 253 (*)    All other components within normal limits  TROPONIN I (HIGH SENSITIVITY) - Abnormal; Notable for the following components:   Troponin I (High Sensitivity) 24 (*)    All other components within normal limits  TROPONIN I (HIGH SENSITIVITY) - Abnormal; Notable for the following components:   Troponin I (High Sensitivity) 20 (*)    All other components within normal limits  RESPIRATORY PANEL BY RT PCR (FLU A&B, COVID)  PROTIME-INR  APTT  CBC  DIFFERENTIAL  C-REACTIVE PROTEIN  I-STAT CHEM 8, ED    EKG EKG Interpretation  Date/Time:  Monday March 15 2020 16:15:04 EST Ventricular Rate:  54 PR Interval:  176 QRS Duration: 88 QT Interval:  456 QTC Calculation: 432 R Axis:   -12 Text Interpretation: Sinus bradycardia Moderate voltage criteria for LVH, may be normal variant ( R in aVL , Cornell product ) Marked ST abnormality, possible inferior subendocardial injury Abnormal ECG Confirmed by Thayer Jew [phone]) on 03/16/2020 2:40:53 AM   Radiology CT HEAD WO CONTRAST  Result Date: 03/15/2020 CLINICAL DATA:  Headache since Friday, numbness off and on, question mini-stroke; history of atherosclerotic disease, coronary artery disease post MI, type II diabetes mellitus, hypertension, prior stroke EXAM: CT  HEAD WITHOUT CONTRAST TECHNIQUE: Contiguous axial images were obtained from the base of the skull through the vertex without intravenous contrast. Sagittal and coronal MPR images reconstructed from axial data set. COMPARISON:  08/12/2017 FINDINGS: Brain: Generalized atrophy. Normal ventricular morphology. No midline shift or mass effect. Small vessel chronic ischemic changes of deep cerebral white matter. No intracranial hemorrhage, mass lesion, evidence of acute infarction, or extra-axial fluid collection. Vascular: Atherosclerotic calcification of internal carotid arteries at skull base. No hyperdense vessels. Skull: Intact.  Incomplete posterior arch C1. Sinuses/Orbits: Clear Other: N/A IMPRESSION: Atrophy with small vessel chronic ischemic changes of deep cerebral white matter. No acute intracranial abnormalities. Electronically Signed   By: Lavonia Dana M.D.   On: 03/15/2020 16:56   MR BRAIN WO CONTRAST  Result Date: 03/16/2020 CLINICAL DATA:  Headache EXAM: MRI HEAD WITHOUT CONTRAST TECHNIQUE: Multiplanar, multiecho pulse sequences of the brain and surrounding structures were obtained without intravenous contrast. COMPARISON:  Brain MRI 08/13/2017 FINDINGS: Brain: No acute infarct, acute hemorrhage or extra-axial collection. Multifocal hyperintense T2-weighted signal within the white matter. Normal volume of CSF spaces. Multiple chronic microhemorrhages in a predominantly central distribution. Normal midline structures. There are multiple old cerebellar small vessel infarcts. Vascular: Major flow voids are preserved. Skull and upper cervical spine: Normal calvarium and skull base. Visualized upper cervical spine and soft tissues are normal. Sinuses/Orbits:No paranasal sinus fluid levels or advanced mucosal thickening. No mastoid or middle ear effusion. Normal  orbits. IMPRESSION: 1. No acute intracranial abnormality. 2. Multiple old cerebellar small vessel infarcts and findings of chronic ischemic  microangiopathy. 3. Multiple predominantly central chronic microhemorrhages most consistent with chronic hypertensive angiopathy. Electronically Signed   By: Ulyses Jarred M.D.   On: 03/16/2020 01:41    Procedures Procedures (including critical care time)  CRITICAL CARE Performed by: Larene Pickett   Total critical care time: 45 minutes  Critical care time was exclusive of separately billable procedures and treating other patients.  Critical care was necessary to treat or prevent imminent or life-threatening deterioration.  Critical care was time spent personally by me on the following activities: development of treatment plan with patient and/or surrogate as well as nursing, discussions with consultants, evaluation of patient's response to treatment, examination of patient, obtaining history from patient or surrogate, ordering and performing treatments and interventions, ordering and review of laboratory studies, ordering and review of radiographic studies, pulse oximetry and re-evaluation of patient's condition.   Medications Ordered in ED Medications  nicardipine (CARDENE) 20m in 0.86% saline 2068mIV infusion (0.1 mg/ml) (has no administration in time range)  sodium chloride flush (NS) 0.9 % injection 3 mL (3 mLs Intravenous Given 03/16/20 0009)  hydrALAZINE (APRESOLINE) injection 10 mg (10 mg Intravenous Given 03/16/20 0046)    ED Course  I have reviewed the triage vital signs and the nursing notes.  Pertinent labs & imaging results that were available during my care of the patient were reviewed by me and considered in my medical decision making (see chart for details).    MDM Rules/Calculators/A&P  [age].o. F here with headaches and right leg issues when walking since Thursday.  States initially intermittent but more constant today.  She is AAOx3, no focal neurologic deficits here.  Localized headache to temples, locally tender bilaterally.  Initial screening labs grossly  reassuring-- trop is 24 but denies chest pain/SOB.  CT head negative.  Likely will need work-up for temporal arteritis given risk factors along with MRI.  Will discuss with neurology.  Of note, patient's BP very elevated, 270/102 currently.  Has hx of HTN, states usually 15497'W-263'Zystolic and this is consistent with prior notes from PCP.  12:11 AM Discussed with neurology, Dr. KiLeonel Ramsay will obtain CRP, ESR, MRI brain.  Will judiciously lower BP to allow some permissive HTN until MRI completed, recommends 22858-850ystolic range.  If all reassuring, can follow-up as an OP.  2:55 AM BP up-trending again, now 253/102.  Patient is already on 4 BP medications at home, has been compliant without missed doses. Only transient improvement with IV hydralazine.  She does have new EKG changes today with TWI inferior and laterally but denies chest pain or SOB.  May ultimately all be related to her BP.  Will admit for hypertensive emergency.  Initially discussed with hospitalist, requested critical care consultation.  Discussed with ICU, Dr. SoOletta Darter requested to start cardene drip and they will admit for ongoing care.  Final Clinical Impression(s) / ED Diagnoses Final diagnoses:  Hypertensive urgency  Bad headache  EKG abnormalities    Rx / DC Orders ED Discharge Orders    None       SaLarene PickettPA-C 03/16/20 [id number]  HoMerryl HackerMD 03/17/20 [id number] [phone]

## 2020-03-15 NOTE — ED Triage Notes (Signed)
C/O headache since Friday, and numbness on and off in nature. Patient stated she is concern for another "mini stroke" endorsed some visual sensation that was not addressed when seen by her eye doctor.

## 2020-03-16 ENCOUNTER — Inpatient Hospital Stay (HOSPITAL_COMMUNITY): Payer: Medicare Other

## 2020-03-16 ENCOUNTER — Emergency Department (HOSPITAL_COMMUNITY): Payer: Medicare Other

## 2020-03-16 DIAGNOSIS — Z888 Allergy status to other drugs, medicaments and biological substances status: Secondary | ICD-10-CM | POA: Diagnosis not present

## 2020-03-16 DIAGNOSIS — I2510 Atherosclerotic heart disease of native coronary artery without angina pectoris: Secondary | ICD-10-CM | POA: Diagnosis present

## 2020-03-16 DIAGNOSIS — R519 Headache, unspecified: Secondary | ICD-10-CM | POA: Diagnosis not present

## 2020-03-16 DIAGNOSIS — I160 Hypertensive urgency: Secondary | ICD-10-CM | POA: Diagnosis not present

## 2020-03-16 DIAGNOSIS — I700 Atherosclerosis of aorta: Secondary | ICD-10-CM | POA: Diagnosis present

## 2020-03-16 DIAGNOSIS — Z8673 Personal history of transient ischemic attack (TIA), and cerebral infarction without residual deficits: Secondary | ICD-10-CM | POA: Diagnosis not present

## 2020-03-16 DIAGNOSIS — Z880 Allergy status to penicillin: Secondary | ICD-10-CM | POA: Diagnosis not present

## 2020-03-16 DIAGNOSIS — R079 Chest pain, unspecified: Secondary | ICD-10-CM

## 2020-03-16 DIAGNOSIS — I252 Old myocardial infarction: Secondary | ICD-10-CM | POA: Diagnosis not present

## 2020-03-16 DIAGNOSIS — E1165 Type 2 diabetes mellitus with hyperglycemia: Secondary | ICD-10-CM | POA: Diagnosis present

## 2020-03-16 DIAGNOSIS — E1122 Type 2 diabetes mellitus with diabetic chronic kidney disease: Secondary | ICD-10-CM | POA: Diagnosis present

## 2020-03-16 DIAGNOSIS — I129 Hypertensive chronic kidney disease with stage 1 through stage 4 chronic kidney disease, or unspecified chronic kidney disease: Secondary | ICD-10-CM | POA: Diagnosis present

## 2020-03-16 DIAGNOSIS — N1831 Chronic kidney disease, stage 3a: Secondary | ICD-10-CM | POA: Diagnosis present

## 2020-03-16 DIAGNOSIS — R4182 Altered mental status, unspecified: Secondary | ICD-10-CM | POA: Diagnosis present

## 2020-03-16 DIAGNOSIS — I161 Hypertensive emergency: Secondary | ICD-10-CM | POA: Diagnosis present

## 2020-03-16 DIAGNOSIS — Z823 Family history of stroke: Secondary | ICD-10-CM | POA: Diagnosis not present

## 2020-03-16 DIAGNOSIS — E669 Obesity, unspecified: Secondary | ICD-10-CM | POA: Diagnosis present

## 2020-03-16 DIAGNOSIS — E785 Hyperlipidemia, unspecified: Secondary | ICD-10-CM | POA: Diagnosis present

## 2020-03-16 DIAGNOSIS — Z881 Allergy status to other antibiotic agents status: Secondary | ICD-10-CM | POA: Diagnosis not present

## 2020-03-16 DIAGNOSIS — Z20822 Contact with and (suspected) exposure to covid-19: Secondary | ICD-10-CM | POA: Diagnosis present

## 2020-03-16 DIAGNOSIS — Z809 Family history of malignant neoplasm, unspecified: Secondary | ICD-10-CM | POA: Diagnosis not present

## 2020-03-16 DIAGNOSIS — Z6835 Body mass index (BMI) 35.0-35.9, adult: Secondary | ICD-10-CM | POA: Diagnosis not present

## 2020-03-16 DIAGNOSIS — Z79899 Other long term (current) drug therapy: Secondary | ICD-10-CM | POA: Diagnosis not present

## 2020-03-16 DIAGNOSIS — Z886 Allergy status to analgesic agent status: Secondary | ICD-10-CM | POA: Diagnosis not present

## 2020-03-16 DIAGNOSIS — K219 Gastro-esophageal reflux disease without esophagitis: Secondary | ICD-10-CM | POA: Diagnosis present

## 2020-03-16 DIAGNOSIS — Z86711 Personal history of pulmonary embolism: Secondary | ICD-10-CM | POA: Diagnosis not present

## 2020-03-16 DIAGNOSIS — Z8249 Family history of ischemic heart disease and other diseases of the circulatory system: Secondary | ICD-10-CM | POA: Diagnosis not present

## 2020-03-16 LAB — TROPONIN I (HIGH SENSITIVITY): Troponin I (High Sensitivity): 20 ng/L — ABNORMAL HIGH (ref <18)

## 2020-03-16 LAB — CREATININE, SERUM
Creatinine, Ser: 1.07 mg/dL — ABNORMAL HIGH (ref 0.44–1.00)
GFR, Estimated: 53 mL/min — ABNORMAL LOW (ref >60)

## 2020-03-16 LAB — RESPIRATORY PANEL BY RT PCR (FLU A&B, COVID)
Influenza A by PCR: NEGATIVE
Influenza B by PCR: NEGATIVE
SARS Coronavirus 2 by RT PCR: NEGATIVE

## 2020-03-16 LAB — GLUCOSE, CAPILLARY
Glucose-Capillary: 441 mg/dL — ABNORMAL HIGH (ref 70–99)
Glucose-Capillary: 327 mg/dL — ABNORMAL HIGH (ref 70–99)
Glucose-Capillary: 263 mg/dL — ABNORMAL HIGH (ref 70–99)
Glucose-Capillary: 175 mg/dL — ABNORMAL HIGH (ref 70–99)

## 2020-03-16 LAB — CBC
HCT: 42.3 % (ref 36.0–46.0)
Hemoglobin: 13.1 g/dL (ref 12.0–15.0)
MCH: 26.8 pg (ref 26.0–34.0)
MCHC: 31 g/dL (ref 30.0–36.0)
MCV: 86.7 fL (ref 80.0–100.0)
Platelets: 231 10*3/uL (ref 150–400)
RBC: 4.88 MIL/uL (ref 3.87–5.11)
RDW: 14.8 % (ref 11.5–15.5)
WBC: 10 10*3/uL (ref 4.0–10.5)
nRBC: 0 % (ref 0.0–0.2)

## 2020-03-16 LAB — SEDIMENTATION RATE: Sed Rate: 23 mm/hr — ABNORMAL HIGH (ref 0–22)

## 2020-03-16 LAB — ECHOCARDIOGRAM COMPLETE
Area-P 1/2: 2.62 cm2
Calc EF: 59.3 %
Height: 63 in
S' Lateral: 2.3 cm
Single Plane A2C EF: 62 %
Single Plane A4C EF: 56.3 %
Weight: 3058.22 oz

## 2020-03-16 LAB — MRSA PCR SCREENING: MRSA by PCR: NEGATIVE

## 2020-03-16 LAB — HEMOGLOBIN A1C
Hgb A1c MFr Bld: 10.7 % — ABNORMAL HIGH (ref 4.8–5.6)
Mean Plasma Glucose: 260.39 mg/dL

## 2020-03-16 LAB — C-REACTIVE PROTEIN: CRP: 0.6 mg/dL (ref <1.0)

## 2020-03-16 MED ORDER — CLOPIDOGREL BISULFATE 75 MG PO TABS
75.0000 mg | ORAL_TABLET | Freq: Every day | ORAL | Status: DC
  Administered 2020-03-16 – 2020-03-19 (×4): 75 mg via ORAL
  Filled 2020-03-16 (×4): qty 1

## 2020-03-16 MED ORDER — HYDRALAZINE HCL 20 MG/ML IJ SOLN
10.0000 mg | INTRAMUSCULAR | Status: AC
  Administered 2020-03-16: 10 mg via INTRAVENOUS
  Filled 2020-03-16: qty 1

## 2020-03-16 MED ORDER — INSULIN ASPART 100 UNIT/ML ~~LOC~~ SOLN
0.0000 [IU] | Freq: Three times a day (TID) | SUBCUTANEOUS | Status: DC
  Administered 2020-03-16 – 2020-03-17 (×2): 4 [IU] via SUBCUTANEOUS
  Administered 2020-03-17: 3 [IU] via SUBCUTANEOUS
  Administered 2020-03-17: 11 [IU] via SUBCUTANEOUS
  Administered 2020-03-17: 7 [IU] via SUBCUTANEOUS
  Administered 2020-03-18: 11 [IU] via SUBCUTANEOUS
  Administered 2020-03-18: 3 [IU] via SUBCUTANEOUS
  Administered 2020-03-18: 4 [IU] via SUBCUTANEOUS
  Administered 2020-03-18: 7 [IU] via SUBCUTANEOUS
  Administered 2020-03-19: 3 [IU] via SUBCUTANEOUS

## 2020-03-16 MED ORDER — INSULIN ASPART 100 UNIT/ML ~~LOC~~ SOLN: 0.0000 [IU] | Freq: Three times a day (TID) | SUBCUTANEOUS | Status: DC

## 2020-03-16 MED ORDER — INSULIN GLARGINE 100 UNIT/ML ~~LOC~~ SOLN
18.0000 [IU] | Freq: Every day | SUBCUTANEOUS | Status: DC
  Administered 2020-03-16 – 2020-03-18 (×3): 18 [IU] via SUBCUTANEOUS
  Filled 2020-03-16 (×5): qty 0.18

## 2020-03-16 MED ORDER — CHLORHEXIDINE GLUCONATE CLOTH 2 % EX PADS
6.0000 | MEDICATED_PAD | Freq: Every day | CUTANEOUS | Status: DC
  Administered 2020-03-16: 6 via TOPICAL

## 2020-03-16 MED ORDER — METOPROLOL SUCCINATE ER 25 MG PO TB24
25.0000 mg | ORAL_TABLET | Freq: Every day | ORAL | Status: DC
  Administered 2020-03-16 – 2020-03-18 (×3): 25 mg via ORAL
  Filled 2020-03-16 (×4): qty 1

## 2020-03-16 MED ORDER — HYDRALAZINE HCL 50 MG PO TABS
50.0000 mg | ORAL_TABLET | Freq: Two times a day (BID) | ORAL | Status: DC
  Administered 2020-03-16 – 2020-03-19 (×7): 50 mg via ORAL
  Filled 2020-03-16 (×2): qty 1
  Filled 2020-03-16 (×2): qty 2
  Filled 2020-03-16: qty 1
  Filled 2020-03-16: qty 2
  Filled 2020-03-16: qty 1

## 2020-03-16 MED ORDER — DOCUSATE SODIUM 100 MG PO CAPS: 100.0000 mg | ORAL_CAPSULE | Freq: Two times a day (BID) | ORAL | Status: DC | PRN

## 2020-03-16 MED ORDER — LOSARTAN POTASSIUM 50 MG PO TABS
100.0000 mg | ORAL_TABLET | Freq: Every day | ORAL | Status: DC
  Administered 2020-03-16 – 2020-03-19 (×4): 100 mg via ORAL
  Filled 2020-03-16 (×4): qty 2

## 2020-03-16 MED ORDER — HYDRALAZINE HCL 20 MG/ML IJ SOLN: 10.0000 mg | INTRAMUSCULAR | Status: DC

## 2020-03-16 MED ORDER — HEPARIN SODIUM (PORCINE) 5000 UNIT/ML IJ SOLN
5000.0000 [IU] | Freq: Three times a day (TID) | INTRAMUSCULAR | Status: DC
  Administered 2020-03-16 – 2020-03-19 (×9): 5000 [IU] via SUBCUTANEOUS
  Filled 2020-03-16 (×9): qty 1

## 2020-03-16 MED ORDER — AMLODIPINE BESYLATE 5 MG PO TABS
5.0000 mg | ORAL_TABLET | Freq: Every day | ORAL | Status: DC
  Administered 2020-03-16 – 2020-03-19 (×4): 5 mg via ORAL
  Filled 2020-03-16 (×4): qty 1

## 2020-03-16 MED ORDER — LOSARTAN POTASSIUM-HCTZ 100-12.5 MG PO TABS: 1.0000 | ORAL_TABLET | Freq: Every day | ORAL | Status: DC

## 2020-03-16 MED ORDER — HYDROCHLOROTHIAZIDE 12.5 MG PO CAPS
12.5000 mg | ORAL_CAPSULE | Freq: Every day | ORAL | Status: DC
  Administered 2020-03-16 – 2020-03-19 (×4): 12.5 mg via ORAL
  Filled 2020-03-16 (×4): qty 1

## 2020-03-16 MED ORDER — ATORVASTATIN CALCIUM 40 MG PO TABS
40.0000 mg | ORAL_TABLET | Freq: Every day | ORAL | Status: DC
  Administered 2020-03-16 – 2020-03-19 (×4): 40 mg via ORAL
  Filled 2020-03-16 (×4): qty 1

## 2020-03-16 MED ORDER — POLYETHYLENE GLYCOL 3350 17 G PO PACK: 17.0000 g | PACK | Freq: Every day | ORAL | Status: DC | PRN

## 2020-03-16 MED ORDER — NICARDIPINE HCL IN NACL 20-0.86 MG/200ML-% IV SOLN
3.0000 mg/h | INTRAVENOUS | Status: DC
  Administered 2020-03-16: 5 mg/h via INTRAVENOUS
  Filled 2020-03-16 (×5): qty 200

## 2020-03-16 MED ORDER — INSULIN ASPART 100 UNIT/ML ~~LOC~~ SOLN
0.0000 [IU] | Freq: Three times a day (TID) | SUBCUTANEOUS | Status: DC
  Administered 2020-03-16: 11 [IU] via SUBCUTANEOUS
  Administered 2020-03-16: 15 [IU] via SUBCUTANEOUS
  Administered 2020-03-16: 20 [IU] via SUBCUTANEOUS

## 2020-03-16 NOTE — Progress Notes (Signed)
[place] Progress Note Patient Name: GIORGIA WAHLER DOB: 1940-09-28 MRN: [id number]   Date of Service  03/16/2020  HPI/Events of Note  Hyperglycemia - on lantus and SSI with meals  eICU Interventions  Changing SSI to with meals and at bedtime Check another glucose at 2 am      Intervention Category Intermediate Interventions: Hyperglycemia - evaluation and treatment  Margaretmary Lombard 03/16/2020, 10:01 PM

## 2020-03-16 NOTE — Progress Notes (Signed)
  Echocardiogram 2D Echocardiogram has been performed.  Geoffery Lyons Swaim 03/16/2020, 9:11 AM

## 2020-03-16 NOTE — H&P (Signed)
NAME:  Kristen [name], MRN:  [id number], DOB:  [birth year], LOS: 0 ADMISSION DATE:  03/15/2020, CONSULTATION DATE:  03/16/20 REFERRING MD:  EDP, CHIEF COMPLAINT:  headache   Brief History   79 year old female with past medical history of CAD, hypertension, PE, hyperlipidemia and stroke who presented with headache and dizziness and was found to have a blood pressure of 270/84.  She was started on a Cardene drip and PCCM consulted for admission  History of present illness   Kristen [name]. Kristen [name] is a 79 year old female with past medical history significant for  CAD, hypertension, PE, hyperlipidemia and stroke who presented with headache and dizziness with intermittent chest and facial burning sensation that began 1 day ago.  She does not take her BP at home, but  been taking all of her medications (metoprolol, hydralazine, losartan, HCTZ, Norvasc) and denies recent changes in any doses, leg, edema or chest pain/exertional dyspnea prior to today's episode.  In the ED, initial SBP was 270/84, head CT without acute findings and troponin 24->20, EKG without acute ischemic changes.  She was given Hydralazine 70m without improvement, so cardene gtt started and PCCM consulted for admission.   Past Medical History   has a past medical history of Abdominal aortic atherosclerosis ([facility] (by CT 02/2014), [place] kidney [place] ([facility] (02/29/2016), CAD (coronary artery disease), Diabetes type 2, uncontrolled ([place], Frequent headaches, GERD (gastroesophageal reflux disease), Gout, History of pulmonary embolism (2012), HLD (hyperlipidemia), HTN (hypertension), Internal capsule hemorrhage ([facility], Morbid obesity ([facility], Myocardial infarction ([facility] [place] (2012), Osteoarthritis, Primary localized osteoarthritis of left knee (09/22/2014), Risk for falls (09/28/2017), Sleep apnea (2011), Stroke ([facility], Syncope (03/01/2016), Thoracic aortic atherosclerosis ([place] (12/2015), and Vertigo.   Significant Hospital Events   11/23 admit to  PCCM  Consults:    Procedures:    Significant Diagnostic Tests:  11/23 CT head>>Atrophy with small vessel chronic ischemic changes of deep cerebral white matter  11/23 MRI brain>> No acute intracranial abnormality.  Multiple old cerebellar small vessel infarcts and findings of chronic ischemic microangiopathy.  Multiple predominantly central chronic microhemorrhages most consistent with chronic hypertensive angiopathy.   Micro Data:  11/26 Covid-19 and Flu>>negative  Antimicrobials:     Interim history/subjective:  Pt feeling improved since ED arrival  Objective   Blood pressure (!) 244/89, pulse (!) 58, temperature 98.9 F (37.2 C), temperature source Oral, resp. rate 12, height '5\' 3"'  (1.6 m), weight 90.7 kg, SpO2 100 %.       No intake or output data in the 24 hours ending 03/16/20 0507 Filed Weights   03/15/20 1618  Weight: 90.7 kg    General:  Awake F, alert and in no distress HEENT: MM pink/moist Neuro: pt is alert and oriented x3, no obvious facial droop, slurred speech and patient is following commands CV: s1s2 rrr, no m/r/g PULM:  CTAB GI: soft, bsx4 active  Extremities: warm/dry,  No edema  Skin: no rashes or lesions   Resolved Hospital Problem list     Assessment & Plan:   Hypertensive Urgency, CAD, HL MI in 2012 with last echo in 2019 showing EF 60-65% and grade 1 diastolic dysfunction; Negative stress test 2015 P: -start Cardene gtt, resume home medications, titrate off drip as able with initial goal of 10-20% BP reduction -no acute findings on head imaging, check CXR -trop peaked at 24 and down-trending -Repeat Echo -continue Plavix and Statin   Type 2 DM Last A1c, 8.2%  -Continue home Lantus and SSI -Hold Januvia while admitted  Renal Insufficiency Creatinine 1.15, at baseline P: -Continue to follow renal indices      Best practice:  Diet: diabetic Pain/Anxiety/Delirium protocol (if indicated): n/a VAP protocol (if  indicated): n/a DVT prophylaxis: heparin GI prophylaxis: n/a Glucose control: SSI, Lantus Mobility: with assist Code Status: full code Family Communication: son updated at the bedside Disposition: ICU  Labs   CBC: Recent Labs  Lab 03/15/20 1700  WBC 7.8  NEUTROABS 5.1  HGB 12.0  HCT 38.9  MCV 88.2  PLT 938    Basic Metabolic Panel: Recent Labs  Lab 03/15/20 1700  NA 134*  K 3.6  CL 98  CO2 28  GLUCOSE 367*  BUN 17  CREATININE 1.19*  CALCIUM 8.8*   GFR: Estimated Creatinine Clearance: 41.6 mL/min (A) (by C-G formula based on SCr of 1.19 mg/dL (H)). Recent Labs  Lab 03/15/20 1700  WBC 7.8    Liver Function Tests: Recent Labs  Lab 03/15/20 1700  AST 16  ALT 17  ALKPHOS 88  BILITOT 0.8  PROT 6.7  ALBUMIN 3.3*   No results for input(s): LIPASE, AMYLASE in the last 168 hours. No results for input(s): AMMONIA in the last 168 hours.  ABG    Component Value Date/Time   PHART 7.44 08/13/2017 1647   PCO2ART 42 08/13/2017 1647   PO2ART 82 (L) 08/13/2017 1647   HCO3 28.5 (H) 08/13/2017 1647   TCO2 27 09/02/2016 1450   O2SAT 96.4 08/13/2017 1647     Coagulation Profile: Recent Labs  Lab 03/15/20 1700  INR 1.1    Cardiac Enzymes: No results for input(s): CKTOTAL, CKMB, CKMBINDEX, TROPONINI in the last 168 hours.  HbA1C: Hemoglobin A1C  Date/Time Value Ref Range Status  09/25/2019 10:07 AM 7.6 (A) 4.0 - 5.6 % Final  10/17/2018 09:12 AM 8.6 (A) 4.0 - 5.6 % Final   Hgb A1c MFr Bld  Date/Time Value Ref Range Status  03/24/2019 09:57 AM 8.2 (H) 4.6 - 6.5 % Final    Comment:    Glycemic Control Guidelines for People with Diabetes:Non Diabetic:  <6%Goal of Therapy: <7%Additional Action Suggested:  >8%   03/12/2018 09:34 AM 7.9 (H) 4.6 - 6.5 % Final    Comment:    Glycemic Control Guidelines for People with Diabetes:Non Diabetic:  <6%Goal of Therapy: <7%Additional Action Suggested:  >8%     CBG: Recent Labs  Lab 03/15/20 1736 03/15/20 1947  03/15/20 2113  GLUCAP 329* 59* 253*    Review of Systems:   Negative except as noted in HPI  Past Medical History  She,  has a past medical history of Abdominal aortic atherosclerosis ([place]) (by CT 02/2014), Acute kidney injury ([place]) (02/29/2016), CAD (coronary artery disease), Diabetes type 2, uncontrolled ([place]), Frequent headaches, GERD (gastroesophageal reflux disease), Gout, History of pulmonary embolism (2012), HLD (hyperlipidemia), HTN (hypertension), Internal capsule hemorrhage ([place]), Morbid obesity ([place]), Myocardial infarction ([place]) (2012), Osteoarthritis, Primary localized osteoarthritis of left knee (09/22/2014), Risk for falls (09/28/2017), Sleep apnea (2011), Stroke [facility]), Syncope (03/01/2016), Thoracic aortic atherosclerosis ([place]) (12/2015), and Vertigo.   Surgical History    Past Surgical History:  Procedure Laterality Date  . CATARACT EXTRACTION Bilateral 2013  . EYE SURGERY     /w IOL  . FOOT SURGERY Right   . PARTIAL HYSTERECTOMY     for fibroids, ovaries remain  . TONSILLECTOMY    . TOTAL KNEE ARTHROPLASTY Right 1990s  . TOTAL KNEE ARTHROPLASTY Left 09/22/2014   [name], MD  . TUBAL LIGATION  Social History   reports that she has never smoked. She has never used smokeless tobacco. She reports current alcohol use of about 2.0 standard drinks of alcohol per week. She reports that she does not use drugs.   Family History   Her family history includes Cancer in her mother; Cancer (age of onset: 55) in her son; Congenital heart disease (age of onset: 56) in her son; Diabetes in her father; Hypertension in her father; Stroke in her brother.   Allergies Allergies  Allergen Reactions  . Peanut-Containing Drug Products Itching  . Acetaminophen Other (See Comments)    "Causes me to spit up blood"  . Aleve [Naproxen Sodium] Other (See Comments)    Spits up blood  . Doxycycline Other (See Comments)    Malaise, GI upset, "felt drunk" and very ill  . Metformin  And Related Other (See Comments)    Chills, dizziness  . Penicillins Rash    Has patient had a PCN reaction causing immediate rash, facial/tongue/throat swelling, SOB or lightheadedness with hypotension: Yes Has patient had a PCN reaction causing severe rash involving mucus membranes or skin necrosis: No Has patient had a PCN reaction that required hospitalization No Has patient had a PCN reaction occurring within the last 10 years: No If all of the above answers are "NO", then may proceed with Cephalosporin use.      Home Medications  Prior to Admission medications   Medication Sig Start Date End Date Taking? Authorizing Provider  amLODipine (NORVASC) 5 MG tablet Take 1 tablet (5 mg total) by mouth daily. 07/02/18   [name], MD  atorvastatin (LIPITOR) 40 MG tablet TAKE 1 TABLET BY MOUTH EVERY DAY 07/22/19   [name], MD  blood glucose meter kit and supplies KIT Dispense based on patient and insurance preference. Use up to four times daily as directed. (FOR ICD-9 250.00, 250.01). 08/31/17   Elby Beck, FNP  Cholecalciferol (VITAMIN D) 2000 units CAPS Take 1 capsule by mouth daily.    [provider]  clopidogrel (PLAVIX) 75 MG tablet Take 1 tablet (75 mg total) by mouth daily. 03/05/20   [name], MD  clotrimazole (LOTRIMIN AF) 1 % cream Apply 1 application topically 2 (two) times daily. Rash under breast Patient taking differently: Apply 1 application topically as needed. Rash under breast 08/16/16   [name], MD  Colchicine (MITIGARE) 0.6 MG CAPS Take 1 tablet by mouth daily as needed (for gout). 10/29/17   [name], MD  hydrALAZINE (APRESOLINE) 50 MG tablet TAKE 1 TABLET BY MOUTH TWICE A DAY 05/14/18   [name], MD  ibuprofen (ADVIL,MOTRIN) 400 MG tablet Take 1 tablet (400 mg total) by mouth every 6 (six) hours as needed. 02/20/18   Agyei, Caprice Kluver, MD  Insulin Pen Needle (B-D UF III MINI PEN NEEDLES) 31G X 5 MM MISC INJECT 1  EACH AS DIRECTED AT BEDTIME. USE AS DIRECTED TO INJECT INSULIN. DX CODE: E11.8, E11.65 03/05/20   [name], MD  LANTUS SOLOSTAR 100 UNIT/ML Solostar Pen INJECT 18 UNITS INTO THE SKIN AT BEDTIME. FOR 3 MONTHS 02/07/20   [name], MD  losartan-hydrochlorothiazide [facility]) 100-12.5 MG tablet Take 1 tablet by mouth daily. 03/05/20   [name], MD  metoprolol succinate (TOPROL-XL) 25 MG 24 hr tablet TAKE 1 TABLET BY MOUTH EVERY DAY 01/29/20   [name], MD  nitroGLYCERIN (NITROSTAT) 0.4 MG SL tablet Place 0.4 mg under the tongue every 5 (five) minutes as needed for chest pain.  [provider]  [facility] DELICA LANCETS FINE MISC USE U PTO 4 TIMES DAILY AS DIRECTED 10/01/17   [name], MD  [facility] ULTRA test strip USE UP TO 4 TIMES DAILY AS DIRECTED 07/02/19   [name], MD  potassium chloride (KLOR-CON) 10 MEQ tablet Take 1 tablet (10 mEq total) by mouth every Monday, Wednesday, and Friday. 03/05/20   [name], MD  sitaGLIPtin (JANUVIA) 50 MG tablet Take 1 tablet (50 mg total) by mouth daily. 04/15/19   [name], MD     Critical care time: 40 minutes       CRITICAL CARE Performed by: Otilio Carpen Nikolina Simerson   Total critical care time: 40 minutes  Critical care time was exclusive of separately billable procedures and treating other patients.  Critical care was necessary to treat or prevent imminent or life-threatening deterioration.  Critical care was time spent personally by me on the following activities: development of treatment plan with patient and/or surrogate as well as nursing, discussions with consultants, evaluation of patient's response to treatment, examination of patient, obtaining history from patient or surrogate, ordering and performing treatments and interventions, ordering and review of laboratory studies, ordering and review of radiographic studies, pulse oximetry and re-evaluation of patient's  condition.   Otilio Carpen Vicci Reder, PA-C [place] [facility]  Pager# [phone], if no answer [phone]

## 2020-03-16 NOTE — Progress Notes (Signed)
Spoke with patient about her diabetes. States that she was diagnosed about 4 years ago. Has been on Lantus 18 units at bedtime and Januvia 50 mg daily. Reviewed her HgbA1C of 10.7% and discussed blood sugars in conjuction with her A1C. States that she checks her blood sugars maybe twice per week. Will follow up with PCP after discharge.   Harvel Ricks RN BSN CDE Diabetes Coordinator Pager: [phone]  8am-5pm

## 2020-03-17 DIAGNOSIS — I160 Hypertensive urgency: Secondary | ICD-10-CM

## 2020-03-17 LAB — CBC
HCT: 37.7 % (ref 36.0–46.0)
Hemoglobin: 12 g/dL (ref 12.0–15.0)
MCH: 27.2 pg (ref 26.0–34.0)
MCHC: 31.8 g/dL (ref 30.0–36.0)
MCV: 85.5 fL (ref 80.0–100.0)
Platelets: 231 10*3/uL (ref 150–400)
RBC: 4.41 MIL/uL (ref 3.87–5.11)
RDW: 14.9 % (ref 11.5–15.5)
WBC: 10.7 10*3/uL — ABNORMAL HIGH (ref 4.0–10.5)
nRBC: 0 % (ref 0.0–0.2)

## 2020-03-17 LAB — BASIC METABOLIC PANEL
Anion gap: 11 (ref 5–15)
BUN: 29 mg/dL — ABNORMAL HIGH (ref 8–23)
CO2: 27 mmol/L (ref 22–32)
Calcium: 9.4 mg/dL (ref 8.9–10.3)
Chloride: 100 mmol/L (ref 98–111)
Creatinine, Ser: 1.19 mg/dL — ABNORMAL HIGH (ref 0.44–1.00)
GFR, Estimated: 47 mL/min — ABNORMAL LOW (ref >60)
Glucose, Bld: 161 mg/dL — ABNORMAL HIGH (ref 70–99)
Potassium: 3.5 mmol/L (ref 3.5–5.1)
Sodium: 138 mmol/L (ref 135–145)

## 2020-03-17 LAB — GLUCOSE, CAPILLARY
Glucose-Capillary: 179 mg/dL — ABNORMAL HIGH (ref 70–99)
Glucose-Capillary: 150 mg/dL — ABNORMAL HIGH (ref 70–99)
Glucose-Capillary: 174 mg/dL — ABNORMAL HIGH (ref 70–99)
Glucose-Capillary: 227 mg/dL — ABNORMAL HIGH (ref 70–99)
Glucose-Capillary: 248 mg/dL — ABNORMAL HIGH (ref 70–99)
Glucose-Capillary: 278 mg/dL — ABNORMAL HIGH (ref 70–99)

## 2020-03-17 LAB — MAGNESIUM: Magnesium: 1.7 mg/dL (ref 1.7–2.4)

## 2020-03-17 LAB — PHOSPHORUS: Phosphorus: 4 mg/dL (ref 2.5–4.6)

## 2020-03-17 MED ORDER — POTASSIUM CHLORIDE CRYS ER 20 MEQ PO TBCR
40.0000 meq | EXTENDED_RELEASE_TABLET | Freq: Once | ORAL | Status: AC
  Administered 2020-03-17: 40 meq via ORAL
  Filled 2020-03-17: qty 2

## 2020-03-17 NOTE — Consult Note (Signed)
   [facility] CM Inpatient Consult   03/17/2020  Ellan Tess Goytia [birth year] [id number]  [facility] [ACO] Patient: [employer]   Patient is currently active with [facility] for chronic disease management services.  Patient has been engaged by a [place] community based plan of care has focused on disease management and community resource support.    Plan: Will continue to follow for post hospital needs.  Chart review reveals notes from Diabetes Coordinator the Hemoglobin A1C is 10.7.   Of note, [facility] Care Management services does not replace or interfere with any services that are needed or arranged by inpatient [facility] care management team.  For additional questions or referrals please contact:  Natividad Brood, RN BSN [facility] Liaison  [phone] business mobile phone Toll free office [phone]  Fax number: [phone] [email or web address]

## 2020-03-17 NOTE — Progress Notes (Signed)
NAME:  Kristen [name], MRN:  [id number], DOB:  [birth year], LOS: 1 ADMISSION DATE:  03/15/2020, CONSULTATION DATE:  03/17/20 REFERRING MD:  EDP, CHIEF COMPLAINT:  headache   Brief History   79 year old female with past medical history of CAD, hypertension, PE, hyperlipidemia and stroke who presented with headache and dizziness and was found to have a blood pressure of 270/84.  She was started on a Cardene drip and PCCM consulted for admission  History of present illness   Kristen [name]. Kristen [name] is a 79 year old female with past medical history significant for  CAD, hypertension, PE, hyperlipidemia and stroke who presented with headache and dizziness with intermittent chest and facial burning sensation that began 1 day ago.  She does not take her BP at home, but  been taking all of her medications (metoprolol, hydralazine, losartan, HCTZ, Norvasc) and denies recent changes in any doses, leg, edema or chest pain/exertional dyspnea prior to today's episode.  In the ED, initial SBP was 270/84, head CT without acute findings and troponin 24->20, EKG without acute ischemic changes.  She was given Hydralazine 10mg  without improvement, so cardene gtt started and PCCM consulted for admission.   Past Medical History   has a past medical history of Abdominal aortic atherosclerosis ([place]) (by CT 02/2014), Acute kidney injury ([place]) (02/29/2016), CAD (coronary artery disease), Diabetes type 2, uncontrolled ([place]), Frequent headaches, GERD (gastroesophageal reflux disease), Gout, History of pulmonary embolism (2012), HLD (hyperlipidemia), HTN (hypertension), Internal capsule hemorrhage ([place]), Morbid obesity ([place]), Myocardial infarction ([place]) (2012), Osteoarthritis, Primary localized osteoarthritis of left knee (09/22/2014), Risk for falls (09/28/2017), Sleep apnea (2011), Stroke [facility]), Syncope (03/01/2016), Thoracic aortic atherosclerosis ([place]) (12/2015), and Vertigo.   Significant Hospital Events   11/23 admit to  PCCM  Consults:    Procedures:    Significant Diagnostic Tests:  11/23 CT head>>Atrophy with small vessel chronic ischemic changes of deep cerebral white matter  11/23 MRI brain>> No acute intracranial abnormality.  Multiple old cerebellar small vessel infarcts and findings of chronic ischemic microangiopathy.  Multiple predominantly central chronic microhemorrhages most consistent with chronic hypertensive angiopathy.  11/23 TTE >>LVEF 65-70%; worsening diastolic function, N0UV, no wall motion abnormalities   Micro Data:  11/23 Covid-19 and Flu>>negative 11/23 MRSA neg  Antimicrobials:   n/a  Interim history/subjective:  Off cardene gtt since 1218 yesterday No complaints- denies any HA, vision changes, CP/ SOB, N/V No events overnight  Objective   Blood pressure (!) 185/89, pulse (!) 51, temperature 97.8 F (36.6 C), temperature source Oral, resp. rate 16, height 5\' 3"  (1.6 m), weight 90.3 kg, SpO2 98 %.        Intake/Output Summary (Last 24 hours) at 03/17/2020 1056 Last data filed at 03/17/2020 0800 Gross per 24 hour  Intake 924.69 ml  Output 1100 ml  Net -175.31 ml   Filed Weights   03/15/20 1618 03/16/20 0617 03/17/20 0500  Weight: 90.7 kg 86.7 kg 90.3 kg   General:  Very pleasant elderly female in NAD HEENT: MM pink/moist, pupils 4/reactive Neuro: Awake, mostly oriented- slight confusion with year, MAE, no focal weakness CV: NSR- SB, no murmur PULM:  Non labored, CTA GI: obese, soft, bs+ Extremities: warm/dry, no LE edema  Skin: no rashes   Resolved Hospital Problem list     Assessment & Plan:   Hypertensive Urgency, CAD, HLD MI in 2012 with last echo in 2019 showing EF 60-65% and grade 1 diastolic dysfunction; Negative stress test 2015 - unclear what precipitated HTN urgency as she reports  med compliance   - MRI brain without acute abnormality; remains without focal deficit - Off cardene since 11/23 - continue norvasc 5mg , hydralazine 50 mg BID,  as well as home losartan 100 mg, HCTZ 12.5 mg, Toprol XL 25mg    - TTE as above, slightly worsened diastolic function, LVEF 68-03%, no wall motion abnormalities  - continue plavix/ lipitor  -  transfer to telemetry   Type 2 DM, uncontrolled - HA1c 10.7 -Continue home Lantus and SSI -Hold Januvia while admitted  Renal Insufficiency - near baseline of 1.15, today is 1.19 -Continue to follow renal indices   Best practice:  Diet: diabetic Pain/Anxiety/Delirium protocol (if indicated): n/a VAP protocol (if indicated): n/a DVT prophylaxis: heparin sq GI prophylaxis: n/a Glucose control: SSI, Lantus Mobility: with assist/ PT/OT Code Status: full code Family Communication: no family at bedside.  Patient updated on plan of care.  Disposition: tx telemetry   Labs   CBC: Recent Labs  Lab 03/15/20 1700 03/16/20 0537 03/17/20 0039  WBC 7.8 10.0 10.7*  NEUTROABS 5.1  --   --   HGB 12.0 13.1 12.0  HCT 38.9 42.3 37.7  MCV 88.2 86.7 85.5  PLT 212 231 212    Basic Metabolic Panel: Recent Labs  Lab 03/15/20 1700 03/16/20 0537 03/17/20 0039  NA 134*  --  138  K 3.6  --  3.5  CL 98  --  100  CO2 28  --  27  GLUCOSE 367*  --  161*  BUN 17  --  29*  CREATININE 1.19* 1.07* 1.19*  CALCIUM 8.8*  --  9.4  MG  --   --  1.7  PHOS  --   --  4.0   GFR: Estimated Creatinine Clearance: 41.6 mL/min (A) (by C-G formula based on SCr of 1.19 mg/dL (H)). Recent Labs  Lab 03/15/20 1700 03/16/20 0537 03/17/20 0039  WBC 7.8 10.0 10.7*    Liver Function Tests: Recent Labs  Lab 03/15/20 1700  AST 16  ALT 17  ALKPHOS 88  BILITOT 0.8  PROT 6.7  ALBUMIN 3.3*   No results for input(s): LIPASE, AMYLASE in the last 168 hours. No results for input(s): AMMONIA in the last 168 hours.  ABG    Component Value Date/Time   PHART 7.44 08/13/2017 1647   PCO2ART 42 08/13/2017 1647   PO2ART 82 (L) 08/13/2017 1647   HCO3 28.5 (H) 08/13/2017 1647   TCO2 27 09/02/2016 1450   O2SAT 96.4  08/13/2017 1647     Coagulation Profile: Recent Labs  Lab 03/15/20 1700  INR 1.1    Cardiac Enzymes: No results for input(s): CKTOTAL, CKMB, CKMBINDEX, TROPONINI in the last 168 hours.  HbA1C: Hemoglobin A1C  Date/Time Value Ref Range Status  09/25/2019 10:07 AM 7.6 (A) 4.0 - 5.6 % Final   Hgb A1c MFr Bld  Date/Time Value Ref Range Status  03/16/2020 05:37 AM 10.7 (H) 4.8 - 5.6 % Final    Comment:    (NOTE) Pre diabetes:          5.7%-6.4%  Diabetes:              >6.4%  Glycemic control for   <7.0% adults with diabetes   03/24/2019 09:57 AM 8.2 (H) 4.6 - 6.5 % Final    Comment:    Glycemic Control Guidelines for People with Diabetes:Non Diabetic:  <6%Goal of Therapy: <7%Additional Action Suggested:  >8%     CBG: Recent Labs  Lab 03/16/20 1140 03/16/20 1523 03/16/20  2149 03/17/20 0207 03/17/20 [place], ACNP [facility] 03/17/2020, 10:56 AM  See Shea Evans for personal pager PCCM on call pager [phone]

## 2020-03-17 NOTE — Plan of Care (Signed)

## 2020-03-18 DIAGNOSIS — I160 Hypertensive urgency: Secondary | ICD-10-CM | POA: Diagnosis not present

## 2020-03-18 DIAGNOSIS — N1831 Chronic kidney disease, stage 3a: Secondary | ICD-10-CM

## 2020-03-18 LAB — GLUCOSE, CAPILLARY
Glucose-Capillary: 128 mg/dL — ABNORMAL HIGH (ref 70–99)
Glucose-Capillary: 205 mg/dL — ABNORMAL HIGH (ref 70–99)
Glucose-Capillary: 300 mg/dL — ABNORMAL HIGH (ref 70–99)
Glucose-Capillary: 179 mg/dL — ABNORMAL HIGH (ref 70–99)

## 2020-03-18 MED ORDER — ACETAMINOPHEN 325 MG PO TABS: 650.0000 mg | ORAL_TABLET | Freq: Three times a day (TID) | ORAL | Status: DC | PRN

## 2020-03-18 MED ORDER — METOCLOPRAMIDE HCL 5 MG/ML IJ SOLN
10.0000 mg | Freq: Three times a day (TID) | INTRAMUSCULAR | Status: DC | PRN
  Administered 2020-03-18: 10 mg via INTRAVENOUS
  Filled 2020-03-18: qty 2

## 2020-03-18 MED ORDER — DIPHENHYDRAMINE HCL 50 MG/ML IJ SOLN
25.0000 mg | Freq: Three times a day (TID) | INTRAMUSCULAR | Status: DC | PRN
  Administered 2020-03-18: 25 mg via INTRAVENOUS
  Filled 2020-03-18: qty 1

## 2020-03-18 MED ORDER — ACETAMINOPHEN 500 MG PO TABS
500.0000 mg | ORAL_TABLET | Freq: Four times a day (QID) | ORAL | Status: DC | PRN
  Filled 2020-03-18: qty 1

## 2020-03-18 NOTE — Assessment & Plan Note (Addendum)
MI in 2012 with last echo in 2019 showing EF 60-65% and grade 1 diastolic dysfunction; Negative stress test 2015 - unclear what precipitated HTN urgency as she reports med compliance - MRI brain without acute abnormality; remains without focal deficit - Off cardene since 11/23 - continue norvasc 5mg , hydralazine 50 mg BID, as well as home losartan 100 mg, HCTZ 12.5 mg, Toprol XL 25mg 

## 2020-03-18 NOTE — Hospital Course (Addendum)
Ms. Wunschel is a 79 yo female with PMH CAD, hypertension, PE, hyperlipidemia and stroke who presented with headache and dizziness and was found to have a blood pressure of 270/84.  She was started on a Cardene drip and initially admitted to the ICU. She endorsed compliance with her home medications and denied any recent changes in her dosages. She underwent CT head on admission which was negative for acute abnormalities.  Troponins were indeterminate and EKG was negative for ischemic changes. She was able to be weaned off of Cardene on 03/16/2020.  She was resumed back on her home regimen with adequate blood pressure control. With further monitoring, her blood pressure remained well controlled and she was considered stable for discharging home. She was recommended to continue a blood pressure log at discharge and bring with her to outpatient follow-up appointment.

## 2020-03-18 NOTE — Assessment & Plan Note (Signed)
-  Renal function at baseline.  Avoid nephrotoxic agents as able

## 2020-03-18 NOTE — Assessment & Plan Note (Signed)
-  Continue Lipitor, Plavix, losartan, Toprol

## 2020-03-18 NOTE — Assessment & Plan Note (Signed)
-   continue SSI and CBGs 

## 2020-03-18 NOTE — Progress Notes (Signed)
PROGRESS NOTE    Kristen [name]   XBM:[id number]  DOB: 1941-02-09  DOA: 03/15/2020     2  PCP: [name], MD  CC: Headache  Hospital Course: Kristen [name] is a 79 yo female with PMH CAD, hypertension, PE, hyperlipidemia and stroke who presented with headache and dizziness and was found to have a blood pressure of 270/84.  She was started on a Cardene drip and initially admitted to the ICU. She endorsed compliance with her home medications and denied any recent changes in her dosages. She underwent CT head on admission which was negative for acute abnormalities.  Troponins were indeterminate and EKG was negative for ischemic changes. She was able to be weaned off of Cardene on 03/16/2020.  She was resumed back on her home regimen with adequate blood pressure control.   Interval History:  Transferred out of the ICU yesterday.  Blood pressure is improved.  This morning she is still complaining of a headache and appears semiconfused.  Carries on conversation well but does not know orientation questions of the year when asked specifically.  Old records reviewed in assessment of this patient  ROS: Constitutional: negative for chills and fevers, Respiratory: negative for cough, Cardiovascular: negative for chest pain and Gastrointestinal: negative for abdominal pain  Assessment & Plan: * Hypertensive urgency MI in 2012 with last echo in 2019 showing EF 60-65% and grade 1 diastolic dysfunction; Negative stress test 2015 - unclear what precipitated HTN urgency as she reports med compliance   - MRI brain without acute abnormality; remains without focal deficit - Off cardene since 11/23 - continue norvasc 5mg , hydralazine 50 mg BID, as well as home losartan 100 mg, HCTZ 12.5 mg, Toprol XL 25mg    Resistant hypertension -Amlodipine 5 mg daily, hydralazine 50 mg twice daily, losartan 100 mg daily, HCTZ 12.5 mg daily, Toprol 25 mg daily -Will adjust if needed  Chronic kidney disease,  stage 3a ([place]) -Renal function at baseline.  Avoid nephrotoxic agents as able  CAD (coronary artery disease)/coronary calcifications -Continue Lipitor, Plavix, losartan, Toprol  Diabetes mellitus type 2, uncontrolled, with complications (HCC) - continue SSI and CBGs    Antimicrobials: None  DVT prophylaxis: HSQ Code Status: FULL Family Communication: none present Disposition Plan: Status is: Inpatient  Remains inpatient appropriate because:Altered mental status and Inpatient level of care appropriate due to severity of illness   Dispo: The patient is from: Home              Anticipated d/c is to: Home              Anticipated d/c date is: 1 day              Patient currently is not medically stable to d/c.  Objective: Blood pressure (!) 160/66, pulse 62, temperature 98 F (36.7 C), temperature source Oral, resp. rate 20, height 5\' 3"  (1.6 m), weight 87.6 kg, SpO2 100 %.  Examination: General appearance: Pleasant woman resting in bed in no distress but does appear slightly confused Head: Normocephalic, without obvious abnormality, atraumatic Eyes: EOMI Lungs: clear to auscultation bilaterally Heart: regular rate and rhythm and S1, S2 normal Abdomen: normal findings: bowel sounds normal and soft, non-tender Extremities: No edema Skin: mobility and turgor normal Neurologic: Grossly normal  Consultants:     Procedures:     Data Reviewed: I have personally reviewed following labs and imaging studies Results for orders placed or performed during the hospital encounter of 03/15/20 (from the past 24 hour(s))  Glucose, capillary     Status: Abnormal   Collection Time: 03/17/20  3:23 PM  Result Value Ref Range   Glucose-Capillary 227 (H) 70 - 99 mg/dL  Glucose, capillary     Status: Abnormal   Collection Time: 03/17/20  4:16 PM  Result Value Ref Range   Glucose-Capillary 248 (H) 70 - 99 mg/dL  Glucose, capillary     Status: Abnormal   Collection Time: 03/17/20  8:55  PM  Result Value Ref Range   Glucose-Capillary 278 (H) 70 - 99 mg/dL  Glucose, capillary     Status: Abnormal   Collection Time: 03/18/20  6:06 AM  Result Value Ref Range   Glucose-Capillary 128 (H) 70 - 99 mg/dL  Glucose, capillary     Status: Abnormal   Collection Time: 03/18/20 11:46 AM  Result Value Ref Range   Glucose-Capillary 205 (H) 70 - 99 mg/dL   Comment 1 Notify RN    Comment 2 Document in Chart     Recent Results (from the past 240 hour(s))  Respiratory Panel by RT PCR (Flu A&B, Covid) - Nasopharyngeal Swab     Status: None   Collection Time: 03/16/20  3:21 AM   Specimen: Nasopharyngeal Swab; Nasopharyngeal(NP) swabs in vial transport medium  Result Value Ref Range Status   SARS Coronavirus 2 by RT PCR NEGATIVE NEGATIVE Final    Comment: (NOTE) SARS-CoV-2 target nucleic acids are NOT DETECTED.  The SARS-CoV-2 RNA is generally detectable in upper respiratoy specimens during the acute phase of infection. The lowest concentration of SARS-CoV-2 viral copies this assay can detect is 131 copies/mL. A negative result does not preclude SARS-Cov-2 infection and should not be used as the sole basis for treatment or other patient management decisions. A negative result may occur with  improper specimen collection/handling, submission of specimen other than nasopharyngeal swab, presence of viral mutation(s) within the areas targeted by this assay, and inadequate number of viral copies (<131 copies/mL). A negative result must be combined with clinical observations, patient history, and epidemiological information. The expected result is Negative.  Fact Sheet for Patients:  PinkCheek.be  Fact Sheet for Healthcare Providers:  GravelBags.it  This test is no t yet approved or cleared by the [place] FDA and  has been authorized for detection and/or diagnosis of SARS-CoV-2 by FDA under an Emergency Use Authorization  (EUA). This EUA will remain  in effect (meaning this test can be used) for the duration of the COVID-19 declaration under Section 564(b)(1) of the Act, 21 U.S.C. section 360bbb-3(b)(1), unless the authorization is terminated or revoked sooner.     Influenza A by PCR NEGATIVE NEGATIVE Final   Influenza B by PCR NEGATIVE NEGATIVE Final    Comment: (NOTE) The Xpert Xpress SARS-CoV-2/FLU/RSV assay is intended as an aid in  the diagnosis of influenza from Nasopharyngeal swab specimens and  should not be used as a sole basis for treatment. Nasal washings and  aspirates are unacceptable for Xpert Xpress SARS-CoV-2/FLU/RSV  testing.  Fact Sheet for Patients: PinkCheek.be  Fact Sheet for Healthcare Providers: GravelBags.it  This test is not yet approved or cleared by the [place] FDA and  has been authorized for detection and/or diagnosis of SARS-CoV-2 by  FDA under an Emergency Use Authorization (EUA). This EUA will remain  in effect (meaning this test can be used) for the duration of the  Covid-19 declaration under Section 564(b)(1) of the Act, 21  U.S.C. section 360bbb-3(b)(1), unless the authorization is  terminated or revoked.  Performed at [facility], [place] [address] [place], [place] [address]   MRSA PCR Screening     Status: None   Collection Time: 03/16/20  6:25 AM   Specimen: Nasopharyngeal  Result Value Ref Range Status   MRSA by PCR NEGATIVE NEGATIVE Final    Comment:        The GeneXpert MRSA Assay (FDA approved for NASAL specimens only), is one component of a comprehensive MRSA colonization surveillance program. It is not intended to diagnose MRSA infection nor to guide or monitor treatment for MRSA infections. Performed at [facility], [place] [address], [place], [place] [address]      Radiology Studies: No results found. DG CHEST PORT 1 VIEW  Final Result    MR BRAIN WO CONTRAST    Final Result    CT HEAD WO CONTRAST  Final Result      Scheduled Meds: . amLODipine  5 mg Oral Daily  . atorvastatin  40 mg Oral Daily  . Chlorhexidine Gluconate Cloth  6 each Topical Daily  . clopidogrel  75 mg Oral Daily  . heparin  5,000 Units Subcutaneous Q8H  . hydrALAZINE  50 mg Oral BID  . losartan  100 mg Oral Daily   And  . hydrochlorothiazide  12.5 mg Oral Daily  . insulin aspart  0-20 Units Subcutaneous TID PC & HS  . insulin glargine  18 Units Subcutaneous Q2200  . metoprolol succinate  25 mg Oral Daily   PRN Meds: docusate sodium, polyethylene glycol Continuous Infusions:   LOS: 2 days  Time spent: Greater than 50% of the 35 minute visit was spent in counseling/coordination of care for the patient as laid out in the A&P.   Dwyane Dee, MD [facility] 03/18/2020, 1:16 PM

## 2020-03-18 NOTE — Assessment & Plan Note (Signed)
-  Amlodipine 5 mg daily, hydralazine 50 mg twice daily, losartan 100 mg daily, HCTZ 12.5 mg daily, Toprol 25 mg daily -Will adjust if needed

## 2020-03-19 DIAGNOSIS — I160 Hypertensive urgency: Secondary | ICD-10-CM | POA: Diagnosis not present

## 2020-03-19 LAB — BASIC METABOLIC PANEL
Anion gap: 12 (ref 5–15)
BUN: 33 mg/dL — ABNORMAL HIGH (ref 8–23)
CO2: 25 mmol/L (ref 22–32)
Calcium: 9 mg/dL (ref 8.9–10.3)
Chloride: 101 mmol/L (ref 98–111)
Creatinine, Ser: 1.33 mg/dL — ABNORMAL HIGH (ref 0.44–1.00)
GFR, Estimated: 41 mL/min — ABNORMAL LOW (ref >60)
Glucose, Bld: 108 mg/dL — ABNORMAL HIGH (ref 70–99)
Potassium: 3.4 mmol/L — ABNORMAL LOW (ref 3.5–5.1)
Sodium: 138 mmol/L (ref 135–145)

## 2020-03-19 LAB — CBC WITH DIFFERENTIAL/PLATELET
Abs Immature Granulocytes: 0.02 10*3/uL (ref 0.00–0.07)
Basophils Absolute: 0 10*3/uL (ref 0.0–0.1)
Basophils Relative: 0 %
Eosinophils Absolute: 0 10*3/uL (ref 0.0–0.5)
Eosinophils Relative: 0 %
HCT: 36.3 % (ref 36.0–46.0)
Hemoglobin: 11.4 g/dL — ABNORMAL LOW (ref 12.0–15.0)
Immature Granulocytes: 0 %
Lymphocytes Relative: 41 %
Lymphs Abs: 2.9 10*3/uL (ref 0.7–4.0)
MCH: 27.1 pg (ref 26.0–34.0)
MCHC: 31.4 g/dL (ref 30.0–36.0)
MCV: 86.4 fL (ref 80.0–100.0)
Monocytes Absolute: 0.5 10*3/uL (ref 0.1–1.0)
Monocytes Relative: 7 %
Neutro Abs: 3.7 10*3/uL (ref 1.7–7.7)
Neutrophils Relative %: 52 %
Platelets: 214 10*3/uL (ref 150–400)
RBC: 4.2 MIL/uL (ref 3.87–5.11)
RDW: 15.2 % (ref 11.5–15.5)
WBC: 7.1 10*3/uL (ref 4.0–10.5)
nRBC: 0 % (ref 0.0–0.2)

## 2020-03-19 LAB — MAGNESIUM: Magnesium: 1.8 mg/dL (ref 1.7–2.4)

## 2020-03-19 LAB — GLUCOSE, CAPILLARY: Glucose-Capillary: 125 mg/dL — ABNORMAL HIGH (ref 70–99)

## 2020-03-19 MED ORDER — POTASSIUM CHLORIDE CRYS ER 20 MEQ PO TBCR
40.0000 meq | EXTENDED_RELEASE_TABLET | Freq: Once | ORAL | Status: AC
  Administered 2020-03-19: 40 meq via ORAL
  Filled 2020-03-19: qty 2

## 2020-03-19 MED ORDER — HYDRALAZINE HCL 50 MG PO TABS: 50.0000 mg | ORAL_TABLET | Freq: Two times a day (BID) | ORAL | 3 refills | Status: DC

## 2020-03-19 MED ORDER — AMLODIPINE BESYLATE 5 MG PO TABS
5.0000 mg | ORAL_TABLET | Freq: Every day | ORAL | 3 refills | Status: DC
Start: 2020-03-19 — End: 2020-03-30

## 2020-03-19 NOTE — Progress Notes (Signed)
D/C instructions given and reviewed. No questions asked but encouraged to call with any concerns. IV removed, tolerated well. 

## 2020-03-19 NOTE — Plan of Care (Signed)
  Problem: Metabolic: Goal: Ability to maintain appropriate glucose levels will improve Outcome: Progressing   Problem: Skin Integrity: Goal: Risk for impaired skin integrity will decrease Outcome: Progressing   

## 2020-03-20 NOTE — Discharge Summary (Signed)
Physician Discharge Summary   [name] NOB:[id number] DOB: 1940/12/14 DOA: 03/15/2020  PCP: [name], MD  Admit date: 03/15/2020 Discharge date: 03/20/2020  Admitted From: home Disposition:  home Discharging physician: Dwyane Dee, MD  Recommendations for Outpatient Follow-up:  1. Review BP log   Patient discharged to home in Discharge Condition: stable CODE STATUS: Full Diet recommendation:  Diet Orders (From admission, onward)    Start     Ordered   03/19/20 0000  Diet - low sodium heart healthy        03/19/20 1050   03/19/20 0000  Diet Carb Modified        03/19/20 1050          Hospital Course: Kristen [name] is a 79 yo female with PMH CAD, hypertension, PE, hyperlipidemia and stroke who presented with headache and dizziness and was found to have a blood pressure of 270/84.  She was started on a Cardene drip and initially admitted to the ICU. She endorsed compliance with her home medications and denied any recent changes in her dosages. She underwent CT head on admission which was negative for acute abnormalities.  Troponins were indeterminate and EKG was negative for ischemic changes. She was able to be weaned off of Cardene on 03/16/2020.  She was resumed back on her home regimen with adequate blood pressure control. With further monitoring, her blood pressure remained well controlled and she was considered stable for discharging home. She was recommended to continue a blood pressure log at discharge and bring with her to outpatient follow-up appointment.   * Hypertensive urgency MI in 2012 with last echo in 2019 showing EF 60-65% and grade 1 diastolic dysfunction; Negative stress test 2015 - unclear what precipitated HTN urgency as she reports med compliance - MRI brain without acute abnormality; remains without focal deficit - Off cardene since 11/23 - continue norvasc 83m, hydralazine 50 mg BID, as well as home losartan 100 mg, HCTZ 12.5 mg,  Toprol XL 211m  Resistant hypertension -Amlodipine 5 mg daily, hydralazine 50 mg twice daily, losartan 100 mg daily, HCTZ 12.5 mg daily, Toprol 25 mg daily -Will adjust if needed  Chronic [place] disease, stage 3a ([place] function at baseline.  Avoid nephrotoxic agents as able  CAD (coronary artery disease)/coronary calcifications -Continue Lipitor, Plavix, losartan, Toprol  Diabetes mellitus type 2, uncontrolled, with complications (HCC) - continue SSI and CBGs    The patient's chronic medical conditions were treated accordingly per the patient's home medication regimen except as noted.  On day of discharge, patient was felt deemed stable for discharge. Patient/family member advised to call PCP or come back to ER if needed.   Principal Diagnosis: Hypertensive urgency  Discharge Diagnoses: Active Hospital Problems   Diagnosis Date Noted  . Hypertensive urgency 03/16/2020    Priority: High  . Resistant hypertension 05/13/2009    Priority: High  . Chronic kidney disease, stage 3a ([facility]  . CAD (coronary artery disease)/coronary calcifications   . [facility] type [facility], [facility], [facility]    Resolved Hospital Problems  No resolved problems to display.    Discharge Instructions    Diet - low sodium heart healthy   Complete by: As directed    Diet Carb Modified   Complete by: As directed    Discharge instructions   Complete by: As directed    Check your blood pressure twice daily and bring log with you to your followup appointment   Increase activity  slowly   Complete by: As directed      Allergies as of 03/19/2020      Reactions   Peanut-containing Drug Products Itching   Acetaminophen Other (See Comments)   "Causes me to spit up blood"   Aleve [naproxen Sodium] Other (See Comments)   Spits up blood   Doxycycline Other (See Comments)   Malaise, GI upset, "felt drunk" and very ill   Metformin And Related Other (See Comments)     Chills, dizziness   Penicillins Rash   Has patient had a PCN reaction causing immediate rash, facial/tongue/throat swelling, SOB or lightheadedness with hypotension: Yes Has patient had a PCN reaction causing severe rash involving mucus membranes or skin necrosis: No Has patient had a PCN reaction that required hospitalization No Has patient had a PCN reaction occurring within the last 10 years: No If all of the above answers are "NO", then may proceed with Cephalosporin use.      Medication List    STOP taking these medications   Colchicine 0.6 MG Caps Commonly known as: Mitigare   ibuprofen 400 MG tablet Commonly known as: ADVIL     TAKE these medications   amLODipine 5 MG tablet Commonly known as: NORVASC Take 1 tablet (5 mg total) by mouth daily.   atorvastatin 40 MG tablet Commonly known as: LIPITOR TAKE 1 TABLET BY MOUTH EVERY DAY   B-D UF III MINI PEN NEEDLES 31G X 5 MM Misc Generic drug: Insulin Pen Needle INJECT 1 EACH AS DIRECTED AT BEDTIME. USE AS DIRECTED TO INJECT INSULIN. DX CODE: E11.8, E11.65   blood glucose meter kit and supplies Kit Dispense based on patient and insurance preference. Use up to four times daily as directed. (FOR ICD-9 250.00, 250.01).   clopidogrel 75 MG tablet Commonly known as: PLAVIX Take 1 tablet (75 mg total) by mouth daily.   hydrALAZINE 50 MG tablet Commonly known as: APRESOLINE Take 1 tablet (50 mg total) by mouth 2 (two) times daily.   Lantus SoloStar 100 UNIT/ML Solostar Pen Generic drug: insulin glargine INJECT 18 UNITS INTO THE SKIN AT BEDTIME. FOR 3 MONTHS What changed: See the new instructions.   losartan-hydrochlorothiazide 100-12.5 MG tablet Commonly known as: HYZAAR Take 1 tablet by mouth daily.   metoprolol succinate 25 MG 24 hr tablet Commonly known as: TOPROL-XL TAKE 1 TABLET BY MOUTH EVERY DAY   nitroGLYCERIN 0.4 MG SL tablet Commonly known as: NITROSTAT Place 0.4 mg under the tongue every 5 (five)  minutes as needed for chest pain.   OneTouch Delica Lancets Fine Misc USE U PTO 4 TIMES DAILY AS DIRECTED   OneTouch Ultra test strip Generic drug: glucose blood USE UP TO 4 TIMES DAILY AS DIRECTED   potassium chloride 10 MEQ tablet Commonly known as: KLOR-CON Take 1 tablet (10 mEq total) by mouth every Monday, Wednesday, and Friday.   sitaGLIPtin 50 MG tablet Commonly known as: Januvia Take 1 tablet (50 mg total) by mouth daily.   Vitamin D 50 MCG (2000 UT) Caps Take 1 capsule by mouth daily.       Allergies  Allergen Reactions  . Peanut-Containing Drug Products Itching  . Acetaminophen Other (See Comments)    "Causes me to spit up blood"  . Aleve [Naproxen Sodium] Other (See Comments)    Spits up blood  . Doxycycline Other (See Comments)    Malaise, GI upset, "felt drunk" and very ill  . Metformin And Related Other (See Comments)    Chills, dizziness  .  Penicillins Rash    Has patient had a PCN reaction causing immediate rash, facial/tongue/throat swelling, SOB or lightheadedness with hypotension: Yes Has patient had a PCN reaction causing severe rash involving mucus membranes or skin necrosis: No Has patient had a PCN reaction that required hospitalization No Has patient had a PCN reaction occurring within the last 10 years: No If all of the above answers are "NO", then may proceed with Cephalosporin use.     Consultations: none  Discharge Exam: BP (!) 143/41 (BP Location: Left Arm)   Pulse (!) 58   Temp 98.1 F (36.7 C) (Oral)   Resp 18   Ht '5\' 3"'  (1.6 m)   Wt 88.1 kg   SpO2 97%   BMI 34.41 kg/m  General appearance: Pleasant woman resting in bed in no distress Head: Normocephalic, without obvious abnormality, atraumatic Eyes: EOMI Lungs: clear to auscultation bilaterally Heart: regular rate and rhythm and S1, S2 normal Abdomen: normal findings: bowel sounds normal and soft, non-tender Extremities: No edema Skin: mobility and turgor  normal Neurologic: Grossly normal  The results of significant diagnostics from this hospitalization (including imaging, microbiology, ancillary and laboratory) are listed below for reference.   Microbiology: Recent Results (from the past 240 hour(s))  Respiratory Panel by RT PCR (Flu A&B, Covid) - Nasopharyngeal Swab     Status: None   Collection Time: 03/16/20  3:21 AM   Specimen: Nasopharyngeal Swab; Nasopharyngeal(NP) swabs in vial transport medium  Result Value Ref Range Status   SARS Coronavirus 2 by RT PCR NEGATIVE NEGATIVE Final    Comment: (NOTE) SARS-CoV-2 target nucleic acids are NOT DETECTED.  The SARS-CoV-2 RNA is generally detectable in upper respiratoy specimens during the acute phase of infection. The lowest concentration of SARS-CoV-2 viral copies this assay can detect is 131 copies/mL. A negative result does not preclude SARS-Cov-2 infection and should not be used as the sole basis for treatment or other patient management decisions. A negative result may occur with  improper specimen collection/handling, submission of specimen other than nasopharyngeal swab, presence of viral mutation(s) within the areas targeted by this assay, and inadequate number of viral copies (<131 copies/mL). A negative result must be combined with clinical observations, patient history, and epidemiological information. The expected result is Negative.  Fact Sheet for Patients:  PinkCheek.be  Fact Sheet for Healthcare Providers:  GravelBags.it  This test is no t yet approved or cleared by the [place] FDA and  has been authorized for detection and/or diagnosis of SARS-CoV-2 by FDA under an Emergency Use Authorization (EUA). This EUA will remain  in effect (meaning this test can be used) for the duration of the COVID-19 declaration under Section 564(b)(1) of the Act, 21 U.S.C. section 360bbb-3(b)(1), unless the authorization is  terminated or revoked sooner.     Influenza A by PCR NEGATIVE NEGATIVE Final   Influenza B by PCR NEGATIVE NEGATIVE Final    Comment: (NOTE) The Xpert Xpress SARS-CoV-2/FLU/RSV assay is intended as an aid in  the diagnosis of influenza from Nasopharyngeal swab specimens and  should not be used as a sole basis for treatment. Nasal washings and  aspirates are unacceptable for Xpert Xpress SARS-CoV-2/FLU/RSV  testing.  Fact Sheet for Patients: PinkCheek.be  Fact Sheet for Healthcare Providers: GravelBags.it  This test is not yet approved or cleared by the [place] FDA and  has been authorized for detection and/or diagnosis of SARS-CoV-2 by  FDA under an Emergency Use Authorization (EUA). This EUA will remain  in  effect (meaning this test can be used) for the duration of the  Covid-19 declaration under Section 564(b)(1) of the Act, 21  U.S.C. section 360bbb-3(b)(1), unless the authorization is  terminated or revoked. Performed at [facility], [place] [address], [place], [place] [address]   MRSA PCR Screening     Status: None   Collection Time: 03/16/20  6:25 AM   Specimen: Nasopharyngeal  Result Value Ref Range Status   MRSA by PCR NEGATIVE NEGATIVE Final    Comment:        The GeneXpert MRSA Assay (FDA approved for NASAL specimens only), is one component of a comprehensive MRSA colonization surveillance program. It is not intended to diagnose MRSA infection nor to guide or monitor treatment for MRSA infections. Performed at [facility], [place] [address] [place], [place] [address]      Labs: BNP (last 3 results) No results for input(s): BNP in the last 8760 hours. Basic Metabolic Panel: Recent Labs  Lab 03/15/20 1700 03/16/20 0537 03/17/20 0039 03/19/20 0326  NA 134*  --  138 138  K 3.6  --  3.5 3.4*  CL 98  --  100 101  CO2 28  --  27 25  GLUCOSE 367*  --  161* 108*  BUN 17  --  29* 33*   CREATININE 1.19* 1.07* 1.19* 1.33*  CALCIUM 8.8*  --  9.4 9.0  MG  --   --  1.7 1.8  PHOS  --   --  4.0  --    Liver Function Tests: Recent Labs  Lab 03/15/20 1700  AST 16  ALT 17  ALKPHOS 88  BILITOT 0.8  PROT 6.7  ALBUMIN 3.3*   No results for input(s): LIPASE, AMYLASE in the last 168 hours. No results for input(s): AMMONIA in the last 168 hours. CBC: Recent Labs  Lab 03/15/20 1700 03/16/20 0537 03/17/20 0039 03/19/20 0326  WBC 7.8 10.0 10.7* 7.1  NEUTROABS 5.1  --   --  3.7  HGB 12.0 13.1 12.0 11.4*  HCT 38.9 42.3 37.7 36.3  MCV 88.2 86.7 85.5 86.4  PLT 212 231 231 214   Cardiac Enzymes: No results for input(s): CKTOTAL, CKMB, CKMBINDEX, TROPONINI in the last 168 hours. BNP: Invalid input(s): POCBNP CBG: Recent Labs  Lab 03/18/20 0606 03/18/20 1146 03/18/20 1635 03/18/20 2105 03/19/20 0612  GLUCAP 128* 205* 300* 179* 125*   D-Dimer No results for input(s): DDIMER in the last 72 hours. Hgb A1c No results for input(s): HGBA1C in the last 72 hours. Lipid Profile No results for input(s): CHOL, HDL, LDLCALC, TRIG, CHOLHDL, LDLDIRECT in the last 72 hours. Thyroid function studies No results for input(s): TSH, T4TOTAL, T3FREE, THYROIDAB in the last 72 hours.  Invalid input(s): FREET3 Anemia work up No results for input(s): VITAMINB12, FOLATE, FERRITIN, TIBC, IRON, RETICCTPCT in the last 72 hours. Urinalysis    Component Value Date/Time   COLORURINE YELLOW (A) 08/12/2017 1315   APPEARANCEUR HAZY (A) 08/12/2017 1315   APPEARANCEUR Clear 03/10/2014 1233   LABSPEC 1.027 08/12/2017 1315   LABSPEC 1.017 03/10/2014 1233   PHURINE 5.0 08/12/2017 1315   GLUCOSEU >=500 (A) 08/12/2017 1315   GLUCOSEU Negative 03/10/2014 1233   HGBUR SMALL (A) 08/12/2017 1315   BILIRUBINUR negative 03/11/2019 1731   BILIRUBINUR Negative 03/10/2014 1233   KETONESUR 5 (A) 08/12/2017 1315   PROTEINUR Negative 03/11/2019 1731   PROTEINUR 30 (A) 08/12/2017 1315   UROBILINOGEN  0.2 03/11/2019 1731   UROBILINOGEN  1.0 04/24/2008 2109   NITRITE negative 03/11/2019 1731   NITRITE NEGATIVE 08/12/2017 1315   LEUKOCYTESUR Negative 03/11/2019 1731   LEUKOCYTESUR Negative 03/10/2014 1233   Sepsis Labs Invalid input(s): PROCALCITONIN,  WBC,  LACTICIDVEN Microbiology Recent Results (from the past 240 hour(s))  Respiratory Panel by RT PCR (Flu A&B, Covid) - Nasopharyngeal Swab     Status: None   Collection Time: 03/16/20  3:21 AM   Specimen: Nasopharyngeal Swab; Nasopharyngeal(NP) swabs in vial transport medium  Result Value Ref Range Status   SARS Coronavirus 2 by RT PCR NEGATIVE NEGATIVE Final    Comment: (NOTE) SARS-CoV-2 target nucleic acids are NOT DETECTED.  The SARS-CoV-2 RNA is generally detectable in upper respiratoy specimens during the acute phase of infection. The lowest concentration of SARS-CoV-2 viral copies this assay can detect is 131 copies/mL. A negative result does not preclude SARS-Cov-2 infection and should not be used as the sole basis for treatment or other patient management decisions. A negative result may occur with  improper specimen collection/handling, submission of specimen other than nasopharyngeal swab, presence of viral mutation(s) within the areas targeted by this assay, and inadequate number of viral copies (<131 copies/mL). A negative result must be combined with clinical observations, patient history, and epidemiological information. The expected result is Negative.  Fact Sheet for Patients:  PinkCheek.be  Fact Sheet for Healthcare Providers:  GravelBags.it  This test is no t yet approved or cleared by the [place] FDA and  has been authorized for detection and/or diagnosis of SARS-CoV-2 by FDA under an Emergency Use Authorization (EUA). This EUA will remain  in effect (meaning this test can be used) for the duration of the COVID-19 declaration under Section  564(b)(1) of the Act, 21 U.S.C. section 360bbb-3(b)(1), unless the authorization is terminated or revoked sooner.     Influenza A by PCR NEGATIVE NEGATIVE Final   Influenza B by PCR NEGATIVE NEGATIVE Final    Comment: (NOTE) The Xpert Xpress SARS-CoV-2/FLU/RSV assay is intended as an aid in  the diagnosis of influenza from Nasopharyngeal swab specimens and  should not be used as a sole basis for treatment. Nasal washings and  aspirates are unacceptable for Xpert Xpress SARS-CoV-2/FLU/RSV  testing.  Fact Sheet for Patients: PinkCheek.be  Fact Sheet for Healthcare Providers: GravelBags.it  This test is not yet approved or cleared by the [place] FDA and  has been authorized for detection and/or diagnosis of SARS-CoV-2 by  FDA under an Emergency Use Authorization (EUA). This EUA will remain  in effect (meaning this test can be used) for the duration of the  Covid-19 declaration under Section 564(b)(1) of the Act, 21  U.S.C. section 360bbb-3(b)(1), unless the authorization is  terminated or revoked. Performed at [facility], [place] [address] [place], [place] [address]   MRSA PCR Screening     Status: None   Collection Time: 03/16/20  6:25 AM   Specimen: Nasopharyngeal  Result Value Ref Range Status   MRSA by PCR NEGATIVE NEGATIVE Final    Comment:        The GeneXpert MRSA Assay (FDA approved for NASAL specimens only), is one component of a comprehensive MRSA colonization surveillance program. It is not intended to diagnose MRSA infection nor to guide or monitor treatment for MRSA infections. Performed at [facility], [place] [address] [place], [place] [address]     Procedures/Studies: CT HEAD WO CONTRAST  Result Date: 03/15/2020 CLINICAL DATA:  Headache since Friday, numbness off and  on, question mini-stroke; history of atherosclerotic disease, coronary artery disease post MI, type II diabetes  mellitus, hypertension, prior stroke EXAM: CT HEAD WITHOUT CONTRAST TECHNIQUE: Contiguous axial images were obtained from the base of the skull through the vertex without intravenous contrast. Sagittal and coronal MPR images reconstructed from axial data set. COMPARISON:  08/12/2017 FINDINGS: Brain: Generalized atrophy. Normal ventricular morphology. No midline shift or mass effect. Small vessel chronic ischemic changes of deep cerebral white matter. No intracranial hemorrhage, mass lesion, evidence of acute infarction, or extra-axial fluid collection. Vascular: Atherosclerotic calcification of internal carotid arteries at skull base. No hyperdense vessels. Skull: Intact.  Incomplete posterior arch C1. Sinuses/Orbits: Clear Other: N/A IMPRESSION: Atrophy with small vessel chronic ischemic changes of deep cerebral white matter. No acute intracranial abnormalities. Electronically Signed   By: Lavonia Dana M.D.   On: 03/15/2020 16:56   MR BRAIN WO CONTRAST  Result Date: 03/16/2020 CLINICAL DATA:  Headache EXAM: MRI HEAD WITHOUT CONTRAST TECHNIQUE: Multiplanar, multiecho pulse sequences of the brain and surrounding structures were obtained without intravenous contrast. COMPARISON:  Brain MRI 08/13/2017 FINDINGS: Brain: No acute infarct, acute hemorrhage or extra-axial collection. Multifocal hyperintense T2-weighted signal within the white matter. Normal volume of CSF spaces. Multiple chronic microhemorrhages in a predominantly central distribution. Normal midline structures. There are multiple old cerebellar small vessel infarcts. Vascular: Major flow voids are preserved. Skull and upper cervical spine: Normal calvarium and skull base. Visualized upper cervical spine and soft tissues are normal. Sinuses/Orbits:No paranasal sinus fluid levels or advanced mucosal thickening. No mastoid or middle ear effusion. Normal orbits. IMPRESSION: 1. No acute intracranial abnormality. 2. Multiple old cerebellar small vessel  infarcts and findings of chronic ischemic microangiopathy. 3. Multiple predominantly central chronic microhemorrhages most consistent with chronic hypertensive angiopathy. Electronically Signed   By: Ulyses Jarred M.D.   On: 03/16/2020 01:41   DG CHEST PORT 1 VIEW  Result Date: 03/16/2020 CLINICAL DATA:  Chest pain. EXAM: PORTABLE CHEST 1 VIEW COMPARISON:  02/20/2018. FINDINGS: Mediastinum and hilar structures normal. Heart size normal. Lungs are clear. No pleural effusion or pneumothorax. Degenerative change thoracic spine. No acute bony abnormality identified. IMPRESSION: No acute cardiopulmonary disease. Electronically Signed   By: Marcello Moores  Register   On: 03/16/2020 05:42   ECHOCARDIOGRAM COMPLETE  Result Date: 03/16/2020    ECHOCARDIOGRAM REPORT   Patient Name:   Kristen [name] Hostetter Date of Exam: 03/16/2020 Medical Rec #:  [id number]       Height:       63.0 in Accession #:    [id number]      Weight:       191.1 lb Date of Birth:  Oct 16, 1940       BSA:          1.897 m Patient Age:    40 years        BP:           164/70 mmHg Patient Gender: F               HR:           67 bpm. Exam Location:  Inpatient Procedure: 2D Echo, Cardiac Doppler and Color Doppler Indications:    Chest Pain 786.50 / R07.9  History:        Patient has prior history of Echocardiogram examinations, most                 recent 08/13/2017. CAD, Stroke, Signs/Symptoms:Chest Pain; Risk  Factors:Sleep Apnea, Hypertension, Diabetes and Dyslipidemia.                 GERD.  Sonographer:    Vickie Epley RDCS Referring Phys: [id number] [place]  1. Left ventricular ejection fraction, by estimation, is 65 to 70%. The left ventricle has normal function. The left ventricle has no regional wall motion abnormalities. There is mild concentric left ventricular hypertrophy. Left ventricular diastolic parameters are consistent with Grade I diastolic dysfunction (impaired relaxation). Elevated left atrial pressure.  2. Right  ventricular systolic function is normal. The right ventricular size is normal.  3. The mitral valve is normal in structure. No evidence of mitral valve regurgitation. No evidence of mitral stenosis.  4. The aortic valve is tricuspid. Aortic valve regurgitation is not visualized. No aortic stenosis is present.  5. The inferior vena cava is normal in size with greater than 50% respiratory variability, suggesting right atrial pressure of 3 mmHg. Comparison(s): Prior images reviewed side by side. The left ventricular diastolic function is worse (increased filling pressures). FINDINGS  Left Ventricle: Left ventricular ejection fraction, by estimation, is 65 to 70%. The left ventricle has normal function. The left ventricle has no regional wall motion abnormalities. The left ventricular internal cavity size was normal in size. There is  mild concentric left ventricular hypertrophy. Left ventricular diastolic parameters are consistent with Grade I diastolic dysfunction (impaired relaxation). Elevated left atrial pressure. Right Ventricle: The right ventricular size is normal. No increase in right ventricular wall thickness. Right ventricular systolic function is normal. Left Atrium: Left atrial size was normal in size. Right Atrium: Right atrial size was normal in size. Pericardium: There is no evidence of pericardial effusion. Mitral Valve: The mitral valve is normal in structure. Mild mitral annular calcification. No evidence of mitral valve regurgitation. No evidence of mitral valve stenosis. Tricuspid Valve: The tricuspid valve is normal in structure. Tricuspid valve regurgitation is not demonstrated. No evidence of tricuspid stenosis. Aortic Valve: The aortic valve is tricuspid. Aortic valve regurgitation is not visualized. No aortic stenosis is present. Pulmonic Valve: The pulmonic valve was normal in structure. Pulmonic valve regurgitation is not visualized. No evidence of pulmonic stenosis. Aorta: The aortic root  is normal in size and structure. Venous: The inferior vena cava is normal in size with greater than 50% respiratory variability, suggesting right atrial pressure of 3 mmHg. IAS/Shunts: No atrial level shunt detected by color flow Doppler.  LEFT VENTRICLE PLAX 2D LVIDd:         3.70 cm      Diastology LVIDs:         2.30 cm      LV e' medial:    3.02 cm/s LV PW:         1.20 cm      LV E/e' medial:  19.7 LV IVS:        1.20 cm      LV e' lateral:   5.43 cm/s LVOT diam:     2.20 cm      LV E/e' lateral: 10.9 LV SV:         90 LV SV Index:   47 LVOT Area:     3.80 cm  LV Volumes (MOD) LV vol d, MOD A2C: 109.0 ml LV vol d, MOD A4C: 88.8 ml LV vol s, MOD A2C: 41.4 ml LV vol s, MOD A4C: 38.8 ml LV SV MOD A2C:     67.6 ml LV SV MOD A4C:  88.8 ml LV SV MOD BP:      58.8 ml RIGHT VENTRICLE RV S prime:     12.70 cm/s TAPSE (M-mode): 1.8 cm LEFT ATRIUM             Index       RIGHT ATRIUM          Index LA Vol (A2C):   23.1 ml 12.18 ml/m RA Area:     9.38 cm LA Vol (A4C):   19.7 ml 10.39 ml/m RA Volume:   16.60 ml 8.75 ml/m LA Biplane Vol: 22.0 ml 11.60 ml/m  AORTIC VALVE LVOT Vmax:   114.00 cm/s LVOT Vmean:  73.300 cm/s LVOT VTI:    0.237 m  AORTA Ao Root diam: 3.20 cm MITRAL VALVE MV Area (PHT): 2.62 cm     SHUNTS MV Decel Time: 289 msec     Systemic VTI:  0.24 m MV E velocity: 59.40 cm/s   Systemic Diam: 2.20 cm MV A velocity: 104.00 cm/s MV E/A ratio:  0.57 Mihai Croitoru MD Electronically signed by Sanda Klein MD Signature Date/Time: 03/16/2020/10:47:00 AM    Final      Time coordinating discharge: Over 30 minutes    Dwyane Dee, MD  [facility] 03/20/2020, 5:22 PM

## 2020-03-22 ENCOUNTER — Telehealth: Payer: Self-pay

## 2020-03-22 ENCOUNTER — Other Ambulatory Visit: Payer: Self-pay | Admitting: *Deleted

## 2020-03-22 ENCOUNTER — Other Ambulatory Visit: Payer: Self-pay

## 2020-03-22 NOTE — Patient Outreach (Signed)
[facility] Care Management  03/22/2020  Kristen [name] July 06, 1940 [id number]   RN Health Coach Discipline closure. Patient has been admitted to hospital. Patient will be followed by Complex Care Coordinator once discharged.  Plan: RN Health Coach Discipline Closure Discipline Closure letter sent to PCP  [facility] Management [phone]

## 2020-03-22 NOTE — Telephone Encounter (Signed)
Transition Care Management Follow-up Telephone Call  Date of discharge and from where: [facility]   How have you been since you were released from the hospital? Patient says she is doing much better.   Any questions or concerns? No  Items Reviewed:  Did the pt receive and understand the discharge instructions provided? Yes   Medications obtained and verified? Yes   Other? No   Any new allergies since your discharge? No   Dietary orders reviewed? Yes  Do you have support at home? Yes   Home Care and Equipment/Supplies: Were home health services ordered? not applicable If so, what is the name of the agency? N/A  Has the agency set up a time to come to the patient's home? not applicable Were any new equipment or medical supplies ordered?  No What is the name of the medical supply agency? N/A Were you able to get the supplies/equipment? not applicable Do you have any questions related to the use of the equipment or supplies? No  Functional Questionnaire: (I = Independent and D = Dependent) ADLs: I  Bathing/Dressing- I  Meal Prep- I  Eating- I  Maintaining continence- I  Transferring/Ambulation- I  Managing Meds- I  Follow up appointments reviewed:   PCP Hospital f/u appt confirmed? Yes  Scheduled to see Dr. [name] on 03/30/2020 @ 10 am.  [facility] f/u appt confirmed? N/A   Are transportation arrangements needed? No   If their condition worsens, is the pt aware to call PCP or go to the Emergency Dept.? Yes  Was the patient provided with contact information for the PCP's office or ED? Yes  Was to pt encouraged to call back with questions or concerns? Yes

## 2020-03-24 ENCOUNTER — Other Ambulatory Visit: Payer: Self-pay | Admitting: *Deleted

## 2020-03-24 ENCOUNTER — Encounter: Payer: Self-pay | Admitting: *Deleted

## 2020-03-24 NOTE — Patient Outreach (Signed)
[facility] [facility]) Care Management [facility] CM Telephone Outreach, new referral PCP office completes Transition of Care outreach post-hospital discharge Post-hospital discharge day # 5 Unsuccessful initial outreach attempt  03/24/2020  Kristen [name] 09/22/1940 [id number]  Unsuccessful initial telephone outreach to [facility], 79 y/o female referred to [place] 03/22/20 by [facility] Liaison after patient experienced recent hospitalization November 23-26, 2021 for HTN urgency.  Patient was previously active with [facility]; she was discharged homt to self-care without home health services.  Patient has history including, but not limited to, CAD; HTN/ HLD; CVA; CKD-III; DM-II- with most recent A1-C of 10.7 (03/16/20); patients previous A1-C was 7.6 (09/25/19).  With call attempt today, phone appeared to have been answered but there was no response on other end of line; immediate call-back elicited same results; unable to leave patient HIPAA compliant voice message, requesting call back.  Plan:  Will place [facility] CM unsuccessful patient outreach letter in mail requesting call back in writing  Will re-attempt [facility] CM telephone outreach within 4 business days if I do not hear back from patient first  Oneta Rack, RN, BSN, [employer] Coordinator [facility] Care Management  [phone]

## 2020-03-29 ENCOUNTER — Telehealth: Payer: Self-pay

## 2020-03-29 ENCOUNTER — Other Ambulatory Visit: Payer: Self-pay | Admitting: Family Medicine

## 2020-03-29 DIAGNOSIS — N1831 Chronic kidney disease, stage 3a: Secondary | ICD-10-CM

## 2020-03-29 DIAGNOSIS — Z1159 Encounter for screening for other viral diseases: Secondary | ICD-10-CM

## 2020-03-29 DIAGNOSIS — E785 Hyperlipidemia, unspecified: Secondary | ICD-10-CM

## 2020-03-29 DIAGNOSIS — E1169 Type 2 diabetes mellitus with other specified complication: Secondary | ICD-10-CM

## 2020-03-29 DIAGNOSIS — E039 Hypothyroidism, unspecified: Secondary | ICD-10-CM

## 2020-03-29 DIAGNOSIS — M1A09X0 Idiopathic chronic gout, multiple sites, without tophus (tophi): Secondary | ICD-10-CM

## 2020-03-29 DIAGNOSIS — E559 Vitamin D deficiency, unspecified: Secondary | ICD-10-CM

## 2020-03-29 NOTE — Chronic Care Management (AMB) (Signed)
Chronic Care Management Pharmacy Assistant   Name: Kristen [name]  MRN: [id number] DOB: [birth year]  Reason for Encounter: Medication Review    PCP : [name], MD  Allergies:   Allergies  Allergen Reactions   Peanut-Containing Drug Products Itching   Acetaminophen Other (See Comments)    "Causes me to spit up blood"   Aleve [Naproxen Sodium] Other (See Comments)    Spits up blood   Doxycycline Other (See Comments)    Malaise, GI upset, "felt drunk" and very ill   Metformin And Related Other (See Comments)    Chills, dizziness   Penicillins Rash    Has patient had a PCN reaction causing immediate rash, facial/tongue/throat swelling, SOB or lightheadedness with hypotension: Yes Has patient had a PCN reaction causing severe rash involving mucus membranes or skin necrosis: No Has patient had a PCN reaction that required hospitalization No Has patient had a PCN reaction occurring within the last 10 years: No If all of the above answers are "NO", then may proceed with Cephalosporin use.     Medications: Outpatient Encounter Medications as of 03/29/2020  Medication Sig   amLODipine (NORVASC) 5 MG tablet Take 1 tablet (5 mg total) by mouth daily.   atorvastatin (LIPITOR) 40 MG tablet TAKE 1 TABLET BY MOUTH EVERY DAY   blood glucose meter kit and supplies KIT Dispense based on patient and insurance preference. Use up to four times daily as directed. (FOR ICD-9 250.00, 250.01).   Cholecalciferol (VITAMIN D) 2000 units CAPS Take 1 capsule by mouth daily.   clopidogrel (PLAVIX) 75 MG tablet Take 1 tablet (75 mg total) by mouth daily.   hydrALAZINE (APRESOLINE) 50 MG tablet Take 1 tablet (50 mg total) by mouth 2 (two) times daily.   Insulin Pen Needle (B-D UF III MINI PEN NEEDLES) 31G X 5 MM MISC INJECT 1 EACH AS DIRECTED AT BEDTIME. USE AS DIRECTED TO INJECT INSULIN. DX CODE: E11.8, E11.65   LANTUS SOLOSTAR 100 UNIT/ML Solostar Pen INJECT 18 UNITS INTO THE SKIN  AT BEDTIME. FOR 3 MONTHS (Patient taking differently: Inject 18 Units into the skin at bedtime. )   losartan-hydrochlorothiazide (HYZAAR) 100-12.5 MG tablet Take 1 tablet by mouth daily.   metoprolol succinate (TOPROL-XL) 25 MG 24 hr tablet TAKE 1 TABLET BY MOUTH EVERY DAY   nitroGLYCERIN (NITROSTAT) 0.4 MG SL tablet Place 0.4 mg under the tongue every 5 (five) minutes as needed for chest pain.   ONETOUCH DELICA LANCETS FINE MISC USE U PTO 4 TIMES DAILY AS DIRECTED   ONETOUCH ULTRA test strip USE UP TO 4 TIMES DAILY AS DIRECTED   potassium chloride (KLOR-CON) 10 MEQ tablet Take 1 tablet (10 mEq total) by mouth every Monday, Wednesday, and Friday.   sitaGLIPtin (JANUVIA) 50 MG tablet Take 1 tablet (50 mg total) by mouth daily.   No facility-administered encounter medications on file as of 03/29/2020.    Current Diagnosis: Patient Active Problem List   Diagnosis Date Noted   Chronic kidney disease, stage 3a ([place]) 03/18/2020   Hypertensive urgency 03/16/2020   Bad headache    Abnormal TSH 09/25/2019   Hip osteoarthritis 03/11/2019   Pain due to onychomycosis of toenails of both feet 02/10/2019   Type 2 diabetes mellitus with vascular disease ([place]) 02/10/2019   Renal insufficiency 03/18/2018   Polypharmacy 02/04/2018   Urinary urgency 10/29/2017   Seizure ([place]) 09/11/2017   Aphasia    Borderline hypothyroidism 03/12/2017   Disturbance of skin sensation 09/21/2016  Hemiparesis of right dominant side ([place]) 09/02/2016   Intertrigo 08/16/2016   Vitamin D deficiency 08/08/2016   Gout 03/24/2016   Syncope 02/29/2016   Concussion 02/29/2016   Arthritis of left wrist 02/07/2016   Hemoptysis 12/31/2015   Thoracic aortic atherosclerosis ([place]) 12/24/2015   History of CVA (cerebrovascular accident) 11/03/2015   Abdominal aortic atherosclerosis ([place])    Anemia 06/18/2015   Sensorineural hearing loss (SNHL) of both ears 03/29/2015   Pain of right middle  finger 12/22/2014   Memory deficit 12/04/2014   Primary osteoarthritis of left knee 01/27/2014   Advanced care planning/counseling discussion 12/11/2013   Medicare annual wellness visit, subsequent 12/11/2013   Health maintenance examination 12/11/2013   CAD (coronary artery disease)/coronary calcifications    Chest pain 07/17/2013   Benign paroxysmal positional vertigo 05/26/2013   Hyperlipidemia associated with type 2 diabetes mellitus (HCC)    Severe obesity (BMI 35.0-39.9) with comorbidity ([place])    History of pulmonary embolism    Osteoarthritis    Diabetes mellitus type 2, uncontrolled, with complications ([place])    History of DVT (deep vein thrombosis) 12/21/2010   Resistant hypertension 05/13/2009   Ms. Keegan was contacted regarding their patient assistance forms for Januvia and Engineer, agricultural for the year 2022. She was instructed to come to Dr. [name] office to sign the application and provide income verification. Patient stated they would come by tomorrow to sign the forms . Forms have been completed and uploaded to 2022 pending PAP.   Follow-Up:  Patient Assistance Coordination and Pharmacist Review   [name], CPP notified  Margaretmary Dys, [place] Pharmacy Assistant [phone]

## 2020-03-30 ENCOUNTER — Encounter: Payer: Self-pay | Admitting: *Deleted

## 2020-03-30 ENCOUNTER — Encounter: Payer: Self-pay | Admitting: Family Medicine

## 2020-03-30 ENCOUNTER — Other Ambulatory Visit: Payer: Self-pay

## 2020-03-30 ENCOUNTER — Other Ambulatory Visit: Payer: Self-pay | Admitting: *Deleted

## 2020-03-30 ENCOUNTER — Telehealth: Payer: Self-pay | Admitting: *Deleted

## 2020-03-30 ENCOUNTER — Ambulatory Visit (INDEPENDENT_AMBULATORY_CARE_PROVIDER_SITE_OTHER): Payer: Medicare Other | Admitting: Family Medicine

## 2020-03-30 VITALS — BP 140/78 | HR 62 | Temp 97.5°F | Ht 63.0 in | Wt 194.0 lb

## 2020-03-30 DIAGNOSIS — I10 Essential (primary) hypertension: Secondary | ICD-10-CM

## 2020-03-30 DIAGNOSIS — Z23 Encounter for immunization: Secondary | ICD-10-CM | POA: Diagnosis not present

## 2020-03-30 DIAGNOSIS — E118 Type 2 diabetes mellitus with unspecified complications: Secondary | ICD-10-CM

## 2020-03-30 DIAGNOSIS — E1159 Type 2 diabetes mellitus with other circulatory complications: Secondary | ICD-10-CM

## 2020-03-30 DIAGNOSIS — M1A09X0 Idiopathic chronic gout, multiple sites, without tophus (tophi): Secondary | ICD-10-CM | POA: Diagnosis not present

## 2020-03-30 DIAGNOSIS — E559 Vitamin D deficiency, unspecified: Secondary | ICD-10-CM | POA: Diagnosis not present

## 2020-03-30 DIAGNOSIS — I160 Hypertensive urgency: Secondary | ICD-10-CM

## 2020-03-30 DIAGNOSIS — E785 Hyperlipidemia, unspecified: Secondary | ICD-10-CM | POA: Diagnosis not present

## 2020-03-30 DIAGNOSIS — IMO0002 Reserved for concepts with insufficient information to code with codable children: Secondary | ICD-10-CM

## 2020-03-30 DIAGNOSIS — Z1159 Encounter for screening for other viral diseases: Secondary | ICD-10-CM | POA: Diagnosis not present

## 2020-03-30 DIAGNOSIS — E1169 Type 2 diabetes mellitus with other specified complication: Secondary | ICD-10-CM

## 2020-03-30 DIAGNOSIS — E039 Hypothyroidism, unspecified: Secondary | ICD-10-CM

## 2020-03-30 DIAGNOSIS — Z79899 Other long term (current) drug therapy: Secondary | ICD-10-CM

## 2020-03-30 DIAGNOSIS — E1165 Type 2 diabetes mellitus with hyperglycemia: Secondary | ICD-10-CM

## 2020-03-30 DIAGNOSIS — N1831 Chronic kidney disease, stage 3a: Secondary | ICD-10-CM

## 2020-03-30 LAB — COMPREHENSIVE METABOLIC PANEL
ALT: 21 U/L (ref 0–35)
AST: 15 U/L (ref 0–37)
Albumin: 4 g/dL (ref 3.5–5.2)
Alkaline Phosphatase: 117 U/L (ref 39–117)
BUN: 22 mg/dL (ref 6–23)
CO2: 32 mEq/L (ref 19–32)
Calcium: 9.8 mg/dL (ref 8.4–10.5)
Chloride: 96 mEq/L (ref 96–112)
Creatinine, Ser: 1.29 mg/dL — ABNORMAL HIGH (ref 0.40–1.20)
GFR: 39.72 mL/min — ABNORMAL LOW (ref >60.00)
Glucose, Bld: 444 mg/dL — ABNORMAL HIGH (ref 70–99)
Potassium: 4.4 mEq/L (ref 3.5–5.1)
Sodium: 135 mEq/L (ref 135–145)
Total Bilirubin: 1 mg/dL (ref 0.2–1.2)
Total Protein: 7.1 g/dL (ref 6.0–8.3)

## 2020-03-30 LAB — LIPID PANEL
Cholesterol: 137 mg/dL (ref 0–200)
HDL: 45.7 mg/dL (ref >39.00)
LDL Cholesterol: 69 mg/dL (ref 0–99)
NonHDL: 90.83
Total CHOL/HDL Ratio: 3
Triglycerides: 108 mg/dL (ref 0.0–149.0)
VLDL: 21.6 mg/dL (ref 0.0–40.0)

## 2020-03-30 LAB — CBC WITH DIFFERENTIAL/PLATELET
Basophils Absolute: 0 10*3/uL (ref 0.0–0.1)
Basophils Relative: 0.3 % (ref 0.0–3.0)
Eosinophils Absolute: 0 10*3/uL (ref 0.0–0.7)
Eosinophils Relative: 0.4 % (ref 0.0–5.0)
HCT: 38.4 % (ref 36.0–46.0)
Hemoglobin: 12.2 g/dL (ref 12.0–15.0)
Lymphocytes Relative: 22.5 % (ref 12.0–46.0)
Lymphs Abs: 2 10*3/uL (ref 0.7–4.0)
MCHC: 31.9 g/dL (ref 30.0–36.0)
MCV: 84.8 fl (ref 78.0–100.0)
Monocytes Absolute: 0.4 10*3/uL (ref 0.1–1.0)
Monocytes Relative: 4.7 % (ref 3.0–12.0)
Neutro Abs: 6.5 10*3/uL (ref 1.4–7.7)
Neutrophils Relative %: 72.1 % (ref 43.0–77.0)
Platelets: 237 10*3/uL (ref 150.0–400.0)
RBC: 4.52 Mil/uL (ref 3.87–5.11)
RDW: 16.1 % — ABNORMAL HIGH (ref 11.5–15.5)
WBC: 9 10*3/uL (ref 4.0–10.5)

## 2020-03-30 LAB — VITAMIN D 25 HYDROXY (VIT D DEFICIENCY, FRACTURES): VITD: 29.48 ng/mL — ABNORMAL LOW (ref 30.00–100.00)

## 2020-03-30 LAB — TSH: TSH: 3.49 u[IU]/mL (ref 0.35–4.50)

## 2020-03-30 LAB — URIC ACID: Uric Acid, Serum: 5.9 mg/dL (ref 2.4–7.0)

## 2020-03-30 LAB — T4, FREE: Free T4: 0.98 ng/dL (ref 0.60–1.60)

## 2020-03-30 MED ORDER — METOPROLOL SUCCINATE ER 25 MG PO TB24
25.0000 mg | ORAL_TABLET | Freq: Every day | ORAL | 3 refills | Status: DC
Start: 2020-03-30 — End: 2020-06-25

## 2020-03-30 MED ORDER — LANTUS SOLOSTAR 100 UNIT/ML ~~LOC~~ SOPN
18.0000 [IU] | PEN_INJECTOR | Freq: Every day | SUBCUTANEOUS | 1 refills | Status: DC
Start: 2020-03-30 — End: 2020-06-25

## 2020-03-30 MED ORDER — AMLODIPINE BESYLATE 5 MG PO TABS
5.0000 mg | ORAL_TABLET | Freq: Every day | ORAL | 3 refills | Status: DC
Start: 2020-03-30 — End: 2020-06-28

## 2020-03-30 MED ORDER — POTASSIUM CHLORIDE ER 10 MEQ PO TBCR
10.0000 meq | EXTENDED_RELEASE_TABLET | ORAL | 3 refills | Status: DC
Start: 2020-03-31 — End: 2020-06-28

## 2020-03-30 NOTE — Patient Outreach (Signed)
[place] [facility]) Care Management [facility] CM Telephone Outreach,  PCP office completes Transition of Care follow up post-hospital discharge Post-hospital discharge day # 11 Unsuccessful (consecutive) outreach attempt # 2- new referral  03/30/2020  Taffany Heiser [place] 1941/04/02 [id number]  Unsuccessful (consecutive) second telephone outreach attempt to [facility], 79 y/o female referred to [place] 03/22/20 by [facility] Liaison after patient experienced recent hospitalization November 23-26, 2021 for HTN urgency.  Patient was previously active with [facility]; she was discharged homt to self-care without home health services.  Patient has history including, but not limited to, CAD; HTN/ HLD; CVA; CKD-III; DM-II- with most recent A1-C of 10.7 (03/16/20); patients previous A1-C was 7.6 (09/25/19).  With call attempt today, phone rang multiple times without physical or voice mail pick up; unable to leave patient HIPAA compliant voice message, requesting call-back  Plan:  Verified [facility] CM unsuccessful patient outreach letter placed in mail requesting call back in writing on March 25, 2020  Will re-attempt [facility] CM telephone outreach within 4 business days if I do not hear back from patient first  Oneta Rack, RN, BSN, [employer] Coordinator [facility] Care Management  [phone]

## 2020-03-30 NOTE — Progress Notes (Signed)
Patient ID: Kristen [name], female    DOB: 12/31/1940, 79 y.o.   MRN: [id number]  This visit was conducted in person.  BP 140/78 (BP Location: Left Arm, Patient Position: Sitting, Cuff Size: Large)   Pulse 62   Temp (!) 97.5 F (36.4 C) (Temporal)   Ht 5' 3" (1.6 m)   Wt 194 lb (88 kg)   SpO2 98%   BMI 34.37 kg/m   BP Readings from Last 3 Encounters:  03/30/20 140/78  03/19/20 (!) 143/41  09/25/19 (!) 164/88    CC: hosp f/u visit  Subjective:   HPI: Kristen [name] is a 78 y.o. female presenting on 03/30/2020 for Hospitalization Follow-up (Admitted to [facility], dx hypertensive urgency. ) and Form Completion (Needs [employer] Pt Assistance re-enrollment form completed. )   Recent hospitalization for headache, dizziness, found to have hypertensive urgency with BP up to 270/84. This was despite regularly taking her antihypertensives. Initially treated with cardizem drip and admitted to ICU. Head CT returned reassuring. Non-contrasted MRI showed multiple old cerebellar infarcts, chronic ischemic microangiopathy, and chronic hypertensive angiopathy.   Current regimen remains amlodipine 3m daily, hydralazine 576mbid, losartan 10056maily, hctz 12.5mg63mily and toprol XL 25mg14mly.  She uses arm cuff at home.   Since home, has been monitoring blood pressures closely and brings log showing 158-209/80-90s, HR 50s-80s.  Since home, she also notices R leg discomfort with ambulation.   DM - ran out of lantus 1 month ago, has not yet received refill. We were not notified. Ran out of potassium a month ago as well.  Lab Results  Component Value Date   HGBA1C 10.7 (H) 03/16/2020     Admit date: 03/15/2020 Discharge date: 03/20/2020 TCM hosp f/u phone call completed 03/22/2020.  Admitted From: home Disposition:  home Discharging physician: DavidDwyane Dee Recommendations for Outpatient Follow-up:  1. Review BP log  Principal Diagnosis: Hypertensive urgency  Patient discharged to  home in Discharge Condition: stable CODE STATUS: Full     Relevant past medical, surgical, family and social history reviewed and updated as indicated. Interim medical history since our last visit reviewed. Allergies and medications reviewed and updated. Outpatient Medications Prior to Visit  Medication Sig Dispense Refill  . atorvastatin (LIPITOR) 40 MG tablet TAKE 1 TABLET BY MOUTH EVERY DAY 90 tablet 2  . blood glucose meter kit and supplies KIT Dispense based on patient and insurance preference. Use up to four times daily as directed. (FOR ICD-9 250.00, 250.01). 1 each 0  . Cholecalciferol (VITAMIN D) 2000 units CAPS Take 1 capsule by mouth daily.    . clopidogrel (PLAVIX) 75 MG tablet Take 1 tablet (75 mg total) by mouth daily. 90 tablet 3  . hydrALAZINE (APRESOLINE) 50 MG tablet Take 1 tablet (50 mg total) by mouth 2 (two) times daily. 60 tablet 3  . Insulin Pen Needle (B-D UF III MINI PEN NEEDLES) 31G X 5 MM MISC INJECT 1 EACH AS DIRECTED AT BEDTIME. USE AS DIRECTED TO INJECT INSULIN. DX CODE: E11.8, E11.65 100 each 0  . losartan-hydrochlorothiazide (HYZAAR) 100-12.5 MG tablet Take 1 tablet by mouth daily. 90 tablet 0  . nitroGLYCERIN (NITROSTAT) 0.4 MG SL tablet Place 0.4 mg under the tongue every 5 (five) minutes as needed for chest pain.    . ONEGlory RosebushCA LANCETS FINE MISC USE U PTO 4 TIMES DAILY AS DIRECTED 100 each 2  . ONETOUCH ULTRA test strip USE UP TO 4 TIMES DAILY AS DIRECTED  100 strip 3  . sitaGLIPtin (JANUVIA) 50 MG tablet Take 1 tablet (50 mg total) by mouth daily. 90 tablet 3  . amLODipine (NORVASC) 5 MG tablet Take 1 tablet (5 mg total) by mouth daily. 30 tablet 3  . LANTUS SOLOSTAR 100 UNIT/ML Solostar Pen INJECT 18 UNITS INTO THE SKIN AT BEDTIME. FOR 3 MONTHS (Patient taking differently: Inject 18 Units into the skin at bedtime. ) 15 mL 4  . metoprolol succinate (TOPROL-XL) 25 MG 24 hr tablet TAKE 1 TABLET BY MOUTH EVERY DAY 90 tablet 0  . potassium chloride  (KLOR-CON) 10 MEQ tablet Take 1 tablet (10 mEq total) by mouth every Monday, Wednesday, and Friday. 40 tablet 0   No facility-administered medications prior to visit.     Per HPI unless specifically indicated in ROS section below Review of Systems Objective:  BP 140/78 (BP Location: Left Arm, Patient Position: Sitting, Cuff Size: Large)   Pulse 62   Temp (!) 97.5 F (36.4 C) (Temporal)   Ht 5' 3" (1.6 m)   Wt 194 lb (88 kg)   SpO2 98%   BMI 34.37 kg/m   Wt Readings from Last 3 Encounters:  03/30/20 194 lb (88 kg)  03/19/20 194 lb 3.6 oz (88.1 kg)  09/25/19 205 lb 9 oz (93.2 kg)      Physical Exam Vitals and nursing note reviewed.  Constitutional:      Appearance: Normal appearance. She is not ill-appearing.  Eyes:     Extraocular Movements: Extraocular movements intact.     Pupils: Pupils are equal, round, and reactive to light.     Comments: S/p cataract extractions  Neck:     Vascular: No carotid bruit.  Cardiovascular:     Rate and Rhythm: Normal rate and regular rhythm.     Pulses: Normal pulses.     Heart sounds: Normal heart sounds. No murmur heard.   Pulmonary:     Effort: Pulmonary effort is normal. No respiratory distress.     Breath sounds: Normal breath sounds. No wheezing, rhonchi or rales.  Musculoskeletal:     Cervical back: Normal range of motion and neck supple.     Right lower leg: No edema.     Left lower leg: No edema.  Skin:    Findings: No rash.  Neurological:     Mental Status: She is alert.  Psychiatric:        Mood and Affect: Mood normal.        Behavior: Behavior normal.       Results for orders placed or performed in visit on 03/30/20  T4, free  Result Value Ref Range   Free T4 0.98 0.60 - 1.60 ng/dL  TSH  Result Value Ref Range   TSH 3.49 0.35 - 4.50 uIU/mL  CBC with Differential/Platelet  Result Value Ref Range   WBC 9.0 4.0 - 10.5 K/uL   RBC 4.52 3.87 - 5.11 Mil/uL   Hemoglobin 12.2 12.0 - 15.0 g/dL   HCT 38.4 36 - 46 %    MCV 84.8 78.0 - 100.0 fl   MCHC 31.9 30.0 - 36.0 g/dL   RDW 16.1 (H) 11.5 - 15.5 %   Platelets 237.0 150 - 400 K/uL   Neutrophils Relative % 72.1 43 - 77 %   Lymphocytes Relative 22.5 12 - 46 %   Monocytes Relative 4.7 3 - 12 %   Eosinophils Relative 0.4 0 - 5 %   Basophils Relative 0.3 0 - 3 %  Neutro Abs 6.5 1.4 - 7.7 K/uL   Lymphs Abs 2.0 0.7 - 4.0 K/uL   Monocytes Absolute 0.4 0.1 - 1.0 K/uL   Eosinophils Absolute 0.0 0.0 - 0.7 K/uL   Basophils Absolute 0.0 0.0 - 0.1 K/uL  Uric acid  Result Value Ref Range   Uric Acid, Serum 5.9 2.4 - 7.0 mg/dL  VITAMIN D 25 Hydroxy (Vit-D Deficiency, Fractures)  Result Value Ref Range   VITD 29.48 (L) 30.00 - 100.00 ng/mL  Comprehensive metabolic panel  Result Value Ref Range   Sodium 135 135 - 145 mEq/L   Potassium 4.4 3.5 - 5.1 mEq/L   Chloride 96 96 - 112 mEq/L   CO2 32 19 - 32 mEq/L   Glucose, Bld 444 (H) 70 - 99 mg/dL   BUN 22 6 - 23 mg/dL   Creatinine, Ser 1.29 (H) 0.40 - 1.20 mg/dL   Total Bilirubin 1.0 0.2 - 1.2 mg/dL   Alkaline Phosphatase 117 39 - 117 U/L   AST 15 0 - 37 U/L   ALT 21 0 - 35 U/L   Total Protein 7.1 6.0 - 8.3 g/dL   Albumin 4.0 3.5 - 5.2 g/dL   GFR 39.72 (L) >60.00 mL/min   Calcium 9.8 8.4 - 10.5 mg/dL  Lipid panel  Result Value Ref Range   Cholesterol 137 0 - 200 mg/dL   Triglycerides 108.0 0 - 149 mg/dL   HDL 45.70 >39.00 mg/dL   VLDL 21.6 0.0 - 40.0 mg/dL   LDL Cholesterol 69 0 - 99 mg/dL   Total CHOL/HDL Ratio 3    NonHDL 90.83     Assessment & Plan:  This visit occurred during the SARS-CoV-2 public health emergency.  Safety protocols were in place, including screening questions prior to the visit, additional usage of staff PPE, and extensive cleaning of exam room while observing appropriate contact time as indicated for disinfecting solutions.   Large portion of time spent speaking with local pharmacy regarding insulin prescription as well as coordinating with our [facility] pharmacist to fill out  PAP for januvia and basal acting insulin.  Problem List Items Addressed This Visit    Vitamin D deficiency   Type 2 diabetes mellitus with vascular disease ([place])    Need to work towards better sugar control.       Relevant Medications   metoprolol succinate (TOPROL-XL) 25 MG 24 hr tablet   amLODipine (NORVASC) 5 MG tablet   insulin glargine (LANTUS SOLOSTAR) 100 UNIT/ML Solostar Pen   Resistant hypertension - Primary    Continue current regimen. BP better controlled in office today, however readings remain elevated at home. No changes today, advised bring BP cuff to next week's appt to compare to our readings.  Will need to undergo secondary HTN workup in the near future including renal artery ultrasound and renin/angiotensin check (would need to hold ARB for a few days prior to measurement).       Relevant Medications   metoprolol succinate (TOPROL-XL) 25 MG 24 hr tablet   amLODipine (NORVASC) 5 MG tablet   Polypharmacy    Reports compliance with medications however h/o trouble managing medicines at home. Will see if we can provide further pharmaceutical assistance.       Hypertensive urgency    Reports compliance with medications however h/o trouble managing medicines at home. Will see if we can provide further pharmaceutical assistance.       Relevant Medications   metoprolol succinate (TOPROL-XL) 25 MG 24 hr tablet  amLODipine (NORVASC) 5 MG tablet   Hyperlipidemia associated with type 2 diabetes mellitus (HCC)   Relevant Medications   insulin glargine (LANTUS SOLOSTAR) 100 UNIT/ML Solostar Pen   Gout   Diabetes mellitus type 2, uncontrolled, with complications (HCC)    Marked worsening by A1c in hospital of >10%.  She ran out of lantus a month ago. Unclear why. She would benefit from further medication assistance and we have completed PAP application forms for [place] and basaglar for 2022. Unclear why she stopped receiving lantus in the past month. Will see if she's  willing to change pharmacy to [facility].       Relevant Medications   insulin glargine (LANTUS SOLOSTAR) 100 UNIT/ML Solostar Pen   Chronic kidney disease, stage 3a (HCC)   Borderline hypothyroidism   Relevant Medications   metoprolol succinate (TOPROL-XL) 25 MG 24 hr tablet    Other Visit Diagnoses    Need for hepatitis C screening test       Need for influenza vaccination       Relevant Orders   Flu Vaccine QUAD High Dose(Fluad) (Completed)       Meds ordered this encounter  Medications  . metoprolol succinate (TOPROL-XL) 25 MG 24 hr tablet    Sig: Take 1 tablet (25 mg total) by mouth daily.    Dispense:  90 tablet    Refill:  3  . amLODipine (NORVASC) 5 MG tablet    Sig: Take 1 tablet (5 mg total) by mouth daily.    Dispense:  90 tablet    Refill:  3  . potassium chloride (KLOR-CON) 10 MEQ tablet    Sig: Take 1 tablet (10 mEq total) by mouth every Monday, Wednesday, and Friday.    Dispense:  40 tablet    Refill:  3  . insulin glargine (LANTUS SOLOSTAR) 100 UNIT/ML Solostar Pen    Sig: Inject 18 Units into the skin at bedtime.    Dispense:  5 mL    Refill:  1    Fill 1 month supply while she gets patient assistance for insulin   Orders Placed This Encounter  Procedures  . Flu Vaccine QUAD High Dose(Fluad)     Patient Instructions  Flu shot today  Your sugars are too high - restart lantus (in the new year it may be called basagar - but it's still long acting insulin). Let us know if you run out of medicines in the future.  Bring in home blood pressure cuff to compare to ours.  Labs today.  Keep appointment for physical next week.   Follow up plan: Return if symptoms worsen or fail to improve.  [name], MD

## 2020-03-30 NOTE — Telephone Encounter (Addendum)
Hope at [facility] lab [facility]) called with critical Lab.  Pt's Glucose was 444  Results given to PCP  [facility] called back and advise me that glucose of 444 isn't considered a Critical in their book it's anything over 500 but still very elevated. PCP still advise since main lab called

## 2020-03-30 NOTE — Patient Instructions (Addendum)
Flu shot today  Your sugars are too high - restart lantus (in the new year it may be called basagar - but it's still long acting insulin). Let us know if you run out of medicines in the future.  Bring in home blood pressure cuff to compare to ours.  Labs today.  Keep appointment for physical next week.

## 2020-03-31 ENCOUNTER — Ambulatory Visit (INDEPENDENT_AMBULATORY_CARE_PROVIDER_SITE_OTHER): Payer: Medicare Other

## 2020-03-31 ENCOUNTER — Encounter: Payer: Self-pay | Admitting: Family Medicine

## 2020-03-31 ENCOUNTER — Other Ambulatory Visit: Payer: Medicare Other

## 2020-03-31 DIAGNOSIS — Z0000 Encounter for general adult medical examination without abnormal findings: Secondary | ICD-10-CM | POA: Diagnosis not present

## 2020-03-31 NOTE — Progress Notes (Signed)
Subjective:   Kristen [name] is a 79 y.o. female who presents for Medicare Annual (Subsequent) preventive examination.  Review of Systems: N/A      I connected with the patient today by telephone and verified that I am speaking with the correct person using two identifiers. Location patient: home Location nurse: work Persons participating in the telephone visit: patient, nurse.   I discussed the limitations, risks, security and privacy concerns of performing an evaluation and management service by telephone and the availability of in person appointments. I also discussed with the patient that there may be a patient responsible charge related to this service. The patient expressed understanding and verbally consented to this telephonic visit.        Cardiac Risk Factors include: advanced age (>25mn, >>54women);diabetes mellitus;hypertension;Other (see comment), Risk factor comments: hyperlipidemia     Objective:    Today's Vitals   03/31/20 1027  PainSc: 0-No pain   There is no height or weight on file to calculate BMI.  Advanced Directives 03/31/2020 03/14/2019 03/12/2018 10/31/2017 10/17/2017 09/28/2017 08/12/2017  Does Patient Have a Medical Advance Directive? Yes Yes Yes Yes No No Yes  Type of AParamedicof ASaint MarksLiving will HM[place]Living will HUmap[place]Living will HBuelltonOut of facility DNR (pink MOST or yellow form);Living will - - Living will  Does patient want to make changes to medical advance directive? - - - No - Patient declined - - No - Patient declined  Copy of HSpencerportin Chart? Yes - validated most recent copy scanned in chart (See row information) Yes - validated most recent copy scanned in chart (See row information) No - copy requested - - - -  Would patient like information on creating a medical advance directive? - - - - Yes (MAU/Ambulatory/Procedural Areas  - Information given) Yes (MAU/Ambulatory/Procedural Areas - Information given) -    Current Medications (verified) Outpatient Encounter Medications as of 03/31/2020  Medication Sig  . amLODipine (NORVASC) 5 MG tablet Take 1 tablet (5 mg total) by mouth daily.  .Marland Kitchenatorvastatin (LIPITOR) 40 MG tablet TAKE 1 TABLET BY MOUTH EVERY DAY  . blood glucose meter kit and supplies KIT Dispense based on patient and insurance preference. Use up to four times daily as directed. (FOR ICD-9 250.00, 250.01).  . Cholecalciferol (VITAMIN D) 2000 units CAPS Take 1 capsule by mouth daily.  . clopidogrel (PLAVIX) 75 MG tablet Take 1 tablet (75 mg total) by mouth daily.  . hydrALAZINE (APRESOLINE) 50 MG tablet Take 1 tablet (50 mg total) by mouth 2 (two) times daily.  . insulin glargine (LANTUS SOLOSTAR) 100 UNIT/ML Solostar Pen Inject 18 Units into the skin at bedtime.  . Insulin Pen Needle (B-D UF III MINI PEN NEEDLES) 31G X 5 MM MISC INJECT 1 EACH AS DIRECTED AT BEDTIME. USE AS DIRECTED TO INJECT INSULIN. DX CODE: E11.8, E11.65  . losartan-hydrochlorothiazide (HYZAAR) 100-12.5 MG tablet Take 1 tablet by mouth daily.  . metoprolol succinate (TOPROL-XL) 25 MG 24 hr tablet Take 1 tablet (25 mg total) by mouth daily.  . nitroGLYCERIN (NITROSTAT) 0.4 MG SL tablet Place 0.4 mg under the tongue every 5 (five) minutes as needed for chest pain.  .Glory RosebushDELICA LANCETS FINE MISC USE U PTO 4 TIMES DAILY AS DIRECTED  . ONETOUCH ULTRA test strip USE UP TO 4 TIMES DAILY AS DIRECTED  . potassium chloride (KLOR-CON) 10 MEQ tablet Take 1 tablet (10  mEq total) by mouth every Monday, Wednesday, and Friday.  . sitaGLIPtin (JANUVIA) 50 MG tablet Take 1 tablet (50 mg total) by mouth daily.   No facility-administered encounter medications on file as of 03/31/2020.    Allergies (verified) Peanut-containing drug products, Acetaminophen, Aleve [naproxen sodium], Doxycycline, Metformin and related, and Penicillins   History: Past  Medical History:  Diagnosis Date  . Abdominal aortic atherosclerosis ([place]) by CT 02/2014  . Acute kidney injury ([place]) 02/29/2016   During hospitalization 07/2017  . CAD (coronary artery disease)    by CT, per pt h/o MI  . Diabetes type 2, uncontrolled ([place])   . Frequent headaches   . GERD (gastroesophageal reflux disease)   . Gout   . History of pulmonary embolism 2012  . HLD (hyperlipidemia)   . HTN (hypertension)   . 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 Morbid obesity ([place])   . Myocardial infarction [facility]) 2012   per pt. report, states she was treated with medicine, here at [facility]    . Osteoarthritis    knees  . Primary localized osteoarthritis of left knee 09/22/2014  . Risk for falls 09/28/2017  . Sleep apnea 2011   study done in [place], states that since she lost weight she doesn't use the CPAP any longer & she doesn't have a problem with sleep apnea  . Stroke [facility])    still has balance problem on occas. , uses cane but that's mainly for the left knee pain  . Syncope 03/01/2016  . Thoracic aortic atherosclerosis ([place]) 12/2015   by CXR  . Vertigo    hx. benign postitional postural   Past Surgical History:  Procedure Laterality Date  . CATARACT EXTRACTION Bilateral 2013  . EYE SURGERY     /w IOL  . FOOT SURGERY Right   . PARTIAL HYSTERECTOMY     for fibroids, ovaries remain  . TONSILLECTOMY    . TOTAL KNEE ARTHROPLASTY Right 1990s  . TOTAL KNEE ARTHROPLASTY Left 09/22/2014   [name], MD  . TUBAL LIGATION     Family History  Problem Relation Age of Onset  . Cancer Mother        bone  . Diabetes Father   . Hypertension Father   . Cancer Son 31       lung  . Congenital heart disease Son 60  . Stroke Brother    Social History   Socioeconomic History  . Marital status: Widowed    Spouse name: Not on file  . Number of children: Not on file  . Years of education: Not  on file  . Highest education level: Not on file  Occupational History  . Not on file  Tobacco Use  . Smoking status: Never Smoker  . Smokeless tobacco: Never Used  . Tobacco comment: tobacco use- no   Vaping Use  . Vaping Use: Never used  Substance and Sexual Activity  . Alcohol use: Not Currently  . Drug use: No  . Sexual activity: Not on file  Other Topics Concern  . Not on file  Social History Narrative   Lives alone   4 grown children   Education: 11th grade   Occupation: retired, was Scientist, water quality   Activity: Does not regularly exercise.    Diet: good water, fruits/vegetables seldom   Transportation: Patient has been educated on her transportation benefit through [place]  Determinants of Health   Financial Resource Strain: Low Risk   . Difficulty of Paying Living Expenses: Not hard at all  Food Insecurity: No Food Insecurity  . Worried About Charity fundraiser in the Last Year: Never true  . Ran Out of Food in the Last Year: Never true  Transportation Needs: No Transportation Needs  . Lack of Transportation (Medical): No  . Lack of Transportation (Non-Medical): No  Physical Activity: Inactive  . Days of Exercise per Week: 0 days  . Minutes of Exercise per Session: 0 min  Stress: No Stress Concern Present  . Feeling of Stress : Not at all  Social Connections:   . Frequency of Communication with Friends and Family: Not on file  . Frequency of Social Gatherings with Friends and Family: Not on file  . Attends Religious Services: Not on file  . Active Member of Clubs or Organizations: Not on file  . Attends Archivist Meetings: Not on file  . Marital Status: Not on file    Tobacco Counseling Counseling given: Not Answered Comment: tobacco use- no    Clinical Intake:  Pre-visit preparation completed: Yes  Pain : No/denies pain Pain Score: 0-No pain     Diabetes: Yes CBG done?: No Did pt. bring in CBG monitor from home?: No  How  often do you need to have someone help you when you read instructions, pamphlets, or other written materials from your doctor or pharmacy?: 1 - Never What is the last grade level you completed in school?: 11th  Diabetic: Yes Nutrition Risk Assessment:  Has the patient had any N/V/D within the last 2 months?  No  Does the patient have any non-healing wounds?  No  Has the patient had any unintentional weight loss or weight gain?  No   Diabetes:  Is the patient diabetic?  Yes  If diabetic, was a CBG obtained today?  No , telephone visit  Did the patient bring in their glucometer from home?  No telephone visit  How often do you monitor your CBG's? sometimes.   Financial Strains and Diabetes Management:  Are you having any financial strains with the device, your supplies or your medication? No .  Does the patient want to be seen by Chronic Care Management for management of their diabetes?  No  Would the patient like to be referred to a Nutritionist or for Diabetic Management?  No   Diabetic Exams:  Diabetic Eye Exam: Completed 04/26/2019 Diabetic Foot Exam: Completed 09/25/2019   Interpreter Needed?: No  Information entered by :: CJohnson, LPN   Activities of Daily Living In your present state of health, do you have any difficulty performing the following activities: 03/31/2020  Hearing? N  Vision? N  Difficulty concentrating or making decisions? N  Walking or climbing stairs? N  Dressing or bathing? N  Doing errands, shopping? N  Preparing Food and eating ? N  Using the Toilet? N  In the past six months, have you accidently leaked urine? N  Do you have problems with loss of bowel control? N  Managing your Medications? N  Managing your Finances? N  Housekeeping or managing your Housekeeping? N  Some recent data might be hidden    Patient Care Team: [name], MD as PCP - General (Family Medicine) Rockey Situ, Kathlene November, MD as Consulting Physician (Cardiology) [name], [facility] as Pharmacist (Pharmacist) Knox Royalty, RN as [place] Management  Indicate any recent Medical Services you  may have received from other than [facility] providers in the past year (date may be approximate).     Assessment:   This is a routine wellness examination for Lanore.  Hearing/Vision screen  Hearing Screening   '125Hz'  '250Hz'  '500Hz'  '1000Hz'  '2000Hz'  '3000Hz'  '4000Hz'  '6000Hz'  '8000Hz'   Right ear:           Left ear:           Vision Screening Comments: Patient gets annual eye exams   Dietary issues and exercise activities discussed: Current Exercise Habits: The patient does not participate in regular exercise at present, Exercise limited by: None identified  Goals    . chronic health management     Starting 03/12/2018, I will continue to take medications as prescribed to manage health conditions.    . Learn More About My Health     Follow Up Date [id number]   - make a list of questions - ask questions - bring a list of my medicines to the visit - speak up when I don't understand    Why is this important?   The best way to learn about your health and care is by talking to the doctor and nurse.  They will answer your questions and give you information in the way that you like best.    Notes:     Marland Kitchen Make and Keep All Appointments     Follow Up Date [id number]   - call to cancel if needed - keep a calendar with prescription refill dates - keep a calendar with appointment dates    Why is this important?   Part of staying healthy is seeing the doctor for follow-up care.  If you forget your appointments, there are some things you can do to stay on track.    Notes:     Marland Kitchen Matintain My Quality of Life     Follow Up Date [id number]   - discuss my treatment options with the doctor or nurse    Why is this important?   Having a long-term illness can be scary.  It can also be stressful for you and your caregiver.  These steps may help.    Notes:     Marland Kitchen  Monitor and Manage My Blood Sugar     Follow Up Date [id number]   - check blood sugar at prescribed times - check blood sugar if I feel it is too high or too low - enter blood sugar readings and medication or insulin into daily log - take the blood sugar log to all doctor visits - take the blood sugar meter to all doctor visits    Why is this important?   Checking your blood sugar at home helps to keep it from getting very high or very low.  Writing the results in a diary or log helps the doctor know how to care for you.  Your blood sugar log should have the time, date and the results.  Also, write down the amount of insulin or other medicine that you take.  Other information, like what you ate, exercise done and how you were feeling, will also be helpful.     Notes:     . Obtain Eye Exam     Follow Up Date [id number]   - schedule appointment with eye doctor    Why is this important?   Eye check-ups are important when you have diabetes.  Vision loss can be prevented.    Notes:     .  Patient Stated     03/14/2019, I will maintain and continue medications as prescribed.     . Patient Stated     03/31/2020, I will maintain and continue medications as prescribed.     . Perform Foot Care     Follow Up Date [id number]   - check feet daily for cuts, sores or redness - keep feet up while sitting - trim toenails straight across - wash and dry feet carefully every day - wear comfortable, cotton socks - wear comfortable, well-fitting shoes    Why is this important?   Good foot care is very important when you have diabetes.  There are many things you can do to keep your feet healthy and catch a problem early.    Notes:     . Pharmacy Care Plan     CARE PLAN ENTRY  Current Barriers:  . Chronic Disease Management support, education, and care coordination needs related to hypertension, CAD, hyperlipidemia, type 2 diabetes, osteoarthritis, gout, vitamin D deficiency, renal  insufficiency, anemia  Pharmacist Clinical Goal(s):  Marland Kitchen Improve control of diabetes with goal A1c of less than 7%. Recommend checking blood glucose every morning before breakfast and 2 hours after your evening meal. Continue to focus on heart healthy diet and exercise. A team member will call to check on your blood sugar readings in April.  . Remain up to date on vaccinations. Recommend the shingles vaccine (Shingrix) and tetanus (Tdap) vaccine.   Interventions: . Comprehensive medication review performed.  Patient Self Care Activities:  . Self administers medications as prescribed  Initial goal documentation    . Set My Target A1C     Follow Up Date [id number]   - set target A1C    Why is this important?   Your target A1C is decided together by you and your doctor.  It is based on several things like your age and other health issues.    Notes:       Depression Screen PHQ 2/9 Scores 03/31/2020 03/14/2019 03/03/2019 09/27/2018 07/29/2018 05/27/2018 03/12/2018  PHQ - 2 Score 0 0 0 0 0 0 0  PHQ- 9 Score 0 0 - - - - 0    Fall Risk Fall Risk  03/31/2020 02/02/2020 12/19/2019 05/22/2019 03/14/2019  Falls in the past year? 0 '1 1 1 1  ' Comment - - - - tripped on rug  Number falls in past yr: 0 '1 1 1 ' 0  Comment - - - - -  Injury with Fall? 0 0 0 0 0  Risk Factor Category  - - - - -  Risk for fall due to : Medication side effect History of fall(s);Impaired balance/gait;Impaired mobility History of fall(s);Impaired balance/gait;Impaired mobility History of fall(s);Impaired balance/gait;Impaired mobility Medication side effect  Risk for fall due to: Comment - - - - -  Follow up Falls evaluation completed;Falls prevention discussed Falls evaluation completed;Falls prevention discussed Falls evaluation completed;Falls prevention discussed Falls evaluation completed;Falls prevention discussed Falls evaluation completed;Falls prevention discussed    FALL RISK PREVENTION PERTAINING TO THE HOME:  Any  stairs in or around the home? Yes  If so, are there any without handrails? No  Home free of loose throw rugs in walkways, pet beds, electrical cords, etc? Yes  Adequate lighting in your home to reduce risk of falls? Yes   ASSISTIVE DEVICES UTILIZED TO PREVENT FALLS:  Life alert? No  Use of a cane, walker or w/c? No  Grab bars in the bathroom? No  Shower chair  or bench in shower? No  Elevated toilet seat or a handicapped toilet? No   TIMED UP AND GO:  Was the test performed? N/A, telephone visit .    Cognitive Function: MMSE - Mini Mental State Exam 03/31/2020 03/14/2019 03/12/2018 08/09/2016  Orientation to time '5 5 5 5  ' Orientation to Place '5 5 5 5  ' Registration '3 3 3 3  ' Attention/ Calculation 5 0 0 0  Recall '3 3 3 3  ' Language- name 2 objects - - 0 0  Language- repeat '1 1 1 1  ' Language- follow 3 step command - - 2 3  Language- follow 3 step command-comments - - unable to follow 1 step of 3 step command -  Language- read & follow direction - - 0 0  Write a sentence - - 0 0  Copy design - - 0 0  Total score - - 19 20  Mini Cog  Mini-Cog screen was completed. Maximum score is 22. A value of 0 denotes this part of the MMSE was not completed or the patient failed this part of the Mini-Cog screening.       Immunizations Immunization History  Administered Date(s) Administered  . Fluad Quad(high Dose 65+) 03/30/2020  . PPD Test 09/25/2014  . Pneumococcal Conjugate-13 09/10/2013  . Pneumococcal Polysaccharide-23 03/10/2015  . Zoster 04/21/2014    TDAP status: Due, Education has been provided regarding the importance of this vaccine. Advised may receive this vaccine at local pharmacy or Health Dept. Aware to provide a copy of the vaccination record if obtained from local pharmacy or Health Dept. Verbalized acceptance and understanding.  Flu Vaccine status:up to date  Pneumococcal vaccine status: Up to date  Covid-19 vaccine status: Declined, Education has been provided  regarding the importance of this vaccine but patient still declined. Advised may receive this vaccine at local pharmacy or Health Dept.or vaccine clinic. Aware to provide a copy of the vaccination record if obtained from local pharmacy or Health Dept. Verbalized acceptance and understanding.  Qualifies for Shingles Vaccine? Yes   Zostavax completed Yes   Shingrix Completed?: No.    Education has been provided regarding the importance of this vaccine. Patient has been advised to call insurance company to determine out of pocket expense if they have not yet received this vaccine. Advised may also receive vaccine at local pharmacy or Health Dept. Verbalized acceptance and understanding.  Screening Tests Health Maintenance  Topic Date Due  . Hepatitis C Screening  Never done  . COVID-19 Vaccine (1) 04/16/2021 (Originally 08/07/1952)  . TETANUS/TDAP  08/10/2026 (Originally 08/07/1959)  . OPHTHALMOLOGY EXAM  04/25/2020  . HEMOGLOBIN A1C  09/13/2020  . FOOT EXAM  09/24/2020  . INFLUENZA VACCINE  Completed  . DEXA SCAN  Completed  . PNA vac Low Risk Adult  Completed    Health Maintenance  Health Maintenance Due  Topic Date Due  . Hepatitis C Screening  Never done    Colorectal cancer screening: FOBT due, will discuss with provider at physical   Mammogram status: declined  Bone Density status: declined  Lung Cancer Screening: (Low Dose CT Chest recommended if Age 43-80 years, 30 pack-year currently smoking OR have quit w/in 15 years.) does not qualify.   Additional Screening:  Hepatitis C Screening: does qualify; Completed due  Vision Screening: Recommended annual ophthalmology exams for early detection of glaucoma and other disorders of the eye. Is the patient up to date with their annual eye exam?  Yes  Who is the provider  or what is the name of the office in which the patient attends annual eye exam? [facility]  If pt is not established with a provider, would they like to  be referred to a provider to establish care? No .   Dental Screening: Recommended annual dental exams for proper oral hygiene  Community Resource Referral / Chronic Care Management: CRR required this visit?  No   CCM required this visit?  No      Plan:     I have personally reviewed and noted the following in the patient's chart:   . Medical and social history . Use of alcohol, tobacco or illicit drugs  . Current medications and supplements . Functional ability and status . Nutritional status . Physical activity . Advanced directives . List of other physicians . Hospitalizations, surgeries, and ER visits in previous 12 months . Vitals . Screenings to include cognitive, depression, and falls . Referrals and appointments  In addition, I have reviewed and discussed with patient certain preventive protocols, quality metrics, and best practice recommendations. A written personalized care plan for preventive services as well as general preventive health recommendations were provided to patient.   Due to this being a telephonic visit, the after visit summary with patients personalized plan was offered to patient via office or my-chart. Patient preferred to pick up at office at next visit or via mychart.   Andrez Grime, LPN   [id number]

## 2020-03-31 NOTE — Assessment & Plan Note (Signed)
Need to work towards better sugar control.

## 2020-03-31 NOTE — Assessment & Plan Note (Signed)
Continue current regimen. BP better controlled in office today, however readings remain elevated at home. No changes today, advised bring BP cuff to next week's appt to compare to our readings.  Will need to undergo secondary HTN workup in the near future including renal artery ultrasound and renin/angiotensin check (would need to hold ARB for a few days prior to measurement).

## 2020-03-31 NOTE — Patient Instructions (Addendum)
Kristen [name] , Thank you for taking time to come for your Medicare Wellness Visit. I appreciate your ongoing commitment to your health goals. Please review the following plan we discussed and let me know if I can assist you in the future.   Screening recommendations/referrals: Colonoscopy: FOBT due, will discuss with provider Mammogram: declined Bone Density: declined Recommended yearly ophthalmology/optometry visit for glaucoma screening and checkup Recommended yearly dental visit for hygiene and checkup  Vaccinations: Influenza vaccine: Up to date, completed 03/30/2020, due 11/2020 Pneumococcal vaccine: Completed series Tdap vaccine: declined Shingles vaccine: due, check with your insurance regarding coverage if interested   Covid-19:declined   Advanced directives: copy in chart  Conditions/risks identified: diabetes, hypertension, hyperlipidemia  Next appointment: Follow up in one year for your annual wellness visit    Preventive Care 78 Years and Older, Female Preventive care refers to lifestyle choices and visits with your health care provider that can promote health and wellness. What does preventive care include?  A yearly physical exam. This is also called an annual well check.  Dental exams once or twice a year.  Routine eye exams. Ask your health care provider how often you should have your eyes checked.  Personal lifestyle choices, including:  Daily care of your teeth and gums.  Regular physical activity.  Eating a healthy diet.  Avoiding tobacco and drug use.  Limiting alcohol use.  Practicing safe sex.  Taking low-dose aspirin every day.  Taking vitamin and mineral supplements as recommended by your health care provider. What happens during an annual well check? The services and screenings done by your health care provider during your annual well check will depend on your age, overall health, lifestyle risk factors, and family history of  disease. Counseling  Your health care provider may ask you questions about your:  Alcohol use.  Tobacco use.  Drug use.  Emotional well-being.  Home and relationship well-being.  Sexual activity.  Eating habits.  History of falls.  Memory and ability to understand (cognition).  Work and work Statistician.  Reproductive health. Screening  You may have the following tests or measurements:  Height, weight, and BMI.  Blood pressure.  Lipid and cholesterol levels. These may be checked every 5 years, or more frequently if you are over 50 years old.  Skin check.  Lung cancer screening. You may have this screening every year starting at age 78 if you have a 30-pack-year history of smoking and currently smoke or have quit within the past 15 years.  Fecal occult blood test (FOBT) of the stool. You may have this test every year starting at age 78.  Flexible sigmoidoscopy or colonoscopy. You may have a sigmoidoscopy every 5 years or a colonoscopy every 10 years starting at age 91.  Hepatitis C blood test.  Hepatitis B blood test.  Sexually transmitted disease (STD) testing.  Diabetes screening. This is done by checking your blood sugar (glucose) after you have not eaten for a while (fasting). You may have this done every 1-3 years.  Bone density scan. This is done to screen for osteoporosis. You may have this done starting at age 78.  Mammogram. This may be done every 1-2 years. Talk to your health care provider about how often you should have regular mammograms. Talk with your health care provider about your test results, treatment options, and if necessary, the need for more tests. Vaccines  Your health care provider may recommend certain vaccines, such as:  Influenza vaccine. This is recommended every  year.  Tetanus, diphtheria, and acellular pertussis (Tdap, Td) vaccine. You may need a Td booster every 10 years.  Zoster vaccine. You may need this after age  42.  Pneumococcal 13-valent conjugate (PCV13) vaccine. One dose is recommended after age 54.  Pneumococcal polysaccharide (PPSV23) vaccine. One dose is recommended after age 89. Talk to your health care provider about which screenings and vaccines you need and how often you need them. This information is not intended to replace advice given to you by your health care provider. Make sure you discuss any questions you have with your health care provider. Document Released: 05/07/2015 Document Revised: 12/29/2015 Document Reviewed: 02/09/2015 Elsevier Interactive Patient Education  2017 [employer] Prevention in the Home Falls can cause injuries. They can happen to people of all ages. There are many things you can do to make your home safe and to help prevent falls. What can I do on the outside of my home?  Regularly fix the edges of walkways and driveways and fix any cracks.  Remove anything that might make you trip as you walk through a door, such as a raised step or threshold.  Trim any bushes or trees on the path to your home.  Use bright outdoor lighting.  Clear any walking paths of anything that might make someone trip, such as rocks or tools.  Regularly check to see if handrails are loose or broken. Make sure that both sides of any steps have handrails.  Any raised decks and porches should have guardrails on the edges.  Have any leaves, snow, or ice cleared regularly.  Use sand or salt on walking paths during winter.  Clean up any spills in your garage right away. This includes oil or grease spills. What can I do in the bathroom?  Use night lights.  Install grab bars by the toilet and in the tub and shower. Do not use towel bars as grab bars.  Use non-skid mats or decals in the tub or shower.  If you need to sit down in the shower, use a plastic, non-slip stool.  Keep the floor dry. Clean up any water that spills on the floor as soon as it happens.  Remove  soap buildup in the tub or shower regularly.  Attach bath mats securely with double-sided non-slip rug tape.  Do not have throw rugs and other things on the floor that can make you trip. What can I do in the bedroom?  Use night lights.  Make sure that you have a light by your bed that is easy to reach.  Do not use any sheets or blankets that are too big for your bed. They should not hang down onto the floor.  Have a firm chair that has side arms. You can use this for support while you get dressed.  Do not have throw rugs and other things on the floor that can make you trip. What can I do in the kitchen?  Clean up any spills right away.  Avoid walking on wet floors.  Keep items that you use a lot in easy-to-reach places.  If you need to reach something above you, use a strong step stool that has a grab bar.  Keep electrical cords out of the way.  Do not use floor polish or wax that makes floors slippery. If you must use wax, use non-skid floor wax.  Do not have throw rugs and other things on the floor that can make you trip. What can  I do with my stairs?  Do not leave any items on the stairs.  Make sure that there are handrails on both sides of the stairs and use them. Fix handrails that are broken or loose. Make sure that handrails are as long as the stairways.  Check any carpeting to make sure that it is firmly attached to the stairs. Fix any carpet that is loose or worn.  Avoid having throw rugs at the top or bottom of the stairs. If you do have throw rugs, attach them to the floor with carpet tape.  Make sure that you have a light switch at the top of the stairs and the bottom of the stairs. If you do not have them, ask someone to add them for you. What else can I do to help prevent falls?  Wear shoes that:  Do not have high heels.  Have rubber bottoms.  Are comfortable and fit you well.  Are closed at the toe. Do not wear sandals.  If you use a  stepladder:  Make sure that it is fully opened. Do not climb a closed stepladder.  Make sure that both sides of the stepladder are locked into place.  Ask someone to hold it for you, if possible.  Clearly mark and make sure that you can see:  Any grab bars or handrails.  First and last steps.  Where the edge of each step is.  Use tools that help you move around (mobility aids) if they are needed. These include:  Canes.  Walkers.  Scooters.  Crutches.  Turn on the lights when you go into a dark area. Replace any light bulbs as soon as they burn out.  Set up your furniture so you have a clear path. Avoid moving your furniture around.  If any of your floors are uneven, fix them.  If there are any pets around you, be aware of where they are.  Review your medicines with your doctor. Some medicines can make you feel dizzy. This can increase your chance of falling. Ask your doctor what other things that you can do to help prevent falls. This information is not intended to replace advice given to you by your health care provider. Make sure you discuss any questions you have with your health care provider. Document Released: 02/04/2009 Document Revised: 09/16/2015 Document Reviewed: 05/15/2014 Elsevier Interactive Patient Education  2017 [employer].

## 2020-03-31 NOTE — Progress Notes (Signed)
PCP notes:  Health Maintenance: FOBT due Mammogram- due Dexa- due Covid- declined    Abnormal Screenings: none   Patient concerns: none   Nurse concerns: none   Next PCP appt.: 04/07/2020 @ 10:30 am

## 2020-03-31 NOTE — Telephone Encounter (Signed)
See result note.  

## 2020-03-31 NOTE — Assessment & Plan Note (Addendum)
Marked worsening by A1c in hospital of >10%.  She ran out of lantus a month ago. Unclear why. She would benefit from further medication assistance and we have completed PAP application forms for [place] and [place] for 2022. Unclear why she stopped receiving lantus in the past month. Will see if she's willing to change pharmacy to [facility].

## 2020-03-31 NOTE — Assessment & Plan Note (Signed)
Reports compliance with medications however h/o trouble managing medicines at home. Will see if we can provide further pharmaceutical assistance.

## 2020-04-01 ENCOUNTER — Other Ambulatory Visit: Payer: Self-pay | Admitting: *Deleted

## 2020-04-01 ENCOUNTER — Telehealth: Payer: Self-pay

## 2020-04-01 ENCOUNTER — Encounter: Payer: Self-pay | Admitting: *Deleted

## 2020-04-01 LAB — HEPATITIS C ANTIBODY
Hepatitis C Ab: NONREACTIVE
SIGNAL TO CUT-OFF: 0.02 (ref <1.00)

## 2020-04-01 LAB — FRUCTOSAMINE: Fructosamine: 497 umol/L — ABNORMAL HIGH (ref 205–285)

## 2020-04-01 NOTE — Patient Outreach (Signed)
[name]) Care Management [facility] CM Telephone Outreach,  PCP office completes Transition of Care follow up post-hospital discharge Post-hospital discharge day # [place] x 2   04/01/2020  Kristen [name] Dec 14, 1940 [id number]  Successful (consecutive) second telephone outreach attempt to [facility], 79 y/o female referred to [place] 03/22/20 by [facility] Liaison after patient experienced recenthospitalization November 23-26, 2038for HTN urgency. Patient was previously active with [facility]; she was discharged homt to self-care without home health services.Patient has history including, but not limited to, CAD; HTN/ HLD; CVA; CKD-III; DM-II- with most recent A1-C of 10.7 (03/16/20); patients previous A1-C was7.6 (09/25/19).  HIPAA/ identity verified; [facility] CM services/ program discussed/ reviewed with patient today, who provides ongoing consent for [name] CM involvement in her care.  Patient was recently active with [facility], prior to recent hospital admission.  Patient reports "doing fair" after recent hospital visit; confirms that she visited with PCP 03/30/20; states "I have been real sleepy and just sluggish" since hospital discharge.  She tells me that she has not been monitoring her blood sugars recently, as she has been unable to obtain needles for insulin pen, and doesn't see the point in checking her blood sugars when she can't take her insulin.  Tells me she shared with PCP at time of office visit; reports she went by outpatient pharmacy as advised then, and was told that they could not refill her needle prescription, as it was not time for refill; patient stated that she has not informed PCP of this update.  Requested that patient take her blood sugar while we are on phone together; she does so, blood sugar noted as 256.    2:10 pm:  [facility] CM Telephone Outreach Care Coordination via 3- way call to PCP  office; left voice message on triage line, explaining that patient continues without needles for insulin pen and has not been monitoring her blood sugars at home post- hospital discharge; requested call back to patient to advise; provided my direct number should office team wish to contact me  Patient was recently discharged from hospital and all medications were reviewed with patient today- medication list updated according to patient report today; other than the insulin issue described above, no concerns/ discrepancies noted/ identified  Patient reports occasionally checking blood pressures at home; reports that her PCP believes her monitor may be running high; she is writing down blood pressures when she takes them and verbalizes plans to take monitor to PCP office visit next week.  Advised patient to resume monitoring and recording blood sugars 2-3 times per day; encouraged her to listen out for follow up call from PCP office regarding issue with insulin needles; confirmed that patient has my direct phone number should she wish to talk to me prior to next outreach, scheduled for next week, post- follow up PCP office visit.  Encouraged patient to contact me directly if needs, questions, issues, or concerns arise prior to next scheduled outreach.  3:10 pm: received incoming call from PCP office, "Rena" who confirms that she has been in touch with outpatient pharmacy to facilitate issue with patient's lack of needles for her insulin pen; Rena reports that she will call patient next; encouraged her to contact me further if I can be of assistance.  Plan:  Patient will take medications as prescribed and will attend all scheduled provider appointments  Patient will promptly notify care providers for  any new concerns/ issues/ problems that arise  Patient will resume monitoring and recording blood sugars at home 2-3 times per day post-hospital discharge  I will make patient's PCP aware of [facility] RN CM  involvement in patient's care-- will send barriers letter  [facility] CM outreach to continue with scheduled phone call next week, post-scheduled follow up PCP office visit  Oneta Rack, RN, BSN, [place] Coordinator [facility] Care Management  [phone]

## 2020-04-01 NOTE — Telephone Encounter (Signed)
Almira Bar high risk care mgr for [facility] left v/m that she was talking with pt today; pt has not had insulin since left the hospital and pt is not taking her BS because she does not have insulin to take. Orene Desanctis said that the pt talked with DR G at the 03/30/20 visit about not having the small pen needles and pt said Dr Darnell Level told pt to go to pharmacy and pick up pen needles. Pt said [facility] said she could not break the box and would have to get the box of $100. I called [facility]. And spoke with Elmyra Ricks who said the pen needles have been ready for pick up since 03/05/20. Pen needles are still ready for pickup. I called pt back and told her that they could not break a box of the pen needles but they were at [facility] and ready for pick up for $51.99, pt said she would also need the lantus insulin pens. I called [facility] back and spoke with Tiffany who said the refills Dr Darnell Level sent in for lantus pens are available and pt does not have to get the 90 day supply and can get a 30 day supply; Tiffany had difficulty in giving me a price on the insulin and will ck with pharmacy when they come back from break. I called Almira Bar to let her know that the pen needles have been ready for pick up since 03/05/20 and that I was trying to get the best price for the lantus insulin pens. Orene Desanctis said that pt has insulin in the refrigerator and only needed the pen needles. Orene Desanctis said she called because pt has not been cking her BS since she did not have pen needles to take the insulin. Orene Desanctis said she will ck with pt next wk for update. I called pt back and she said that she did not tell me that she needed the lantus pens and that she only needs the pen needles. I advised pt pen needles were ready for pick up now at $51.99 and pt said OK she would get them picked up and I asked pt to be sure and ck BS and take her lantus as instructed and she said OK. Lattie Haw CMA also said that the pt told her that she needed the lantus pens. ???   I called [facility] back and told Lauralyn Primes that the pt only needs the pen needles and Lauralyn Primes said OK and she would put the lantus pens on hold for later if pt needed them.sending to Dr Darnell Level and at this time when pt picks up her pen needles she should be able to take the lantus insulin.

## 2020-04-02 NOTE — Patient Instructions (Signed)
Goals Addressed            This Visit's Progress   . THN CM: Learn More About My Health       Follow Up Date 04/08/20   - make a list of questions - ask questions - bring a list of my medicines to the visit - speak up when I don't understand    Why is this important?   The best way to learn about your health and care is by talking to the doctor and nurse.  They will answer your questions and give you information in the way that you like best.    Notes:     . THN CM: Lifestyle Change-Hypertension       Follow Up Date 04/08/20   - agree to work together to make changes - ask questions to understand - learn about high blood pressure    Why is this important?    The changes that you are asked to make may be hard to do.   This is especially true when the changes are life-long.   Knowing why it is important to you is the first step.   Working on the change with your family or support person helps you not feel alone.   Reward yourself and family or support person when goals are met. This can be an activity you choose like bowling, hiking, biking, swimming or shooting hoops.     Notes:     . THN CM: Maintain My Quality of Life   On track    Follow Up Date 04/08/20   - discuss my treatment options with the doctor or nurse    Why is this important?   Having a long-term illness can be scary.  It can also be stressful for you and your caregiver.  These steps may help.    Notes:     . THN CM: Make and Keep All Appointments   On track    Follow Up Date 04/08/20   - ask family or friend for a ride - call to cancel if needed - keep a calendar with prescription refill dates - keep a calendar with appointment dates    Why is this important?   Part of staying healthy is seeing the doctor for follow-up care.  If you forget your appointments, there are some things you can do to stay on track.    Notes:   04/01/20: be sure to plan ahead to make sure you are able to get to  your appointments: keep up with upcoming appointments on your calendar    . THN CM: Monitor and Manage My Blood Sugar   On track    Follow Up Date 04/08/20   - check blood sugar at prescribed times - check blood sugar if I feel it is too high or too low - enter blood sugar readings and medication or insulin into daily log - take the blood sugar log to all doctor visits - take the blood sugar meter to all doctor visits    Why is this important?   Checking your blood sugar at home helps to keep it from getting very high or very low.  Writing the results in a diary or log helps the doctor know how to care for you.  Your blood sugar log should have the time, date and the results.  Also, write down the amount of insulin or other medicine that you take.  Other information, like what you ate, exercise  done and how you were feeling, will also be helpful.     Notes:     . COMPLETED: THN CM: Obtain Eye Exam       Follow Up Date [id number]   - schedule appointment with eye doctor    Why is this important?   Eye check-ups are important when you have diabetes.  Vision loss can be prevented.    Notes:   04/01/20: Goal placed on hold based on current patient needs    . COMPLETED: THN CM: Perform Foot Care       Follow Up Date    - check feet daily for cuts, sores or redness - keep feet up while sitting - trim toenails straight across - wash and dry feet carefully every day - wear comfortable, cotton socks - wear comfortable, well-fitting shoes    Why is this important?   Good foot care is very important when you have diabetes.  There are many things you can do to keep your feet healthy and catch a problem early.    Notes:   04/01/20: Goal placed on hold based on current patient needs    . THN CM: Set My Target A1C   On track    Follow Up Date [id number]   - set target A1C    Why is this important?   Your target A1C is decided together by you and your doctor.  It is based on  several things like your age and other health issues.    Notes:   04/01/20: -- ask your doctor what your target/ goal A1-C should be    . THN CM: Track and Manage My Blood Pressure-Hypertension   On track                   Follow Up Date 04/08/20   - check blood pressure daily - write blood pressure results in a log or diary    Why is this important?    You won't feel high blood pressure, but it can still hurt your blood vessels.   High blood pressure can cause heart or kidney problems. It can also cause a stroke.   Making lifestyle changes like losing a little weight or eating less salt will help.   Checking your blood pressure at home and at different times of the day can help to control blood pressure.   If the doctor prescribes medicine remember to take it the way the doctor ordered.   Call the office if you cannot afford the medicine or if there are questions about it.     Notes:   04/01/20: Keep up the good work monitoring your blood pressure at home: take your Blood pressure readings form home to each doctor appointment- this will help your doctor make sure that you are on the right medication and doing everything you should be        Hypertension, Adult Hypertension is another name for high blood pressure. High blood pressure forces your heart to work harder to pump blood. This can cause problems over time. There are two numbers in a blood pressure reading. There is a top number (systolic) over a bottom number (diastolic). It is best to have a blood pressure that is below 120/80. Healthy choices can help lower your blood pressure, or you may need medicine to help lower it. What are the causes? The cause of this condition is not known. Some conditions may be related to high blood pressure. What increases  the risk?  Smoking.  Having type 2 diabetes mellitus, high cholesterol, or both.  Not getting enough exercise or physical activity.  Being  overweight.  Having too much fat, sugar, calories, or salt (sodium) in your diet.  Drinking too much alcohol.  Having long-term (chronic) kidney disease.  Having a family history of high blood pressure.  Age. Risk increases with age.  Race. You may be at higher risk if you are African American.  Gender. Men are at higher risk than women before age 25. After age 20, women are at higher risk than men.  Having obstructive sleep apnea.  Stress. What are the signs or symptoms?  High blood pressure may not cause symptoms. Very high blood pressure (hypertensive crisis) may cause: ? Headache. ? Feelings of worry or nervousness (anxiety). ? Shortness of breath. ? Nosebleed. ? A feeling of being sick to your stomach (nausea). ? Throwing up (vomiting). ? Changes in how you see. ? Very bad chest pain. ? Seizures. How is this treated?  This condition is treated by making healthy lifestyle changes, such as: ? Eating healthy foods. ? Exercising more. ? Drinking less alcohol.  Your health care provider may prescribe medicine if lifestyle changes are not enough to get your blood pressure under control, and if: ? Your top number is above 130. ? Your bottom number is above 80.  Your personal target blood pressure may vary. Follow these instructions at home: Eating and drinking   If told, follow the DASH eating plan. To follow this plan: ? Fill one half of your plate at each meal with fruits and vegetables. ? Fill one fourth of your plate at each meal with whole grains. Whole grains include whole-wheat pasta, brown rice, and whole-grain bread. ? Eat or drink low-fat dairy products, such as skim milk or low-fat yogurt. ? Fill one fourth of your plate at each meal with low-fat (lean) proteins. Low-fat proteins include fish, chicken without skin, eggs, beans, and tofu. ? Avoid fatty meat, cured and processed meat, or chicken with skin. ? Avoid pre-made or processed food.  Eat less  than 1,500 mg of salt each day.  Do not drink alcohol if: ? Your doctor tells you not to drink. ? You are pregnant, may be pregnant, or are planning to become pregnant.  If you drink alcohol: ? Limit how much you use to:  0-1 drink a day for women.  0-2 drinks a day for men. ? Be aware of how much alcohol is in your drink. In the [place] one drink equals one 12 oz bottle of beer (355 mL), one 5 oz glass of wine (148 mL), or one 1 oz glass of hard liquor (44 mL). Lifestyle   Work with your doctor to stay at a healthy weight or to lose weight. Ask your doctor what the best weight is for you.  Get at least 30 minutes of exercise most days of the week. This may include walking, swimming, or biking.  Get at least 30 minutes of exercise that strengthens your muscles (resistance exercise) at least 3 days a week. This may include lifting weights or doing Pilates.  Do not use any products that contain nicotine or tobacco, such as cigarettes, e-cigarettes, and chewing tobacco. If you need help quitting, ask your doctor.  Check your blood pressure at home as told by your doctor.  Keep all follow-up visits as told by your doctor. This is important. Medicines  Take over-the-counter and prescription medicines only as  told by your doctor. Follow directions carefully.  Do not skip doses of blood pressure medicine. The medicine does not work as well if you skip doses. Skipping doses also puts you at risk for problems.  Ask your doctor about side effects or reactions to medicines that you should watch for. Contact a doctor if you:  Think you are having a reaction to the medicine you are taking.  Have headaches that keep coming back (recurring).  Feel dizzy.  Have swelling in your ankles.  Have trouble with your vision. Get help right away if you:  Get a very bad headache.  Start to feel mixed up (confused).  Feel weak or numb.  Feel faint.  Have very bad pain in  your: ? Chest. ? Belly (abdomen).  Throw up more than once.  Have trouble breathing. Summary  Hypertension is another name for high blood pressure.  High blood pressure forces your heart to work harder to pump blood.  For most people, a normal blood pressure is less than 120/80.  Making healthy choices can help lower blood pressure. If your blood pressure does not get lower with healthy choices, you may need to take medicine. This information is not intended to replace advice given to you by your health care provider. Make sure you discuss any questions you have with your health care provider. Document Revised: 12/19/2017 Document Reviewed: 12/19/2017 Elsevier Patient Education  [place].   Hyperglycemia Hyperglycemia is when the sugar (glucose) level in your blood is too high. It may not cause symptoms. If you do have symptoms, they may include warning signs, such as:  Feeling more thirsty than normal.  Hunger.  Feeling tired.  Needing to pee (urinate) more than normal.  Blurry eyesight (vision). You may get other symptoms as it gets worse, such as:  Dry mouth.  Not being hungry (loss of appetite).  Fruity-smelling breath.  Weakness.  Weight gain or loss that is not planned. Weight loss may be fast.  A tingling or numb feeling in your hands or feet.  Headache.  Skin that does not bounce back quickly when it is lightly pinched and released (poor skin turgor).  Pain in your belly (abdomen).  Cuts or bruises that heal slowly. High blood sugar can happen to people who do or do not have diabetes. High blood sugar can happen slowly or quickly, and it can be an emergency. Follow these instructions at home: General instructions  Take over-the-counter and prescription medicines only as told by your doctor.  Do not use products that contain nicotine or tobacco, such as cigarettes and e-cigarettes. If you need help quitting, ask your doctor.  Limit  alcohol intake to no more than 1 drink per day for nonpregnant women and 2 drinks per day for men. One drink equals 12 oz of beer, 5 oz of wine, or 1 oz of hard liquor.  Manage stress. If you need help with this, ask your doctor.  Keep all follow-up visits as told by your doctor. This is important. Eating and drinking   Stay at a healthy weight.  Exercise regularly, as told by your doctor.  Drink enough fluid, especially when you: ? Exercise. ? Get sick. ? Are in hot temperatures.  Eat healthy foods, such as: ? Low-fat (lean) proteins. ? Complex carbs (complex carbohydrates), such as whole wheat bread or brown rice. ? Fresh fruits and vegetables. ? Low-fat dairy products. ? Healthy fats.  Drink enough fluid to keep your pee (urine)  clear or pale yellow. If you have diabetes:   Make sure you know the symptoms of hyperglycemia.  Follow your diabetes management plan, as told by your doctor. Make sure you: ? Take insulin and medicines as told. ? Follow your exercise plan. ? Follow your meal plan. Eat on time. Do not skip meals. ? Check your blood sugar as often as told. Make sure to check before and after exercise. If you exercise longer or in a different way than you normally do, check your blood sugar more often. ? Follow your sick day plan whenever you cannot eat or drink normally. Make this plan ahead of time with your doctor.  Share your diabetes management plan with people in your workplace, school, and household.  Check your urine for ketones when you are ill and as told by your doctor.  Carry a card or wear jewelry that says that you have diabetes. Contact a doctor if:  Your blood sugar level is higher than 240 mg/dL (13.3 mmol/L) for 2 days in a row.  You have problems keeping your blood sugar in your target range.  High blood sugar happens often for you. Get help right away if:  You have trouble breathing.  You have a change in how you think, feel, or act  (mental status).  You feel sick to your stomach (nauseous), and that feeling does not go away.  You cannot stop throwing up (vomiting). These symptoms may be an emergency. Do not wait to see if the symptoms will go away. Get medical help right away. Call your local emergency services (911 in the [place] Do not drive yourself to the hospital. Summary  Hyperglycemia is when the sugar (glucose) level in your blood is too high.  High blood sugar can happen to people who do or do not have diabetes.  Make sure you drink enough fluids, eat healthy foods, and exercise regularly.  Contact your doctor if you have problems keeping your blood sugar in your target range. This information is not intended to replace advice given to you by your health care provider. Make sure you discuss any questions you have with your health care provider. Document Revised: 12/27/2015 Document Reviewed: 12/27/2015 Elsevier Patient Education  [place].

## 2020-04-07 ENCOUNTER — Ambulatory Visit (INDEPENDENT_AMBULATORY_CARE_PROVIDER_SITE_OTHER): Payer: Medicare Other | Admitting: Family Medicine

## 2020-04-07 ENCOUNTER — Telehealth: Payer: Self-pay | Admitting: Family Medicine

## 2020-04-07 ENCOUNTER — Encounter: Payer: Self-pay | Admitting: Family Medicine

## 2020-04-07 ENCOUNTER — Other Ambulatory Visit: Payer: Self-pay

## 2020-04-07 VITALS — BP 136/70 | HR 65 | Temp 97.7°F | Ht 60.5 in | Wt 193.6 lb

## 2020-04-07 DIAGNOSIS — E1169 Type 2 diabetes mellitus with other specified complication: Secondary | ICD-10-CM | POA: Diagnosis not present

## 2020-04-07 DIAGNOSIS — I10 Essential (primary) hypertension: Secondary | ICD-10-CM | POA: Diagnosis not present

## 2020-04-07 DIAGNOSIS — I160 Hypertensive urgency: Secondary | ICD-10-CM

## 2020-04-07 DIAGNOSIS — E785 Hyperlipidemia, unspecified: Secondary | ICD-10-CM

## 2020-04-07 DIAGNOSIS — E1159 Type 2 diabetes mellitus with other circulatory complications: Secondary | ICD-10-CM | POA: Diagnosis not present

## 2020-04-07 DIAGNOSIS — Z1211 Encounter for screening for malignant neoplasm of colon: Secondary | ICD-10-CM | POA: Diagnosis not present

## 2020-04-07 DIAGNOSIS — I700 Atherosclerosis of aorta: Secondary | ICD-10-CM

## 2020-04-07 DIAGNOSIS — Z0000 Encounter for general adult medical examination without abnormal findings: Secondary | ICD-10-CM | POA: Diagnosis not present

## 2020-04-07 DIAGNOSIS — E039 Hypothyroidism, unspecified: Secondary | ICD-10-CM

## 2020-04-07 DIAGNOSIS — E1165 Type 2 diabetes mellitus with hyperglycemia: Secondary | ICD-10-CM

## 2020-04-07 DIAGNOSIS — M1A09X0 Idiopathic chronic gout, multiple sites, without tophus (tophi): Secondary | ICD-10-CM

## 2020-04-07 DIAGNOSIS — N1831 Chronic kidney disease, stage 3a: Secondary | ICD-10-CM

## 2020-04-07 DIAGNOSIS — Z79899 Other long term (current) drug therapy: Secondary | ICD-10-CM

## 2020-04-07 DIAGNOSIS — IMO0002 Reserved for concepts with insufficient information to code with codable children: Secondary | ICD-10-CM

## 2020-04-07 DIAGNOSIS — I2510 Atherosclerotic heart disease of native coronary artery without angina pectoris: Secondary | ICD-10-CM

## 2020-04-07 DIAGNOSIS — Z8673 Personal history of transient ischemic attack (TIA), and cerebral infarction without residual deficits: Secondary | ICD-10-CM

## 2020-04-07 DIAGNOSIS — E559 Vitamin D deficiency, unspecified: Secondary | ICD-10-CM

## 2020-04-07 DIAGNOSIS — I69351 Hemiplegia and hemiparesis following cerebral infarction affecting right dominant side: Secondary | ICD-10-CM

## 2020-04-07 DIAGNOSIS — E118 Type 2 diabetes mellitus with unspecified complications: Secondary | ICD-10-CM

## 2020-04-07 LAB — BASIC METABOLIC PANEL
BUN: 21 mg/dL (ref 6–23)
CO2: 29 mEq/L (ref 19–32)
Calcium: 9.2 mg/dL (ref 8.4–10.5)
Chloride: 100 mEq/L (ref 96–112)
Creatinine, Ser: 1 mg/dL (ref 0.40–1.20)
GFR: 53.9 mL/min — ABNORMAL LOW (ref >60.00)
Glucose, Bld: 255 mg/dL — ABNORMAL HIGH (ref 70–99)
Potassium: 3.9 mEq/L (ref 3.5–5.1)
Sodium: 136 mEq/L (ref 135–145)

## 2020-04-07 NOTE — Telephone Encounter (Signed)
Please fax her application if signed and complete, along with the social security statement to the manufacturer. Thanks!  [name], PharmD Clinical Pharmacist [facility] [phone]

## 2020-04-07 NOTE — Telephone Encounter (Signed)
Patient came into office and dropped off social security form for [employer]. Please advise. Placed in tower inbox.

## 2020-04-07 NOTE — Progress Notes (Signed)
Patient ID: Kristen [name], female    DOB: 07-08-1940, 79 y.o.   MRN: [id number]  This visit was conducted in person.  BP 136/70 (BP Location: Left Arm, Patient Position: Sitting, Cuff Size: Large)   Pulse 65   Temp 97.7 F (36.5 C) (Temporal)   Ht 5' 0.5" (1.537 m)   Wt 193 lb 9 oz (87.8 kg)   SpO2 100%   BMI 37.18 kg/m    CC: CPE Subjective:   HPI: Kristen [name] is a 79 y.o. female presenting on 04/07/2020 for Annual Exam (Prt 2.  Pt brought in home BP monitor to compare.  Reading in office today, 147/76.)   Saw health advisor last week for medicare wellness visit. Note reviewed.   No exam data present  Flowsheet Row Clinical Support from 03/31/2020 in [place] at [facility]  PHQ-2 Total Score 0      Fall Risk  03/31/2020 02/02/2020 12/19/2019 05/22/2019 03/14/2019  Falls in the past year? 0 _0 Comment - - - - tripped on rug  Number falls in past yr: 0 _1 0  Comment - - - - -  Injury with Fall? 0 0 0 0 0  Risk Factor Category  - - - - -  Risk for fall due to : Medication side effect History of fall(s);Impaired balance/gait;Impaired mobility History of fall(s);Impaired balance/gait;Impaired mobility History of fall(s);Impaired balance/gait;Impaired mobility Medication side effect  Risk for fall due to: Comment - - - - -  Follow up Falls evaluation completed;Falls prevention discussed Falls evaluation completed;Falls prevention discussed Falls evaluation completed;Falls prevention discussed Falls evaluation completed;Falls prevention discussed Falls evaluation completed;Falls prevention discussed   See prior note for details. Recent hospitalization for severe hypertensive emergency with HA, dizziness, BP up to 270/84. MRI showed old cerebellar infarcts and chronic hypertensive and ischemic microangiopathy.  Brings in home cuff today - comparable readings with home BP cuff.   Current regimen remains amlodipine 51m daily, hydralazine 552mbid, losartan  1009maily, hctz 12.5mg52mily and toprol XL 25mg5mly.   Significant confusion recently over lantus refill. Out of this for the past month. After much back and forth with pharmacy it turns out she was out of lantus pen needles - these have been refilled. Last visit we also worked on PAP for both januvTongalantus. She has since restarted 4d ago, notes ongoing sugar fluctuations. She dropped of PAP forms today at the front desk for januvTongabasaglar.   Reports compliance with all her medications.   Notes itchy skin despite using dove soap and generic cream. No new shampoos or detergents.   Preventative: S/p colonoscopy "a while ago" with Dr. NiemeBrunetta Generarecords received. Denies blood in stool. No fmhx colon cancer. Requests iFOB - we never received through mail. Well woman - s/p hysterectomy for fibroids, ovaries remain. Discussed GYN cancers.Denies symptoms. Mammogram -Birads1 09/2018 - requests Q2yrs.63yrXAWNL 03/2015  Flu shot - yearly COVID vaccine - discussed, declines  Prevnar 08/2013, pneumovax2016  Zostavax - 03/2014  Shingrix - discussed. Would prefer to get at doctor's office.  Advanced directive: scanned 11/20219 - sons ErnestJaquelyn Bitterric aRandall HissCPOA.Hayden not want prolonged life support if terminal condition. MOST form reviewed and filled out7/2019as well. Seat belt use discussed  Sunscreen use discussed.No changing moles on skin.  Non smoker  Alcohol - rare  Dentist - has dentures Eye exam - yearly  Bowel no constipation Bladder - no  urinary incontinence, no stress incontinence symptoms   Lives alone  4 grown children  Education: 11th grade Occupation: retired, was Scientist, water quality  Activity:no regular walking. Diet: good water, fruits/vegetables seldom.     Relevant past medical, surgical, family and social history reviewed and updated as indicated. Interim medical history since our last visit reviewed. Allergies and medications reviewed and  updated. Outpatient Medications Prior to Visit  Medication Sig Dispense Refill  . amLODipine (NORVASC) 5 MG tablet Take 1 tablet (5 mg total) by mouth daily. 90 tablet 3  . atorvastatin (LIPITOR) 40 MG tablet TAKE 1 TABLET BY MOUTH EVERY DAY 90 tablet 2  . blood glucose meter kit and supplies KIT Dispense based on patient and insurance preference. Use up to four times daily as directed. (FOR ICD-9 250.00, 250.01). 1 each 0  . Cholecalciferol (VITAMIN D) 2000 units CAPS Take 1 capsule by mouth daily.    . clopidogrel (PLAVIX) 75 MG tablet Take 1 tablet (75 mg total) by mouth daily. 90 tablet 3  . hydrALAZINE (APRESOLINE) 50 MG tablet Take 1 tablet (50 mg total) by mouth 2 (two) times daily. 60 tablet 3  . insulin glargine (LANTUS SOLOSTAR) 100 UNIT/ML Solostar Pen Inject 18 Units into the skin at bedtime. 5 mL 1  . Insulin Pen Needle (B-D UF III MINI PEN NEEDLES) 31G X 5 MM MISC INJECT 1 EACH AS DIRECTED AT BEDTIME. USE AS DIRECTED TO INJECT INSULIN. DX CODE: E11.8, E11.65 100 each 0  . losartan-hydrochlorothiazide (HYZAAR) 100-12.5 MG tablet Take 1 tablet by mouth daily. 90 tablet 0  . metoprolol succinate (TOPROL-XL) 25 MG 24 hr tablet Take 1 tablet (25 mg total) by mouth daily. 90 tablet 3  . nitroGLYCERIN (NITROSTAT) 0.4 MG SL tablet Place 0.4 mg under the tongue every 5 (five) minutes as needed for chest pain.    [name] DELICA LANCETS FINE MISC USE U PTO 4 TIMES DAILY AS DIRECTED 100 each 2  . ONETOUCH ULTRA test strip USE UP TO 4 TIMES DAILY AS DIRECTED 100 strip 3  . potassium chloride (KLOR-CON) 10 MEQ tablet Take 1 tablet (10 mEq total) by mouth every Monday, Wednesday, and Friday. 40 tablet 3  . sitaGLIPtin (JANUVIA) 50 MG tablet Take 1 tablet (50 mg total) by mouth daily. 90 tablet 3   No facility-administered medications prior to visit.     Per HPI unless specifically indicated in ROS section below Review of Systems  Constitutional: Negative for activity change, appetite  change, chills, fatigue, fever and unexpected weight change.  HENT: Negative for hearing loss.   Eyes: Negative for visual disturbance.  Respiratory: Negative for cough, chest tightness, shortness of breath and wheezing.   Cardiovascular: Negative for chest pain, palpitations and leg swelling.  Gastrointestinal: Negative for abdominal distention, abdominal pain, blood in stool, constipation, diarrhea, nausea and vomiting.  Genitourinary: Negative for difficulty urinating and hematuria.  Musculoskeletal: Negative for arthralgias, myalgias and neck pain.  Skin: Negative for rash.  Neurological: Negative for dizziness, seizures, syncope and headaches.  Hematological: Negative for adenopathy. Does not bruise/bleed easily.  Psychiatric/Behavioral: Negative for dysphoric mood. The patient is not nervous/anxious.    Objective:  BP 136/70 (BP Location: Left Arm, Patient Position: Sitting, Cuff Size: Large)   Pulse 65   Temp 97.7 F (36.5 C) (Temporal)   Ht 5' 0.5" (1.537 m)   Wt 193 lb 9 oz (87.8 kg)   SpO2 100%   BMI 37.18 kg/m   Wt Readings from Last  3 Encounters:  04/07/20 193 lb 9 oz (87.8 kg)  03/30/20 194 lb (88 kg)  03/19/20 194 lb 3.6 oz (88.1 kg)      Physical Exam Vitals and nursing note reviewed.  Constitutional:      General: She is not in acute distress.    Appearance: Normal appearance. She is well-developed and well-nourished. She is not ill-appearing.  HENT:     Head: Normocephalic and atraumatic.     Right Ear: Hearing, tympanic membrane, ear canal and external ear normal.     Left Ear: Hearing, tympanic membrane, ear canal and external ear normal.     Mouth/Throat:     Mouth: Oropharynx is clear and moist and mucous membranes are normal.     Pharynx: No posterior oropharyngeal edema.  Eyes:     General: No scleral icterus.    Extraocular Movements: Extraocular movements intact and EOM normal.     Conjunctiva/sclera: Conjunctivae normal.     Pupils: Pupils are  equal, round, and reactive to light.  Neck:     Thyroid: No thyroid mass or thyromegaly.     Vascular: No carotid bruit.  Cardiovascular:     Rate and Rhythm: Normal rate and regular rhythm.     Pulses: Normal pulses and intact distal pulses.          Radial pulses are 2+ on the right side and 2+ on the left side.     Heart sounds: Normal heart sounds. No murmur heard.   Pulmonary:     Effort: Pulmonary effort is normal. No respiratory distress.     Breath sounds: Normal breath sounds. No wheezing, rhonchi or rales.  Abdominal:     General: Abdomen is flat. Bowel sounds are normal. There is no distension.     Palpations: Abdomen is soft. There is no mass.     Tenderness: There is no abdominal tenderness. There is no guarding or rebound.     Hernia: No hernia is present.  Musculoskeletal:        General: No edema. Normal range of motion.     Cervical back: Normal range of motion and neck supple.     Right lower leg: No edema.     Left lower leg: No edema.  Lymphadenopathy:     Cervical: No cervical adenopathy.  Skin:    General: Skin is warm and dry.     Capillary Refill: Capillary refill takes less than 2 seconds.     Findings: No rash.  Neurological:     General: No focal deficit present.     Mental Status: She is alert and oriented to person, place, and time.     Comments: CN grossly intact, station and gait intact  Psychiatric:        Mood and Affect: Mood and affect and mood normal.        Behavior: Behavior normal.        Thought Content: Thought content normal.        Judgment: Judgment normal.       Results for orders placed or performed in visit on 17/79/39  Basic metabolic panel  Result Value Ref Range   Sodium 136 135 - 145 mEq/L   Potassium 3.9 3.5 - 5.1 mEq/L   Chloride 100 96 - 112 mEq/L   CO2 29 19 - 32 mEq/L   Glucose, Bld 255 (H) 70 - 99 mg/dL   BUN 21 6 - 23 mg/dL   Creatinine, Ser 1.00 0.40 - 1.20  mg/dL   GFR 53.90 (L) >60.00 mL/min   Calcium 9.2  8.4 - 10.5 mg/dL   Assessment & Plan:  This visit occurred during the SARS-CoV-2 public health emergency.  Safety protocols were in place, including screening questions prior to the visit, additional usage of staff PPE, and extensive cleaning of exam room while observing appropriate contact time as indicated for disinfecting solutions.   Problem List Items Addressed This Visit    Vitamin D deficiency    Continue 2000 IU daily. Levels adequate (29)      Type 2 diabetes mellitus with vascular disease ([place])    H/o CVA.       Relevant Orders   Basic metabolic panel (Completed)   RESOLVED: Thoracic aortic atherosclerosis (HCC)   Severe obesity (BMI 35.0-39.9) with comorbidity ([place])    Encouraged healthy diet and lifestyle choices to affect sustainable weight loss.       Resistant hypertension    BP better controlled on current regimen - continue: amlodipine 47m daily, hydralazine 565mbid, losartan 10055maily, hctz 12.5mg72mily and toprol XL 25mg21mly      Polypharmacy    Ongoing difficulty and easy confusion with medication administration. I asked our pharmacist to touch base with patient.       Hypertensive urgency    This seems to have resolved.       Hyperlipidemia associated with type 2 diabetes mellitus (HCC)    Chronic, well controlled on lipitor 40mg 47my - continue. The 10-year ASCVD risk score (Goff Mikey Bussing., eBrooke Bonitoal., 2013) is: 27%   Values used to calculate the score:     Age: 73 yea68     Sex: Female     Is Non-Hispanic African American: Yes     Diabetic: Yes     Tobacco smoker: No     Systolic Blood Pressure: 136 mm062    Is BP treated: Yes     HDL Cholesterol: 45.7 mg/dL     Total Cholesterol: 137 mg/dL       History of CVA (cerebrovascular accident)    Continue aspirin, plavix       Hemiparesis of right dominant side (HCC)  [place]/p previous CVA      Health maintenance examination - Primary    Preventative protocols reviewed and updated unless pt  declined. Discussed healthy diet and lifestyle.       Gout    No recent gout flares, not on daily urate lowering medication. Urate levels stable.  Has been on colchicine PRN in the past.       Diabetes mellitus type 2, uncontrolled, with complications (HCC)    Marked confusion about lantus refill - it turns out she had run out of lantus pens so was unable to inject lantus for 1+ month. Pens have now been refilled and she restarted lantus 18 u daily 3-4 days ago. Continue.  We have filled out PAP application for januviTongaasaglar for 2022 - she dropped out filld forms up front today.  I have asked [facility] pharmacist to reach out to patient.       Chronic kidney disease, stage 3a (HCC)  Meadeecent acute worsening while hospitalized - update renal function today.       CAD (coronary artery disease)/coronary calcifications    Continue lipitor, plavix.       Borderline hypothyroidism    Chronic, stable off medication.       Aortic atherosclerosis (HCC)  [place]  statin and plavix       Other Visit Diagnoses    Special screening for malignant neoplasms, colon       Relevant Orders   Fecal occult blood, imunochemical       No orders of the defined types were placed in this encounter.  Orders Placed This Encounter  Procedures  . Fecal occult blood, imunochemical    Standing Status:   Future    Standing Expiration Date:   04/08/2021  . Basic metabolic panel    Patient instructions: I'm glad you've restarted your daily insulin shot - this will help keep sugars better controlled. Pass by lab to pick up stool kit.  In June 2022, call to schedule mammogram at your convenience: [facility] [phone] I do recommend you get the COVID shots Therapist, music or [place]). You can get this at local pharmacy.  Consider the new shingles shot - if interested, check with pharmacy about new 2 shot shingles series (shingrix). Return as needed or in 3-4 months for diabetes  follow up visit.  Come 2022 your insulin may change from Lantus to Basaglar brand.   Follow up plan: Return in about 4 months (around 08/06/2020) for follow up visit.  [name], MD

## 2020-04-07 NOTE — Patient Instructions (Addendum)
I'm glad you've restarted your daily insulin shot - this will help keep sugars better controlled. Pass by lab to pick up stool kit.  In June 2022, call to schedule mammogram at your convenience: [place] [phone] I do recommend you get the COVID shots Therapist, music or [place]). You can get this at local pharmacy.  Consider the new shingles shot - if interested, check with pharmacy about new 2 shot shingles series (shingrix).  Return as needed or in 3-4 months for diabetes follow up visit.  Come 2022 your insulin may change from Lantus to Basaglar brand.   Health Maintenance After Age 59 After age 30, you are at a higher risk for certain long-term diseases and infections as well as injuries from falls. Falls are a major cause of broken bones and head injuries in people who are older than age 50. Getting regular preventive care can help to keep you healthy and well. Preventive care includes getting regular testing and making lifestyle changes as recommended by your health care provider. Talk with your health care provider about:  Which screenings and tests you should have. A screening is a test that checks for a disease when you have no symptoms.  A diet and exercise plan that is right for you. What should I know about screenings and tests to prevent falls? Screening and testing are the best ways to find a health problem early. Early diagnosis and treatment give you the best chance of managing medical conditions that are common after age 21. Certain conditions and lifestyle choices may make you more likely to have a fall. Your health care provider may recommend:  Regular vision checks. Poor vision and conditions such as cataracts can make you more likely to have a fall. If you wear glasses, make sure to get your prescription updated if your vision changes.  Medicine review. Work with your health care provider to regularly review all of the medicines you are taking, including  over-the-counter medicines. Ask your health care provider about any side effects that may make you more likely to have a fall. Tell your health care provider if any medicines that you take make you feel dizzy or sleepy.  Osteoporosis screening. Osteoporosis is a condition that causes the bones to get weaker. This can make the bones weak and cause them to break more easily.  Blood pressure screening. Blood pressure changes and medicines to control blood pressure can make you feel dizzy.  Strength and balance checks. Your health care provider may recommend certain tests to check your strength and balance while standing, walking, or changing positions.  Foot health exam. Foot pain and numbness, as well as not wearing proper footwear, can make you more likely to have a fall.  Depression screening. You may be more likely to have a fall if you have a fear of falling, feel emotionally low, or feel unable to do activities that you used to do.  Alcohol use screening. Using too much alcohol can affect your balance and may make you more likely to have a fall. What actions can I take to lower my risk of falls? General instructions  Talk with your health care provider about your risks for falling. Tell your health care provider if: ? You fall. Be sure to tell your health care provider about all falls, even ones that seem minor. ? You feel dizzy, sleepy, or off-balance.  Take over-the-counter and prescription medicines only as told by your health care provider. These include any  supplements.  Eat a healthy diet and maintain a healthy weight. A healthy diet includes low-fat dairy products, low-fat (lean) meats, and fiber from whole grains, beans, and lots of fruits and vegetables. Home safety  Remove any tripping hazards, such as rugs, cords, and clutter.  Install safety equipment such as grab bars in bathrooms and safety rails on stairs.  Keep rooms and walkways well-lit. Activity   Follow a  regular exercise program to stay fit. This will help you maintain your balance. Ask your health care provider what types of exercise are appropriate for you.  If you need a cane or walker, use it as recommended by your health care provider.  Wear supportive shoes that have nonskid soles. Lifestyle  Do not drink alcohol if your health care provider tells you not to drink.  If you drink alcohol, limit how much you have: ? 0-1 drink a day for women. ? 0-2 drinks a day for men.  Be aware of how much alcohol is in your drink. In the [place] one drink equals one typical bottle of beer (12 oz), one-half glass of wine (5 oz), or one shot of hard liquor (1 oz).  Do not use any products that contain nicotine or tobacco, such as cigarettes and e-cigarettes. If you need help quitting, ask your health care provider. Summary  Having a healthy lifestyle and getting preventive care can help to protect your health and wellness after age 33.  Screening and testing are the best way to find a health problem early and help you avoid having a fall. Early diagnosis and treatment give you the best chance for managing medical conditions that are more common for people who are older than age 26.  Falls are a major cause of broken bones and head injuries in people who are older than age 72. Take precautions to prevent a fall at home.  Work with your health care provider to learn what changes you can make to improve your health and wellness and to prevent falls. This information is not intended to replace advice given to you by your health care provider. Make sure you discuss any questions you have with your health care provider. Document Revised: 08/01/2018 Document Reviewed: 02/21/2017 Elsevier Patient Education  2020 [employer].

## 2020-04-07 NOTE — Assessment & Plan Note (Addendum)
Preventative protocols reviewed and updated unless pt declined. Discussed healthy diet and lifestyle.  

## 2020-04-08 ENCOUNTER — Encounter: Payer: Self-pay | Admitting: *Deleted

## 2020-04-08 ENCOUNTER — Other Ambulatory Visit: Payer: Self-pay | Admitting: *Deleted

## 2020-04-08 NOTE — Assessment & Plan Note (Signed)
Continue 2000 IU daily. Levels adequate (29)

## 2020-04-08 NOTE — Assessment & Plan Note (Signed)
Continue statin and plavix

## 2020-04-08 NOTE — Patient Instructions (Signed)
Goals Addressed            This Visit's Progress   . THN CM: Learn More About My Health   On track    Follow Up Date 04/21/20   - make a list of questions - ask questions - bring a list of my medicines to the visit - speak up when I don't understand    Why is this important?   The best way to learn about your health and care is by talking to the doctor and nurse.  They will answer your questions and give you information in the way that you like best.    Notes:   04/08/20: -- Please let your doctor know if you are confused around what you should be doing to take care of yourself and stay healthy- don't wait until problems develop if you have questions or concerns    . THN CM: Lifestyle Change-Hypertension   On track    Follow Up Date 04/21/20   - agree to work together to make changes - ask questions to understand - learn about high blood pressure    Why is this important?    The changes that you are asked to make may be hard to do.   This is especially true when the changes are life-long.   Knowing why it is important to you is the first step.   Working on the change with your family or support person helps you not feel alone.   Reward yourself and family or support person when goals are met. This can be an activity you choose like bowling, hiking, biking, swimming or shooting hoops.     Notes:   04/08/20: -- please resume checking your blood pressure at home: be sure to write down the readings so we can review them next time we talk -- it is a good idea to get a dedicated notebook to write down your blood pressures at home -- take your blood pressures to your doctor appointments so your doctor can review- this will help your doctor know if you are on the right treatment plan and medications -- call your doctor if you are worried about your blood pressure being too high or too low -- continue following a heart healthy and low salt diet    . THN CM: Maintain My  Quality of Life   On track    Follow Up Date 04/21/20   - discuss my treatment options with the doctor or nurse    Why is this important?   Having a long-term illness can be scary.  It can also be stressful for you and your caregiver.  These steps may help.    Notes:   04/08/20: -- Be sure to contact your care team (doctors and nurses) if you have questions or feel confused about what you should be doing to stay healthy and on track    . THN CM: Make and Keep All Appointments   On track    Follow Up Date 04/21/20   - ask family or friend for a ride - call to cancel if needed - keep a calendar with prescription refill dates - keep a calendar with appointment dates    Why is this important?   Part of staying healthy is seeing the doctor for follow-up care.  If you forget your appointments, there are some things you can do to stay on track.    Notes:   04/01/20: be sure to plan ahead to  make sure you are able to get to your appointments: keep up with upcoming appointments on your calendar  04/08/20: -- great job attending all of your doctor appointments; I am glad you have been released to resume driving    . THN CM: Monitor and Manage My Blood Sugar   On track    Follow Up Date 04/21/20   - check blood sugar at prescribed times - check blood sugar if I feel it is too high or too low - enter blood sugar readings and medication or insulin into daily log - take the blood sugar log to all doctor visits - take the blood sugar meter to all doctor visits    Why is this important?   Checking your blood sugar at home helps to keep it from getting very high or very low.  Writing the results in a diary or log helps the doctor know how to care for you.  Your blood sugar log should have the time, date and the results.  Also, write down the amount of insulin or other medicine that you take.  Other information, like what you ate, exercise done and how you were feeling, will also be  helpful.     Notes:   04/08/20: -- I am glad you received printed educational material mailed to you on 04/01/20: please read and review regularly; let me know if you have questions -- check your blood sugars twice a day: fasting (on an empty stomach in the morning when you wake up) and 2-hours after eating: this will help your doctor know if you are on the right medicines for Diabetes -- take your blood sugar list from home to your doctor appointments with you -- we will review your blood sugars at home when I call you on the phone on April 21, 2020    . THN CM: Obtain Eye Exam       Follow Up Date 04/21/2020   - schedule appointment with eye doctor    Why is this important?   Eye check-ups are important when you have diabetes.  Vision loss can be prevented.    Notes:   04/01/20: Goal placed on hold based on current patient needs  04/08/20: -- As we discussed today, [place], please schedule an appointment to get an updated eye and vision exam: Diabetes can lead to eye and vision problems; you should have a yearly eye exam if you are diabetic    . THN CM: Set My Target A1C   On track    Follow Up Date 04/21/2020   - set target A1C    Why is this important?   Your target A1C is decided together by you and your doctor.  It is based on several things like your age and other health issues.    Notes:   04/01/20: -- ask your doctor what your target/ goal A1-C should be  04/08/20: -- [place], it is very important that you check your blood sugars at home so you know how they are running when you have your next A1-C -- Your last A1-C was 10.7 on March 16, 2020; you doctor wants your A1-C to be 7.0 or less; keeping track of your blood sugars will help you stay on track to reduce your A1-C and get it back under control -- Be sure to contact your doctor if your blood sugars start to get too high or too low, or if you think you are having trouble with your blood sugars --  Please  review the information attached about signs/ symptoms of low and high blood sugar; if you experience these signs and symptoms, follow the action plan and let your doctor know    . THN CM: Track and Manage My Blood Pressure-Hypertension   On track                   Follow Up Date 04/21/20   - check blood pressure daily - write blood pressure results in a log or diary    Why is this important?    You won't feel high blood pressure, but it can still hurt your blood vessels.   High blood pressure can cause heart or kidney problems. It can also cause a stroke.   Making lifestyle changes like losing a little weight or eating less salt will help.   Checking your blood pressure at home and at different times of the day can help to control blood pressure.   If the doctor prescribes medicine remember to take it the way the doctor ordered.   Call the office if you cannot afford the medicine or if there are questions about it.     Notes:   04/08/20: -- please resume checking your blood pressure at home: be sure to write down the readings so we can review them next time we talk -- it is a good idea to get a dedicated notebook to write down your blood pressures at home -- take your blood pressures to your doctor appointments so your doctor can review- this will help your doctor know if you are on the right treatment plan and medications -- call your doctor if you are worried about your blood pressure being too high or too low  04/01/20: Keep up the good work monitoring your blood pressure at home: take your Blood pressure readings form home to each doctor appointment- this will help your doctor make sure that you are on the right medication and doing everything you should be       Hyperglycemia Hyperglycemia is when the sugar (glucose) level in your blood is too high. It may not cause symptoms. If you do have symptoms, they may include warning signs, such as:  Feeling more thirsty than  normal.  Hunger.  Feeling tired.  Needing to pee (urinate) more than normal.  Blurry eyesight (vision). You may get other symptoms as it gets worse, such as:  Dry mouth.  Not being hungry (loss of appetite).  Fruity-smelling breath.  Weakness.  Weight gain or loss that is not planned. Weight loss may be fast.  A tingling or numb feeling in your hands or feet.  Headache.  Skin that does not bounce back quickly when it is lightly pinched and released (poor skin turgor).  Pain in your belly (abdomen).  Cuts or bruises that heal slowly. High blood sugar can happen to people who do or do not have diabetes. High blood sugar can happen slowly or quickly, and it can be an emergency. Follow these instructions at home: General instructions  Take over-the-counter and prescription medicines only as told by your doctor.  Do not use products that contain nicotine or tobacco, such as cigarettes and e-cigarettes. If you need help quitting, ask your doctor.  Limit alcohol intake to no more than 1 drink per day for nonpregnant women and 2 drinks per day for men. One drink equals 12 oz of beer, 5 oz of wine, or 1 oz of hard liquor.  Manage stress. If  you need help with this, ask your doctor.  Keep all follow-up visits as told by your doctor. This is important. Eating and drinking   Stay at a healthy weight.  Exercise regularly, as told by your doctor.  Drink enough fluid, especially when you: ? Exercise. ? Get sick. ? Are in hot temperatures.  Eat healthy foods, such as: ? Low-fat (lean) proteins. ? Complex carbs (complex carbohydrates), such as whole wheat bread or brown rice. ? Fresh fruits and vegetables. ? Low-fat dairy products. ? Healthy fats.  Drink enough fluid to keep your pee (urine) clear or pale yellow. If you have diabetes:   Make sure you know the symptoms of hyperglycemia.  Follow your diabetes management plan, as told by your doctor. Make sure  you: ? Take insulin and medicines as told. ? Follow your exercise plan. ? Follow your meal plan. Eat on time. Do not skip meals. ? Check your blood sugar as often as told. Make sure to check before and after exercise. If you exercise longer or in a different way than you normally do, check your blood sugar more often. ? Follow your sick day plan whenever you cannot eat or drink normally. Make this plan ahead of time with your doctor.  Share your diabetes management plan with people in your workplace, school, and household.  Check your urine for ketones when you are ill and as told by your doctor.  Carry a card or wear jewelry that says that you have diabetes. Contact a doctor if:  Your blood sugar level is higher than 240 mg/dL (13.3 mmol/L) for 2 days in a row.  You have problems keeping your blood sugar in your target range.  High blood sugar happens often for you. Get help right away if:  You have trouble breathing.  You have a change in how you think, feel, or act (mental status).  You feel sick to your stomach (nauseous), and that feeling does not go away.  You cannot stop throwing up (vomiting). These symptoms may be an emergency. Do not wait to see if the symptoms will go away. Get medical help right away. Call your local emergency services (911 in the [place] Do not drive yourself to the hospital. Summary  Hyperglycemia is when the sugar (glucose) level in your blood is too high.  High blood sugar can happen to people who do or do not have diabetes.  Make sure you drink enough fluids, eat healthy foods, and exercise regularly.  Contact your doctor if you have problems keeping your blood sugar in your target range. This information is not intended to replace advice given to you by your health care provider. Make sure you discuss any questions you have with your health care provider. Document Revised: 12/27/2015 Document Reviewed: 12/27/2015 Elsevier Patient Education   [place].   Hypoglycemia Hypoglycemia is when the sugar (glucose) level in your blood is too low. Signs of low blood sugar may include:  Feeling: ? Hungry. ? Worried or nervous (anxious). ? Sweaty and clammy. ? Confused. ? Dizzy. ? Sleepy. ? Sick to your stomach (nauseous).  Having: ? A fast heartbeat. ? A headache. ? A change in your vision. ? Tingling or no feeling (numbness) around your mouth, lips, or tongue. ? Jerky movements that you cannot control (seizure).  Having trouble with: ? Moving (coordination). ? Sleeping. ? Passing out (fainting). ? Getting upset easily (irritability). Low blood sugar can happen to people who have diabetes and people who  do not have diabetes. Low blood sugar can happen quickly, and it can be an emergency. Treating low blood sugar Low blood sugar is often treated by eating or drinking something sugary right away, such as:  Fruit juice, 4-6 oz (120-150 mL).  Regular soda (not diet soda), 4-6 oz (120-150 mL).  Low-fat milk, 4 oz (120 mL).  Several pieces of hard candy.  Sugar or honey, 1 Tbsp (15 mL). Treating low blood sugar if you have diabetes If you can think clearly and swallow safely, follow the 15:15 rule:  Take 15 grams of a fast-acting carb (carbohydrate). Talk with your doctor about how much you should take.  Always keep a source of fast-acting carb with you, such as: ? Sugar tablets (glucose pills). Take 3-4 pills. ? 6-8 pieces of hard candy. ? 4-6 oz (120-150 mL) of fruit juice. ? 4-6 oz (120-150 mL) of regular (not diet) soda. ? 1 Tbsp (15 mL) honey or sugar.  Check your blood sugar 15 minutes after you take the carb.  If your blood sugar is still at or below 70 mg/dL (3.9 mmol/L), take 15 grams of a carb again.  If your blood sugar does not go above 70 mg/dL (3.9 mmol/L) after 3 tries, get help right away.  After your blood sugar goes back to normal, eat a meal or a snack within 1 hour.  Treating very  low blood sugar If your blood sugar is at or below 54 mg/dL (3 mmol/L), you have very low blood sugar (severe hypoglycemia). This may also cause:  Passing out.  Jerky movements you cannot control (seizure).  Losing consciousness (coma). This is an emergency. Do not wait to see if the symptoms will go away. Get medical help right away. Call your local emergency services (911 in the [place] Do not drive yourself to the hospital. If you have very low blood sugar and you cannot eat or drink, you may need a glucagon shot (injection). A family member or friend should learn how to check your blood sugar and how to give you a glucagon shot. Ask your doctor if you need to have a glucagon shot kit at home. Follow these instructions at home: General instructions  Take over-the-counter and prescription medicines only as told by your doctor.  Stay aware of your blood sugar as told by your doctor.  Limit alcohol intake to no more than 1 drink a day for nonpregnant women and 2 drinks a day for men. One drink equals 12 oz of beer (355 mL), 5 oz of wine (148 mL), or 1 oz of hard liquor (44 mL).  Keep all follow-up visits as told by your doctor. This is important. If you have diabetes:   Follow your diabetes care plan as told by your doctor. Make sure you: ? Know the signs of low blood sugar. ? Take your medicines as told. ? Follow your exercise and meal plan. ? Eat on time. Do not skip meals. ? Check your blood sugar as often as told by your doctor. Always check it before and after exercise. ? Follow your sick day plan when you cannot eat or drink normally. Make this plan ahead of time with your doctor.  Share your diabetes care plan with: ? Your work or school. ? People you live with.  Check your pee (urine) for ketones: ? When you are sick. ? As told by your doctor.  Carry a card or wear jewelry that says you have diabetes. Contact a doctor  if:  You have trouble keeping your blood sugar in  your target range.  You have low blood sugar often. Get help right away if:  You still have symptoms after you eat or drink something sugary.  Your blood sugar is at or below 54 mg/dL (3 mmol/L).  You have jerky movements that you cannot control.  You pass out. These symptoms may be an emergency. Do not wait to see if the symptoms will go away. Get medical help right away. Call your local emergency services (911 in the [place] Do not drive yourself to the hospital. Summary  Hypoglycemia happens when the level of sugar (glucose) in your blood is too low.  Low blood sugar can happen to people who have diabetes and people who do not have diabetes. Low blood sugar can happen quickly, and it can be an emergency.  Make sure you know the signs of low blood sugar and know how to treat it.  Always keep a source of sugar (fast-acting carb) with you to treat low blood sugar. This information is not intended to replace advice given to you by your health care provider. Make sure you discuss any questions you have with your health care provider. Document Revised: 08/01/2018 Document Reviewed: 05/14/2015 Elsevier Patient Education  2020 [employer].

## 2020-04-08 NOTE — Assessment & Plan Note (Signed)
H/o CVA  °

## 2020-04-08 NOTE — Assessment & Plan Note (Signed)
Ongoing difficulty and easy confusion with medication administration. I asked our pharmacist to touch base with patient.

## 2020-04-08 NOTE — Assessment & Plan Note (Signed)
Continue aspirin, plavix.  

## 2020-04-08 NOTE — Assessment & Plan Note (Addendum)
No recent gout flares, not on daily urate lowering medication. Urate levels stable.  Has been on colchicine PRN in the past.

## 2020-04-08 NOTE — Assessment & Plan Note (Signed)
S/p previous CVA

## 2020-04-08 NOTE — Assessment & Plan Note (Signed)
Recent acute worsening while hospitalized - update renal function today.

## 2020-04-08 NOTE — Assessment & Plan Note (Signed)
Encouraged healthy diet and lifestyle choices to affect sustainable weight loss.  ?

## 2020-04-08 NOTE — Assessment & Plan Note (Addendum)
Chronic, stable off medication.  

## 2020-04-08 NOTE — Assessment & Plan Note (Signed)
Continue lipitor, plavix.  

## 2020-04-08 NOTE — Assessment & Plan Note (Signed)
Marked confusion about lantus refill - it turns out she had run out of lantus pens so was unable to inject lantus for 1+ month. Pens have now been refilled and she restarted lantus 18 u daily 3-4 days ago. Continue.  We have filled out PAP application for [place] and basaglar for 2022 - she dropped out filld forms up front today.  I have asked [facility] pharmacist to reach out to patient.

## 2020-04-08 NOTE — Assessment & Plan Note (Signed)
Chronic, well controlled on lipitor 40mg  daily - continue. The 10-year ASCVD risk score Mikey Bussing DC Brooke Bonito., et al., 2013) is: 27%   Values used to calculate the score:     Age: 79 years     Sex: Female     Is Non-Hispanic African American: Yes     Diabetic: Yes     Tobacco smoker: No     Systolic Blood Pressure: 183 mmHg     Is BP treated: Yes     HDL Cholesterol: 45.7 mg/dL     Total Cholesterol: 137 mg/dL

## 2020-04-08 NOTE — Patient Outreach (Signed)
[facility]) Care Management THN CM Telephone Outreach PCP office completes Transition of Care follow up post-hospital discharge Post-hospital discharge day # 20 without hospital re-admission  04/08/2020  [place] 01-Oct-1940 [id number]  Successfultelephone outreach attemptto [place], 79 y/o female referred to [place] 03/22/20 by [facility] Liaison after patient experienced recenthospitalization November 23-26, 208for HTN urgency. Patient was previously active with [place] [facility]; she was discharged home to self-care without home health services.Patient has history including, but not limited to, CAD; HTN/ HLD; CVA; CKD-III; DM-II- with most recent A1-C of 10.7 (03/16/20); patients previous A1-C was7.6 (09/25/19).  HIPAA/ identity verified; patient tells me she is doing "great" and had "a real good doctor office visit" with PCP yesterday.  Confirms that she has now obtained necessary supplies (needles) to administer insulin; able to accurately verbalize dosing for Lantus insulin and confirms she is taking as prescribed.  She denies clinical/ medication concerns and confirms that [facility] Pharmacist at PCP office is assisting her with patient assistance for several of her medications.  Patient sounds to be in no distress throughout call today and we reviewed previously provided printed educational material mailed to patient around HTN and DM- patient will need ongoing reinforcement around same.  -- took recorded blood sugars and blood sugars from home monitoring with her to PCP office- unfortunately, she did not remember to bring back home with her and can not review either with me today; encouraged her to obtain a dedicated notebook for home monitoring -- reports PCP told her "numbers look good;" for both blood sugar and blood pressures -- has not yet monitored today's blood sugar: requested that she do while we are on phone; 176, 2 hours  post-prandial -- has been cleared to resume driving post- hospital discharge -- has been following heart healthy low sodium diabetic diet  Patient denies further issues, concerns, or problems today.  I confirmed that patient has my direct phone number, the main [facility] CM office phone number, and the [facility] CM 24-hour nurse advice phone number should issues arise prior to next scheduled THN CM outreach.  Encouraged patient to contact me directly if needs, questions, issues, or concerns arise prior to next scheduled outreach; patient agreed to do so.  Plan:  Patient will take medications as prescribed and will attend all scheduled provider appointments  Patient will promptly notify care providers for any new concerns/ issues/ problems that arise  Patient will resume monitoring and recording blood sugars at home 2-3 times per day post-hospital discharge  I will share today's [facility] CM note/ care plan with patient's PCP as [facility] RN CM initial assessment  THN CM outreach to continue with scheduled phone call in 2 weeks, unless indicated sooner  Oneta Rack, RN, BSN, [place] Care Management  [phone]

## 2020-04-08 NOTE — Telephone Encounter (Signed)
Thank you for looking into this and figuring out what the problem was! Pt seen yesterday.

## 2020-04-08 NOTE — Assessment & Plan Note (Signed)
This seems to have resolved 

## 2020-04-08 NOTE — Assessment & Plan Note (Signed)
BP better controlled on current regimen - continue: amlodipine 5mg  daily, hydralazine 50mg  bid, losartan 100mg  daily, hctz 12.5mg  daily and toprol XL 25mg  daily

## 2020-04-09 NOTE — Telephone Encounter (Signed)
Paperwork completed. Copies made of financial information and pts part of paperwork. Contacted pt and advised copies for her paperwork are at the front desk for her to pick up. Faxed all paperwork, made copies and gave to [place], and placed other copy in scan folder.

## 2020-04-20 ENCOUNTER — Ambulatory Visit: Payer: Self-pay | Admitting: *Deleted

## 2020-04-21 ENCOUNTER — Other Ambulatory Visit: Payer: Self-pay | Admitting: *Deleted

## 2020-04-21 ENCOUNTER — Encounter: Payer: Self-pay | Admitting: *Deleted

## 2020-04-21 NOTE — Patient Outreach (Signed)
Kristen [name]) Care Management THN CM Telephone Outreach Post-hospital discharge day # 33 without hospital re-admission [facility] previously involved in patient's care   04/21/2020  Kristen [name] 1940/06/19 [id number]  Successfultelephone outreach attemptto [name], 79 y/o female referred to [place] 03/22/20 by [name] Liaison after patient experienced recenthospitalization November 23-26, 2074for HTN urgency. Patient was previously active with [name]; she was discharged home to self-care without home health services.Patient has history including, but not limited to, CAD; HTN/ HLD; CVA; CKD-III; DM-II- with most recent A1-C of 10.7 (03/16/20); patients previous A1-C was7.6 (09/25/19).  HIPAA/ identity verified; patient tells me she is doing "great" and "is feeling spunky;" she reports no clinical concerns and sounds to be in no distress throughout call today.  Patient reports that she has not been consistently monitoring/ recording blood sugars or blood pressures at home; "too busy with holidays;" discussed need to get back on track and re-inforced value of monitoring/ recording at home.  Previously provided education reiterated with patient who reports she "knows" she should be monitoring/ recording but was "waiting until after holidays" to start.  -- reviewed with patient previously provided education around high/ low blood sugars: she denies having any symptoms of either -- asked patient to monitor her blood sugar now while on phone with me; 219-- ate "about an hour ago' -- confirms that she practiced "portion control" over holidays- positive reinforcement provided; she is very proud of this and should be -- taking all medications as prescribed; denies medication concerns; able to verbalize accurate dosing of insulin -- continues driving self; verbalizes thinking about selling her  home to move into senior apartment facility (independent living) where her friend lives -- has been following heart healthy low sodium diabetic diet  Discussed with patient change in my role and possible transfer back to [name] vs. another [name] RN CM; patient adamant that she does not wish to have another [name] RN CM, and specifically states that she will only work with her previous [name], as she continues to be wary of talking to people she does not know.  Acknowledges patient request, and reiterated with her the importance of monitoring and recording her blood sugars and blood pressures at home so that referral to [name] can be placed.  Made patient aware that my next outreach in 2 weeks will be my last;  Let her know that if she consistently resumes blood sugar and blood pressure monitoring and is not re-hospitalized, I will transfer to same (previous) [name] at time of next outreach- she verbalizes agreement and understanding.  Patient denies further issues, concerns, or problems today. I confirmed that patient hasmy direct phone number, the main THN CM office phone number, and the [name] CM 24-hour nurse advice phone number should issues arise prior to next scheduled THN CM outreach in 2 weeks for review of blood sugars and blood pressures.  Encouraged patient to contact me directly if needs, questions, issues, or concerns arise prior to next scheduled outreach; patient agreed to do so.  [name] CM Telephone Outreach Care Coordination: Contacted [name] and discussed patient case/ plan for eventual transfer back to [place]:  Patient will take medications as prescribed and will attend all scheduled provider appointments  Patient will promptly notify care providers for any  new concerns/ issues/ problems that arise  Patient willresume monitoring and recording blood sugars at home 2-3 times per day  post-hospital discharge  I will share today's [name] CM note/ care plan with patient'sPCP as [name] RN CM initial assessment  THN CM outreach to continue with scheduled phone call in 2 weeks for hopeful transition to [facility] RN Health Coach, unless indicated sooner  [name], RN, BSN, [employer] Coordinator [name] Care Management  [phone]

## 2020-04-22 NOTE — Patient Instructions (Signed)
It was nice talking with you today Kristen [name]!  As we discussed today,  -- check your blood sugars twice a day: fasting (on an empty stomach in the morning when you wake up) and 2-hours after eating and write down on paper what your blood sugar is  -- we will review your blood sugars at home when I call you on the phone on May 05, 2019-- if you are doing well, I will transfer you back to the [place] that you were working with before your recent hospital visit  -- please resume checking your blood pressure at home several times each week: be sure to write down the readings so we can review them next time we talk  -- please schedule an appointment to get an updated eye and vision exam   Goals Addressed            This Visit's Progress   . COMPLETED: THN CM: Learn More About My Health   On track    Follow Up Date 04/21/20   - make a list of questions - ask questions - bring a list of my medicines to the visit - speak up when I don't understand    Why is this important?   The best way to learn about your health and care is by talking to the doctor and nurse.  They will answer your questions and give you information in the way that you like best.    Notes:   04/08/20: -- Please let your doctor know if you are confused around what you should be doing to take care of yourself and stay healthy- don't wait until problems develop if you have questions or concerns    . THN CM: Lifestyle Change-Hypertension   On track    Follow Up Date 05/04/20   - agree to work together to make changes - ask questions to understand - learn about high blood pressure    Why is this important?    The changes that you are asked to make may be hard to do.   This is especially true when the changes are life-long.   Knowing why it is important to you is the first step.   Working on the change with your family or support person helps you not feel alone.   Reward yourself and family or  support person when goals are met. This can be an activity you choose like bowling, hiking, biking, swimming or shooting hoops.     Notes:   04/08/20 and 04/21/20: -- please resume checking your blood pressure at home: be sure to write down the readings so we can review them next time we talk -- it is a good idea to get a dedicated notebook to write down your blood pressures at home -- take your blood pressures to your doctor appointments so your doctor can review- this will help your doctor know if you are on the right treatment plan and medications -- call your doctor if you are worried about your blood pressure being too high or too low -- continue following a heart healthy and low salt diet    . COMPLETED: THN CM: Maintain My Quality of Life   On track    Follow Up Date 04/21/20   - discuss my treatment options with the doctor or nurse    Why is this important?   Having a long-term illness can be scary.  It can also be stressful for you and your caregiver.  These steps may help.    Notes:   04/08/20: -- Be sure to contact your care team (doctors and nurses) if you have questions or feel confused about what you should be doing to stay healthy and on track    . COMPLETED: THN CM: Make and Keep All Appointments   On track    Follow Up Date 04/21/20   - ask family or friend for a ride - call to cancel if needed - keep a calendar with prescription refill dates - keep a calendar with appointment dates    Why is this important?   Part of staying healthy is seeing the doctor for follow-up care.  If you forget your appointments, there are some things you can do to stay on track.    Notes:   04/01/20: be sure to plan ahead to make sure you are able to get to your appointments: keep up with upcoming appointments on your calendar  04/08/20: -- great job attending all of your doctor appointments; I am glad you have been released to resume driving    . THN CM: Monitor and Manage My  Blood Sugar   On track    Follow Up Date 05/04/20   - check blood sugar at prescribed times - check blood sugar if I feel it is too high or too low - enter blood sugar readings and medication or insulin into daily log - take the blood sugar log to all doctor visits - take the blood sugar meter to all doctor visits    Why is this important?   Checking your blood sugar at home helps to keep it from getting very high or very low.  Writing the results in a diary or log helps the doctor know how to care for you.  Your blood sugar log should have the time, date and the results.  Also, write down the amount of insulin or other medicine that you take.  Other information, like what you ate, exercise done and how you were feeling, will also be helpful.     Notes:   04/08/20 and 04/21/20: -- I am glad you received printed educational material mailed to you on 04/01/20: please read and review regularly; let me know if you have questions -- check your blood sugars twice a day: fasting (on an empty stomach in the morning when you wake up) and 2-hours after eating: this will help your doctor know if you are on the right medicines for Diabetes -- take your blood sugar list from home to your doctor appointments with you -- we will review your blood sugars at home when I call you on the phone on May 05, 2019-- if you are doing well, I will transfer you back to the [place] that you were working with before your recent hospital visit     . THN CM: Obtain Eye Exam   On track    Follow Up Date 05/04/20   - schedule appointment with eye doctor    Why is this important?   Eye check-ups are important when you have diabetes.  Vision loss can be prevented.    Notes:   04/01/20: Goal placed on hold based on current patient needs  04/08/20 and 04/21/20: -- As we discussed, Kristen [name], please schedule an appointment to get an updated eye and vision exam: Diabetes can lead to eye and vision  problems; you should have a yearly eye exam if you are diabetic    .  THN CM: Set My Target A1C   On track    Follow Up Date 05/04/20   - set target A1C    Why is this important?   Your target A1C is decided together by you and your doctor.  It is based on several things like your age and other health issues.    Notes:   04/01/20: -- ask your doctor what your target/ goal A1-C should be  04/08/20 and 04/21/20: -- Kristen [name], it is very important that you check your blood sugars at home so you know how they are running when you have your next A1-C -- Your last A1-C was 10.7 on March 16, 2020; you doctor wants your A1-C to be 7.0 or less; keeping track of your blood sugars will help you stay on track to reduce your A1-C and get it back under control -- Be sure to contact your doctor if your blood sugars start to get too high or too low, or if you think you are having trouble with your blood sugars -- Please review the information attached about signs/ symptoms of low and high blood sugar; if you experience these signs and symptoms, follow the action plan and let your doctor know    . THN CM: Track and Manage My Blood Pressure-Hypertension   On track                   Follow Up Date 05/04/20   - check blood pressure daily - write blood pressure results in a log or diary    Why is this important?    You won't feel high blood pressure, but it can still hurt your blood vessels.   High blood pressure can cause heart or kidney problems. It can also cause a stroke.   Making lifestyle changes like losing a little weight or eating less salt will help.   Checking your blood pressure at home and at different times of the day can help to control blood pressure.   If the doctor prescribes medicine remember to take it the way the doctor ordered.   Call the office if you cannot afford the medicine or if there are questions about it.     Notes:   04/08/20 and 04/21/20: -- please resume  checking your blood pressure at home several times each week: be sure to write down the readings so we can review them next time we talk -- it is a good idea to get a dedicated notebook to write down your blood pressures at home -- take your blood pressures to your doctor appointments so your doctor can review- this will help your doctor know if you are on the right treatment plan and medications -- call your doctor if you are worried about your blood pressure being too high or too low  04/01/20: Keep up the good work monitoring your blood pressure at home: take your Blood pressure readings form home to each doctor appointment- this will help your doctor make sure that you are on the right medication and doing everything you should be       Blood Glucose Monitoring, Adult Monitoring your blood sugar (glucose) is an important part of managing your diabetes (diabetes mellitus). Blood glucose monitoring involves checking your blood glucose as often as directed and keeping a record (log) of your results over time. Checking your blood glucose regularly and keeping a blood glucose log can:  Help you and your health care provider adjust your diabetes management plan  as needed, including your medicines or insulin.  Help you understand how food, exercise, illnesses, and medicines affect your blood glucose.  Let you know what your blood glucose is at any time. You can quickly find out if you have low blood glucose (hypoglycemia) or high blood glucose (hyperglycemia). Your health care provider will set individualized treatment goals for you. Your goals will be based on your age, other medical conditions you have, and how you respond to diabetes treatment. Generally, the goal of treatment is to maintain the following blood glucose levels:  Before meals (preprandial): 80-130 mg/dL (4.4-7.2 mmol/L).  After meals (postprandial): below 180 mg/dL (10 mmol/L).  A1c level: less than 7%. Supplies  needed:  Blood glucose meter.  Test strips for your meter. Each meter has its own strips. You must use the strips that came with your meter.  A needle to prick your finger (lancet). Do not use a lancet more than one time.  A device that holds the lancet (lancing device).  A journal or log book to write down your results. How to check your blood glucose  1. Wash your hands with soap and water. 2. Prick the side of your finger (not the tip) with the lancet. Use a different finger each time. 3. Gently rub the finger until a small drop of blood appears. 4. Follow instructions that come with your meter for inserting the test strip, applying blood to the strip, and using your blood glucose meter. 5. Write down your result and any notes. Some meters allow you to use areas of your body other than your finger (alternative sites) to test your blood. The most common alternative sites are:  Forearm.  Thigh.  Palm of the hand. If you think you may have hypoglycemia, or if you have a history of not knowing when your blood glucose is getting low (hypoglycemia unawareness), do not use alternative sites. Use your finger instead. Alternative sites may not be as accurate as the fingers, because blood flow is slower in these areas. This means that the result you get may be delayed, and it may be different from the result that you would get from your finger. Follow these instructions at home: Blood glucose log   Every time you check your blood glucose, write down your result. Also write down any notes about things that may be affecting your blood glucose, such as your diet and exercise for the day. This information can help you and your health care provider: ? Look for patterns in your blood glucose over time. ? Adjust your diabetes management plan as needed.  Check if your meter allows you to download your records to a computer. Most glucose meters store a record of glucose readings in the meter. If  you have type 1 diabetes:  Check your blood glucose 2 or more times a day.  Also check your blood glucose: ? Before every insulin injection. ? Before and after exercise. ? Before meals. ? 2 hours after a meal. ? Occasionally between 2:00 a.m. and 3:00 a.m., as directed. ? Before potentially dangerous tasks, like driving or using heavy machinery. ? At bedtime.  You may need to check your blood glucose more often, up to 6-10 times a day, if you: ? Use an insulin pump. ? Need multiple daily injections (MDI). ? Have diabetes that is not well-controlled. ? Are ill. ? Have a history of severe hypoglycemia. ? Have hypoglycemia unawareness. If you have type 2 diabetes:  If you take insulin  or other diabetes medicines, check your blood glucose 2 or more times a day.  If you are on intensive insulin therapy, check your blood glucose 4 or more times a day. Occasionally, you may also need to check between 2:00 a.m. and 3:00 a.m., as directed.  Also check your blood glucose: ? Before and after exercise. ? Before potentially dangerous tasks, like driving or using heavy machinery.  You may need to check your blood glucose more often if: ? Your medicine is being adjusted. ? Your diabetes is not well-controlled. ? You are ill. General tips  Always keep your supplies with you.  If you have questions or need help, all blood glucose meters have a 24-hour "hotline" phone number that you can call. You may also contact your health care provider.  After you use a few boxes of test strips, adjust (calibrate) your blood glucose meter by following instructions that came with your meter. Contact a health care provider if:  Your blood glucose is at or above 240 mg/dL (13.3 mmol/L) for 2 days in a row.  You have been sick or have had a fever for 2 days or longer, and you are not getting better.  You have any of the following problems for more than 6 hours: ? You cannot eat or drink. ? You have  nausea or vomiting. ? You have diarrhea. Get help right away if:  Your blood glucose is lower than 54 mg/dL (3 mmol/L).  You become confused or you have trouble thinking clearly.  You have difficulty breathing.  You have moderate or large ketone levels in your urine. Summary  Monitoring your blood sugar (glucose) is an important part of managing your diabetes (diabetes mellitus).  Blood glucose monitoring involves checking your blood glucose as often as directed and keeping a record (log) of your results over time.  Your health care provider will set individualized treatment goals for you. Your goals will be based on your age, other medical conditions you have, and how you respond to diabetes treatment.  Every time you check your blood glucose, write down your result. Also write down any notes about things that may be affecting your blood glucose, such as your diet and exercise for the day. This information is not intended to replace advice given to you by your health care provider. Make sure you discuss any questions you have with your health care provider. Document Revised: 02/01/2018 Document Reviewed: 09/20/2015 Elsevier Patient Education  2020 [employer].

## 2020-04-23 ENCOUNTER — Other Ambulatory Visit: Payer: Self-pay | Admitting: Family Medicine

## 2020-04-24 DIAGNOSIS — A048 Other specified bacterial intestinal infections: Secondary | ICD-10-CM

## 2020-04-24 HISTORY — DX: Other specified bacterial intestinal infections: A04.8

## 2020-05-04 ENCOUNTER — Other Ambulatory Visit: Payer: Self-pay | Admitting: *Deleted

## 2020-05-04 ENCOUNTER — Encounter: Payer: Self-pay | Admitting: *Deleted

## 2020-05-04 NOTE — Patient Outreach (Signed)
[facility]) Care Management THN CM Telephone Outreach, Transfer to [place] discharge day # 46 without unplanned hospital re-admission  05/04/2020  Loda Bialas Sinkler Jan 08, 1941 [id number]  Successfultelephone outreach attemptto [name], 80 y/o female referred to [place] 03/22/20 by [facility] Liaison after patient experienced recenthospitalization November 23-26, 2069fr HTN urgency. [facility] [place] she was discharged hometo self-care without home health services.Patient has history including, but not limited to, CAD; HTN/ HLD; CVA; CKD-III; DM-II- with most recent A1-C of 10.7 (03/16/20); patients previous A1-C was7.6 (09/25/19).  HIPAA/ identity verified;patient tells me she is doing "great" and she reports no clinical concerns and sounds to be in no distress throughout call today.  She confirms she has been monitoring and recording her blood sugars and blood pressures at home.  She denies medication concerns.  -- confirmed patient diligently monitored/ recorded blood sugars at home after our last outreach- reviewed with patient today -- reports fasting blood sugars between 107-194 over last 2 weeks; between 107-171 over last one week -- reports post-prandial blood sugar readings between 136- 266 over last 2 weeks; between 136-219 over last one week -- confirmed no recent hypoglycemic episodes -- confirmed patient remains able to verbalize accurate dosing of insulin and is taking as instructed -- confirmed patient feels "back on track" with diet now that holiday season is over- positive reinforcement provided; encouraged patient's ongoing adherence to low sugar/ low carb diet; reinforced previously provided education around same: patient will continue to require ongoing reinforcement -- confirmed accurate understanding of upcoming provider appointments; patient continues to drive self to  provider appointments -- confirmed patient continues monitoring and recording blood pressures at home several times each week, these were reviewed with patient today -- patient reports blood pressures between 138-162/74-66 over last week -- confirmed no changes to medications and patient continues to take all as prescribed/ instructed; confirmed patient has no medication concerns -- Education provided around signs/ symptoms MI/ CVA, along with corresponding action plan for same  Again discussed with patient change in my role and possible [facility] [place]; patient remains adamant that she does not wish to have another [facility], and specifically states that she will only work with her previous [place] as she continues to be wary of talking to people she does not know.  Patient has clearly met her previously established [facility] goals and could benefit from ongoing support/ education from [place] as she requests.  Patient denies further issues, concerns, or problems today. I confirmed that patient hasmy direct phone number, the main [facility] office phone number, and the [facility] 24-hour nurse advice phone number should issues arise prior to outreach from [place].  [facility] Tele[facility] Outreach Care Coordination: Contacted [place] discussed [place] case/ plan for [place].  Plan:  Patient will take medications as prescribed and will attend all scheduled provider appointments  Patient will promptly notify care providers for any new concerns/ issues/ problems that arise  Patient willcontinue monitoring and recording blood sugars at home 2-3 times per day post-hospital discharge  Patient will continue monitoring and recording blood pressures at home 2-3 times per week  I will place [facility] 2022 calendar and "Living Well with Diabetes"  booklet  in mail to patient, and patient will review  Referral placed today for transition back to [place] today's [facility] CM note/ care plan withpatient'sPCP asTHN RN CM initial assessment  It has been a pleasure caring for Darden Dates, RN, BSN, [place] Coordinator [facility] Care Management  [phone]

## 2020-05-04 NOTE — Patient Instructions (Addendum)
Kristen [name], you have made great progress in checking your blood sugars and blood pressures at home-- keep up the great work  I have sent you a calendar book that you can write all of your appointments, blood pressures and blood sugars down- I hope you will use the book- it is a great way to keep all of your health information organized and together in one place.  You can take the book with you to your doctor appointments to show your doctor your blood pressures and blood sugars- I encouraged you to do that!   I have asked the [facility] to resume calling you to continue supporting you in your management of diabetes and high blood pressure.  You will hear from [place] within the next month.  I have enjoyed working with you and wish you all the best!   Goals Addressed            This Visit's Progress   . THN CM: Lifestyle Change-Hypertension   On track    Follow Up Date 06/08/20   - agree to work together to make changes - ask questions to understand - learn about high blood pressure    Why is this important?    The changes that you are asked to make may be hard to do.   This is especially true when the changes are life-long.   Knowing why it is important to you is the first step.   Working on the change with your family or support person helps you not feel alone.   Reward yourself and family or support person when goals are met. This can be an activity you choose like bowling, hiking, biking, swimming or shooting hoops.     Notes:   04/08/20 and 04/21/20: -- please resume checking your blood pressure at home: be sure to write down the readings so we can review them next time we talk -- it is a good idea to get a dedicated notebook to write down your blood pressures at home -- take your blood pressures to your doctor appointments so your doctor can review- this will help your doctor know if you are on the right treatment plan and medications -- call your doctor if you are  worried about your blood pressure being too high or too low -- continue following a heart healthy and low salt diet  05/04/20: -- [place] job keeping track of your blood pressures at home: keep doing this several times per week and be sure to write them down so you can share with your doctor at your next appointment -- the blood pressures we reviewed today were in very good range -- keep following a low salt diet    . THN CM: Monitor and Manage My Blood Sugar   On track    Follow Up Date 06/08/20   - check blood sugar at prescribed times - check blood sugar if I feel it is too high or too low - enter blood sugar readings and medication or insulin into daily log - take the blood sugar log to all doctor visits - take the blood sugar meter to all doctor visits    Why is this important?   Checking your blood sugar at home helps to keep it from getting very high or very low.  Writing the results in a diary or log helps the doctor know how to care for you.  Your blood sugar log should have the time, date and the results.  Also, write down the amount of insulin or other medicine that you take.  Other information, like what you ate, exercise done and how you were feeling, will also be helpful.     Notes:   04/08/20 and 04/21/20: -- I am glad you received printed educational material mailed to you on 04/01/20: please read and review regularly; let me know if you have questions -- check your blood sugars twice a day: fasting (on an empty stomach in the morning when you wake up) and 2-hours after eating: this will help your doctor know if you are on the right medicines for Diabetes -- take your blood sugar list from home to your doctor appointments with you -- we will review your blood sugars at home when I call you on the phone on May 05, 2019-- if you are doing well, I will transfer you back to the [facility] that you were working with before your recent hospital visit  05/04/20: --  Doristine Devoid job resuming taking your blood sugars at home -- the blood sugars we reviewed today look much better- you are making good progress in lowering your blood sugars: keep up the great work!     Margie Billet CM: Obtain Eye Exam   On track    Follow Up Date 06/08/20   - schedule appointment with eye doctor    Why is this important?   Eye check-ups are important when you have diabetes.  Vision loss can be prevented.    Notes:   04/01/20: Goal placed on hold based on current patient needs  04/08/20 and 04/21/20: -- As we discussed today, [place], please schedule an appointment to get an updated eye and vision exam: Diabetes can lead to eye and vision problems; you should have a yearly eye exam if you are diabetic  05/04/20: -- As we discussed today, Inez Catalina, please schedule an appointment to get an updated eye and vision exam: Diabetes can lead to eye and vision problems; you should have a yearly eye exam if you are diabetic    . THN CM: Set My Target A1C   On track    Follow Up Date 06/08/20   - set target A1C    Why is this important?   Your target A1C is decided together by you and your doctor.  It is based on several things like your age and other health issues.    Notes:   04/01/20: -- ask your doctor what your target/ goal A1-C should be  04/08/20 and 04/21/20: -- Inez Catalina, it is very important that you check your blood sugars at home so you know how they are running when you have your next A1-C -- Your last A1-C was 10.7 on March 16, 2020; you doctor wants your A1-C to be 7.0 or less; keeping track of your blood sugars will help you stay on track to reduce your A1-C and get it back under control -- Be sure to contact your doctor if your blood sugars start to get too high or too low, or if you think you are having trouble with your blood sugars -- Please review the information attached about signs/ symptoms of low and high blood sugar; if you experience these signs and symptoms, follow  the action plan and let your doctor know  05/04/20: -- please consider making you next appointment with your primary care doctor, for the spring of 2022- your next A1-C is due in March or early April -- your blood sugars  are showing good improvement at home: you should see a decrease in your A1-C if your blood sugars remain good at home -- with each visit to your primary care doctor, ask him about your A1-C goal and how he feels about your medications and your progress over time in achieving this goal -- most primary care doctor's want their patient's to have an A1-C between 6.0- 7.0, which equals a blood sugar at home between 120 and 150    . THN CM: Track and Manage My Blood Pressure-Hypertension   On track                   Follow Up Date 06/08/20   - check blood pressure 3 times per week - write blood pressure results in a log or diary    Why is this important?    You won't feel high blood pressure, but it can still hurt your blood vessels.   High blood pressure can cause heart or kidney problems. It can also cause a stroke.   Making lifestyle changes like losing a little weight or eating less salt will help.   Checking your blood pressure at home and at different times of the day can help to control blood pressure.   If the doctor prescribes medicine remember to take it the way the doctor ordered.   Call the office if you cannot afford the medicine or if there are questions about it.     Notes:   04/08/20 and 04/21/20: -- please resume checking your blood pressure at home several times each week: be sure to write down the readings so we can review them next time we talk -- it is a good idea to get a dedicated notebook to write down your blood pressures at home -- take your blood pressures to your doctor appointments so your doctor can review- this will help your doctor know if you are on the right treatment plan and medications -- call your doctor if you are worried about your  blood pressure being too high or too low  04/01/20: Keep up the good work monitoring your blood pressure at home: take your Blood pressure readings form home to each doctor appointment- this will help your doctor make sure that you are on the right medication and doing everything you should be 05/04/20: -- [place] [employer] job keeping track of your blood pressures at home: keep doing this several times per week and be sure to write them down so you can share with your doctor at your next appointment -- the blood pressures we reviewed today were in very good range -- keep following a low salt diet

## 2020-05-05 ENCOUNTER — Other Ambulatory Visit: Payer: Self-pay | Admitting: *Deleted

## 2020-05-07 ENCOUNTER — Other Ambulatory Visit (INDEPENDENT_AMBULATORY_CARE_PROVIDER_SITE_OTHER): Payer: Medicare Other

## 2020-05-07 ENCOUNTER — Telehealth: Payer: Self-pay

## 2020-05-07 DIAGNOSIS — R195 Other fecal abnormalities: Secondary | ICD-10-CM

## 2020-05-07 DIAGNOSIS — Z1211 Encounter for screening for malignant neoplasm of colon: Secondary | ICD-10-CM | POA: Diagnosis not present

## 2020-05-07 LAB — FECAL OCCULT BLOOD, IMMUNOCHEMICAL: Fecal Occult Bld: POSITIVE — AB

## 2020-05-07 NOTE — Telephone Encounter (Signed)
Received faxed approval for enrollment in [place] pt assistance program for a 12- month period.

## 2020-05-07 NOTE — Telephone Encounter (Addendum)
Plz notify iFOB returned positive for blood. Therefore recommend GI referral to discuss possible colonoscopy. Referral placed.

## 2020-05-07 NOTE — Addendum Note (Signed)
Addended by: [name] on: 05/07/2020 05:25 PM   Modules accepted: Orders

## 2020-05-07 NOTE — Telephone Encounter (Signed)
[facility] called critical results @ 1500  Positive IFOB

## 2020-05-11 NOTE — Telephone Encounter (Signed)
Spoke with pt relaying results and Dr. G's message.  Pt verbalizes understanding.  

## 2020-05-14 ENCOUNTER — Other Ambulatory Visit: Payer: Self-pay | Admitting: *Deleted

## 2020-05-14 ENCOUNTER — Telehealth: Payer: Self-pay | Admitting: *Deleted

## 2020-05-14 DIAGNOSIS — E1165 Type 2 diabetes mellitus with hyperglycemia: Secondary | ICD-10-CM

## 2020-05-14 MED ORDER — ONETOUCH ULTRASOFT LANCETS MISC: 11 refills | Status: DC

## 2020-05-14 MED ORDER — ONETOUCH VERIO W/DEVICE KIT: PACK | 0 refills | Status: AC

## 2020-05-14 MED ORDER — ONETOUCH VERIO VI STRP: ORAL_STRIP | 11 refills | Status: DC

## 2020-05-14 NOTE — Telephone Encounter (Signed)
Prescriptions for glucose meter and  testing supplies [facility] requested.

## 2020-05-14 NOTE — Telephone Encounter (Signed)
Received voicemail on triage from [place] with [facility] asking for new prescriptions for glucose meter, test strips and lancets.  [facility] uses [facility] [address].  She states patient has not been checking her blood sugars because her meter doesn't work.

## 2020-05-14 NOTE — Patient Outreach (Signed)
[facility]) Care Management  05/14/2020  [place] Vierling Nickle 05/09/1940 [id number]   RN Health Coach received telephone call from patient.  [employer] compliance verified. Per patient her blood sugar machine is not working properly. Per patient it has been over 2 yrs since she had a new one. She has not been monitoring her blood sugars in the last few days.  Plan: RN called PCP office and requested new glucometer, strips and lancets  [facility] [place] store # [place] [facility] [phone]

## 2020-05-14 NOTE — Telephone Encounter (Signed)
Noted  

## 2020-06-01 ENCOUNTER — Encounter: Payer: Self-pay | Admitting: Nurse Practitioner

## 2020-06-08 ENCOUNTER — Other Ambulatory Visit: Payer: Self-pay | Admitting: *Deleted

## 2020-06-08 NOTE — Patient Instructions (Signed)
Goals Addressed            This Visit's Progress   . THN CM: Lifestyle Change-Hypertension   On track    Timeframe:  Long-Range Goal Priority:  Medium Start Date:   16109604                          Expected End Date:    [id number]                  Follow Up Date [id number]   - agree to work together to make changes - ask questions to understand - learn about high blood pressure    Why is this important?    The changes that you are asked to make may be hard to do.   This is especially true when the changes are life-long.   Knowing why it is important to you is the first step.   Working on the change with your family or support person helps you not feel alone.   Reward yourself and family or support person when goals are met. This can be an activity you choose like bowling, hiking, biking, swimming or shooting hoops.     Notes:   04/08/20 and 04/21/20: -- please resume checking your blood pressure at home: be sure to write down the readings so we can review them next time we talk -- it is a good idea to get a dedicated notebook to write down your blood pressures at home -- take your blood pressures to your doctor appointments so your doctor can review- this will help your doctor know if you are on the right treatment plan and medications -- call your doctor if you are worried about your blood pressure being too high or too low -- continue following a heart healthy and low salt diet  05/04/20: -- [place] job keeping track of your blood pressures at home: keep doing this several times per week and be sure to write them down so you can share with your doctor at your next appointment -- the blood pressures we reviewed today were in very good range -- keep following a low salt diet    . COMPLETED: THN CM: Make and Keep All Appointments       Timeframe:  Long-Range Goal Priority:  Medium Start Date:   [id number]                          Expected End Date:    [id number]                   Follow Up Date [id number]   - ask family or friend for a ride - call to cancel if needed - keep a calendar with prescription refill dates - keep a calendar with appointment dates    Why is this important?   Part of staying healthy is seeing the doctor for follow-up care.  If you forget your appointments, there are some things you can do to stay on track.    Notes:   04/01/20: be sure to plan ahead to make sure you are able to get to your appointments: keep up with upcoming appointments on your calendar  04/08/20: -- great job attending all of your doctor appointments; I am glad you have been released to resume driving    . THN CM: Monitor and Manage My Blood Sugar   Not on track  Timeframe:  Long-Range Goal Priority:  High Start Date:     [id number]                        Expected End Date:     [id number]                 Follow Up Date [id number] - check blood sugar at prescribed times - check blood sugar if I feel it is too high or too low - enter blood sugar readings and medication or insulin into daily log - take the blood sugar log to all doctor visits - take the blood sugar meter to all doctor visits    Why is this important?   Checking your blood sugar at home helps to keep it from getting very high or very low.  Writing the results in a diary or log helps the doctor know how to care for you.  Your blood sugar log should have the time, date and the results.  Also, write down the amount of insulin or other medicine that you take.  Other information, like what you ate, exercise done and how you were feeling, will also be helpful.     Notes:   04/08/20 and 04/21/20: -- I am glad you received printed educational material mailed to you on 04/01/20: please read and review regularly; let me know if you have questions -- check your blood sugars twice a day: fasting (on an empty stomach in the morning when you wake up) and 2-hours after eating: this will help your doctor know if you  are on the right medicines for Diabetes -- take your blood sugar list from home to your doctor appointments with you -- we will review your blood sugars at home when I call you on the phone on May 05, 2019-- if you are doing well, I will transfer you back to the [place] that you were working with before your recent hospital visit  05/04/20: -- Doristine Devoid job resuming taking your blood sugars at home -- the blood sugars we reviewed today look much better- you are making good progress in lowering your blood sugars: keep up the great work!     Margie Billet CM: Obtain Eye Exam   Not on track    Timeframe:  Long-Range Goal Priority:  Medium Start Date:  [id number]                           Expected End Date:   [id number]                   Follow Up Date [id number]   - schedule appointment with eye doctor    Why is this important?   Eye check-ups are important when you have diabetes.  Vision loss can be prevented.    Notes:   04/01/20: Goal placed on hold based on current patient needs  04/08/20 and 04/21/20: -- As we discussed today, [place], please schedule an appointment to get an updated eye and vision exam: Diabetes can lead to eye and vision problems; you should have a yearly eye exam if you are diabetic  05/04/20: -- As we discussed today, Inez Catalina, please schedule an appointment to get an updated eye and vision exam: Diabetes can lead to eye and vision problems; you should have a yearly eye exam if you are diabetic    . THN CM: Set My Target A1C  On track    Timeframe:  Long-Range Goal Priority:  High Start Date:      [id number]                       Expected End Date:    [id number]                  Follow Up Date 06/08/20   - set target A1C    Why is this important?   Your target A1C is decided together by you and your doctor.  It is based on several things like your age and other health issues.    Notes:   04/01/20: -- ask your doctor what your target/ goal A1-C should  be  04/08/20 and 04/21/20: -- Inez Catalina, it is very important that you check your blood sugars at home so you know how they are running when you have your next A1-C -- Your last A1-C was 10.7 on March 16, 2020; you doctor wants your A1-C to be 7.0 or less; keeping track of your blood sugars will help you stay on track to reduce your A1-C and get it back under control -- Be sure to contact your doctor if your blood sugars start to get too high or too low, or if you think you are having trouble with your blood sugars -- Please review the information attached about signs/ symptoms of low and high blood sugar; if you experience these signs and symptoms, follow the action plan and let your doctor know  05/04/20: -- please consider making you next appointment with your primary care doctor, for the spring of 2022- your next A1-C is due in March or early April -- your blood sugars are showing good improvement at home: you should see a decrease in your A1-C if your blood sugars remain good at home -- with each visit to your primary care doctor, ask him about your A1-C goal and how he feels about your medications and your progress over time in achieving this goal -- most primary care doctor's want their patient's to have an A1-C between 6.0- 7.0, which equals a blood sugar at home between 120 and 150  06/08/2020 A1C is 10.7    . THN CM: Track and Manage My Blood Pressure-Hypertension   On track       Timeframe:  Long-Range Goal Priority:  High Start Date:      [id number]                  Expected End Date:                                  Follow Up Date [id number]   - check blood pressure 3 times per week - write blood pressure results in a log or diary    Why is this important?    You won't feel high blood pressure, but it can still hurt your blood vessels.   High blood pressure can cause heart or kidney problems. It can also cause a stroke.   Making lifestyle changes like losing a little weight  or eating less salt will help.   Checking your blood pressure at home and at different times of the day can help to control blood pressure.   If the doctor prescribes medicine remember to take it the way the doctor ordered.   Call the office if you cannot afford the  medicine or if there are questions about it.     Notes:   04/08/20 and 04/21/20: -- please resume checking your blood pressure at home several times each week: be sure to write down the readings so we can review them next time we talk -- it is a good idea to get a dedicated notebook to write down your blood pressures at home -- take your blood pressures to your doctor appointments so your doctor can review- this will help your doctor know if you are on the right treatment plan and medications -- call your doctor if you are worried about your blood pressure being too high or too low  04/01/20: Keep up the good work monitoring your blood pressure at home: take your Blood pressure readings form home to each doctor appointment- this will help your doctor make sure that you are on the right medication and doing everything you should be 05/04/20: -- [employer] job keeping track of your blood pressures at home: keep doing this several times per week and be sure to write them down so you can share with your doctor at your next appointment -- the blood pressures we reviewed today were in very good range -- keep following a low salt diet

## 2020-06-08 NOTE — Patient Outreach (Signed)
[facility]) Care Management  [place]  06/08/2020   Kristen [name] Aug 29, 1940 [id number]  RN Health Coach telephone call to patient.  [employer] compliance verified. Per patient she checked her blood sugar last night.  Her blood sugar was 176. Patient last A1C was 10.7. Per patient she did not have her needles and medication so she was unable to received her insulin. Patient had waited before notifying her Dr or anyone. Then patient needed a new meter. Patient was waiting on pharmacy to call and notify her. Patient has waited for over a month.  Patient has not received her COVID vaccine. Patient has agreed to further outreach  calls.  Encounter Medications:  Outpatient Encounter Medications as of 06/08/2020  Medication Sig Note  . amLODipine (NORVASC) 5 MG tablet Take 1 tablet (5 mg total) by mouth daily.   Marland Kitchen atorvastatin (LIPITOR) 40 MG tablet TAKE 1 TABLET BY MOUTH EVERY DAY   . blood glucose meter kit and supplies KIT Dispense based on patient and insurance preference. Use up to four times daily as directed. (FOR ICD-9 250.00, 250.01).   . Blood Glucose Monitoring Suppl (ONETOUCH VERIO) w/Device KIT Use to check blood sugar up to 4 times a day as directed   . Cholecalciferol (VITAMIN D) 2000 units CAPS Take 1 capsule by mouth daily.   . clopidogrel (PLAVIX) 75 MG tablet Take 1 tablet (75 mg total) by mouth daily.   Marland Kitchen glucose blood (ONETOUCH VERIO) test strip USE TO CHECK BLOOD SUGAR UP TO 4 TIMES DAILY AS DIRECTED   . hydrALAZINE (APRESOLINE) 50 MG tablet Take 1 tablet (50 mg total) by mouth 2 (two) times daily.   . insulin glargine (LANTUS SOLOSTAR) 100 UNIT/ML Solostar Pen Inject 18 Units into the skin at bedtime.   . Insulin Pen Needle (B-D UF III MINI PEN NEEDLES) 31G X 5 MM MISC INJECT 1 EACH AS DIRECTED AT BEDTIME. USE AS DIRECTED TO INJECT INSULIN. DX CODE: E11.8, E11.65 04/01/2020: Patient reports that she has no needles and has not been taking since hospital  discharge March 19, 2020: reports she requested outpatient pharmacy to supply, as advised by PCP 03/30/20- patient reports outpatient pharmacy would not provide; care coordination outreach placed to PCP office  . Lancets (ONETOUCH ULTRASOFT) lancets Use to check blood sugar up to 4 times a day as directed   . losartan-hydrochlorothiazide (HYZAAR) 100-12.5 MG tablet Take 1 tablet by mouth daily.   . metoprolol succinate (TOPROL-XL) 25 MG 24 hr tablet Take 1 tablet (25 mg total) by mouth daily.   . nitroGLYCERIN (NITROSTAT) 0.4 MG SL tablet Place 0.4 mg under the tongue every 5 (five) minutes as needed for chest pain. 04/01/2020: Reports has not needed recently   . potassium chloride (KLOR-CON) 10 MEQ tablet Take 1 tablet (10 mEq total) by mouth every Monday, Wednesday, and Friday.   . sitaGLIPtin (JANUVIA) 50 MG tablet Take 1 tablet (50 mg total) by mouth daily.    No facility-administered encounter medications on file as of 06/08/2020.    Functional Status:  In your present state of health, do you have any difficulty performing the following activities: 03/31/2020  Hearing? N  Vision? N  Difficulty concentrating or making decisions? N  Walking or climbing stairs? N  Dressing or bathing? N  Doing errands, shopping? N  Preparing Food and eating ? N  Using the Toilet? N  In the past six months, have you accidently leaked urine? N  Do you have  problems with loss of bowel control? N  Managing your Medications? N  Managing your Finances? N  Housekeeping or managing your Housekeeping? N  Some recent data might be hidden    Fall/Depression Screening: Fall Risk  04/08/2020 03/31/2020 02/02/2020  Falls in the past year? 0 0 1  Comment - - -  Number falls in past yr: 0 0 1  Comment Denies recent falls - -  Injury with Fall? 0 0 0  Comment N/A- no falls reported - -  Risk Factor Category  - - -  Risk for fall due to : Medication side effect Medication side effect History of fall(s);Impaired  balance/gait;Impaired mobility  Risk for fall due to: Comment - - -  Follow up Falls prevention discussed Falls evaluation completed;Falls prevention discussed Falls evaluation completed;Falls prevention discussed   PHQ 2/9 Scores 04/08/2020 03/31/2020 03/14/2019 03/03/2019 09/27/2018 07/29/2018 05/27/2018  PHQ - 2 Score 0 0 0 0 0 0 0  PHQ- 9 Score - 0 0 - - - -    Assessment:  Goals Addressed            This Visit's Progress   . THN CM: Lifestyle Change-Hypertension   On track    Timeframe:  Long-Range Goal Priority:  Medium Start Date:   22633354                          Expected End Date:    [id number]                  Follow Up Date [id number]   - agree to work together to make changes - ask questions to understand - learn about high blood pressure    Why is this important?    The changes that you are asked to make may be hard to do.   This is especially true when the changes are life-long.   Knowing why it is important to you is the first step.   Working on the change with your family or support person helps you not feel alone.   Reward yourself and family or support person when goals are met. This can be an activity you choose like bowling, hiking, biking, swimming or shooting hoops.     Notes:   04/08/20 and 04/21/20: -- please resume checking your blood pressure at home: be sure to write down the readings so we can review them next time we talk -- it is a good idea to get a dedicated notebook to write down your blood pressures at home -- take your blood pressures to your doctor appointments so your doctor can review- this will help your doctor know if you are on the right treatment plan and medications -- call your doctor if you are worried about your blood pressure being too high or too low -- continue following a heart healthy and low salt diet  05/04/20: -- Kristen [name] job keeping track of your blood pressures at home: keep doing this several times per week and be sure to  write them down so you can share with your doctor at your next appointment -- the blood pressures we reviewed today were in very good range -- keep following a low salt diet    . COMPLETED: THN CM: Make and Keep All Appointments       Timeframe:  Long-Range Goal Priority:  Medium Start Date:   [id number]  Expected End Date:    [id number]                  Follow Up Date [id number]   - ask family or friend for a ride - call to cancel if needed - keep a calendar with prescription refill dates - keep a calendar with appointment dates    Why is this important?   Part of staying healthy is seeing the doctor for follow-up care.  If you forget your appointments, there are some things you can do to stay on track.    Notes:   04/01/20: be sure to plan ahead to make sure you are able to get to your appointments: keep up with upcoming appointments on your calendar  04/08/20: -- great job attending all of your doctor appointments; I am glad you have been released to resume driving    . THN CM: Monitor and Manage My Blood Sugar   Not on track    Timeframe:  Long-Range Goal Priority:  High Start Date:     [id number]                        Expected End Date:     [id number]                 Follow Up Date [id number] - check blood sugar at prescribed times - check blood sugar if I feel it is too high or too low - enter blood sugar readings and medication or insulin into daily log - take the blood sugar log to all doctor visits - take the blood sugar meter to all doctor visits    Why is this important?   Checking your blood sugar at home helps to keep it from getting very high or very low.  Writing the results in a diary or log helps the doctor know how to care for you.  Your blood sugar log should have the time, date and the results.  Also, write down the amount of insulin or other medicine that you take.  Other information, like what you ate, exercise done and how you were  feeling, will also be helpful.     Notes:   04/08/20 and 04/21/20: -- I am glad you received printed educational material mailed to you on 04/01/20: please read and review regularly; let me know if you have questions -- check your blood sugars twice a day: fasting (on an empty stomach in the morning when you wake up) and 2-hours after eating: this will help your doctor know if you are on the right medicines for Diabetes -- take your blood sugar list from home to your doctor appointments with you -- we will review your blood sugars at home when I call you on the phone on May 05, 2019-- if you are doing well, I will transfer you back to the [place] that you were working with before your recent hospital visit  05/04/20: -- Doristine Devoid job resuming taking your blood sugars at home -- the blood sugars we reviewed today look much better- you are making good progress in lowering your blood sugars: keep up the great work!     Margie Billet CM: Obtain Eye Exam   Not on track    Timeframe:  Long-Range Goal Priority:  Medium Start Date:  [id number]  Expected End Date:   17510258                   Follow Up Date [id number]   - schedule appointment with eye doctor    Why is this important?   Eye check-ups are important when you have diabetes.  Vision loss can be prevented.    Notes:   04/01/20: Goal placed on hold based on current patient needs  04/08/20 and 04/21/20: -- As we discussed today, Kristen [name], please schedule an appointment to get an updated eye and vision exam: Diabetes can lead to eye and vision problems; you should have a yearly eye exam if you are diabetic  05/04/20: -- As we discussed today, Kristen [name], please schedule an appointment to get an updated eye and vision exam: Diabetes can lead to eye and vision problems; you should have a yearly eye exam if you are diabetic    . THN CM: Set My Target A1C   On track    Timeframe:  Long-Range Goal Priority:   High Start Date:      [id number]                       Expected End Date:    15400867                  Follow Up Date 06/08/20   - set target A1C    Why is this important?   Your target A1C is decided together by you and your doctor.  It is based on several things like your age and other health issues.    Notes:   04/01/20: -- ask your doctor what your target/ goal A1-C should be  04/08/20 and 04/21/20: -- Kristen [name], it is very important that you check your blood sugars at home so you know how they are running when you have your next A1-C -- Your last A1-C was 10.7 on March 16, 2020; you doctor wants your A1-C to be 7.0 or less; keeping track of your blood sugars will help you stay on track to reduce your A1-C and get it back under control -- Be sure to contact your doctor if your blood sugars start to get too high or too low, or if you think you are having trouble with your blood sugars -- Please review the information attached about signs/ symptoms of low and high blood sugar; if you experience these signs and symptoms, follow the action plan and let your doctor know  05/04/20: -- please consider making you next appointment with your primary care doctor, for the spring of 2022- your next A1-C is due in March or early April -- your blood sugars are showing good improvement at home: you should see a decrease in your A1-C if your blood sugars remain good at home -- with each visit to your primary care doctor, ask him about your A1-C goal and how he feels about your medications and your progress over time in achieving this goal -- most primary care doctor's want their patient's to have an A1-C between 6.0- 7.0, which equals a blood sugar at home between 120 and 150  06/08/2020 A1C is 10.7    . THN CM: Track and Manage My Blood Pressure-Hypertension   On track       Timeframe:  Long-Range Goal Priority:  High Start Date:      [id number]  Expected End Date:                                   Follow Up Date [id number]   - check blood pressure 3 times per week - write blood pressure results in a log or diary    Why is this important?    You won't feel high blood pressure, but it can still hurt your blood vessels.   High blood pressure can cause heart or kidney problems. It can also cause a stroke.   Making lifestyle changes like losing a little weight or eating less salt will help.   Checking your blood pressure at home and at different times of the day can help to control blood pressure.   If the doctor prescribes medicine remember to take it the way the doctor ordered.   Call the office if you cannot afford the medicine or if there are questions about it.     Notes:   04/08/20 and 04/21/20: -- please resume checking your blood pressure at home several times each week: be sure to write down the readings so we can review them next time we talk -- it is a good idea to get a dedicated notebook to write down your blood pressures at home -- take your blood pressures to your doctor appointments so your doctor can review- this will help your doctor know if you are on the right treatment plan and medications -- call your doctor if you are worried about your blood pressure being too high or too low  04/01/20: Keep up the good work monitoring your blood pressure at home: take your Blood pressure readings form home to each doctor appointment- this will help your doctor make sure that you are on the right medication and doing everything you should be 05/04/20: -- Kristen [name] job keeping track of your blood pressures at home: keep doing this several times per week and be sure to write them down so you can share with your doctor at your next appointment -- the blood pressures we reviewed today were in very good range -- keep following a low salt diet        Plan:  Follow-up:  Patient agrees to Care Plan and Follow-up. Provided Calendar book Patient will monitor and  document BP and CBG's Patient will follow up scheduling eye exam RN will follow up with further outreach calls. RN sent PCP update assessment   [facility] [phone]

## 2020-06-14 ENCOUNTER — Telehealth: Payer: Self-pay

## 2020-06-14 NOTE — Chronic Care Management (AMB) (Addendum)
Chronic Care Management Pharmacy Assistant   Name: RICHIE VADALA  MRN: [id number] DOB: 1941/04/02  Reason for Encounter: Patient Assistance Application  PCP : [name], MD  Allergies:   Allergies  Allergen Reactions   Peanut-Containing Drug Products Itching   Acetaminophen Other (See Comments)    "Causes me to spit up blood"   Aleve [Naproxen Sodium] Other (See Comments)    Spits up blood   Doxycycline Other (See Comments)    Malaise, GI upset, "felt drunk" and very ill   Metformin And Related Other (See Comments)    Chills, dizziness   Penicillins Rash    Has patient had a PCN reaction causing immediate rash, facial/tongue/throat swelling, SOB or lightheadedness with hypotension: Yes Has patient had a PCN reaction causing severe rash involving mucus membranes or skin necrosis: No Has patient had a PCN reaction that required hospitalization No Has patient had a PCN reaction occurring within the last 10 years: No If all of the above answers are "NO", then may proceed with Cephalosporin use.     Medications: Outpatient Encounter Medications as of 06/14/2020  Medication Sig Note   amLODipine (NORVASC) 5 MG tablet Take 1 tablet (5 mg total) by mouth daily.    atorvastatin (LIPITOR) 40 MG tablet TAKE 1 TABLET BY MOUTH EVERY DAY    blood glucose meter kit and supplies KIT Dispense based on patient and insurance preference. Use up to four times daily as directed. (FOR ICD-9 250.00, 250.01).    Blood Glucose Monitoring Suppl (ONETOUCH VERIO) w/Device KIT Use to check blood sugar up to 4 times a day as directed    Cholecalciferol (VITAMIN D) 2000 units CAPS Take 1 capsule by mouth daily.    clopidogrel (PLAVIX) 75 MG tablet Take 1 tablet (75 mg total) by mouth daily.    glucose blood (ONETOUCH VERIO) test strip USE TO CHECK BLOOD SUGAR UP TO 4 TIMES DAILY AS DIRECTED    hydrALAZINE (APRESOLINE) 50 MG tablet Take 1 tablet (50 mg total) by mouth 2 (two) times daily.     insulin glargine (LANTUS SOLOSTAR) 100 UNIT/ML Solostar Pen Inject 18 Units into the skin at bedtime.    Insulin Pen Needle (B-D UF III MINI PEN NEEDLES) 31G X 5 MM MISC INJECT 1 EACH AS DIRECTED AT BEDTIME. USE AS DIRECTED TO INJECT INSULIN. DX CODE: E11.8, E11.65 04/01/2020: Patient reports that she has no needles and has not been taking since hospital discharge March 19, 2020: reports she requested outpatient pharmacy to supply, as advised by PCP 03/30/20- patient reports outpatient pharmacy would not provide; care coordination outreach placed to PCP office   Lancets (ONETOUCH ULTRASOFT) lancets Use to check blood sugar up to 4 times a day as directed    losartan-hydrochlorothiazide (HYZAAR) 100-12.5 MG tablet Take 1 tablet by mouth daily.    metoprolol succinate (TOPROL-XL) 25 MG 24 hr tablet Take 1 tablet (25 mg total) by mouth daily.    nitroGLYCERIN (NITROSTAT) 0.4 MG SL tablet Place 0.4 mg under the tongue every 5 (five) minutes as needed for chest pain. 04/01/2020: Reports has not needed recently    potassium chloride (KLOR-CON) 10 MEQ tablet Take 1 tablet (10 mEq total) by mouth every Monday, Wednesday, and Friday.    sitaGLIPtin (JANUVIA) 50 MG tablet Take 1 tablet (50 mg total) by mouth daily.    No facility-administered encounter medications on file as of 06/14/2020.    Current Diagnosis: Patient Active Problem List   Diagnosis Date Noted  Chronic kidney disease, stage 3a ([place]) 03/18/2020   Hypertensive urgency 03/16/2020   Hip osteoarthritis 03/11/2019   Pain due to onychomycosis of toenails of both feet 02/10/2019   Type 2 diabetes mellitus with vascular disease ([place]) 02/10/2019   Polypharmacy 02/04/2018   Urinary urgency 10/29/2017   Seizure ([place]) 09/11/2017   Aphasia    Borderline hypothyroidism 03/12/2017   Disturbance of skin sensation 09/21/2016   Hemiparesis of right dominant side ([place]) 09/02/2016   Intertrigo 08/16/2016   Vitamin D deficiency 08/08/2016   Gout  03/24/2016   Syncope 02/29/2016   Concussion 02/29/2016   Arthritis of left wrist 02/07/2016   History of CVA (cerebrovascular accident) 11/03/2015   Aortic atherosclerosis ([place])    Anemia 06/18/2015   Sensorineural hearing loss (SNHL) of both ears 03/29/2015   Pain of right middle finger 12/22/2014   Memory deficit 12/04/2014   Primary osteoarthritis of left knee 01/27/2014   Advanced care planning/counseling discussion 12/11/2013   Medicare annual wellness visit, subsequent 12/11/2013   Health maintenance examination 12/11/2013   CAD (coronary artery disease)/coronary calcifications    Chest pain 07/17/2013   Benign paroxysmal positional vertigo 05/26/2013   Hyperlipidemia associated with type 2 diabetes mellitus (HCC)    Severe obesity (BMI 35.0-39.9) with comorbidity ([place])    History of pulmonary embolism    Osteoarthritis    Diabetes mellitus type 2, uncontrolled, with complications ([place])    History of DVT (deep vein thrombosis) 12/21/2010   Resistant hypertension 05/13/2009     Contacted patient to get update on whether she has been approved for patient assistance for both [name]. Patient states she is not taking Basaglar, but is using Lantus SoloStar, injecting 18 units at bedtime. She reports the Januvia has been approved and getting for free through manufacturer. Per [name], pt was to start Basaglar to replace Lantus once approved. Will contact program for Basaglar status. Attempted to make follow up telephone appointment with [name] on Thursday 06/17/20 but patient states she is not sure if she will feel up to it as she is going to the doctor Wednesday due to having blood in her stool. She asked that I call her Friday 06/18/20 to schedule follow up with [facility].    Follow-Up:  Pharmacist Review  [name], CPP notified  Margaretmary Dys, [place] Assistant [phone]  I have reviewed the care management and care  coordination activities outlined in this encounter and I am certifying that I agree with the content of this note. No further action required.  [name], PharmD Clinical Pharmacist [facility] [phone]

## 2020-06-16 ENCOUNTER — Encounter: Payer: Self-pay | Admitting: Nurse Practitioner

## 2020-06-16 ENCOUNTER — Ambulatory Visit: Payer: Medicare Other | Admitting: Nurse Practitioner

## 2020-06-16 VITALS — BP 130/70 | HR 51 | Ht 61.0 in | Wt 192.0 lb

## 2020-06-16 DIAGNOSIS — R1310 Dysphagia, unspecified: Secondary | ICD-10-CM

## 2020-06-16 DIAGNOSIS — R195 Other fecal abnormalities: Secondary | ICD-10-CM | POA: Diagnosis not present

## 2020-06-16 MED ORDER — PLENVU 140 G PO SOLR: ORAL | 0 refills | Status: DC

## 2020-06-16 NOTE — Patient Instructions (Signed)
If you are age 79 or older, your body mass index should be between 23-30. Your Body mass index is 36.28 kg/m. If this is out of the aforementioned range listed, please consider follow up with your Primary Care Provider.  If you are age 42 or younger, your body mass index should be between 19-25. Your Body mass index is 36.28 kg/m. If this is out of the aformentioned range listed, please consider follow up with your Primary Care Provider.   PROCEDURES:  You have been scheduled for an endoscopy and colonoscopy. Please follow the written instructions given to you at your visit today. Please take the printed prescription and coupon we gave you to the pharmacy over the next couple days. If you use inhalers (even only as needed), please bring them with you on the day of your procedure.  You will be contacted by our office prior to your procedure for directions on holding your PLavix.  If you do not hear from our office 1 week prior to your scheduled procedure, please call [phone] to discuss.   It was great seeing you today! Thank you for entrusting me with your care and choosing [facility].  Noralyn Pick, CRNP

## 2020-06-16 NOTE — Progress Notes (Signed)
06/16/2020 Anala Whisenant [place] [id number] [birth year]   CHIEF COMPLAINT: blood in stool   HISTORY OF PRESENT ILLNESS: History of hypertension, heart disease, MI 2012, CVA x 2 with most recent occurred 08/2016 on Plavix, seizure 07/2017, DM II, LLE DVT/PE,  sleep apnea "resolved after weight loss", gout, CKD stage III and GERD.  She presents to our office today as referred by Dr. [name] for further evaluation regarding a positive FOBT and to consider a possible colonoscopy. She denies having any upper or lower abdominal pain.  She typically passes a normal formed brown bowel movement daily.  She denies seeing any obvious bright red rectal bleeding.  No melena.  She has infrequent constipation and takes a stool softener as needed.  She reported completing a colonoscopy more than 10 years ago which she reported was normal.  No known family history of colon polyps or colorectal cancer.  She complains of having dysphagia for the past 6 months which occurs if she eats foods such as beef, bread or a biscuit.  The food gets stuck to her upper esophagus and passes after she drinks water.  She denies coughing or expelling the stuck food out.  She has infrequent heartburn for which she takes a small amount of baking soda with relief.  She reported having a throat surgery in the past, further details are unclear.  I am not able to locate any throat or esophageal type surgeries in epic or care everywhere.  She remains quite independent and lives alone in her house.  He has generalized arthritis aches and pains.  No NSAID use. No chest pain or palpitations.  No cough or shortness of breath.  She is on Plavix secondary to having a lower extremity DVT and PE.  No other complaints today.   CBC Latest Ref Rng & Units 03/30/2020 03/19/2020 03/17/2020  WBC 4.0 - 10.5 K/uL 9.0 7.1 10.7(H)  Hemoglobin 12.0 - 15.0 g/dL 12.2 11.4(L) 12.0  Hematocrit 36.0 - 46.0 % 38.4 36.3 37.7  Platelets 150.0 - 400.0 K/uL 237.0 214 231    CMP Latest Ref Rng & Units 04/07/2020 03/30/2020 03/19/2020  Glucose 70 - 99 mg/dL 255(H) 444(H) 108(H)  BUN 6 - 23 mg/dL 21 22 33(H)  Creatinine 0.40 - 1.20 mg/dL 1.00 1.29(H) 1.33(H)  Sodium 135 - 145 mEq/L 136 135 138  Potassium 3.5 - 5.1 mEq/L 3.9 4.4 3.4(L)  Chloride 96 - 112 mEq/L 100 96 101  CO2 19 - 32 mEq/L 29 32 25  Calcium 8.4 - 10.5 mg/dL 9.2 9.8 9.0  Total Protein 6.0 - 8.3 g/dL - 7.1 -  Total Bilirubin 0.2 - 1.2 mg/dL - 1.0 -  Alkaline Phos 39 - 117 U/L - 117 -  AST 0 - 37 U/L - 15 -  ALT 0 - 35 U/L - 21 -   ECHO 03/16/2020: 1. Left ventricular ejection fraction, by estimation, is 65 to 70%. The left ventricle has normal function. The left ventricle has no regional wall motion abnormalities. There is mild concentric left ventricular hypertrophy. Left ventricular diastolic parameters are consistent with Grade I diastolic dysfunction (impaired relaxation). Elevated left atrial pressure. 2. Right ventricular systolic function is normal. The right ventricular size is normal. 3. The mitral valve is normal in structure. No evidence of mitral valve regurgitation. No evidence of mitral stenosis. 4. The aortic valve is tricuspid. Aortic valve regurgitation is not visualized. No aortic stenosis is present. 5. The inferior vena cava is normal in size with greater  than 50% respiratory variability, suggesting right atrial pressure of 3 mmHg. Comparison(s):   Carotid Doppler 08/13/2017: Less than 50% stenosis in the right and left internal carotid arteries.   Past Medical History:  Diagnosis Date  . Abdominal aortic atherosclerosis ([place]) by CT 02/2014  . Acute kidney injury ([place]) 02/29/2016   During hospitalization 07/2017  . CAD (coronary artery disease)    by CT, per pt h/o MI  . Diabetes type 2, uncontrolled ([place])   . Frequent headaches   . GERD (gastroesophageal reflux disease)   . Gout   . History of pulmonary embolism 2012  . HLD (hyperlipidemia)   . HTN  (hypertension)   . 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 Morbid obesity ([place])   . Myocardial infarction [facility]) 2012   per pt. report, states she was treated with medicine, here at [facility]    . Osteoarthritis    knees  . Primary localized osteoarthritis of left knee 09/22/2014  . Risk for falls 09/28/2017  . Sleep apnea 2011   study done in [place], states that since she lost weight she doesn't use the CPAP any longer & she doesn't have a problem with sleep apnea  . Stroke [facility])    still has balance problem on occas. , uses cane but that's mainly for the left knee pain  . Syncope 03/01/2016  . Thoracic aortic atherosclerosis ([place]) 12/2015   by CXR  . Vertigo    hx. benign postitional postural   Past Surgical History:  Procedure Laterality Date  . CATARACT EXTRACTION Bilateral 2013  . EYE SURGERY     /w IOL  . FOOT SURGERY Right   . PARTIAL HYSTERECTOMY     for fibroids, ovaries remain  . TONSILLECTOMY    . TOTAL KNEE ARTHROPLASTY Right 1990s  . TOTAL KNEE ARTHROPLASTY Left 09/22/2014   [name], MD  . TUBAL LIGATION     Social History: Nonsmoker. No alcohol. No drug use.   Family History: Mother deceased in he 47's history of bone cancer. Father deceased history of diabetes.  Sister deceased secondary to diabetes and COPD. 12 half brothers. No family history of esophageal, gastric or colon cancer.   Allergies  Allergen Reactions  . Peanut-Containing Drug Products Itching  . Acetaminophen Other (See Comments)    "Causes me to spit up blood"  . Aleve [Naproxen Sodium] Other (See Comments)    Spits up blood  . Doxycycline Other (See Comments)    Malaise, GI upset, "felt drunk" and very ill  . Metformin And Related Other (See Comments)    Chills, dizziness  . Penicillins Rash    Has patient had a PCN reaction causing immediate rash, facial/tongue/throat swelling, SOB or lightheadedness  with hypotension: Yes Has patient had a PCN reaction causing severe rash involving mucus membranes or skin necrosis: No Has patient had a PCN reaction that required hospitalization No Has patient had a PCN reaction occurring within the last 10 years: No If all of the above answers are "NO", then may proceed with Cephalosporin use.       Outpatient Encounter Medications as of 06/16/2020  Medication Sig  . amLODipine (NORVASC) 5 MG tablet Take 1 tablet (5 mg total) by mouth daily.  Marland Kitchen atorvastatin (LIPITOR) 40 MG tablet TAKE 1 TABLET BY MOUTH EVERY DAY  . blood glucose meter kit and supplies KIT Dispense based on patient and insurance  preference. Use up to four times daily as directed. (FOR ICD-9 250.00, 250.01).  . Blood Glucose Monitoring Suppl (ONETOUCH VERIO) w/Device KIT Use to check blood sugar up to 4 times a day as directed  . Cholecalciferol (VITAMIN D) 2000 units CAPS Take 1 capsule by mouth daily.  . clopidogrel (PLAVIX) 75 MG tablet Take 1 tablet (75 mg total) by mouth daily.  Marland Kitchen glucose blood (ONETOUCH VERIO) test strip USE TO CHECK BLOOD SUGAR UP TO 4 TIMES DAILY AS DIRECTED  . hydrALAZINE (APRESOLINE) 50 MG tablet Take 1 tablet (50 mg total) by mouth 2 (two) times daily.  . insulin glargine (LANTUS SOLOSTAR) 100 UNIT/ML Solostar Pen Inject 18 Units into the skin at bedtime.  . Insulin Pen Needle (B-D UF III MINI PEN NEEDLES) 31G X 5 MM MISC INJECT 1 EACH AS DIRECTED AT BEDTIME. USE AS DIRECTED TO INJECT INSULIN. DX CODE: E11.8, E11.65  . Lancets (ONETOUCH ULTRASOFT) lancets Use to check blood sugar up to 4 times a day as directed  . losartan-hydrochlorothiazide (HYZAAR) 100-12.5 MG tablet Take 1 tablet by mouth daily.  . metoprolol succinate (TOPROL-XL) 25 MG 24 hr tablet Take 1 tablet (25 mg total) by mouth daily.  . nitroGLYCERIN (NITROSTAT) 0.4 MG SL tablet Place 0.4 mg under the tongue every 5 (five) minutes as needed for chest pain.  . potassium chloride (KLOR-CON) 10 MEQ  tablet Take 1 tablet (10 mEq total) by mouth every Monday, Wednesday, and Friday.  . sitaGLIPtin (JANUVIA) 50 MG tablet Take 1 tablet (50 mg total) by mouth daily.   No facility-administered encounter medications on file as of 06/16/2020.   REVIEW OF SYSTEMS:  Gen: Denies fever, sweats or chills. No weight loss.  CV: Occasional edema. Denies chest pain, palpitations.  Resp: Denies cough, shortness of breath of hemoptysis.  GI: See HPI. GU : Denies urinary burning, blood in urine, increased urinary frequency or incontinence. MS: + Back pain, arthritis.  Derm: Denies rash, itchiness, skin lesions or unhealing ulcers. Psych: Denies depression, anxiety, memory loss, suicidal ideation and confusion. Heme: Denies bruising, bleeding. Neuro:  + Headaches. No dizziness or paresthesias. Endo:  Denies any problems with DM, thyroid or adrenal function.  PHYSICAL EXAM: BP 130/70   Pulse (!) 51   Ht _0  (1.549 m)   Wt 192 lb (87.1 kg)   BMI 36.28 kg/m  General: Well developed ... in no acute distress. Head: Normocephalic and atraumatic. Eyes:  Sclerae non-icteric, conjunctive pink. Ears: Normal auditory acuity. Mouth: Dentition intact. No ulcers or lesions.  Neck: Supple, no lymphadenopathy or thyromegaly.  Lungs: Clear bilaterally to auscultation without wheezes, crackles or rhonchi. Heart: Regular rate and rhythm. No murmur, rub or gallop appreciated.  Abdomen: Soft, nontender, non distended. No masses. No hepatosplenomegaly. Normoactive bowel sounds x 4 quadrants.  Rectal: Deferred.  Musculoskeletal: Symmetrical with no gross deformities. Skin: Warm and dry. No rash or lesions on visible extremities. Extremities: No edema. Neurological: Alert oriented x 4, no focal deficits.  Psychological:  Alert and cooperative. Normal mood and affect.  ASSESSMENT AND PLAN:  13.  80 year old female with a positive FOBT presents to schedule a colonoscopy. -Colonoscopy benefits and risks discussed  including risk with sedation, risk of bleeding, perforation and infection. I discussed with the patient at age 80 with multiple co-morbidities she is at higher risk for these complications. She wishes to proceed with a colonoscopy to assess for colorectal cancer, if she was found to have a colorectal cancer she wants to know  and she would pursue treatment if necessary.  -Stool softener as needed   2. Dysphagia, intermittent x 6 months occurs when eating foods such as bread or beef. -EGD with possible esophageal dilatation, benefits and risks discussed including risk with sedation, risk of bleeding, perforation and infection   3. History of DVT/PE on Plavix   4. CAD, MI 2012. ECHO 11/23/202 LV with LF EF 65 - 70%.  5. History of CVA x 2, most recent occurred 08/2016. She remains on Plavix. -Our office will contact Dr. [name] to verify Plavix instructions prior to proceeding with EGD and colonoscopy   6. DM II on insulin             CC:  [name], MD

## 2020-06-17 ENCOUNTER — Other Ambulatory Visit: Payer: Self-pay | Admitting: *Deleted

## 2020-06-17 NOTE — Patient Outreach (Signed)
[facility]) Care Management  06/17/2020  Kristen [name] [birth year] [id number]  Referral for medication assistance from Johny Shock, RN sent to [facility].  [name] [facility] Management Assistant [phone]

## 2020-06-17 NOTE — Progress Notes (Signed)
Agree with assessment and plan as outlined.  Sounds like she is on plavix for history of CVA, will need approval to hold for 5 days prior to EGD and colonoscopy.

## 2020-06-18 NOTE — Chronic Care Management (AMB) (Addendum)
Contacted Ms. Jenny to schedule follow up with [name], Pharm D. Telephone appointment was made fro 06/25/20 at 11:00 am. Patient is aware to have all medications together and any blood pressure  and blood sugar readings to review with Sharyn Lull. Patient verbalized understanding.   Follow-Up:  Pharmacist Review and Scheduled Follow-Up With Clinical Pharmacist  [name], CPP notified  Margaretmary Dys, [place] Pharmacy Assistant [phone]

## 2020-06-21 ENCOUNTER — Telehealth: Payer: Self-pay

## 2020-06-21 ENCOUNTER — Other Ambulatory Visit: Payer: Self-pay | Admitting: *Deleted

## 2020-06-21 NOTE — Patient Outreach (Signed)
[facility]) Care Management  06/21/2020  Kristen [name] [birth year] [id number]   RN Health Coach returned telephone call to patient.  [employer] compliance verified. Patient was wanting to be scheduled for COVID vaccine. RN assisted patient to get initial COVID vaccine at [facility] outpatient pharmacy tomorrow at 7 am.  [place] Management [phone]

## 2020-06-21 NOTE — Telephone Encounter (Signed)
Ria Comment, please check with manufacturer for update on Basaglar PAP.

## 2020-06-21 NOTE — Telephone Encounter (Signed)
Request for surgical clearance:     Endoscopy Procedure  What type of surgery is being performed?     Colonoscopy  When is this surgery scheduled?     08/16/20  What type of clearance is required ?   Pharmacy  Are there any medications that need to be held prior to surgery and how long? 5 day hold Plavix  Practice name and name of physician performing surgery?      [facility]  What is your office phone and fax number?      Phone- [phone]  [phone]  Anesthesia type (None, local, MAC, general) ?       MAC

## 2020-06-21 NOTE — Chronic Care Management (AMB) (Addendum)
Per [employer], Basaglar patient approved 04/19/20 - 04/19/21.  Follow-Up:  Patient Assistance Coordination and Pharmacist Review  [name], CPP notified  Margaretmary Dys, [place] Pharmacy Assistant [phone]

## 2020-06-21 NOTE — Progress Notes (Signed)
Kristen [name], refer to office visit and orders 06/16/2020. Please contact PCP Dr. [name] to verify Plavix instructions prior to procedure. Dr. Havery Moros would like to hold Plavix for 5 days prior to her procedure. Please let me know when when instructions from Dr. [name] received. Thanks.

## 2020-06-22 ENCOUNTER — Other Ambulatory Visit (HOSPITAL_COMMUNITY): Payer: Self-pay | Admitting: Internal Medicine

## 2020-06-22 ENCOUNTER — Ambulatory Visit: Payer: Medicare Other | Attending: Internal Medicine

## 2020-06-22 DIAGNOSIS — Z23 Encounter for immunization: Secondary | ICD-10-CM

## 2020-06-22 NOTE — Progress Notes (Signed)
   Covid-19 Vaccination Clinic  Name:  Kristen [name]    MRN: [id number] DOB: 06/26/1940  06/22/2020  [name] was observed post Covid-19 immunization for 30 minutes based on pre-vaccination screening without incident. She was provided with Vaccine Information Sheet and instruction to access the V-Safe system.   [name] was instructed to call 911 with any severe reactions post vaccine: Marland Kitchen Difficulty breathing  . Swelling of face and throat  . A fast heartbeat  . A bad rash all over body  . Dizziness and weakness   Immunizations Administered    Name Date Dose VIS Date Route   PFIZER Comrnaty(Gray TOP) Covid-19 Vaccine 06/22/2020 10:09 AM 0.3 mL 04/01/2020 Intramuscular   Manufacturer: [employer], [employer]   Lot: [id number]   NDC: [phone]

## 2020-06-24 ENCOUNTER — Telehealth: Payer: Self-pay

## 2020-06-24 NOTE — Chronic Care Management (AMB) (Addendum)
Chronic Care Management Pharmacy Assistant   Name: Kristen [name]  MRN: [id number] DOB: December 29, 1940  Reason for Encounter: Medication Coordination Concerns  PCP : [name], MD  Allergies:   Allergies  Allergen Reactions   Bee Venom Anaphylaxis   Peanut-Containing Drug Products Itching   Acetaminophen Other (See Comments)    "Causes me to spit up blood"   Aleve [Naproxen Sodium] Other (See Comments)    Spits up blood   Doxycycline Other (See Comments)    Malaise, GI upset, "felt drunk" and very ill   Metformin And Related Other (See Comments)    Chills, dizziness   Penicillins Rash    Has patient had a PCN reaction causing immediate rash, facial/tongue/throat swelling, SOB or lightheadedness with hypotension: Yes Has patient had a PCN reaction causing severe rash involving mucus membranes or skin necrosis: No Has patient had a PCN reaction that required hospitalization No Has patient had a PCN reaction occurring within the last 10 years: No If all of the above answers are "NO", then may proceed with Cephalosporin use.     Medications: Outpatient Encounter Medications as of 06/24/2020  Medication Sig Note   amLODipine (NORVASC) 5 MG tablet Take 1 tablet (5 mg total) by mouth daily.    atorvastatin (LIPITOR) 40 MG tablet TAKE 1 TABLET BY MOUTH EVERY DAY    blood glucose meter kit and supplies KIT Dispense based on patient and insurance preference. Use up to four times daily as directed. (FOR ICD-9 250.00, 250.01).    Blood Glucose Monitoring Suppl (ONETOUCH VERIO) w/Device KIT Use to check blood sugar up to 4 times a day as directed    Cholecalciferol (VITAMIN D) 2000 units CAPS Take 1 capsule by mouth daily.    clopidogrel (PLAVIX) 75 MG tablet Take 1 tablet (75 mg total) by mouth daily.    glucose blood (ONETOUCH VERIO) test strip USE TO CHECK BLOOD SUGAR UP TO 4 TIMES DAILY AS DIRECTED    hydrALAZINE (APRESOLINE) 50 MG tablet Take 1 tablet (50 mg total) by mouth 2  (two) times daily.    insulin glargine (LANTUS SOLOSTAR) 100 UNIT/ML Solostar Pen Inject 18 Units into the skin at bedtime.    Insulin Pen Needle (B-D UF III MINI PEN NEEDLES) 31G X 5 MM MISC INJECT 1 EACH AS DIRECTED AT BEDTIME. USE AS DIRECTED TO INJECT INSULIN. DX CODE: E11.8, E11.65 04/01/2020: Patient reports that she has no needles and has not been taking since hospital discharge March 19, 2020: reports she requested outpatient pharmacy to supply, as advised by PCP 03/30/20- patient reports outpatient pharmacy would not provide; care coordination outreach placed to PCP office   Lancets (ONETOUCH ULTRASOFT) lancets Use to check blood sugar up to 4 times a day as directed    losartan-hydrochlorothiazide (HYZAAR) 100-12.5 MG tablet Take 1 tablet by mouth daily.    metoprolol succinate (TOPROL-XL) 25 MG 24 hr tablet Take 1 tablet (25 mg total) by mouth daily.    nitroGLYCERIN (NITROSTAT) 0.4 MG SL tablet Place 0.4 mg under the tongue every 5 (five) minutes as needed for chest pain. 04/01/2020: Reports has not needed recently    PEG-KCl-NaCl-NaSulf-Na Asc-C (PLENVU) 140 g SOLR Use as directed for colonoscopy prep.    potassium chloride (KLOR-CON) 10 MEQ tablet Take 1 tablet (10 mEq total) by mouth every Monday, Wednesday, and Friday.    sitaGLIPtin (JANUVIA) 50 MG tablet Take 1 tablet (50 mg total) by mouth daily.    No facility-administered encounter  medications on file as of 06/24/2020.    Current Diagnosis: Patient Active Problem List   Diagnosis Date Noted   Chronic kidney disease, stage 3a ([place]) 03/18/2020   Hypertensive urgency 03/16/2020   Hip osteoarthritis 03/11/2019   Pain due to onychomycosis of toenails of both feet 02/10/2019   Type 2 diabetes mellitus with vascular disease ([place]) 02/10/2019   Polypharmacy 02/04/2018   Urinary urgency 10/29/2017   Seizure ([place]) 09/11/2017   Aphasia    Borderline hypothyroidism 03/12/2017   Disturbance of skin sensation 09/21/2016    Hemiparesis of right dominant side ([place]) 09/02/2016   Intertrigo 08/16/2016   Vitamin D deficiency 08/08/2016   Gout 03/24/2016   Syncope 02/29/2016   Concussion 02/29/2016   Arthritis of left wrist 02/07/2016   History of CVA (cerebrovascular accident) 11/03/2015   Aortic atherosclerosis ([place])    Anemia 06/18/2015   Sensorineural hearing loss (SNHL) of both ears 03/29/2015   Pain of right middle finger 12/22/2014   Memory deficit 12/04/2014   Primary osteoarthritis of left knee 01/27/2014   Advanced care planning/counseling discussion 12/11/2013   Medicare annual wellness visit, subsequent 12/11/2013   Health maintenance examination 12/11/2013   CAD (coronary artery disease)/coronary calcifications    Chest pain 07/17/2013   Benign paroxysmal positional vertigo 05/26/2013   Hyperlipidemia associated with type 2 diabetes mellitus (HCC)    Severe obesity (BMI 35.0-39.9) with comorbidity ([place])    History of pulmonary embolism    Osteoarthritis    Diabetes mellitus type 2, uncontrolled, with complications ([place])    History of DVT (deep vein thrombosis) 12/21/2010   Resistant hypertension 05/13/2009   Per PCP consult, contacted patient to discuss benefits of med sync, adherence packaging, and delivery. Patient agreed this would be beneficial to her. Agreed to transfer [facility] medications from [facility] to [facility] for coordination and pharmacist oversight. Discussed remaining amount of medications with patient - she is currently out of several medications.  Explained there would not be a price difference in her medications and they would be delivered to her. Reminded patient of follow up appointment with [name] tomorrow for CCM management.  Are you having any problems with your medications? Pharmacy not calling when medications are ready.   Any concerns she would like to discuss with the pharmacist? Discuss medication coordination   Patient reminded to have all medications, supplements and  any blood sugar and blood pressure readings available for review with [name], Pharm. D, at their telephone visit on 06/25/20 at 11:00.   Follow-Up:  Coordination of Enhanced Pharmacy Services and Pharmacist Review  [name], CPP notified  Margaretmary Dys, [place] [phone]  Total time spent for month: 67

## 2020-06-25 ENCOUNTER — Ambulatory Visit (INDEPENDENT_AMBULATORY_CARE_PROVIDER_SITE_OTHER): Payer: Medicare Other

## 2020-06-25 ENCOUNTER — Telehealth: Payer: Self-pay

## 2020-06-25 ENCOUNTER — Other Ambulatory Visit: Payer: Self-pay

## 2020-06-25 DIAGNOSIS — E785 Hyperlipidemia, unspecified: Secondary | ICD-10-CM | POA: Diagnosis not present

## 2020-06-25 DIAGNOSIS — E1169 Type 2 diabetes mellitus with other specified complication: Secondary | ICD-10-CM | POA: Diagnosis not present

## 2020-06-25 DIAGNOSIS — E118 Type 2 diabetes mellitus with unspecified complications: Secondary | ICD-10-CM | POA: Diagnosis not present

## 2020-06-25 DIAGNOSIS — I10 Essential (primary) hypertension: Secondary | ICD-10-CM

## 2020-06-25 DIAGNOSIS — E1165 Type 2 diabetes mellitus with hyperglycemia: Secondary | ICD-10-CM | POA: Diagnosis not present

## 2020-06-25 DIAGNOSIS — IMO0002 Reserved for concepts with insufficient information to code with codable children: Secondary | ICD-10-CM

## 2020-06-25 MED ORDER — HYDRALAZINE HCL 50 MG PO TABS: 50.0000 mg | ORAL_TABLET | Freq: Two times a day (BID) | ORAL | 3 refills | Status: DC

## 2020-06-25 MED ORDER — ATORVASTATIN CALCIUM 40 MG PO TABS
40.0000 mg | ORAL_TABLET | Freq: Every day | ORAL | 3 refills | Status: DC
Start: 2020-06-25 — End: 2021-03-01

## 2020-06-25 MED ORDER — LOSARTAN POTASSIUM-HCTZ 100-12.5 MG PO TABS
1.0000 | ORAL_TABLET | Freq: Every day | ORAL | 3 refills | Status: DC
Start: 2020-06-25 — End: 2021-01-05

## 2020-06-25 MED ORDER — METOPROLOL SUCCINATE ER 25 MG PO TB24
25.0000 mg | ORAL_TABLET | Freq: Every day | ORAL | 3 refills | Status: DC
Start: 2020-06-25 — End: 2020-06-29

## 2020-06-25 NOTE — Progress Notes (Signed)
Encounter details: CCM Time Spent       Value Time User   Time spent with patient (minutes)  110 06/25/2020 12:35 PM [name], [facility]   Time spent performing Chart review  30 06/25/2020 12:35 PM [name], [facility]   Total time (minutes)  140 06/25/2020 12:35 PM [name], RPH      Moderate to High Complex Decision Making       Value Time User   Moderate to High complex decision making  Yes 06/25/2020 12:35 PM [name], [facility]      CCM Services: This encounter meets complex CCM services and moderate to high decision making.  I have personally reviewed this encounter including the documentation in this note and have collaborated with the care management provider regarding care management and care coordination activities to include development and update of the comprehensive care plan. I am certifying that I agree with the content of this note and encounter as supervising physician.

## 2020-06-25 NOTE — Progress Notes (Signed)
Chronic Care Management Pharmacy Note  06/25/2020 Name:  Kristen [name] MRN:  [id number] DOB:  July 08, 1940  Subjective: Kristen [name] is an 80 y.o. year old female who is a primary patient of [name], MD.  The CCM team was consulted for assistance with disease management and care coordination needs.    Engaged with patient by telephone for follow up visit in response to provider referral for pharmacy case management and/or care coordination services.   Consent to Services:  The patient was given information about Chronic Care Management services, agreed to services, and gave verbal consent prior to initiation of services.  Please see initial visit note for detailed documentation.   Patient Care Team: [name], MD as PCP - General (Family Medicine) Rockey Situ Kathlene November, MD as Consulting Physician (Cardiology) [name], [facility] as Pharmacist (Pharmacist) Pleasant, Eppie Gibson, RN as [place] Management  Recent office visits: 04/07/20 - PCP -  Recent hospitalization for severe hypertensive emergency with HA, dizziness, BP up to 270/84. MRI showed old cerebellar infarcts and chronic hypertensive and ischemic microangiopathy.  Brings in home cuff today - comparable readings with home BP cuff. Better controlled on amlodipine 60m daily, hydralazine 582mbid, losartan 10051maily, hctz 12.5mg58mily and toprol XL 25mg75mly 03/30/20 - PCP - HTN urgency hospital f/u  Recent consult visits: 06/16/20 - [facility] ED - 03/15/20 - HTN urgency  Objective:  Lab Results  Component Value Date   CREATININE 1.00 04/07/2020   BUN 21 04/07/2020   GFR 53.90 (L) 04/07/2020   GFRNONAA 41 (L) 03/19/2020   GFRAA >60 02/20/2018   NA 136 04/07/2020   K 3.9 04/07/2020   CALCIUM 9.2 04/07/2020   CO2 29 04/07/2020    Lab Results  Component Value Date/Time   HGBA1C 10.7 (H) 03/16/2020 05:37 AM   HGBA1C 7.6 (A) 09/25/2019 10:07 AM   HGBA1C 8.2 (H)  03/24/2019 09:57 AM   FRUCTOSAMINE 497 (H) 03/30/2020 10:36 AM   GFR 53.90 (L) 04/07/2020 11:08 AM   GFR 39.72 (L) 03/30/2020 10:36 AM   MICROALBUR 7.8 (H) 08/09/2016 10:28 AM   MICROALBUR 2.6 (H) 12/08/2013 10:31 AM    Last diabetic Eye exam:  Lab Results  Component Value Date/Time   HMDIABEYEEXA No Retinopathy 10/15/2018 12:00 AM    Last diabetic Foot exam: No results found for: HMDIABFOOTEX   Lab Results  Component Value Date   CHOL 137 03/30/2020   HDL 45.70 03/30/2020   LDLCALC 69 03/30/2020   LDLDIRECT 102.0 12/31/2015   TRIG 108.0 03/30/2020   CHOLHDL 3 03/30/2020    Hepatic Function Latest Ref Rng & Units 03/30/2020 03/15/2020 09/25/2019  Total Protein 6.0 - 8.3 g/dL 7.1 6.7 -  Albumin 3.5 - 5.2 g/dL 4.0 3.3(L) 3.7  AST 0 - 37 U/L 15 16 -  ALT 0 - 35 U/L 21 17 -  Alk Phosphatase 39 - 117 U/L 117 88 -  Total Bilirubin 0.2 - 1.2 mg/dL 1.0 0.8 -  Bilirubin, Direct 0.1 - 0.5 mg/dL - - -    Lab Results  Component Value Date/Time   TSH 3.49 03/30/2020 10:36 AM   TSH 4.84 (H) 09/25/2019 10:41 AM   FREET4 0.98 03/30/2020 10:36 AM   FREET4 0.93 09/25/2019 10:41 AM    CBC Latest Ref Rng & Units 03/30/2020 03/19/2020 03/17/2020  WBC 4.0 - 10.5 K/uL 9.0 7.1 10.7(H)  Hemoglobin 12.0 - 15.0 g/dL 12.2 11.4(L) 12.0  Hematocrit 36.0 -  46.0 % 38.4 36.3 37.7  Platelets 150.0 - 400.0 K/uL 237.0 214 231   Lab Results  Component Value Date/Time   VD25OH 29.48 (L) 03/30/2020 10:36 AM   VD25OH 32.24 03/24/2019 09:57 AM   Clinical ASCVD: Yes  The 10-year ASCVD risk score Mikey Bussing DC Jr., et al., 2013) is: 25.7%   Values used to calculate the score:     Age: 28 years     Sex: Female     Is Non-Hispanic African American: Yes     Diabetic: Yes     Tobacco smoker: No     Systolic Blood Pressure: 836 mmHg     Is BP treated: Yes     HDL Cholesterol: 45.7 mg/dL     Total Cholesterol: 137 mg/dL    Depression screen [facility] 2/9 04/08/2020 03/31/2020 03/14/2019  Decreased Interest 0 0 0   Down, Depressed, Hopeless 0 0 0  PHQ - 2 Score 0 0 0  Altered sleeping - 0 0  Tired, decreased energy - 0 0  Change in appetite - 0 0  Feeling bad or failure about yourself  - 0 0  Trouble concentrating - 0 0  Moving slowly or fidgety/restless - 0 0  Suicidal thoughts - 0 0  PHQ-9 Score - 0 0  Difficult doing work/chores - Not difficult at all Not difficult at all  Some recent data might be hidden    Social History   Tobacco Use  Smoking Status Never Smoker  Smokeless Tobacco Never Used  Tobacco Comment   tobacco use- no    BP Readings from Last 3 Encounters:  06/16/20 130/70  04/07/20 136/70  03/30/20 140/78   Pulse Readings from Last 3 Encounters:  06/16/20 (!) 51  04/07/20 65  03/30/20 62   Wt Readings from Last 3 Encounters:  06/16/20 192 lb (87.1 kg)  04/07/20 193 lb 9 oz (87.8 kg)  03/30/20 194 lb (88 kg)    Assessment/Interventions: Review of patient past medical history, allergies, medications, health status, including review of consultants reports, laboratory and other test data, was performed as part of comprehensive evaluation and provision of chronic care management services.   SDOH:  (Social Determinants of Health) assessments and interventions performed: Yes SDOH Interventions   Flowsheet Row Most Recent Value  SDOH Interventions   Financial Strain Interventions Other (Comment)  [Basaglar and Januvia through PAP]      CCM Care Plan  Allergies  Allergen Reactions  . Bee Venom Anaphylaxis  . Peanut-Containing Drug Products Itching  . Acetaminophen Other (See Comments)    "Causes me to spit up blood"  . Aleve [Naproxen Sodium] Other (See Comments)    Spits up blood  . Doxycycline Other (See Comments)    Malaise, GI upset, "felt drunk" and very ill  . Metformin And Related Other (See Comments)    Chills, dizziness  . Penicillins Rash    Has patient had a PCN reaction causing immediate rash, facial/tongue/throat swelling, SOB or lightheadedness  with hypotension: Yes Has patient had a PCN reaction causing severe rash involving mucus membranes or skin necrosis: No Has patient had a PCN reaction that required hospitalization No Has patient had a PCN reaction occurring within the last 10 years: No If all of the above answers are "NO", then may proceed with Cephalosporin use.     Medications Reviewed Today    Reviewed by [name], [facility] (Pharmacist) on 06/25/20 at 1212  Med List Status: <None>  Medication Order Taking? Sig Documenting  Provider Last Dose Status Informant  amLODipine (NORVASC) 5 MG tablet [id number] Yes Take 1 tablet (5 mg total) by mouth daily. [name], MD Taking Active   atorvastatin (LIPITOR) 40 MG tablet [id number] Yes TAKE 1 TABLET BY MOUTH EVERY DAY [name], MD Taking Active   blood glucose meter kit and supplies KIT [id number] Yes Dispense based on patient and insurance preference. Use up to four times daily as directed. (FOR ICD-9 250.00, 250.01). Elby Beck, FNP Taking Active Self  Blood Glucose Monitoring [facility] VERIO) w/Device Drucie Opitz [id number] Yes Use to check blood sugar up to 4 times a day as directed [name], MD Taking Active   Cholecalciferol (VITAMIN D) 2000 units CAPS [id number] Yes Take 1 capsule by mouth daily. [provider] Taking Active Self           Med Note Gentry Roch   Wed Feb 20, 2018 11:50 AM)    clopidogrel (PLAVIX) 75 MG tablet [id number] Yes Take 1 tablet (75 mg total) by mouth daily. [name], MD Taking Active Self  glucose blood [facility] VERIO) test strip [id number] Yes USE TO CHECK BLOOD SUGAR UP TO 4 TIMES DAILY AS DIRECTED [name], MD Taking Active   hydrALAZINE (APRESOLINE) 50 MG tablet [id number] Yes Take 1 tablet (50 mg total) by mouth 2 (two) times daily. Dwyane Dee, MD Taking Active   Insulin Glargine [facility]) 100 UNIT/ML [id number] Yes Inject 18 Units into the skin daily. Gets through PAP  [provider] Taking Active Self  Insulin Pen Needle (B-D UF III MINI PEN NEEDLES) 31G X 5 MM MISC [id number] Yes INJECT 1 EACH AS DIRECTED AT BEDTIME. USE AS DIRECTED TO INJECT INSULIN. DX CODE: E11.8, E11.65 [name], MD Taking Active            Med Note Andree Elk, [facility]   Fri Jun 25, 2020 12:10 PM)    Lancets [name] ULTRASOFT) lancets [id number] Yes Use to check blood sugar up to 4 times a day as directed [name], MD Taking Active   losartan-hydrochlorothiazide [facility]) 100-12.5 MG tablet [id number] Yes Take 1 tablet by mouth daily. [name], MD Taking Active Self  metoprolol succinate (TOPROL-XL) 25 MG 24 hr tablet [id number] Yes Take 1 tablet (25 mg total) by mouth daily. [name], MD Taking Active   nitroGLYCERIN (NITROSTAT) 0.4 MG SL tablet [id number]  Place 0.4 mg under the tongue every 5 (five) minutes as needed for chest pain. [provider]  Active Self           Med Note Knox Royalty   Thu Apr 01, 2020  2:39 PM) Reports has not needed recently   PEG-KCl-NaCl-NaSulf-Na Asc-C (PLENVU) 140 g SOLR [id number] No Use as directed for colonoscopy prep.  Patient not taking: Reported on 06/25/2020   Noralyn Pick, NP Not Taking Active   potassium chloride (KLOR-CON) 10 MEQ tablet [id number] Yes Take 1 tablet (10 mEq total) by mouth every Monday, Wednesday, and Friday. [name], MD Taking Active   sitaGLIPtin (JANUVIA) 50 MG tablet [id number] Yes Take 1 tablet (50 mg total) by mouth daily.  Patient taking differently: Take 50 mg by mouth daily. Gets through PAP   [name], MD Taking Active Self  Med List Note [name] 03/16/20 0488): [phone]  [phone]          Patient Active Problem List   Diagnosis Date Noted  . Chronic kidney disease, stage 3a ([place]) 03/18/2020  . Hypertensive urgency  03/16/2020  . Hip osteoarthritis 03/11/2019  . Pain due to onychomycosis of toenails of  both feet 02/10/2019  . Type 2 diabetes mellitus with vascular disease ([place]) 02/10/2019  . Polypharmacy 02/04/2018  . Urinary urgency 10/29/2017  . Seizure ([place]) 09/11/2017  . Aphasia   . Borderline hypothyroidism 03/12/2017  . Disturbance of skin sensation 09/21/2016  . Hemiparesis of right dominant side ([place]) 09/02/2016  . Intertrigo 08/16/2016  . Vitamin D deficiency 08/08/2016  . Gout 03/24/2016  . Syncope 02/29/2016  . Concussion 02/29/2016  . Arthritis of left wrist 02/07/2016  . History of CVA (cerebrovascular accident) 11/03/2015  . Aortic atherosclerosis ([place])   . Anemia 06/18/2015  . Sensorineural hearing loss (SNHL) of both ears 03/29/2015  . Pain of right middle finger 12/22/2014  . Memory deficit 12/04/2014  . Primary osteoarthritis of left knee 01/27/2014  . Advanced care planning/counseling discussion 12/11/2013  . Medicare annual wellness visit, subsequent 12/11/2013  . Health maintenance examination 12/11/2013  . CAD (coronary artery disease)/coronary calcifications   . Chest pain 07/17/2013  . Benign paroxysmal positional vertigo 05/26/2013  . Hyperlipidemia associated with type 2 diabetes mellitus ([place])   . Severe obesity (BMI 35.0-39.9) with comorbidity ([place])   . History of pulmonary embolism   . Osteoarthritis   . Diabetes mellitus type 2, uncontrolled, with complications ([place])   . History of DVT (deep vein thrombosis) 12/21/2010  . Resistant hypertension 05/13/2009    Immunization History  Administered Date(s) Administered  . Fluad Quad(high Dose 65+) 03/30/2020  . PFIZER Comirnaty([place])Covid-19 Tri-Sucrose Vaccine 06/22/2020  . PPD Test 09/25/2014  . Pneumococcal Conjugate-13 09/10/2013  . Pneumococcal Polysaccharide-23 03/10/2015  . Zoster 04/21/2014    Conditions to be addressed/monitored:  Hypertension, Hyperlipidemia, Diabetes, Coronary Artery Disease and Chronic Kidney Disease  Care Plan : [place]  Updates made by  [name], [place] since 06/25/2020 12:00 AM    Problem: [facility] AMB "PATIENT-SPECIFIC PROBLEM"     Goal: Disease Management   Start Date: 06/25/2020  Priority: High  Note:    Current Barriers:  . Unable to self administer medications as prescribed . Medications not synced and gaps in refills   Pharmacist Clinical Goal(s):  Marland Kitchen Over the next 30 days, patient will adhere to prescribed medication regimen as evidenced by patient report and refill history through collaboration with PharmD and provider.   Interventions: . 1:1 collaboration with [name], MD regarding development and update of comprehensive plan of care as evidenced by provider attestation and co-signature . Inter-disciplinary care team collaboration (see longitudinal plan of care) . Comprehensive medication review performed; medication list updated in electronic medical record  Hypertension (BP goal <140/90) -Not ideally controlled -Current treatment:  Hydralazine 50 mg - 1 tablet BID   Amlodipine 5 mg - 1 tablet daily  Metoprolol Succinate 25 mg - 1 tablet daily  Losartan-HCTZ 100-12.5 mg - 1 tablet daily  Potassium Chloride 10 mEq - 1 tablet every Monday, Wednesday, Friday -Medications previously tried: none   -Checks BP with arm cuff, 2-3 days per week -She is taking Motrin 200 mg, 1 tablet PRN (max 1/day) and Tylenol PRN this week due to some aches following COVID-19 vaccine -Reports BP is usually not high when she checks at home. Thinks it high yesterday due to pain and Advil intake. -Current home readings: 148/73, 54 (yesterday morning), 188/86, 54 (last night) -She is currently out of losartan/HCTZ and has less than 2 weeks of metoprolol, hydralazine and potassium. -Denies hypotensive/hypertensive symptoms -  Educated on Importance of home blood pressure monitoring; Proper BP monitoring technique; -Counseled to monitor BP at home 2-3 days per week, document, and provide log at future  appointments -Recommended to continue current medication; Improve adherence with pill packaging and med-sync. Monthly BP log reviews.  Hyperlipidemia: (LDL goal < 70) -Controlled -Current treatment:  Atorvastatin 40 mg - 1 tablet daily   Clopidogrel 75 mg - 1 tablet daily   Nitroglycerin 0.4 mg SL - PRN -Medications previously tried: none -Educated on Benefits of statin for ASCVD risk reduction; -Recommended to continue current medication  Diabetes (A1c goal <7%) Query -Controlled -Current medications:  Sitagliptin 50 mg - 1 tablet daily    Basaglar - Inject 18 units daily (confirms bedtime) -Medications previously tried: metformin intolerance -Current home glucose readings - checking a couple times per week . fasting glucose: 120s (yesterday) . bedtime: 145-150 -Denies hypoglycemic/hyperglycemic symptoms -Educated onA1c and blood sugar goals; Benefits of routine self-monitoring of blood sugar; -Counseled to check feet daily and get yearly eye exams -Recommended to continue current medication; Last A1c elevated due to noncompliance with insulin due to mix up with pharmacy. Now taking as prescribed and BG within goal. Recommend monitoring BG at least 3 days per week.  Patient Goals/Self-Care Activities . Over the next 30 days, patient will:  - focus on medication adherence by coordinating medication sync and pill packaging  Follow Up Plan: The care management team will reach out to the patient again over the next 30 days. CMA call in 4 weeks for adherence review, BP, and BG log,  Medication Assistance: [place] obtained through   medication assistance program.  Enrollment ends end of 2022.       Patient's preferred pharmacy is:  [id number]: [phone]: [phone] Reports she has 1 evening medication - hydralazine  All other in morning  Uses pill box?  Yes Stressed about trying to keep up with medication/refills. Reports she feels better when she takes her medication.   We discussed: Benefits of medication synchronization, packaging and delivery as well as enhanced pharmacist oversight with [facility]. Patient decided to: Utilize [facility] pharmacy for medication synchronization, packaging and delivery  Care Plan and Follow Up Patient Decision:  Patient agrees to Care Plan and Follow-up.  MDebbora Dus PharmD Clinical Pharmacist [phone]

## 2020-06-25 NOTE — Patient Instructions (Signed)
Dear Kristen [name],  Below is a summary of the goals we discussed during our follow up appointment on June 25, 2020. Please contact me anytime with questions or concerns.   Visit Information    Patient Care Plan: CCM Pharmacy Care Plan    Problem Identified: [facility] AMB "PATIENT-SPECIFIC PROBLEM"     Goal: Disease Management   Start Date: 06/25/2020  Priority: High  Note:    Current Barriers:  . Unable to self administer medications as prescribed . Medications not synced and gaps in refills   Pharmacist Clinical Goal(s):  Marland Kitchen Over the next 30 days, patient will adhere to prescribed medication regimen as evidenced by patient report and refill history through collaboration with PharmD and provider.   Interventions: . 1:1 collaboration with [name], MD regarding development and update of comprehensive plan of care as evidenced by provider attestation and co-signature . Inter-disciplinary care team collaboration (see longitudinal plan of care) . Comprehensive medication review performed; medication list updated in electronic medical record  Hypertension (BP goal <140/90) -Not ideally controlled -Current treatment:  Hydralazine 50 mg - 1 tablet BID   Amlodipine 5 mg - 1 tablet daily  Metoprolol Succinate 25 mg - 1 tablet daily  Losartan-HCTZ 100-12.5 mg - 1 tablet daily  Potassium Chloride 10 mEq - 1 tablet every Monday, Wednesday, Friday -Medications previously tried: none   -Checks BP with arm cuff, 2-3 days per week -She is taking Motrin 200 mg, 1 tablet PRN (max 1/day) and Tylenol PRN this week due to some aches following COVID-19 vaccine -Reports BP is usually not high when she checks at home. Thinks it high yesterday due to pain and Advil intake. -Current home readings: 148/73, 54 (yesterday morning), 188/86, 54 (last night) -She is currently out of losartan/HCTZ and has less than 2 weeks of metoprolol, hydralazine and potassium. -Denies hypotensive/hypertensive  symptoms -Educated on Importance of home blood pressure monitoring; Proper BP monitoring technique; -Counseled to monitor BP at home 2-3 days per week, document, and provide log at future appointments -Recommended to continue current medication; Improve adherence with pill packaging and med-sync. Monthly BP log reviews.  Hyperlipidemia: (LDL goal < 70) -Controlled -Current treatment:  Atorvastatin 40 mg - 1 tablet daily   Clopidogrel 75 mg - 1 tablet daily   Nitroglycerin 0.4 mg SL - PRN -Medications previously tried: none -Educated on Benefits of statin for ASCVD risk reduction; -Recommended to continue current medication  Diabetes (A1c goal <7%) Query -Controlled -Current medications:  Sitagliptin 50 mg - 1 tablet daily    Basaglar - Inject 18 units daily (confirms bedtime) -Medications previously tried: metformin intolerance -Current home glucose readings - checking a couple times per week . fasting glucose: 120s (yesterday) . bedtime: 145-150 -Denies hypoglycemic/hyperglycemic symptoms -Educated onA1c and blood sugar goals; Benefits of routine self-monitoring of blood sugar; -Counseled to check feet daily and get yearly eye exams -Recommended to continue current medication; Last A1c elevated due to noncompliance with insulin due to mix up with pharmacy. Now taking as prescribed and BG within goal. Recommend monitoring BG at least 3 days per week.  Patient Goals/Self-Care Activities . Over the next 30 days, patient will:  - focus on medication adherence by coordinating medication sync and pill packaging  Follow Up Plan: The care management team will reach out to the patient again over the next 30 days. CMA call in 4 weeks for adherence review, BP, and BG log,  Medication Assistance: Basaglar and Januvia obtained through   medication  assistance program.  Enrollment ends end of 2022.       Verbal consent obtained for [facility] enhanced pharmacy services  (medication synchronization, adherence packaging, delivery coordination). A medication sync plan was created to allow patient to get all medications delivered once every 30 to 90 days per patient preference. Patient understands they have freedom to choose pharmacy and clinical pharmacist will coordinate care between all prescribers and [facility].  The patient verbalized understanding of instructions, educational materials, and care plan provided today and declined offer to receive copy of patient instructions, educational materials, and care plan.  The pharmacy team will reach out to the patient again over the next 30 days.   [name], PharmD Clinical Pharmacist [facility] at [facility] [phone]   Diabetes Mellitus and [place] care is an important part of your health, especially when you have diabetes. Diabetes may cause you to have problems because of poor blood flow (circulation) to your feet and legs, which can cause your skin to:  Become thinner and drier.  Break more easily.  Heal more slowly.  Peel and crack. You may also have nerve damage (neuropathy) in your legs and feet, causing decreased feeling in them. This means that you may not notice minor injuries to your feet that could lead to more serious problems. Noticing and addressing any potential problems early is the best way to prevent future foot problems. How to care for your feet Foot hygiene  Wash your feet daily with warm water and mild soap. Do not use hot water. Then, pat your feet and the areas between your toes until they are completely dry. Do not soak your feet as this can dry your skin.  Trim your toenails straight across. Do not dig under them or around the cuticle. File the edges of your nails with an emery board or nail file.  Apply a moisturizing lotion or petroleum jelly to the skin on your feet and to dry, brittle toenails. Use lotion that does not contain alcohol and is  unscented. Do not apply lotion between your toes.   Shoes and socks  Wear clean socks or stockings every day. Make sure they are not too tight. Do not wear knee-high stockings since they may decrease blood flow to your legs.  Wear shoes that fit properly and have enough cushioning. Always look in your shoes before you put them on to be sure there are no objects inside.  To break in new shoes, wear them for just a few hours a day. This prevents injuries on your feet. Wounds, scrapes, corns, and calluses  Check your feet daily for blisters, cuts, bruises, sores, and redness. If you cannot see the bottom of your feet, use a mirror or ask someone for help.  Do not cut corns or calluses or try to remove them with medicine.  If you find a minor scrape, cut, or break in the skin on your feet, keep it and the skin around it clean and dry. You may clean these areas with mild soap and water. Do not clean the area with peroxide, alcohol, or iodine.  If you have a wound, scrape, corn, or callus on your foot, look at it several times a day to make sure it is healing and not infected. Check for: ? Redness, swelling, or pain. ? Fluid or blood. ? Warmth. ? Pus or a bad smell.   General tips  Do not cross your legs. This may decrease blood flow  to your feet.  Do not use heating pads or hot water bottles on your feet. They may burn your skin. If you have lost feeling in your feet or legs, you may not know this is happening until it is too late.  Protect your feet from hot and cold by wearing shoes, such as at the beach or on hot pavement.  Schedule a complete foot exam at least once a year (annually) or more often if you have foot problems. Report any cuts, sores, or bruises to your health care provider immediately. Where to find more information  American Diabetes Association: www.diabetes.org  Association of Diabetes Care & Education Specialists: www.diabeteseducator.org Contact a health care  provider if:  You have a medical condition that increases your risk of infection and you have any cuts, sores, or bruises on your feet.  You have an injury that is not healing.  You have redness on your legs or feet.  You feel burning or tingling in your legs or feet.  You have pain or cramps in your legs and feet.  Your legs or feet are numb.  Your feet always feel cold.  You have pain around any toenails. Get help right away if:  You have a wound, scrape, corn, or callus on your foot and: ? You have pain, swelling, or redness that gets worse. ? You have fluid or blood coming from the wound, scrape, corn, or callus. ? Your wound, scrape, corn, or callus feels warm to the touch. ? You have pus or a bad smell coming from the wound, scrape, corn, or callus. ? You have a fever. ? You have a red line going up your leg. Summary  Check your feet every day for blisters, cuts, bruises, sores, and redness.  Apply a moisturizing lotion or petroleum jelly to the skin on your feet and to dry, brittle toenails.  Wear shoes that fit properly and have enough cushioning.  If you have foot problems, report any cuts, sores, or bruises to your health care provider immediately.  Schedule a complete foot exam at least once a year (annually) or more often if you have foot problems. This information is not intended to replace advice given to you by your health care provider. Make sure you discuss any questions you have with your health care provider. Document Revised: 10/30/2019 Document Reviewed: 10/30/2019 Elsevier Patient Education  [employer].

## 2020-06-25 NOTE — Chronic Care Management (AMB) (Addendum)
Per patient request contacted [facility] to coordinate patient's medications for delivery, med sync, and adherence packaging from [facility]. [facility] agreed and will transfer patients medications. [facility] currently had Test Stri[facility] patient, asked for them to back the test strips out and also send to [facility].   Follow-Up:  Coordination of Enhanced Pharmacy Services and Pharmacist Review  [name], CPP notified  Margaretmary Dys, [place] [phone]  Total time spent for month: 1:30:00

## 2020-06-25 NOTE — Telephone Encounter (Signed)
-----   Message from [place], [facility] sent at 06/25/2020 12:05 PM EST ----- Regarding: Refills Please send refills on atorvastatin, hydralazine, metoprolol, losartan/HCTZ and potassium to [facility]. Pt out of some medications and trying to coordinate med sync.  Thanks!  [name], PharmD Clinical Pharmacist [facility] at [facility] [phone]

## 2020-06-25 NOTE — Telephone Encounter (Signed)
E-scribed refills.  

## 2020-06-27 ENCOUNTER — Other Ambulatory Visit: Payer: Self-pay | Admitting: Family Medicine

## 2020-06-28 ENCOUNTER — Telehealth: Payer: Self-pay

## 2020-06-28 MED ORDER — BD PEN NEEDLE MINI U/F 31G X 5 MM MISC: 3 refills | Status: DC

## 2020-06-28 MED ORDER — POTASSIUM CHLORIDE ER 10 MEQ PO TBCR: 10.0000 meq | EXTENDED_RELEASE_TABLET | ORAL | 3 refills | Status: DC

## 2020-06-28 MED ORDER — AMLODIPINE BESYLATE 5 MG PO TABS: 5.0000 mg | ORAL_TABLET | Freq: Every day | ORAL | 3 refills | Status: DC

## 2020-06-28 MED ORDER — CLOPIDOGREL BISULFATE 75 MG PO TABS: 75.0000 mg | ORAL_TABLET | Freq: Every day | ORAL | 3 refills | Status: DC

## 2020-06-28 NOTE — Telephone Encounter (Addendum)
Spoke with Joaquim Lai [facility] nurse) and she was able to help patient call the patient assistance program for Basaglar refill. The program does not supply pen needles and she only has 10 remaining. Coordinated refill and delivery of pen needles from [facility].  [name], PharmD Clinical Pharmacist [facility] at [facility] [phone]  Total time for month: 2:11:00

## 2020-06-28 NOTE — Telephone Encounter (Signed)
-----   Message from [place], [facility] sent at 06/28/2020  3:48 PM EST ----- Refill request on insulin pen needles, amlodipine, clopidogrel, and potassium to [facility]. [facility] but these rx's were not sent.  Thanks!  [name], PharmD Clinical Pharmacist [facility] at [facility] [phone]

## 2020-06-28 NOTE — Telephone Encounter (Signed)
E-scribed refills to [facility]. 

## 2020-06-28 NOTE — Chronic Care Management (AMB) (Signed)
[facility]ed [facility] to request medication profile be transferred to [facility]. Also contacted [facility] to inquire about Basaglar delivery. Representative states patient must call since delivery will be going to her house and not the provider office. Patient was contacted and explained that she would need to call to request delivery and to be added to auto refill. Number for [facility] was provided to patient.    Follow-Up:  Patient Assistance Coordination and Pharmacist Review  [name], CPP notified  Margaretmary Dys, [place] [phone]  Total time spent for month: 1:55:00

## 2020-07-07 ENCOUNTER — Other Ambulatory Visit: Payer: Self-pay

## 2020-07-07 ENCOUNTER — Ambulatory Visit (INDEPENDENT_AMBULATORY_CARE_PROVIDER_SITE_OTHER): Payer: Medicare Other | Admitting: Family Medicine

## 2020-07-07 ENCOUNTER — Encounter: Payer: Self-pay | Admitting: Family Medicine

## 2020-07-07 DIAGNOSIS — L304 Erythema intertrigo: Secondary | ICD-10-CM

## 2020-07-07 MED ORDER — NYSTATIN 100000 UNIT/GM EX CREA: 1.0000 "application " | TOPICAL_CREAM | Freq: Two times a day (BID) | CUTANEOUS | 0 refills | Status: DC

## 2020-07-07 NOTE — Assessment & Plan Note (Addendum)
Recurrent intertrigo, mild with fissuring of skin - treat with desitin barrier cream and nystatin PRN. Skin care instructions provided. Handout provided.

## 2020-07-07 NOTE — Progress Notes (Signed)
Patient ID: Kristen [name], female    DOB: 1941-03-20, 80 y.o.   MRN: [id number]  This visit was conducted in person.  BP 120/66   Pulse (!) 58   Temp 97.6 F (36.4 C) (Temporal)   Ht _0  (1.549 m)   Wt 192 lb 2 oz (87.1 kg)   SpO2 98%   BMI 36.30 kg/m    CC: rash  Subjective:   HPI: Kristen [name] is a 80 y.o. female presenting on 07/07/2020 for Rash (C/o rash beneath bilateral breasts.  Noticed about 2 mos ago.  States she gets it yearly when temps warm up. )   2 mo h/o rash below breasts. Comes every summer when weather warms up. Associated with mild itching.   Great BP control today.  Fasting cbg this morning 130s.      Relevant past medical, surgical, family and social history reviewed and updated as indicated. Interim medical history since our last visit reviewed. Allergies and medications reviewed and updated. Outpatient Medications Prior to Visit  Medication Sig Dispense Refill  . amLODipine (NORVASC) 5 MG tablet Take 1 tablet (5 mg total) by mouth daily. 90 tablet 3  . atorvastatin (LIPITOR) 40 MG tablet Take 1 tablet (40 mg total) by mouth daily. 90 tablet 3  . blood glucose meter kit and supplies KIT Dispense based on patient and insurance preference. Use up to four times daily as directed. (FOR ICD-9 250.00, 250.01). 1 each 0  . Blood Glucose Monitoring Suppl (ONETOUCH VERIO) w/Device KIT Use to check blood sugar up to 4 times a day as directed 1 kit 0  . Cholecalciferol (VITAMIN D) 2000 units CAPS Take 1 capsule by mouth daily.    . clopidogrel (PLAVIX) 75 MG tablet Take 1 tablet (75 mg total) by mouth daily. 90 tablet 3  . glucose blood (ONETOUCH VERIO) test strip USE TO CHECK BLOOD SUGAR UP TO 4 TIMES DAILY AS DIRECTED 100 each 11  . hydrALAZINE (APRESOLINE) 50 MG tablet Take 1 tablet (50 mg total) by mouth 2 (two) times daily. 180 tablet 3  . Insulin Glargine (BASAGLAR KWIKPEN) 100 UNIT/ML Inject 18 Units into the skin daily. Gets through PAP    . Insulin  Pen Needle (B-D UF III MINI PEN NEEDLES) 31G X 5 MM MISC INJECT 1 EACH AS DIRECTED AT BEDTIME. USE AS DIRECTED TO INJECT INSULIN. DX CODE: E11.8, E11.65 100 each 3  . Lancets (ONETOUCH ULTRASOFT) lancets Use to check blood sugar up to 4 times a day as directed 100 each 11  . losartan-hydrochlorothiazide (HYZAAR) 100-12.5 MG tablet Take 1 tablet by mouth daily. 90 tablet 3  . metoprolol succinate (TOPROL-XL) 25 MG 24 hr tablet TAKE 1 TABLET BY MOUTH EVERY DAY 90 tablet 3  . nitroGLYCERIN (NITROSTAT) 0.4 MG SL tablet Place 0.4 mg under the tongue every 5 (five) minutes as needed for chest pain.    Marland Kitchen PEG-KCl-NaCl-NaSulf-Na Asc-C (PLENVU) 140 g SOLR Use as directed for colonoscopy prep. 1 each 0  . potassium chloride (KLOR-CON) 10 MEQ tablet Take 1 tablet (10 mEq total) by mouth every Monday, Wednesday, and Friday. 40 tablet 3  . sitaGLIPtin (JANUVIA) 50 MG tablet Take 1 tablet (50 mg total) by mouth daily. (Patient taking differently: Take 50 mg by mouth daily. Gets through PAP) 90 tablet 3   No facility-administered medications prior to visit.     Per HPI unless specifically indicated in ROS section below Review of Systems Objective:  BP 120/66  Pulse (!) 58   Temp 97.6 F (36.4 C) (Temporal)   Ht _0  (1.549 m)   Wt 192 lb 2 oz (87.1 kg)   SpO2 98%   BMI 36.30 kg/m   Wt Readings from Last 3 Encounters:  07/07/20 192 lb 2 oz (87.1 kg)  06/16/20 192 lb (87.1 kg)  04/07/20 193 lb 9 oz (87.8 kg)      Physical Exam Vitals and nursing note reviewed.  Constitutional:      Appearance: Normal appearance. She is obese. She is not ill-appearing.  Skin:    General: Skin is warm and dry.     Findings: Rash present.     Comments: Hyperpigmentation below bilateral breasts at skin fold with small fissures present  Neurological:     Mental Status: She is alert.       Results for orders placed or performed in visit on 05/07/20  Fecal occult blood, imunochemical   Specimen: Stool  Result  Value Ref Range   Fecal Occult Bld Positive (A) Negative  pending colonoscopy next month.  Assessment & Plan:  This visit occurred during the SARS-CoV-2 public health emergency.  Safety protocols were in place, including screening questions prior to the visit, additional usage of staff PPE, and extensive cleaning of exam room while observing appropriate contact time as indicated for disinfecting solutions.   Problem List Items Addressed This Visit    Intertrigo    Recurrent intertrigo, mild with fissuring of skin - treat with desitin barrier cream and nystatin PRN. Skin care instructions provided. Handout provided.           Meds ordered this encounter  Medications  . nystatin cream (MYCOSTATIN)    Sig: Apply 1 application topically 2 (two) times daily.    Dispense:  30 g    Refill:  0   No orders of the defined types were placed in this encounter.   Patient instructions: Blood pressures are great today! You have rash due to fungal infection.  Use barrier cream (Desitin) as well as antifungal cream sent to pharmacy Let us know if no better with this  Follow up plan: Return if symptoms worsen or fail to improve.  [name], MD

## 2020-07-07 NOTE — Patient Instructions (Addendum)
Blood pressures are great today! You have rash due to fungal infection.  Use barrier cream (Desitin) as well as antifungal cream sent to pharmacy Let us know if no better with this  Intertrigo Intertrigo is skin irritation or inflammation (dermatitis) that occurs when folds of skin rub together. The irritation can cause a rash and make skin raw and itchy. This condition most commonly occurs in the skin folds of these areas:  Toes.  Armpits.  Groin.  Under the belly.  Under the breasts.  Buttocks. Intertrigo is not passed from person to person (is not contagious). What are the causes? This condition is caused by heat, moisture, rubbing (friction), and not enough air circulation. The condition can be made worse by:  Sweat.  Bacteria.  A fungus, such as yeast. What increases the risk? This condition is more likely to occur if you have moisture in your skin folds. You are more likely to develop this condition if you:  Have diabetes.  Are overweight.  Are not able to move around or are not active.  Live in a warm and moist climate.  Wear splints, braces, or other medical devices.  Are not able to control your bowels or bladder (have incontinence). What are the signs or symptoms? Symptoms of this condition include:  A pink or red skin rash in the skin fold or near the skin fold.  Raw or scaly skin.  Itchiness.  A burning feeling.  Bleeding.  Leaking fluid.  A bad smell. How is this diagnosed? This condition is diagnosed with a medical history and physical exam. You may also have a skin swab to test for bacteria or a fungus. How is this treated? This condition may be treated by:  Cleaning and drying your skin.  Taking an antibiotic medicine or using an antibiotic skin cream for a bacterial infection.  Using an antifungal cream on your skin or taking pills for an infection that was caused by a fungus, such as yeast.  Using a steroid ointment to relieve  itchiness and irritation.  Separating the skin fold with a clean cotton cloth to absorb moisture and allow air to flow into the area. Follow these instructions at home:  Keep the affected area clean and dry.  Do not scratch your skin.  Stay in a cool environment as much as possible. Use an air conditioner or fan, if available.  Apply over-the-counter and prescription medicines only as told by your health care provider.  If you were prescribed an antibiotic medicine, use it as told by your health care provider. Do not stop using the antibiotic even if your condition improves.  Keep all follow-up visits as told by your health care provider. This is important. How is this prevented?  Maintain a healthy weight.  Take care of your feet, especially if you have diabetes. Foot care includes: ? Wearing shoes that fit well. ? Keeping your feet dry. ? Wearing clean, breathable socks.  Protect the skin around your groin and buttocks, especially if you have incontinence. Skin protection includes: ? Following a regular cleaning routine. ? Using skin protectant creams, powders, or ointments. ? Changing protection pads frequently.  Do not wear tight clothes. Wear clothes that are loose, absorbent, and made of cotton.  Wear a bra that gives good support, if needed.  Shower and dry yourself well after activity or exercise. Use a hair dryer on a cool setting to dry between skin folds, especially after you bathe.  If you have diabetes, keep  your blood sugar under control.   Contact a health care provider if:  Your symptoms do not improve with treatment.  Your symptoms get worse or they spread.  You notice increased redness and warmth.  You have a fever. Summary  Intertrigo is skin irritation or inflammation (dermatitis) that occurs when folds of skin rub together.  This condition is caused by heat, moisture, rubbing (friction), and not enough air circulation.  This condition may be  treated by cleaning and drying your skin and with medicines.  Apply over-the-counter and prescription medicines only as told by your health care provider.  Keep all follow-up visits as told by your health care provider. This is important. This information is not intended to replace advice given to you by your health care provider. Make sure you discuss any questions you have with your health care provider. Document Revised: 09/10/2017 Document Reviewed: 09/10/2017 Elsevier Patient Education  2021 [employer].

## 2020-07-07 NOTE — Telephone Encounter (Signed)
Blood thinner: plavix 75mg  daily Indication: old cerebellar strokes, last acute CVA 2014.  Also with h/o PE 2012 and LE DVT 2017 - she declined lifelong AC.   Recommendation: ok to hold plavix 75mg  for 5d prior to surgery. Would restart plavix 1d postop if ok by GI.  Question: would GI be ok with bridging with aspirin 81mg  daily while plavix on hold? If preferred not, ok to hold all antiplatelet before colonoscopy as above.

## 2020-07-08 NOTE — Telephone Encounter (Addendum)
Kristen [name] - plz notify patient -  We would like her to hold her plavix for 5 days prior to her colonoscopy on the 25th of next month. Hold plavix starting from April 20th.  I want her to take aspirin 81mg  OTC instead of plavix for those 5 days.  She will likely restart plavix (and stop aspirin) after colonoscopy if ok by GI after procedure.

## 2020-07-08 NOTE — Telephone Encounter (Signed)
Yes okay to use aspirin while holding Plavix, that is fine. Thank you for asking.

## 2020-07-08 NOTE — Telephone Encounter (Signed)
Dr. Havery Moros please advise on PCP question about the ASA 81 mg. Thank you!

## 2020-07-09 NOTE — Telephone Encounter (Addendum)
Plz clarify instructions in previous message.

## 2020-07-09 NOTE — Telephone Encounter (Signed)
Thank you - I've corrected prior instructions.

## 2020-07-09 NOTE — Telephone Encounter (Signed)
Attempted to contact pt.  No answer.  No vm.  Need to relay Dr. Synthia Innocent instructions.

## 2020-07-13 ENCOUNTER — Ambulatory Visit: Payer: Medicare Other | Attending: Internal Medicine

## 2020-07-13 ENCOUNTER — Other Ambulatory Visit (HOSPITAL_COMMUNITY): Payer: Self-pay | Admitting: Internal Medicine

## 2020-07-13 DIAGNOSIS — Z23 Encounter for immunization: Secondary | ICD-10-CM

## 2020-07-13 NOTE — Progress Notes (Signed)
   Covid-19 Vaccination Clinic  Name:  JASIMINE SIMMS    MRN: [id number] DOB: 05-Apr-1941  07/13/2020  Ms. Seefeld was observed post Covid-19 immunization for 15 minutes without incident. She was provided with Vaccine Information Sheet and instruction to access the V-Safe system.   Ms. Manternach was instructed to call 911 with any severe reactions post vaccine: Marland Kitchen Difficulty breathing  . Swelling of face and throat  . A fast heartbeat  . A bad rash all over body  . Dizziness and weakness   Immunizations Administered    Name Date Dose VIS Date Route   PFIZER Comrnaty(Gray TOP) Covid-19 Vaccine 07/13/2020  9:16 AM 0.3 mL 04/01/2020 Intramuscular   Manufacturer: [employer], [employer]   Lot: [id number]   NDC: [phone]

## 2020-07-14 NOTE — Telephone Encounter (Signed)
Patient notified as instructed by telephone and verbalized understanding. Patient wrote down the instructions while on the phone and read them back to me.

## 2020-07-23 HISTORY — PX: ESOPHAGOGASTRODUODENOSCOPY: SHX1529

## 2020-07-23 HISTORY — PX: COLONOSCOPY: SHX174

## 2020-07-26 ENCOUNTER — Telehealth: Payer: Self-pay

## 2020-07-26 NOTE — Chronic Care Management (AMB) (Addendum)
  Chronic Care Management Pharmacy Assistant   Name: Kristen [name]  MRN: [id number] DOB: 02/11/1941  Reason for Encounter: Disease State   HTN and DM   Recent office visits:  07/07/2020  Dr.Gutierrez  PCP   Recent consult visits:  No recent consults since CCM visit  Hospital visits:  Admitted to the hospital on 03/15/2021 due to Hypertensive Urgency  Discharge date was 03/19/2020 Discharged from  Hospital.    Medications: Outpatient Encounter Medications as of 07/26/2020  Medication Sig Note   amLODipine (NORVASC) 5 MG tablet Take 1 tablet (5 mg total) by mouth daily.    atorvastatin (LIPITOR) 40 MG tablet Take 1 tablet (40 mg total) by mouth daily.    blood glucose meter kit and supplies KIT Dispense based on patient and insurance preference. Use up to four times daily as directed. (FOR ICD-9 250.00, 250.01).    Blood Glucose Monitoring Suppl (ONETOUCH VERIO) w/Device KIT Use to check blood sugar up to 4 times a day as directed    Cholecalciferol (VITAMIN D) 2000 units CAPS Take 1 capsule by mouth daily.    clopidogrel (PLAVIX) 75 MG tablet Take 1 tablet (75 mg total) by mouth daily.    COVID-19 mRNA Vac-TriS, Pfizer, SUSP injection USE AS DIRECTED    COVID-19 mRNA Vac-TriS, Pfizer, SUSP injection USE AS DIRECTED    glucose blood (ONETOUCH VERIO) test strip USE TO CHECK BLOOD SUGAR UP TO 4 TIMES DAILY AS DIRECTED    hydrALAZINE (APRESOLINE) 50 MG tablet Take 1 tablet (50 mg total) by mouth 2 (two) times daily.    Insulin Glargine (BASAGLAR KWIKPEN) 100 UNIT/ML Inject 18 Units into the skin daily. Gets through PAP    Insulin Pen Needle (B-D UF III MINI PEN NEEDLES) 31G X 5 MM MISC INJECT 1 EACH AS DIRECTED AT BEDTIME. USE AS DIRECTED TO INJECT INSULIN. DX CODE: E11.8, E11.65    Lancets (ONETOUCH ULTRASOFT) lancets Use to check blood sugar up to 4 times a day as directed    losartan-hydrochlorothiazide (HYZAAR) 100-12.5 MG tablet Take 1 tablet by mouth daily.    metoprolol  succinate (TOPROL-XL) 25 MG 24 hr tablet TAKE 1 TABLET BY MOUTH EVERY DAY    nitroGLYCERIN (NITROSTAT) 0.4 MG SL tablet Place 0.4 mg under the tongue every 5 (five) minutes as needed for chest pain. 04/01/2020: Reports has not needed recently    nystatin cream (MYCOSTATIN) Apply 1 application topically 2 (two) times daily.    PEG-KCl-NaCl-NaSulf-Na Asc-C (PLENVU) 140 g SOLR Use as directed for colonoscopy prep.    potassium chloride (KLOR-CON) 10 MEQ tablet Take 1 tablet (10 mEq total) by mouth every Monday, Wednesday, and Friday.    sitaGLIPtin (JANUVIA) 50 MG tablet Take 1 tablet (50 mg total) by mouth daily. (Patient taking differently: Take 50 mg by mouth daily. Gets through PAP)    No facility-administered encounter medications on file as of 07/26/2020.   Recent Relevant Labs: Lab Results  Component Value Date/Time   HGBA1C 10.7 (H) 03/16/2020 05:37 AM   HGBA1C 7.6 (A) 09/25/2019 10:07 AM   HGBA1C 8.2 (H) 03/24/2019 09:57 AM   MICROALBUR 7.8 (H) 08/09/2016 10:28 AM   MICROALBUR 2.6 (H) 12/08/2013 10:31 AM    Kidney Function Lab Results  Component Value Date/Time   CREATININE 1.00 04/07/2020 11:08 AM   CREATININE 1.29 (H) 03/30/2020 10:36 AM   CREATININE 1.34 (H) 03/10/2014 12:33 PM   CREATININE 0.97 07/02/2013 12:32 AM   GFR 53.90 (L) 04/07/2020 11:08   AM   GFRNONAA 41 (L) 03/19/2020 03:26 AM   GFRNONAA 41 (L) 03/10/2014 12:33 PM   GFRNONAA 59 (L) 07/02/2013 12:32 AM   GFRAA >60 02/20/2018 10:26 AM   GFRAA 50 (L) 03/10/2014 12:33 PM   GFRAA >60 07/02/2013 12:32 AM    Diabetes  Current antihyperglycemic regimen:  Sitagliptin 50 mg - 1 tablet daily   Basaglar - Inject 18 units daily at bedtime Patient verbally confirms she is taking the above medications as directed. Yes  What recent interventions/DTPs have been made to improve glycemic control:  Sharyn Lull asked patient to check BG at least 3 days per week.  Have there been any recent hospitalizations or ED visits since  last visit with CPP? No  Patient denies hypoglycemic symptoms, including Pale, Sweaty, Shaky, Hungry, Nervous/irritable and Vision changes  Patient denies hyperglycemic symptoms, including blurry vision, excessive thirst, fatigue, polyuria and weakness  How often are you checking your blood sugar?  Infrequently   What are your blood sugars ranging?  Fasting: 07/27/2020 118 Before meals:  N/A After meals:   N/A Bedtime: 07/27/2020  190  On insulin? Yes How many units:  18 units   During the week, how often does your blood glucose drop below 70? Never  Are you checking your feet daily/regularly? Yes  Adherence Review: Is the patient currently on a STATIN medication? Yes Is the patient currently on ACE/ARB medication? Yes Does the patient have >5 day gap between last estimated fill dates? CPP to review  Recent Office Vitals: BP Readings from Last 3 Encounters:  07/07/20 120/66  06/16/20 130/70  04/07/20 136/70   Pulse Readings from Last 3 Encounters:  07/07/20 (!) 58  06/16/20 (!) 51  04/07/20 65    Wt Readings from Last 3 Encounters:  07/07/20 192 lb 2 oz (87.1 kg)  06/16/20 192 lb (87.1 kg)  04/07/20 193 lb 9 oz (87.8 kg)     Kidney Function Lab Results  Component Value Date/Time   CREATININE 1.00 04/07/2020 11:08 AM   CREATININE 1.29 (H) 03/30/2020 10:36 AM   CREATININE 1.34 (H) 03/10/2014 12:33 PM   CREATININE 0.97 07/02/2013 12:32 AM   GFR 53.90 (L) 04/07/2020 11:08 AM   GFRNONAA 41 (L) 03/19/2020 03:26 AM   GFRNONAA 41 (L) 03/10/2014 12:33 PM   GFRNONAA 59 (L) 07/02/2013 12:32 AM   GFRAA >60 02/20/2018 10:26 AM   GFRAA 50 (L) 03/10/2014 12:33 PM   GFRAA >60 07/02/2013 12:32 AM    BMP Latest Ref Rng & Units 04/07/2020 03/30/2020 03/19/2020  Glucose 70 - 99 mg/dL 255(H) 444(H) 108(H)  BUN 6 - 23 mg/dL 21 22 33(H)  Creatinine 0.40 - 1.20 mg/dL 1.00 1.29(H) 1.33(H)  Sodium 135 - 145 mEq/L 136 135 138  Potassium 3.5 - 5.1 mEq/L 3.9 4.4 3.4(L)  Chloride 96 -  112 mEq/L 100 96 101  CO2 19 - 32 mEq/L 29 32 25  Calcium 8.4 - 10.5 mg/dL 9.2 9.8 9.0   Hypertension  Current antihypertensive regimen:  Hydralazine 50 mg - 1 tablet BID  Amlodipine 5 mg - 1 tablet daily Metoprolol Succinate 25 mg - 1 tablet daily Losartan-HCTZ 100-12.5 mg - 1 tablet daily Potassium Chloride 10 mEq - 1 tablet every Monday, Wednesday, Friday  Patient verbally confirms she is taking the above medications as directed. Yes  How often are you checking your Blood Press ure? several times per month  she does not routinely check her BP, she will try to do this more often.She  will check this when she feels bad.  Current home BP readings: none available  DATE:             BP               PULSE  None available per the patient. She will only check when she feels bad.  Wrist or arm cuff: arm cuff Caffeine intake:   Soda on occasion  Salt intake:  She limits when possible  OTC medications including pseudoephedrine or NSAIDs? none  Any readings above 180/120? No If yes any symptoms of hypertensive emergency? change in vision and patient denies any symptoms of high blood pressure   What recent interventions/DTPs have been made by any provider to improve Blood Pressure control since last CPP Visit:   none identified  Any recent hospitalizations or ED visits since last visit with CPP? No   What diet changes have been made to improve Blood Pressure Control?  She eats healthy meals, she does use salt for cooking, tries not to add when eating.   What exercise is being done to improve your Blood Pressure Control?         She goes out, stays active, drives everywhere.   Adherence Review: Is the patient currently on ACE/ARB medication? Yes Does the patient have >5 day gap between last estimated fill dates? CPP to review   Star Rating Drugs:  Medication:  Last Fill: Day Supply  Atorvastatin 40 mg   06/29/2020           73DS  Losartan-HCTZ 100-12.55m   06/28/2020     80DS    Januvia 50 mg.  Pt. Assistance   The patient states she is happy with the [facility] delivery process.  Follow-Up:  Pharmacist Review  MDebbora Dus CPP notified  VAvel Sensor CAshlandAssistant [phone] I have reviewed the care management and care coordination activities outlined in this encounter and I am certifying that I agree with the content of this note.  No gaps in adherence. Pt medications are in sync process through [facility]. Estimated sync date 09/15/20. BP in office improved since pt started [facility] and resumed her BP medications as prescribed. BG provided are within goal.  MDebbora Dus PharmD Clinical Pharmacist [phone]

## 2020-08-02 ENCOUNTER — Other Ambulatory Visit: Payer: Self-pay | Admitting: *Deleted

## 2020-08-02 NOTE — Patient Outreach (Signed)
[facility]) Care Management  08/02/2020  Kristen [name] 28-Jun-1940 [id number]   RN Health Coach received telephone call from patient.  H[employer] compliance verified. RN Health Coach assisted patient in getting registered for coupon for plenvu prep for colonoscopy.   Plan: RN sent the patient registration papers for follow up RN sent copy to son as patient requested by email  [place] Management [phone]

## 2020-08-06 ENCOUNTER — Other Ambulatory Visit: Payer: Self-pay | Admitting: *Deleted

## 2020-08-06 NOTE — Patient Outreach (Signed)
[facility]) Care Management  08/06/2020  Kristen [name] Aug 28, 1940 [id number]  RN Health Coach telephone call to patient.  Hipaa compliance verified. RN made sure that pat patient unstands prep for colonoscopy.  Patient didn't understand the generic name for the plavix was clopidogrel and to hold the day before procedure and take aspirin.   [place] Care Management [phone]

## 2020-08-16 ENCOUNTER — Encounter: Payer: Self-pay | Admitting: Gastroenterology

## 2020-08-16 ENCOUNTER — Other Ambulatory Visit: Payer: Self-pay

## 2020-08-16 ENCOUNTER — Ambulatory Visit (AMBULATORY_SURGERY_CENTER): Payer: Medicare Other | Admitting: Gastroenterology

## 2020-08-16 VITALS — BP 161/79 | HR 57 | Temp 97.3°F | Resp 19 | Ht 61.0 in | Wt 192.0 lb

## 2020-08-16 DIAGNOSIS — K222 Esophageal obstruction: Secondary | ICD-10-CM | POA: Diagnosis not present

## 2020-08-16 DIAGNOSIS — K317 Polyp of stomach and duodenum: Secondary | ICD-10-CM

## 2020-08-16 DIAGNOSIS — K2950 Unspecified chronic gastritis without bleeding: Secondary | ICD-10-CM | POA: Diagnosis not present

## 2020-08-16 DIAGNOSIS — D123 Benign neoplasm of transverse colon: Secondary | ICD-10-CM

## 2020-08-16 DIAGNOSIS — K2970 Gastritis, unspecified, without bleeding: Secondary | ICD-10-CM | POA: Diagnosis not present

## 2020-08-16 DIAGNOSIS — D125 Benign neoplasm of sigmoid colon: Secondary | ICD-10-CM | POA: Diagnosis not present

## 2020-08-16 DIAGNOSIS — B9681 Helicobacter pylori [H. pylori] as the cause of diseases classified elsewhere: Secondary | ICD-10-CM | POA: Diagnosis not present

## 2020-08-16 DIAGNOSIS — R195 Other fecal abnormalities: Secondary | ICD-10-CM

## 2020-08-16 DIAGNOSIS — R1310 Dysphagia, unspecified: Secondary | ICD-10-CM

## 2020-08-16 MED ORDER — SODIUM CHLORIDE 0.9 % IV SOLN: 500.0000 mL | Freq: Once | INTRAVENOUS | Status: DC

## 2020-08-16 NOTE — Progress Notes (Signed)
Report given to PACU, vss 

## 2020-08-16 NOTE — Op Note (Signed)
[facility] Patient Name: Kristen [name] Procedure Date: 08/16/2020 2:17 PM MRN: [id number] Endoscopist: Remo Lipps P. Havery Moros , MD Age: 80 Referring MD:  Date of Birth: Sep 08, 1940 Gender: Female Account #: [id number] Procedure:                Colonoscopy Indications:              Heme positive stool Medicines:                Monitored Anesthesia Care Procedure:                Pre-Anesthesia Assessment:                           - Prior to the procedure, a History and Physical                            was performed, and patient medications and                            allergies were reviewed. The patient's tolerance of                            previous anesthesia was also reviewed. The risks                            and benefits of the procedure and the sedation                            options and risks were discussed with the patient.                            All questions were answered, and informed consent                            was obtained. Prior Anticoagulants: The patient has                            taken Plavix (clopidogrel), last dose was 5 days                            prior to procedure. ASA Grade Assessment: III - A                            patient with severe systemic disease. After                            reviewing the risks and benefits, the patient was                            deemed in satisfactory condition to undergo the                            procedure.  After obtaining informed consent, the colonoscope                            was passed under direct vision. Throughout the                            procedure, the patient's blood pressure, pulse, and                            oxygen saturations were monitored continuously. The                            Olympus [id number]) Colonoscope was                            introduced through the anus and advanced to the the                             cecum, identified by appendiceal orifice and                            ileocecal valve. The colonoscopy was performed                            without difficulty. The patient tolerated the                            procedure well. The quality of the bowel                            preparation was good. The ileocecal valve,                            appendiceal orifice, and rectum were photographed. Scope In: 2:38:10 PM Scope Out: 2:58:34 PM Scope Withdrawal Time: 0 hours 16 minutes 11 seconds  Total Procedure Duration: 0 hours 20 minutes 24 seconds  Findings:                 The perianal and digital rectal examinations were                            normal.                           Two sessile polyps were found in the transverse                            colon. The polyps were 4 to 5 mm in size. These                            polyps were removed with a cold snare. Resection                            and retrieval were complete.  A 18-20 mm polyp was found in the sigmoid colon.                            The polyp was pedunculated. The polyp was removed                            with a hot snare. Resection and retrieval were                            complete. To prevent bleeding after the                            polypectomy, one hemostatic clip was successfully                            placed across the defect given the size of the                            stalk and history of Plavix use. Area just distal                            to the area was tattooed with an injection of Spot                            (carbon black).                           Multiple small-mouthed diverticula were found in                            the sigmoid colon.                           The exam was otherwise without abnormality. Complications:            No immediate complications. Estimated blood loss:                            Minimal. Estimated  Blood Loss:     Estimated blood loss was minimal. Impression:               - Two 4 to 5 mm polyps in the transverse colon,                            removed with a cold snare. Resected and retrieved.                           - One 18-20 mm polyp in the sigmoid colon, removed                            with a hot snare. Resected and retrieved. Clip was                            placed prophylactically. Tattooed.                           -  Diverticulosis in the sigmoid colon.                           - The examination was otherwise normal. Recommendation:           - Patient has a contact number available for                            emergencies. The signs and symptoms of potential                            delayed complications were discussed with the                            patient. Return to normal activities tomorrow.                            Written discharge instructions were provided to the                            patient.                           - Resume previous diet.                           - Continue present medications.                           - Resume Plavix tomorrow                           - Await pathology results.                           - No ibuprofen, naproxen, or other non-steroidal                            anti-inflammatory drugs for 2 weeks after polyp                            removal. Remo Lipps P. Havery Moros, MD 08/16/2020 3:05:23 PM This report has been signed electronically.

## 2020-08-16 NOTE — Progress Notes (Signed)
1422 BP 218/92, Labetalol given IV, MD update, vss

## 2020-08-16 NOTE — Patient Instructions (Addendum)
Impression/Recommendations:  Polyp, diverticulosis,post-dilation diet,  and gastritis handouts given to patient.  Resume Plavix tomorrow.  No ibuprofen, naproxen, or other NSAID drugs for 2 weeks after polyp removal.  Continue present medications. Await pathology results.  YOU HAD AN ENDOSCOPIC PROCEDURE TODAY AT [facility]:   Refer to the procedure report that was given to you for any specific questions about what was found during the examination.  If the procedure report does not answer your questions, please call your gastroenterologist to clarify.  If you requested that your care partner not be given the details of your procedure findings, then the procedure report has been included in a sealed envelope for you to review at your convenience later.  YOU SHOULD EXPECT: Some feelings of bloating in the abdomen. Passage of more gas than usual.  Walking can help get rid of the air that was put into your GI tract during the procedure and reduce the bloating. If you had a lower endoscopy (such as a colonoscopy or flexible sigmoidoscopy) you may notice spotting of blood in your stool or on the toilet paper. If you underwent a bowel prep for your procedure, you may not have a normal bowel movement for a few days.  Please Note:  You might notice some irritation and congestion in your nose or some drainage.  This is from the oxygen used during your procedure.  There is no need for concern and it should clear up in a day or so.  SYMPTOMS TO REPORT IMMEDIATELY:   Following lower endoscopy (colonoscopy or flexible sigmoidoscopy):  Excessive amounts of blood in the stool  Significant tenderness or worsening of abdominal pains  Swelling of the abdomen that is new, acute  Fever of 100F or higher   Following upper endoscopy (EGD)  Vomiting of blood or coffee ground material  New chest pain or pain under the shoulder blades  Painful or persistently difficult swallowing  New  shortness of breath  Fever of 100F or higher  Black, tarry-looking stools  For urgent or emergent issues, a gastroenterologist can be reached at any hour by calling [phone]. Do not use MyChart messaging for urgent concerns.    DIET:  We do recommend a small meal at first, but then you may proceed to your regular diet.  Drink plenty of fluids but you should avoid alcoholic beverages for 24 hours.  ACTIVITY:  You should plan to take it easy for the rest of today and you should NOT DRIVE or use heavy machinery until tomorrow (because of the sedation medicines used during the test).    FOLLOW UP: Our staff will call the number listed on your records 48-72 hours following your procedure to check on you and address any questions or concerns that you may have regarding the information given to you following your procedure. If we do not reach you, we will leave a message.  We will attempt to reach you two times.  During this call, we will ask if you have developed any symptoms of COVID 19. If you develop any symptoms (ie: fever, flu-like symptoms, shortness of breath, cough etc.) before then, please call [phone].  If you test positive for Covid 19 in the 2 weeks post procedure, please call and report this information to [place].    If any biopsies were taken you will be contacted by phone or by letter within the next 1-3 weeks.  Please call us at [phone] if you have not heard about the  biopsies in 3 weeks.    SIGNATURES/CONFIDENTIALITY: You and/or your care partner have signed paperwork which will be entered into your electronic medical record.  These signatures attest to the fact that that the information above on your After Visit Summary has been reviewed and is understood.  Full responsibility of the confidentiality of this discharge information lies with you and/or your care-partner.

## 2020-08-16 NOTE — Progress Notes (Signed)
1414 Robinul 0.1 mg IV given due large amount of secretions upon assessment.  MD made aware, vss  °

## 2020-08-16 NOTE — Op Note (Signed)
[facility] Patient Name: Kristen [name] Procedure Date: 08/16/2020 2:19 PM MRN: [id number] Endoscopist: Remo Lipps P. Havery Moros , MD Age: 80 Referring MD:  Date of Birth: [birth year] Gender: Female Account #: [id number] Procedure:                Upper GI endoscopy Indications:              Dysphagia Medicines:                Monitored Anesthesia Care Procedure:                Pre-Anesthesia Assessment:                           - Prior to the procedure, a History and Physical                            was performed, and patient medications and                            allergies were reviewed. The patient's tolerance of                            previous anesthesia was also reviewed. The risks                            and benefits of the procedure and the sedation                            options and risks were discussed with the patient.                            All questions were answered, and informed consent                            was obtained. Prior Anticoagulants: The patient has                            taken Plavix (clopidogrel), last dose was 5 days                            prior to procedure. ASA Grade Assessment: III - A                            patient with severe systemic disease. After                            reviewing the risks and benefits, the patient was                            deemed in satisfactory condition to undergo the                            procedure.  After obtaining informed consent, the endoscope was                            passed under direct vision. Throughout the                            procedure, the patient's blood pressure, pulse, and                            oxygen saturations were monitored continuously. The                            Endoscope was introduced through the mouth, and                            advanced to the second part of duodenum. The upper                             GI endoscopy was accomplished without difficulty.                            The patient tolerated the procedure well. Scope In: Scope Out: Findings:                 Esophagogastric landmarks were identified: the                            Z-line was found at 37 cm, the gastroesophageal                            junction was found at 37 cm and the upper extent of                            the gastric folds was found at 37 cm from the                            incisors.                           The exam of the esophagus was otherwise normal. No                            overt stricture / stenosis noted.                           A guidewire was placed and the scope was withdrawn.                            Empiric dilation was performed in the entire                            esophagus with a Savary dilator with mild  resistance at 17 mm. Relook endoscopy showed an                            appropriate mucosal wrent just inferior to the UES,                            site of likely mild stricture not appreciated                            endoscopically.                           Localized mild inflammation characterized by                            erythema and friability was found in the gastric                            antrum. Biopsies were taken with a cold forceps for                            histology.                           A few small sessile polyps were found in the                            gastric antrum. Biopsies were taken with a cold                            forceps for histology.                           The exam of the stomach was otherwise normal.                           Biopsies were taken with a cold forceps in the                            gastric body, at the incisura and in the gastric                            antrum for Helicobacter pylori testing.                           The duodenal bulb and second portion of  the                            duodenum were normal. Complications:            No immediate complications. Estimated blood loss:                            Minimal. Estimated Blood Loss:     Estimated blood loss was minimal. Impression:               -  Esophagogastric landmarks identified.                           - Normal esophagus otherwise - empiric dilation                            performed given dysphagia, appropriate mucosal                            wrent noted just inferior to the UES.                           - Gastritis. Biopsied.                           - A few gastric polyps. Biopsied.                           - Normal stomach otherwise                           - Normal duodenal bulb and second portion of the                            duodenum. Recommendation:           - Patient has a contact number available for                            emergencies. The signs and symptoms of potential                            delayed complications were discussed with the                            patient. Return to normal activities tomorrow.                            Written discharge instructions were provided to the                            patient.                           - Post dilation diet.                           - Continue present medications.                           - Resume Plavix tomorrow                           - Await pathology results. Remo Lipps P. Borna Wessinger, MD 08/16/2020 3:14:29 PM This report has been signed electronically.

## 2020-08-16 NOTE — Progress Notes (Signed)
CHECK-IN-AER  Vital Signs-CW

## 2020-08-16 NOTE — Progress Notes (Signed)
Called to room to assist during endoscopic procedure.  Patient ID and intended procedure confirmed with present staff. Received instructions for my participation in the procedure from the performing physician.  

## 2020-08-18 ENCOUNTER — Telehealth: Payer: Self-pay

## 2020-08-18 NOTE — Telephone Encounter (Signed)
  Follow up Call-  Call back number 08/16/2020  Post procedure Call Back phone  # [phone]  Permission to leave phone message Yes  Some recent data might be hidden     Patient questions:  Do you have a fever, pain , or abdominal swelling? No. Pain Score  0 *  Have you tolerated food without any problems? Yes.    Have you been able to return to your normal activities? Yes.    Do you have any questions about your discharge instructions: Diet   No. Medications  No. Follow up visit  No.  Do you have questions or concerns about your Care? No.  Actions: * If pain score is 4 or above: No action needed, pain <4.  1. Have you developed a fever since your procedure? no  2.   Have you had an respiratory symptoms (SOB or cough) since your procedure? no  3.   Have you tested positive for COVID 19 since your procedure no 4.   Have you had any family members/close contacts diagnosed with the COVID 19 since your procedure?  no   If yes to any of these questions please route to Joylene John, RN and Joella Prince, RN

## 2020-08-24 ENCOUNTER — Other Ambulatory Visit: Payer: Self-pay | Admitting: *Deleted

## 2020-08-24 NOTE — Patient Outreach (Signed)
[place] [facility]) Care Management  08/24/2020  Kristen [name] [birth year] [id number]  RN Health Coach received elephone call from patient.  Hipaa compliance verified.  Patient was requesting information on how to store insulin that needs to be refrigerated on an 18 hrs flight. RN discussed cooler bags that are designed for insulin travel that can be purchased. RN told patient where they can be purchased.   Patient had question about any of her medication duplicatation RN went over her medication list.  [place] Management [phone]

## 2020-09-03 ENCOUNTER — Telehealth: Payer: Self-pay

## 2020-09-03 DIAGNOSIS — E1165 Type 2 diabetes mellitus with hyperglycemia: Secondary | ICD-10-CM

## 2020-09-03 MED ORDER — ONETOUCH VERIO VI STRP: ORAL_STRIP | 7 refills | Status: DC

## 2020-09-03 NOTE — Telephone Encounter (Signed)
E-scribed refill to [facility].  

## 2020-09-03 NOTE — Telephone Encounter (Signed)
-----   Message from [place] sent at 09/03/2020 11:24 AM EDT ----- Regarding: Refill Patient needs refill on her One Touch Verio Test Strips. If appropriate please send to [facility].  Thank you,   Margaretmary Dys, [place] Pharmacy Assistant [phone]

## 2020-09-03 NOTE — Chronic Care Management (AMB) (Addendum)
Chronic Care Management Pharmacy Assistant   Name: Kristen [name]  MRN: [id number] DOB: [birth year]   Reason for Encounter: Medication Review- Medication Adherence and Delivery Coordination   Recent office visits:  None since last CCM contact  Recent consult visits:  08/16/20- Dr. [place] Cellar- Gastroenterology- Colonoscopy, Upper GI Endoscopy  Hospital visits:  03/15/20- ED --> Admission Hypertensive Urgency. Started hydralazine and stopped ibuprofen and colchicine.  Medications: Outpatient Encounter Medications as of 09/03/2020  Medication Sig Note   amLODipine (NORVASC) 5 MG tablet Take 1 tablet (5 mg total) by mouth daily.    atorvastatin (LIPITOR) 40 MG tablet Take 1 tablet (40 mg total) by mouth daily.    blood glucose meter kit and supplies KIT Dispense based on patient and insurance preference. Use up to four times daily as directed. (FOR ICD-9 250.00, 250.01).    Blood Glucose Monitoring Suppl (ONETOUCH VERIO) w/Device KIT Use to check blood sugar up to 4 times a day as directed    Cholecalciferol (VITAMIN D) 2000 units CAPS Take 1 capsule by mouth daily.    clopidogrel (PLAVIX) 75 MG tablet Take 1 tablet (75 mg total) by mouth daily.    COVID-19 mRNA Vac-TriS, Pfizer, SUSP injection USE AS DIRECTED    COVID-19 mRNA Vac-TriS, Pfizer, SUSP injection USE AS DIRECTED    glucose blood (ONETOUCH VERIO) test strip USE TO CHECK BLOOD SUGAR UP TO 4 TIMES DAILY AS DIRECTED    hydrALAZINE (APRESOLINE) 50 MG tablet Take 1 tablet (50 mg total) by mouth 2 (two) times daily.    Insulin Glargine (BASAGLAR KWIKPEN) 100 UNIT/ML Inject 18 Units into the skin daily. Gets through PAP    Insulin Pen Needle (B-D UF III MINI PEN NEEDLES) 31G X 5 MM MISC INJECT 1 EACH AS DIRECTED AT BEDTIME. USE AS DIRECTED TO INJECT INSULIN. DX CODE: E11.8, E11.65    Lancets (ONETOUCH ULTRASOFT) lancets Use to check blood sugar up to 4 times a day as directed    losartan-hydrochlorothiazide (HYZAAR)  100-12.5 MG tablet Take 1 tablet by mouth daily.    metoprolol succinate (TOPROL-XL) 25 MG 24 hr tablet TAKE 1 TABLET BY MOUTH EVERY DAY    nitroGLYCERIN (NITROSTAT) 0.4 MG SL tablet Place 0.4 mg under the tongue every 5 (five) minutes as needed for chest pain. 04/01/2020: Reports has not needed recently    nystatin cream (MYCOSTATIN) Apply 1 application topically 2 (two) times daily.    potassium chloride (KLOR-CON) 10 MEQ tablet Take 1 tablet (10 mEq total) by mouth every Monday, Wednesday, and Friday.    sitaGLIPtin (JANUVIA) 50 MG tablet Take 1 tablet (50 mg total) by mouth daily. (Patient taking differently: Take 50 mg by mouth daily. Gets through PAP)    Facility-Administered Encounter Medications as of 09/03/2020  Medication   0.9 %  sodium chloride infusion   BP Readings from Last 3 Encounters:  08/16/20 (!) 161/79  07/07/20 120/66  06/16/20 130/70    Lab Results  Component Value Date   HGBA1C 10.7 (H) 03/16/2020      Recent OV, Consult or Hospital visit:  08/16/20- Dr. [place] Cellar- Gastroenterology- Colonoscopy, Upper GI Endoscopy No medication changes indicated   Patient obtains medications through Adherence Packaging  90 Days   Last adherence delivery date: N/A First adherence delivery from [facility]   Patient is due for next adherence delivery on: 09/10/20  Spoke with patient on 09/03/20 and reviewed medications and coordinated delivery.  This delivery to include: Adherence Packaging  90 Days  Packs: Atorvastatin 40 mg 1 tablet daily (breakfast) Metoprolol Succinate 25 mg 1 tablet daily (breakfast) Hydralazine 50 mg 1 tablet twice daily (1-breakfast, 1-bedtime) Clopidogrel 75 mg 1 tablet daily (breakfast) Potassium chloride ER 10 Meq 1 tablet daily on Mon, Wed, Friday (Breakfast) Amlodipine 5 mg 1 tablet daily (breakfast) Losartan 100 mg-Hydrochlorothiazide 12.5 mg 1 tablet daily (breakfast)  PRN/VIAL medications: One touch delica plus lancets  59T One touch verio test strips  Patient declined the following medications this month: Januvia 50 mg 1 tablet daily (breakfast) - PAP Basaglar 18 units daily - PAP Nystatin 100,000 U- Uses PRN Pen needles use as directed once daily at bedtime- Has enough on hand  Refills requested from PCP include: One touch verio test strips  Confirmed delivery date of 09/10/20, advised patient that pharmacy will contact her the morning of delivery.  Recent blood pressure readings are as follows: No readings available  Recent blood glucose readings are as follows: Fasting: 130, 133, 127, 130  After Meals: 189, 190, 183, 180  Follow-Up:  Pharmacist Review  [name], CPP notified  Margaretmary Dys, [place] [phone]  I have reviewed the care management and care coordination activities outlined in this encounter and I am certifying that I agree with the content of this note. No further action required.  [name], PharmD Clinical Pharmacist [place] [facility] at [facility] [phone]

## 2020-09-07 ENCOUNTER — Ambulatory Visit: Payer: Medicare Other | Admitting: *Deleted

## 2020-09-14 ENCOUNTER — Ambulatory Visit (INDEPENDENT_AMBULATORY_CARE_PROVIDER_SITE_OTHER): Payer: Medicare Other

## 2020-09-14 ENCOUNTER — Other Ambulatory Visit: Payer: Self-pay | Admitting: *Deleted

## 2020-09-14 ENCOUNTER — Telehealth: Payer: Self-pay

## 2020-09-14 DIAGNOSIS — E1159 Type 2 diabetes mellitus with other circulatory complications: Secondary | ICD-10-CM

## 2020-09-14 DIAGNOSIS — Z79899 Other long term (current) drug therapy: Secondary | ICD-10-CM

## 2020-09-14 DIAGNOSIS — A048 Other specified bacterial intestinal infections: Secondary | ICD-10-CM

## 2020-09-14 DIAGNOSIS — E785 Hyperlipidemia, unspecified: Secondary | ICD-10-CM | POA: Diagnosis not present

## 2020-09-14 DIAGNOSIS — E1169 Type 2 diabetes mellitus with other specified complication: Secondary | ICD-10-CM | POA: Diagnosis not present

## 2020-09-14 MED ORDER — PYLERA 140-125-125 MG PO CAPS: 3.0000 | ORAL_CAPSULE | Freq: Three times a day (TID) | ORAL | 0 refills | Status: DC

## 2020-09-14 MED ORDER — PANTOPRAZOLE SODIUM 20 MG PO TBEC: 20.0000 mg | DELAYED_RELEASE_TABLET | Freq: Two times a day (BID) | ORAL | 0 refills | Status: DC

## 2020-09-14 NOTE — Addendum Note (Signed)
Addended by: Yevette Edwards on: 09/14/2020 03:38 PM   Modules accepted: Orders

## 2020-09-14 NOTE — Telephone Encounter (Signed)
Spoke with patient in regards to recommendations. Patient would like prescriptions sent to [facility]. Patient is aware that I will call her a month after treatment to remind her to come in to complete the stool study for H. Pylori to make sure that the bacteria has been eradicated. Patient verbalized understanding and had no concerns at the end of the call.  Lab order and reminder in epic.  Prescriptions sent to pharmacy on file.

## 2020-09-14 NOTE — Telephone Encounter (Signed)
It does not sound like a true allergy - GI upset is listed as the reaction to doxycycline.  I would try Pylera (which uses tetracycline) plus Protonix 20 mg twice daily for 10 days if she does not have a true allergy to doxycycline. Once done with therapy, about a month later, she will need to submit a stool study to check for H pylori eradication testing. Thanks

## 2020-09-14 NOTE — Telephone Encounter (Signed)
Patient returned call in regards to pathology results. Reviewed patient's results and recommendations. Patient states that she is not sure what reaction she has to the Doxycycline, she states that she can't say for sure if it is a true allergy or not unless she tries it again. Advised that I will have to check back with Dr. Havery Moros to see how he would like to proceed because due to her other listed allergies treatment is limited. Please advise, thanks.

## 2020-09-14 NOTE — Progress Notes (Signed)
Joaquim Lai Pleasant let me know patient had some concerns about her pill packaging.  Contacted patient to explain how pill packaging. She was concerned about which medications were in the packaging. It was confirmed by imaging that her packs had the correct medications. Ms. Deroy was sent a 4 DS of packaging with start date 09/15/20. She has a breakfast (6 meds) and evening pouch (1 med) daily and additional breakfast pouch three days per week with potassium chloride. Her Januvia remains separate as she gets this from PAP.   Breakfast Amlodipine 5 mg Atorvastatin 40 mg Clopidogrel 75 mg Hydralazine 50 mg Losartan-HCTZ 100/12.5 mg Metoprolol succinate 25 mg  Potassium chloride ER 10 mEq - M,W,F only  Evening Hydralazine 50 mg  Patient confirms understanding and denies further questions/concerns.  [name], PharmD Clinical Pharmacist [place] [facility] at [facility] [phone]

## 2020-09-14 NOTE — Patient Outreach (Signed)
[facility]) Care Management  09/14/2020  Kristen [name] 1940/08/28 [id number]   RN Health Coach received a telephone call to patient.  Hipaa compliance verified. [facility]. RN referrred information to [facility]ist.  [place] Management [phone]

## 2020-09-15 NOTE — Progress Notes (Signed)
Encounter details: CCM Time Spent       Value Time User   Time spent with patient (minutes)  15 09/14/2020  3:23 PM [name], [facility]   Time spent performing Chart review  15 09/14/2020  3:23 PM [name], [facility]   Total time (minutes)  30 09/14/2020  3:23 PM [name], RPH      Moderate to High Complex Decision Making       Value Time User   Moderate to High complex decision making  Yes 09/14/2020  3:23 PM [name], [facility]      CCM Services: This encounter meets complex CCM services and moderate to high decision making.  Prior to outreach and patient consent for Chronic Care Management, I referred this patient for services after reviewing the nominated patient list or from a personal encounter with the patient.  I have personally reviewed this encounter including the documentation in this note and have collaborated with the care management provider regarding care management and care coordination activities to include development and update of the comprehensive care plan. I am certifying that I agree with the content of this note and encounter as supervising physician.

## 2020-09-24 ENCOUNTER — Other Ambulatory Visit: Payer: Self-pay | Admitting: *Deleted

## 2020-09-24 ENCOUNTER — Telehealth: Payer: Self-pay

## 2020-09-24 ENCOUNTER — Telehealth: Payer: Medicare Other

## 2020-09-24 NOTE — Chronic Care Management (AMB) (Addendum)
Chronic Care Management Pharmacy Assistant   Name: Kristen [name]  MRN: [id number] DOB: 1940/05/30  Reason for Encounter: Disease State- Diabetes   Recent office visits:  None since last CCM Contact  Recent consult visits:  None since last [facility] visits:  None in previous 6 months  Medications: Outpatient Encounter Medications as of 09/24/2020  Medication Sig Note   amLODipine (NORVASC) 5 MG tablet Take 1 tablet (5 mg total) by mouth daily.    atorvastatin (LIPITOR) 40 MG tablet Take 1 tablet (40 mg total) by mouth daily.    bismuth-metronidazole-tetracycline (PYLERA) 140-125-125 MG capsule Take 3 capsules by mouth 4 (four) times daily -  before meals and at bedtime.    blood glucose meter kit and supplies KIT Dispense based on patient and insurance preference. Use up to four times daily as directed. (FOR ICD-9 250.00, 250.01).    Blood Glucose Monitoring Suppl (ONETOUCH VERIO) w/Device KIT Use to check blood sugar up to 4 times a day as directed    Cholecalciferol (VITAMIN D) 2000 units CAPS Take 1 capsule by mouth daily.    clopidogrel (PLAVIX) 75 MG tablet Take 1 tablet (75 mg total) by mouth daily.    COVID-19 mRNA Vac-TriS, Pfizer, SUSP injection USE AS DIRECTED    COVID-19 mRNA Vac-TriS, Pfizer, SUSP injection USE AS DIRECTED    glucose blood (ONETOUCH VERIO) test strip USE TO CHECK BLOOD SUGAR UP TO 4 TIMES DAILY AS DIRECTED    hydrALAZINE (APRESOLINE) 50 MG tablet Take 1 tablet (50 mg total) by mouth 2 (two) times daily.    Insulin Glargine (BASAGLAR KWIKPEN) 100 UNIT/ML Inject 18 Units into the skin daily. Gets through PAP    Insulin Pen Needle (B-D UF III MINI PEN NEEDLES) 31G X 5 MM MISC INJECT 1 EACH AS DIRECTED AT BEDTIME. USE AS DIRECTED TO INJECT INSULIN. DX CODE: E11.8, E11.65    Lancets (ONETOUCH ULTRASOFT) lancets Use to check blood sugar up to 4 times a day as directed    losartan-hydrochlorothiazide (HYZAAR) 100-12.5 MG tablet Take 1 tablet by  mouth daily.    metoprolol succinate (TOPROL-XL) 25 MG 24 hr tablet TAKE 1 TABLET BY MOUTH EVERY DAY    nitroGLYCERIN (NITROSTAT) 0.4 MG SL tablet Place 0.4 mg under the tongue every 5 (five) minutes as needed for chest pain. 04/01/2020: Reports has not needed recently    nystatin cream (MYCOSTATIN) Apply 1 application topically 2 (two) times daily.    pantoprazole (PROTONIX) 20 MG tablet Take 1 tablet (20 mg total) by mouth 2 (two) times daily for 10 days.    potassium chloride (KLOR-CON) 10 MEQ tablet Take 1 tablet (10 mEq total) by mouth every Monday, Wednesday, and Friday.    sitaGLIPtin (JANUVIA) 50 MG tablet Take 1 tablet (50 mg total) by mouth daily. (Patient taking differently: Take 50 mg by mouth daily. Gets through PAP)    Facility-Administered Encounter Medications as of 09/24/2020  Medication   0.9 %  sodium chloride infusion      Recent Relevant Labs: Lab Results  Component Value Date/Time   HGBA1C 10.7 (H) 03/16/2020 05:37 AM   HGBA1C 7.6 (A) 09/25/2019 10:07 AM   HGBA1C 8.2 (H) 03/24/2019 09:57 AM   MICROALBUR 7.8 (H) 08/09/2016 10:28 AM   MICROALBUR 2.6 (H) 12/08/2013 10:31 AM    Kidney Function Lab Results  Component Value Date/Time   CREATININE 1.00 04/07/2020 11:08 AM   CREATININE 1.29 (H) 03/30/2020 10:36 AM   CREATININE  1.34 (H) 03/10/2014 12:33 PM   CREATININE 0.97 07/02/2013 12:32 AM   GFR 53.90 (L) 04/07/2020 11:08 AM   GFRNONAA 41 (L) 03/19/2020 03:26 AM   GFRNONAA 41 (L) 03/10/2014 12:33 PM   GFRNONAA 59 (L) 07/02/2013 12:32 AM   GFRAA >60 02/20/2018 10:26 AM   GFRAA 50 (L) 03/10/2014 12:33 PM   GFRAA >60 07/02/2013 12:32 AM     Current antihyperglycemic regimen:  Sitagliptin 50 mg - 1 tablet daily   Basaglar - Inject 18 units daily at bedtime    Patient verbally confirms she is taking the above medications as directed. Yes  What recent interventions/DTPs have been made to improve glycemic control:  No recent changes or interventions  Have  there been any recent hospitalizations or ED visits since last visit with CPP? No  Patient denies hypoglycemic symptoms, including Pale, Sweaty, Shaky, Hungry, Nervous/irritable and Vision changes  Patient denies hyperglycemic symptoms, including blurry vision, excessive thirst, fatigue, polyuria and weakness  How often are you checking your blood sugar? twice daily  What are your blood sugars ranging?  Fasting: 120-140 After meals: Ranges around 190 after evening meal  On insulin? Yes How many units: Basaglar - Inject 18 units daily at bedtime  During the week, how often does your blood glucose drop below 70? Never  Are you checking your feet daily/regularly? Yes  Adherence Review: Is the patient currently on a STATIN medication? Yes Is the patient currently on ACE/ARB medication? Yes Does the patient have >5 day gap between last estimated fill dates? No  Star Rating Drugs:  Medication:   Last Fill: Day Supply Atorvastatin 40 mg  09/08/20 90 Losartan-HCTZ 100-12.5 mg  09/08/20 90 Januvia 50 mg   Patient Assistance  No appointments scheduled within the next 30 days.  Patient is doing well. States she is still adjusting to medication packages. She denies need for any medications at this time. Patient states she has appointment soon about her sugars and will be getting another A1C. Current A1c on file is from 02/2021 and was 10.7. She states her sugars have come down since then.  Follow-Up:  Pharmacist Review  [name], CPP notified  Margaretmary Dys, [place] Assistant [phone]  I have reviewed the care management and care coordination activities outlined in this encounter and I am certifying that I agree with the content of this note. No further action required.  [name], PharmD Clinical Pharmacist [place] [facility] at [facility] [phone]

## 2020-09-24 NOTE — Patient Outreach (Signed)
[facility]) Care Management  09/24/2020  Kristen [name] 1941-01-18 [id number]   RN Health Coachreceived telephone call from patient.  Hipaa compliance verified. Per patient she is taking pylera. She is taking with her meals and a little water or orange juice. Her son wanted her to call the RN Health coach to see if she is taking correctly.  RN explained that it is to be taken 4 times a day after meals and at bedtime with a full glass of H2O. RN instructed patient to avoid milk products and antacids until 3 days after dose stopped. RN used teach back to make sure patient understood.    [place] Care Management [phone]

## 2020-09-27 ENCOUNTER — Telehealth: Payer: Self-pay | Admitting: Family Medicine

## 2020-09-27 ENCOUNTER — Telehealth: Payer: Self-pay | Admitting: Gastroenterology

## 2020-09-27 NOTE — Telephone Encounter (Signed)
Spoke with pt asking what mobility impairment she has to need the parking placard.  Pt states she has to stop to catch her breath.  Also, due to knees weakening after bilateral knee surgeries.  Placed form in Dr. Synthia Innocent box.

## 2020-09-27 NOTE — Telephone Encounter (Signed)
Inbound call from pt stating she is having some after affects from the medicine Pylera. She wanted to know does she need to continue this medication. Please advise.

## 2020-09-27 NOTE — Telephone Encounter (Signed)
Pt came into office to drop off disability parking form. Placed in provider folder

## 2020-09-28 NOTE — Telephone Encounter (Signed)
Patient called and informed that her placard is ready for her to pick up from the front desk.

## 2020-09-28 NOTE — Telephone Encounter (Signed)
Spoke with patient, she states that yesterday her head was hurting and she had some dizziness. She states that she was spraying wasp nests on her porch and thinks that the spray may have caused her to feel this way. She states that she started the Pylera on 09/22/20 and had not had any symptoms until 2 days ago. She states that she took her medicine this morning and feels fine. Pt states that she only has about 3 days of the medication left, advised that she continue the medication and to call us back if she has any other concerns. Patient verbalized understanding and had no concerns at the end of the call.

## 2020-09-28 NOTE — Telephone Encounter (Signed)
Filled and in Lisa's box 

## 2020-10-18 ENCOUNTER — Other Ambulatory Visit: Payer: Self-pay | Admitting: *Deleted

## 2020-10-18 NOTE — Patient Outreach (Signed)
[facility]) Care Management  10/18/2020  Terressa Evola Eppolito 07-Jan-1941 [id number]  RN Health Coach received telephone call from patient.  [employer] compliance verified. Patient stated she is running out of her insulin pen. Rn discussed with patient to call the number on prescription to make them aware she has 5 left and to refill.  [facility] [phone]

## 2020-10-21 ENCOUNTER — Other Ambulatory Visit: Payer: Self-pay | Admitting: *Deleted

## 2020-10-21 NOTE — Patient Instructions (Signed)
Goals Addressed             This Visit's Progress    [facility] CM: Lifestyle Change-Hypertension       Timeframe:  Long-Range Goal Priority:  Medium Start Date:   16109604                          Expected End Date:    [id number]                  Follow Up Date [id number]   - agree to work together to make changes - ask questions to understand - learn about high blood pressure    Why is this important?   The changes that you are asked to make may be hard to do.  This is especially true when the changes are life-long.  Knowing why it is important to you is the first step.  Working on the change with your family or support person helps you not feel alone.  Reward yourself and family or support person when goals are met. This can be an activity you choose like bowling, hiking, biking, swimming or shooting hoops.     Notes:   04/08/20 and 04/21/20: -- please resume checking your blood pressure at home: be sure to write down the readings so we can review them next time we talk -- it is a good idea to get a dedicated notebook to write down your blood pressures at home -- take your blood pressures to your doctor appointments so your doctor can review- this will help your doctor know if you are on the right treatment plan and medications -- call your doctor if you are worried about your blood pressure being too high or too low -- continue following a heart healthy and low salt diet  05/04/20: -- [place] job keeping track of your blood pressures at home: keep doing this several times per week and be sure to write them down so you can share with your doctor at your next appointment -- the blood pressures we reviewed today were in very good range -- keep following a low salt diet      COMPLETED: [facility] CM: Maintain My Quality of Life       Timeframe:  Long-Range Goal Priority:  Medium Start Date: [id number]                            Expected End Date:   [id number]                   Follow Up Date  [id number]   - discuss my treatment options with the doctor or nurse    Why is this important?   Having a long-term illness can be scary.  It can also be stressful for you and your caregiver.  These steps may help.    Notes:   04/08/20: -- Be sure to contact your care team (doctors and nurses) if you have questions or feel confused about what you should be doing to stay healthy and on track      [facility] CM: Monitor and Manage My Blood Sugar   On track    Timeframe:  Long-Range Goal Priority:  High Start Date:     01027253                        Expected End Date:  82641583                 Follow Up Date [id number] - check blood sugar at prescribed times - check blood sugar if I feel it is too high or too low - enter blood sugar readings and medication or insulin into daily log - take the blood sugar log to all doctor visits - take the blood sugar meter to all doctor visits    Why is this important?   Checking your blood sugar at home helps to keep it from getting very high or very low.  Writing the results in a diary or log helps the doctor know how to care for you.  Your blood sugar log should have the time, date and the results.  Also, write down the amount of insulin or other medicine that you take.  Other information, like what you ate, exercise done and how you were feeling, will also be helpful.     Notes:   04/08/20 and 04/21/20: -- I am glad you received printed educational material mailed to you on 04/01/20: please read and review regularly; let me know if you have questions -- check your blood sugars twice a day: fasting (on an empty stomach in the morning when you wake up) and 2-hours after eating: this will help your doctor know if you are on the right medicines for Diabetes -- take your blood sugar list from home to your doctor appointments with you -- we will review your blood sugars at home when I call you on the phone on May 05, 2019-- if you are doing well, I  will transfer you back to the [place] that you were working with before your recent hospital visit  05/04/20: -- Doristine Devoid job resuming taking your blood sugars at home -- the blood sugars we reviewed today look much better- you are making good progress in lowering your blood sugars: keep up the great work! [id number] patient is monitoring blood sugars. RN reiterated checking the blood sugars AC.      [facility] CM: Obtain Eye Exam   On track    Timeframe:  Long-Range Goal Priority:  Medium Start Date:  [id number]                           Expected End Date:  24462863                   Follow Up Date [id number]   - schedule appointment with eye doctor    Why is this important?   Eye check-ups are important when you have diabetes.  Vision loss can be prevented.    Notes:   04/01/20: Goal placed on hold based on current patient needs [id number] Last eye exam [id number]  04/08/20 and 04/21/20: -- As we discussed today, Amela, please schedule an appointment to get an updated eye and vision exam: Diabetes can lead to eye and vision problems; you should have a yearly eye exam if you are diabetic  05/04/20: -- As we discussed today, Inez Catalina, please schedule an appointment to get an updated eye and vision exam: Diabetes can lead to eye and vision problems; you should have a yearly eye exam if you are diabetic      [facility] CM: Set My Target A1C   Not on track    Timeframe:  Long-Range Goal Priority:  High Start Date:      60600459  Expected End Date:    97989211         Follow Up Date [id number]   - set target A1C    Why is this important?   Your target A1C is decided together by you and your doctor.  It is based on several things like your age and other health issues.    Notes:   04/01/20: -- ask your doctor what your target/ goal A1-C should be  04/08/20 and 04/21/20: -- Inez Catalina, it is very important that you check your blood sugars at home so you know how they are  running when you have your next A1-C -- Your last A1-C was 10.7 on March 16, 2020; you doctor wants your A1-C to be 7.0 or less; keeping track of your blood sugars will help you stay on track to reduce your A1-C and get it back under control -- Be sure to contact your doctor if your blood sugars start to get too high or too low, or if you think you are having trouble with your blood sugars -- Please review the information attached about signs/ symptoms of low and high blood sugar; if you experience these signs and symptoms, follow the action plan and let your doctor know  05/04/20: -- please consider making you next appointment with your primary care doctor, for the spring of 2022- your next A1-C is due in March or early April -- your blood sugars are showing good improvement at home: you should see a decrease in your A1-C if your blood sugars remain good at home -- with each visit to your primary care doctor, ask him about your A1-C goal and how he feels about your medications and your progress over time in achieving this goal -- most primary care doctor's want their patient's to have an A1-C between 6.0- 7.0, which equals a blood sugar at home between 120 and 150  06/08/2020 A1C is 10.7 [id number] A1C 10.7 next meeting her goal      [facility] CM: Track and Manage My Blood Pressure-Hypertension   On track       Timeframe:  Long-Range Goal Priority:  High Start Date:      [id number]                  Expected End Date:   78588502                               Follow Up Date [id number]   - check blood pressure 3 times per week - write blood pressure results in a log or diary    Why is this important?   You won't feel high blood pressure, but it can still hurt your blood vessels.  High blood pressure can cause heart or kidney problems. It can also cause a stroke.  Making lifestyle changes like losing a little weight or eating less salt will help.  Checking your blood pressure at home and at  different times of the day can help to control blood pressure.  If the doctor prescribes medicine remember to take it the way the doctor ordered.  Call the office if you cannot afford the medicine or if there are questions about it.     Notes:   04/08/20 and 04/21/20: -- please resume checking your blood pressure at home several times each week: be sure to write down the readings so we can review them next time we talk -- it  is a good idea to get a dedicated notebook to write down your blood pressures at home -- take your blood pressures to your doctor appointments so your doctor can review- this will help your doctor know if you are on the right treatment plan and medications -- call your doctor if you are worried about your blood pressure being too high or too low  04/01/20: Keep up the good work monitoring your blood pressure at home: take your Blood pressure readings form home to each doctor appointment- this will help your doctor make sure that you are on the right medication and doing everything you should be 05/04/20: -- [place] job keeping track of your blood pressures at home: keep doing this several times per week and be sure to write them down so you can share with your doctor at your next appointment -- the blood pressures we reviewed today were in very good range -- keep following a low salt diet  [id number] patient is monitoring her blood pressure

## 2020-10-21 NOTE — Patient Outreach (Signed)
[facility]) Care Management  [place]  10/21/2020   Kristen [name] [birth year] [id number]  RN Health Coach telephone call to patient.  Hip[employer] compliance verified. Per patient she is doing good. She is monitoring blood sugars and blood pressure. Patient blood sugar is 167 after eating. Per patient she has not started an exercise routine. Per patient she is having some pain in her foot on side of great toe. Patient is following up on health maintenance. Patient has agreed to follow up outreach calls.   Encounter Medications:  Outpatient Encounter Medications as of 10/21/2020  Medication Sig Note   amLODipine (NORVASC) 5 MG tablet Take 1 tablet (5 mg total) by mouth daily.    atorvastatin (LIPITOR) 40 MG tablet Take 1 tablet (40 mg total) by mouth daily.    bismuth-metronidazole-tetracycline (PYLERA) 140-125-125 MG capsule Take 3 capsules by mouth 4 (four) times daily -  before meals and at bedtime.    blood glucose meter kit and supplies KIT Dispense based on patient and insurance preference. Use up to four times daily as directed. (FOR ICD-9 250.00, 250.01).    Blood Glucose Monitoring Suppl (ONETOUCH VERIO) w/Device KIT Use to check blood sugar up to 4 times a day as directed    Cholecalciferol (VITAMIN D) 2000 units CAPS Take 1 capsule by mouth daily.    clopidogrel (PLAVIX) 75 MG tablet Take 1 tablet (75 mg total) by mouth daily.    COVID-19 mRNA Vac-TriS, Pfizer, SUSP injection USE AS DIRECTED    COVID-19 mRNA Vac-TriS, Pfizer, SUSP injection USE AS DIRECTED    glucose blood (ONETOUCH VERIO) test strip USE TO CHECK BLOOD SUGAR UP TO 4 TIMES DAILY AS DIRECTED    hydrALAZINE (APRESOLINE) 50 MG tablet Take 1 tablet (50 mg total) by mouth 2 (two) times daily.    Insulin Glargine (BASAGLAR KWIKPEN) 100 UNIT/ML Inject 18 Units into the skin daily. Gets through PAP    Insulin Pen Needle (B-D UF III MINI PEN NEEDLES) 31G X 5 MM MISC INJECT 1 EACH AS DIRECTED AT  BEDTIME. USE AS DIRECTED TO INJECT INSULIN. DX CODE: E11.8, E11.65    Lancets (ONETOUCH ULTRASOFT) lancets Use to check blood sugar up to 4 times a day as directed    losartan-hydrochlorothiazide (HYZAAR) 100-12.5 MG tablet Take 1 tablet by mouth daily.    metoprolol succinate (TOPROL-XL) 25 MG 24 hr tablet TAKE 1 TABLET BY MOUTH EVERY DAY    nitroGLYCERIN (NITROSTAT) 0.4 MG SL tablet Place 0.4 mg under the tongue every 5 (five) minutes as needed for chest pain. 04/01/2020: Reports has not needed recently    nystatin cream (MYCOSTATIN) Apply 1 application topically 2 (two) times daily.    pantoprazole (PROTONIX) 20 MG tablet Take 1 tablet (20 mg total) by mouth 2 (two) times daily for 10 days.    potassium chloride (KLOR-CON) 10 MEQ tablet Take 1 tablet (10 mEq total) by mouth every Monday, Wednesday, and Friday.    sitaGLIPtin (JANUVIA) 50 MG tablet Take 1 tablet (50 mg total) by mouth daily. (Patient taking differently: Take 50 mg by mouth daily. Gets through PAP)    Facility-Administered Encounter Medications as of 10/21/2020  Medication   0.9 %  sodium chloride infusion    Functional Status:  In your present state of health, do you have any difficulty performing the following activities: 03/31/2020  Hearing? N  Vision? N  Difficulty concentrating or making decisions? N  Walking or climbing stairs? N  Dressing or  bathing? N  Doing errands, shopping? N  Preparing Food and eating ? N  Using the Toilet? N  In the past six months, have you accidently leaked urine? N  Do you have problems with loss of bowel control? N  Managing your Medications? N  Managing your Finances? N  Housekeeping or managing your Housekeeping? N  Some recent data might be hidden    Fall/Depression Screening: Fall Risk  04/08/2020 03/31/2020 02/02/2020  Falls in the past year? 0 0 1  Comment - - -  Number falls in past yr: 0 0 1  Comment Denies recent falls - -  Injury with Fall? 0 0 0  Comment N/A- no falls  reported - -  Risk Factor Category  - - -  Risk for fall due to : Medication side effect Medication side effect History of fall(s);Impaired balance/gait;Impaired mobility  Risk for fall due to: Comment - - -  Follow up Falls prevention discussed Falls evaluation completed;Falls prevention discussed Falls evaluation completed;Falls prevention discussed   PHQ 2/9 Scores 04/08/2020 03/31/2020 03/14/2019 03/03/2019 09/27/2018 07/29/2018 05/27/2018  PHQ - 2 Score 0 0 0 0 0 0 0  PHQ- 9 Score - 0 0 - - - -    Assessment:   Care Plan There are no care plans that you recently modified to display for this patient.    Goals Addressed             This Visit's Progress    THN CM: Lifestyle Change-Hypertension       Timeframe:  Long-Range Goal Priority:  Medium Start Date:   [id number]                          Expected End Date:    [id number]                  Follow Up Date [id number]   - agree to work together to make changes - ask questions to understand - learn about high blood pressure    Why is this important?   The changes that you are asked to make may be hard to do.  This is especially true when the changes are life-long.  Knowing why it is important to you is the first step.  Working on the change with your family or support person helps you not feel alone.  Reward yourself and family or support person when goals are met. This can be an activity you choose like bowling, hiking, biking, swimming or shooting hoops.     Notes:   04/08/20 and 04/21/20: -- please resume checking your blood pressure at home: be sure to write down the readings so we can review them next time we talk -- it is a good idea to get a dedicated notebook to write down your blood pressures at home -- take your blood pressures to your doctor appointments so your doctor can review- this will help your doctor know if you are on the right treatment plan and medications -- call your doctor if you are worried about your  blood pressure being too high or too low -- continue following a heart healthy and low salt diet  05/04/20: -- Kristen [name] job keeping track of your blood pressures at home: keep doing this several times per week and be sure to write them down so you can share with your doctor at your next appointment -- the blood pressures we reviewed today were in very good  range -- keep following a low salt diet      COMPLETED: THN CM: Maintain My Quality of Life       Timeframe:  Long-Range Goal Priority:  Medium Start Date: [id number]                            Expected End Date:   67591638                   Follow Up Date [id number]   - discuss my treatment options with the doctor or nurse    Why is this important?   Having a long-term illness can be scary.  It can also be stressful for you and your caregiver.  These steps may help.    Notes:   04/08/20: -- Be sure to contact your care team (doctors and nurses) if you have questions or feel confused about what you should be doing to stay healthy and on track      [facility] CM: Monitor and Manage My Blood Sugar   On track    Timeframe:  Long-Range Goal Priority:  High Start Date:     [id number]                        Expected End Date:     03009233                 Follow Up Date [id number] - check blood sugar at prescribed times - check blood sugar if I feel it is too high or too low - enter blood sugar readings and medication or insulin into daily log - take the blood sugar log to all doctor visits - take the blood sugar meter to all doctor visits    Why is this important?   Checking your blood sugar at home helps to keep it from getting very high or very low.  Writing the results in a diary or log helps the doctor know how to care for you.  Your blood sugar log should have the time, date and the results.  Also, write down the amount of insulin or other medicine that you take.  Other information, like what you ate, exercise done and how you were  feeling, will also be helpful.     Notes:   04/08/20 and 04/21/20: -- I am glad you received printed educational material mailed to you on 04/01/20: please read and review regularly; let me know if you have questions -- check your blood sugars twice a day: fasting (on an empty stomach in the morning when you wake up) and 2-hours after eating: this will help your doctor know if you are on the right medicines for Diabetes -- take your blood sugar list from home to your doctor appointments with you -- we will review your blood sugars at home when I call you on the phone on May 05, 2019-- if you are doing well, I will transfer you back to the [place] that you were working with before your recent hospital visit  05/04/20: -- Doristine Devoid job resuming taking your blood sugars at home -- the blood sugars we reviewed today look much better- you are making good progress in lowering your blood sugars: keep up the great work! [id number] patient is monitoring blood sugars. RN reiterated checking the blood sugars AC.      THN CM: Obtain Eye Exam   On track  Timeframe:  Long-Range Goal Priority:  Medium Start Date:  [id number]                           Expected End Date:  63875643                   Follow Up Date [id number]   - schedule appointment with eye doctor    Why is this important?   Eye check-ups are important when you have diabetes.  Vision loss can be prevented.    Notes:   04/01/20: Goal placed on hold based on current patient needs [id number] Last eye exam [id number]  04/08/20 and 04/21/20: -- As we discussed today, Kristen [name], please schedule an appointment to get an updated eye and vision exam: Diabetes can lead to eye and vision problems; you should have a yearly eye exam if you are diabetic  05/04/20: -- As we discussed today, Kristen [name], please schedule an appointment to get an updated eye and vision exam: Diabetes can lead to eye and vision problems; you should have a yearly eye exam  if you are diabetic      [facility] CM: Set My Target A1C   Not on track    Timeframe:  Long-Range Goal Priority:  High Start Date:      55732202                       Expected End Date:    54270623         Follow Up Date 76283151   - set target A1C    Why is this important?   Your target A1C is decided together by you and your doctor.  It is based on several things like your age and other health issues.    Notes:   04/01/20: -- ask your doctor what your target/ goal A1-C should be  04/08/20 and 04/21/20: -- Kristen [name], it is very important that you check your blood sugars at home so you know how they are running when you have your next A1-C -- Your last A1-C was 10.7 on March 16, 2020; you doctor wants your A1-C to be 7.0 or less; keeping track of your blood sugars will help you stay on track to reduce your A1-C and get it back under control -- Be sure to contact your doctor if your blood sugars start to get too high or too low, or if you think you are having trouble with your blood sugars -- Please review the information attached about signs/ symptoms of low and high blood sugar; if you experience these signs and symptoms, follow the action plan and let your doctor know  05/04/20: -- please consider making you next appointment with your primary care doctor, for the spring of 2022- your next A1-C is due in March or early April -- your blood sugars are showing good improvement at home: you should see a decrease in your A1-C if your blood sugars remain good at home -- with each visit to your primary care doctor, ask him about your A1-C goal and how he feels about your medications and your progress over time in achieving this goal -- most primary care doctor's want their patient's to have an A1-C between 6.0- 7.0, which equals a blood sugar at home between 120 and 150  06/08/2020 A1C is 10.7 [id number] A1C 10.7 next meeting her goal      [facility] CM: Track and Manage My  Blood Pressure-Hypertension    On track       Timeframe:  Long-Range Goal Priority:  High Start Date:      63016010                  Expected End Date:   93235573                               Follow Up Date [id number]   - check blood pressure 3 times per week - write blood pressure results in a log or diary    Why is this important?   You won't feel high blood pressure, but it can still hurt your blood vessels.  High blood pressure can cause heart or kidney problems. It can also cause a stroke.  Making lifestyle changes like losing a little weight or eating less salt will help.  Checking your blood pressure at home and at different times of the day can help to control blood pressure.  If the doctor prescribes medicine remember to take it the way the doctor ordered.  Call the office if you cannot afford the medicine or if there are questions about it.     Notes:   04/08/20 and 04/21/20: -- please resume checking your blood pressure at home several times each week: be sure to write down the readings so we can review them next time we talk -- it is a good idea to get a dedicated notebook to write down your blood pressures at home -- take your blood pressures to your doctor appointments so your doctor can review- this will help your doctor know if you are on the right treatment plan and medications -- call your doctor if you are worried about your blood pressure being too high or too low  04/01/20: Keep up the good work monitoring your blood pressure at home: take your Blood pressure readings form home to each doctor appointment- this will help your doctor make sure that you are on the right medication and doing everything you should be 05/04/20: -- Kristen [name] job keeping track of your blood pressures at home: keep doing this several times per week and be sure to write them down so you can share with your doctor at your next appointment -- the blood pressures we reviewed today were in very good range -- keep following a low  salt diet  [id number] patient is monitoring her blood pressure         Plan:  Follow-up: Patient agrees to Care Plan and Follow-up. Patient will schedule  mammogram Patient will schedule foot exam RN will follow up within the month of September RN sent update assessment to PCP  [place] [facility] [phone]

## 2020-10-26 ENCOUNTER — Telehealth: Payer: Self-pay

## 2020-10-26 NOTE — Telephone Encounter (Signed)
Spoke with patient to remind her that she is due for H. Pylori stool study at this time. No appointment is necessary. Patient is aware that she can stop by the lab in the basement at her convenience between 7:30 AM - 5 PM, Monday through Friday. Patient states that she is feeling way better after antibiotic treatment, she states that she feels like she has a new stomach. She states that she will have to come in next Monday or Tuesday when her ride comes back in town. Patient verbalized understanding and had no concerns at the end of the call.

## 2020-10-26 NOTE — Telephone Encounter (Signed)
-----   Message from Yevette Edwards, RN sent at 09/14/2020  3:33 PM EDT ----- Regarding: Lab H. Pylori stool antigen, due on or after 10/26/20.

## 2020-11-01 ENCOUNTER — Other Ambulatory Visit: Payer: Medicare Other

## 2020-11-02 ENCOUNTER — Telehealth: Payer: Self-pay

## 2020-11-02 ENCOUNTER — Other Ambulatory Visit: Payer: Medicare Other

## 2020-11-02 DIAGNOSIS — A048 Other specified bacterial intestinal infections: Secondary | ICD-10-CM | POA: Diagnosis not present

## 2020-11-02 NOTE — Telephone Encounter (Signed)
Spoke with patient to remind her to bring in stool sample this week. She states that she will be bringing the kit back today.

## 2020-11-02 NOTE — Telephone Encounter (Signed)
-----   Message from Yevette Edwards, RN sent at 10/26/2020  1:24 PM EDT ----- Regarding: FW: Lab  ----- Message ----- From: Yevette Edwards, RN Sent: 10/26/2020  12:00 AM EDT To: Yevette Edwards, RN Subject: Lab                                            H. Pylori stool antigen, due on or after 10/26/20.

## 2020-11-04 LAB — H. PYLORI ANTIGEN, STOOL: H pylori Ag, Stl: NEGATIVE

## 2020-11-30 ENCOUNTER — Other Ambulatory Visit: Payer: Self-pay | Admitting: Family Medicine

## 2020-11-30 ENCOUNTER — Other Ambulatory Visit: Payer: Self-pay | Admitting: *Deleted

## 2020-11-30 DIAGNOSIS — Z1231 Encounter for screening mammogram for malignant neoplasm of breast: Secondary | ICD-10-CM

## 2020-11-30 NOTE — Patient Outreach (Signed)
[facility] [facility]) Care Management  11/30/2020  Kristen [name] Rish [birth year] [id number]   RN Health Coach telephone received call from patient.  Hip[employer] compliance verified.Patient was wanted to update her insurance. RN instructed her to call and make aware that she is wanting her plan updated.  Plan: Patient will call insurance plan and make aware of wanting to update her coverage.  [place] [facility] Management [phone]

## 2020-12-02 ENCOUNTER — Telehealth: Payer: Self-pay

## 2020-12-02 NOTE — Chronic Care Management (AMB) (Addendum)
Chronic Care Management Pharmacy Assistant   Name: Kristen [name]  MRN: [id number] DOB: 1940-08-31   Reason for Encounter: Medication Adherence and Delivery Coordination    Recent office visits:  None since last CCM contact  Recent consult visits:  08/16/20 - Gastroenterology - Patient presented for colonoscopy and endoscopy. No medication changes  Hospital visits:  None in previous 6 months  Medications: Outpatient Encounter Medications as of 12/02/2020  Medication Sig Note   amLODipine (NORVASC) 5 MG tablet Take 1 tablet (5 mg total) by mouth daily.    atorvastatin (LIPITOR) 40 MG tablet Take 1 tablet (40 mg total) by mouth daily.    bismuth-metronidazole-tetracycline (PYLERA) 140-125-125 MG capsule Take 3 capsules by mouth 4 (four) times daily -  before meals and at bedtime.    blood glucose meter kit and supplies KIT Dispense based on patient and insurance preference. Use up to four times daily as directed. (FOR ICD-9 250.00, 250.01).    Blood Glucose Monitoring Suppl (ONETOUCH VERIO) w/Device KIT Use to check blood sugar up to 4 times a day as directed    Cholecalciferol (VITAMIN D) 2000 units CAPS Take 1 capsule by mouth daily.    clopidogrel (PLAVIX) 75 MG tablet Take 1 tablet (75 mg total) by mouth daily.    COVID-19 mRNA Vac-TriS, Pfizer, SUSP injection USE AS DIRECTED    COVID-19 mRNA Vac-TriS, Pfizer, SUSP injection USE AS DIRECTED    glucose blood (ONETOUCH VERIO) test strip USE TO CHECK BLOOD SUGAR UP TO 4 TIMES DAILY AS DIRECTED    hydrALAZINE (APRESOLINE) 50 MG tablet Take 1 tablet (50 mg total) by mouth 2 (two) times daily.    Insulin Glargine (BASAGLAR KWIKPEN) 100 UNIT/ML Inject 18 Units into the skin daily. Gets through PAP    Insulin Pen Needle (B-D UF III MINI PEN NEEDLES) 31G X 5 MM MISC INJECT 1 EACH AS DIRECTED AT BEDTIME. USE AS DIRECTED TO INJECT INSULIN. DX CODE: E11.8, E11.65    Lancets (ONETOUCH ULTRASOFT) lancets Use to check blood sugar up to 4  times a day as directed    losartan-hydrochlorothiazide (HYZAAR) 100-12.5 MG tablet Take 1 tablet by mouth daily.    metoprolol succinate (TOPROL-XL) 25 MG 24 hr tablet TAKE 1 TABLET BY MOUTH EVERY DAY    nitroGLYCERIN (NITROSTAT) 0.4 MG SL tablet Place 0.4 mg under the tongue every 5 (five) minutes as needed for chest pain. 04/01/2020: Reports has not needed recently    nystatin cream (MYCOSTATIN) Apply 1 application topically 2 (two) times daily.    pantoprazole (PROTONIX) 20 MG tablet Take 1 tablet (20 mg total) by mouth 2 (two) times daily for 10 days.    potassium chloride (KLOR-CON) 10 MEQ tablet Take 1 tablet (10 mEq total) by mouth every Monday, Wednesday, and Friday.    sitaGLIPtin (JANUVIA) 50 MG tablet Take 1 tablet (50 mg total) by mouth daily. (Patient taking differently: Take 50 mg by mouth daily. Gets through PAP)    Facility-Administered Encounter Medications as of 12/02/2020  Medication   0.9 %  sodium chloride infusion    BP Readings from Last 3 Encounters:  08/16/20 (!) 161/79  07/07/20 120/66  06/16/20 130/70    Lab Results  Component Value Date   HGBA1C 10.7 (H) 03/16/2020     Last adherence delivery date: 09/10/2020   Patient is due for next adherence delivery on: 12/13/2020  Spoke with patient on 12/02/2020 reviewed medications and coordinated delivery.  This delivery to include: Adherence  Packaging  90 Days  Packs: Atorvastatin 40 mg 1 tablet daily (breakfast) Metoprolol Succinate 25 mg 1 tablet daily (breakfast) Hydralazine 50 mg 1 tablet twice daily (1-breakfast, 1-bedtime) Clopidogrel 75 mg 1 tablet daily (breakfast) Potassium chloride ER 10 Meq 1 tablet daily on Mon, Wed, Friday (Breakfast) Amlodipine 5 mg 1 tablet daily (breakfast) Losartan 100 mg-Hydrochlorothiazide 12.5 mg 1 tablet daily (breakfast) VIAL medications: One touch delica plus lancets 97O One touch verio test strips Pen needles use as directed once daily at bedtime  Patient declined  the following medications this month: Januvia 50 mg 1 tablet daily (breakfast) - PAP Basaglar 18 units daily - PAP Nystatin 100,000 U- Uses PRN  Any concerns about your medications? No  How often do you forget or accidentally miss a dose? Rarely  Do you use a pillbox? No  Is patient in packaging Yes  If yes  What is the date on your next pill pack? 12/01/20  Any concerns or issues with your packaging? No the patient reports she likes the packs  Refills from PCP needed: losartan/HCTZ   Confirmed delivery date of 12/13/2020, advised patient that pharmacy will contact them the morning of delivery.  Patient reports her blood sugars fasting are running in the 100's -120's and  160's after eating meals.  Patient reports her blood pressure is running 126/69. She only checks when feeling bad   Kristen [name], CPP notified  Kristen [name], [place] Assistant [phone]  I have reviewed the care management and care coordination activities outlined in this encounter and I am certifying that I agree with the content of this note. No further action needed.  Kristen [name], PharmD Clinical Pharmacist [facility] [phone]

## 2020-12-08 ENCOUNTER — Telehealth: Payer: Self-pay

## 2020-12-08 DIAGNOSIS — E1165 Type 2 diabetes mellitus with hyperglycemia: Secondary | ICD-10-CM

## 2020-12-08 DIAGNOSIS — IMO0002 Reserved for concepts with insufficient information to code with codable children: Secondary | ICD-10-CM

## 2020-12-08 MED ORDER — BD PEN NEEDLE MINI U/F 31G X 5 MM MISC: 3 refills | Status: DC

## 2021-01-05 ENCOUNTER — Other Ambulatory Visit: Payer: Self-pay

## 2021-01-05 ENCOUNTER — Encounter: Payer: Self-pay | Admitting: Family Medicine

## 2021-01-05 ENCOUNTER — Ambulatory Visit (INDEPENDENT_AMBULATORY_CARE_PROVIDER_SITE_OTHER): Payer: Medicare Other | Admitting: Family Medicine

## 2021-01-05 VITALS — BP 136/68 | HR 62 | Temp 97.4°F | Ht 61.0 in | Wt 192.1 lb

## 2021-01-05 DIAGNOSIS — E1159 Type 2 diabetes mellitus with other circulatory complications: Secondary | ICD-10-CM | POA: Diagnosis not present

## 2021-01-05 DIAGNOSIS — E118 Type 2 diabetes mellitus with unspecified complications: Secondary | ICD-10-CM

## 2021-01-05 DIAGNOSIS — N1831 Chronic kidney disease, stage 3a: Secondary | ICD-10-CM

## 2021-01-05 DIAGNOSIS — R5383 Other fatigue: Secondary | ICD-10-CM

## 2021-01-05 DIAGNOSIS — IMO0002 Reserved for concepts with insufficient information to code with codable children: Secondary | ICD-10-CM

## 2021-01-05 DIAGNOSIS — E039 Hypothyroidism, unspecified: Secondary | ICD-10-CM

## 2021-01-05 DIAGNOSIS — I10 Essential (primary) hypertension: Secondary | ICD-10-CM | POA: Diagnosis not present

## 2021-01-05 DIAGNOSIS — E559 Vitamin D deficiency, unspecified: Secondary | ICD-10-CM | POA: Diagnosis not present

## 2021-01-05 DIAGNOSIS — Z79899 Other long term (current) drug therapy: Secondary | ICD-10-CM | POA: Diagnosis not present

## 2021-01-05 DIAGNOSIS — E1165 Type 2 diabetes mellitus with hyperglycemia: Secondary | ICD-10-CM | POA: Diagnosis not present

## 2021-01-05 DIAGNOSIS — I700 Atherosclerosis of aorta: Secondary | ICD-10-CM | POA: Diagnosis not present

## 2021-01-05 LAB — BASIC METABOLIC PANEL
BUN: 27 mg/dL — ABNORMAL HIGH (ref 6–23)
CO2: 30 mEq/L (ref 19–32)
Calcium: 9.6 mg/dL (ref 8.4–10.5)
Chloride: 102 mEq/L (ref 96–112)
Creatinine, Ser: 1.13 mg/dL (ref 0.40–1.20)
GFR: 46.3 mL/min — ABNORMAL LOW (ref >60.00)
Glucose, Bld: 162 mg/dL — ABNORMAL HIGH (ref 70–99)
Potassium: 3.8 mEq/L (ref 3.5–5.1)
Sodium: 142 mEq/L (ref 135–145)

## 2021-01-05 LAB — POCT GLYCOSYLATED HEMOGLOBIN (HGB A1C): Hemoglobin A1C: 8 % — AB (ref 4.0–5.6)

## 2021-01-05 LAB — CBC WITH DIFFERENTIAL/PLATELET
Basophils Absolute: 0 10*3/uL (ref 0.0–0.1)
Basophils Relative: 0.4 % (ref 0.0–3.0)
Eosinophils Absolute: 0.1 10*3/uL (ref 0.0–0.7)
Eosinophils Relative: 0.6 % (ref 0.0–5.0)
HCT: 35.2 % — ABNORMAL LOW (ref 36.0–46.0)
Hemoglobin: 11.1 g/dL — ABNORMAL LOW (ref 12.0–15.0)
Lymphocytes Relative: 24.6 % (ref 12.0–46.0)
Lymphs Abs: 2.1 10*3/uL (ref 0.7–4.0)
MCHC: 31.4 g/dL (ref 30.0–36.0)
MCV: 87.2 fl (ref 78.0–100.0)
Monocytes Absolute: 0.5 10*3/uL (ref 0.1–1.0)
Monocytes Relative: 6 % (ref 3.0–12.0)
Neutro Abs: 5.7 10*3/uL (ref 1.4–7.7)
Neutrophils Relative %: 68.4 % (ref 43.0–77.0)
Platelets: 247 10*3/uL (ref 150.0–400.0)
RBC: 4.04 Mil/uL (ref 3.87–5.11)
RDW: 15.3 % (ref 11.5–15.5)
WBC: 8.4 10*3/uL (ref 4.0–10.5)

## 2021-01-05 LAB — T4, FREE: Free T4: 0.9 ng/dL (ref 0.60–1.60)

## 2021-01-05 LAB — TSH: TSH: 3.71 u[IU]/mL (ref 0.35–5.50)

## 2021-01-05 LAB — VITAMIN B12: Vitamin B-12: 373 pg/mL (ref 211–911)

## 2021-01-05 MED ORDER — METOPROLOL SUCCINATE ER 25 MG PO TB24: 12.5000 mg | ORAL_TABLET | Freq: Every day | ORAL | 3 refills | Status: DC

## 2021-01-05 NOTE — Assessment & Plan Note (Signed)
?  fatigue to medication - will trial lower toprol XL dose (12.'5mg'$  daily).

## 2021-01-05 NOTE — Patient Instructions (Addendum)
Labs today  We will set you up with diabetic eye exam in our office over the next few months.  The metoprolol (Toprol XL) may be causing fatigue - cut to 1/2 tablet daily (12.'5mg'$ ) - I will send new dose to your pharmacy.  Ok to start B12 vitamin.  Schedule physical in 3-4 months.  If interested, check with pharmacy about new 2 shot shingles series (shingrix).

## 2021-01-05 NOTE — Assessment & Plan Note (Signed)
Chronic, significant improvement with regular basaglar use. It doesn't seem she's taking [place]. Foot exam today. I will see if we can schedule her for a diabetic eye exam at our clinic.

## 2021-01-05 NOTE — Progress Notes (Signed)
Patient ID: Kristen [name], female    DOB: [birth year], 80 y.o.   MRN: [id number]  This visit was conducted in person.  BP 136/68   Pulse 62   Temp (!) 97.4 F (36.3 C) (Temporal)   Ht '5\' 1"'  (1.549 m)   Wt 192 lb 1 oz (87.1 kg)   SpO2 98%   BMI 36.29 kg/m    CC: DM f/u visit  Subjective:   HPI: Kristen [name] is a 80 y.o. female presenting on 01/05/2021 for Diabetes (Here for f/u.)   Son brought her here today.   DM - does regularly check sugars a few times a week: 120 fasting, 160-180 at night. Compliant with antihyperglycemic regimen which includes: basaglar 18 daily (through PAP). She's not been taking januvia 74m (previously through PAP). Denies low sugars or hypoglycemic symptoms. Denies paresthesias, blurry vision. Last diabetic eye exam 04/2019 DUE. Glucometer brand: [employer] Last foot exam: 09/2019 - DUE. DSME: declined. Lab Results  Component Value Date   HGBA1C 8.0 (A) 01/05/2021   Diabetic Foot Exam - Simple   Simple Foot Form Diabetic Foot exam was performed with the following findings: Yes 01/05/2021 11:41 AM  Visual Inspection No deformities, no ulcerations, no other skin breakdown bilaterally: Yes Sensation Testing Intact to touch and monofilament testing bilaterally: Yes Pulse Check Posterior Tibialis and Dorsalis pulse intact bilaterally: Yes Comments 1+ DP bilaterally    Lab Results  Component Value Date   MICROALBUR 7.8 (H) 08/09/2016     HTN - bp controlled on current regimen of hydralazine 537mbid, losartan hctz 100/12.66m63maily, amlodipine 66mg31mily.   She feels something in her medications is making her feel run down and drained. Unsure which medicine is causing this.   She has scheduled podiatry appt and mammogram at end of this month.  Brings pill pack from [facility] which was reviewed - does not contain Januvia.      Relevant past medical, surgical, family and social history reviewed and updated as indicated. Interim  medical history since our last visit reviewed. Allergies and medications reviewed and updated. Outpatient Medications Prior to Visit  Medication Sig Dispense Refill   amLODipine (NORVASC) 5 MG tablet Take 1 tablet (5 mg total) by mouth daily. 90 tablet 3   atorvastatin (LIPITOR) 40 MG tablet Take 1 tablet (40 mg total) by mouth daily. 90 tablet 3   Blood Glucose Monitoring Suppl (ONETOUCH VERIO) w/Device KIT Use to check blood sugar up to 4 times a day as directed 1 kit 0   Cholecalciferol (VITAMIN D) 2000 units CAPS Take 1 capsule by mouth daily.     clopidogrel (PLAVIX) 75 MG tablet Take 1 tablet (75 mg total) by mouth daily. 90 tablet 3   glucose blood (ONETOUCH VERIO) test strip USE TO CHECK BLOOD SUGAR UP TO 4 TIMES DAILY AS DIRECTED 100 each 7   hydrALAZINE (APRESOLINE) 50 MG tablet Take 1 tablet (50 mg total) by mouth 2 (two) times daily. 180 tablet 3   hydrochlorothiazide (HYDRODIURIL) 12.5 MG tablet Take 12.5 mg by mouth every morning.     Insulin Glargine (BASAGLAR KWIKPEN) 100 UNIT/ML Inject 18 Units into the skin daily. Gets through PAP     Insulin Pen Needle (B-D UF III MINI PEN NEEDLES) 31G X 5 MM MISC Use once daily 100 each 3   Lancets (ONETOUCH ULTRASOFT) lancets Use to check blood sugar up to 4 times a day as directed 100 each 11   losartan (  COZAAR) 100 MG tablet Take 100 mg by mouth every morning.     nitroGLYCERIN (NITROSTAT) 0.4 MG SL tablet Place 0.4 mg under the tongue every 5 (five) minutes as needed for chest pain.     nystatin cream (MYCOSTATIN) Apply 1 application topically 2 (two) times daily. (Patient taking differently: Apply 1 application topically 2 (two) times daily. As needed) 30 g 0   potassium chloride (KLOR-CON) 10 MEQ tablet Take 1 tablet (10 mEq total) by mouth every Monday, Wednesday, and Friday. 40 tablet 3   metoprolol succinate (TOPROL-XL) 25 MG 24 hr tablet TAKE 1 TABLET BY MOUTH EVERY DAY 90 tablet 3   bismuth-metronidazole-tetracycline (PYLERA)  140-125-125 MG capsule Take 3 capsules by mouth 4 (four) times daily -  before meals and at bedtime. 120 capsule 0   blood glucose meter kit and supplies KIT Dispense based on patient and insurance preference. Use up to four times daily as directed. (FOR ICD-9 250.00, 250.01). 1 each 0   COVID-19 mRNA Vac-TriS, Pfizer, SUSP injection USE AS DIRECTED .3 mL 0   COVID-19 mRNA Vac-TriS, Pfizer, SUSP injection USE AS DIRECTED .3 mL 0   losartan-hydrochlorothiazide (HYZAAR) 100-12.5 MG tablet Take 1 tablet by mouth daily. (Patient not taking: Reported on 01/05/2021) 90 tablet 3   pantoprazole (PROTONIX) 20 MG tablet Take 1 tablet (20 mg total) by mouth 2 (two) times daily for 10 days. 20 tablet 0   sitaGLIPtin (JANUVIA) 50 MG tablet Take 1 tablet (50 mg total) by mouth daily. (Patient taking differently: Take 50 mg by mouth daily. Gets through PAP) 90 tablet 3   Facility-Administered Medications Prior to Visit  Medication Dose Route Frequency Provider Last Rate Last Admin   0.9 %  sodium chloride infusion  500 mL Intravenous Once Armbruster, Carlota Raspberry, MD         Per HPI unless specifically indicated in ROS section below Review of Systems  Objective:  BP 136/68   Pulse 62   Temp (!) 97.4 F (36.3 C) (Temporal)   Ht '5\' 1"'  (1.549 m)   Wt 192 lb 1 oz (87.1 kg)   SpO2 98%   BMI 36.29 kg/m   Wt Readings from Last 3 Encounters:  01/05/21 192 lb 1 oz (87.1 kg)  08/16/20 192 lb (87.1 kg)  07/07/20 192 lb 2 oz (87.1 kg)      Physical Exam Vitals and nursing note reviewed.  Constitutional:      Appearance: Normal appearance. She is not ill-appearing.  Eyes:     Extraocular Movements: Extraocular movements intact.     Conjunctiva/sclera: Conjunctivae normal.     Pupils: Pupils are equal, round, and reactive to light.  Cardiovascular:     Rate and Rhythm: Normal rate and regular rhythm.     Pulses: Normal pulses.     Heart sounds: Normal heart sounds. No murmur heard. Pulmonary:     Effort:  Pulmonary effort is normal. No respiratory distress.     Breath sounds: Normal breath sounds. No wheezing, rhonchi or rales.  Musculoskeletal:     Right lower leg: No edema.     Left lower leg: No edema.  Skin:    General: Skin is warm and dry.     Findings: No rash.  Neurological:     Mental Status: She is alert.  Psychiatric:        Mood and Affect: Mood normal.        Behavior: Behavior normal.      Results for  orders placed or performed in visit on 01/05/21  POCT glycosylated hemoglobin (Hb A1C)  Result Value Ref Range   Hemoglobin A1C 8.0 (A) 4.0 - 5.6 %   HbA1c POC (<> result, manual entry)     HbA1c, POC (prediabetic range)     HbA1c, POC (controlled diabetic range)      Assessment & Plan:  This visit occurred during the SARS-CoV-2 public health emergency.  Safety protocols were in place, including screening questions prior to the visit, additional usage of staff PPE, and extensive cleaning of exam room while observing appropriate contact time as indicated for disinfecting solutions.   Problem List Items Addressed This Visit     Resistant hypertension    BP stable on current regimen.  However notes fatigue with medications.  Only recent change is splitting hyzaar into individual components as far as I can tell.  Will trial lower toprol XL dose to start, check labwork today.       Relevant Medications   hydrochlorothiazide (HYDRODIURIL) 12.5 MG tablet   losartan (COZAAR) 100 MG tablet   metoprolol succinate (TOPROL-XL) 25 MG 24 hr tablet   Severe obesity (BMI 35.0-39.9) with comorbidity ([place])   Diabetes mellitus type 2, uncontrolled, with complications ([place]) - Primary    Chronic, significant improvement with regular basaglar use. It doesn't seem she's taking [place]. Foot exam today. I will see if we can schedule her for a diabetic eye exam at our clinic.       Relevant Medications   losartan (COZAAR) 100 MG tablet   Other Relevant Orders   POCT glycosylated  hemoglobin (Hb A1C) (Completed)   Aortic atherosclerosis (HCC)    Continue plavix, statin.       Relevant Medications   hydrochlorothiazide (HYDRODIURIL) 12.5 MG tablet   losartan (COZAAR) 100 MG tablet   metoprolol succinate (TOPROL-XL) 25 MG 24 hr tablet   Vitamin D deficiency    Update levels - not currently taking replacement.       Borderline hypothyroidism    Update TSH, free T4.       Relevant Medications   metoprolol succinate (TOPROL-XL) 25 MG 24 hr tablet   Other Relevant Orders   T4, free   Polypharmacy    ?fatigue to medication - will trial lower toprol XL dose (12.41m daily).       Type 2 diabetes mellitus with vascular disease (HCC)   Relevant Medications   hydrochlorothiazide (HYDRODIURIL) 12.5 MG tablet   losartan (COZAAR) 100 MG tablet   metoprolol succinate (TOPROL-XL) 25 MG 24 hr tablet   Chronic kidney disease, stage 3a (HCC)   Fatigue   Relevant Orders   Vitamin B12   CBC with Differential/Platelet   Basic metabolic panel   TSH     Meds ordered this encounter  Medications   metoprolol succinate (TOPROL-XL) 25 MG 24 hr tablet    Sig: Take 0.5 tablets (12.5 mg total) by mouth daily.    Dispense:  45 tablet    Refill:  3    Orders Placed This Encounter  Procedures   Vitamin B12   CBC with Differential/Platelet   Basic metabolic panel   TSH   T4, free   POCT glycosylated hemoglobin (Hb A1C)     Patient Instructions  Labs today  We will set you up with diabetic eye exam in our office over the next few months.  The metoprolol (Toprol XL) may be causing fatigue - cut to 1/2 tablet daily (12.523m - I will  send new dose to your pharmacy.  Ok to start B12 vitamin.  Schedule physical in 3-4 months.  If interested, check with pharmacy about new 2 shot shingles series (shingrix).   Follow up plan: Return in about 3 months (around 04/06/2021) for annual exam, prior fasting for blood work.  [name], MD

## 2021-01-05 NOTE — Assessment & Plan Note (Addendum)
Update levels - not currently taking replacement.

## 2021-01-05 NOTE — Assessment & Plan Note (Signed)
BP stable on current regimen.  However notes fatigue with medications.  Only recent change is splitting hyzaar into individual components as far as I can tell.  Will trial lower toprol XL dose to start, check labwork today.

## 2021-01-05 NOTE — Assessment & Plan Note (Signed)
Continue plavix, statin.  

## 2021-01-05 NOTE — Assessment & Plan Note (Addendum)
Update TSH, free T4.

## 2021-01-10 ENCOUNTER — Encounter: Payer: Self-pay | Admitting: Family Medicine

## 2021-01-10 ENCOUNTER — Other Ambulatory Visit: Payer: Self-pay | Admitting: Family Medicine

## 2021-01-10 DIAGNOSIS — E538 Deficiency of other specified B group vitamins: Secondary | ICD-10-CM | POA: Insufficient documentation

## 2021-01-10 MED ORDER — VITAMIN B-12 1000 MCG PO TABS: 1000.0000 ug | ORAL_TABLET | Freq: Every day | ORAL | Status: DC

## 2021-01-12 ENCOUNTER — Telehealth: Payer: Self-pay

## 2021-01-12 LAB — HM DIABETES EYE EXAM

## 2021-01-12 NOTE — Progress Notes (Addendum)
Chronic Care Management Pharmacy Assistant   Name: Kristen [name]  MRN: [id number] DOB: 1941/03/07  Reason for Encounter: Diabetes Disease State   Recent office visits:  01/05/2021 - [name], MD - Patient presented for diabetes follow up. Changed: trial lower Toprol XL dose (12.59m daily). Start: Vitamin B-12 1000 mcg tablet. Follow up in 3 months for AWV.   Recent consult visits:  None since last CCM contact  Hospital visits:  None in previous 6 months  Medications: Outpatient Encounter Medications as of 01/12/2021  Medication Sig Note   amLODipine (NORVASC) 5 MG tablet Take 1 tablet (5 mg total) by mouth daily.    atorvastatin (LIPITOR) 40 MG tablet Take 1 tablet (40 mg total) by mouth daily.    bismuth-metronidazole-tetracycline (PYLERA) 140-125-125 MG capsule Take 3 capsules by mouth 4 (four) times daily -  before meals and at bedtime.    Blood Glucose Monitoring Suppl (ONETOUCH VERIO) w/Device KIT Use to check blood sugar up to 4 times a day as directed    Cholecalciferol (VITAMIN D) 2000 units CAPS Take 1 capsule by mouth daily.    clopidogrel (PLAVIX) 75 MG tablet Take 1 tablet (75 mg total) by mouth daily.    glucose blood (ONETOUCH VERIO) test strip USE TO CHECK BLOOD SUGAR UP TO 4 TIMES DAILY AS DIRECTED    hydrALAZINE (APRESOLINE) 50 MG tablet Take 1 tablet (50 mg total) by mouth 2 (two) times daily.    hydrochlorothiazide (HYDRODIURIL) 12.5 MG tablet Take 12.5 mg by mouth every morning.    Insulin Glargine (BASAGLAR KWIKPEN) 100 UNIT/ML Inject 18 Units into the skin daily. Gets through PAP    Insulin Pen Needle (B-D UF III MINI PEN NEEDLES) 31G X 5 MM MISC Use once daily    Lancets (ONETOUCH ULTRASOFT) lancets Use to check blood sugar up to 4 times a day as directed    losartan (COZAAR) 100 MG tablet Take 100 mg by mouth every morning.    metoprolol succinate (TOPROL-XL) 25 MG 24 hr tablet Take 0.5 tablets (12.5 mg total) by mouth daily.    nitroGLYCERIN  (NITROSTAT) 0.4 MG SL tablet Place 0.4 mg under the tongue every 5 (five) minutes as needed for chest pain. 04/01/2020: Reports has not needed recently    nystatin cream (MYCOSTATIN) Apply 1 application topically 2 (two) times daily. (Patient taking differently: Apply 1 application topically 2 (two) times daily. As needed)    potassium chloride (KLOR-CON) 10 MEQ tablet Take 1 tablet (10 mEq total) by mouth every Monday, Wednesday, and Friday.    vitamin B-12 (CYANOCOBALAMIN) 1000 MCG tablet Take 1 tablet (1,000 mcg total) by mouth daily.    Facility-Administered Encounter Medications as of 01/12/2021  Medication   0.9 %  sodium chloride infusion   Recent Relevant Labs: Lab Results  Component Value Date/Time   HGBA1C 8.0 (A) 01/05/2021 11:15 AM   HGBA1C 10.7 (H) 03/16/2020 05:37 AM   HGBA1C 7.6 (A) 09/25/2019 10:07 AM   HGBA1C 8.2 (H) 03/24/2019 09:57 AM   MICROALBUR 7.8 (H) 08/09/2016 10:28 AM   MICROALBUR 2.6 (H) 12/08/2013 10:31 AM    Kidney Function Lab Results  Component Value Date/Time   CREATININE 1.13 01/05/2021 11:45 AM   CREATININE 1.00 04/07/2020 11:08 AM   CREATININE 1.34 (H) 03/10/2014 12:33 PM   CREATININE 0.97 07/02/2013 12:32 AM   GFR 46.30 (L) 01/05/2021 11:45 AM   GFRNONAA 41 (L) 03/19/2020 03:26 AM   GFRNONAA 41 (L) 03/10/2014 12:33 PM  GFRNONAA 59 (L) 07/02/2013 12:32 AM   GFRAA >60 02/20/2018 10:26 AM   GFRAA 50 (L) 03/10/2014 12:33 PM   GFRAA >60 07/02/2013 12:32 AM   Contacted patient on 01/13/2021 to discuss diabetes disease state.   Current antihyperglycemic regimen:  Sitagliptin/Januvia 50 mg - 1 tablet daily through PAP Basaglar - Inject 18 units daily at bedtime   Patient verbally confirms she is taking the above medications as directed. No  What diet changes have been made to improve diabetes control? Patient states she does not eat food she used to eat. She wants off medication really bad so she has been trying to eat better.   What recent  interventions/DTPs have been made to improve glycemic control:  None noted  Have there been any recent hospitalizations or ED visits since last visit with CPP? No  Patient denies hypoglycemic symptoms, including Pale, Sweaty, Shaky, Hungry, Nervous/irritable, and Vision changes  Patient denies hyperglycemic symptoms, including blurry vision, excessive thirst, fatigue, polyuria, and weakness  How often are you checking your blood sugar? Patient stated 2-3 times a week before she eats and at night. Patient does not record it.  What are your blood sugars ranging? Patient states she does not write it down. Patient states it is around 110-119 and in the evening it is around 170.  During the week, how often does your blood glucose drop below 70? Never  Are you checking your feet daily/regularly? Yes  Adherence Review: Is the patient currently on a STATIN medication? Yes Is the patient currently on ACE/ARB medication? Yes Does the patient have >5 day gap between last estimated fill dates? No  Care Gaps: Annual wellness visit in last year? No 12/11/2013 Most recent A1C reading:  8.0 on 01/05/2021 Most Recent BP reading: 136/68 on 01/05/2021  Last eye exam / retinopathy screening: January 2021 Last diabetic foot exam: 01/05/2021  Counseled patient on importance of annual eye and foot exam.   Star Rating Drugs:  Medication:  Last Fill: [facility] Losartan 12m 12/07/2020 90 Atorvastatin 450m08/16/2022 90  Upcoming appts: 01/20/2021 - Mammogram 01/20/2021 - Podiatry  MiDebbora DusCPP notified  AmMarijean NiemannRMBucknerssistant [phone] Time Spent: [id number] I have reviewed the care management and care coordination activities outlined in this encounter and I am certifying that I agree with the content of this note. No further action required.  MiDebbora DusPharmD Clinical Pharmacist [phone]

## 2021-01-13 ENCOUNTER — Other Ambulatory Visit: Payer: Self-pay | Admitting: *Deleted

## 2021-01-13 NOTE — Patient Instructions (Signed)
Goals Addressed             This Visit's Progress    [facility] CM: Lifestyle Change-Hypertension   On track    Timeframe:  Long-Range Goal Priority:  Medium Start Date:   [id number]                          Expected End Date:    [id number]        Follow Up Date [id number]   - agree to work together to make changes - ask questions to understand - learn about high blood pressure    Why is this important?   The changes that you are asked to make may be hard to do.  This is especially true when the changes are life-long.  Knowing why it is important to you is the first step.  Working on the change with your family or support person helps you not feel alone.  Reward yourself and family or support person when goals are met. This can be an activity you choose like bowling, hiking, biking, swimming or shooting hoops.     Notes:   04/08/20 and 04/21/20: -- please resume checking your blood pressure at home: be sure to write down the readings so we can review them next time we talk -- it is a good idea to get a dedicated notebook to write down your blood pressures at home -- take your blood pressures to your doctor appointments so your doctor can review- this will help your doctor know if you are on the right treatment plan and medications -- call your doctor if you are worried about your blood pressure being too high or too low -- continue following a heart healthy and low salt diet  05/04/20: -- [place] job keeping track of your blood pressures at home: keep doing this several times per week and be sure to write them down so you can share with your doctor at your next appointment -- the blood pressures we reviewed today were in very good range -- keep following a low salt diet [id number] Patient is eating low sodium foods     [facility] CM: Monitor and Manage My Blood Sugar   On track    Timeframe:  Long-Range Goal Priority:  High Start Date:     [id number]                        Expected End Date:      [id number]                 Follow Up Date [id number] - check blood sugar at prescribed times - check blood sugar if I feel it is too high or too low - enter blood sugar readings and medication or insulin into daily log - take the blood sugar log to all doctor visits - take the blood sugar meter to all doctor visits    Why is this important?   Checking your blood sugar at home helps to keep it from getting very high or very low.  Writing the results in a diary or log helps the doctor know how to care for you.  Your blood sugar log should have the time, date and the results.  Also, write down the amount of insulin or other medicine that you take.  Other information, like what you ate, exercise done and how you were feeling, will also be helpful.  Notes:   04/08/20 and 04/21/20: -- I am glad you received printed educational material mailed to you on 04/01/20: please read and review regularly; let me know if you have questions -- check your blood sugars twice a day: fasting (on an empty stomach in the morning when you wake up) and 2-hours after eating: this will help your doctor know if you are on the right medicines for Diabetes -- take your blood sugar list from home to your doctor appointments with you -- we will review your blood sugars at home when I call you on the phone on May 05, 2019-- if you are doing well, I will transfer you back to the [place] that you were working with before your recent hospital visit  05/04/20: -- [employer] job resuming taking your blood sugars at home -- the blood sugars we reviewed today look much better- you are making good progress in lowering your blood sugars: keep up the great work! [id number] patient is monitoring blood sugars. RN reiterated checking the blood sugars AC. [id number] Patient is monitoring blood sugars but not daily     [facility] CM: Obtain Eye Exam   On track    Timeframe:  Long-Range Goal Priority:  Medium Start Date:  [id number]                            Expected End Date:  83382505          Follow Up Date [id number]   - schedule appointment with eye doctor    Why is this important?   Eye check-ups are important when you have diabetes.  Vision loss can be prevented.    Notes:   04/01/20: Goal placed on hold based on current patient needs [id number] Last eye exam [id number]  04/08/20 and 04/21/20: -- As we discussed today, Kristen [name], please schedule an appointment to get an updated eye and vision exam: Diabetes can lead to eye and vision problems; you should have a yearly eye exam if you are diabetic  05/04/20: -- As we discussed today, Kristen [name], please schedule an appointment to get an updated eye and vision exam: Diabetes can lead to eye and vision problems; you should have a yearly eye exam if you are diabetic  [id number] Patient had an eye exam [id number]     [facility] CM: Set My Target A1C   On track    Timeframe:  Long-Range Goal Priority:  High Start Date:      [id number]                       Expected End Date:    [id number]  Follow Up Date [id number]   - set target A1C    Why is this important?   Your target A1C is decided together by you and your doctor.  It is based on several things like your age and other health issues.    Notes:   04/01/20: -- ask your doctor what your target/ goal A1-C should be  04/08/20 and 04/21/20: -- Kristen [name], it is very important that you check your blood sugars at home so you know how they are running when you have your next A1-C -- Your last A1-C was 10.7 on March 16, 2020; you doctor wants your A1-C to be 7.0 or less; keeping track of your blood sugars will help you stay on track to reduce your A1-C and get it back under control --  Be sure to contact your doctor if your blood sugars start to get too high or too low, or if you think you are having trouble with your blood sugars -- Please review the information attached about signs/ symptoms of low and high blood sugar; if you experience  these signs and symptoms, follow the action plan and let your doctor know  05/04/20: -- please consider making you next appointment with your primary care doctor, for the spring of 2022- your next A1-C is due in March or early April -- your blood sugars are showing good improvement at home: you should see a decrease in your A1-C if your blood sugars remain good at home -- with each visit to your primary care doctor, ask him about your A1-C goal and how he feels about your medications and your progress over time in achieving this goal -- most primary care doctor's want their patient's to have an A1-C between 6.0- 7.0, which equals a blood sugar at home between 120 and 150  06/08/2020 A1C is 10.7 [id number] A1C 10.7 next meeting her goal [id number] A1C has decreased from 10.7 to 8.0     [facility] CM: Track and Manage My Blood Pressure-Hypertension   On track       Timeframe:  Long-Range Goal Priority:  High Start Date:      [id number]                  Expected End Date:   82993716                         Follow Up Date [id number]   - check blood pressure 3 times per week - write blood pressure results in a log or diary    Why is this important?   You won't feel high blood pressure, but it can still hurt your blood vessels.  High blood pressure can cause heart or kidney problems. It can also cause a stroke.  Making lifestyle changes like losing a little weight or eating less salt will help.  Checking your blood pressure at home and at different times of the day can help to control blood pressure.  If the doctor prescribes medicine remember to take it the way the doctor ordered.  Call the office if you cannot afford the medicine or if there are questions about it.     Notes:   04/08/20 and 04/21/20: -- please resume checking your blood pressure at home several times each week: be sure to write down the readings so we can review them next time we talk -- it is a good idea to get a dedicated notebook  to write down your blood pressures at home -- take your blood pressures to your doctor appointments so your doctor can review- this will help your doctor know if you are on the right treatment plan and medications -- call your doctor if you are worried about your blood pressure being too high or too low  04/01/20: Keep up the good work monitoring your blood pressure at home: take your Blood pressure readings form home to each doctor appointment- this will help your doctor make sure that you are on the right medication and doing everything you should be 05/04/20: -- [employer] [place] job keeping track of your blood pressures at home: keep doing this several times per week and be sure to write them down so you can share with your doctor at your next appointment -- the blood pressures  we reviewed today were in very good range -- keep following a low salt diet  [id number] patient is monitoring her blood pressure [id number] Patient is monitoring blood pressure at intervals

## 2021-01-13 NOTE — Patient Outreach (Signed)
[facility]) Care Management  [place]  01/13/2021   Kristen [name] Dec 12, 1940 [id number]  RN Health Coach telephone call to patient.  [employer] compliance verified. Per patient her A1C is 8.0 . Her A1C has decreased from 10.7. Patient is trying to eat healthier. She has not been monitoring her blood sugars daily. Patient stated she will not  be getting her COVID boosters or flu shot.Patient stated she has not been exercising. Patient had eye exam on [id number]. Patient last blood pressure chek was 136/68 and controlled. Patient has agreed to further outreach calls.  Encounter Medications:  Outpatient Encounter Medications as of 01/13/2021  Medication Sig Note   amLODipine (NORVASC) 5 MG tablet Take 1 tablet (5 mg total) by mouth daily.    atorvastatin (LIPITOR) 40 MG tablet Take 1 tablet (40 mg total) by mouth daily.    bismuth-metronidazole-tetracycline (PYLERA) 140-125-125 MG capsule Take 3 capsules by mouth 4 (four) times daily -  before meals and at bedtime.    Blood Glucose Monitoring Suppl (ONETOUCH VERIO) w/Device KIT Use to check blood sugar up to 4 times a day as directed    Cholecalciferol (VITAMIN D) 2000 units CAPS Take 1 capsule by mouth daily.    clopidogrel (PLAVIX) 75 MG tablet Take 1 tablet (75 mg total) by mouth daily.    glucose blood (ONETOUCH VERIO) test strip USE TO CHECK BLOOD SUGAR UP TO 4 TIMES DAILY AS DIRECTED    hydrALAZINE (APRESOLINE) 50 MG tablet Take 1 tablet (50 mg total) by mouth 2 (two) times daily.    hydrochlorothiazide (HYDRODIURIL) 12.5 MG tablet Take 12.5 mg by mouth every morning.    Insulin Glargine (BASAGLAR KWIKPEN) 100 UNIT/ML Inject 18 Units into the skin daily. Gets through PAP    Insulin Pen Needle (B-D UF III MINI PEN NEEDLES) 31G X 5 MM MISC Use once daily    Lancets (ONETOUCH ULTRASOFT) lancets Use to check blood sugar up to 4 times a day as directed    losartan (COZAAR) 100 MG tablet Take 100 mg by mouth every  morning.    metoprolol succinate (TOPROL-XL) 25 MG 24 hr tablet Take 0.5 tablets (12.5 mg total) by mouth daily.    nitroGLYCERIN (NITROSTAT) 0.4 MG SL tablet Place 0.4 mg under the tongue every 5 (five) minutes as needed for chest pain. 04/01/2020: Reports has not needed recently    nystatin cream (MYCOSTATIN) Apply 1 application topically 2 (two) times daily. (Patient taking differently: Apply 1 application topically 2 (two) times daily. As needed)    potassium chloride (KLOR-CON) 10 MEQ tablet Take 1 tablet (10 mEq total) by mouth every Monday, Wednesday, and Friday.    vitamin B-12 (CYANOCOBALAMIN) 1000 MCG tablet Take 1 tablet (1,000 mcg total) by mouth daily.    Facility-Administered Encounter Medications as of 01/13/2021  Medication   0.9 %  sodium chloride infusion    Functional Status:  In your present state of health, do you have any difficulty performing the following activities: 03/31/2020  Hearing? N  Vision? N  Difficulty concentrating or making decisions? N  Walking or climbing stairs? N  Dressing or bathing? N  Doing errands, shopping? N  Preparing Food and eating ? N  Using the Toilet? N  In the past six months, have you accidently leaked urine? N  Do you have problems with loss of bowel control? N  Managing your Medications? N  Managing your Finances? N  Housekeeping or managing your Housekeeping? N  Some  recent data might be hidden    Fall/Depression Screening: Fall Risk  04/08/2020 03/31/2020 02/02/2020  Falls in the past year? 0 0 1  Comment - - -  Number falls in past yr: 0 0 1  Comment Denies recent falls - -  Injury with Fall? 0 0 0  Comment N/A- no falls reported - -  Risk Factor Category  - - -  Risk for fall due to : Medication side effect Medication side effect History of fall(s);Impaired balance/gait;Impaired mobility  Risk for fall due to: Comment - - -  Follow up Falls prevention discussed Falls evaluation completed;Falls prevention discussed Falls  evaluation completed;Falls prevention discussed   PHQ 2/9 Scores 04/08/2020 03/31/2020 03/14/2019 03/03/2019 09/27/2018 07/29/2018 05/27/2018  PHQ - 2 Score 0 0 0 0 0 0 0  PHQ- 9 Score - 0 0 - - - -    Assessment:   Care Plan Care Plan : Diabetes Type 2 (Adult)  Updates made by Verlin Grills, RN since 01/13/2021 12:00 AM     Problem: Glycemic Management (Diabetes, Type 2)   Priority: Medium  Onset Date: 04/01/2020     Long-Range Goal: Glycemic Management Optimized   Start Date: 04/01/2020  Expected End Date: 07/21/2021  This Visit's Progress: On track  Recent Progress: Not on track  Priority: High  Note:   Evidence-based guidance:  Anticipate A1C testing (point-of-care) every 3 to 6 months based on goal attainment.  Review mutually-set A1C goal or target range.  Anticipate use of antihyperglycemic with or without insulin and periodic adjustments; consider active involvement of pharmacist.  Provide medical nutrition therapy and development of individualized eating.  Compare self-reported symptoms of hypo or hyperglycemia to blood glucose levels, diet and fluid intake, current medications, psychosocial and physiologic stressors, change in activity and barriers to care adherence.  [id number] A1C is 8.0 down from 10.7 Promote self-monitoring of blood glucose levels.  Assess and address barriers to management plan, such as food insecurity, age, developmental ability, depression, anxiety, fear of hypoglycemia or weight gain, as well as medication cost, side effects and complicated regimen.  Consider referral to community-based diabetes education program, visiting nurse, community health worker or health coach.  Encourage regular dental care for treatment of periodontal disease; refer to dental provider when needed.   Notes:   04/08/20: -- Confirmed patient received printed educational material mailed to her 04/01/20: encouraged her to read and review regularly; patient will need ongoing  reinforcement -- reinforced need to monitor blood pressures and blood sugars at home as advised: BP daily; blood sugars fasting and 2-hours post-prandial -- confirmed that patient has [employer] Pharmacist at PCP office involved in care; confirmed that she is able to verbalize accurate understanding of prescribed insulin dosing -- discussed need for updated eye exam- encouraged patient to promptly schedule   04/21/20: -- Goal extended around possible upcoming transfer back to [place]; patient reports very minimal and inconsistent monitoring/ recording of blood sugars due to holiday season busy-ness -- reinforced previously provided education and encouraged patient to take steps to get back on track- patient verbalizes agreement with same -- encouraged patient to begin monitoring/ recording blood sugars twice daily for 2 weeks; discussed value of both fasting and 2-hour post-prandial values -- discussed importance of portion control especially during holiday times -- confirmed patient has continues following diabetic diet and has been limiting carbohydrates  05/04/20: -- confirmed patient diligently monitored/ recorded blood sugars at home after our last outreach- reviewed with patient  today -- reports fasting blood sugars between 107-194 over last 2 weeks; between 107-171 over last one week -- reports post-prandial blood sugar readings between 136- 266 over last 2 weeks; between 136-219 over last one week -- confirmed no recent hypoglycemic episodes -- confirmed patient remains able to verbalize accurate dosing of insulin and is taking as instructed -- confirmed patient feels "back on track" with diet now that holiday season is over- positive reinforcement provided; encouraged patient's ongoing adherence to low sugar/ low carb diet; reinforced previously provided education around same: patient will continue to require ongoing reinforcement -- confirmed accurate understanding of upcoming  provider appointments; patient continues to drive self to provider appointments -- Education provided around signs/ symptoms MI/ CVA, along with corresponding action plan for same     Task: Alleviate Barriers to Glycemic Management   Due Date: 07/21/2021  Note:   Care Management Activities:    - barriers to adherence to treatment plan identified - blood glucose monitoring encouraged - blood glucose readings reviewed - individualized medical nutrition therapy provided - mutual A1C goal set or reviewed - self-awareness of signs/symptoms of hypo or hyperglycemia encouraged - use of blood glucose monitoring log promoted    Notes:  [id number] RN discussed with patient to monitor blood sugars more frequently  04/21/20: -- Goal extended around possible upcoming transfer back to [place]; patient reports very minimal and inconsistent monitoring/ recording of blood sugars due to holiday season busy-ness -- reinforced previously provided education and encouraged patient to take steps to get back on track- patient verbalizes agreement with same -- encouraged patient to begin monitoring/ recording blood sugars twice daily for 2 weeks; discussed value of both fasting and 2-hour post-prandial values -- discussed importance of portion control especially during holiday times -- confirmed patient has continues following diabetic diet and has been limiting carbohydrates  04/01/20: -- care coordination outreach completed to PCP to facilitate patient obtaining supplies for insulin administration -- education provided to patient around signs/ symptoms hyperglycemia, corresponding action plan for same -- educated patient on importance of ongoing monitoring of blood sugars at home  [id number] per patient she needed a new meter and strips. She had not been checking her blood sugars    Problem: Disease Progression (Diabetes, Type 2)   Priority: Medium  Onset Date: 06/08/2020     Goal: Disease  Progression Prevented or Minimized   Start Date: 06/08/2020  Expected End Date: 07/21/2021  This Visit's Progress: On track  Recent Progress: Not on track  Priority: Medium  Note:   Evidence-based guidance:  Prepare patient for laboratory and diagnostic exams based on risk and presentation.  Encourage lifestyle changes, such as increased intake of plant-based foods, stress reduction, consistent physical activity and smoking cessation to prevent long-term complications and chronic disease.   Individualize activity and exercise recommendations while considering potential limitations, such as neuropathy, retinopathy or the ability to prevent hyperglycemia or hypoglycemia.   Prepare patient for use of pharmacologic therapy that may include antihypertensive, analgesic, prostaglandin E1 with periodic adjustments, based on presenting chronic condition and laboratory results.  Assess signs/symptoms and risk factors for hypertension, sleep-disordered breathing, neuropathy (including changes in gait and balance), retinopathy, nephropathy and sexual dysfunction.  Ensure completion of annual comprehensive foot exam and dilated eye exam.   Implement additional individualized goals and interventions based on identified risk factors.  Prepare patient for consultation or referral for specialist care, such as ophthalmology, neurology, cardiology, podiatry, nephrology or perinatology.   Notes:  [id number] A1C has decreased      Goals Addressed             This Visit's Progress    [facility] CM: Lifestyle Change-Hypertension   On track    Timeframe:  Long-Range Goal Priority:  Medium Start Date:   [id number]                          Expected End Date:    [id number]        Follow Up Date [id number]   - agree to work together to make changes - ask questions to understand - learn about high blood pressure    Why is this important?   The changes that you are asked to make may be hard to do.  This is especially true  when the changes are life-long.  Knowing why it is important to you is the first step.  Working on the change with your family or support person helps you not feel alone.  Reward yourself and family or support person when goals are met. This can be an activity you choose like bowling, hiking, biking, swimming or shooting hoops.     Notes:   04/08/20 and 04/21/20: -- please resume checking your blood pressure at home: be sure to write down the readings so we can review them next time we talk -- it is a good idea to get a dedicated notebook to write down your blood pressures at home -- take your blood pressures to your doctor appointments so your doctor can review- this will help your doctor know if you are on the right treatment plan and medications -- call your doctor if you are worried about your blood pressure being too high or too low -- continue following a heart healthy and low salt diet  05/04/20: -- [place] job keeping track of your blood pressures at home: keep doing this several times per week and be sure to write them down so you can share with your doctor at your next appointment -- the blood pressures we reviewed today were in very good range -- keep following a low salt diet [id number] Patient is eating low sodium foods     THN CM: Monitor and Manage My Blood Sugar   On track    Timeframe:  Long-Range Goal Priority:  High Start Date:     [id number]                        Expected End Date:     09326712                 Follow Up Date [id number] - check blood sugar at prescribed times - check blood sugar if I feel it is too high or too low - enter blood sugar readings and medication or insulin into daily log - take the blood sugar log to all doctor visits - take the blood sugar meter to all doctor visits    Why is this important?   Checking your blood sugar at home helps to keep it from getting very high or very low.  Writing the results in a diary or log helps the doctor know how  to care for you.  Your blood sugar log should have the time, date and the results.  Also, write down the amount of insulin or other medicine that you take.  Other information, like what you ate, exercise done and how  you were feeling, will also be helpful.     Notes:   04/08/20 and 04/21/20: -- I am glad you received printed educational material mailed to you on 04/01/20: please read and review regularly; let me know if you have questions -- check your blood sugars twice a day: fasting (on an empty stomach in the morning when you wake up) and 2-hours after eating: this will help your doctor know if you are on the right medicines for Diabetes -- take your blood sugar list from home to your doctor appointments with you -- we will review your blood sugars at home when I call you on the phone on May 05, 2019-- if you are doing well, I will transfer you back to the [facility] that you were working with before your recent hospital visit  05/04/20: -- Doristine Devoid job resuming taking your blood sugars at home -- the blood sugars we reviewed today look much better- you are making good progress in lowering your blood sugars: keep up the great work! [id number] patient is monitoring blood sugars. RN reiterated checking the blood sugars AC. [id number] Patient is monitoring blood sugars but not daily     THN CM: Obtain Eye Exam   On track    Timeframe:  Long-Range Goal Priority:  Medium Start Date:  48016553                           Expected End Date:  74827078          Follow Up Date [id number]   - schedule appointment with eye doctor    Why is this important?   Eye check-ups are important when you have diabetes.  Vision loss can be prevented.    Notes:   04/01/20: Goal placed on hold based on current patient needs [id number] Last eye exam [id number]  04/08/20 and 04/21/20: -- As we discussed today, Inez Catalina, please schedule an appointment to get an updated eye and vision exam: Diabetes can lead  to eye and vision problems; you should have a yearly eye exam if you are diabetic  05/04/20: -- As we discussed today, Inez Catalina, please schedule an appointment to get an updated eye and vision exam: Diabetes can lead to eye and vision problems; you should have a yearly eye exam if you are diabetic  [id number] Patient had an eye exam [id number]     [facility] CM: Set My Target A1C   On track    Timeframe:  Long-Range Goal Priority:  High Start Date:      [id number]                       Expected End Date:    59458592  Follow Up Date [id number]   - set target A1C    Why is this important?   Your target A1C is decided together by you and your doctor.  It is based on several things like your age and other health issues.    Notes:   04/01/20: -- ask your doctor what your target/ goal A1-C should be  04/08/20 and 04/21/20: -- [place], it is very important that you check your blood sugars at home so you know how they are running when you have your next A1-C -- Your last A1-C was 10.7 on March 16, 2020; you doctor wants your A1-C to be 7.0 or less; keeping track of your blood sugars will help you stay on track  to reduce your A1-C and get it back under control -- Be sure to contact your doctor if your blood sugars start to get too high or too low, or if you think you are having trouble with your blood sugars -- Please review the information attached about signs/ symptoms of low and high blood sugar; if you experience these signs and symptoms, follow the action plan and let your doctor know  05/04/20: -- please consider making you next appointment with your primary care doctor, for the spring of 2022- your next A1-C is due in March or early April -- your blood sugars are showing good improvement at home: you should see a decrease in your A1-C if your blood sugars remain good at home -- with each visit to your primary care doctor, ask him about your A1-C goal and how he feels about your medications and your  progress over time in achieving this goal -- most primary care doctor's want their patient's to have an A1-C between 6.0- 7.0, which equals a blood sugar at home between 120 and 150  06/08/2020 A1C is 10.7 [id number] A1C 10.7 next meeting her goal [id number] A1C has decreased from 10.7 to 8.0     THN CM: Track and Manage My Blood Pressure-Hypertension   On track       Timeframe:  Long-Range Goal Priority:  High Start Date:      [id number]                  Expected End Date:   43154008                         Follow Up Date [id number]   - check blood pressure 3 times per week - write blood pressure results in a log or diary    Why is this important?   You won't feel high blood pressure, but it can still hurt your blood vessels.  High blood pressure can cause heart or kidney problems. It can also cause a stroke.  Making lifestyle changes like losing a little weight or eating less salt will help.  Checking your blood pressure at home and at different times of the day can help to control blood pressure.  If the doctor prescribes medicine remember to take it the way the doctor ordered.  Call the office if you cannot afford the medicine or if there are questions about it.     Notes:   04/08/20 and 04/21/20: -- please resume checking your blood pressure at home several times each week: be sure to write down the readings so we can review them next time we talk -- it is a good idea to get a dedicated notebook to write down your blood pressures at home -- take your blood pressures to your doctor appointments so your doctor can review- this will help your doctor know if you are on the right treatment plan and medications -- call your doctor if you are worried about your blood pressure being too high or too low  04/01/20: Keep up the good work monitoring your blood pressure at home: take your Blood pressure readings form home to each doctor appointment- this will help your doctor make sure that you  are on the right medication and doing everything you should be 05/04/20: -- [place] job keeping track of your blood pressures at home: keep doing this several times per week and be sure to write them down so you can share  with your doctor at your next appointment -- the blood pressures we reviewed today were in very good range -- keep following a low salt diet  [id number] patient is monitoring her blood pressure [id number] Patient is monitoring blood pressure at intervals        Plan:  Follow-up: Follow-up in 4 month(s) RN sent Guide for your journey with diabetes booklet Patient will adhere to portion control throughout the holidays RN discussed healthy eating RN sent update assessment to PCP  [place] [facility] [phone] .

## 2021-01-20 ENCOUNTER — Ambulatory Visit: Payer: Medicare Other | Admitting: Sports Medicine

## 2021-01-20 ENCOUNTER — Other Ambulatory Visit: Payer: Self-pay

## 2021-01-20 ENCOUNTER — Ambulatory Visit
Admission: RE | Admit: 2021-01-20 | Discharge: 2021-01-20 | Disposition: A | Discharge status: Home / Self Care | Payer: Medicare Other | Source: Ambulatory Visit | Attending: Family Medicine | Admitting: Family Medicine

## 2021-01-20 DIAGNOSIS — L84 Corns and callosities: Secondary | ICD-10-CM

## 2021-01-20 DIAGNOSIS — Z1231 Encounter for screening mammogram for malignant neoplasm of breast: Secondary | ICD-10-CM | POA: Diagnosis not present

## 2021-01-20 DIAGNOSIS — M2142 Flat foot [pes planus] (acquired), left foot: Secondary | ICD-10-CM | POA: Diagnosis not present

## 2021-01-20 DIAGNOSIS — M2141 Flat foot [pes planus] (acquired), right foot: Secondary | ICD-10-CM

## 2021-01-20 DIAGNOSIS — M2041 Other hammer toe(s) (acquired), right foot: Secondary | ICD-10-CM

## 2021-01-20 DIAGNOSIS — E1159 Type 2 diabetes mellitus with other circulatory complications: Secondary | ICD-10-CM

## 2021-01-20 DIAGNOSIS — M2042 Other hammer toe(s) (acquired), left foot: Secondary | ICD-10-CM

## 2021-01-20 NOTE — Progress Notes (Signed)
Subjective: Kristen [name] is a 80 y.o. female patient with history of diabetes who presents to office today complaining of pain at callus at left great toe.  Patient also reports that she has difficulty finding properly fitting shoes.  Fasting blood sugar not recorded  A1c unknown  PCP visit 01/05/2021  Patient Active Problem List   Diagnosis Date Noted   Low serum vitamin B12 01/10/2021   Fatigue 01/05/2021   Chronic kidney disease, stage 3a ([place]) 03/18/2020   Hypertensive urgency 03/16/2020   Hip osteoarthritis 03/11/2019   Pain due to onychomycosis of toenails of both feet 02/10/2019   Type 2 diabetes mellitus with vascular disease ([place]) 02/10/2019   Polypharmacy 02/04/2018   Urinary urgency 10/29/2017   Seizure ([place]) 09/11/2017   Aphasia    Borderline hypothyroidism 03/12/2017   Disturbance of skin sensation 09/21/2016   Hemiparesis of right dominant side ([place]) 09/02/2016   Intertrigo 08/16/2016   Vitamin D deficiency 08/08/2016   Gout 03/24/2016   Syncope 02/29/2016   Concussion 02/29/2016   Arthritis of left wrist 02/07/2016   History of CVA (cerebrovascular accident) 11/03/2015   Aortic atherosclerosis ([place])    Anemia 06/18/2015   Sensorineural hearing loss (SNHL) of both ears 03/29/2015   Pain of right middle finger 12/22/2014   Memory deficit 12/04/2014   Primary osteoarthritis of left knee 01/27/2014   Advanced care planning/counseling discussion 12/11/2013   Medicare annual wellness visit, subsequent 12/11/2013   Health maintenance examination 12/11/2013   CAD (coronary artery disease)/coronary calcifications    Chest pain 07/17/2013   Benign paroxysmal positional vertigo 05/26/2013   Hyperlipidemia associated with type 2 diabetes mellitus (HCC)    Severe obesity (BMI 35.0-39.9) with comorbidity ([place])    History of pulmonary embolism    Osteoarthritis    Diabetes mellitus type 2, uncontrolled, with complications ([place])    History of DVT (deep vein  thrombosis) 12/21/2010   Resistant hypertension 05/13/2009   Current Outpatient Medications on File Prior to Visit  Medication Sig Dispense Refill   amLODipine (NORVASC) 5 MG tablet Take 1 tablet (5 mg total) by mouth daily. 90 tablet 3   atorvastatin (LIPITOR) 40 MG tablet Take 1 tablet (40 mg total) by mouth daily. 90 tablet 3   bismuth-metronidazole-tetracycline (PYLERA) 140-125-125 MG capsule Take 3 capsules by mouth 4 (four) times daily -  before meals and at bedtime. 120 capsule 0   Blood Glucose Monitoring Suppl (ONETOUCH VERIO) w/Device KIT Use to check blood sugar up to 4 times a day as directed 1 kit 0   Cholecalciferol (VITAMIN D) 2000 units CAPS Take 1 capsule by mouth daily.     clopidogrel (PLAVIX) 75 MG tablet Take 1 tablet (75 mg total) by mouth daily. 90 tablet 3   glucose blood (ONETOUCH VERIO) test strip USE TO CHECK BLOOD SUGAR UP TO 4 TIMES DAILY AS DIRECTED 100 each 7   hydrALAZINE (APRESOLINE) 50 MG tablet Take 1 tablet (50 mg total) by mouth 2 (two) times daily. 180 tablet 3   hydrochlorothiazide (HYDRODIURIL) 12.5 MG tablet Take 12.5 mg by mouth every morning.     Insulin Glargine (BASAGLAR KWIKPEN) 100 UNIT/ML Inject 18 Units into the skin daily. Gets through PAP     Insulin Pen Needle (B-D UF III MINI PEN NEEDLES) 31G X 5 MM MISC Use once daily 100 each 3   Lancets (ONETOUCH ULTRASOFT) lancets Use to check blood sugar up to 4 times a day as directed 100 each 11   losartan (  COZAAR) 100 MG tablet Take 100 mg by mouth every morning.     metoprolol succinate (TOPROL-XL) 25 MG 24 hr tablet Take 0.5 tablets (12.5 mg total) by mouth daily. 45 tablet 3   nitroGLYCERIN (NITROSTAT) 0.4 MG SL tablet Place 0.4 mg under the tongue every 5 (five) minutes as needed for chest pain.     nystatin cream (MYCOSTATIN) Apply 1 application topically 2 (two) times daily. (Patient taking differently: Apply 1 application topically 2 (two) times daily. As needed) 30 g 0   potassium chloride  (KLOR-CON) 10 MEQ tablet Take 1 tablet (10 mEq total) by mouth every Monday, Wednesday, and Friday. 40 tablet 3   vitamin B-12 (CYANOCOBALAMIN) 1000 MCG tablet Take 1 tablet (1,000 mcg total) by mouth daily.     Current Facility-Administered Medications on File Prior to Visit  Medication Dose Route Frequency Provider Last Rate Last Admin   0.9 %  sodium chloride infusion  500 mL Intravenous Once Armbruster, Carlota Raspberry, MD       Allergies  Allergen Reactions   Bee Venom Anaphylaxis   Peanut-Containing Drug Products Itching   Acetaminophen Other (See Comments)    "Causes me to spit up blood"   Aleve [Naproxen Sodium] Other (See Comments)    Spits up blood   Doxycycline Other (See Comments)    Malaise, GI upset, "felt drunk" and very ill   Metformin And Related Other (See Comments)    Chills, dizziness   Penicillins Rash    Has patient had a PCN reaction causing immediate rash, facial/tongue/throat swelling, SOB or lightheadedness with hypotension: Yes Has patient had a PCN reaction causing severe rash involving mucus membranes or skin necrosis: No Has patient had a PCN reaction that required hospitalization No Has patient had a PCN reaction occurring within the last 10 years: No If all of the above answers are "NO", then may proceed with Cephalosporin use.     Recent Results (from the past 2160 hour(s))  H. pylori antigen, stool     Status: None   Collection Time: 11/02/20 11:07 AM  Result Value Ref Range   H pylori Ag, Stl Negative Negative  POCT glycosylated hemoglobin (Hb A1C)     Status: Abnormal   Collection Time: 01/05/21 11:15 AM  Result Value Ref Range   Hemoglobin A1C 8.0 (A) 4.0 - 5.6 %   HbA1c POC (<> result, manual entry)     HbA1c, POC (prediabetic range)     HbA1c, POC (controlled diabetic range)    Vitamin B12     Status: None   Collection Time: 01/05/21 11:45 AM  Result Value Ref Range   Vitamin B-12 373 211 - 911 pg/mL  CBC with Differential/Platelet      Status: Abnormal   Collection Time: 01/05/21 11:45 AM  Result Value Ref Range   WBC 8.4 4.0 - 10.5 K/uL   RBC 4.04 3.87 - 5.11 Mil/uL   Hemoglobin 11.1 (L) 12.0 - 15.0 g/dL   HCT 35.2 (L) 36.0 - 46.0 %   MCV 87.2 78.0 - 100.0 fl   MCHC 31.4 30.0 - 36.0 g/dL   RDW 15.3 11.5 - 15.5 %   Platelets 247.0 150.0 - 400.0 K/uL   Neutrophils Relative % 68.4 43.0 - 77.0 %   Lymphocytes Relative 24.6 12.0 - 46.0 %   Monocytes Relative 6.0 3.0 - 12.0 %   Eosinophils Relative 0.6 0.0 - 5.0 %   Basophils Relative 0.4 0.0 - 3.0 %   Neutro Abs 5.7  1.4 - 7.7 K/uL   Lymphs Abs 2.1 0.7 - 4.0 K/uL   Monocytes Absolute 0.5 0.1 - 1.0 K/uL   Eosinophils Absolute 0.1 0.0 - 0.7 K/uL   Basophils Absolute 0.0 0.0 - 0.1 K/uL  Basic metabolic panel     Status: Abnormal   Collection Time: 01/05/21 11:45 AM  Result Value Ref Range   Sodium 142 135 - 145 mEq/L   Potassium 3.8 3.5 - 5.1 mEq/L   Chloride 102 96 - 112 mEq/L   CO2 30 19 - 32 mEq/L   Glucose, Bld 162 (H) 70 - 99 mg/dL   BUN 27 (H) 6 - 23 mg/dL   Creatinine, Ser 1.13 0.40 - 1.20 mg/dL   GFR 46.30 (L) >60.00 mL/min    Comment: Calculated using the CKD-EPI Creatinine Equation (2021)   Calcium 9.6 8.4 - 10.5 mg/dL  TSH     Status: None   Collection Time: 01/05/21 11:45 AM  Result Value Ref Range   TSH 3.71 0.35 - 5.50 uIU/mL  T4, free     Status: None   Collection Time: 01/05/21 11:45 AM  Result Value Ref Range   Free T4 0.90 0.60 - 1.60 ng/dL    Comment: Specimens from patients who are undergoing biotin therapy and /or ingesting biotin supplements may contain high levels of biotin.  The higher biotin concentration in these specimens interferes with this Free T4 assay.  Specimens that contain high levels  of biotin may cause false high results for this Free T4 assay.  Please interpret results in light of the total clinical presentation of the patient.      Objective: General: Patient is awake, alert, and oriented x 3 and in no acute  distress.  Integument: Skin is warm, dry and supple bilateral.  Nails are short and well manicured.  Callus plantar medial left hallux.  Remaining integument unremarkable.  Vasculature:  Dorsalis Pedis pulse 1/4 bilateral. Posterior Tibial pulse 1/4 bilateral. Capillary fill time <5 sec 1-5 bilateral. Positive hair growth to the level of the digits.Temperature gradient within normal limits. No varicosities present bilateral.  Trace edema present bilateral.   Neurology: The patient has intact sensation measured with a 5.07/10g Semmes Weinstein Monofilament at all pedal sites bilateral . Vibratory sensation diminished bilateral with tuning fork. No Babinski sign present bilateral.   Musculoskeletal: Pes planus and hammertoe pedal deformities noted bilateral. Muscular strength 5/5 in all lower extremity muscular groups bilateral without pain on range of motion . No tenderness with calf compression bilateral.  Assessment and Plan: Problem List Items Addressed This Visit       Cardiovascular and Mediastinum   Type 2 diabetes mellitus with vascular disease ([place])   Other Visit Diagnoses     Callus    -  Primary   Pes planus of both feet       Hammer toes of both feet           -Examined patient. -Discussed and educated patient on diabetic foot care, especially with  regards to the vascular, neurological and musculoskeletal systems.  -Stressed the importance of good glycemic control and the detriment of not  controlling glucose levels in relation to the foot. -Mechanically debrided callus x1 at left great toe using sterile 15 blade without incident -Recommend diabetic shoes; return as scheduled for shoe measurements -Answered all patient questions -Patient to return as above -Patient advised to call the office if any problems or questions arise in the meantime.  Landis Martins, DPM

## 2021-01-21 ENCOUNTER — Encounter: Payer: Self-pay | Admitting: Family Medicine

## 2021-01-21 ENCOUNTER — Telehealth: Payer: Self-pay | Admitting: Family Medicine

## 2021-01-21 DIAGNOSIS — Z794 Long term (current) use of insulin: Secondary | ICD-10-CM

## 2021-01-21 DIAGNOSIS — E113392 Type 2 diabetes mellitus with moderate nonproliferative diabetic retinopathy without macular edema, left eye: Secondary | ICD-10-CM

## 2021-01-21 NOTE — Telephone Encounter (Signed)
Spoke with pt relaying Dr. Darnell Level 's message.  Pt verbalizes understanding and prefers to go to [facility].

## 2021-01-21 NOTE — Telephone Encounter (Addendum)
Please in the future make sure the preferred location is established prior to putting in the referral. This will help to lessen any confusion with the referral and to ensure the patients are taken care of in a timely manner.   Patient lives in [place] so with no location selected in the referral I would have sent this referral to a location in the [place] area. Per the TE the patient prefers [facility] location.   This was caught prior to faxing any records to the wrong office.   Referral was sent to [facility] Address: [address], [place], [place] [address] Phone: [phone]  Thank you

## 2021-01-21 NOTE — Telephone Encounter (Signed)
Received abnormal diabetic eye exam - left eye has evidence of diabetes damage. For this reason I recommend referral to eye specialist. Referral placed. Plz update chart.

## 2021-01-27 ENCOUNTER — Other Ambulatory Visit: Payer: Self-pay

## 2021-01-27 ENCOUNTER — Ambulatory Visit (INDEPENDENT_AMBULATORY_CARE_PROVIDER_SITE_OTHER): Payer: Medicare Other | Admitting: *Deleted

## 2021-01-27 DIAGNOSIS — E1159 Type 2 diabetes mellitus with other circulatory complications: Secondary | ICD-10-CM

## 2021-01-27 DIAGNOSIS — M2141 Flat foot [pes planus] (acquired), right foot: Secondary | ICD-10-CM

## 2021-01-27 DIAGNOSIS — L84 Corns and callosities: Secondary | ICD-10-CM

## 2021-01-27 DIAGNOSIS — M2142 Flat foot [pes planus] (acquired), left foot: Secondary | ICD-10-CM

## 2021-01-27 DIAGNOSIS — M2041 Other hammer toe(s) (acquired), right foot: Secondary | ICD-10-CM

## 2021-01-27 DIAGNOSIS — M2042 Other hammer toe(s) (acquired), left foot: Secondary | ICD-10-CM

## 2021-01-27 NOTE — Progress Notes (Signed)
Patient presents to the office today for diabetic shoe and insole measuring.  Patient was measured with brannock device to determine size and width for 1 pair of extra depth shoes and foam casted for 3 pair of insoles.   Documentation of medical necessity will be sent to patient's treating diabetic doctor to verify and sign.   Patient's diabetic provider: Dr. [name]  Shoes and insoles will be ordered at that time and patient will be notified for an appointment for fitting when they arrive.   Shoe size (per patient): 8.5-9   Brannock measurement: RIGHT - 9.5 C, LEFT - 9.5 B  Patient shoe selection-   1st choice:   Apex A720W  2nd choice:  Apex 830W  Shoe size ordered: Women's 9.5 Medium

## 2021-02-11 ENCOUNTER — Telehealth: Payer: Self-pay

## 2021-02-11 NOTE — Chronic Care Management (AMB) (Addendum)
  Chronic Care Management Pharmacy Assistant   Name: Kristen [name]  MRN: [id number] DOB: 04/15/1941   Reason for Encounter:  Diabetes Disease State    Recent office visits:  01/05/21-PCP-Patient presented for follow up diabetes.Labs ordered new A1C 8.0,vit B12 returned low normal - recommend she start OTC vit B12 1000mcg daily. Kidneys remain impaired but stable.  Thyroid returned normal. She was mildly anemic - we will watch this. ,start B-12 vitamin,decrease metoprolol XL to 1/2 tablet (12.5mg) daily,follow up 3-4 months  Recent consult visits:  01/27/21-[facility]-Patient presented for diabetic shoes and insoles. 01/20/21-Podiatry-Patient presented for left toe callus.recommend diabetic shoes.  Hospital visits:  None in previous 6 months  Medications: Outpatient Encounter Medications as of 02/11/2021  Medication Sig Note   amLODipine (NORVASC) 5 MG tablet Take 1 tablet (5 mg total) by mouth daily.    atorvastatin (LIPITOR) 40 MG tablet Take 1 tablet (40 mg total) by mouth daily.    bismuth-metronidazole-tetracycline (PYLERA) 140-125-125 MG capsule Take 3 capsules by mouth 4 (four) times daily -  before meals and at bedtime.    Blood Glucose Monitoring Suppl (ONETOUCH VERIO) w/Device KIT Use to check blood sugar up to 4 times a day as directed    Cholecalciferol (VITAMIN D) 2000 units CAPS Take 1 capsule by mouth daily.    clopidogrel (PLAVIX) 75 MG tablet Take 1 tablet (75 mg total) by mouth daily.    glucose blood (ONETOUCH VERIO) test strip USE TO CHECK BLOOD SUGAR UP TO 4 TIMES DAILY AS DIRECTED    hydrALAZINE (APRESOLINE) 50 MG tablet Take 1 tablet (50 mg total) by mouth 2 (two) times daily.    hydrochlorothiazide (HYDRODIURIL) 12.5 MG tablet Take 12.5 mg by mouth every morning.    Insulin Glargine (BASAGLAR KWIKPEN) 100 UNIT/ML Inject 18 Units into the skin daily. Gets through PAP    Insulin Pen Needle (B-D UF III MINI PEN NEEDLES) 31G X 5 MM MISC Use once daily     Lancets (ONETOUCH ULTRASOFT) lancets Use to check blood sugar up to 4 times a day as directed    losartan (COZAAR) 100 MG tablet Take 100 mg by mouth every morning.    metoprolol succinate (TOPROL-XL) 25 MG 24 hr tablet Take 0.5 tablets (12.5 mg total) by mouth daily.    nitroGLYCERIN (NITROSTAT) 0.4 MG SL tablet Place 0.4 mg under the tongue every 5 (five) minutes as needed for chest pain. 04/01/2020: Reports has not needed recently    nystatin cream (MYCOSTATIN) Apply 1 application topically 2 (two) times daily. (Patient taking differently: Apply 1 application topically 2 (two) times daily. As needed)    potassium chloride (KLOR-CON) 10 MEQ tablet Take 1 tablet (10 mEq total) by mouth every Monday, Wednesday, and Friday.    vitamin B-12 (CYANOCOBALAMIN) 1000 MCG tablet Take 1 tablet (1,000 mcg total) by mouth daily.    Facility-Administered Encounter Medications as of 02/11/2021  Medication   0.9 %  sodium chloride infusion     Recent Relevant Labs: Lab Results  Component Value Date/Time   HGBA1C 8.0 (A) 01/05/2021 11:15 AM   HGBA1C 10.7 (H) 03/16/2020 05:37 AM   HGBA1C 7.6 (A) 09/25/2019 10:07 AM   HGBA1C 8.2 (H) 03/24/2019 09:57 AM   MICROALBUR 7.8 (H) 08/09/2016 10:28 AM   MICROALBUR 2.6 (H) 12/08/2013 10:31 AM    Kidney Function Lab Results  Component Value Date/Time   CREATININE 1.13 01/05/2021 11:45 AM   CREATININE 1.00 04/07/2020 11:08 AM     CREATININE 1.34 (H) 03/10/2014 12:33 PM   CREATININE 0.97 07/02/2013 12:32 AM   GFR 46.30 (L) 01/05/2021 11:45 AM   GFRNONAA 41 (L) 03/19/2020 03:26 AM   GFRNONAA 41 (L) 03/10/2014 12:33 PM   GFRNONAA 59 (L) 07/02/2013 12:32 AM   GFRAA >60 02/20/2018 10:26 AM   GFRAA 50 (L) 03/10/2014 12:33 PM   GFRAA >60 07/02/2013 12:32 AM     Contacted patient on 02/11/21 to discuss diabetes disease state.   Current antihyperglycemic regimen:  Sitagliptin/Januvia 50 mg - 1 tablet daily  (PAP) Basaglar - Inject 18 units daily at bedtime  (PAP)    Patient verbally confirms she is taking the above medications as directed. Yes  What diet changes have been made to improve diabetes control? The patient reports she  eats more baked foods and increasing her water intake.  What recent interventions/DTPs have been made to improve glycemic control:   None identified  Have there been any recent hospitalizations or ED visits since last visit with CPP? No  Patient denies hypoglycemic symptoms, including Pale, Sweaty, Shaky, Hungry, Nervous/irritable, and Vision changes  Patient denies hyperglycemic symptoms, including blurry vision, excessive thirst, fatigue, polyuria, and weakness  How often are you checking your blood sugar? once daily  What are your blood sugars ranging?  Fasting: 02/11/21-104  has been ranging 136,176,190 this month <200   During the week, how often does your blood glucose drop below 70? Never  Are you checking your feet daily/regularly? Yes  Adherence Review: Is the patient currently on a STATIN medication? Yes Is the patient currently on ACE/ARB medication? Yes Does the patient have >5 day gap between last estimated fill dates? No  Care Gaps: Annual wellness visit in last year? No Most recent A1C reading: 8.0  01/05/21 Most Recent BP reading:  136/68  62-P  Last eye exam / retinopathy screening: 01/21/21 Last diabetic foot exam: 01/05/21  Counseled patient on importance of annual eye and foot exam.  Up to date with screenings  Star Rating Drugs:  Medication:  Last Fill: Day Supply Losartan 100mg          12/07/2020      90 Atorvastatin 40mg       12/07/2020      90    PCP appointment on 04/05/21-Telemedicine RN-AWV  Michelle Adams, CPP notified   , CCMA Clincal Pharmacy Assistant [phone]  I have reviewed the care management and care coordination activities outlined in this encounter and I am certifying that I agree with the content of this note. No further action  required.  Michelle Adams, PharmD Clinical Pharmacist [facility] [place] [phone]    

## 2021-02-11 NOTE — Telephone Encounter (Signed)
Refill request for insulin pen needles sent to pharmacy.

## 2021-02-25 ENCOUNTER — Other Ambulatory Visit: Payer: Self-pay | Admitting: Family Medicine

## 2021-02-25 DIAGNOSIS — E1165 Type 2 diabetes mellitus with hyperglycemia: Secondary | ICD-10-CM

## 2021-02-28 ENCOUNTER — Other Ambulatory Visit: Payer: Self-pay | Admitting: Family Medicine

## 2021-03-01 ENCOUNTER — Telehealth: Payer: Self-pay

## 2021-03-01 NOTE — Chronic Care Management (AMB) (Addendum)
Chronic Care Management Pharmacy Assistant   Name: Kristen [name]  MRN: [id number] DOB: 12/14/1940  Reason for Encounter: Medication Adherence and Delivery Coordination   Recent office visits:  None since last CCM contact  Recent consult visits:  None since last CCM contact  Hospital visits:  None in previous 6 months  Medications: Outpatient Encounter Medications as of 03/01/2021  Medication Sig Note   amLODipine (NORVASC) 5 MG tablet TAKE ONE TABLET BY MOUTH DAILY    atorvastatin (LIPITOR) 40 MG tablet TAKE ONE TABLET BY MOUTH ONCE DAILY    bismuth-metronidazole-tetracycline (PYLERA) 140-125-125 MG capsule Take 3 capsules by mouth 4 (four) times daily -  before meals and at bedtime.    Blood Glucose Monitoring Suppl (ONETOUCH VERIO) w/Device KIT Use to check blood sugar up to 4 times a day as directed    Cholecalciferol (VITAMIN D) 2000 units CAPS Take 1 capsule by mouth daily.    clopidogrel (PLAVIX) 75 MG tablet Take 1 tablet (75 mg total) by mouth daily.    glucose blood (ONETOUCH VERIO) test strip Check blood sugar once daily    hydrALAZINE (APRESOLINE) 50 MG tablet Take 1 tablet (50 mg total) by mouth 2 (two) times daily.    hydrochlorothiazide (HYDRODIURIL) 12.5 MG tablet Take 12.5 mg by mouth every morning.    Insulin Glargine (BASAGLAR KWIKPEN) 100 UNIT/ML Inject 18 Units into the skin daily. Gets through PAP    Insulin Pen Needle (B-D UF III MINI PEN NEEDLES) 31G X 5 MM MISC Use once daily    Lancets (ONETOUCH ULTRASOFT) lancets Use to check blood sugar up to 4 times a day as directed    losartan (COZAAR) 100 MG tablet Take 100 mg by mouth every morning.    metoprolol succinate (TOPROL-XL) 25 MG 24 hr tablet Take 0.5 tablets (12.5 mg total) by mouth daily.    nitroGLYCERIN (NITROSTAT) 0.4 MG SL tablet Place 0.4 mg under the tongue every 5 (five) minutes as needed for chest pain. 04/01/2020: Reports has not needed recently    nystatin cream (MYCOSTATIN) Apply 1  application topically 2 (two) times daily. (Patient taking differently: Apply 1 application topically 2 (two) times daily. As needed)    potassium chloride (KLOR-CON) 10 MEQ tablet Take 1 tablet (10 mEq total) by mouth every Monday, Wednesday, and Friday.    vitamin B-12 (CYANOCOBALAMIN) 1000 MCG tablet Take 1 tablet (1,000 mcg total) by mouth daily.    Facility-Administered Encounter Medications as of 03/01/2021  Medication   0.9 %  sodium chloride infusion   BP Readings from Last 3 Encounters:  01/05/21 136/68  08/16/20 (!) 161/79  07/07/20 120/66    Lab Results  Component Value Date   HGBA1C 8.0 (A) 01/05/2021      No OVs, Consults, or hospital visits since last care coordination call / Pharmacist visit. No medication changes indicated   Last adherence delivery date:12/13/20      Patient is due for next adherence delivery on: 03/11/21  Spoke with patient on 03/01/21 reviewed medications and coordinated delivery.  This delivery to include: Adherence Packaging  90 Days  Packs: Atorvastatin 40 mg 1 tablet daily (breakfast) Metoprolol Succinate 25 mg 1 tablet daily (breakfast) Hydralazine 50 mg 1 tablet twice daily (1-breakfast, 1-bedtime) Clopidogrel 75 mg 1 tablet daily (breakfast) Potassium chloride ER 10 Meq 1 tablet daily on Mon, Wed, Friday (Breakfast) Amlodipine 5 mg 1 tablet daily (breakfast) Losartan 100 mg-Hydrochlorothiazide 12.5 mg 1 tablet daily (breakfast) Hydrochlorothiazide 12.23m take 1  tablet at (breakfast)  VIAL medications: One touch delica plus lancets 78E One touch verio test strips Pen needles use as directed once daily at bedtime   Patient declined the following medications this month: Januvia 50 mg 1 tablet daily (breakfast) - PAP Basaglar 18 units daily - PAP Nystatin 100,000 U- Uses PRN  The patient reports she is getting [name] through the patient assistance program and wants this renewed for 2023.  Any concerns about your  medications? No  How often do you forget or accidentally miss a dose? Never  Do you use a pillbox? No  Is patient in packaging Yes  What is the date on your next pill pack? She was not at home to read package  Any concerns or issues with your packaging? The patient likes the packaging   Refills requested from providers include: amlodipine, atorvastatin   Confirmed delivery date of 03/11/21, advised patient that pharmacy will contact them the morning of delivery.  Recent blood pressure readings are as follows:  No readings available at this time   Recent blood glucose readings are as follows: Fasting:  03/01/21-118      02/25/21-121  Annual wellness visit in last year? Yes Most Recent BP reading:136/68  62-P  If Diabetic: Most recent A1C reading:  8.0  01/05/21 Last eye exam / retinopathy screening: 01/12/21 Last diabetic foot exam: 01/05/21  [name], CPP notified  Avel Sensor, [place] Assistant [phone]  I have reviewed the care management and care coordination activities outlined in this encounter and I am certifying that I agree with the content of this note. See addendum.  [name], PharmD Clinical Pharmacist [facility] [phone]

## 2021-03-02 NOTE — Telephone Encounter (Signed)
Pap renewals needed for [name]. Please ask patient if she can come to the office to sign 2023 renewal forms. If not, please mail her the forms.  - Kristen [name] is through [employer], renewal application must be self-printed. - Basaglar is through [employer], manufacturer will mail renewal forms to the patient.

## 2021-03-14 ENCOUNTER — Ambulatory Visit: Payer: Medicare Other

## 2021-03-14 ENCOUNTER — Other Ambulatory Visit: Payer: Self-pay

## 2021-03-14 DIAGNOSIS — M2142 Flat foot [pes planus] (acquired), left foot: Secondary | ICD-10-CM | POA: Diagnosis not present

## 2021-03-14 DIAGNOSIS — E1159 Type 2 diabetes mellitus with other circulatory complications: Secondary | ICD-10-CM | POA: Diagnosis not present

## 2021-03-14 DIAGNOSIS — M2041 Other hammer toe(s) (acquired), right foot: Secondary | ICD-10-CM | POA: Diagnosis not present

## 2021-03-14 DIAGNOSIS — M2141 Flat foot [pes planus] (acquired), right foot: Secondary | ICD-10-CM | POA: Diagnosis not present

## 2021-03-14 DIAGNOSIS — M2042 Other hammer toe(s) (acquired), left foot: Secondary | ICD-10-CM | POA: Diagnosis not present

## 2021-03-14 NOTE — Progress Notes (Signed)
SITUATION Reason for Visit: Fitting of Diabetic Shoes & Insoles Patient / Caregiver Report:  Patient is extremely satisfied with shoes  OBJECTIVE DATA: Patient History / Diagnosis:  Diabetes Mellitus Change in Status:   None  ACTIONS PERFORMED: In-Person Delivery, patient was fit with: - 1x pair [id number] PDAC approved prefabricated Diabetic Shoes: Apex A720W 9.29M - 3x pair [id number] PDAC approved CAM milled custom diabetic insoles  Shoes and insoles were verified for structural integrity and safety. Patient wore shoes and insoles in office. Skin was inspected and free of areas of concern after wearing shoes and inserts. Shoes and inserts fit properly. Patient / Caregiver provided with ferbal instruction and demonstration regarding donning, doffing, wear, care, proper fit, function, purpose, cleaning, and use of shoes and insoles ' and in all related precautions and risks and benefits regarding shoes and insoles. Patient / Caregiver was instructed to wear properly fitting socks with shoes at all times. Patient was also provided with verbal instruction regarding how to report any failures or malfunctions of shoes or inserts, and necessary follow up care. Patient / Caregiver was also instructed to contact physician regarding change in status that may affect function of shoes and inserts.   Patient / Caregiver verbalized undersatnding of instruction provided. Patient / Caregiver demonstrated independence with proper donning and doffing of shoes and inserts.  PLAN Patient to follow up as needed. Plan of care was discussed with and agreed upon by patient and/or caregiver. All questions were answered and concerns addressed.

## 2021-04-04 NOTE — Progress Notes (Signed)
Subjective:   Kristen [name] is a 80 y.o. female who presents for Medicare Annual (Subsequent) preventive examination.  I connected with Kristen [name] today by telephone and verified that I am speaking with the correct person using two identifiers. 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    I discussed the limitations, risks, security and privacy concerns of performing an evaluation and management service by telephone and the availability of in person appointments. I also discussed with the patient that there may be a patient responsible charge related to this service. The patient expressed understanding and verbally consented to this telephonic visit.    Interactive audio and video telecommunications were attempted between this provider and patient, however failed, due to patient having technical difficulties OR patient did not have access to video capability.  We continued and completed visit with audio only.  Some vital signs may be absent or patient reported.   Time Spent with patient on telephone encounter: 25 minutes  Review of Systems     Cardiac Risk Factors include: advanced age (>57mn, >>65women);diabetes mellitus;hypertension;dyslipidemia     Objective:    Today's Vitals   04/05/21 1228  Weight: 192 lb (87.1 kg)  Height: '5\' 1"'  (1.549 m)   Body mass index is 36.28 kg/m.  Advanced Directives 04/05/2021 04/08/2020 03/31/2020 03/14/2019 03/12/2018 10/31/2017 10/17/2017  Does Patient Have a Medical Advance Directive? Yes Yes Yes Yes Yes Yes No  Type of AParamedicof [place]Cooke CityLiving will HEllettsvilleLiving will HYatesvilleLiving will HHubbardLiving will HDanaLiving will HStorm LakeOut of facility DNR (pink MOST or yellow form);Living will -  Does patient want to make changes to medical  advance directive? Yes (MAU/Ambulatory/Procedural Areas - Information given) No - Patient declined - - - No - Patient declined -  Copy of HNew Fairviewin Chart? Yes - validated most recent copy scanned in chart (See row information) - Yes - validated most recent copy scanned in chart (See row information) Yes - validated most recent copy scanned in chart (See row information) No - copy requested - -  Would patient like information on creating a medical advance directive? - - - - - - Yes (MAU/Ambulatory/Procedural Areas - Information given)    Current Medications (verified) Outpatient Encounter Medications as of 04/05/2021  Medication Sig   amLODipine (NORVASC) 5 MG tablet TAKE ONE TABLET BY MOUTH DAILY   atorvastatin (LIPITOR) 40 MG tablet TAKE ONE TABLET BY MOUTH ONCE DAILY   bismuth-metronidazole-tetracycline (PYLERA) 140-125-125 MG capsule Take 3 capsules by mouth 4 (four) times daily -  before meals and at bedtime.   Blood Glucose Monitoring Suppl (ONETOUCH VERIO) w/Device KIT Use to check blood sugar up to 4 times a day as directed   Cholecalciferol (VITAMIN D) 2000 units CAPS Take 1 capsule by mouth daily.   clopidogrel (PLAVIX) 75 MG tablet Take 1 tablet (75 mg total) by mouth daily.   glucose blood (ONETOUCH VERIO) test strip Check blood sugar once daily   hydrALAZINE (APRESOLINE) 50 MG tablet Take 1 tablet (50 mg total) by mouth 2 (two) times daily.   hydrochlorothiazide (HYDRODIURIL) 12.5 MG tablet Take 12.5 mg by mouth every morning.   Insulin Glargine (BASAGLAR KWIKPEN) 100 UNIT/ML Inject 18 Units into the skin daily. Gets through PAP   Insulin Pen Needle (B-D UF III MINI PEN NEEDLES) 31G X 5 MM MISC Use  once daily   Lancets (ONETOUCH ULTRASOFT) lancets Use to check blood sugar up to 4 times a day as directed   losartan (COZAAR) 100 MG tablet Take 100 mg by mouth every morning.   metoprolol succinate (TOPROL-XL) 25 MG 24 hr tablet Take 0.5 tablets (12.5 mg total) by  mouth daily.   nitroGLYCERIN (NITROSTAT) 0.4 MG SL tablet Place 0.4 mg under the tongue every 5 (five) minutes as needed for chest pain.   nystatin cream (MYCOSTATIN) Apply 1 application topically 2 (two) times daily. (Patient taking differently: Apply 1 application topically 2 (two) times daily. As needed)   potassium chloride (KLOR-CON) 10 MEQ tablet Take 1 tablet (10 mEq total) by mouth every Monday, Wednesday, and Friday.   vitamin B-12 (CYANOCOBALAMIN) 1000 MCG tablet Take 1 tablet (1,000 mcg total) by mouth daily.   Facility-Administered Encounter Medications as of 04/05/2021  Medication   0.9 %  sodium chloride infusion    Allergies (verified) Bee venom, Peanut-containing drug products, Acetaminophen, Aleve [naproxen sodium], Doxycycline, Metformin and related, and Penicillins   History: Past Medical History:  Diagnosis Date   Abdominal aortic atherosclerosis ([place]) by CT 02/2014   Acute kidney injury ([place]) 02/29/2016   During hospitalization 07/2017   CAD (coronary artery disease)    by CT, per pt h/o MI   Cataract    bilateral-removed   Clotting disorder ([place])    h/o PE   Diabetes type 2, uncontrolled    Frequent headaches    GERD (gastroesophageal reflux disease)    Gout    History of pulmonary embolism 2012   HLD (hyperlipidemia)    HTN (hypertension)    Internal capsule hemorrhage ([facility])    hx of sublacunar infarct involving the right posterior limb of the internal capsule    Morbid obesity ([place])    Myocardial infarction ([place]) 2012   per pt. report, states she was treated with medicine, [facility] at [facility]     Osteoarthritis    knees   Primary localized osteoarthritis of left knee 09/22/2014   Risk for falls 09/28/2017   Sleep apnea 2011   study done in [place], states that since she lost weight she doesn't use the CPAP any longer & she doesn't have a problem with sleep apnea   Stroke [facility])    still has balance problem on occas. , uses cane but that's mainly for the  left knee pain   Syncope 03/01/2016   Thoracic aortic atherosclerosis ([place]) 12/2015   by CXR   Vertigo    hx. benign postitional postural   Past Surgical History:  Procedure Laterality Date   CATARACT EXTRACTION Bilateral 2013   EYE SURGERY     /w IOL   FOOT SURGERY Right    PARTIAL HYSTERECTOMY     for fibroids, ovaries remain   TONSILLECTOMY     TOTAL KNEE ARTHROPLASTY Right 1990s   TOTAL KNEE ARTHROPLASTY Left 09/22/2014   [name], MD   TUBAL LIGATION     Family History  Problem Relation Age of Onset   Cancer Mother        bone   Diabetes Father    Hypertension Father    Cancer Son 32       lung   Congenital heart disease Son 12   Stroke Brother    Colon cancer Neg Hx    Colon polyps Neg Hx    Esophageal cancer Neg Hx    Stomach cancer Neg Hx    Rectal cancer Neg  Hx    Social History   Socioeconomic History   Marital status: Widowed    Spouse name: Not on file   Number of children: 5   Years of education: Not on file   Highest education level: Not on file  Occupational History   Occupation: retired  Tobacco Use   Smoking status: Never   Smokeless tobacco: Never   Tobacco comments:    tobacco use- no   Vaping Use   Vaping Use: Never used  Substance and Sexual Activity   Alcohol use: Not Currently   Drug use: No   Sexual activity: Not on file  Other Topics Concern   Not on file  Social History Narrative   Lives alone   4 grown children   Education: 11th grade   Occupation: retired, was Scientist, water quality   Activity: Does not regularly exercise.    Diet: good water, fruits/vegetables seldom   Transportation: Patient has been educated on her transportation benefit through [place] Determinants of Health   Financial Resource Strain: Low Risk    Difficulty of Paying Living Expenses: Not hard at all  Food Insecurity: No Food Insecurity   Worried About Charity fundraiser in the Last Year: Never true   Arboriculturist in the Last Year:  Never true  Transportation Needs: No Transportation Needs   Lack of Transportation (Medical): No   Lack of Transportation (Non-Medical): No  Physical Activity: Inactive   Days of Exercise per Week: 0 days   Minutes of Exercise per Session: 0 min  Stress: No Stress Concern Present   Feeling of Stress : Not at all  Social Connections: Moderately Isolated   Frequency of Communication with Friends and Family: More than three times a week   Frequency of Social Gatherings with Friends and Family: More than three times a week   Attends Religious Services: More than 4 times per year   Active Member of [employer] or Organizations: No   Attends Archivist Meetings: Never   Marital Status: Widowed    Tobacco Counseling Counseling given: Not Answered Tobacco comments: tobacco use- no    Clinical Intake:  Pre-visit preparation completed: Yes  Pain : No/denies pain     BMI - recorded: 36.31 Nutritional Status: BMI > 30  Obese Nutritional Risks: None Diabetes: Yes CBG done?: No (visit completed over the phone) Did pt. bring in CBG monitor from home?: No  How often do you need to have someone help you when you read instructions, pamphlets, or other written materials from your doctor or pharmacy?: 1 - Never  Diabetes:  Is the patient diabetic?  Yes  If diabetic, was a CBG obtained today?  No , visit completed over the phone. Did the patient bring in their glucometer from home?  No , visit completed over the phone. How often do you monitor your CBG's? 2 times per day.   Financial Strains and Diabetes Management:  Are you having any financial strains with the device, your supplies or your medication? No .  Does the patient want to be seen by Chronic Care Management for management of their diabetes?  No  Would the patient like to be referred to a Nutritionist or for Diabetic Management?  No   Diabetic Exams:  Diabetic Eye Exam: Completed 01/12/21.   Diabetic Foot Exam:  Completed 01/05/21.   Interpreter Needed?: No  Information entered by :: Orrin Brigham LPN   Activities of Daily Living In  your present state of health, do you have any difficulty performing the following activities: 04/05/2021  Hearing? Y  Comment occasional decrease in hearing  Vision? N  Difficulty concentrating or making decisions? N  Walking or climbing stairs? Y  Comment due to knees  Dressing or bathing? N  Doing errands, shopping? N  Preparing Food and eating ? N  Using the Toilet? N  In the past six months, have you accidently leaked urine? N  Do you have problems with loss of bowel control? N  Managing your Medications? N  Managing your Finances? N  Housekeeping or managing your Housekeeping? N  Some recent data might be hidden    Patient Care Team: [name], MD as PCP - General (Family Medicine) Rockey Situ Kathlene November, MD as Consulting Physician (Cardiology) [name], [facility] as Pharmacist (Pharmacist) [place], Eppie Gibson, RN as [place] any recent Medical Services you may have received from other than [facility] providers in the past year (date may be approximate).     Assessment:   This is a routine wellness examination for [place].  Hearing/Vision screen Hearing Screening - Comments:: Occasional decrease in hearing Vision Screening - Comments:: Last exam 01/12/21  Dietary issues and exercise activities discussed: Current Exercise Habits: The patient does not participate in regular exercise at present   Goals Addressed             This Visit's Progress    Patient Stated       Would like maintain current routine       Depression Screen PHQ 2/9 Scores 04/05/2021 04/08/2020 03/31/2020 03/14/2019 03/03/2019 09/27/2018 07/29/2018  PHQ - 2 Score 0 0 0 0 0 0 0  PHQ- 9 Score - - 0 0 - - -    Fall Risk Fall Risk  04/05/2021 04/08/2020 03/31/2020 02/02/2020 12/19/2019  Falls in the past year? 0 0 0 1 1  Comment - - -  - -  Number falls in past yr: 0 0 0 1 1  Comment - Denies recent falls - - -  Injury with Fall? 0 0 0 0 0  Comment - N/A- no falls reported - - -  Risk Factor Category  - - - - -  Risk for fall due to : No Fall Risks Medication side effect Medication side effect History of fall(s);Impaired balance/gait;Impaired mobility History of fall(s);Impaired balance/gait;Impaired mobility  Risk for fall due to: Comment - - - - -  Follow up Falls prevention discussed Falls prevention discussed Falls evaluation completed;Falls prevention discussed Falls evaluation completed;Falls prevention discussed Falls evaluation completed;Falls prevention discussed    FALL RISK PREVENTION PERTAINING TO THE HOME:  Any stairs in or around the home? Yes  If so, are there any without handrails? No  Home free of loose throw rugs in walkways, pet beds, electrical cords, etc? Yes  Adequate lighting in your home to reduce risk of falls? Yes   ASSISTIVE DEVICES UTILIZED TO PREVENT FALLS:  Life alert? No  Use of a cane, walker or w/c? No  Grab bars in the bathroom? Yes  Shower chair or bench in shower? Yes  Elevated toilet seat or a handicapped toilet? Yes   TIMED UP AND GO:  Was the test performed? No , visit completed over the phone.    Cognitive Function: Normal cognitive status assessed by this Nurse Health Advisor. No abnormalities found.   MMSE - Mini Mental State Exam 03/31/2020 03/14/2019 03/12/2018 08/09/2016  Orientation to time 5  '5 5 5  ' Orientation to Place '5 5 5 5  ' Registration '3 3 3 3  ' Attention/ Calculation 5 0 0 0  Recall '3 3 3 3  ' Language- name 2 objects - - 0 0  Language- repeat '1 1 1 1  ' Language- follow 3 step command - - 2 3  Language- follow 3 step command-comments - - unable to follow 1 step of 3 step command -  Language- read & follow direction - - 0 0  Write a sentence - - 0 0  Copy design - - 0 0  Total score - - 19 20        Immunizations Immunization History  Administered  Date(s) Administered   Fluad Quad(high Dose 65+) 03/30/2020   PFIZER Comirnaty(Gray Top)Covid-19 Tri-Sucrose Vaccine 06/22/2020, 07/13/2020   PPD Test 09/25/2014   Pneumococcal Conjugate-13 09/10/2013   Pneumococcal Polysaccharide-23 03/10/2015   Zoster, Live 04/21/2014    TDAP status: Due, Education has been provided regarding the importance of this vaccine. Advised may receive this vaccine at local pharmacy or Health Dept. Aware to provide a copy of the vaccination record if obtained from local pharmacy or Health Dept. Verbalized acceptance and understanding.  Flu Vaccine status: Declined, Education has been provided regarding the importance of this vaccine but patient still declined. Advised may receive this vaccine at local pharmacy or Health Dept. Aware to provide a copy of the vaccination record if obtained from local pharmacy or Health Dept. Verbalized acceptance and understanding.  Pneumococcal vaccine status: Up to date  Covid-19 vaccine status: Declined, Education has been provided regarding the importance of this vaccine but patient still declined. Advised may receive this vaccine at local pharmacy or Health Dept.or vaccine clinic. Aware to provide a copy of the vaccination record if obtained from local pharmacy or Health Dept. Verbalized acceptance and understanding.  Qualifies for Shingles Vaccine? Yes   Zostavax completed Yes   Shingrix Completed?: No.    Education has been provided regarding the importance of this vaccine. Patient has been advised to call insurance company to determine out of pocket expense if they have not yet received this vaccine. Advised may also receive vaccine at local pharmacy or Health Dept. Verbalized acceptance and understanding.  Screening Tests Health Maintenance  Topic Date Due   Zoster Vaccines- Shingrix (1 of 2) Never done   COVID-19 Vaccine (3 - Pfizer risk series) 08/10/2020   INFLUENZA VACCINE  07/22/2021 (Originally 11/22/2020)    TETANUS/TDAP  08/10/2026 (Originally 08/07/1959)   HEMOGLOBIN A1C  07/05/2021   FOOT EXAM  01/05/2022   OPHTHALMOLOGY EXAM  01/12/2022   Pneumonia Vaccine 41+ Years old  Completed   DEXA SCAN  Completed   Hepatitis C Screening  Completed   HPV VACCINES  Aged Out    Health Maintenance  Health Maintenance Due  Topic Date Due   Zoster Vaccines- Shingrix (1 of 2) Never done   COVID-19 Vaccine (3 - Pfizer risk series) 08/10/2020    Colorectal cancer screening: No longer required.   Mammogram status: Completed 01/20/21. Repeat every year  Bone Density status: due, patient declined today, will discuss with PCP when decided  Lung Cancer Screening: (Low Dose CT Chest recommended if Age 3-80 years, 30 pack-year currently smoking OR have quit w/in 15years.) does not qualify.    Additional Screening:  Hepatitis C Screening: does qualify; Completed 03/30/20  Vision Screening: Recommended annual ophthalmology exams for early detection of glaucoma and other disorders of the eye. Is the patient up to  date with their annual eye exam?  Yes  Who is the provider or what is the name of the office in which the patient attends annual eye exams? Provider Information unavailable.   Dental Screening: Recommended annual dental exams for proper oral hygiene  Community Resource Referral / Chronic Care Management: CRR required this visit?  No   CCM required this visit?  No      Plan:     I have personally reviewed and noted the following in the patient's chart:   Medical and social history Use of alcohol, tobacco or illicit drugs  Current medications and supplements including opioid prescriptions.  Functional ability and status Nutritional status Physical activity Advanced directives List of other physicians Hospitalizations, surgeries, and ER visits in previous 12 months Vitals Screenings to include cognitive, depression, and falls Referrals and appointments  In addition, I have  reviewed and discussed with patient certain preventive protocols, quality metrics, and best practice recommendations. A written personalized care plan for preventive services as well as general preventive health recommendations were provided to patient.   Due to this being a telephonic visit, the after visit summary with patients personalized plan was offered to patient via mail or my-chart. Patient preferred to pick up at office at next visit.   Loma Messing, LPN   [id number]   Nurse Health Advisor  Nurse Notes: none

## 2021-04-05 ENCOUNTER — Ambulatory Visit (INDEPENDENT_AMBULATORY_CARE_PROVIDER_SITE_OTHER): Payer: Medicare Other

## 2021-04-05 VITALS — Ht 61.0 in | Wt 192.0 lb

## 2021-04-05 DIAGNOSIS — Z0000 Encounter for general adult medical examination without abnormal findings: Secondary | ICD-10-CM

## 2021-04-05 NOTE — Patient Instructions (Signed)
[name] , Thank you for taking time to complete your Medicare Wellness Visit. I appreciate your ongoing commitment to your health goals. Please review the following plan we discussed and let me know if I can assist you in the future.   Screening recommendations/referrals: Colonoscopy: no longer required  Mammogram: up to date, completed 01/20/21 Bone Density: due, last completed 12828/16, discuss with PCP to schedule if you change your mind Recommended yearly ophthalmology/optometry visit for glaucoma screening and checkup Recommended yearly dental visit for hygiene and checkup  Vaccinations: Influenza vaccine:Due-May obtain vaccine at our office or your local pharmacy. Pneumococcal vaccine: up to date Tdap vaccine: Due-May obtain vaccine at our office or your local pharmacy. Shingles vaccine: Discuss with your local pharmacy Covid-19:newest booster available at your local pharmacy  Advanced directives: copy on file   Conditions/risks identified: see problem list  Next appointment: Follow up in one year for your annual wellness visit    Preventive Care 65 Years and Older, Female Preventive care refers to lifestyle choices and visits with your health care provider that can promote health and wellness. What does preventive care include? A yearly physical exam. This is also called an annual well check. Dental exams once or twice a year. Routine eye exams. Ask your health care provider how often you should have your eyes checked. Personal lifestyle choices, including: Daily care of your teeth and gums. Regular physical activity. Eating a healthy diet. Avoiding tobacco and drug use. Limiting alcohol use. Practicing safe sex. Taking low-dose aspirin every day. Taking vitamin and mineral supplements as recommended by your health care provider. What happens during an annual well check? The services and screenings done by your health care provider during your annual well check will  depend on your age, overall health, lifestyle risk factors, and family history of disease. Counseling  Your health care provider may ask you questions about your: Alcohol use. Tobacco use. Drug use. Emotional well-being. Home and relationship well-being. Sexual activity. Eating habits. History of falls. Memory and ability to understand (cognition). Work and work Statistician. Reproductive health. Screening  You may have the following tests or measurements: Height, weight, and BMI. Blood pressure. Lipid and cholesterol levels. These may be checked every 5 years, or more frequently if you are over 58 years old. Skin check. Lung cancer screening. You may have this screening every year starting at age 10 if you have a 30-pack-year history of smoking and currently smoke or have quit within the past 15 years. Fecal occult blood test (FOBT) of the stool. You may have this test every year starting at age 60. Flexible sigmoidoscopy or colonoscopy. You may have a sigmoidoscopy every 5 years or a colonoscopy every 10 years starting at age 30. Hepatitis C blood test. Hepatitis B blood test. Sexually transmitted disease (STD) testing. Diabetes screening. This is done by checking your blood sugar (glucose) after you have not eaten for a while (fasting). You may have this done every 1-3 years. Bone density scan. This is done to screen for osteoporosis. You may have this done starting at age 70. Mammogram. This may be done every 1-2 years. Talk to your health care provider about how often you should have regular mammograms. Talk with your health care provider about your test results, treatment options, and if necessary, the need for more tests. Vaccines  Your health care provider may recommend certain vaccines, such as: Influenza vaccine. This is recommended every year. Tetanus, diphtheria, and acellular pertussis (Tdap, Td) vaccine. You may  need a Td booster every 10 years. Zoster vaccine. You may  need this after age 75. Pneumococcal 13-valent conjugate (PCV13) vaccine. One dose is recommended after age 15. Pneumococcal polysaccharide (PPSV23) vaccine. One dose is recommended after age 55. Talk to your health care provider about which screenings and vaccines you need and how often you need them. This information is not intended to replace advice given to you by your health care provider. Make sure you discuss any questions you have with your health care provider. Document Released: 05/07/2015 Document Revised: 12/29/2015 Document Reviewed: 02/09/2015 Elsevier Interactive Patient Education  2017 [place] Prevention in the Home Falls can cause injuries. They can happen to people of all ages. There are many things you can do to make your home safe and to help prevent falls. What can I do on the outside of my home? Regularly fix the edges of walkways and driveways and fix any cracks. Remove anything that might make you trip as you walk through a door, such as a raised step or threshold. Trim any bushes or trees on the path to your home. Use bright outdoor lighting. Clear any walking paths of anything that might make someone trip, such as rocks or tools. Regularly check to see if handrails are loose or broken. Make sure that both sides of any steps have handrails. Any raised decks and porches should have guardrails on the edges. Have any leaves, snow, or ice cleared regularly. Use sand or salt on walking paths during winter. Clean up any spills in your garage right away. This includes oil or grease spills. What can I do in the bathroom? Use night lights. Install grab bars by the toilet and in the tub and shower. Do not use towel bars as grab bars. Use non-skid mats or decals in the tub or shower. If you need to sit down in the shower, use a plastic, non-slip stool. Keep the floor dry. Clean up any water that spills on the floor as soon as it happens. Remove soap buildup in  the tub or shower regularly. Attach bath mats securely with double-sided non-slip rug tape. Do not have throw rugs and other things on the floor that can make you trip. What can I do in the bedroom? Use night lights. Make sure that you have a light by your bed that is easy to reach. Do not use any sheets or blankets that are too big for your bed. They should not hang down onto the floor. Have a firm chair that has side arms. You can use this for support while you get dressed. Do not have throw rugs and other things on the floor that can make you trip. What can I do in the kitchen? Clean up any spills right away. Avoid walking on wet floors. Keep items that you use a lot in easy-to-reach places. If you need to reach something above you, use a strong step stool that has a grab bar. Keep electrical cords out of the way. Do not use floor polish or wax that makes floors slippery. If you must use wax, use non-skid floor wax. Do not have throw rugs and other things on the floor that can make you trip. What can I do with my stairs? Do not leave any items on the stairs. Make sure that there are handrails on both sides of the stairs and use them. Fix handrails that are broken or loose. Make sure that handrails are as long as the stairways.  Check any carpeting to make sure that it is firmly attached to the stairs. Fix any carpet that is loose or worn. Avoid having throw rugs at the top or bottom of the stairs. If you do have throw rugs, attach them to the floor with carpet tape. Make sure that you have a light switch at the top of the stairs and the bottom of the stairs. If you do not have them, ask someone to add them for you. What else can I do to help prevent falls? Wear shoes that: Do not have high heels. Have rubber bottoms. Are comfortable and fit you well. Are closed at the toe. Do not wear sandals. If you use a stepladder: Make sure that it is fully opened. Do not climb a closed  stepladder. Make sure that both sides of the stepladder are locked into place. Ask someone to hold it for you, if possible. Clearly mark and make sure that you can see: Any grab bars or handrails. First and last steps. Where the edge of each step is. Use tools that help you move around (mobility aids) if they are needed. These include: Canes. Walkers. Scooters. Crutches. Turn on the lights when you go into a dark area. Replace any light bulbs as soon as they burn out. Set up your furniture so you have a clear path. Avoid moving your furniture around. If any of your floors are uneven, fix them. If there are any pets around you, be aware of where they are. Review your medicines with your doctor. Some medicines can make you feel dizzy. This can increase your chance of falling. Ask your doctor what other things that you can do to help prevent falls. This information is not intended to replace advice given to you by your health care provider. Make sure you discuss any questions you have with your health care provider. Document Released: 02/04/2009 Document Revised: 09/16/2015 Document Reviewed: 05/15/2014 Elsevier Interactive Patient Education  2017 [employer].

## 2021-04-15 ENCOUNTER — Other Ambulatory Visit: Payer: Self-pay | Admitting: *Deleted

## 2021-04-15 NOTE — Patient Outreach (Addendum)
[facility]) Care Management  04/15/2021  Kristen [name] 11-27-1940 [id number]   RN Health Coach received telephone call from patient.  [employer] compliance verified. Patient was concerned that her blood sugar was 100. She was worried this might be to low. RN reiterated the blood sugar ranges. Patient wanted something she could put on her refrigerator to remind her.  Plan: RN sent educational material on hypo and hyperglycemia and what to do RN sent 24 hr nurse line magnet for refrigerator RN will follow up outreach in January   Kristen [name] [facility] [phone]

## 2021-04-26 DIAGNOSIS — H26492 Other secondary cataract, left eye: Secondary | ICD-10-CM | POA: Diagnosis not present

## 2021-04-26 DIAGNOSIS — H18421 Band keratopathy, right eye: Secondary | ICD-10-CM | POA: Diagnosis not present

## 2021-04-26 DIAGNOSIS — H04123 Dry eye syndrome of bilateral lacrimal glands: Secondary | ICD-10-CM | POA: Diagnosis not present

## 2021-04-26 DIAGNOSIS — H02831 Dermatochalasis of right upper eyelid: Secondary | ICD-10-CM | POA: Diagnosis not present

## 2021-04-26 DIAGNOSIS — Z961 Presence of intraocular lens: Secondary | ICD-10-CM | POA: Diagnosis not present

## 2021-04-26 DIAGNOSIS — H02834 Dermatochalasis of left upper eyelid: Secondary | ICD-10-CM | POA: Diagnosis not present

## 2021-04-26 DIAGNOSIS — E119 Type 2 diabetes mellitus without complications: Secondary | ICD-10-CM | POA: Diagnosis not present

## 2021-04-26 LAB — HM DIABETES EYE EXAM

## 2021-05-02 ENCOUNTER — Encounter: Payer: Self-pay | Admitting: Family Medicine

## 2021-05-04 ENCOUNTER — Encounter: Payer: Medicare Other | Admitting: Family Medicine

## 2021-05-09 ENCOUNTER — Other Ambulatory Visit: Payer: Self-pay | Admitting: *Deleted

## 2021-05-09 ENCOUNTER — Encounter: Payer: Medicare Other | Admitting: Family Medicine

## 2021-05-09 ENCOUNTER — Telehealth: Payer: Self-pay

## 2021-05-09 NOTE — Patient Instructions (Signed)
Visit Information  Thank you for taking time to visit with me today. Please don't hesitate to contact me if I can be of assistance to you before our next scheduled telephone appointment.  Following are the goals we discussed today:  Current Barriers:  Knowledge Deficits related to plan of care for management of DMII   RNCM Clinical Goal(s):  Patient will verbalize understanding of plan for management of DMII as evidenced by continuation of monitoring blood sugars and adhering to diabetic diet through collaboration with RN Care manager, provider, and care team.   Inter-disciplinary care team collaboration (see longitudinal plan of care) Evaluation of current treatment plan related to  self management and patient's adherence to plan as established by provider  Patient Goals/Self-Care Activities: Take medications as prescribed   Attend all scheduled provider appointments Call pharmacy for medication refills 3-7 days in advance of running out of medications Attend church or other social activities Perform all self care activities independently  Perform IADL's (shopping, preparing meals, housekeeping, managing finances) independently Call provider office for new concerns or questions  call the Suicide and Crisis Lifeline: 988 if experiencing a Mental Health or [place]  check blood sugar at prescribed times: once daily check feet daily for cuts, sores or redness take the blood sugar meter to all doctor visits trim toenails straight across drink 6 to 8 glasses of water each day manage portion size switch to sugar-free drinks wash and dry feet carefully every day    Our next appointment is by telephone on April  Please call the [facility] at [phone] if you need to cancel or reschedule your appointment.   Please call the Suicide and Crisis Lifeline: 988 if you are experiencing a Mental Health or [place] or need someone to talk to.  The patient  verbalized understanding of instructions, educational materials, and care plan provided today and agreed to receive a mailed copy of patient instructions, educational materials, and care plan.   Telephone follow up appointment with care management team member scheduled for: The patient has been provided with contact information for the care management team and has been advised to call with any health related questions or concerns.   [facility] [phone]

## 2021-05-09 NOTE — Patient Outreach (Signed)
[facility]) Care Management [place] Note   05/09/2021 Name:  Kristen [name] MRN:  [id number] DOB:  [birth year]  Summary: Fasting Blood sugar is 155.  Last A1C is 8.0. Per patient she has gotten off her diet during the holidays. Per patient she has gotten back on her diet. Patient next PCP visit is in February.   Recommendations/Changes made from today's visit: Monitor blood sugars as per ordered Monitor diet   Subjective: Kristen [name] is an 81 y.o. year old female who is a primary patient of [name], MD. The care management team was consulted for assistance with care management and/or care coordination needs.    RN Health Coach completed Telephone Visit today.   Objective:  Medications Reviewed Today     Reviewed by Loma Messing, LPN (Licensed Practical Nurse) on 04/05/21 at 1234  Med List Status: <None>   Medication Order Taking? Sig Documenting Provider Last Dose Status Informant  0.9 %  sodium chloride infusion [id number]   Armbruster, Carlota Raspberry, MD  Active   amLODipine (NORVASC) 5 MG tablet [id number] Yes TAKE ONE TABLET BY MOUTH DAILY [name], MD Taking Active   atorvastatin (LIPITOR) 40 MG tablet [id number] Yes TAKE ONE TABLET BY MOUTH ONCE DAILY [name], MD Taking Active   bismuth-metronidazole-tetracycline [facility]) [phone] MG capsule [id number] Yes Take 3 capsules by mouth 4 (four) times daily -  before meals and at bedtime. [name], MD Taking Active   Blood Glucose Monitoring Suppl [facility] VERIO) w/Device Drucie Opitz [id number] Yes Use to check blood sugar up to 4 times a day as directed [name], MD Taking Active   Cholecalciferol (VITAMIN D) 2000 units CAPS [id number] Yes Take 1 capsule by mouth daily. [provider] Taking Active Self           Med Note Gentry Roch   Wed Feb 20, 2018 11:50 AM)    clopidogrel (PLAVIX) 75 MG tablet [id number] Yes Take 1 tablet (75 mg total) by mouth  daily. [name], MD Taking Active   glucose blood [facility] VERIO) test strip [id number] Yes Check blood sugar once daily [name], MD Taking Active   hydrALAZINE (APRESOLINE) 50 MG tablet [id number] Yes Take 1 tablet (50 mg total) by mouth 2 (two) times daily. [name], MD Taking Active   hydrochlorothiazide (HYDRODIURIL) 12.5 MG tablet [id number] Yes Take 12.5 mg by mouth every morning. [provider] Taking Active   Insulin Glargine (BASAGLAR KWIKPEN) 100 UNIT/ML [id number] Yes Inject 18 Units into the skin daily. Gets through PAP [provider] Taking Active Self  Insulin Pen Needle (B-D UF III MINI PEN NEEDLES) 31G X 5 MM MISC [id number] Yes Use once daily [name], MD Taking Active   Lancets [facility] ULTRASOFT) lancets [id number] Yes Use to check blood sugar up to 4 times a day as directed [name], MD Taking Active   losartan (COZAAR) 100 MG tablet [id number] Yes Take 100 mg by mouth every morning. [provider] Taking Active   metoprolol succinate (TOPROL-XL) 25 MG 24 hr tablet [id number] Yes Take 0.5 tablets (12.5 mg total) by mouth daily. [name], MD Taking Active   nitroGLYCERIN (NITROSTAT) 0.4 MG SL tablet [id number] Yes Place 0.4 mg under the tongue every 5 (five) minutes as needed for chest pain. [provider] Taking Active Self           Med Note Mallie Mussel Apr 01, 2020  2:39 PM) Reports  has not needed recently   nystatin cream (MYCOSTATIN) [id number] Yes Apply 1 application topically 2 (two) times daily.  Patient taking differently: Apply 1 application topically 2 (two) times daily. As needed   [name], MD Taking Active   potassium chloride (KLOR-CON) 10 MEQ tablet [id number] Yes Take 1 tablet (10 mEq total) by mouth every Monday, Wednesday, and Friday. [name], MD Taking Active   vitamin B-12 (CYANOCOBALAMIN) 1000 MCG tablet [id number] Yes Take 1 tablet (1,000 mcg  total) by mouth daily. [name], MD Taking Active   Med List Note Kristen [name] 03/16/20 2058): [phone]  [phone]             SDOH:  (Social Determinants of Health) assessments and interventions performed:    Care Plan  Review of patient past medical history, allergies, medications, health status, including review of consultants reports, laboratory and other test data, was performed as part of comprehensive evaluation for care management services.   Care Plan : General Plan of Care (Adult)  Updates made by , Eppie Gibson, RN since 05/09/2021 12:00 AM     Problem: Quality of Life (General Plan of Care) Resolved 05/09/2021  Priority: Medium  Onset Date: 04/08/2020  Note:   [id number] Resolving due to duplicate goal     Long-Range Goal: Quality of Life Maintained Completed 05/09/2021  Start Date: 04/08/2020  Expected End Date: 12/21/2020  Recent Progress: On track  Priority: Medium  Note:   Evidence-based guidance:  Assess patient's thoughts about quality of life, goals and expectations, and dissatisfaction or desire to improve.  Identify issues of primary importance such as mental health, illness, exercise tolerance, pain, sexual function and intimacy, cognitive change, social isolation, finances and relationships.  Assess and monitor for signs/symptoms of psychosocial concerns, especially depression or ideations regarding harm to others or self; provide or refer for mental health services as needed.  Identify sensory issues that impact quality of life such as hearing loss, vision deficit; strategize ways to maintain or improve hearing, vision.  Promote access to services in the community to support independence such as support groups, home visiting programs, financial assistance, handicapped parking tags, durable medical equipment and emergency responder.  Promote activities to decrease social isolation such as group support or social,  leisure and recreational activities, employment, use of social media; consider safety concerns about being out of home for activities.  Provide patient an opportunity to share by storytelling or a "life review" to give positive meaning to life and to assist with coping and negative experiences.  Encourage patient to tap into hope to improve sense of self.  Counsel based on prognosis and as early as possible about end-of-life and palliative care; consider referral to palliative care provider.  Advocate for the development of palliative care plan that may include avoidance of unnecessary testing and intervention, symptom control, discontinuation of medications, hospice and organ donation.  Counsel as early as possible those with life-limiting chronic disease about palliative care; consider referral to palliative care provider.  Advocate for the development of palliative care plan.   Notes:   04/08/20: -- SDOH updated: no concerns identified -- Advanced Directive assessment completed --  discussed need for updated eye exam- encouraged patient to promptly schedule -- [facility] CM initial assessment completed and sent to PCP via secure messaging through EHR  04/21/20: -- goal re-established/ extended today; patient reports very minimal and inconsistent monitoring/ recording of blood sugars due to holiday season busy-ness -- reinforced previously provided education and encouraged  patient to take steps to get back on track- patient verbalizes agreement with same -- discussed with patient her potential plans to move from her single family home into senior apartment complex, where her friend lives: encouraged patient to carefully consider her options and to tour the apartment facility -- confirmed patient continues to have frequent social interactions with family and friends  1/011/22: -- encouraged patient to continue efforts to remain consistent in blood sugar monitoring at home -- discussed her ongoing  plan to begin looking into senior living apartment residents -- confirmed patient has plans to schedule eye exam- this was encouraged    Task: Support and Maintain Acceptable Degree of Health, Comfort and Happiness Completed 05/09/2021  Due Date: 12/21/2020  Note:   Care Management Activities:    - affirmation provided - community involvement promoted - expression of thoughts about present/future encouraged - independence in all possible areas promoted - life review by storytelling encouraged - patient strengths promoted - self-expression encouraged - social relationships promoted - strategies to maintain hearing and/or vision promoted - wellness behaviors promoted    Notes:   04/21/20: -- goal re-established/ extended today; patient reports very minimal and inconsistent monitoring/ recording of blood sugars due to holiday season busy-ness -- reinforced previously provided education and encouraged patient to take steps to get back on track- patient verbalizes agreement with same -- discussed with patient her potential plans to move from her single family home into senior apartment complex, where her friend lives: encouraged patient to carefully consider her options and to tour the apartment facility -- confirmed patient continues to have frequent social interactions with family and friends  1/011/22: -- encouraged patient to continue efforts to remain consistent in blood sugar monitoring at home -- discussed her ongoing plan to begin looking into senior living apartment residents -- confirmed patient has plans to schedule eye exam- this was encouraged    Care Plan : Diabetes Type 2 (Adult)  Updates made by Verlin Grills, RN since 05/09/2021 12:00 AM     Problem: Glycemic Management (Diabetes, Type 2) Resolved 05/09/2021  Priority: Medium  Onset Date: 04/01/2020  Note:   [id number] Resolving due to duplicate goal     Long-Range Goal: Glycemic Management Optimized Completed  05/09/2021  Start Date: 04/01/2020  Expected End Date: 07/21/2021  Recent Progress: On track  Priority: High  Note:   Evidence-based guidance:  Anticipate A1C testing (point-of-care) every 3 to 6 months based on goal attainment.  Review mutually-set A1C goal or target range.  Anticipate use of antihyperglycemic with or without insulin and periodic adjustments; consider active involvement of pharmacist.  Provide medical nutrition therapy and development of individualized eating.  Compare self-reported symptoms of hypo or hyperglycemia to blood glucose levels, diet and fluid intake, current medications, psychosocial and physiologic stressors, change in activity and barriers to care adherence.  [id number] A1C is 8.0 down from 10.7 Promote self-monitoring of blood glucose levels.  Assess and address barriers to management plan, such as food insecurity, age, developmental ability, depression, anxiety, fear of hypoglycemia or weight gain, as well as medication cost, side effects and complicated regimen.  Consider referral to community-based diabetes education program, visiting nurse, community health worker or health coach.  Encourage regular dental care for treatment of periodontal disease; refer to dental provider when needed.   Notes:   04/08/20: -- Confirmed patient received printed educational material mailed to her 04/01/20: encouraged her to read and review regularly; patient will need ongoing reinforcement -- reinforced need  to monitor blood pressures and blood sugars at home as advised: BP daily; blood sugars fasting and 2-hours post-prandial -- confirmed that patient has Upstream Pharmacist at PCP office involved in care; confirmed that she is able to verbalize accurate understanding of prescribed insulin dosing -- discussed need for updated eye exam- encouraged patient to promptly schedule   04/21/20: -- Goal extended around possible upcoming transfer back to [place]; patient  reports very minimal and inconsistent monitoring/ recording of blood sugars due to holiday season busy-ness -- reinforced previously provided education and encouraged patient to take steps to get back on track- patient verbalizes agreement with same -- encouraged patient to begin monitoring/ recording blood sugars twice daily for 2 weeks; discussed value of both fasting and 2-hour post-prandial values -- discussed importance of portion control especially during holiday times -- confirmed patient has continues following diabetic diet and has been limiting carbohydrates  05/04/20: -- confirmed patient diligently monitored/ recorded blood sugars at home after our last outreach- reviewed with patient today -- reports fasting blood sugars between 107-194 over last 2 weeks; between 107-171 over last one week -- reports post-prandial blood sugar readings between 136- 266 over last 2 weeks; between 136-219 over last one week -- confirmed no recent hypoglycemic episodes -- confirmed patient remains able to verbalize accurate dosing of insulin and is taking as instructed -- confirmed patient feels "back on track" with diet now that holiday season is over- positive reinforcement provided; encouraged patient's ongoing adherence to low sugar/ low carb diet; reinforced previously provided education around same: patient will continue to require ongoing reinforcement -- confirmed accurate understanding of upcoming provider appointments; patient continues to drive self to provider appointments -- Education provided around signs/ symptoms MI/ CVA, along with corresponding action plan for same     Task: Alleviate Barriers to Glycemic Management Completed 05/09/2021  Due Date: 07/21/2021  Note:   Care Management Activities:    - barriers to adherence to treatment plan identified - blood glucose monitoring encouraged - blood glucose readings reviewed - individualized medical nutrition therapy provided - mutual  A1C goal set or reviewed - self-awareness of signs/symptoms of hypo or hyperglycemia encouraged - use of blood glucose monitoring log promoted    Notes:  [id number] RN discussed with patient to monitor blood sugars more frequently  04/21/20: -- Goal extended around possible upcoming transfer back to [place]; patient reports very minimal and inconsistent monitoring/ recording of blood sugars due to holiday season busy-ness -- reinforced previously provided education and encouraged patient to take steps to get back on track- patient verbalizes agreement with same -- encouraged patient to begin monitoring/ recording blood sugars twice daily for 2 weeks; discussed value of both fasting and 2-hour post-prandial values -- discussed importance of portion control especially during holiday times -- confirmed patient has continues following diabetic diet and has been limiting carbohydrates  04/01/20: -- care coordination outreach completed to PCP to facilitate patient obtaining supplies for insulin administration -- education provided to patient around signs/ symptoms hyperglycemia, corresponding action plan for same -- educated patient on importance of ongoing monitoring of blood sugars at home  [id number] per patient she needed a new meter and strips. She had not been checking her blood sugars    Problem: Disease Progression (Diabetes, Type 2) Resolved 05/09/2021  Priority: Medium  Onset Date: 06/08/2020  Note:   [id number] Resolving due to duplicate goal     Goal: Disease Progression Prevented or Minimized Completed 05/09/2021  Start Date: 06/08/2020  Expected End Date: 07/21/2021  Recent Progress: On track  Priority: Medium  Note:   Evidence-based guidance:  Prepare patient for laboratory and diagnostic exams based on risk and presentation.  Encourage lifestyle changes, such as increased intake of plant-based foods, stress reduction, consistent physical activity and smoking cessation  to prevent long-term complications and chronic disease.   Individualize activity and exercise recommendations while considering potential limitations, such as neuropathy, retinopathy or the ability to prevent hyperglycemia or hypoglycemia.   Prepare patient for use of pharmacologic therapy that may include antihypertensive, analgesic, prostaglandin E1 with periodic adjustments, based on presenting chronic condition and laboratory results.  Assess signs/symptoms and risk factors for hypertension, sleep-disordered breathing, neuropathy (including changes in gait and balance), retinopathy, nephropathy and sexual dysfunction.  Ensure completion of annual comprehensive foot exam and dilated eye exam.   Implement additional individualized goals and interventions based on identified risk factors.  Prepare patient for consultation or referral for specialist care, such as ophthalmology, neurology, cardiology, podiatry, nephrology or perinatology.   Notes:  [id number] A1C has decreased    Care Plan : RN Care Manager Plan of Care  Updates made by , Eppie Gibson, RN since 05/09/2021 12:00 AM     Problem: Knowledge Deficit Related to Diabetes and Care Coordination Needs   Priority: High     Long-Range Goal: Development Plan of Care for Management of Diabetes   Start Date: 05/09/2021  Expected End Date: 04/22/2022  Priority: High  Note:   Current Barriers:  Knowledge Deficits related to plan of care for management of DMII   RNCM Clinical Goal(s):  Patient will verbalize understanding of plan for management of DMII as evidenced by continuation of monitoring blood sugars and adhering to diabetic diet  through collaboration with RN Care manager, provider, and care team.   Inter-disciplinary care team collaboration (see longitudinal plan of care) Evaluation of current treatment plan related to  self management and patient's adherence to plan as established by provider  Patient Goals/Self-Care  Activities: Take medications as prescribed   Attend all scheduled provider appointments Call pharmacy for medication refills 3-7 days in advance of running out of medications Attend church or other social activities Perform all self care activities independently  Perform IADL's (shopping, preparing meals, housekeeping, managing finances) independently Call provider office for new concerns or questions  call the Suicide and Crisis Lifeline: 988 if experiencing a Mental Health or [place]  check blood sugar at prescribed times: once daily check feet daily for cuts, sores or redness take the blood sugar meter to all doctor visits trim toenails straight across drink 6 to 8 glasses of water each day manage portion size switch to sugar-free drinks wash and dry feet carefully every day        Plan: Telephone follow up appointment with care management team member scheduled for:  August 08, 2021 The patient has been provided with contact information for the care management team and has been advised to call with any health related questions or concerns.   [place] Care Management [phone]

## 2021-05-09 NOTE — Chronic Care Management (AMB) (Addendum)
° ° °  Chronic Care Management Pharmacy Assistant   Name: Kristen [name]  MRN: [id number] DOB: 1940-10-22  Reason for Encounter: CCM Reminder Call  Medications: Outpatient Encounter Medications as of 05/09/2021  Medication Sig Note   amLODipine (NORVASC) 5 MG tablet TAKE ONE TABLET BY MOUTH DAILY    atorvastatin (LIPITOR) 40 MG tablet TAKE ONE TABLET BY MOUTH ONCE DAILY    bismuth-metronidazole-tetracycline (PYLERA) 140-125-125 MG capsule Take 3 capsules by mouth 4 (four) times daily -  before meals and at bedtime.    Blood Glucose Monitoring Suppl (ONETOUCH VERIO) w/Device KIT Use to check blood sugar up to 4 times a day as directed    Cholecalciferol (VITAMIN D) 2000 units CAPS Take 1 capsule by mouth daily.    clopidogrel (PLAVIX) 75 MG tablet Take 1 tablet (75 mg total) by mouth daily.    glucose blood (ONETOUCH VERIO) test strip Check blood sugar once daily    hydrALAZINE (APRESOLINE) 50 MG tablet Take 1 tablet (50 mg total) by mouth 2 (two) times daily.    hydrochlorothiazide (HYDRODIURIL) 12.5 MG tablet Take 12.5 mg by mouth every morning.    Insulin Glargine (BASAGLAR KWIKPEN) 100 UNIT/ML Inject 18 Units into the skin daily. Gets through PAP    Insulin Pen Needle (B-D UF III MINI PEN NEEDLES) 31G X 5 MM MISC Use once daily    Lancets (ONETOUCH ULTRASOFT) lancets Use to check blood sugar up to 4 times a day as directed    losartan (COZAAR) 100 MG tablet Take 100 mg by mouth every morning.    metoprolol succinate (TOPROL-XL) 25 MG 24 hr tablet Take 0.5 tablets (12.5 mg total) by mouth daily.    nitroGLYCERIN (NITROSTAT) 0.4 MG SL tablet Place 0.4 mg under the tongue every 5 (five) minutes as needed for chest pain. 04/01/2020: Reports has not needed recently    nystatin cream (MYCOSTATIN) Apply 1 application topically 2 (two) times daily. (Patient taking differently: Apply 1 application topically 2 (two) times daily. As needed)    potassium chloride (KLOR-CON) 10 MEQ tablet Take 1 tablet  (10 mEq total) by mouth every Monday, Wednesday, and Friday.    vitamin B-12 (CYANOCOBALAMIN) 1000 MCG tablet Take 1 tablet (1,000 mcg total) by mouth daily.    Facility-Administered Encounter Medications as of 05/09/2021  Medication   0.9 %  sodium chloride infusion   [name] was contacted to remind of upcoming telephone visit with [name] on 05/16/21 at 9:30am. Patient was reminded to have all medications, supplements and any blood glucose and blood pressure readings available for review at appointment. If unable to reach, a voicemail was left for patient.    Are you having any problems with your medications? No   Do you have any concerns you like to discuss with the pharmacist? No  Star Rating Drugs: Medication:  Last Fill: Day Supply Atorvastatin 40mg  03/07/21 90 Losartan 100mg  03/07/21 [place]  [name], CPP notified  Avel Sensor, [place] Assistant [phone]  I have reviewed the care management and care coordination activities outlined in this encounter and I am certifying that I agree with the content of this note. No further action required.  [name], PharmD Clinical Pharmacist [facility] at [facility] [phone]

## 2021-05-16 ENCOUNTER — Ambulatory Visit (INDEPENDENT_AMBULATORY_CARE_PROVIDER_SITE_OTHER): Payer: Medicare Other

## 2021-05-16 ENCOUNTER — Other Ambulatory Visit: Payer: Self-pay

## 2021-05-16 DIAGNOSIS — Z794 Long term (current) use of insulin: Secondary | ICD-10-CM

## 2021-05-16 DIAGNOSIS — E1165 Type 2 diabetes mellitus with hyperglycemia: Secondary | ICD-10-CM

## 2021-05-16 DIAGNOSIS — I10 Essential (primary) hypertension: Secondary | ICD-10-CM

## 2021-05-16 DIAGNOSIS — E1169 Type 2 diabetes mellitus with other specified complication: Secondary | ICD-10-CM

## 2021-05-16 NOTE — Telephone Encounter (Signed)
Just to close

## 2021-05-16 NOTE — Patient Instructions (Addendum)
Dear Kristen [name],  Below is a summary of the goals we discussed during our follow up appointment on May 16, 2021. Please contact me anytime with questions or concerns.   Visit Information Patient Care Plan: CCM Pharmacy Care Plan     Problem Identified: CHL AMB "PATIENT-SPECIFIC PROBLEM"      Long-Range Goal: Disease Management   Start Date: 06/25/2020  This Visit's Progress: On track  Priority: High  Note:    Current Barriers:  None identified  Pharmacist Clinical Goal(s):  Patient will adhere to prescribed medication regimen as evidenced by patient report and refill history through collaboration with PharmD and provider.   Interventions: 1:1 collaboration with [name], MD regarding development and update of comprehensive plan of care as evidenced by provider attestation and co-signature Inter-disciplinary care team collaboration (see longitudinal plan of care) Comprehensive medication review performed; medication list updated in electronic medical record  Hypertension (BP goal <140/90) -Controlled, last clinic reading 136/68. Pt reports feeling better on 1/2 tablet metoprolol (less fatigue). This was reduced by PCP Sept 2022. Her granddaughter checks her BP occasionally at home and pt reports it was OK. She does not have a log. Suggested keeping a log prior to PCP appt early February. -Current treatment: Hydralazine 50 mg - 1 tablet BID  Amlodipine 5 mg - 1 tablet daily Metoprolol Succinate 25 mg - 1/2 tablet daily (12.5 mg) Losartan 12.5 mg  - 1 tablet daily HCTZ 100 mg - 1 tablet daily Potassium Chloride 10 mEq - 1 tablet every Monday, Wednesday, Friday -Medications previously tried: none   -Current home readings: none reported -She has an automatic arm cuff. -Denies hypotensive/hypertensive symptoms -Diet: She limits pork and salty foods.  -Counseled to monitor BP at home 2-3 days per week, document, and provide log at future appointments -Recommended  to continue current medication; Bring BP monitor in to PCP appointment.  Hyperlipidemia: (LDL goal < 70 - CAD) -Controlled, LDL 69 -Significant ASCVD - history of CVA, MI  -Current treatment: Atorvastatin 40 mg - 1 tablet daily  -Medications previously tried: none -Recommended to continue current medication  Diabetes (A1c goal 7.5-8 %) -Not ideally controlled, A1c 8% 12/2020 -Reviewed chronic health conditions, risk of hypoglycemia, ability to complete ADLs. She denies falls. She completes ADLs without issues. She reports some memory impairment. She denies any hypoglycemia. Lowest BG was 90. She has multiple chronic conditions. She is adherent to [place] but unclear why Januvia was stopped this year. She does not recall. Due to above factors, A1c goal of 7.5-8% is reasonable. -Current medications: Basaglar - Inject 18 units daily (confirms bedtime dosing) -Medications previously tried: metformin intolerance, Januvia (She was getting though PAP in 2022, she is not sure why she stopped) -Current home glucose readings: fasting glucose: 112, 117, 125, 114 bedtime: occasionally above 200 (213) after meals, usually 160-170s -Denies hypoglycemic/hyperglycemic symptoms - Lowest sugar 90 (she felt this was too low) -Eye and foot exam are up to date. -Recommended to continue current medication; We could consider resuming Januvia if A1C remains elevated.  Other - Fatigue  She affirms taking a OTC B12 1000 mcg supplement, this has improved fatigue.  Patient Goals/Self-Care Activities Patient will:  - continue to focus on medication adherence by utilizing pill packaging - contact CCM team with health concerns  Follow Up Plan: -CCM PharmD appointment, 6 months  -CCM health concierge for adherence every 90 days (med coordination and delivery) - review BG and BP logs at this time  Patient verbalizes understanding of instructions and care plan provided today and agrees to view in [place].  Active MyChart status confirmed with patient.    [name], PharmD Clinical Pharmacist Practitioner [facility] at [facility] [phone]

## 2021-05-16 NOTE — Progress Notes (Signed)
Chronic Care Management Pharmacy Note  05/16/2021 Name:  Kristen [name] MRN:  [id number] DOB:  28-Feb-1941  Subjective: Kristen [name] is an 81 y.o. year old female who is a primary patient of [name], MD.  The CCM team was consulted for assistance with disease management and care coordination needs.    Engaged with patient by telephone for follow up visit in response to provider referral for pharmacy case management and/or care coordination services.   Consent to Services:  The patient was given information about Chronic Care Management services, agreed to services, and gave verbal consent prior to initiation of services.  Please see initial visit note for detailed documentation.   Patient Care Team: [name], MD as PCP - General (Family Medicine) Rockey Situ Kathlene November, MD as Consulting Physician (Cardiology) [name], [facility] as Pharmacist (Pharmacist) Pleasant, Eppie Gibson, RN as [place] Management  Recent office visits: 01/05/21 - [name], PCP - Pt presented for diabetes, HTN, follow up visit. Pt feels fatigued contributes to medications. Will trial lowe dose Toprol XL to start. Diabetes, chronic, improved with regular Basaglar use (PAP). Seems she is off Januvia. Foot exam today, will see if she can have diabetic eye exam here. Labs: B12, TSH, BMP, CMP. Start B12 1000 mcg daily.  Recent consult visits: 01/20/21 - Podiatry - Pt presented for diabetic foot care, toe pain.  Hospital visits: None in previous 6 months  Objective:  Lab Results  Component Value Date   CREATININE 1.13 01/05/2021   BUN 27 (H) 01/05/2021   GFR 46.30 (L) 01/05/2021   GFRNONAA 41 (L) 03/19/2020   GFRAA >60 02/20/2018   NA 142 01/05/2021   K 3.8 01/05/2021   CALCIUM 9.6 01/05/2021   CO2 30 01/05/2021    Lab Results  Component Value Date/Time   HGBA1C 8.0 (A) 01/05/2021 11:15 AM   HGBA1C 10.7 (H) 03/16/2020 05:37 AM   HGBA1C 7.6 (A) 09/25/2019  10:07 AM   HGBA1C 8.2 (H) 03/24/2019 09:57 AM   FRUCTOSAMINE 497 (H) 03/30/2020 10:36 AM   GFR 46.30 (L) 01/05/2021 11:45 AM   GFR 53.90 (L) 04/07/2020 11:08 AM   MICROALBUR 7.8 (H) 08/09/2016 10:28 AM   MICROALBUR 2.6 (H) 12/08/2013 10:31 AM    Last diabetic Eye exam:  Lab Results  Component Value Date/Time   HMDIABEYEEXA No Retinopathy 04/26/2021 12:00 AM    Last diabetic Foot exam: 01/05/2021   Lab Results  Component Value Date   CHOL 137 03/30/2020   HDL 45.70 03/30/2020   LDLCALC 69 03/30/2020   LDLDIRECT 102.0 12/31/2015   TRIG 108.0 03/30/2020   CHOLHDL 3 03/30/2020    Hepatic Function Latest Ref Rng & Units 03/30/2020 03/15/2020 09/25/2019  Total Protein 6.0 - 8.3 g/dL 7.1 6.7 -  Albumin 3.5 - 5.2 g/dL 4.0 3.3(L) 3.7  AST 0 - 37 U/L 15 16 -  ALT 0 - 35 U/L 21 17 -  Alk Phosphatase 39 - 117 U/L 117 88 -  Total Bilirubin 0.2 - 1.2 mg/dL 1.0 0.8 -  Bilirubin, Direct 0.1 - 0.5 mg/dL - - -    Lab Results  Component Value Date/Time   TSH 3.71 01/05/2021 11:45 AM   TSH 3.49 03/30/2020 10:36 AM   FREET4 0.90 01/05/2021 11:45 AM   FREET4 0.98 03/30/2020 10:36 AM    CBC Latest Ref Rng & Units 01/05/2021 03/30/2020 03/19/2020  WBC 4.0 - 10.5 K/uL 8.4 9.0 7.1  Hemoglobin 12.0 - 15.0 g/dL 11.1(L) 12.2  11.4(L)  Hematocrit 36.0 - 46.0 % 35.2(L) 38.4 36.3  Platelets 150.0 - 400.0 K/uL 247.0 237.0 214   Lab Results  Component Value Date/Time   VD25OH 29.48 (L) 03/30/2020 10:36 AM   VD25OH 32.24 03/24/2019 09:57 AM   Clinical ASCVD: Yes  The 10-year ASCVD risk score (Arnett DK, et al., 2019) is: 27.8%   Values used to calculate the score:     Age: 53 years     Sex: Female     Is Non-Hispanic African American: Yes     Diabetic: Yes     Tobacco smoker: No     Systolic Blood Pressure: 638 mmHg     Is BP treated: Yes     HDL Cholesterol: 45.7 mg/dL     Total Cholesterol: 137 mg/dL    Depression screen [facility] 2/9 04/05/2021 04/08/2020 03/31/2020  Decreased Interest 0 0 0   Down, Depressed, Hopeless 0 0 0  PHQ - 2 Score 0 0 0  Altered sleeping - - 0  Tired, decreased energy - - 0  Change in appetite - - 0  Feeling bad or failure about yourself  - - 0  Trouble concentrating - - 0  Moving slowly or fidgety/restless - - 0  Suicidal thoughts - - 0  PHQ-9 Score - - 0  Difficult doing work/chores - - Not difficult at all  Some recent data might be hidden    Social History   Tobacco Use  Smoking Status Never  Smokeless Tobacco Never  Tobacco Comments   tobacco use- no    BP Readings from Last 3 Encounters:  01/05/21 136/68  08/16/20 (!) 161/79  07/07/20 120/66   Pulse Readings from Last 3 Encounters:  01/05/21 62  08/16/20 (!) 57  07/07/20 (!) 58   Wt Readings from Last 3 Encounters:  04/05/21 192 lb (87.1 kg)  01/05/21 192 lb 1 oz (87.1 kg)  08/16/20 192 lb (87.1 kg)    Assessment/Interventions: Review of patient past medical history, allergies, medications, health status, including review of consultants reports, laboratory and other test data, was performed as part of comprehensive evaluation and provision of chronic care management services.   SDOH:  (Social Determinants of Health) assessments and interventions performed: Yes    CCM Care Plan  Allergies  Allergen Reactions   Bee Venom Anaphylaxis   Peanut-Containing Drug Products Itching   Acetaminophen Other (See Comments)    "Causes me to spit up blood"   Aleve [Naproxen Sodium] Other (See Comments)    Spits up blood   Doxycycline Other (See Comments)    Malaise, GI upset, "felt drunk" and very ill   Metformin And Related Other (See Comments)    Chills, dizziness   Penicillins Rash    Has patient had a PCN reaction causing immediate rash, facial/tongue/throat swelling, SOB or lightheadedness with hypotension: Yes Has patient had a PCN reaction causing severe rash involving mucus membranes or skin necrosis: No Has patient had a PCN reaction that required hospitalization No Has  patient had a PCN reaction occurring within the last 10 years: No If all of the above answers are "NO", then may proceed with Cephalosporin use.     Medications Reviewed Today     Reviewed by [name], [facility] (Pharmacist) on 05/16/21 at 1013  Med List Status: <None>   Medication Order Taking? Sig Documenting Provider Last Dose Status Informant  0.9 %  sodium chloride infusion [id number]   Armbruster, Carlota Raspberry, MD  Active   amLODipine (  NORVASC) 5 MG tablet [id number] Yes TAKE ONE TABLET BY MOUTH DAILY [name], MD Taking Active   atorvastatin (LIPITOR) 40 MG tablet [id number] Yes TAKE ONE TABLET BY MOUTH ONCE DAILY [name], MD Taking Active   bismuth-metronidazole-tetracycline [facility]) [phone] MG capsule [id number]  Take 3 capsules by mouth 4 (four) times daily -  before meals and at bedtime. [name], MD  Active   Blood Glucose Monitoring Suppl [facility] VERIO) w/Device Drucie Opitz [id number] Yes Use to check blood sugar up to 4 times a day as directed [name], MD Taking Active   Cholecalciferol (VITAMIN D) 2000 units CAPS [id number] Yes Take 1 capsule by mouth daily. [provider] Taking Active Self           Med Note Gentry Roch   Wed Feb 20, 2018 11:50 AM)    clopidogrel (PLAVIX) 75 MG tablet [id number] Yes Take 1 tablet (75 mg total) by mouth daily. [name], MD Taking Active   glucose blood Self [facility] VERIO) test strip [id number] Yes Check blood sugar once daily [name], MD Taking Active   hydrALAZINE (APRESOLINE) 50 MG tablet [id number] Yes Take 1 tablet (50 mg total) by mouth 2 (two) times daily. [name], MD Taking Active   hydrochlorothiazide (HYDRODIURIL) 12.5 MG tablet [id number] Yes Take 12.5 mg by mouth every morning. [provider] Taking Active   Insulin Glargine (BASAGLAR KWIKPEN) 100 UNIT/ML [id number]  Inject 18 Units into the skin daily. Gets through PAP [provider]  Active Self   Insulin Pen Needle (B-D UF III MINI PEN NEEDLES) 31G X 5 MM MISC [id number] Yes Use once daily [name], MD Taking Active   Lancets [facility] ULTRASOFT) lancets [id number] Yes Use to check blood sugar up to 4 times a day as directed [name], MD Taking Active   losartan (COZAAR) 100 MG tablet [id number]  Take 100 mg by mouth every morning. [provider]  Active   metoprolol succinate (TOPROL-XL) 25 MG 24 hr tablet [id number] Yes Take 0.5 tablets (12.5 mg total) by mouth daily. [name], MD Taking Active   nitroGLYCERIN (NITROSTAT) 0.4 MG SL tablet [id number] Yes Place 0.4 mg under the tongue every 5 (five) minutes as needed for chest pain. [provider] Taking Active Self           Med Note Mallie Mussel Apr 01, 2020  2:39 PM) Reports has not needed recently   nystatin cream (MYCOSTATIN) [id number] Yes Apply 1 application topically 2 (two) times daily.  Patient taking differently: Apply 1 application topically 2 (two) times daily. As needed   [name], MD Taking Active   potassium chloride (KLOR-CON) 10 MEQ tablet [id number] Yes Take 1 tablet (10 mEq total) by mouth every Monday, Wednesday, and Friday. [name], MD Taking Active   vitamin B-12 (CYANOCOBALAMIN) 1000 MCG tablet [id number] Yes Take 1 tablet (1,000 mcg total) by mouth daily. 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, left eye ([place]) 01/21/2021   Low serum vitamin B12 01/10/2021   Fatigue 01/05/2021   Chronic kidney disease, stage 3a ([place]) 03/18/2020   Hypertensive urgency 03/16/2020   Hip osteoarthritis 03/11/2019   Pain due to onychomycosis of toenails of both feet  02/10/2019   Type 2 diabetes mellitus with  vascular disease ([place]) 02/10/2019   Polypharmacy 02/04/2018   Urinary urgency 10/29/2017   Seizure ([place]) 09/11/2017   Aphasia    Borderline hypothyroidism 03/12/2017   Disturbance of skin sensation 09/21/2016   Hemiparesis of right dominant side ([place]) 09/02/2016   Intertrigo 08/16/2016   Vitamin D deficiency 08/08/2016   Gout 03/24/2016   Syncope 02/29/2016   Concussion 02/29/2016   Arthritis of left wrist 02/07/2016   History of CVA (cerebrovascular accident) 11/03/2015   Aortic atherosclerosis ([place])    Anemia 06/18/2015   Sensorineural hearing loss (SNHL) of both ears 03/29/2015   Pain of right middle finger 12/22/2014   Memory deficit 12/04/2014   Primary osteoarthritis of left knee 01/27/2014   Advanced care planning/counseling discussion 12/11/2013   Medicare annual wellness visit, subsequent 12/11/2013   Health maintenance examination 12/11/2013   CAD (coronary artery disease)/coronary calcifications    Chest pain 07/17/2013   Benign paroxysmal positional vertigo 05/26/2013   Hyperlipidemia associated with type 2 diabetes mellitus (HCC)    Severe obesity (BMI 35.0-39.9) with comorbidity ([place])    History of pulmonary embolism    Osteoarthritis    Diabetes mellitus type 2, uncontrolled, with complications    History of DVT (deep vein thrombosis) 12/21/2010   Resistant hypertension 05/13/2009    Immunization History  Administered Date(s) Administered   Fluad Quad(high Dose 65+) 03/30/2020   PFIZER Comirnaty([place])Covid-19 Tri-Sucrose Vaccine 06/22/2020, 07/13/2020   PPD Test 09/25/2014   Pneumococcal Conjugate-13 09/10/2013   Pneumococcal Polysaccharide-23 03/10/2015   Zoster, Live 04/21/2014    Conditions to be addressed/monitored:  Hypertension, Hyperlipidemia, Diabetes, Coronary Artery Disease and Chronic Kidney Disease  Care Plan : [place]  Updates made by [name], [place] since 05/16/2021 12:00 AM     Problem: CHL AMB "PATIENT-SPECIFIC  PROBLEM"      Long-Range Goal: Disease Management   Start Date: 06/25/2020  This Visit's Progress: On track  Priority: High  Note:    Current Barriers:  None identified  Pharmacist Clinical Goal(s):  Patient will adhere to prescribed medication regimen as evidenced by patient report and refill history through collaboration with PharmD and provider.   Interventions: 1:1 collaboration with [name], MD regarding development and update of comprehensive plan of care as evidenced by provider attestation and co-signature Inter-disciplinary care team collaboration (see longitudinal plan of care) Comprehensive medication review performed; medication list updated in electronic medical record  Hypertension (BP goal <140/90) -Controlled, last clinic reading 136/68. Pt reports feeling better on 1/2 tablet metoprolol (less fatigue). This was reduced by PCP Sept 2022. Her granddaughter checks her BP occasionally at home and pt reports it was OK. She does not have a log. Suggested keeping a log prior to PCP appt early February. -Current treatment: Hydralazine 50 mg - 1 tablet BID  Amlodipine 5 mg - 1 tablet daily Metoprolol Succinate 25 mg - 1/2 tablet daily (12.5 mg) Losartan 12.5 mg  - 1 tablet daily HCTZ 100 mg - 1 tablet daily Potassium Chloride 10 mEq - 1 tablet every Monday, Wednesday, Friday -Medications previously tried: none   -Current home readings: none reported -She has an automatic arm cuff. -Denies hypotensive/hypertensive symptoms -Diet: She limits pork and salty foods.  -Counseled to monitor BP at home 2-3 days per week, document, and provide log at future appointments -Recommended to continue current medication; Bring BP monitor in to PCP appointment.  Hyperlipidemia: (LDL goal < 70 - CAD) -Controlled, LDL  25 -Significant ASCVD - history of CVA, MI  -Current treatment: Atorvastatin 40 mg - 1 tablet daily  -Medications previously tried: none -Recommended to continue  current medication  Diabetes (A1c goal 7.5-8 %) -Not ideally controlled, A1c 8% 12/2020 -Reviewed chronic health conditions, risk of hypoglycemia, ability to complete ADLs. She denies falls. She completes ADLs without issues. She reports some memory impairment. She denies any hypoglycemia. Lowest BG was 90. She has multiple chronic conditions. She is adherent to [place] but unclear why Januvia was stopped this year. She does not recall. Due to above factors, A1c goal of 7.5-8% is reasonable. -Current medications: Basaglar - Inject 18 units daily (confirms bedtime dosing) -Medications previously tried: metformin intolerance, Januvia (She was getting though PAP in 2022, she is not sure why she stopped) -Current home glucose readings: fasting glucose: 112, 117, 125, 114 bedtime: occasionally above 200 (213) after meals, usually 160-170s -Denies hypoglycemic/hyperglycemic symptoms - Lowest sugar 90 (she felt this was too low) -Eye and foot exam are up to date. -Recommended to continue current medication; We could consider resuming Januvia if A1C remains elevated.  Other - Fatigue  She affirms taking a OTC B12 1000 mcg supplement, this has improved fatigue.  Patient Goals/Self-Care Activities Patient will:  - continue to focus on medication adherence by utilizing pill packaging - contact CCM team with health concerns  Follow Up Plan: -CCM PharmD appointment, 6 months  -CCM health concierge for adherence every 90 days (med coordination and delivery) - review BG and BP logs at this time     Medication Assistance: Basaglar obtained through medication assistance program.  Enrollment ends end of 2022. Patient is unsure if she completed 2023 enrollment. CCM team will check on status.  Compliance/Adherence/Medication fill history: Care Gaps: None  Star Medications: Medication:                Last Fill:         Day Supply Atorvastatin 59m       03/07/21          90 Losartan 1036m          03/07/21          [place]  Patient's preferred pharmacy is:  [facility] [place] [address] [place]: [phone]: [phone] uses adherence packaging.  Pt endorses 100% compliance. However, she has held metoprolol some days due to fatigue. We discussed importance of not missing doses of this medication due to risk of rebound tachycardia, HTN, and MI.  Care Plan and Follow Up Patient Decision:  Patient agrees to Care Plan and Follow-up.  MiDebbora DusPharmD Clinical Pharmacist [facility] [phone]

## 2021-05-24 DIAGNOSIS — E785 Hyperlipidemia, unspecified: Secondary | ICD-10-CM | POA: Diagnosis not present

## 2021-05-24 DIAGNOSIS — E1169 Type 2 diabetes mellitus with other specified complication: Secondary | ICD-10-CM

## 2021-05-24 DIAGNOSIS — I10 Essential (primary) hypertension: Secondary | ICD-10-CM

## 2021-05-24 DIAGNOSIS — Z794 Long term (current) use of insulin: Secondary | ICD-10-CM | POA: Diagnosis not present

## 2021-05-24 DIAGNOSIS — E1165 Type 2 diabetes mellitus with hyperglycemia: Secondary | ICD-10-CM

## 2021-05-27 ENCOUNTER — Other Ambulatory Visit: Payer: Self-pay | Admitting: Family Medicine

## 2021-05-27 DIAGNOSIS — E1165 Type 2 diabetes mellitus with hyperglycemia: Secondary | ICD-10-CM

## 2021-05-30 ENCOUNTER — Ambulatory Visit (INDEPENDENT_AMBULATORY_CARE_PROVIDER_SITE_OTHER): Payer: Medicare Other | Admitting: Family Medicine

## 2021-05-30 ENCOUNTER — Encounter: Payer: Self-pay | Admitting: Family Medicine

## 2021-05-30 ENCOUNTER — Telehealth: Payer: Self-pay

## 2021-05-30 ENCOUNTER — Emergency Department (HOSPITAL_COMMUNITY)
Admission: EM | Admit: 2021-05-30 | Discharge: 2021-05-31 | Disposition: A | Discharge status: Home / Self Care | Payer: Medicare Other | Attending: Emergency Medicine | Admitting: Emergency Medicine

## 2021-05-30 ENCOUNTER — Other Ambulatory Visit: Payer: Self-pay

## 2021-05-30 VITALS — BP 166/80 | HR 68 | Temp 97.7°F | Ht 61.75 in | Wt 193.4 lb

## 2021-05-30 DIAGNOSIS — I10 Essential (primary) hypertension: Secondary | ICD-10-CM | POA: Diagnosis not present

## 2021-05-30 DIAGNOSIS — E119 Type 2 diabetes mellitus without complications: Secondary | ICD-10-CM | POA: Diagnosis not present

## 2021-05-30 DIAGNOSIS — I2510 Atherosclerotic heart disease of native coronary artery without angina pectoris: Secondary | ICD-10-CM | POA: Insufficient documentation

## 2021-05-30 DIAGNOSIS — E559 Vitamin D deficiency, unspecified: Secondary | ICD-10-CM

## 2021-05-30 DIAGNOSIS — Z794 Long term (current) use of insulin: Secondary | ICD-10-CM | POA: Diagnosis not present

## 2021-05-30 DIAGNOSIS — G4700 Insomnia, unspecified: Secondary | ICD-10-CM | POA: Insufficient documentation

## 2021-05-30 DIAGNOSIS — Z91010 Allergy to peanuts: Secondary | ICD-10-CM | POA: Insufficient documentation

## 2021-05-30 DIAGNOSIS — I69351 Hemiplegia and hemiparesis following cerebral infarction affecting right dominant side: Secondary | ICD-10-CM

## 2021-05-30 DIAGNOSIS — Z7984 Long term (current) use of oral hypoglycemic drugs: Secondary | ICD-10-CM | POA: Diagnosis not present

## 2021-05-30 DIAGNOSIS — E1165 Type 2 diabetes mellitus with hyperglycemia: Secondary | ICD-10-CM | POA: Diagnosis not present

## 2021-05-30 DIAGNOSIS — H903 Sensorineural hearing loss, bilateral: Secondary | ICD-10-CM | POA: Diagnosis not present

## 2021-05-30 DIAGNOSIS — Z79899 Other long term (current) drug therapy: Secondary | ICD-10-CM | POA: Insufficient documentation

## 2021-05-30 DIAGNOSIS — E538 Deficiency of other specified B group vitamins: Secondary | ICD-10-CM

## 2021-05-30 DIAGNOSIS — I700 Atherosclerosis of aorta: Secondary | ICD-10-CM

## 2021-05-30 DIAGNOSIS — E785 Hyperlipidemia, unspecified: Secondary | ICD-10-CM | POA: Diagnosis not present

## 2021-05-30 DIAGNOSIS — R109 Unspecified abdominal pain: Secondary | ICD-10-CM | POA: Diagnosis not present

## 2021-05-30 DIAGNOSIS — E1169 Type 2 diabetes mellitus with other specified complication: Secondary | ICD-10-CM

## 2021-05-30 DIAGNOSIS — E113392 Type 2 diabetes mellitus with moderate nonproliferative diabetic retinopathy without macular edema, left eye: Secondary | ICD-10-CM

## 2021-05-30 DIAGNOSIS — K760 Fatty (change of) liver, not elsewhere classified: Secondary | ICD-10-CM | POA: Diagnosis not present

## 2021-05-30 DIAGNOSIS — Z8673 Personal history of transient ischemic attack (TIA), and cerebral infarction without residual deficits: Secondary | ICD-10-CM

## 2021-05-30 DIAGNOSIS — M1611 Unilateral primary osteoarthritis, right hip: Secondary | ICD-10-CM

## 2021-05-30 DIAGNOSIS — R1084 Generalized abdominal pain: Secondary | ICD-10-CM | POA: Diagnosis not present

## 2021-05-30 DIAGNOSIS — E039 Hypothyroidism, unspecified: Secondary | ICD-10-CM | POA: Diagnosis not present

## 2021-05-30 DIAGNOSIS — M1A09X0 Idiopathic chronic gout, multiple sites, without tophus (tophi): Secondary | ICD-10-CM

## 2021-05-30 DIAGNOSIS — N1831 Chronic kidney disease, stage 3a: Secondary | ICD-10-CM

## 2021-05-30 DIAGNOSIS — E1159 Type 2 diabetes mellitus with other circulatory complications: Secondary | ICD-10-CM

## 2021-05-30 DIAGNOSIS — Z0001 Encounter for general adult medical examination with abnormal findings: Secondary | ICD-10-CM | POA: Diagnosis not present

## 2021-05-30 DIAGNOSIS — I1A0 Resistant hypertension: Secondary | ICD-10-CM

## 2021-05-30 LAB — CBC
HCT: 33.8 % — ABNORMAL LOW (ref 36.0–46.0)
Hemoglobin: 10.7 g/dL — ABNORMAL LOW (ref 12.0–15.0)
MCH: 28 pg (ref 26.0–34.0)
MCHC: 31.7 g/dL (ref 30.0–36.0)
MCV: 88.5 fL (ref 80.0–100.0)
Platelets: 217 10*3/uL (ref 150–400)
RBC: 3.82 MIL/uL — ABNORMAL LOW (ref 3.87–5.11)
RDW: 14.5 % (ref 11.5–15.5)
WBC: 7.5 10*3/uL (ref 4.0–10.5)
nRBC: 0 % (ref 0.0–0.2)

## 2021-05-30 LAB — MICROALBUMIN / CREATININE URINE RATIO
Creatinine,U: 94.9 mg/dL
Microalb Creat Ratio: 12.1 mg/g (ref 0.0–30.0)
Microalb, Ur: 11.5 mg/dL — ABNORMAL HIGH (ref 0.0–1.9)

## 2021-05-30 LAB — COMPREHENSIVE METABOLIC PANEL
ALT: 18 U/L (ref 0–35)
ALT: 22 U/L (ref 0–44)
AST: 17 U/L (ref 0–37)
AST: 22 U/L (ref 15–41)
Albumin: 3.9 g/dL (ref 3.5–5.2)
Albumin: 3.2 g/dL — ABNORMAL LOW (ref 3.5–5.0)
Alkaline Phosphatase: 92 U/L (ref 39–117)
Alkaline Phosphatase: 86 U/L (ref 38–126)
Anion gap: 9 (ref 5–15)
BUN: 22 mg/dL (ref 6–23)
BUN: 22 mg/dL (ref 8–23)
CO2: 33 mEq/L — ABNORMAL HIGH (ref 19–32)
CO2: 27 mmol/L (ref 22–32)
Calcium: 9.7 mg/dL (ref 8.4–10.5)
Calcium: 9.5 mg/dL (ref 8.9–10.3)
Chloride: 100 mEq/L (ref 96–112)
Chloride: 103 mmol/L (ref 98–111)
Creatinine, Ser: 0.91 mg/dL (ref 0.40–1.20)
Creatinine, Ser: 1.06 mg/dL — ABNORMAL HIGH (ref 0.44–1.00)
GFR, Estimated: 53 mL/min — ABNORMAL LOW (ref >60)
GFR: 59.88 mL/min — ABNORMAL LOW (ref >60.00)
Glucose, Bld: 204 mg/dL — ABNORMAL HIGH (ref 70–99)
Glucose, Bld: 246 mg/dL — ABNORMAL HIGH (ref 70–99)
Potassium: 3.8 mEq/L (ref 3.5–5.1)
Potassium: 3.4 mmol/L — ABNORMAL LOW (ref 3.5–5.1)
Sodium: 139 mEq/L (ref 135–145)
Sodium: 139 mmol/L (ref 135–145)
Total Bilirubin: 0.7 mg/dL (ref 0.2–1.2)
Total Bilirubin: 0.4 mg/dL (ref 0.3–1.2)
Total Protein: 6.9 g/dL (ref 6.0–8.3)
Total Protein: 6.4 g/dL — ABNORMAL LOW (ref 6.5–8.1)

## 2021-05-30 LAB — POC URINALSYSI DIPSTICK (AUTOMATED)
Bilirubin, UA: NEGATIVE
Blood, UA: NEGATIVE
Glucose, UA: POSITIVE — AB
Ketones, UA: NEGATIVE
Leukocytes, UA: NEGATIVE
Nitrite, UA: NEGATIVE
Protein, UA: POSITIVE — AB
Spec Grav, UA: 1.025 (ref 1.010–1.025)
Urobilinogen, UA: 0.2 E.U./dL
pH, UA: 6 (ref 5.0–8.0)

## 2021-05-30 LAB — LIPID PANEL
Cholesterol: 134 mg/dL (ref 0–200)
HDL: 41.4 mg/dL (ref >39.00)
LDL Cholesterol: 71 mg/dL (ref 0–99)
NonHDL: 92.7
Total CHOL/HDL Ratio: 3
Triglycerides: 110 mg/dL (ref 0.0–149.0)
VLDL: 22 mg/dL (ref 0.0–40.0)

## 2021-05-30 LAB — CBC WITH DIFFERENTIAL/PLATELET
Basophils Absolute: 0 10*3/uL (ref 0.0–0.1)
Basophils Relative: 0.5 % (ref 0.0–3.0)
Eosinophils Absolute: 0 10*3/uL (ref 0.0–0.7)
Eosinophils Relative: 0.6 % (ref 0.0–5.0)
HCT: 35.8 % — ABNORMAL LOW (ref 36.0–46.0)
Hemoglobin: 11.4 g/dL — ABNORMAL LOW (ref 12.0–15.0)
Lymphocytes Relative: 25.7 % (ref 12.0–46.0)
Lymphs Abs: 1.9 10*3/uL (ref 0.7–4.0)
MCHC: 31.9 g/dL (ref 30.0–36.0)
MCV: 85.4 fl (ref 78.0–100.0)
Monocytes Absolute: 0.4 10*3/uL (ref 0.1–1.0)
Monocytes Relative: 6.2 % (ref 3.0–12.0)
Neutro Abs: 4.9 10*3/uL (ref 1.4–7.7)
Neutrophils Relative %: 67 % (ref 43.0–77.0)
Platelets: 233 10*3/uL (ref 150.0–400.0)
RBC: 4.19 Mil/uL (ref 3.87–5.11)
RDW: 15.4 % (ref 11.5–15.5)
WBC: 7.3 10*3/uL (ref 4.0–10.5)

## 2021-05-30 LAB — LIPASE: Lipase: 39 U/L (ref 11.0–59.0)

## 2021-05-30 LAB — URINALYSIS, ROUTINE W REFLEX MICROSCOPIC
Bilirubin Urine: NEGATIVE
Glucose, UA: NEGATIVE mg/dL
Hgb urine dipstick: NEGATIVE
Ketones, ur: NEGATIVE mg/dL
Leukocytes,Ua: NEGATIVE
Nitrite: NEGATIVE
Protein, ur: NEGATIVE mg/dL
Specific Gravity, Urine: 1.015 (ref 1.005–1.030)
pH: 5 (ref 5.0–8.0)

## 2021-05-30 LAB — URIC ACID: Uric Acid, Serum: 6 mg/dL (ref 2.4–7.0)

## 2021-05-30 LAB — VITAMIN D 25 HYDROXY (VIT D DEFICIENCY, FRACTURES): VITD: 40.11 ng/mL (ref 30.00–100.00)

## 2021-05-30 LAB — TSH: TSH: 4.88 u[IU]/mL (ref 0.35–5.50)

## 2021-05-30 LAB — HEMOGLOBIN A1C: Hgb A1c MFr Bld: 9.2 % — ABNORMAL HIGH (ref 4.6–6.5)

## 2021-05-30 LAB — VITAMIN B12: Vitamin B-12: 420 pg/mL (ref 211–911)

## 2021-05-30 LAB — T4, FREE: Free T4: 0.94 ng/dL (ref 0.60–1.60)

## 2021-05-30 LAB — LIPASE, BLOOD: Lipase: 45 U/L (ref 11–51)

## 2021-05-30 MED ORDER — COLCHICINE 0.6 MG PO CAPS: 1.0000 | ORAL_CAPSULE | Freq: Every day | ORAL | 3 refills | Status: DC | PRN

## 2021-05-30 NOTE — Assessment & Plan Note (Signed)
Preventative protocols reviewed and updated unless pt declined. Discussed healthy diet and lifestyle.  

## 2021-05-30 NOTE — Assessment & Plan Note (Signed)
Continue statin, plavix.  

## 2021-05-30 NOTE — Assessment & Plan Note (Signed)
HOH, doesn't wear hearing aides.

## 2021-05-30 NOTE — Assessment & Plan Note (Signed)
Update levels - should be on 2000 IU replacement.

## 2021-05-30 NOTE — Assessment & Plan Note (Addendum)
BP elevated today - in setting of acute L sided abd pain. Previously well controlled - will reassess once pain resolved.  Last visit we did drop BB dosing due to fatigue.

## 2021-05-30 NOTE — Patient Instructions (Addendum)
If interested, check with pharmacy about new 2 shot shingles series (shingrix).  For left side pain - labs and urinalysis today.  May take tylenol 500mg  2-3 times a day.  If ongoing side pain, let us know to schedule CT of abdomen/pelvis.  Good to see you today.  Return as needed or in 6 months for diabetes follow up visit.   Health Maintenance After Age 80 After age 44, you are at a higher risk for certain long-term diseases and infections as well as injuries from falls. Falls are a major cause of broken bones and head injuries in people who are older than age 46. Getting regular preventive care can help to keep you healthy and well. Preventive care includes getting regular testing and making lifestyle changes as recommended by your health care provider. Talk with your health care provider about: Which screenings and tests you should have. A screening is a test that checks for a disease when you have no symptoms. A diet and exercise plan that is right for you. What should I know about screenings and tests to prevent falls? Screening and testing are the best ways to find a health problem early. Early diagnosis and treatment give you the best chance of managing medical conditions that are common after age 54. Certain conditions and lifestyle choices may make you more likely to have a fall. Your health care provider may recommend: Regular vision checks. Poor vision and conditions such as cataracts can make you more likely to have a fall. If you wear glasses, make sure to get your prescription updated if your vision changes. Medicine review. Work with your health care provider to regularly review all of the medicines you are taking, including over-the-counter medicines. Ask your health care provider about any side effects that may make you more likely to have a fall. Tell your health care provider if any medicines that you take make you feel dizzy or sleepy. Strength and balance checks. Your health care  provider may recommend certain tests to check your strength and balance while standing, walking, or changing positions. Foot health exam. Foot pain and numbness, as well as not wearing proper footwear, can make you more likely to have a fall. Screenings, including: Osteoporosis screening. Osteoporosis is a condition that causes the bones to get weaker and break more easily. Blood pressure screening. Blood pressure changes and medicines to control blood pressure can make you feel dizzy. Depression screening. You may be more likely to have a fall if you have a fear of falling, feel depressed, or feel unable to do activities that you used to do. Alcohol use screening. Using too much alcohol can affect your balance and may make you more likely to have a fall. Follow these instructions at home: Lifestyle Do not drink alcohol if: Your health care provider tells you not to drink. If you drink alcohol: Limit how much you have to: 0-1 drink a day for women. 0-2 drinks a day for men. Know how much alcohol is in your drink. In the [place] one drink equals one 12 oz bottle of beer (355 mL), one 5 oz glass of wine (148 mL), or one 1 oz glass of hard liquor (44 mL). Do not use any products that contain nicotine or tobacco. These products include cigarettes, chewing tobacco, and vaping devices, such as e-cigarettes. If you need help quitting, ask your health care provider. Activity  Follow a regular exercise program to stay fit. This will help you maintain your balance. Ask  your health care provider what types of exercise are appropriate for you. If you need a cane or walker, use it as recommended by your health care provider. Wear supportive shoes that have nonskid soles. Safety  Remove any tripping hazards, such as rugs, cords, and clutter. Install safety equipment such as grab bars in bathrooms and safety rails on stairs. Keep rooms and walkways well-lit. General instructions Talk with your health  care provider about your risks for falling. Tell your health care provider if: You fall. Be sure to tell your health care provider about all falls, even ones that seem minor. You feel dizzy, tiredness (fatigue), or off-balance. Take over-the-counter and prescription medicines only as told by your health care provider. These include supplements. Eat a healthy diet and maintain a healthy weight. A healthy diet includes low-fat dairy products, low-fat (lean) meats, and fiber from whole grains, beans, and lots of fruits and vegetables. Stay current with your vaccines. Schedule regular health, dental, and eye exams. Summary Having a healthy lifestyle and getting preventive care can help to protect your health and wellness after age 58. Screening and testing are the best way to find a health problem early and help you avoid having a fall. Early diagnosis and treatment give you the best chance for managing medical conditions that are more common for people who are older than age 74. Falls are a major cause of broken bones and head injuries in people who are older than age 52. Take precautions to prevent a fall at home. Work with your health care provider to learn what changes you can make to improve your health and wellness and to prevent falls. This information is not intended to replace advice given to you by your health care provider. Make sure you discuss any questions you have with your health care provider. Document Revised: 08/30/2020 Document Reviewed: 08/30/2020 Elsevier Patient Education  [place].

## 2021-05-30 NOTE — Assessment & Plan Note (Signed)
Chronic on atorvastatin 40mg  daily - update levels. The 10-year ASCVD risk score (Arnett DK, et al., 2019) is: 34.2%   Values used to calculate the score:     Age: 81 years     Sex: Female     Is Non-Hispanic African American: Yes     Diabetic: Yes     Tobacco smoker: No     Systolic Blood Pressure: 326 mmHg     Is BP treated: Yes     HDL Cholesterol: 45.7 mg/dL     Total Cholesterol: 137 mg/dL

## 2021-05-30 NOTE — ED Triage Notes (Signed)
Pt c/o L sided flank/abd pain, associated nausea x "a couple weeks," worsening over last couple days. States she feels like she wants to "pass out," unable to sleep. Tylenol helps "a little bit, but not enough." Denies urinary symptoms. Advises she saw PCP & was told she'd be set up w outpt scans, "can't wait."  Also c/o R hip/leg pain "like somebody kicks your knee out," & L neck pain, "like a crick."

## 2021-05-30 NOTE — Progress Notes (Signed)
Patient ID: Kristen [name], female    DOB: 1940/08/03, 81 y.o.   MRN: [id number]  This visit was conducted in person.  BP (!) 166/80 (BP Location: Right Arm, Cuff Size: Large)    Pulse 68    Temp 97.7 F (36.5 C) (Temporal)    Ht 5' 1.75" (1.568 m)    Wt 193 lb 6 oz (87.7 kg)    SpO2 98%    BMI 35.66 kg/m   BP Readings from Last 3 Encounters:  05/31/21 (!) 187/70  05/30/21 (!) 166/80  01/05/21 136/68  BP remains elevated on repeat testing.   CC: CPE, hip pain Subjective:   HPI: Kristen [name] is a 81 y.o. female presenting on 05/30/2021 for Annual Exam [facility] prt 2. ) and Hip Pain (C/o L hip pain, started in R hip.  Off and on about 2 wks, worsened in last 2 days.  Thinks it may be gout. )   Saw health advisor 03/2021 for medicare wellness visit. Note reviewed.    No results found.  Flowsheet Row Clinical Support from 04/05/2021 in [place] [facility]  PHQ-2 Total Score 0       Fall Risk  04/05/2021 04/08/2020 03/31/2020 02/02/2020 12/19/2019  Falls in the past year? 0 0 0 1 1  Comment - - - - -  Number falls in past yr: 0 0 0 1 1  Comment - Denies recent falls - - -  Injury with Fall? 0 0 0 0 0  Comment - N/A- no falls reported - - -  Risk Factor Category  - - - - -  Risk for fall due to : No Fall Risks Medication side effect Medication side effect History of fall(s);Impaired balance/gait;Impaired mobility History of fall(s);Impaired balance/gait;Impaired mobility  Risk for fall due to: Comment - - - - -  Follow up Falls prevention discussed Falls prevention discussed Falls evaluation completed;Falls prevention discussed Falls evaluation completed;Falls prevention discussed Falls evaluation completed;Falls prevention discussed   2 wk h/o bilateral hip pain. Started on right with radiation down to ankle. This resolved. Now notes left hip pain. Points to lateral hip. Constant pain, affective ambulation. Denies inciting trauma/injury or falls. No lower back pain.  Pain is getting worse. No bowel changes or fevers or blood in stool.   HTN - bp elevated despite regular regimen of amlodipine 34m daily, hydralazine 529mbid, hctz 12.52m87maily, losartan 100m13mily, and toprol XL 12.52mg 36mly.   Preventative: Colonoscopy 07/2020 done for positive iFOB - TA and TVA (large) consider rpt 3 yrs (Armbruster)  EGD 07/2020 - H pylori positive (treated), dilation of upper esoph stricture Well woman - s/p hysterectomy for fibroids, ovaries remain. Discussed GYN cancers. Denies symptoms.  [facility] - [facility] Q2yrs.6yrXA WNL 03/2015  Lung cancer screening - not eligible  Flu shot - declines COVID vaccine - discussed, declines  Prevnar-13 08/2013, pneumovax 2016  Zostavax - 03/2014  Shingrix - discussed, to check at pharmacy.  Advanced directive: scanned 11/20219 - sons ErnestJaquelyn Bitterric aRandall HissCPOA.[place] not want prolonged life support if terminal condition. MOST form reviewed and filled out 10/2017 as well.  Seat belt use discussed  Sunscreen use discussed. No changing moles on skin.  Non smoker  Alcohol - rare  Dentist - has dentures Eye exam - yearly  Bowel - no constipation  Bladder - no urinary incontinence, no stress incontinence symptoms    Lives alone  4 grown children  Education: 11th grade Occupation: retired, was Scientist, water quality   Activity: no regular walking.  Diet: good water, fruits/vegetables seldom      Relevant past medical, surgical, family and social history reviewed and updated as indicated. Interim medical history since our last visit reviewed. Allergies and medications reviewed and updated. Outpatient Medications Prior to Visit  Medication Sig Dispense Refill   amLODipine (NORVASC) 5 MG tablet TAKE ONE TABLET BY MOUTH DAILY (Patient taking differently: Take 5 mg by mouth daily.) 90 tablet 0   atorvastatin (LIPITOR) 40 MG tablet TAKE ONE TABLET BY MOUTH ONCE DAILY (Patient taking differently: Take 40  mg by mouth daily.) 90 tablet 0   bismuth-metronidazole-tetracycline (PYLERA) 140-125-125 MG capsule Take 3 capsules by mouth 4 (four) times daily -  before meals and at bedtime. (Patient not taking: Reported on 05/31/2021) 120 capsule 0   Blood Glucose Monitoring Suppl (ONETOUCH VERIO) w/Device KIT Use to check blood sugar up to 4 times a day as directed 1 kit 0   Cholecalciferol (VITAMIN D) 2000 units CAPS Take 2,000 Units by mouth daily.     clopidogrel (PLAVIX) 75 MG tablet Take 1 tablet (75 mg total) by mouth daily. 90 tablet 3   glucose blood (ONETOUCH VERIO) test strip Check blood sugar once daily 100 strip 3   hydrALAZINE (APRESOLINE) 50 MG tablet Take 1 tablet (50 mg total) by mouth 2 (two) times daily. 180 tablet 3   hydrochlorothiazide (HYDRODIURIL) 12.5 MG tablet Take 12.5 mg by mouth every morning.     Insulin Glargine (BASAGLAR KWIKPEN) 100 UNIT/ML Inject 18 Units into the skin at bedtime.     Insulin Pen Needle (B-D UF III MINI PEN NEEDLES) 31G X 5 MM MISC Use once daily 100 each 3   Lancets (ONETOUCH ULTRASOFT) lancets Use to check blood sugar up to 4 times a day as directed 100 each 11   losartan (COZAAR) 100 MG tablet Take 100 mg by mouth every morning.     metoprolol succinate (TOPROL-XL) 25 MG 24 hr tablet Take 0.5 tablets (12.5 mg total) by mouth daily. 45 tablet 3   nitroGLYCERIN (NITROSTAT) 0.4 MG SL tablet Place 0.4 mg under the tongue every 5 (five) minutes as needed for chest pain.     nystatin cream (MYCOSTATIN) Apply 1 application topically 2 (two) times daily. (Patient taking differently: Apply 1 application topically daily as needed for dry skin (rash).) 30 g 0   potassium chloride (KLOR-CON) 10 MEQ tablet Take 1 tablet (10 mEq total) by mouth every Monday, Wednesday, and Friday. 40 tablet 3   vitamin B-12 (CYANOCOBALAMIN) 1000 MCG tablet Take 1 tablet (1,000 mcg total) by mouth daily.     Facility-Administered Medications Prior to Visit  Medication Dose Route Frequency  Provider Last Rate Last Admin   0.9 %  sodium chloride infusion  500 mL Intravenous Once Armbruster, Carlota Raspberry, MD         Per HPI unless specifically indicated in ROS section below Review of Systems  Constitutional:  Negative for activity change, appetite change, chills, fatigue, fever and unexpected weight change.  HENT:  Negative for hearing loss.   Eyes:  Negative for visual disturbance.  Respiratory:  Negative for cough, chest tightness, shortness of breath and wheezing.   Cardiovascular:  Negative for chest pain, palpitations and leg swelling.  Gastrointestinal:  Negative for abdominal distention, abdominal pain, blood in stool, constipation, diarrhea, nausea and vomiting.  Genitourinary:  Negative for difficulty urinating and  hematuria.  Musculoskeletal:  Negative for arthralgias, myalgias and neck pain.       Hip pain  Skin:  Negative for rash.  Neurological:  Negative for dizziness, seizures, syncope and headaches.  Hematological:  Negative for adenopathy. Does not bruise/bleed easily.  Psychiatric/Behavioral:  Negative for dysphoric mood. The patient is not nervous/anxious.    Objective:  BP (!) 166/80 (BP Location: Right Arm, Cuff Size: Large)    Pulse 68    Temp 97.7 F (36.5 C) (Temporal)    Ht 5' 1.75" (1.568 m)    Wt 193 lb 6 oz (87.7 kg)    SpO2 98%    BMI 35.66 kg/m   Wt Readings from Last 3 Encounters:  05/30/21 193 lb 6 oz (87.7 kg)  04/05/21 192 lb (87.1 kg)  01/05/21 192 lb 1 oz (87.1 kg)      Physical Exam Vitals and nursing note reviewed.  Constitutional:      Appearance: Normal appearance. She is not ill-appearing.  HENT:     Head: Normocephalic and atraumatic.     Right Ear: Tympanic membrane, ear canal and external ear normal. There is no impacted cerumen.     Left Ear: Tympanic membrane, ear canal and external ear normal. There is no impacted cerumen.  Eyes:     General:        Right eye: No discharge.        Left eye: No discharge.     Extraocular  Movements: Extraocular movements intact.     Conjunctiva/sclera: Conjunctivae normal.     Pupils: Pupils are equal, round, and reactive to light.  Neck:     Thyroid: No thyroid mass or thyromegaly.  Cardiovascular:     Rate and Rhythm: Normal rate and regular rhythm.     Pulses: Normal pulses.     Heart sounds: Normal heart sounds. No murmur heard. Pulmonary:     Effort: Pulmonary effort is normal. No respiratory distress.     Breath sounds: Normal breath sounds. No wheezing, rhonchi or rales.  Abdominal:     General: Bowel sounds are normal. There is no distension.     Palpations: Abdomen is soft. There is no mass.     Tenderness: There is abdominal tenderness (left side abdominal wall pain). There is no right CVA tenderness, left CVA tenderness, guarding or rebound. Negative signs include Murphy's sign.     Hernia: No hernia is present.    Musculoskeletal:     Cervical back: Normal range of motion and neck supple. No rigidity.     Right lower leg: No edema.     Left lower leg: No edema.     Comments:  No pain with int/ext rotation of femur in hips bilaterally Neg seated SLR test  No pain with palpation of bilateral trochanteric bursas  Lymphadenopathy:     Cervical: No cervical adenopathy.  Skin:    General: Skin is warm and dry.     Findings: No rash.  Neurological:     General: No focal deficit present.     Mental Status: She is alert. Mental status is at baseline.  Psychiatric:        Mood and Affect: Mood normal.        Behavior: Behavior normal.      Results for orders placed or performed in visit on 05/30/21  Lipid panel  Result Value Ref Range   Cholesterol 134 0 - 200 mg/dL   Triglycerides 110.0 0.0 - 149.0 mg/dL  HDL 41.40 >39.00 mg/dL   VLDL 22.0 0.0 - 40.0 mg/dL   LDL Cholesterol 71 0 - 99 mg/dL   Total CHOL/HDL Ratio 3    NonHDL 92.70   Comprehensive metabolic panel  Result Value Ref Range   Sodium 139 135 - 145 mEq/L   Potassium 3.8 3.5 - 5.1 mEq/L    Chloride 100 96 - 112 mEq/L   CO2 33 (H) 19 - 32 mEq/L   Glucose, Bld 204 (H) 70 - 99 mg/dL   BUN 22 6 - 23 mg/dL   Creatinine, Ser 0.91 0.40 - 1.20 mg/dL   Total Bilirubin 0.7 0.2 - 1.2 mg/dL   Alkaline Phosphatase 92 39 - 117 U/L   AST 17 0 - 37 U/L   ALT 18 0 - 35 U/L   Total Protein 6.9 6.0 - 8.3 g/dL   Albumin 3.9 3.5 - 5.2 g/dL   GFR 59.88 (L) >60.00 mL/min   Calcium 9.7 8.4 - 10.5 mg/dL  Vitamin B12  Result Value Ref Range   Vitamin B-12 420 211 - 911 pg/mL  VITAMIN D 25 Hydroxy (Vit-D Deficiency, Fractures)  Result Value Ref Range   VITD 40.11 30.00 - 100.00 ng/mL  TSH  Result Value Ref Range   TSH 4.88 0.35 - 5.50 uIU/mL  T4, free  Result Value Ref Range   Free T4 0.94 0.60 - 1.60 ng/dL  Hemoglobin A1c  Result Value Ref Range   Hgb A1c MFr Bld 9.2 (H) 4.6 - 6.5 %  CBC with Differential/Platelet  Result Value Ref Range   WBC 7.3 4.0 - 10.5 K/uL   RBC 4.19 3.87 - 5.11 Mil/uL   Hemoglobin 11.4 (L) 12.0 - 15.0 g/dL   HCT 35.8 (L) 36.0 - 46.0 %   MCV 85.4 78.0 - 100.0 fl   MCHC 31.9 30.0 - 36.0 g/dL   RDW 15.4 11.5 - 15.5 %   Platelets 233.0 150.0 - 400.0 K/uL   Neutrophils Relative % 67.0 43.0 - 77.0 %   Lymphocytes Relative 25.7 12.0 - 46.0 %   Monocytes Relative 6.2 3.0 - 12.0 %   Eosinophils Relative 0.6 0.0 - 5.0 %   Basophils Relative 0.5 0.0 - 3.0 %   Neutro Abs 4.9 1.4 - 7.7 K/uL   Lymphs Abs 1.9 0.7 - 4.0 K/uL   Monocytes Absolute 0.4 0.1 - 1.0 K/uL   Eosinophils Absolute 0.0 0.0 - 0.7 K/uL   Basophils Absolute 0.0 0.0 - 0.1 K/uL  Microalbumin / creatinine urine ratio  Result Value Ref Range   Microalb, Ur 11.5 (H) 0.0 - 1.9 mg/dL   Creatinine,U 94.9 mg/dL   Microalb Creat Ratio 12.1 0.0 - 30.0 mg/g  Uric acid  Result Value Ref Range   Uric Acid, Serum 6.0 2.4 - 7.0 mg/dL  Lipase  Result Value Ref Range   Lipase 39.0 11.0 - 59.0 U/L  POCT Urinalysis Dipstick (Automated)  Result Value Ref Range   Color, UA yellow    Clarity, UA clear     Glucose, UA Positive (A) Negative   Bilirubin, UA negative    Ketones, UA negative    Spec Grav, UA 1.025 1.010 - 1.025   Blood, UA negative    pH, UA 6.0 5.0 - 8.0   Protein, UA Positive (A) Negative   Urobilinogen, UA 0.2 0.2 or 1.0 E.U./dL   Nitrite, UA negative    Leukocytes, UA Negative Negative    Assessment & Plan:  This visit occurred during the SARS-CoV-2  public health emergency.  Safety protocols were in place, including screening questions prior to the visit, additional usage of staff PPE, and extensive cleaning of exam room while observing appropriate contact time as indicated for disinfecting solutions.   Problem List Items Addressed This Visit     Encounter for general adult medical examination with abnormal findings - Primary (Chronic)    Preventative protocols reviewed and updated unless pt declined. Discussed healthy diet and lifestyle.       Resistant hypertension    BP elevated today - in setting of acute L sided abd pain. Previously well controlled - will reassess once pain resolved.  Last visit we did drop BB dosing due to fatigue.       Hyperlipidemia associated with type 2 diabetes mellitus (HCC)    Chronic on atorvastatin 23m daily - update levels. The 10-year ASCVD risk score (Arnett DK, et al., 2019) is: 34.2%   Values used to calculate the score:     Age: [age]years     Sex: Female     Is Non-Hispanic African American: Yes     Diabetic: Yes     Tobacco smoker: No     Systolic Blood Pressure: 1326mmHg     Is BP treated: Yes     HDL Cholesterol: 45.7 mg/dL     Total Cholesterol: 137 mg/dL       Relevant Orders   Lipid panel (Completed)   Comprehensive metabolic panel (Completed)   Severe obesity (BMI 35.0-39.9) with comorbidity ([place]    Encouraged ongoing efforts towards healthy diet and lifestyle to affect sustainable weight loss.       Type 2 diabetes mellitus with hyperglycemia ([place]    Update labs today including A1c. Previously well  controlled only on basaglar. Consider SGLT2 vs GLP1RA if additional treatment needed. Metformin allergy - chills, dizziness)      Relevant Orders   Hemoglobin A1c (Completed)   Microalbumin / creatinine urine ratio (Completed)   CAD (coronary artery disease)/coronary calcifications    Continue plavix, statin.       Sensorineural hearing loss (SNHL) of both ears    HOH, doesn't wear hearing aides.       Aortic atherosclerosis (HCC)    Continue statin, plavix.       History of CVA (cerebrovascular accident)    Continue statin, plavix.       Gout    Refill colchicine PRN.  Update urate levels.       Relevant Orders   Uric acid (Completed)   Vitamin D deficiency    Update levels - should be on 2000 IU replacement.       Relevant Orders   VITAMIN D 25 Hydroxy (Vit-D Deficiency, Fractures) (Completed)   Hemiparesis of right dominant side (HCC)   Borderline hypothyroidism    Update thyroid function       Relevant Orders   TSH (Completed)   T4, free (Completed)   Polypharmacy   Type 2 diabetes mellitus with vascular disease ([place]    H/o CVA       Hip osteoarthritis    H/o this, today's pain seems different - see above.       Relevant Medications   Colchicine (MITIGARE) 0.6 MG CAPS   Chronic kidney disease, stage 3a (HCC)    Update UA, Umicro, and kidney function.       Low serum vitamin B12    Update levels on b12 10010m daily replacement.  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       Left sided abdominal pain    Exam today more consistent with L sided pain, not hip pain. ?MSK abdominal wall pain/strain. Check labwork and urinalysis today eval for causes such as kidney stones, pyelo, colitis. rec start with  tylenol 568m TID for pain. Update if ongoing for CT scan. Pt agrees with plan.       Relevant Orders   Lipase (Completed)   POCT Urinalysis Dipstick (Automated) (Completed)     Meds ordered this encounter  Medications   Colchicine (MITIGARE) 0.6 MG CAPS    Sig: Take 1 tablet by mouth daily as needed (for gout).    Dispense:  30 capsule    Refill:  3    Or equivalent preferred insurance formulary   Orders Placed This Encounter  Procedures   Lipid panel   Comprehensive metabolic panel   Vitamin BF12  VITAMIN D 25 Hydroxy (Vit-D Deficiency, Fractures)   TSH   T4, free   Hemoglobin A1c   CBC with Differential/Platelet   Microalbumin / creatinine urine ratio   Uric acid   Lipase   POCT Urinalysis Dipstick (Automated)    Patient instructions: If interested, check with pharmacy about new 2 shot shingles series (shingrix).  For left side pain - labs and urinalysis today.  May take tylenol 5016m2-3 times a day.  If ongoing side pain, let usKoreanow to schedule CT of abdomen/pelvis.  Good to see you today.  Return as needed or in 6 months for diabetes follow up visit.   Follow up plan: Return in about 6 months (around 11/27/2021) for follow up visit.  [name]

## 2021-05-30 NOTE — Chronic Care Management (AMB) (Addendum)
Chronic Care Management Pharmacy Assistant   Name: Kristen [name]  MRN: [id number] DOB: 10-27-1942t.  Reason for Encounter: Medication Adherence and Delivery Coordination    Recent office visits:  05/30/21 - Kristen Bush, MD PCP - Patient presented for AWV. A1c increased to 9.2% on Basaglar 18 units daily.  Start Jardiance 10 mg daily, will price out and proceed with patient assistance if unaffordable.  Recent consult visits:  None since last CCM contact  Hospital visits:  05/30/21 - [facility] ED - Patient presented for flank pain. CT scan. No UTI or kidney stone found. Start short course of oxycodone 5/359m take 1 tablet every 6 hours as needed for pain.  Medications: Outpatient Encounter Medications as of 05/30/2021  Medication Sig   amLODipine (NORVASC) 5 MG tablet TAKE ONE TABLET BY MOUTH DAILY (Patient taking differently: Take 5 mg by mouth daily.)   atorvastatin (LIPITOR) 40 MG tablet TAKE ONE TABLET BY MOUTH ONCE DAILY (Patient taking differently: Take 40 mg by mouth daily.)   bismuth-metronidazole-tetracycline (PYLERA) 140-125-125 MG capsule Take 3 capsules by mouth 4 (four) times daily -  before meals and at bedtime. (Patient not taking: Reported on 05/31/2021)   Blood Glucose Monitoring Suppl (ONETOUCH VERIO) w/Device KIT Use to check blood sugar up to 4 times a day as directed   Cholecalciferol (VITAMIN D) 2000 units CAPS Take 2,000 Units by mouth daily.   clopidogrel (PLAVIX) 75 MG tablet Take 1 tablet (75 mg total) by mouth daily.   Colchicine (MITIGARE) 0.6 MG CAPS Take 1 tablet by mouth daily as needed (for gout).   glucose blood (ONETOUCH VERIO) test strip Check blood sugar once daily   hydrALAZINE (APRESOLINE) 50 MG tablet Take 1 tablet (50 mg total) by mouth 2 (two) times daily.   hydrochlorothiazide (HYDRODIURIL) 12.5 MG tablet Take 12.5 mg by mouth every morning.   Insulin Glargine (BASAGLAR KWIKPEN) 100 UNIT/ML Inject 18 Units into the skin at bedtime.    Insulin Pen Needle (B-D UF III MINI PEN NEEDLES) 31G X 5 MM MISC Use once daily   Lancets (ONETOUCH ULTRASOFT) lancets Use to check blood sugar up to 4 times a day as directed   losartan (COZAAR) 100 MG tablet Take 100 mg by mouth every morning.   metoprolol succinate (TOPROL-XL) 25 MG 24 hr tablet Take 0.5 tablets (12.5 mg total) by mouth daily.   nitroGLYCERIN (NITROSTAT) 0.4 MG SL tablet Place 0.4 mg under the tongue every 5 (five) minutes as needed for chest pain.   nystatin cream (MYCOSTATIN) Apply 1 application topically 2 (two) times daily. (Patient taking differently: Apply 1 application topically daily as needed for dry skin (rash).)   potassium chloride (KLOR-CON) 10 MEQ tablet Take 1 tablet (10 mEq total) by mouth every Monday, Wednesday, and Friday.   vitamin B-12 (CYANOCOBALAMIN) 1000 MCG tablet Take 1 tablet (1,000 mcg total) by mouth daily.   Facility-Administered Encounter Medications as of 05/30/2021  Medication   0.9 %  sodium chloride infusion   BP Readings from Last 3 Encounters:  05/31/21 (!) 187/70  05/30/21 (!) 166/80  01/05/21 136/68    Lab Results  Component Value Date   HGBA1C 9.2 (H) 05/30/2021     Last adherence delivery date:  03/11/21      Patient is due for next adherence delivery on: 06/09/21  Spoke with patient on 05/31/21 reviewed medications and coordinated delivery.  This delivery to include: Adherence Packaging  90 Days  Packs: Atorvastatin 40 mg 1  tablet daily (breakfast) Metoprolol Succinate 25 mg take 1/2  tablet daily (breakfast)  Hydralazine 50 mg 1 tablet twice daily (1-breakfast, 1-bedtime) Clopidogrel 75 mg 1 tablet daily (breakfast) Potassium chloride ER 10 Meq 1 tablet daily on Mon, Wed, Friday (breakfast) Amlodipine 5 mg 1 tablet daily (breakfast) Losartan 100 mg 1 tablet daily (breakfast Hydrochlorothiazide 12.5 mg 1 tablet daily (breakfast)  VIAL medications: One touch delica plus lancets 54Y One touch verio test strips Pen  needles use as directed once daily at bedtime  Patient declined the following medications this month: Basaglar 100 unit/ml-inject 18 units at night- gets from manufacturer Januvia 11m  take 1 tablet at breakfast-gets from manufacturer  (unable to confirm)  Any concerns about your medications? Yes  - unable to afford Jardiance 10 mg copay   How often do you forget or accidentally miss a dose? Rarely  Patient has missed metoprolol a few days due to fatigue.  Is patient in packaging Yes  What is the date on your next pill pack? Unable to reach the box for date due to pain   Any concerns or issues with your packaging? Happy with the packaging process  Refills requested from providers include: onetouch lancets, onetouch test strips, potassium chloride ER, metoprolol succ ER, amlodipine, atorvastatin   Confirmed delivery date of 06/09/21, advised patient that pharmacy will contact them the morning of delivery.  Recent blood pressure readings are as follows: No readings available and the patient reports she was in so much pain, she did not take BP monitor with her to PCP   Recent blood glucose readings are as follows: No readings available due to the patient has been in so much pain lately. Today her pain has improved and the patient states she will get back on track with documenting readings  Annual wellness visit in last year? Yes Most Recent BP reading: 168/78  68-P  (office 05/30/21) patient reports she was in lots of pain   If Diabetic: Most recent A1C reading: 9.2% 05/30/21 Last eye exam / retinopathy screening: 01/12/21 Last diabetic foot exam: 01/05/21  MDebbora Dus CPP notified  VAvel Sensor [place]istant [phone] I have reviewed the care management and care coordination activities outlined in this encounter and I am certifying that I agree with the content of this note. Discontinued metoprolol 25 mg from pharmacy records and ensured the 25 mg (1/2 tablet)  will be filled in this delivery. Will start Jardiance PAP.   MDebbora Dus PharmD Clinical Pharmacist [phone]

## 2021-05-30 NOTE — Assessment & Plan Note (Addendum)
Update labs today including A1c. Previously well controlled only on basaglar. Consider SGLT2 vs GLP1RA if additional treatment needed. Metformin allergy - chills, dizziness)

## 2021-05-30 NOTE — Assessment & Plan Note (Addendum)
Continue plavix, statin.  

## 2021-05-30 NOTE — Assessment & Plan Note (Signed)
Encouraged ongoing efforts towards healthy diet and lifestyle to affect sustainable weight loss.

## 2021-05-30 NOTE — Assessment & Plan Note (Signed)
Refill colchicine PRN.  Update urate levels.

## 2021-05-30 NOTE — Assessment & Plan Note (Signed)
Update thyroid function.  

## 2021-05-31 ENCOUNTER — Emergency Department (HOSPITAL_COMMUNITY): Payer: Medicare Other

## 2021-05-31 ENCOUNTER — Encounter (HOSPITAL_COMMUNITY): Payer: Self-pay

## 2021-05-31 ENCOUNTER — Other Ambulatory Visit: Payer: Self-pay | Admitting: Family Medicine

## 2021-05-31 DIAGNOSIS — R109 Unspecified abdominal pain: Secondary | ICD-10-CM | POA: Diagnosis not present

## 2021-05-31 DIAGNOSIS — K760 Fatty (change of) liver, not elsewhere classified: Secondary | ICD-10-CM | POA: Diagnosis not present

## 2021-05-31 DIAGNOSIS — I700 Atherosclerosis of aorta: Secondary | ICD-10-CM | POA: Diagnosis not present

## 2021-05-31 MED ORDER — SODIUM CHLORIDE 0.9 % IV BOLUS
500.0000 mL | Freq: Once | INTRAVENOUS | Status: AC
  Administered 2021-05-31: 500 mL via INTRAVENOUS

## 2021-05-31 MED ORDER — EMPAGLIFLOZIN 10 MG PO TABS: 10.0000 mg | ORAL_TABLET | Freq: Every day | ORAL | 11 refills | Status: DC

## 2021-05-31 MED ORDER — IOHEXOL 300 MG/ML  SOLN
80.0000 mL | Freq: Once | INTRAMUSCULAR | Status: AC | PRN
  Administered 2021-05-31: 80 mL via INTRAVENOUS

## 2021-05-31 MED ORDER — IOHEXOL 300 MG/ML  SOLN: 80.0000 mL | Freq: Once | INTRAMUSCULAR | Status: DC | PRN

## 2021-05-31 MED ORDER — OXYCODONE-ACETAMINOPHEN 5-325 MG PO TABS
1.0000 | ORAL_TABLET | Freq: Once | ORAL | Status: AC
  Administered 2021-05-31: 1 via ORAL
  Filled 2021-05-31: qty 1

## 2021-05-31 MED ORDER — OXYCODONE-ACETAMINOPHEN 5-325 MG PO TABS
1.0000 | ORAL_TABLET | Freq: Four times a day (QID) | ORAL | 0 refills | Status: DC | PRN
Start: 2021-05-31 — End: 2021-09-14

## 2021-05-31 MED ORDER — ONDANSETRON HCL 4 MG/2ML IJ SOLN
4.0000 mg | Freq: Once | INTRAMUSCULAR | Status: AC
  Administered 2021-05-31: 4 mg via INTRAVENOUS
  Filled 2021-05-31: qty 2

## 2021-05-31 MED ORDER — MORPHINE SULFATE (PF) 4 MG/ML IV SOLN
4.0000 mg | Freq: Once | INTRAVENOUS | Status: AC
  Administered 2021-05-31: 4 mg via INTRAVENOUS
  Filled 2021-05-31: qty 1

## 2021-05-31 NOTE — Assessment & Plan Note (Addendum)
Update UA, Umicro, and kidney function.

## 2021-05-31 NOTE — Assessment & Plan Note (Addendum)
Exam today more consistent with L sided pain, not hip pain. ?MSK abdominal wall pain/strain. Check labwork and urinalysis today eval for causes such as kidney stones, pyelo, colitis. rec start with tylenol 500mg  TID for pain. Update if ongoing for CT scan. Pt agrees with plan.

## 2021-05-31 NOTE — Assessment & Plan Note (Addendum)
H/o this, today's pain seems different - see above.

## 2021-05-31 NOTE — Assessment & Plan Note (Addendum)
Referred to retinologist 12/2020, I don't see where she's been seen yet.

## 2021-05-31 NOTE — Discharge Instructions (Addendum)
You were seen today for flank pain.  The cause of your pain is unknown.  It may be musculoskeletal.  You did have several incidental findings on your CT scan.  You need to follow-up with your primary physician to have a dedicated MRI of your pancreas.  You have a small lesion on your pancreas that may be a pseudocyst but needs to be further characterized.  Take medication for your pain.  If not improving in the next 2 to 3 days, you should be reevaluated.

## 2021-05-31 NOTE — ED Notes (Signed)
Patient verbalizes understanding of d/c instructions. Opportunities for questions and answers were provided. Pt d/c from ED and wheeled to lobby with family.  

## 2021-05-31 NOTE — Assessment & Plan Note (Signed)
H/o CVA

## 2021-05-31 NOTE — ED Provider Notes (Signed)
[name] Provider Note   CSN: [id number] Arrival date & time: 05/30/21  1916     History  Chief Complaint  Patient presents with   Abdominal Pain   Flank Pain         Kristen [name] is a 81 y.o. female.  HPI     This is a 81 year old female who presents with left flank pain.  Patient reports several week history of left flank pain that is worsened over the last 2 or 3 days.  She reports that it is in her left flank.  Nothing seems to make it better or worse.  She has taken Tylenol with minimal relief.  She has not noted any bowel or bladder difficulties.  She states she has had increasing difficulty sleeping secondary to pain.  Denies urinary symptoms or hematuria.  No fevers.  Denies changes in bowels.  Does report nausea without vomiting.  Has never had pain like this before.  Home Medications Prior to Admission medications   Medication Sig Start Date End Date Taking? Authorizing Provider  amLODipine (NORVASC) 5 MG tablet TAKE ONE TABLET BY MOUTH DAILY Patient taking differently: Take 5 mg by mouth daily. 03/01/21  Yes [name], MD  atorvastatin (LIPITOR) 40 MG tablet TAKE ONE TABLET BY MOUTH ONCE DAILY Patient taking differently: Take 40 mg by mouth daily. 03/01/21  Yes [name], MD  Blood Glucose Monitoring Suppl [facility] VERIO) w/Device KIT Use to check blood sugar up to 4 times a day as directed 05/14/20  Yes [name], MD  Cholecalciferol (VITAMIN D) 2000 units CAPS Take 2,000 Units by mouth daily.   Yes [provider]  clopidogrel (PLAVIX) 75 MG tablet Take 1 tablet (75 mg total) by mouth daily. 06/28/20  Yes [name], MD  Colchicine (MITIGARE) 0.6 MG CAPS Take 1 tablet by mouth daily as needed (for gout). 05/30/21  Yes [name], MD  hydrALAZINE (APRESOLINE) 50 MG tablet Take 1 tablet (50 mg total) by mouth 2 (two) times daily. 06/25/20 06/25/21 Yes [name], MD   hydrochlorothiazide (HYDRODIURIL) 12.5 MG tablet Take 12.5 mg by mouth every morning. 12/07/20  Yes [provider]  Insulin Glargine (BASAGLAR KWIKPEN) 100 UNIT/ML Inject 18 Units into the skin at bedtime.   Yes [provider]  Insulin Pen Needle (B-D UF III MINI PEN NEEDLES) 31G X 5 MM MISC Use once daily 12/08/20  Yes [name], MD  Lancets [facility] ULTRASOFT) lancets Use to check blood sugar up to 4 times a day as directed 05/14/20  Yes [name], MD  losartan (COZAAR) 100 MG tablet Take 100 mg by mouth every morning. 12/07/20  Yes [provider]  metoprolol succinate (TOPROL-XL) 25 MG 24 hr tablet Take 0.5 tablets (12.5 mg total) by mouth daily. 01/05/21  Yes [name], MD  nitroGLYCERIN (NITROSTAT) 0.4 MG SL tablet Place 0.4 mg under the tongue every 5 (five) minutes as needed for chest pain.   Yes [provider]  nystatin cream (MYCOSTATIN) Apply 1 application topically 2 (two) times daily. Patient taking differently: Apply 1 application topically daily as needed for dry skin (rash). 07/07/20  Yes [name], MD  oxyCODONE-acetaminophen (PERCOCET/ROXICET) 5-325 MG tablet Take 1 tablet by mouth every 6 (six) hours as needed for severe pain. 05/31/21  Yes Landrie Beale, Barbette Hair, MD  potassium chloride (KLOR-CON) 10 MEQ tablet Take 1 tablet (10 mEq total) by mouth every Monday, Wednesday, and Friday. 06/28/20  Yes [name],  MD  vitamin B-12 (CYANOCOBALAMIN) 1000 MCG tablet Take 1 tablet (1,000 mcg total) by mouth daily. 01/10/21  Yes [name], MD  bismuth-metronidazole-tetracycline [facility]) [phone] MG capsule Take 3 capsules by mouth 4 (four) times daily -  before meals and at bedtime. Patient not taking: Reported on 05/31/2021 09/14/20   [name], MD  empagliflozin (JARDIANCE) 10 MG TABS tablet Take 1 tablet (10 mg total) by mouth daily before breakfast. 05/31/21   [name], MD  glucose blood [facility]  VERIO) test strip Check blood sugar once daily 02/25/21   [name], MD      Allergies    Bee venom, Peanut-containing drug products, Aleve [naproxen sodium], Doxycycline, Metformin and related, and Penicillins    Review of Systems   Review of Systems  Constitutional:  Negative for fever.  Genitourinary:  Positive for flank pain.  All other systems reviewed and are negative.  Physical Exam Updated Vital Signs BP (!) 187/70 (BP Location: Left Arm)    Pulse 60    Temp 98 F (36.7 C) (Oral)    Resp 18    SpO2 100%  Physical Exam Vitals and nursing note reviewed.  Constitutional:      Appearance: She is well-developed. She is obese. She is not ill-appearing.  HENT:     Head: Normocephalic and atraumatic.  Eyes:     Pupils: Pupils are equal, round, and reactive to light.  Cardiovascular:     Rate and Rhythm: Normal rate and regular rhythm.     Heart sounds: Normal heart sounds.  Pulmonary:     Effort: Pulmonary effort is normal. No respiratory distress.     Breath sounds: No wheezing.  Abdominal:     General: Bowel sounds are normal.     Palpations: Abdomen is soft.     Tenderness: There is generalized abdominal tenderness and tenderness in the epigastric area and left lower quadrant. There is no guarding or rebound.  Musculoskeletal:     Cervical back: Neck supple.  Skin:    General: Skin is warm and dry.  Neurological:     Mental Status: She is alert and oriented to person, place, and time.  Psychiatric:        Mood and Affect: Mood normal.    ED Results / Procedures / Treatments   Labs (all labs ordered are listed, but only abnormal results are displayed) Labs Reviewed  COMPREHENSIVE METABOLIC PANEL - Abnormal; Notable for the following components:      Result Value   Potassium 3.4 (*)    Glucose, Bld 246 (*)    Creatinine, Ser 1.06 (*)    Total Protein 6.4 (*)    Albumin 3.2 (*)    GFR, Estimated 53 (*)    All other components within normal limits  CBC -  Abnormal; Notable for the following components:   RBC 3.82 (*)    Hemoglobin 10.7 (*)    HCT 33.8 (*)    All other components within normal limits  LIPASE, BLOOD  URINALYSIS, ROUTINE W REFLEX MICROSCOPIC    EKG None  Radiology CT ABDOMEN PELVIS W CONTRAST  Result Date: 05/31/2021 CLINICAL DATA:  Acute abdominal pain. EXAM: CT ABDOMEN AND PELVIS WITH CONTRAST TECHNIQUE: Multidetector CT imaging of the abdomen and pelvis was performed using the standard protocol following bolus administration of intravenous contrast. RADIATION DOSE REDUCTION: This exam was performed according to the departmental dose-optimization program which includes automated exposure control, adjustment of the mA and/or kV according to patient  size and/or use of iterative reconstruction technique. CONTRAST:  73mL OMNIPAQUE IOHEXOL 300 MG/ML  SOLN COMPARISON:  CT with IV and oral contrast 03/10/2014 FINDINGS: Lower chest: Lung bases are clear of infiltrates. The heart is slightly enlarged. There is heavy three-vessel calcific CAD. There is no pericardial effusion. Hepatobiliary: 15 cm length mildly steatotic liver. No mass enhancement. Some images suggest early nodular change to the anterior liver contour which could be due to early cirrhosis. The portal vein however is not dilated. The gallbladder and bile ducts are unremarkable. Pancreas: A 1 cm thin walled cystic lesion of 5.7 Hounsfield units has developed of the anterior aspect of the head of the pancreas (series 3 axial 30). The remaining pancreas is unremarkable. There is no ductal dilatation or edema. Spleen: No mass enhancement or splenomegaly. Adrenals/Urinary Tract: There is no adrenal mass. A 1 cm fatty angiomyolipoma is again noted laterally in the right kidney, lower pole, stable. There is a 6 mm too small to characterize hypodensity in the posterior lower left kidney not seen previously. Both kidneys are otherwise unremarkable. There are no stones, hydroureteronephrosis  or bladder thickening. Stomach/Bowel: No dilatation or wall thickening including the appendix. The small bowel is normal caliber. There are colonic diverticula without evidence of colitis or diverticulitis. Vascular/Lymphatic: Abdominal aorta is tortuous with patchy calcific atherosclerosis extending into the iliacs. There is no AAA. No enlarged lymph nodes are seen. Reproductive: The uterus is absent.  The ovaries are not enlarged. Other: There is no free air, hemorrhage, free fluid or acute inflammatory change. There are small umbilical and inguinal fat hernias which were seen previously. Musculoskeletal: There is advanced degenerative change of the spine, moderate hip DJD. Osteopenia. Moderate acquired spinal stenosis L4-5. Multilevel foraminal stenosis. IMPRESSION: 1. No acute abnormality demonstrated in the abdomen or pelvis. 2. Diverticulosis without evidence of diverticulitis. 3. Query early changes of hepatic cirrhosis, mild hepatic steatosis. No portal vein dilatation. No splenomegaly. 4. Stable 1 cm right renal angiomyolipoma, with single subcentimeter too small to characterize hypodensity newly seen in the left kidney. No acute urinary tract findings. 5. Aortic atherosclerosis.  No AAA. 6. Umbilical and inguinal fat hernias. 7. Three-vessel heavy calcific CAD.  Mild cardiomegaly. 8. 1 cm thin walled cystic lesion developed in the anterior aspect of the pancreatic head, probable pseudocyst or side branch IPMN. Pancreas dedicated MRI recommended. Electronically Signed   By: Telford Nab M.D.   On: 05/31/2021 06:01    Procedures Procedures    Medications Ordered in ED Medications  morphine (PF) 4 MG/ML injection 4 mg (4 mg Intravenous Given 05/31/21 0504)  ondansetron (ZOFRAN) injection 4 mg (4 mg Intravenous Given 05/31/21 0503)  sodium chloride 0.9 % bolus 500 mL (0 mLs Intravenous Stopped 05/31/21 0715)  iohexol (OMNIPAQUE) 300 MG/ML solution 80 mL (80 mLs Intravenous Contrast Given 05/31/21 0537)   oxyCODONE-acetaminophen (PERCOCET/ROXICET) 5-325 MG per tablet 1 tablet (1 tablet Oral Given 05/31/21 9476)    ED Course/ Medical Decision Making/ A&P                           Medical Decision Making Amount and/or Complexity of Data Reviewed Labs: ordered. Radiology: ordered.  Risk Prescription drug management.   This patient presents to the ED for concern of flank pain, this involves an extensive number of treatment options, and is a complaint that carries with it a high risk of complications and morbidity.  The differential diagnosis includes kidney stone, UTI, diverticulitis,  prodrome to shingles  MDM:    Patient presents with flank pain.  She reports several week history of the same.  She is nontoxic-appearing.  Vital signs are notable for blood pressure of 185/74.  Denies urinary symptoms.  Denies fevers.  She has been taking Tylenol with minimal relief.  She has no overlying rash to suggest shingles although prodrome to shingles would be a possibility.  She has no CVA tenderness.  Urinalysis is not consistent with UTI.  CT scan came.  No evidence of kidney stone.  She has diverticulosis without evidence of diverticulitis.  She has multiple incidental findings which I do not feel account for the patient's pain.  She was initially given IV pain medication.  She was transitioned to oral pain medication.  I discussed with her the relatively normal work-up.  Could be musculoskeletal as well.  Will trial a course of anti-inflammatories and Percocet.  Recommend follow-up with her primary physician. (Labs, imaging)  Labs: I Ordered, and personally interpreted labs.  The pertinent results include: Negative urinalysis, reassuring CBC and BMP  Imaging Studies ordered: I ordered imaging studies including CT scan I independently visualized and interpreted imaging. I agree with the radiologist interpretation  Additional history obtained from chart review.  External records from outside source  obtained and reviewed including prior visits  Critical Interventions: IV pain medication  Consultations: I requested consultation with the none,  and discussed lab and imaging findings as well as pertinent plan - they recommend: None  Cardiac Monitoring: The patient was maintained on a cardiac monitor.  I personally viewed and interpreted the cardiac monitored which showed an underlying rhythm of: N/A  Reevaluation: After the interventions noted above, I reevaluated the patient and found that they have :improved   Considered admission for: Intractable pain  Social Determinants of Health: Lives independently  Disposition: Discharge  Co morbidities that complicate the patient evaluation  Past Medical History:  Diagnosis Date   Abdominal aortic atherosclerosis ([place]) by CT 02/2014   Acute kidney injury ([place]) 02/29/2016   During hospitalization 07/2017   CAD (coronary artery disease)    by CT, per pt h/o MI   Cataract    bilateral-removed   Clotting disorder ([place])    h/o PE   Diabetes type 2, uncontrolled    Frequent headaches    GERD (gastroesophageal reflux disease)    Gout    History of pulmonary embolism 2012   HLD (hyperlipidemia)    HTN (hypertension)    Internal capsule hemorrhage ([facility])    hx of sublacunar infarct involving the right posterior limb of the internal capsule    Morbid obesity ([place])    Myocardial infarction ([place]) 2012   per pt. report, states she was treated with medicine, here at [facility]     Osteoarthritis    knees   Primary localized osteoarthritis of left knee 09/22/2014   Risk for falls 09/28/2017   Sleep apnea 2011   study done in [place], states that since she lost weight she doesn't use the CPAP any longer & she doesn't have a problem with sleep apnea   Stroke [facility])    still has balance problem on occas. , uses cane but that's mainly for the left knee pain   Syncope 03/01/2016   Thoracic aortic atherosclerosis ([place]) 12/2015   by CXR    Vertigo    hx. benign postitional postural     Medicines Meds ordered this encounter  Medications   morphine (PF) 4 MG/ML injection  4 mg   ondansetron (ZOFRAN) injection 4 mg   sodium chloride 0.9 % bolus 500 mL   iohexol (OMNIPAQUE) 300 MG/ML solution 80 mL   DISCONTD: iohexol (OMNIPAQUE) 300 MG/ML solution 80 mL   oxyCODONE-acetaminophen (PERCOCET/ROXICET) 5-325 MG per tablet 1 tablet   oxyCODONE-acetaminophen (PERCOCET/ROXICET) 5-325 MG tablet    Sig: Take 1 tablet by mouth every 6 (six) hours as needed for severe pain.    Dispense:  6 tablet    Refill:  0    I have reviewed the patients home medicines and have made adjustments as needed  Problem List / ED Course: Problem List Items Addressed This Visit   None Visit Diagnoses     Flank pain    -  Primary                   Final Clinical Impression(s) / ED Diagnoses Final diagnoses:  Flank pain    Rx / DC Orders ED Discharge Orders          Ordered    oxyCODONE-acetaminophen (PERCOCET/ROXICET) 5-325 MG tablet  Every 6 hours PRN        05/31/21 0705              Merryl Hacker, MD 06/01/21 [phone]

## 2021-05-31 NOTE — Assessment & Plan Note (Addendum)
Update levels on b12 1032mcg daily replacement.

## 2021-06-01 NOTE — Telephone Encounter (Addendum)
Please proceed with Jardiance PAP through [facility]. Ask patient how she would like to complete forms (in office preferred).  [name], PharmD Clinical Pharmacist Practitioner [facility] at [facility] [phone]

## 2021-06-06 NOTE — Telephone Encounter (Signed)
Contacted the patient. She will be in area on this Wednesday 06/08/21. She will stop by the office at [facility], to sign the renewal forms for basaglar and jardiance patient assistance.  [name], CPP notified  Avel Sensor, [place] Assistant [phone]  Total time spent for month CPA: 10 min.

## 2021-06-07 NOTE — Telephone Encounter (Signed)
Reviewed forms and sent to [facility] for printing.

## 2021-06-08 NOTE — Telephone Encounter (Signed)
Clinical cytogeneticist forms for [employer] practitioner) and [employer] ([employer] Placed forms in front office "patient documents" file for patient to come sign and leave proof of income.  Please return forms to me once complete.

## 2021-06-10 NOTE — Telephone Encounter (Signed)
Received signed documents from patient. It appears she did not bring proof of income to the office. We will need income documentation to complete applications.

## 2021-06-13 NOTE — Telephone Encounter (Signed)
Spoke with the patient . Discussed importance of finding 1099 SS form to bring to office for information needed with PCP forms. The patient found old form while on the phone and will look for the last one she got and bring to office this week to make a copy .   Avel Sensor, [place] Assistant [phone]

## 2021-06-15 ENCOUNTER — Telehealth: Payer: Self-pay

## 2021-06-15 NOTE — Telephone Encounter (Signed)
Patient assistance forms have been placed in Dr Synthia Innocent folder in front office for signature.

## 2021-06-15 NOTE — Progress Notes (Signed)
° ° °  Chronic Care Management Pharmacy Assistant   Name: CECILLIA MENEES  MRN: [id number] DOB: 05-01-1940  Reason for Encounter: CCM (Jardiance and Basaglar PAP forms 2023)   Patient states she is going to come to the office tomorrow morning 06/16/2021 and bring her proof of income. Patient came to office on Tuesday, but did not bring income with her.   Kristen [name], CPP notified  Marijean Niemann, [place] Clinical Pharmacy Assistant [phone]  Time Spent: 10 Minutes

## 2021-06-16 NOTE — Telephone Encounter (Signed)
Filled and in my outbox

## 2021-06-16 NOTE — Telephone Encounter (Signed)
Faxed completed applications to respective companies Will follow up with companies in 5-7 business days

## 2021-06-16 NOTE — Telephone Encounter (Signed)
Placed in Lindsey's folder at front office.

## 2021-06-17 NOTE — Progress Notes (Signed)
I have added this to my schedule for 06/24/2021. °

## 2021-06-28 ENCOUNTER — Encounter (HOSPITAL_COMMUNITY): Payer: Self-pay | Admitting: Radiology

## 2021-07-21 ENCOUNTER — Telehealth: Payer: Self-pay

## 2021-07-21 NOTE — Progress Notes (Signed)
? ? ?  Chronic Care Management ?Pharmacy Assistant  ? ?Name: GENAVIE BOETTGER  MRN: [id number] DOB: 1940/12/24 ? ?Reason for Encounter: CCM (Jardiance 2023 PAP Denial) ? ?Patient has been denied for Jardiance patient assistance for 2023 through [employer]. Due to patient qualifying for low income subsidy she has been denied.  ? ?[name], CPP notified ? ?Marijean Niemann, RMA ?Clinical Pharmacy Assistant ?[phone] ? ? ?  ? ?

## 2021-07-21 NOTE — Progress Notes (Addendum)
? ? ?  Chronic Care Management ?Pharmacy Assistant  ? ?Name: Kristen [name]  MRN: [id number] DOB: 23-May-1940 ? ?Reason for Encounter: CCM (Basaglar 2023 PAP Approved) ?  ? ?Patient has been approved for [employer] with [employer] through 04/23/2022.  ? ? ?[name], CPP notified ? ?Marijean Niemann, RMA ?Clinical Pharmacy Assistant ?[phone] ? ? ?

## 2021-07-21 NOTE — Progress Notes (Signed)
Patient has been denied for Jardiance patient assistance for 2023 through [facility]. Due to patient qualifying for low income subsidy she has been denied.  ? ?

## 2021-08-08 ENCOUNTER — Other Ambulatory Visit: Payer: Self-pay | Admitting: *Deleted

## 2021-08-08 NOTE — Patient Outreach (Signed)
[facility]) Care Management ?RN Health Coach Note ? ? ?08/08/2021 ?Name:  Kristen [name] MRN:  [id number] DOB:  [birth year] ? ?Summary: ?Patient had not checked her FBS today. Patient is just getting over shingles. She has received the first shingle shot. Patient A1C is 9.2 elevated from 8.0. ? ?Recommendations/Changes made from today's visit: ?Start and Exercise Routine ?Monitor blood sugars ?Medication adherence ? ?Subjective: ?Kristen [name] is an 81 y.o. year old female who is a primary patient of [name], MD. The care management team was consulted for assistance with care management and/or care coordination needs.   ? ?RN Health Coach completed Telephone Visit today.  ? ?Objective: ? ?Medications Reviewed Today   ? ? Reviewed by Lannette Donath, CPhT (Pharmacy Technician) on 05/31/21 at [place]  Med List Status: Complete  ? ?Medication Order Taking? Sig Documenting Provider Last Dose Status Informant  ?0.9 %  sodium chloride infusion [id number]   Armbruster, Carlota Raspberry, MD  Active   ?amLODipine (NORVASC) 5 MG tablet [id number] Yes TAKE ONE TABLET BY MOUTH DAILY  ?Patient taking differently: Take 5 mg by mouth daily.  ? [name], MD 05/30/2021 Active   ?atorvastatin (LIPITOR) 40 MG tablet [id number] Yes TAKE ONE TABLET BY MOUTH ONCE DAILY  ?Patient taking differently: Take 40 mg by mouth daily.  ? [name], MD 05/30/2021 Active   ?bismuth-metronidazole-tetracycline [facility]) [phone] MG capsule [id number] No Take 3 capsules by mouth 4 (four) times daily -  before meals and at bedtime.  ?Patient not taking: Reported on 05/31/2021  ? [name], MD Completed Course Active   ?Blood Glucose Monitoring Suppl (ONETOUCH VERIO) w/Device KIT [id number] Yes Use to check blood sugar up to 4 times a day as directed [name], MD  Active   ?Cholecalciferol (VITAMIN D) 2000 units CAPS [id number] Yes Take 2,000 Units by mouth daily. [provider] 05/30/2021 Active Self  ?          ?Med Note Nadara Eaton Feb 20, 2018 11:50 AM)    ?clopidogrel (PLAVIX) 75 MG tablet [id number] Yes Take 1 tablet (75 mg total) by mouth daily. [name], MD 05/30/2021 Active   ?Colchicine (MITIGARE) 0.6 MG CAPS [id number] Yes Take 1 tablet by mouth daily as needed (for gout). [name], MD 05/30/2021 Active   ?glucose blood [facility] VERIO) test strip [id number]  Check blood sugar once daily [name], MD  Active   ?hydrALAZINE (APRESOLINE) 50 MG tablet [id number] Yes Take 1 tablet (50 mg total) by mouth 2 (two) times daily. [name], MD 05/30/2021 Active   ?hydrochlorothiazide (HYDRODIURIL) 12.5 MG tablet [id number] Yes Take 12.5 mg by mouth every morning. [provider] 05/30/2021 Active   ?Insulin Glargine (BASAGLAR KWIKPEN) 100 UNIT/ML [id number] Yes Inject 18 Units into the skin at bedtime. [provider] 05/29/2021 Active Self  ?Insulin Pen Needle (B-D UF III MINI PEN NEEDLES) 31G X 5 MM MISC [id number] Yes Use once daily [name], MD  Active   ?Lancets (ONETOUCH ULTRASOFT) lancets [id number] Yes Use to check blood sugar up to 4 times a day as directed [name], MD  Active   ?losartan (COZAAR) 100 MG tablet [id number] Yes Take 100 mg by mouth every morning. [provider] 05/30/2021 Active   ?metoprolol succinate (TOPROL-XL) 25 MG 24 hr tablet [id number] Yes Take 0.5 tablets (12.5 mg total) by mouth daily. [name], MD 05/30/2021 1130 Active   ?nitroGLYCERIN (NITROSTAT) 0.4 MG SL tablet [id number] Yes Place  0.4 mg under the tongue every 5 (five) minutes as needed for chest pain. [provider] unk Active Self  ?         ?Med Note [place] Crane, MISTY D   Tue May 31, 2021  4:57 AM)    ?nystatin cream (MYCOSTATIN) [id number] Yes Apply 1 application topically 2 (two) times daily.  ?Patient taking differently: Apply 1 application topically daily as needed for dry skin (rash).  ? [name], MD unk Active   ?potassium  chloride (KLOR-CON) 10 MEQ tablet [id number] Yes Take 1 tablet (10 mEq total) by mouth every Monday, Wednesday, and Friday. [name], MD 05/30/2021 Active   ?vitamin B-12 (CYANOCOBALAMIN) 1000 MCG tablet [id number] Yes Take 1 tablet (1,000 mcg total) by mouth daily. [name], MD 05/30/2021 Active   ?Med List Note [name] 03/16/20 2058): [phone] ? ?[id number]  ? ?  ?  ? ?  ? ? ? ?SDOH:  (Social Determinants of Health) assessments and interventions performed:  ?SDOH Interventions   ? ?Flowsheet Row Most Recent Value  ?SDOH Interventions   ?Food Insecurity Interventions Intervention Not Indicated  ?Housing Interventions Intervention Not Indicated  ?Transportation Interventions Intervention Not Indicated  ? ?  ? ? ?Care Plan ? ?Review of patient past medical history, allergies, medications, health status, including review of consultants reports, laboratory and other test data, was performed as part of comprehensive evaluation for care management services.  ? ?Care Plan : RN Care Manager Plan of Care  ?Updates made by Earley Grobe, Eppie Gibson, RN since 08/08/2021 12:00 AM  ?  ? ?Problem: Knowledge Deficit Related to Diabetes and Care Coordination Needs   ?Priority: High  ?  ? ?Long-Range Goal: Development Plan of Care for Management of Diabetes   ?Start Date: 05/09/2021  ?Expected End Date: 04/22/2022  ?Priority: High  ?Note:   ?Current Barriers:  ?Knowledge Deficits related to plan of care for management of DMII  ? ?RNCM Clinical Goal(s):  ?Patient will verbalize understanding of plan for management of DMII as evidenced by continuation of monitoring blood sugars and adhering to diabetic diet  through collaboration with RN Care manager, provider, and care team.  ? ?Inter-disciplinary care team collaboration (see longitudinal plan of care) ?Evaluation of current treatment plan related to  self management and patient's adherence to plan as established by provider ? ?Patient Goals/Self-Care  Activities: ?Take medications as prescribed   ?Attend all scheduled provider appointments ?Call pharmacy for medication refills 3-7 days in advance of running out of medications ?Attend church or other social activities ?Perform all self care activities independently  ?Perform IADL's (shopping, preparing meals, housekeeping, managing finances) independently ?Call provider office for new concerns or questions  ?call the Suicide and Crisis Lifeline: 988 if experiencing a Mental Health or [place]  ?check blood sugar at prescribed times: once daily ?check feet daily for cuts, sores or redness ?take the blood sugar meter to all doctor visits ?trim toenails straight across ?drink 6 to 8 glasses of water each day ?manage portion size ?switch to sugar-free drinks ?wash and dry feet carefully every day ?Start an exercise routine ? [id number] Patient A1C has elevated to 9.2 from 8.0. Per patient she is just getting over shingles. Patient had just gotten up and had not checked her blood sugars or eaten. Patient does not have an exercise routine. RN discussed the importance of starting an exercise routine.  ?  ?  ? ?Plan: Telephone follow up appointment with care management team member scheduled  for:  October 10, 2021 ?The patient has been provided with contact information for the care management team and has been advised to call with any health related questions or concerns.  ? ? ?Johny Shock BSN RN ?[place] Management ?[phone] ? ? ? ?  ?

## 2021-08-08 NOTE — Patient Instructions (Signed)
Visit Information ? ?Thank you for taking time to visit with me today. Please don't hesitate to contact me if I can be of assistance to you before our next scheduled telephone appointment. ? ?Following are the goals we discussed today:  ?Current Barriers:  ?Knowledge Deficits related to plan of care for management of DMII  ? ?RNCM Clinical Goal(s):  ?Patient will verbalize understanding of plan for management of DMII as evidenced by continuation of monitoring blood sugars and adhering to diabetic diet through collaboration with RN Care manager, provider, and care team.  ? ?Inter-disciplinary care team collaboration (see longitudinal plan of care) ?Evaluation of current treatment plan related to  self management and patient's adherence to plan as established by provider ? ?Patient Goals/Self-Care Activities: ?Take medications as prescribed   ?Attend all scheduled provider appointments ?Call pharmacy for medication refills 3-7 days in advance of running out of medications ?Attend church or other social activities ?Perform all self care activities independently  ?Perform IADL's (shopping, preparing meals, housekeeping, managing finances) independently ?Call provider office for new concerns or questions  ?call the Suicide and Crisis Lifeline: 988 if experiencing a Mental Health or [place]  ?check blood sugar at prescribed times: once daily ?check feet daily for cuts, sores or redness ?take the blood sugar meter to all doctor visits ?trim toenails straight across ?drink 6 to 8 glasses of water each day ?manage portion size ?switch to sugar-free drinks ?wash and dry feet carefully every day ?Start an exercise routine ? [id number] Patient A1C has elevated to 9.2 from 8.0. Per patient she is just getting over shingles. Patient had just gotten up and had not checked her blood sugars or eaten. Patient does not have an exercise routine. RN discussed the importance of starting an exercise routine.  ? ?Our next  appointment is by telephone on 10/10/2021 ? ?Please call Johny Shock RN [phone] if you need to cancel or reschedule your appointment.  ? ?Please call the Suicide and Crisis Lifeline: 988 if you are experiencing a Mental Health or [place] or need someone to talk to. ? ?The patient verbalized understanding of instructions, educational materials, and care plan provided today and agreed to receive a mailed copy of patient instructions, educational materials, and care plan.  ? ?Telephone follow up appointment with care management team member scheduled for: ?The patient has been provided with contact information for the care management team and has been advised to call with any health related questions or concerns.  ? ?SIGNATURE ? ?Johny Shock BSN RN ?[facility] Management ?[phone] ? ? ?  ?

## 2021-08-26 ENCOUNTER — Telehealth: Payer: Self-pay

## 2021-08-26 ENCOUNTER — Other Ambulatory Visit: Payer: Self-pay | Admitting: Family Medicine

## 2021-08-26 DIAGNOSIS — E1165 Type 2 diabetes mellitus with hyperglycemia: Secondary | ICD-10-CM

## 2021-08-26 NOTE — Telephone Encounter (Signed)
Plavix ?Last filled:  06/07/21, #90 ?Last OV:  05/30/21, AWV prt 2 ?Next OV:  11/28/21, 6 mo f/u ?

## 2021-08-26 NOTE — Chronic Care Management (AMB) (Signed)
? ? ?Chronic Care Management ?Pharmacy Assistant  ? ?Name: Kristen [name]  MRN: [id number] DOB: November 24, 1940 ? ? ?Reason for Encounter: Medication Adherence and Delivery Coordination ?  ?Recent office visits:  ?None since last CCM contact ? ?Recent consult visits:  ?05/30/21-Kristen Horton,MD([facility])-abdominal pain-shingles,short dose pain medication IV, oral, labs and xrays,discharged to home f/u PCP ? ?Hospital visits:  ?None in previous 6 months ? ?Medications: ?Outpatient Encounter Medications as of 08/26/2021  ?Medication Sig  ? amLODipine (NORVASC) 5 MG tablet TAKE ONE TABLET BY MOUTH ONCE DAILY  ? atorvastatin (LIPITOR) 40 MG tablet TAKE ONE TABLET BY MOUTH ONCE DAILY  ? bismuth-metronidazole-tetracycline (PYLERA) 140-125-125 MG capsule Take 3 capsules by mouth 4 (four) times daily -  before meals and at bedtime. (Patient not taking: Reported on 05/31/2021)  ? Blood Glucose Monitoring Suppl (ONETOUCH VERIO) w/Device KIT Use to check blood sugar up to 4 times a day as directed  ? Cholecalciferol (VITAMIN D) 2000 units CAPS Take 2,000 Units by mouth daily.  ? clopidogrel (PLAVIX) 75 MG tablet Take 1 tablet (75 mg total) by mouth daily.  ? Colchicine (MITIGARE) 0.6 MG CAPS Take 1 tablet by mouth daily as needed (for gout).  ? empagliflozin (JARDIANCE) 10 MG TABS tablet Take 1 tablet (10 mg total) by mouth daily before breakfast.  ? glucose blood (ONETOUCH VERIO) test strip USE TO check blood glucose four times daily AS DIRECTED  ? hydrALAZINE (APRESOLINE) 50 MG tablet Take 1 tablet (50 mg total) by mouth 2 (two) times daily.  ? hydrochlorothiazide (HYDRODIURIL) 12.5 MG tablet Take 12.5 mg by mouth every morning.  ? Insulin Glargine (BASAGLAR KWIKPEN) 100 UNIT/ML Inject 18 Units into the skin at bedtime.  ? Insulin Pen Needle (B-D UF III MINI PEN NEEDLES) 31G X 5 MM MISC Use once daily  ? Lancets (ONETOUCH DELICA PLUS [id number]) MISC USE AS DIRECTED TO check blood sugar FOUR TIMES DAILY  ? losartan (COZAAR)  100 MG tablet Take 100 mg by mouth every morning.  ? metoprolol succinate (TOPROL-XL) 25 MG 24 hr tablet TAKE ONE TABLET BY MOUTH ONCE DAILY  ? nitroGLYCERIN (NITROSTAT) 0.4 MG SL tablet Place 0.4 mg under the tongue every 5 (five) minutes as needed for chest pain.  ? nystatin cream (MYCOSTATIN) Apply 1 application topically 2 (two) times daily. (Patient taking differently: Apply 1 application topically daily as needed for dry skin (rash).)  ? oxyCODONE-acetaminophen (PERCOCET/ROXICET) 5-325 MG tablet Take 1 tablet by mouth every 6 (six) hours as needed for severe pain.  ? potassium chloride (KLOR-CON) 10 MEQ tablet TAKE ONE TABLET BY MOUTH ONCE WEEKLY ON MONDAY and TAKE ONE TABLET BY MOUTH ONCE WEEKLY ON WEDNESDAY and TAKE ONE TABLET BY MOUTH ONCE WEEKLY ON FRIDAY  ? vitamin B-12 (CYANOCOBALAMIN) 1000 MCG tablet Take 1 tablet (1,000 mcg total) by mouth daily.  ? ?Facility-Administered Encounter Medications as of 08/26/2021  ?Medication  ? 0.9 %  sodium chloride infusion  ? ? ?BP Readings from Last 3 Encounters:  ?05/31/21 (!) 187/70  ?05/30/21 (!) 166/80  ?01/05/21 136/68  ?  ?Lab Results  ?Component Value Date  ? HGBA1C 9.2 (H) 05/30/2021  ?  ? ? ?Recent OV, Consult or Hospital visit: 05/30/21  [name] for shingles ?No medication changes indicated ? ? ?Last adherence delivery date:06/09/21     ? ?Patient is due for next adherence delivery on: 09/07/21 ? ?Spoke with patient on 08/26/21 reviewed medications and coordinated delivery. ? ?This delivery to include: Adherence Packaging  90 Days  ?Packs: ?Atorvastatin 40 mg 1 tablet daily (breakfast) ?Metoprolol Succinate 25 mg take 1/2  tablet daily (breakfast)  ?Hydralazine 50 mg 1 tablet twice daily (1-breakfast, 1-bedtime) ?Clopidogrel 75 mg 1 tablet daily (breakfast) ?Potassium chloride ER 10 Meq 1 tablet daily on Mon, Wed, Friday (breakfast) ?Amlodipine 5 mg 1 tablet daily (breakfast) ?Losartan 100 mg 1 tablet daily (breakfast ?Hydrochlorothiazide 12.5 mg 1 tablet daily  (breakfast) ? ?VIAL medications: ?Pen needles use as directed once daily at bedtime ?One touch delica plus lancets 69G ? ?Patient declined the following medications this month: ?One touch verio test strips-patient has supply on hand  ?Colchicine 0.16m- patient has supply on hand for prn use ?Jardiance 123m patient is not taking at this time, trial use- no change in symptoms, unable to afford medication ?Basaglar - manufacturer ? ?Any concerns about your medications? No- discussed Jardiance and patient reports she was doing a 30 day trial for Dr.G. and did not see any change in BG's . Not interested in being on that medication. ? ?How often do you forget or accidentally miss a dose? Never ? ? ?Is patient in packaging Yes ? What is the date on your next pill pack? The box has April 2023 ? Any concerns or issues with your packaging? The patient is happy with [facility] packaging  ? ? ?Refills requested from providers include:hydralazine,losartan,hydrochlorothiazide,clopidogrel,one touch lancets, ? ?Confirmed delivery date of 09/07/21, advised patient that pharmacy will contact them the morning of delivery. ? ?Recent blood pressure readings are as follows: none available-granddaughter does check intermittently- patient will ask her to document readings in the future ? ? ?Recent blood glucose readings are as follows: ?Fasting:08/26/21-119 ?Bedtime:08/25/21-130 ? ? ?Annual wellness visit in last year? Yes ?Most Recent BP reading: ? ?If Diabetic: ?Most recent A1C reading:9.2  patient states this was during shingles flare up and she was in pain and taking lots of medications for the pain  ? ?Last eye exam / retinopathy screening: UTD ?Last diabetic foot exam: UTD ? ?LiCharlene BrookeCPP notified ? ?Kristen [name], CCMA ?Health concierge  ?[phone]?

## 2021-08-26 NOTE — Telephone Encounter (Signed)
ERx 

## 2021-08-26 NOTE — Telephone Encounter (Signed)
Noted. Thanks.

## 2021-08-26 NOTE — Telephone Encounter (Signed)
Patient did a 30-ds trial of Jardiance and reports no change in BG readings. Since she was denied PAP and copay is high she would prefer not to continue medication. ? ?Removed Jardiance from list. Scheduled CCM F/U to discuss alternatives. ?

## 2021-08-26 NOTE — Addendum Note (Signed)
Addended by: Charlton Haws on: 08/26/2021 04:09 PM ? ? Modules accepted: Orders ? ?

## 2021-09-12 ENCOUNTER — Telehealth: Payer: Self-pay

## 2021-09-12 NOTE — Chronic Care Management (AMB) (Signed)
Chronic Care Management Pharmacy Assistant   Name: Kristen [name]  MRN: [id number] DOB: 29-Jun-1940  Reason for Encounter: Reminder Call   Conditions to be addressed/monitored: CAD, HTN, HLD, and DMII  Medications: Outpatient Encounter Medications as of 09/12/2021  Medication Sig   clopidogrel (PLAVIX) 75 MG tablet TAKE ONE TABLET BY MOUTH EVERY MORNING   amLODipine (NORVASC) 5 MG tablet TAKE ONE TABLET BY MOUTH ONCE DAILY   atorvastatin (LIPITOR) 40 MG tablet TAKE ONE TABLET BY MOUTH ONCE DAILY   bismuth-metronidazole-tetracycline (PYLERA) 140-125-125 MG capsule Take 3 capsules by mouth 4 (four) times daily -  before meals and at bedtime. (Patient not taking: Reported on 05/31/2021)   Blood Glucose Monitoring Suppl (ONETOUCH VERIO) w/Device KIT Use to check blood sugar up to 4 times a day as directed   Cholecalciferol (VITAMIN D) 2000 units CAPS Take 2,000 Units by mouth daily.   Colchicine (MITIGARE) 0.6 MG CAPS Take 1 tablet by mouth daily as needed (for gout).   glucose blood (ONETOUCH VERIO) test strip USE TO check blood glucose AS DIRECTED four times daily   hydrALAZINE (APRESOLINE) 50 MG tablet TAKE ONE TABLET BY MOUTH EVERY MORNING and TAKE ONE TABLET BY MOUTH EVERYDAY AT BEDTIME   hydrochlorothiazide (HYDRODIURIL) 12.5 MG tablet TAKE ONE TABLET BY MOUTH EVERY MORNING   Insulin Glargine (BASAGLAR KWIKPEN) 100 UNIT/ML Inject 18 Units into the skin at bedtime.   Insulin Pen Needle (B-D UF III MINI PEN NEEDLES) 31G X 5 MM MISC Use once daily   Lancets (ONETOUCH DELICA PLUS [id number]) MISC USE TO check blood glucose AS DIRECTED four times daily   losartan (COZAAR) 100 MG tablet TAKE ONE TABLET BY MOUTH EVERY MORNING   metoprolol succinate (TOPROL-XL) 25 MG 24 hr tablet TAKE ONE TABLET BY MOUTH ONCE DAILY   nitroGLYCERIN (NITROSTAT) 0.4 MG SL tablet Place 0.4 mg under the tongue every 5 (five) minutes as needed for chest pain.   nystatin cream (MYCOSTATIN) Apply 1 application  topically 2 (two) times daily. (Patient taking differently: Apply 1 application topically daily as needed for dry skin (rash).)   oxyCODONE-acetaminophen (PERCOCET/ROXICET) 5-325 MG tablet Take 1 tablet by mouth every 6 (six) hours as needed for severe pain.   potassium chloride (KLOR-CON) 10 MEQ tablet TAKE ONE TABLET BY MOUTH ONCE WEEKLY ON MONDAY and TAKE ONE TABLET BY MOUTH ONCE WEEKLY ON WEDNESDAY and TAKE ONE TABLET BY MOUTH ONCE WEEKLY ON FRIDAY   vitamin B-12 (CYANOCOBALAMIN) 1000 MCG tablet Take 1 tablet (1,000 mcg total) by mouth daily.   Facility-Administered Encounter Medications as of 09/12/2021  Medication   0.9 %  sodium chloride infusion   Kristen [name] was contacted to remind of upcoming telephone visit with Kristen [name] on 09/14/21 at 1:30pm. Patient was reminded to have any blood glucose and blood pressure readings available for review at appointment.   Patient confirmed appointment.   Are you having any problems with your medications? No  Patient reports she did a 30 day trial of Jardiance and did not see any changes, does not want to renew this medication   Do you have any concerns you like to discuss with the pharmacist? No   CCM referral has been placed prior to visit?  Yes    Star Rating Drugs: Medication:  Last Fill: Day Supply  [facility]  Losartan 157m 06/07/21 90 Atorvstatin 458m2/14/23 90 Basaglar [employer] International[employer]urer  LiSmith InternationalCPP notified  VeAvel [phone]

## 2021-09-14 ENCOUNTER — Ambulatory Visit (INDEPENDENT_AMBULATORY_CARE_PROVIDER_SITE_OTHER): Payer: Medicare Other | Admitting: Pharmacist

## 2021-09-14 DIAGNOSIS — E1165 Type 2 diabetes mellitus with hyperglycemia: Secondary | ICD-10-CM

## 2021-09-14 DIAGNOSIS — E1169 Type 2 diabetes mellitus with other specified complication: Secondary | ICD-10-CM

## 2021-09-14 DIAGNOSIS — I10 Essential (primary) hypertension: Secondary | ICD-10-CM

## 2021-09-14 DIAGNOSIS — I2510 Atherosclerotic heart disease of native coronary artery without angina pectoris: Secondary | ICD-10-CM

## 2021-09-14 NOTE — Progress Notes (Signed)
Chronic Care Management Pharmacy Note  09/14/2021 Name:  NAMIYAH GRANTHAM MRN:  [id number] DOB:  01-19-1941  Summary: CCM F/U visit -Reviewed medications, pt affirms adherence with pill packs -DM: A1c 9.2 (05/2021), pt stopped Jardiance and BG at home is reasonable (fasting 110s, bedtime <200) -HTN: BP in recent OV was elevated in s/o acute pain, pt does not check at home regularly but her granddaughter (a nurse) checks occasionally and always tells her BP is OK  Recommendations/Changes made from today's visit: -Advised to monitor BP at home and contact PCP if BP > 150/90 consistently -No med changes  Plan: -[place] will call patient 2 months for DM update -Pharmacist follow up televisit scheduled for 4 months -PCP F/U 11/28/21   Subjective: Jaelyne Deeg Ruddick is an 81 y.o. year old female who is a primary patient of [name], MD.  The CCM team was consulted for assistance with disease management and care coordination needs.    Engaged with patient by telephone for follow up visit in response to provider referral for pharmacy case management and/or care coordination services.   Consent to Services:  The patient was given information about Chronic Care Management services, agreed to services, and gave verbal consent prior to initiation of services.  Please see initial visit note for detailed documentation.   Patient Care Team: [name], MD as PCP - General (Family Medicine) Rockey Situ Kathlene November, MD as Consulting Physician (Cardiology) Pleasant, Eppie Gibson, RN as [facility] Charlton Haws, [facility] as Pharmacist (Pharmacist)  Recent office visits: 05/30/21 Dr [name] OV: annual exam - A1c 9.2. Start Jardiance 10 mg. Start  Colchicine 0.6 mg PRN for gout flare.  Recent consult visits: [facility] visits: 05/30/21 ED visit [facility]): flank pain. Rx'd oxycodone-apap 5-325 mg #6.   Objective:  Lab Results  Component Value Date    CREATININE 1.06 (H) 05/30/2021   BUN 22 05/30/2021   GFR 59.88 (L) 05/30/2021   GFRNONAA 53 (L) 05/30/2021   GFRAA >60 02/20/2018   NA 139 05/30/2021   K 3.4 (L) 05/30/2021   CALCIUM 9.5 05/30/2021   CO2 27 05/30/2021   GLUCOSE 246 (H) 05/30/2021    Lab Results  Component Value Date/Time   HGBA1C 9.2 (H) 05/30/2021 10:46 AM   HGBA1C 8.0 (A) 01/05/2021 11:15 AM   HGBA1C 10.7 (H) 03/16/2020 05:37 AM   FRUCTOSAMINE 497 (H) 03/30/2020 10:36 AM   GFR 59.88 (L) 05/30/2021 10:46 AM   GFR 46.30 (L) 01/05/2021 11:45 AM   MICROALBUR 11.5 (H) 05/30/2021 10:46 AM   MICROALBUR 7.8 (H) 08/09/2016 10:28 AM    Last diabetic Eye exam:  Lab Results  Component Value Date/Time   HMDIABEYEEXA No Retinopathy 04/26/2021 12:00 AM    Last diabetic Foot exam: No results found for: HMDIABFOOTEX   Lab Results  Component Value Date   CHOL 134 05/30/2021   HDL 41.40 05/30/2021   LDLCALC 71 05/30/2021   LDLDIRECT 102.0 12/31/2015   TRIG 110.0 05/30/2021   CHOLHDL 3 05/30/2021       Latest Ref Rng & Units 05/30/2021    8:15 PM 05/30/2021   10:46 AM 03/30/2020   10:36 AM  Hepatic Function  Total Protein 6.5 - 8.1 g/dL 6.4   6.9   7.1    Albumin 3.5 - 5.0 g/dL 3.2   3.9   4.0    AST 15 - 41 U/L '22   17   15    ' ALT  0 - 44 U/L '22   18   21    ' Alk Phosphatase 38 - 126 U/L 86   92   117    Total Bilirubin 0.3 - 1.2 mg/dL 0.4   0.7   1.0      Lab Results  Component Value Date/Time   TSH 4.88 05/30/2021 10:46 AM   TSH 3.71 01/05/2021 11:45 AM   FREET4 0.94 05/30/2021 10:46 AM   FREET4 0.90 01/05/2021 11:45 AM       Latest Ref Rng & Units 05/30/2021    8:15 PM 05/30/2021   10:46 AM 01/05/2021   11:45 AM  CBC  WBC 4.0 - 10.5 K/uL 7.5   7.3   8.4    Hemoglobin 12.0 - 15.0 g/dL 10.7   11.4   11.1    Hematocrit 36.0 - 46.0 % 33.8   35.8   35.2    Platelets 150 - 400 K/uL 217   233.0   247.0     Iron/TIBC/Ferritin/ %Sat    Component Value Date/Time   IRON 37 06/29/2015 1234   IRON 42 (L)  08/17/2011 1129   TIBC 330 06/29/2015 1234   TIBC 292 08/17/2011 1129   FERRITIN 42 06/29/2015 1234   FERRITIN 68 08/17/2011 1129   IRONPCTSAT 11 06/29/2015 1234   IRONPCTSAT 14 08/17/2011 1129     Lab Results  Component Value Date/Time   VD25OH 40.11 05/30/2021 10:46 AM   VD25OH 29.48 (L) 03/30/2020 10:36 AM    Clinical ASCVD: Yes  The ASCVD Risk score (Arnett DK, et al., 2019) failed to calculate for the following reasons:   The 2019 ASCVD risk score is only valid for ages 74 to 41       04/05/2021   12:39 PM 04/08/2020    1:35 PM 03/31/2020   10:37 AM  Depression screen PHQ 2/9  Decreased Interest 0 0 0  Down, Depressed, Hopeless 0 0 0  PHQ - 2 Score 0 0 0  Altered sleeping   0  Tired, decreased energy   0  Change in appetite   0  Feeling bad or failure about yourself    0  Trouble concentrating   0  Moving slowly or fidgety/restless   0  Suicidal thoughts   0  PHQ-9 Score   0  Difficult doing work/chores   Not difficult at all     Social History   Tobacco Use  Smoking Status Never  Smokeless Tobacco Never  Tobacco Comments   tobacco use- no    BP Readings from Last 3 Encounters:  05/31/21 (!) 187/70  05/30/21 (!) 166/80  01/05/21 136/68   Pulse Readings from Last 3 Encounters:  05/31/21 60  05/30/21 68  01/05/21 62   Wt Readings from Last 3 Encounters:  05/30/21 193 lb 6 oz (87.7 kg)  04/05/21 192 lb (87.1 kg)  01/05/21 192 lb 1 oz (87.1 kg)   BMI Readings from Last 3 Encounters:  05/30/21 35.66 kg/m  04/05/21 36.28 kg/m  01/05/21 36.29 kg/m    Assessment/Interventions: Review of patient past medical history, allergies, medications, health status, including review of consultants reports, laboratory and other test data, was performed as part of comprehensive evaluation and provision of chronic care management services.   SDOH:  (Social Determinants of Health) assessments and interventions performed: No - done Dec 2022  SDOH Screenings    Alcohol Screen: Low Risk    Last Alcohol Screening Score (AUDIT): 0  Depression (PHQ2-9): Low  Risk    PHQ-2 Score: 0  Financial Resource Strain: Low Risk    Difficulty of Paying Living Expenses: Not hard at all  Food Insecurity: No Food Insecurity   Worried About Charity fundraiser in the Last Year: Never true   Ran Out of Food in the Last Year: Never true  Housing: Low Risk    Last Housing Risk Score: 0  Physical Activity: Inactive   Days of Exercise per Week: 0 days   Minutes of Exercise per Session: 0 min  Social Connections: Moderately Isolated   Frequency of Communication with Friends and Family: More than three times a week   Frequency of Social Gatherings with Friends and Family: More than three times a week   Attends Religious Services: More than 4 times per year   Active Member of [employer] or Organizations: No   Attends Archivist Meetings: Never   Marital Status: Widowed  Stress: No Stress Concern Present   Feeling of Stress : Not at all  Tobacco Use: Low Risk    Smoking Tobacco Use: Never   Smokeless Tobacco Use: Never   Passive Exposure: Not on file  Transportation Needs: No Transportation Needs   Lack of Transportation (Medical): No   Lack of Transportation (Non-Medical): No    CCM Care Plan  Allergies  Allergen Reactions   Bee Venom Anaphylaxis   Peanut-Containing Drug Products Itching   Aleve [Naproxen Sodium] Other (See Comments)    Spits up blood   Doxycycline Other (See Comments)    Malaise, GI upset, "felt drunk" and very ill   Metformin And Related Other (See Comments)    Chills, dizziness   Penicillins Rash    Has patient had a PCN reaction causing immediate rash, facial/tongue/throat swelling, SOB or lightheadedness with hypotension: Yes Has patient had a PCN reaction causing severe rash involving mucus membranes or skin necrosis: No Has patient had a PCN reaction that required hospitalization No Has patient had a PCN reaction  occurring within the last 10 years: No If all of the above answers are "NO", then may proceed with Cephalosporin use.     Medications Reviewed Today     Reviewed by Charlton Haws, [facility] (Pharmacist) on 09/14/21 at 1351  Med List Status: <None>   Medication Order Taking? Sig Documenting Provider Last Dose Status Informant  0.9 %  sodium chloride infusion [id number]   Armbruster, Carlota Raspberry, MD  Active   amLODipine (NORVASC) 5 MG tablet [id number] Yes TAKE ONE TABLET BY MOUTH ONCE DAILY [name], MD Taking Active   atorvastatin (LIPITOR) 40 MG tablet [id number] Yes TAKE ONE TABLET BY MOUTH ONCE DAILY [name], MD Taking Active   bismuth-metronidazole-tetracycline [facility]) [phone] MG capsule [id number] Yes Take 3 capsules by mouth 4 (four) times daily -  before meals and at bedtime. [name], MD Taking Active   Blood Glucose Monitoring Suppl [facility] VERIO) w/Device Drucie Opitz [id number] Yes Use to check blood sugar up to 4 times a day as directed [name], MD Taking Active   Cholecalciferol (VITAMIN D) 2000 units CAPS [id number] Yes Take 2,000 Units by mouth daily. [provider] Taking Active Self           Med Note Nadara Eaton Feb 20, 2018 11:50 AM)    clopidogrel (PLAVIX) 75 MG tablet [id number] Yes TAKE ONE TABLET BY MOUTH EVERY MORNING [name], MD Taking Active   Colchicine (MITIGARE) 0.6 MG CAPS [id number] Yes Take 1 tablet  by mouth daily as needed (for gout). [name], MD Taking Active   glucose blood [facility] VERIO) test strip [id number] Yes USE TO check blood glucose AS DIRECTED four times daily [name], MD Taking Active   hydrALAZINE (APRESOLINE) 50 MG tablet [id number] Yes TAKE ONE TABLET BY MOUTH EVERY MORNING and TAKE ONE TABLET BY MOUTH EVERYDAY AT BEDTIME [name], MD Taking Active   hydrochlorothiazide (HYDRODIURIL) 12.5 MG tablet [id number] Yes TAKE ONE TABLET BY MOUTH EVERY MORNING  [name], MD Taking Active   Insulin Glargine [facility] KWIKPEN) 100 UNIT/ML [id number] Yes Inject 18 Units into the skin at bedtime. [provider] Taking Active Self  Insulin Pen Needle (B-D UF III MINI PEN NEEDLES) 31G X 5 MM MISC [id number] Yes Use once daily [name], MD Taking Active   Lancets (ONETOUCH DELICA PLUS FIEPPI95J) [place] [id number] Yes USE TO check blood glucose AS DIRECTED four times daily [name], MD Taking Active   losartan (COZAAR) 100 MG tablet [id number] Yes TAKE ONE TABLET BY MOUTH EVERY MORNING [name], MD Taking Active   metoprolol succinate (TOPROL-XL) 25 MG 24 hr tablet [id number] Yes TAKE ONE TABLET BY MOUTH ONCE DAILY [name], MD Taking Active   nitroGLYCERIN (NITROSTAT) 0.4 MG SL tablet [id number] Yes Place 0.4 mg under the tongue every 5 (five) minutes as needed for chest pain. [provider] Taking Active Self           Med Note [place] Crane, MISTY D   Tue May 31, 2021  4:57 AM)    nystatin cream (MYCOSTATIN) [id number] Yes Apply 1 application topically 2 (two) times daily.  Patient taking differently: Apply 1 application. topically daily as needed for dry skin (rash).   [name], MD Taking Active   potassium chloride (KLOR-CON) 10 MEQ tablet [id number] Yes TAKE ONE TABLET BY MOUTH ONCE WEEKLY ON MONDAY and TAKE ONE TABLET BY MOUTH ONCE WEEKLY ON WEDNESDAY and TAKE ONE TABLET BY MOUTH ONCE WEEKLY ON Roby Lofts, MD Taking Active   vitamin B-12 (CYANOCOBALAMIN) 1000 MCG tablet [id number] Yes Take 1 tablet (1,000 mcg total) by mouth daily. [name], MD Taking Active   Med List Note [name] 03/16/20 2703): [phone]  [id number]            Patient Active Problem List   Diagnosis Date Noted   Left sided abdominal pain 05/30/2021   Type 2 diabetes mellitus with moderate nonproliferative diabetic retinopathy without macular edema, left eye ([place]) 01/21/2021    Low serum vitamin B12 01/10/2021   Fatigue 01/05/2021   Chronic kidney disease, stage 3a ([place]) 03/18/2020   Hypertensive urgency 03/16/2020   Hip osteoarthritis 03/11/2019   Pain due to onychomycosis of toenails of both feet 02/10/2019   Type 2 diabetes mellitus with vascular disease ([place]) 02/10/2019   Polypharmacy 02/04/2018   Seizure ([place]) 09/11/2017   Aphasia    Borderline hypothyroidism 03/12/2017   Disturbance of skin sensation 09/21/2016   Hemiparesis of right dominant side ([place]) 09/02/2016   Intertrigo 08/16/2016   Vitamin D deficiency 08/08/2016   Gout 03/24/2016   Syncope 02/29/2016   Concussion 02/29/2016   Arthritis of left wrist 02/07/2016   History of CVA (cerebrovascular accident) 11/03/2015   Aortic atherosclerosis ([place])    Anemia 06/18/2015   Sensorineural hearing loss (SNHL) of both ears 03/29/2015   Pain of right middle finger 12/22/2014   Memory deficit 12/04/2014   Primary osteoarthritis of left knee 01/27/2014   Advanced care planning/counseling discussion  12/11/2013   Medicare annual wellness visit, subsequent 12/11/2013   Encounter for general adult medical examination with abnormal findings 12/11/2013   CAD (coronary artery disease)/coronary calcifications    Chest pain 07/17/2013   Benign paroxysmal positional vertigo 05/26/2013   Hyperlipidemia associated with type 2 diabetes mellitus ([place])    Severe obesity (BMI 35.0-39.9) with comorbidity ([place])    History of pulmonary embolism    Osteoarthritis    Type 2 diabetes mellitus with hyperglycemia ([place])    History of DVT (deep vein thrombosis) 12/21/2010   Resistant hypertension 05/13/2009    Immunization History  Administered Date(s) Administered   Fluad Quad(high Dose 65+) 03/30/2020   PFIZER Comirnaty([place])Covid-19 Tri-Sucrose Vaccine 06/22/2020, 07/13/2020   PPD Test 09/25/2014   Pneumococcal Conjugate-13 09/10/2013   Pneumococcal Polysaccharide-23 03/10/2015   Zoster Recombinat (Shingrix)  07/15/2021   Zoster, Live 04/21/2014    Conditions to be addressed/monitored:  Hypertension, Hyperlipidemia, Diabetes, and Coronary Artery Disease  Care Plan : [place]  Updates made by Charlton Haws, [place] since 09/14/2021 12:00 AM     Problem: Hypertension, Hyperlipidemia, Diabetes, and Coronary Artery Disease   Priority: High     Long-Range Goal: Disease Management   Start Date: 06/25/2020  Expected End Date: 09/15/2022  This Visit's Progress: On track  Recent Progress: On track  Priority: High  Note:   Current Barriers:  Unable to maintain control of DM  Pharmacist Clinical Goal(s):  Patient will adhere to plan to optimize therapeutic regimen for DM as evidenced by report of adherence to recommended medication management changes through collaboration with PharmD and provider.   Interventions: 1:1 collaboration with [name], MD regarding development and update of comprehensive plan of care as evidenced by provider attestation and co-signature Inter-disciplinary care team collaboration (see longitudinal plan of care) Comprehensive medication review performed; medication list updated in electronic medical record  Hypertension (BP goal <140/90) -Query controlled - last clinic reading 166/80, pt is not checking regularly at home; Her granddaughter checks her BP occasionally at home and pt reports it was OK, she does not keep a log -Current treatment: Hydralazine 50 mg BID -Appropriate, Query Effective Amlodipine 5 mg daily -Appropriate, Query Effective Metoprolol Succinate 25 mg - 1/2 tablet daily -Appropriate, Query Effective Losartan 100 mg daily -Appropriate, Query Effective HCTZ 12.5 mg daily -Appropriate, Query Effective Potassium Chloride 10 mEq MWF -Medications previously tried: none   -Diet: She limits pork and salty foods.  -Counseled to monitor BP at home 2-3 days per week; alert PCP if readings are consistently > 150/90 -Recommended to  continue current medication  Hyperlipidemia: (LDL goal < 70) -Controlled - LDL 71 (05/2021) appropriate -Significant ASCVD hx - history of CVA, MI  -Current treatment: Atorvastatin 40 mg daily - Appropriate, Effective, Safe, Accessible Clopidogrel 75 mg daily - Appropriate, Effective, Safe, Accessible -Medications previously tried: none -Recommended to continue current medication  Diabetes (A1c goal 7.5-8%) -Query controlled - A1c 9.2% (05/2021) above goal, but home BG are improved; pt reports feeling very good, she recently tried to cut back on breads and BG is rarely over 200 (checking twice daily) -Current home glucose readings: fasting glucose: 116, 113 bedtime: <200 Denies hypoglycemic/hyperglycemic symptoms -Current medications: Basaglar 18 units daily - Appropriate, Query Effective Testing supplies -Medications previously tried: metformin (GI), Januvia, Jardiance (cost) -Eye and foot exam are up to date. -Reviewed CGM benefits, pt declined, she is ok with finger sticks for now -Recommended to continue current medication; PCP f/u 3 months for  repeat A1c  Patient Goals/Self-Care Activities Patient will:  - take medications as prescribed as evidenced by patient report and record review focus on medication adherence by pill packs check glucose twice daily, document, and provide at future appointments check blood pressure periodically, document, and provide at future appointments       Medication Assistance:  Basaglar [employer]) approved through 04/23/22  Compliance/Adherence/Medication fill history: Care Gaps: None  Star-Rating Drugs: Atorvastatin - PDC 99% Losartan - PDC 100%  Medication Access: Within the past 30 days, how often has patient missed a dose of medication? 0 Is a pillbox or other method used to improve adherence? Yes  Factors that may affect medication adherence? no barriers identified Are meds synced by current pharmacy? Yes  Are meds delivered  by current pharmacy? Yes  Does patient experience delays in picking up medications due to transportation concerns? No   [facility] Reviewed:  Is patient disadvantaged to use [facility]?: No  Current Rx insurance plan: [facility] [place] Name and location of Current pharmacy:  [facility] - [place], [place] - [address] [place] [address] Phone: [phone] Fax: [phone]  90 day pill packs (09/07/21) Atorvastatin 40 mg 1 tablet daily (breakfast) Metoprolol Succinate 25 mg take 1/2  tablet daily (breakfast)  Hydralazine 50 mg 1 tablet twice daily (1-breakfast, 1-bedtime) Clopidogrel 75 mg 1 tablet daily (breakfast) Potassium chloride ER 10 Meq 1 tablet daily on Mon, Wed, Friday (breakfast) Amlodipine 5 mg 1 tablet daily (breakfast) Losartan 100 mg 1 tablet daily (breakfast Hydrochlorothiazide 12.5 mg 1 tablet daily (breakfast)   Care Plan and Follow Up Patient Decision:  Patient agrees to Care Plan and Follow-up.  Plan: Telephone follow up appointment with care management team member scheduled for:  4 months  [name], PharmD, BCACP Clinical Pharmacist [facility] at [facility] [phone]

## 2021-09-14 NOTE — Patient Instructions (Signed)
Visit Information  Phone number for Pharmacist: [phone]   Goals Addressed   None     Care Plan : [place]  Updates made by Charlton Haws, [place] since 09/14/2021 12:00 AM     Problem: Hypertension, Hyperlipidemia, Diabetes, and Coronary Artery Disease   Priority: High     Long-Range Goal: Disease Management   Start Date: 06/25/2020  Expected End Date: 09/15/2022  This Visit's Progress: On track  Recent Progress: On track  Priority: High  Note:   Current Barriers:  Unable to maintain control of DM  Pharmacist Clinical Goal(s):  Patient will adhere to plan to optimize therapeutic regimen for DM as evidenced by report of adherence to recommended medication management changes through collaboration with PharmD and provider.   Interventions: 1:1 collaboration with [name], MD regarding development and update of comprehensive plan of care as evidenced by provider attestation and co-signature Inter-disciplinary care team collaboration (see longitudinal plan of care) Comprehensive medication review performed; medication list updated in electronic medical record  Hypertension (BP goal <140/90) -Query controlled - last clinic reading 166/80, pt is not checking regularly at home; Her granddaughter checks her BP occasionally at home and pt reports it was OK, she does not keep a log -Current treatment: Hydralazine 50 mg BID -Appropriate, Query Effective Amlodipine 5 mg daily -Appropriate, Query Effective Metoprolol Succinate 25 mg - 1/2 tablet daily -Appropriate, Query Effective Losartan 100 mg daily -Appropriate, Query Effective HCTZ 12.5 mg daily -Appropriate, Query Effective Potassium Chloride 10 mEq MWF -Medications previously tried: none   -Diet: She limits pork and salty foods.  -Counseled to monitor BP at home 2-3 days per week; alert PCP if readings are consistently > 150/90 -Recommended to continue current medication  Hyperlipidemia: (LDL goal  < 70) -Controlled - LDL 71 (05/2021) appropriate -Significant ASCVD hx - history of CVA, MI  -Current treatment: Atorvastatin 40 mg daily - Appropriate, Effective, Safe, Accessible Clopidogrel 75 mg daily - Appropriate, Effective, Safe, Accessible -Medications previously tried: none -Recommended to continue current medication  Diabetes (A1c goal 7.5-8%) -Query controlled - A1c 9.2% (05/2021) above goal, but home BG are improved; pt reports feeling very good, she recently tried to cut back on breads and BG is rarely over 200 (checking twice daily) -Current home glucose readings: fasting glucose: 116, 113 bedtime: <200 Denies hypoglycemic/hyperglycemic symptoms -Current medications: Basaglar 18 units daily - Appropriate, Query Effective Testing supplies -Medications previously tried: metformin (GI), Januvia, Jardiance (cost) -Eye and foot exam are up to date. -Reviewed CGM benefits, pt declined, she is ok with finger sticks for now -Recommended to continue current medication; PCP f/u 3 months for repeat A1c  Patient Goals/Self-Care Activities Patient will:  - take medications as prescribed as evidenced by patient report and record review focus on medication adherence by pill packs check glucose twice daily, document, and provide at future appointments check blood pressure periodically, document, and provide at future appointments       The patient verbalized understanding of instructions, educational materials, and care plan provided today and DECLINED offer to receive copy of patient instructions, educational materials, and care plan.  Telephone follow up appointment with pharmacy team member scheduled for: 4 months  [name], PharmD, [facility] Clinical Pharmacist [facility] at [facility] [phone]

## 2021-09-21 DIAGNOSIS — Z794 Long term (current) use of insulin: Secondary | ICD-10-CM | POA: Diagnosis not present

## 2021-09-21 DIAGNOSIS — E1169 Type 2 diabetes mellitus with other specified complication: Secondary | ICD-10-CM

## 2021-09-21 DIAGNOSIS — E785 Hyperlipidemia, unspecified: Secondary | ICD-10-CM | POA: Diagnosis not present

## 2021-09-21 DIAGNOSIS — I10 Essential (primary) hypertension: Secondary | ICD-10-CM | POA: Diagnosis not present

## 2021-10-10 ENCOUNTER — Other Ambulatory Visit: Payer: Self-pay | Admitting: *Deleted

## 2021-10-10 NOTE — Patient Outreach (Signed)
[place] [facility]) Care Management [place] Note   10/10/2021 Name:  Kristen [name] MRN:  [id number] DOB:  Nov 28, 1940  Summary:  Patient she is feeling much better. She has not checked her FBS this am. Patient took last night and it was 163. She is much more active. She is socializing more. She is awaiting her next A1C next month. Plan: RN discussed monitoring her blood sugar as ordered. Medication adherence.   Recommendations/Changes made from today's visit: Medication adherence Keep appointment for A1C in April Adhere to diet   Subjective: Kristen [name] is an 81 y.o. year old female who is a primary patient of [name], MD. The care management team was consulted for assistance with care management and/or care coordination needs.    RN Health Coach completed Telephone Visit today.   Objective:  Medications Reviewed Today     Reviewed by Verlin Grills, RN (Case Manager) on 10/10/21 at 72  Med List Status: <None>   Medication Order Taking? Sig Documenting Provider Last Dose Status Informant  0.9 %  sodium chloride infusion [id number]   Armbruster, Carlota Raspberry, MD  Active   amLODipine (NORVASC) 5 MG tablet [id number] No TAKE ONE TABLET BY MOUTH ONCE DAILY [name], MD Taking Active   atorvastatin (LIPITOR) 40 MG tablet [id number] No TAKE ONE TABLET BY MOUTH ONCE DAILY [name], MD Taking Active   bismuth-metronidazole-tetracycline [facility]) [phone] MG capsule [id number] No Take 3 capsules by mouth 4 (four) times daily -  before meals and at bedtime. [name], MD Taking Active   Blood Glucose Monitoring Suppl [facility] VERIO) w/Device Drucie Opitz [id number] No Use to check blood sugar up to 4 times a day as directed [name], MD Taking Active   Cholecalciferol (VITAMIN D) 2000 units CAPS [id number] No Take 2,000 Units by mouth daily. [provider] Taking Active Self           Med Note Nadara Eaton Feb 20, 2018 11:50 AM)    clopidogrel (PLAVIX) 75 MG tablet [id number] No TAKE ONE TABLET BY MOUTH EVERY MORNING [name], MD Taking Active   Colchicine (MITIGARE) 0.6 MG CAPS [id number] No Take 1 tablet by mouth daily as needed (for gout). [name], MD Taking Active   glucose blood [facility] VERIO) test strip [id number] No USE TO check blood glucose AS DIRECTED four times daily [name], MD Taking Active   hydrALAZINE (APRESOLINE) 50 MG tablet [id number] No TAKE ONE TABLET BY MOUTH EVERY MORNING and TAKE ONE TABLET BY MOUTH EVERYDAY AT BEDTIME [name], MD Taking Active   hydrochlorothiazide (HYDRODIURIL) 12.5 MG tablet [id number] No TAKE ONE TABLET BY MOUTH EVERY MORNING [name], MD Taking Active   Insulin Glargine [facility] KWIKPEN) 100 UNIT/ML [id number] No Inject 18 Units into the skin at bedtime. [provider] Taking Active Self  Insulin Pen Needle (B-D UF III MINI PEN NEEDLES) 31G X 5 MM MISC [id number] No Use once daily [name], MD Taking Active   Lancets (ONETOUCH DELICA PLUS [id number]) [place] [id number] No USE TO check blood glucose AS DIRECTED four times daily [name], MD Taking Active   losartan (COZAAR) 100 MG tablet [id number] No TAKE ONE TABLET BY MOUTH EVERY MORNING [name], MD Taking Active   metoprolol succinate (TOPROL-XL) 25 MG 24 hr tablet [id number] No TAKE ONE TABLET BY MOUTH ONCE DAILY [name], MD Taking Active   nitroGLYCERIN (NITROSTAT) 0.4 MG SL tablet [id number] No Place 0.4 mg  under the tongue every 5 (five) minutes as needed for chest pain. [provider] Taking Active Self           Med Note [place] Crane, MISTY D   Tue May 31, 2021  4:57 AM)    nystatin cream (MYCOSTATIN) [id number] No Apply 1 application topically 2 (two) times daily.  Patient taking differently: Apply 1 application. topically daily as needed for dry skin (rash).   [name], MD Taking Active   potassium chloride  (KLOR-CON) 10 MEQ tablet [id number] No TAKE ONE TABLET BY MOUTH ONCE WEEKLY ON MONDAY and TAKE ONE TABLET BY MOUTH ONCE WEEKLY ON WEDNESDAY and TAKE ONE TABLET BY MOUTH ONCE WEEKLY ON Roby Lofts, MD Taking Active   vitamin B-12 (CYANOCOBALAMIN) 1000 MCG tablet [id number] No Take 1 tablet (1,000 mcg total) by mouth daily. [name], MD Taking Active   Med List Note [name] 03/16/20 2058): [phone]  [phone]             SDOH:  (Social Determinants of Health) assessments and interventions performed:    Care Plan  Review of patient past medical history, allergies, medications, health status, including review of consultants reports, laboratory and other test data, was performed as part of comprehensive evaluation for care management services.   Care Plan : RN Care Manager Plan of Care  Updates made by Adorian Gwynne, Eppie Gibson, RN since 10/10/2021 12:00 AM     Problem: Knowledge Deficit Related to Diabetes and Care Coordination Needs   Priority: High     Long-Range Goal: Development Plan of Care for Management of Diabetes   Start Date: 05/09/2021  Expected End Date: 04/22/2022  Priority: High  Note:   Current Barriers:  Knowledge Deficits related to plan of care for management of DMII   RNCM Clinical Goal(s):  Patient will verbalize understanding of plan for management of DMII as evidenced by continuation of monitoring blood sugars and adhering to diabetic diet  through collaboration with RN Care manager, provider, and care team.   Inter-disciplinary care team collaboration (see longitudinal plan of care) Evaluation of current treatment plan related to  self management and patient's adherence to plan as established by provider  Patient Goals/Self-Care Activities: Take medications as prescribed   Attend all scheduled provider appointments Call pharmacy for medication refills 3-7 days in advance of running out of medications Attend  church or other social activities Perform all self care activities independently  Perform IADL's (shopping, preparing meals, housekeeping, managing finances) independently Call provider office for new concerns or questions  call the Suicide and Crisis Lifeline: 988 if experiencing a Mental Health or [place]  check blood sugar at prescribed times: once daily check feet daily for cuts, sores or redness take the blood sugar meter to all doctor visits trim toenails straight across drink 6 to 8 glasses of water each day manage portion size switch to sugar-free drinks wash and dry feet carefully every day Start an exercise routine  [id number] Patient A1C has elevated to 9.2 from 8.0. Per patient she is just getting over shingles. Patient had just gotten up and had not checked her blood sugars or eaten. Patient does not have an exercise routine. RN discussed the importance of starting an exercise routine.   [id number]/ Per patient she is feeling much better. She has not checked her FBS this am. Patient took last night and it was 163. She is much more active. She is socializing more. She is awaiting her next  A1C next month. Plan: RN discussed monitoring her blood sugar as ordered. Medication adherence.       Plan: Telephone follow up appointment with care management team member scheduled for:  December 07, 2021 The patient has been provided with contact information for the care management team and has been advised to call with any health related questions or concerns.   [place] Care Management [phone]

## 2021-10-10 NOTE — Patient Instructions (Signed)
Visit Information  Thank you for taking time to visit with me today. Please don't hesitate to contact me if I can be of assistance to you before our next scheduled telephone appointment.  Following are the goals we discussed today:  (Current Barriers:  Knowledge Deficits related to plan of care for management of DMII   RNCM Clinical Goal(s):  Patient will verbalize understanding of plan for management of DMII as evidenced by continuation of monitoring blood sugars and adhering to diabetic diet through collaboration with RN Care manager, provider, and care team.   Inter-disciplinary care team collaboration (see longitudinal plan of care) Evaluation of current treatment plan related to  self management and patient's adherence to plan as established by provider  Patient Goals/Self-Care Activities: Take medications as prescribed   Attend all scheduled provider appointments Call pharmacy for medication refills 3-7 days in advance of running out of medications Attend church or other social activities Perform all self care activities independently  Perform IADL's (shopping, preparing meals, housekeeping, managing finances) independently Call provider office for new concerns or questions  call the Suicide and Crisis Lifeline: 988 if experiencing a Mental Health or [place]  check blood sugar at prescribed times: once daily check feet daily for cuts, sores or redness take the blood sugar meter to all doctor visits trim toenails straight across drink 6 to 8 glasses of water each day manage portion size switch to sugar-free drinks wash and dry feet carefully every day Start an exercise routine  [id number] Patient A1C has elevated to 9.2 from 8.0. Per patient she is just getting over shingles. Patient had just gotten up and had not checked her blood sugars or eaten. Patient does not have an exercise routine. RN discussed the importance of starting an exercise routine.   [id number]/  Per patient she is feeling much better. She has not checked her FBS this am. Patient took last night and it was 163. She is much more active. She is socializing more. She is awaiting her next A1C next month. Plan: RN discussed monitoring her blood sugar as ordered. Medication adherence.   Our next appointment is by telephone on December 07, 2021  Please call Johny Shock RN [phone] if you need to cancel or reschedule your appointment.   Please call the Suicide and Crisis Lifeline: 988 if you are experiencing a Mental Health or [place] or need someone to talk to.  The patient verbalized understanding of instructions, educational materials, and care plan provided today and agreed to receive a mailed copy of patient instructions, educational materials, and care plan.   Telephone follow up appointment with care management team member scheduled for: The patient has been provided with contact information for the care management team and has been advised to call with any health related questions or concerns.   [place] Care Management [phone]

## 2021-10-24 ENCOUNTER — Telehealth: Payer: Self-pay

## 2021-10-24 NOTE — Chronic Care Management (AMB) (Signed)
Chronic Care Management Pharmacy Assistant   Name: Kristen [name]  MRN: [id number] DOB: 1940-05-28   Reason for Encounter:  Diabetes Disease State  Recent office visits:  None since last CCM contact  Recent consult visits:  None since last CCM contact  Hospital visits:  None in previous 6 months  Medications: Outpatient Encounter Medications as of 10/24/2021  Medication Sig   amLODipine (NORVASC) 5 MG tablet TAKE ONE TABLET BY MOUTH ONCE DAILY   atorvastatin (LIPITOR) 40 MG tablet TAKE ONE TABLET BY MOUTH ONCE DAILY   bismuth-metronidazole-tetracycline (PYLERA) 140-125-125 MG capsule Take 3 capsules by mouth 4 (four) times daily -  before meals and at bedtime.   Blood Glucose Monitoring Suppl (ONETOUCH VERIO) w/Device KIT Use to check blood sugar up to 4 times a day as directed   Cholecalciferol (VITAMIN D) 2000 units CAPS Take 2,000 Units by mouth daily.   clopidogrel (PLAVIX) 75 MG tablet TAKE ONE TABLET BY MOUTH EVERY MORNING   Colchicine (MITIGARE) 0.6 MG CAPS Take 1 tablet by mouth daily as needed (for gout).   glucose blood (ONETOUCH VERIO) test strip USE TO check blood glucose AS DIRECTED four times daily   hydrALAZINE (APRESOLINE) 50 MG tablet TAKE ONE TABLET BY MOUTH EVERY MORNING and TAKE ONE TABLET BY MOUTH EVERYDAY AT BEDTIME   hydrochlorothiazide (HYDRODIURIL) 12.5 MG tablet TAKE ONE TABLET BY MOUTH EVERY MORNING   Insulin Glargine (BASAGLAR KWIKPEN) 100 UNIT/ML Inject 18 Units into the skin at bedtime.   Insulin Pen Needle (B-D UF III MINI PEN NEEDLES) 31G X 5 MM MISC Use once daily   Lancets (ONETOUCH DELICA PLUS [id number]) MISC USE TO check blood glucose AS DIRECTED four times daily   losartan (COZAAR) 100 MG tablet TAKE ONE TABLET BY MOUTH EVERY MORNING   metoprolol succinate (TOPROL-XL) 25 MG 24 hr tablet TAKE ONE TABLET BY MOUTH ONCE DAILY   nitroGLYCERIN (NITROSTAT) 0.4 MG SL tablet Place 0.4 mg under the tongue every 5 (five) minutes as needed for chest  pain.   nystatin cream (MYCOSTATIN) Apply 1 application topically 2 (two) times daily. (Patient taking differently: Apply 1 application. topically daily as needed for dry skin (rash).)   potassium chloride (KLOR-CON) 10 MEQ tablet TAKE ONE TABLET BY MOUTH ONCE WEEKLY ON MONDAY and TAKE ONE TABLET BY MOUTH ONCE WEEKLY ON WEDNESDAY and TAKE ONE TABLET BY MOUTH ONCE WEEKLY ON FRIDAY   vitamin B-12 (CYANOCOBALAMIN) 1000 MCG tablet Take 1 tablet (1,000 mcg total) by mouth daily.   Facility-Administered Encounter Medications as of 10/24/2021  Medication   0.9 %  sodium chloride infusion     Recent Relevant Labs: Lab Results  Component Value Date/Time   HGBA1C 9.2 (H) 05/30/2021 10:46 AM   HGBA1C 8.0 (A) 01/05/2021 11:15 AM   HGBA1C 10.7 (H) 03/16/2020 05:37 AM   MICROALBUR 11.5 (H) 05/30/2021 10:46 AM   MICROALBUR 7.8 (H) 08/09/2016 10:28 AM    Kidney Function Lab Results  Component Value Date/Time   CREATININE 1.06 (H) 05/30/2021 08:15 PM   CREATININE 0.91 05/30/2021 10:46 AM   CREATININE 1.34 (H) 03/10/2014 12:33 PM   CREATININE 0.97 07/02/2013 12:32 AM   GFR 59.88 (L) 05/30/2021 10:46 AM   GFRNONAA 53 (L) 05/30/2021 08:15 PM   GFRNONAA 41 (L) 03/10/2014 12:33 PM   GFRNONAA 59 (L) 07/02/2013 12:32 AM   GFRAA >60 02/20/2018 10:26 AM   GFRAA 50 (L) 03/10/2014 12:33 PM   GFRAA >60 07/02/2013 12:32 AM  Contacted patient on 11/10/21 to discuss diabetes disease state.   Current antihyperglycemic regimen:  Basaglar 18 units daily Testing supplies    Patient verbally confirms she is taking the above medications as directed. Yes  What diet changes have been made to improve diabetes control? The patient chooses to cook meals at home. Fresh vegetables, low sugar, zero sugar drinks.  What recent interventions/DTPs have been made to improve glycemic control:    Patient off Jardiance due to GI upset,   Have there been any recent hospitalizations or ED visits since last visit with CPP?  No  Patient denies hypoglycemic symptoms, including Pale, Sweaty, Shaky, Hungry, Nervous/irritable, and Vision changes  Patient denies hyperglycemic symptoms, including blurry vision, excessive thirst, fatigue, polyuria, and weakness  How often are you checking your blood sugar? Ever so often, not daily   What are your blood sugars ranging?  Morning and night time  Fasting: 119,109,117 Bedtime: 194,174,081  During the week, how often does your blood glucose drop below 70? Never  Are you checking your feet daily/regularly? Yes  Adherence Review: Is the patient currently on a STATIN medication? Yes Is the patient currently on ACE/ARB medication? No Does the patient have >5 day gap between last estimated fill dates? No  Care Gaps: Annual wellness visit in last year? Yes Most recent A1C reading: 9.2        05/30/21 Most Recent BP reading:166/80   05/30/21  Last eye exam / retinopathy screening:UTD Last diabetic foot exam:UTD  Counseled patient on importance of annual eye and foot exam. UTD  Star Rating Drugs:  Medication:  Last Fill: Day Supply Atorvastatin 19m 08/31/21 90 BOakes PCP appointment on 11/28/21 and CCM appointment on 01/13/22  LCharlene Brooke CPP notified  [employer] [place] [phone]

## 2021-11-08 ENCOUNTER — Ambulatory Visit: Payer: Self-pay | Admitting: *Deleted

## 2021-11-08 NOTE — Patient Outreach (Signed)
[facility]) Care Management  11/08/2021  Kristen [name] June 29, 1940 [id number]   [id number]  Patient had not checked her blood sugar today. Per patient it has been running around 100-120. Her A1C is 9.2. Awaiting next A1C draw. RN Health Coach will close case. Care Coordinator with follow up with care. Plan Care Coordinator with follow up with care.  [place] Care Management [phone]

## 2021-11-14 ENCOUNTER — Telehealth: Payer: Medicare Other

## 2021-11-25 ENCOUNTER — Telehealth: Payer: Self-pay

## 2021-11-25 NOTE — Chronic Care Management (AMB) (Signed)
Chronic Care Management Pharmacy Assistant   Name: Kristen [name]  MRN: [id number] DOB: 1941-01-08   Reason for Encounter: Medication Adherence and Delivery Coordination   Recent office visits:  None since last CCM contact  Recent consult visits:  None since last CCM contact  Hospital visits:  None in previous 6 months  Medications: Outpatient Encounter Medications as of 11/25/2021  Medication Sig   amLODipine (NORVASC) 5 MG tablet TAKE ONE TABLET BY MOUTH ONCE DAILY   atorvastatin (LIPITOR) 40 MG tablet TAKE ONE TABLET BY MOUTH ONCE DAILY   bismuth-metronidazole-tetracycline (PYLERA) 140-125-125 MG capsule Take 3 capsules by mouth 4 (four) times daily -  before meals and at bedtime.   Blood Glucose Monitoring Suppl (ONETOUCH VERIO) w/Device KIT Use to check blood sugar up to 4 times a day as directed   Cholecalciferol (VITAMIN D) 2000 units CAPS Take 2,000 Units by mouth daily.   clopidogrel (PLAVIX) 75 MG tablet TAKE ONE TABLET BY MOUTH EVERY MORNING   Colchicine (MITIGARE) 0.6 MG CAPS Take 1 tablet by mouth daily as needed (for gout).   glucose blood (ONETOUCH VERIO) test strip USE TO check blood glucose AS DIRECTED four times daily   hydrALAZINE (APRESOLINE) 50 MG tablet TAKE ONE TABLET BY MOUTH EVERY MORNING and TAKE ONE TABLET BY MOUTH EVERYDAY AT BEDTIME   hydrochlorothiazide (HYDRODIURIL) 12.5 MG tablet TAKE ONE TABLET BY MOUTH EVERY MORNING   Insulin Glargine (BASAGLAR KWIKPEN) 100 UNIT/ML Inject 18 Units into the skin at bedtime.   Insulin Pen Needle (B-D UF III MINI PEN NEEDLES) 31G X 5 MM MISC Use once daily   Lancets (ONETOUCH DELICA PLUS [id number]) MISC USE TO check blood glucose AS DIRECTED four times daily   losartan (COZAAR) 100 MG tablet TAKE ONE TABLET BY MOUTH EVERY MORNING   metoprolol succinate (TOPROL-XL) 25 MG 24 hr tablet TAKE ONE TABLET BY MOUTH ONCE DAILY   nitroGLYCERIN (NITROSTAT) 0.4 MG SL tablet Place 0.4 mg under the tongue every 5 (five) minutes  as needed for chest pain.   nystatin cream (MYCOSTATIN) Apply 1 application topically 2 (two) times daily. (Patient taking differently: Apply 1 application. topically daily as needed for dry skin (rash).)   potassium chloride (KLOR-CON) 10 MEQ tablet TAKE ONE TABLET BY MOUTH ONCE WEEKLY ON MONDAY and TAKE ONE TABLET BY MOUTH ONCE WEEKLY ON WEDNESDAY and TAKE ONE TABLET BY MOUTH ONCE WEEKLY ON FRIDAY   vitamin B-12 (CYANOCOBALAMIN) 1000 MCG tablet Take 1 tablet (1,000 mcg total) by mouth daily.   Facility-Administered Encounter Medications as of 11/25/2021  Medication   0.9 %  sodium chloride infusion    BP Readings from Last 3 Encounters:  05/31/21 (!) 187/70  05/30/21 (!) 166/80  01/05/21 136/68    Lab Results  Component Value Date   HGBA1C 9.2 (H) 05/30/2021      No OVs, Consults, or hospital visits since last care coordination call / Pharmacist visit. No medication changes indicated   Last adherence delivery date:09/07/21      Patient is due for next adherence delivery on: 12/06/21 ( 12/07/21 patient request)  Spoke with patient on 11/25/21 reviewed medications and coordinated delivery.  This delivery to include: Adherence Packaging  90 Days  Packs: Atorvastatin 40 mg 1 tablet daily (breakfast) Metoprolol Succinate 25 mg take 1/2  tablet daily (breakfast)  Hydralazine 50 mg 1 tablet twice daily (1-breakfast, 1-bedtime) Clopidogrel 75 mg 1 tablet daily (breakfast) Potassium chloride ER 10 Meq 1 tablet daily on Mon, Wed,  Friday (breakfast) Amlodipine 5 mg 1 tablet daily (breakfast) Losartan 100 mg 1 tablet daily (breakfast Hydrochlorothiazide 12.5 mg 1 tablet daily (breakfast)  VIAL medications: Pen needles use as directed once daily at bedtime One touch delica plus lancets 92E   Patient declined the following medications this month: Engineer, agricultural - manufacturer One touch verio test strips-patient has supply on hand  Colchicine 0.72m- patient has supply on hand for prn  use  Any concerns about your medications? No- patient requests delivery pushed out 1 day due to she will be out of town 8/15 returning 12/07/21.  How often do you forget or accidentally miss a dose? Never  Do you use a pillbox? No  Is patient in packaging Yes  What is the date on your next pill pack? Unable to locate eye glasses  Any concerns or issues with your packaging? Satisfied with process   Refills requested from providers include: Pen needles, lancets   Confirmed delivery date of 12/07/21, advised patient that pharmacy will contact them the morning of delivery.  Recent blood pressure readings are as follows: monitor not working correctly, we discussed some high office readings.Patient states she has white coat syndrome and Dr.G always takes a second BP before she leaves the office    Recent blood glucose readings are as follows: Fasting: 101,131,109,129  After Meals:  [phone] Annual wellness visit in last year? Yes Most Recent BP reading:166/80    05/30/21  AWV  If Diabetic: Most recent A1C reading: 9.2  patient states this was during shingles flare up and she was in pain and taking lots of medications for the pain  Last eye exam / retinopathy screening:UTD Last diabetic foot exam:UTD  Cycle dispensing form sent to LMargaretmary Dys PTM for review.  LCharlene Brooke CPP notified  [place]vel [place] [place] [phone]

## 2021-11-28 ENCOUNTER — Ambulatory Visit: Payer: Medicare Other | Admitting: Family Medicine

## 2021-12-05 ENCOUNTER — Encounter: Payer: Self-pay | Admitting: Family Medicine

## 2021-12-05 ENCOUNTER — Other Ambulatory Visit: Payer: Self-pay | Admitting: Family Medicine

## 2021-12-05 ENCOUNTER — Ambulatory Visit (INDEPENDENT_AMBULATORY_CARE_PROVIDER_SITE_OTHER): Payer: Medicare Other | Admitting: Family Medicine

## 2021-12-05 VITALS — BP 134/80 | HR 64 | Temp 97.6°F | Ht 61.75 in | Wt 189.5 lb

## 2021-12-05 DIAGNOSIS — E1165 Type 2 diabetes mellitus with hyperglycemia: Secondary | ICD-10-CM

## 2021-12-05 DIAGNOSIS — Z794 Long term (current) use of insulin: Secondary | ICD-10-CM | POA: Diagnosis not present

## 2021-12-05 DIAGNOSIS — E1159 Type 2 diabetes mellitus with other circulatory complications: Secondary | ICD-10-CM

## 2021-12-05 DIAGNOSIS — I10 Essential (primary) hypertension: Secondary | ICD-10-CM

## 2021-12-05 DIAGNOSIS — E113392 Type 2 diabetes mellitus with moderate nonproliferative diabetic retinopathy without macular edema, left eye: Secondary | ICD-10-CM | POA: Diagnosis not present

## 2021-12-05 DIAGNOSIS — E118 Type 2 diabetes mellitus with unspecified complications: Secondary | ICD-10-CM

## 2021-12-05 LAB — POCT GLYCOSYLATED HEMOGLOBIN (HGB A1C): Hemoglobin A1C: 8.4 % — AB (ref 4.0–5.6)

## 2021-12-05 NOTE — Progress Notes (Signed)
Patient ID: Kristen [name], female    DOB: [birth year], 81 y.o.   MRN: [id number]  This visit was conducted in person.  BP 134/80   Pulse 64   Temp 97.6 F (36.4 C) (Temporal)   Ht 5' 1.75" (1.568 m)   Wt 189 lb 8 oz (86 kg)   SpO2 98%   BMI 34.94 kg/m    CC: 6 mo f/u visit  Subjective:   HPI: Kristen [name] is a 81 y.o. female presenting on 12/05/2021 for Diabetes (Here for 6 mo f/u.)   She has been feeling well this year. She had shingles a few months ago, but fully recovered from this. She's since completed shingrix vaccination series.   DM - does not regularly check sugars - last cbg was yesterday morning fasting 130s. Compliant with antihyperglycemic regimen which includes: basaglar 18u qhs Jardiance 30d trial without much improvement in sugars - also was not approved for PAP so she decided to stay off this medication. Denies low sugars or hypoglycemic symptoms. Denies paresthesias, blurry vision. Last diabetic eye exam 04/2021. Glucometer brand: one touch verio. Last foot exam: 12/2020 - update today. DSME: declined.  Lab Results  Component Value Date   HGBA1C 8.4 (A) 12/05/2021   Diabetic Foot Exam - Simple   Simple Foot Form Diabetic Foot exam was performed with the following findings: Yes 12/05/2021 10:54 AM  Visual Inspection No deformities, no ulcerations, no other skin breakdown bilaterally: Yes Sensation Testing Intact to touch and monofilament testing bilaterally: Yes Pulse Check Posterior Tibialis and Dorsalis pulse intact bilaterally: Yes Comments    Lab Results  Component Value Date   MICROALBUR 11.5 (H) 05/30/2021         Relevant past medical, surgical, family and social history reviewed and updated as indicated. Interim medical history since our last visit reviewed. Allergies and medications reviewed and updated. Outpatient Medications Prior to Visit  Medication Sig Dispense Refill   amLODipine (NORVASC) 5 MG tablet TAKE ONE TABLET BY MOUTH ONCE  DAILY 90 tablet 3   atorvastatin (LIPITOR) 40 MG tablet TAKE ONE TABLET BY MOUTH ONCE DAILY 90 tablet 3   bismuth-metronidazole-tetracycline (PYLERA) 140-125-125 MG capsule Take 3 capsules by mouth 4 (four) times daily -  before meals and at bedtime. 120 capsule 0   Blood Glucose Monitoring Suppl (ONETOUCH VERIO) w/Device KIT Use to check blood sugar up to 4 times a day as directed 1 kit 0   Cholecalciferol (VITAMIN D) 2000 units CAPS Take 2,000 Units by mouth daily.     clopidogrel (PLAVIX) 75 MG tablet TAKE ONE TABLET BY MOUTH EVERY MORNING 90 tablet 1   Colchicine (MITIGARE) 0.6 MG CAPS Take 1 tablet by mouth daily as needed (for gout). 30 capsule 3   glucose blood (ONETOUCH VERIO) test strip USE TO check blood glucose AS DIRECTED four times daily 100 strip 3   hydrALAZINE (APRESOLINE) 50 MG tablet TAKE ONE TABLET BY MOUTH EVERY MORNING and TAKE ONE TABLET BY MOUTH EVERYDAY AT BEDTIME 180 tablet 3   hydrochlorothiazide (HYDRODIURIL) 12.5 MG tablet TAKE ONE TABLET BY MOUTH EVERY MORNING 90 tablet 3   Insulin Glargine (BASAGLAR KWIKPEN) 100 UNIT/ML Inject 18 Units into the skin at bedtime.     Insulin Pen Needle (B-D UF III MINI PEN NEEDLES) 31G X 5 MM MISC Use once daily 100 each 3   Lancets (ONETOUCH DELICA PLUS [id number]) MISC USE TO check blood glucose AS DIRECTED four times daily 100 each 3  losartan (COZAAR) 100 MG tablet TAKE ONE TABLET BY MOUTH EVERY MORNING 90 tablet 3   metoprolol succinate (TOPROL-XL) 25 MG 24 hr tablet TAKE ONE TABLET BY MOUTH ONCE DAILY 90 tablet 3   nitroGLYCERIN (NITROSTAT) 0.4 MG SL tablet Place 0.4 mg under the tongue every 5 (five) minutes as needed for chest pain.     nystatin cream (MYCOSTATIN) Apply 1 application topically 2 (two) times daily. (Patient taking differently: Apply 1 application  topically daily as needed for dry skin (rash).) 30 g 0   potassium chloride (KLOR-CON) 10 MEQ tablet TAKE ONE TABLET BY MOUTH ONCE WEEKLY ON MONDAY and TAKE ONE TABLET BY  MOUTH ONCE WEEKLY ON WEDNESDAY and TAKE ONE TABLET BY MOUTH ONCE WEEKLY ON FRIDAY 40 tablet 3   vitamin B-12 (CYANOCOBALAMIN) 1000 MCG tablet Take 1 tablet (1,000 mcg total) by mouth daily.     Facility-Administered Medications Prior to Visit  Medication Dose Route Frequency Provider Last Rate Last Admin   0.9 %  sodium chloride infusion  500 mL Intravenous Once Armbruster, Carlota Raspberry, MD         Per HPI unless specifically indicated in ROS section below Review of Systems  Objective:  BP 134/80   Pulse 64   Temp 97.6 F (36.4 C) (Temporal)   Ht 5' 1.75" (1.568 m)   Wt 189 lb 8 oz (86 kg)   SpO2 98%   BMI 34.94 kg/m   Wt Readings from Last 3 Encounters:  12/05/21 189 lb 8 oz (86 kg)  05/30/21 193 lb 6 oz (87.7 kg)  04/05/21 192 lb (87.1 kg)      Physical Exam Vitals and nursing note reviewed.  Constitutional:      Appearance: Normal appearance. She is not ill-appearing.  Eyes:     Extraocular Movements: Extraocular movements intact.     Conjunctiva/sclera: Conjunctivae normal.     Pupils: Pupils are equal, round, and reactive to light.  Cardiovascular:     Rate and Rhythm: Normal rate and regular rhythm.     Pulses: Normal pulses.     Heart sounds: Normal heart sounds. No murmur heard. Pulmonary:     Effort: Pulmonary effort is normal. No respiratory distress.     Breath sounds: Normal breath sounds. No wheezing, rhonchi or rales.  Musculoskeletal:     Right lower leg: No edema.     Left lower leg: No edema.  Skin:    General: Skin is warm and dry.     Findings: No rash.  Neurological:     Mental Status: She is alert.  Psychiatric:        Mood and Affect: Mood normal.        Behavior: Behavior normal.       Results for orders placed or performed in visit on 12/05/21  POCT glycosylated hemoglobin (Hb A1C)  Result Value Ref Range   Hemoglobin A1C 8.4 (A) 4.0 - 5.6 %   HbA1c POC (<> result, manual entry)     HbA1c, POC (prediabetic range)     HbA1c, POC  (controlled diabetic range)      Assessment & Plan:   Problem List Items Addressed This Visit     Resistant hypertension    Chronic, great control on current regimen - continue this.       Type 2 diabetes mellitus with hyperglycemia (HCC) - Primary    A1c above goal, discussed with patient.  Declines DSME.  Declines med change at this time.  Continue  basaglar 18u daily.  Encouraged renewed efforts to follow diabetic diet. Reassess at CPE in 6 months.       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         No orders of the defined types were placed in this encounter.  Orders Placed This Encounter  Procedures   POCT glycosylated hemoglobin (Hb A1C)     Patient Instructions  You are doing well today!  A1c was a bit high - continue low sugar low carb diet. Goal A1c is <8%, ideally lower if able. If sugars staying high, we may discuss increasing basaglar or starting new diabetes medicine.  Let us know date of second shingles shot.  Return as needed or in 6 months for physical/wellness visit.   Follow up plan: Return in about 6 months (around 06/07/2022) for annual exam, prior fasting for blood work, medicare wellness visit.  [name], MD

## 2021-12-05 NOTE — Patient Instructions (Addendum)
You are doing well today!  A1c was a bit high - continue low sugar low carb diet. Goal A1c is <8%, ideally lower if able. If sugars staying high, we may discuss increasing basaglar or starting new diabetes medicine.  Let us know date of second shingles shot.  Return as needed or in 6 months for physical/wellness visit.

## 2021-12-05 NOTE — Assessment & Plan Note (Signed)
Chronic, great control on current regimen - continue this.

## 2021-12-05 NOTE — Assessment & Plan Note (Signed)
Sees eye doctor regularly - latest 04/2021.

## 2021-12-05 NOTE — Assessment & Plan Note (Addendum)
A1c above goal, discussed with patient.  Declines DSME.  Declines med change at this time.  Continue basaglar 18u daily.  Encouraged renewed efforts to follow diabetic diet. Reassess at CPE in 6 months.

## 2021-12-07 ENCOUNTER — Ambulatory Visit: Payer: Medicare Other | Admitting: *Deleted

## 2021-12-21 ENCOUNTER — Ambulatory Visit: Payer: Medicare Other | Admitting: *Deleted

## 2022-01-09 ENCOUNTER — Ambulatory Visit: Payer: Self-pay

## 2022-01-09 NOTE — Patient Outreach (Signed)
  Care Coordination   Follow Up Visit Note   01/09/2022 Name: Kristen [name] MRN: [id number] DOB: [birth year]  Kristen [name] is a 80 y.o. year old female who sees [name], MD for primary care. I spoke with  Kristen [name] by phone today.  What matters to the patients health and wellness today?  Ongoing management of her diabetes.  Patient denies checking blood sugar today. She states she checks her blood sugars 2-3 times per week and when she knows she has eaten something she shouldn't have.  Patient states her most recent blood sugars have ranged from 100- 225.  She denies any blood sugars below 70 or low blood sugar symptoms.   Patient confirms having follow up visit with her primary care provider on 12/05/21. She denies any medication/ treatment change. Patient states she continues to take her medications as prescribed. Per chart review next follow up visit with her primary care provider is 06/07/21.     Goals Addressed             This Visit's Progress    Patient stated:  " Managing my diabetes"       Care Coordination Interventions: Evaluation of current treatment plan related to Type 2 Diabetes and patient's adherence to plan as established by provider Encouraged patient to check blood sugars regularly and record Reviewed medications with patient and discussed importance of compliance Reviewed scheduled/upcoming provider appointments Discussed plans with patient for ongoing care management follow up and provided patient with direct contact information for care management team Diabetic education article sent to patient on Diabetes self care          SDOH assessments and interventions completed:  No     Care Coordination Interventions Activated:  Yes  Care Coordination Interventions:  Yes, provided   Follow up plan: Follow up call scheduled for 02/14/22 at 10 am    Encounter Outcome:  Pt. Visit Completed   Quinn Plowman RN,BSN,CCM [place] [phone] direct line

## 2022-01-09 NOTE — Patient Instructions (Signed)
Visit Information  Thank you for taking time to visit with me today. Please don't hesitate to contact me if I can be of assistance to you.   Following are the goals we discussed today:   Goals Addressed             This Visit's Progress    Patient stated:  " Managing my diabetes"       Care Coordination Interventions: Evaluation of current treatment plan related to Type 2 Diabetes and patient's adherence to plan as established by provider Encouraged patient to check blood sugars regularly and record Reviewed medications with patient and discussed importance of compliance Reviewed scheduled/upcoming provider appointments Discussed plans with patient for ongoing care management follow up and provided patient with direct contact information for care management team Diabetic education article sent to patient on Diabetes self care          Our next appointment is by telephone on 02/14/22 at 10 am  Please call the care guide team at [phone] if you need to cancel or reschedule your appointment.   If you are experiencing a Mental Health or [place] or need someone to talk to, please call 1-800-273-TALK (toll free, 24 hour hotline)  The patient verbalized understanding of instructions, educational materials, and care plan provided today and agreed to receive a mailed copy of patient instructions, educational materials, and care plan.   .Type 2 Diabetes Mellitus, Self-Care, Adult When you have type 2 diabetes (type 2 diabetes mellitus), you must make sure your blood sugar (glucose) stays in a healthy range. You can do this with: Nutrition. Exercise. Lifestyle changes. Medicines or insulin, if needed. Support from your doctors and others. What are the risks? Having type 2 diabetes can raise your risk for other long-term (chronic) health problems. You may get medicines to help prevent these problems. How to stay aware of your blood sugar  Check your blood sugar  level every day, as often as told. Have your A1C (hemoglobin A1C) level checked two or more times a year. Have it checked more often if told. Your doctor will set personal treatment goals for you. In general, you should have these blood sugar levels: Before meals: 80-130 mg/dL (4.4-7.2 mmol/L). After meals: below 180 mg/dL (10 mmol/L). A1C: less than 7%. How to manage high and low blood sugar Symptoms of high blood sugar High blood sugar is also called hyperglycemia. Know the symptoms of high blood sugar. These may include: More thirst. Hunger. Feeling very tired. Needing to pee (urinate) more often than normal. Seeing things blurry. Symptoms of low blood sugar Low blood sugar is also called hypoglycemia. This is when blood sugar is at or below 70 mg/dL (3.9 mmol/L). Symptoms may include: Hunger. Feeling worried or nervous (anxious). Feeling sweaty and cold to the touch (clammy). Being dizzy or light-headed. Feeling sleepy. A fast heartbeat. Feeling grouchy (irritable). Tingling or loss of feeling (numbness) around your mouth, lips, or tongue. Restless sleep. Diabetes medicines can cause low blood sugar. You are more at risk: While you exercise. After exercise. During sleep. When you are sick. When you skip meals or do not eat for a long time. Treating low blood sugar If you think you have low blood sugar, eat or drink something sugary right away. Keep 15 grams of a fast-acting carb (carbohydrate) with you all the time. Make sure your family and friends know how to treat you if you cannot treat yourself. Treating very low blood sugar Severe hypoglycemia is  when your blood sugar is at or below 54 mg/dL (3 mmol/L). Severe hypoglycemia is an emergency. Get medical help right away. Call your local emergency services (911 in the [place] Do not wait to see if the symptoms will go away. Do not drive yourself to the hospital. You may need a glucagon shot if you have very low blood sugar  and you cannot eat or drink. Have a family member or friend learn how to check your blood sugar and how to give you a glucagon shot. Ask your doctor if you should have a kit for glucagon shots. Follow these instructions at home: Medicines Take prescribed insulin or diabetes medicines as told by your health care provider. Do not run out of insulin or other medicines. Plan ahead. If you use insulin, change the amount you take based on how active you are and what foods you eat. Your doctor will tell you how to do this. Take over-the-counter and prescription medicines only as told by your doctor. Eating and drinking  Eat healthy foods. These include: Low-fat (lean) proteins. Complex carbs, such as whole grains. Fresh fruits and vegetables. Low-fat dairy products. Healthy fats. Meet with a food expert (dietitian) to make an eating plan. Follow instructions from your doctor about what you cannot eat or drink. Drink enough fluid to keep your pee (urine) pale yellow. Keep track of carbs that you eat. Read food labels and learn serving sizes of foods. Follow your sick-day plan when you cannot eat or drink as normal. Make this plan with your doctor so it is ready to use. Activity Exercise as told by your doctor. You may need to: Do stretching and strength exercises two or more times a week. Do 150 minutes or more of exercise each week that makes your heart beat faster and makes you sweat. Spread out your exercise over 3 or more days a week. Do not go more than 2 days in a row without exercise. Talk with your doctor before you start a new exercise. Your doctor may tell you to change: How much insulin or medicines you take. How much food you eat. Lifestyle Do not smoke or use any products that contain nicotine or tobacco. If you need help quitting, ask your doctor. If you drink alcohol and your doctor says that it is safe for you: Limit how much you have to: 0-1 drink a day for women who are not  pregnant. 0-2 drinks a day for men. Know how much alcohol is in your drink. In the [place] one drink equals one 12 oz bottle of beer (355 mL), one 5 oz glass of wine (148 mL), or one 1 oz glass of hard liquor (44 mL). Learn to deal with stress. If you need help, ask your doctor. Body care  Stay up to date with your shots (immunizations). Have your eyes and feet checked by a doctor as often as told. Check your skin and feet every day. Check for cuts, bruises, redness, blisters, or sores. Brush your teeth and gums two times a day. Floss one or more times a day. Go to the dentist one or more times every 6 months. Stay at a healthy weight. General instructions Share your diabetes care plan with: Your work or school. People you live with. Carry a card or wear jewelry that says you have diabetes. Keep all follow-up visits. Questions to ask your doctor Do I need to meet with a certified expert in diabetes education and care? Where can I find  a support group? Where to find more information For help and guidance and more information about diabetes, please go to: American Diabetes Association: www.diabetes.org American Association of Diabetes Care and Education Specialists: www.diabeteseducator.org International Diabetes Federation: MemberVerification.ca Summary When you have type 2 diabetes, you must make sure your blood sugar (glucose) stays in a healthy range. You can do this with nutrition, exercise, medicines and insulin, and support from doctors and others. Check your blood sugar every day, or as often as told. Having diabetes can raise your risk for other long-term health problems. You may get medicines to help prevent these problems. Share your diabetes management plan with people at work, school, and home. Keep all follow-up visits. This information is not intended to replace advice given to you by your health care provider. Make sure you discuss any questions you have with your health care  provider. Document Revised: 07/05/2020 Document Reviewed: 07/05/2020 Elsevier Patient Education  [place].

## 2022-01-13 ENCOUNTER — Ambulatory Visit (INDEPENDENT_AMBULATORY_CARE_PROVIDER_SITE_OTHER): Payer: Medicare Other | Admitting: Pharmacist

## 2022-01-13 DIAGNOSIS — I10 Essential (primary) hypertension: Secondary | ICD-10-CM

## 2022-01-13 DIAGNOSIS — I2510 Atherosclerotic heart disease of native coronary artery without angina pectoris: Secondary | ICD-10-CM

## 2022-01-13 DIAGNOSIS — E1169 Type 2 diabetes mellitus with other specified complication: Secondary | ICD-10-CM

## 2022-01-13 DIAGNOSIS — Z794 Long term (current) use of insulin: Secondary | ICD-10-CM

## 2022-01-13 NOTE — Progress Notes (Signed)
Chronic Care Management Pharmacy Note  01/13/2022 Name:  Kristen [name] MRN:  [id number] DOB:  11-25-1940  Summary: CCM F/U visit -Reviewed medications, pt affirms adherence with pill packs -DM: A1c 8.4% (11/2021), pt had declined med changed and is working on diet -HTN: BP at goal in recent OV, granddaughter checks her BP occasionally at home  Recommendations/Changes made from today's visit: -No med changes  Plan: -Transition CCM to Self Care: Patient achieved CCM goals and no longer needs to be contacted as frequently. The patient has been provided with contact information for the care management team and has been advised to call with any health related questions or concerns.  -PCP appt 06/07/22   Subjective: Kristen [name] is an 81 y.o. year old female who is a primary patient of Kristen Bush, MD.  The CCM team was consulted for assistance with disease management and care coordination needs.    Engaged with patient by telephone for follow up visit in response to provider referral for pharmacy case management and/or care coordination services.   Consent to Services:  The patient was given information about Chronic Care Management services, agreed to services, and gave verbal consent prior to initiation of services.  Please see initial visit note for detailed documentation.   Patient Care Team: Kristen Bush, MD as PCP - General (Family Medicine) Kristen [name] November, MD as Consulting Physician (Cardiology) [place], Kristen Gibson, RN as [place], Kristen [name], [facility] as Pharmacist (Pharmacist) Dannielle Karvonen, RN as [place] Management  Recent office visits: 12/05/21 Dr [name] OV: f/u DM - A1c 8.4%, pt declines med changes. Reassess CPE 6 months.   05/30/21 Dr [name] OV: annual exam - A1c 9.2. Start Jardiance 10 mg. Start  Colchicine 0.6 mg PRN for gout flare.  Recent consult visits: [facility]  visits: 05/30/21 ED visit [facility]): flank pain. Rx'd oxycodone-apap 5-325 mg #6.   Objective:  Lab Results  Component Value Date   CREATININE 1.06 (H) 05/30/2021   BUN 22 05/30/2021   GFR 59.88 (L) 05/30/2021   GFRNONAA 53 (L) 05/30/2021   GFRAA >60 02/20/2018   NA 139 05/30/2021   K 3.4 (L) 05/30/2021   CALCIUM 9.5 05/30/2021   CO2 27 05/30/2021   GLUCOSE 246 (H) 05/30/2021    Lab Results  Component Value Date/Time   HGBA1C 8.4 (A) 12/05/2021 10:28 AM   HGBA1C 9.2 (H) 05/30/2021 10:46 AM   HGBA1C 8.0 (A) 01/05/2021 11:15 AM   HGBA1C 10.7 (H) 03/16/2020 05:37 AM   FRUCTOSAMINE 497 (H) 03/30/2020 10:36 AM   GFR 59.88 (L) 05/30/2021 10:46 AM   GFR 46.30 (L) 01/05/2021 11:45 AM   MICROALBUR 11.5 (H) 05/30/2021 10:46 AM   MICROALBUR 7.8 (H) 08/09/2016 10:28 AM    Last diabetic Eye exam:  Lab Results  Component Value Date/Time   HMDIABEYEEXA No Retinopathy 04/26/2021 12:00 AM    Last diabetic Foot exam: No results found for: "HMDIABFOOTEX"   Lab Results  Component Value Date   CHOL 134 05/30/2021   HDL 41.40 05/30/2021   LDLCALC 71 05/30/2021   LDLDIRECT 102.0 12/31/2015   TRIG 110.0 05/30/2021   CHOLHDL 3 05/30/2021       Latest Ref Rng & Units 05/30/2021    8:15 PM 05/30/2021   10:46 AM 03/30/2020   10:36 AM  Hepatic Function  Total Protein 6.5 - 8.1 g/dL 6.4  6.9  7.1   Albumin 3.5 - 5.0 g/dL 3.2  3.9  4.0   AST 15 - 41 U/L '22  17  15   ' ALT 0 - 44 U/L '22  18  21   ' Alk Phosphatase 38 - 126 U/L 86  92  117   Total Bilirubin 0.3 - 1.2 mg/dL 0.4  0.7  1.0     Lab Results  Component Value Date/Time   TSH 4.88 05/30/2021 10:46 AM   TSH 3.71 01/05/2021 11:45 AM   FREET4 0.94 05/30/2021 10:46 AM   FREET4 0.90 01/05/2021 11:45 AM       Latest Ref Rng & Units 05/30/2021    8:15 PM 05/30/2021   10:46 AM 01/05/2021   11:45 AM  CBC  WBC 4.0 - 10.5 K/uL 7.5  7.3  8.4   Hemoglobin 12.0 - 15.0 g/dL 10.7  11.4  11.1   Hematocrit 36.0 - 46.0 % 33.8  35.8  35.2   Platelets  150 - 400 K/uL 217  233.0  247.0    Iron/TIBC/Ferritin/ %Sat    Component Value Date/Time   IRON 37 06/29/2015 1234   IRON 42 (L) 08/17/2011 1129   TIBC 330 06/29/2015 1234   TIBC 292 08/17/2011 1129   FERRITIN 42 06/29/2015 1234   FERRITIN 68 08/17/2011 1129   IRONPCTSAT 11 06/29/2015 1234   IRONPCTSAT 14 08/17/2011 1129     Lab Results  Component Value Date/Time   VD25OH 40.11 05/30/2021 10:46 AM   VD25OH 29.48 (L) 03/30/2020 10:36 AM    Clinical ASCVD: Yes  The ASCVD Risk score (Arnett DK, et al., 2019) failed to calculate for the following reasons:   The 2019 ASCVD risk score is only valid for ages 92 to 43       04/05/2021   12:39 PM 04/08/2020    1:35 PM 03/31/2020   10:37 AM  Depression screen PHQ 2/9  Decreased Interest 0 0 0  Down, Depressed, Hopeless 0 0 0  PHQ - 2 Score 0 0 0  Altered sleeping   0  Tired, decreased energy   0  Change in appetite   0  Feeling bad or failure about yourself    0  Trouble concentrating   0  Moving slowly or fidgety/restless   0  Suicidal thoughts   0  PHQ-9 Score   0  Difficult doing work/chores   Not difficult at all     Social History   Tobacco Use  Smoking Status Never  Smokeless Tobacco Never  Tobacco Comments   tobacco use- no    BP Readings from Last 3 Encounters:  12/05/21 134/80  05/31/21 (!) 187/70  05/30/21 (!) 166/80   Pulse Readings from Last 3 Encounters:  12/05/21 64  05/31/21 60  05/30/21 68   Wt Readings from Last 3 Encounters:  12/05/21 189 lb 8 oz (86 kg)  05/30/21 193 lb 6 oz (87.7 kg)  04/05/21 192 lb (87.1 kg)   BMI Readings from Last 3 Encounters:  12/05/21 34.94 kg/m  05/30/21 35.66 kg/m  04/05/21 36.28 kg/m    Assessment/Interventions: Review of patient past medical history, allergies, medications, health status, including review of consultants reports, laboratory and other test data, was performed as part of comprehensive evaluation and provision of chronic care management  services.   SDOH:  (Social Determinants of Health) assessments and interventions performed: No - done 07/2021 SDOH Interventions    Flowsheet Row Patient Outreach Telephone from 08/08/2021 in [place] Management from 06/25/2020 in [place]  HealthCare at [facility] Patient Outreach Telephone from 04/08/2020 in [place] Patient Outreach Telephone from 04/01/2020 in [place] from 03/31/2020 in [place] at [facility] Patient Outreach Telephone from 09/18/2019 in [place] Interventions        Food Insecurity Interventions Intervention Not Indicated -- Intervention Not Indicated -- -- Intervention Not Indicated  Housing Interventions Intervention Not Indicated -- -- -- -- Intervention Not Indicated  Transportation Interventions Intervention Not Indicated -- -- Intervention Not Indicated -- Intervention Not Indicated  Depression Interventions/Treatment  -- -- -- -- PHQ2-9 Score <4 Follow-up Not Indicated --  Financial Strain Interventions -- Other (Comment)  [Basaglar and Januvia through PAP] -- -- -- --      [place]: No Food Insecurity (08/08/2021)  Housing: Low Risk  (08/08/2021)  Transportation Needs: No Transportation Needs (08/08/2021)  Alcohol Screen: Low Risk  (04/05/2021)  Depression (PHQ2-9): Low Risk  (04/05/2021)  Financial Resource Strain: Low Risk  (04/05/2021)  Physical Activity: Inactive (04/05/2021)  Social Connections: Moderately Isolated (04/05/2021)  Stress: No Stress Concern Present (04/05/2021)  Tobacco Use: Low Risk  (12/05/2021)    CCM Care Plan  Allergies  Allergen Reactions   Bee Venom Anaphylaxis   Peanut-Containing Drug Products Itching   Aleve [Naproxen Sodium] Other (See Comments)    Spits up blood   Doxycycline Other (See Comments)    Malaise, GI  upset, "felt drunk" and very ill   Metformin And Related Other (See Comments)    Chills, dizziness   Penicillins Rash    Has patient had a PCN reaction causing immediate rash, facial/tongue/throat swelling, SOB or lightheadedness with hypotension: Yes Has patient had a PCN reaction causing severe rash involving mucus membranes or skin necrosis: No Has patient had a PCN reaction that required hospitalization No Has patient had a PCN reaction occurring within the last 10 years: No If all of the above answers are "NO", then may proceed with Cephalosporin use.     Medications Reviewed Today     Reviewed by Charlton Haws, [facility] (Pharmacist) on 01/13/22 at 63  Med List Status: <None>   Medication Order Taking? Sig Documenting Provider Last Dose Status Informant  0.9 %  sodium chloride infusion [id number]   Armbruster, Carlota Raspberry, MD  Active   amLODipine (NORVASC) 5 MG tablet [id number] Yes TAKE ONE TABLET BY MOUTH ONCE DAILY Kristen Bush, MD Taking Active   atorvastatin (LIPITOR) 40 MG tablet [id number] Yes TAKE ONE TABLET BY MOUTH ONCE DAILY Kristen Bush, MD Taking Active   bismuth-metronidazole-tetracycline [facility]) [phone] MG capsule [id number] Yes Take 3 capsules by mouth 4 (four) times daily -  before meals and at bedtime. [name], MD Taking Active   Blood Glucose Monitoring Suppl [facility] VERIO) w/Device Drucie Opitz [id number] Yes Use to check blood sugar up to 4 times a day as directed Kristen Bush, MD Taking Active   Cholecalciferol (VITAMIN D) 2000 units CAPS [id number] Yes Take 2,000 Units by mouth daily. [provider] Taking Active Self           Med Note Nadara Eaton Feb 20, 2018 11:50 AM)    clopidogrel (PLAVIX) 75 MG tablet [id number] Yes TAKE ONE TABLET BY MOUTH EVERY MORNING Kristen Bush, MD Taking Active   Colchicine (MITIGARE) 0.6 MG CAPS [id number] Yes Take 1 tablet by mouth daily as needed (for gout). Kristen Bush, MD  Taking Active  COMFORT EZ PEN NEEDLES 31G X 5 MM MISC [id number] Yes USE ONCE daily Kristen Bush, MD Taking Active   glucose blood [facility] VERIO) test strip [id number] Yes USE TO check blood glucose AS DIRECTED four times daily Kristen Bush, MD Taking Active   hydrALAZINE (APRESOLINE) 50 MG tablet [id number] Yes TAKE ONE TABLET BY MOUTH EVERY MORNING and TAKE ONE TABLET BY MOUTH EVERYDAY AT BEDTIME Kristen Bush, MD Taking Active   hydrochlorothiazide (HYDRODIURIL) 12.5 MG tablet [id number] Yes TAKE ONE TABLET BY MOUTH EVERY MORNING Kristen Bush, MD Taking Active   Insulin Glargine [facility] KWIKPEN) 100 UNIT/ML [id number] Yes Inject 18 Units into the skin at bedtime. [provider] Taking Active Self  Lancets (ONETOUCH DELICA PLUS [id number]) [place] [id number] Yes USE TO check blood glucose four times daily AS DIRECTED Kristen Bush, MD Taking Active   losartan (COZAAR) 100 MG tablet [id number] Yes TAKE ONE TABLET BY MOUTH EVERY MORNING Kristen Bush, MD Taking Active   metoprolol succinate (TOPROL-XL) 25 MG 24 hr tablet [id number] Yes TAKE ONE TABLET BY MOUTH ONCE DAILY Kristen Bush, MD Taking Active   nitroGLYCERIN (NITROSTAT) 0.4 MG SL tablet [id number] Yes Place 0.4 mg under the tongue every 5 (five) minutes as needed for chest pain. [provider] Taking Active Self           Med Note [place] Crane, MISTY D   Tue May 31, 2021  4:57 AM)    nystatin cream (MYCOSTATIN) [id number] Yes Apply 1 application topically 2 (two) times daily.  Patient taking differently: Apply 1 application  topically daily as needed for dry skin (rash).   Kristen Bush, MD Taking Active   potassium chloride (KLOR-CON) 10 MEQ tablet [id number] Yes TAKE ONE TABLET BY MOUTH ONCE WEEKLY ON MONDAY and TAKE ONE TABLET BY MOUTH ONCE WEEKLY ON WEDNESDAY and TAKE ONE TABLET BY MOUTH ONCE WEEKLY ON Roby Lofts, MD Taking Active   vitamin B-12 (CYANOCOBALAMIN) 1000 MCG tablet [id number]  Yes Take 1 tablet (1,000 mcg total) by mouth daily. Kristen Bush, MD Taking Active   Med List Note [name] 03/16/20 8832): [phone]  [id number]            Patient Active Problem List   Diagnosis Date Noted   Left sided abdominal pain 05/30/2021   Type 2 diabetes mellitus with moderate nonproliferative diabetic retinopathy without macular edema, left eye ([place]) 01/21/2021   Low serum vitamin B12 01/10/2021   Fatigue 01/05/2021   Chronic kidney disease, stage 3a ([place]) 03/18/2020   Hypertensive urgency 03/16/2020   Hip osteoarthritis 03/11/2019   Pain due to onychomycosis of toenails of both feet 02/10/2019   Type 2 diabetes mellitus with vascular disease ([place]) 02/10/2019   Polypharmacy 02/04/2018   Seizure ([place]) 09/11/2017   Aphasia    Borderline hypothyroidism 03/12/2017   Disturbance of skin sensation 09/21/2016   Hemiparesis of right dominant side ([place]) 09/02/2016   Intertrigo 08/16/2016   Vitamin D deficiency 08/08/2016   Gout 03/24/2016   Syncope 02/29/2016   Concussion 02/29/2016   Arthritis of left wrist 02/07/2016   History of CVA (cerebrovascular accident) 11/03/2015   Aortic atherosclerosis ([place])    Anemia 06/18/2015   Sensorineural hearing loss (SNHL) of both ears 03/29/2015   Pain of right middle finger 12/22/2014   Memory deficit 12/04/2014   Primary osteoarthritis of left knee 01/27/2014   Advanced care planning/counseling discussion 12/11/2013   Medicare annual wellness visit, subsequent 12/11/2013   Encounter for general adult medical examination with abnormal  findings 12/11/2013   CAD (coronary artery disease)/coronary calcifications    Chest pain 07/17/2013   Benign paroxysmal positional vertigo 05/26/2013   Hyperlipidemia associated with type 2 diabetes mellitus (HCC)    Severe obesity (BMI 35.0-39.9) with comorbidity ([place])    History of pulmonary embolism    Osteoarthritis    Type 2 diabetes mellitus with  hyperglycemia ([place])    History of DVT (deep vein thrombosis) 12/21/2010   Resistant hypertension 05/13/2009    Immunization History  Administered Date(s) Administered   Fluad Quad(high Dose 65+) 03/30/2020   PFIZER Comirnaty([place])Covid-19 Tri-Sucrose Vaccine 06/22/2020, 07/13/2020   PPD Test 09/25/2014   Pneumococcal Conjugate-13 09/10/2013   Pneumococcal Polysaccharide-23 03/10/2015   Zoster Recombinat (Shingrix) 07/15/2021   Zoster, Live 04/21/2014    Conditions to be addressed/monitored:  Hypertension, Hyperlipidemia, Diabetes, and Coronary Artery Disease  Care Plan : [place]  Updates made by Charlton Haws, [place] since 01/13/2022 12:00 AM     Problem: Hypertension, Hyperlipidemia, Diabetes, and Coronary Artery Disease   Priority: High     Long-Range Goal: Disease Management   Start Date: 06/25/2020  Expected End Date: 09/15/2022  Recent Progress: On track  Priority: High  Note:   Current Barriers:  Unable to maintain control of DM  Pharmacist Clinical Goal(s):  Patient will adhere to plan to optimize therapeutic regimen for DM as evidenced by report of adherence to recommended medication management changes through collaboration with PharmD and provider.   Interventions: 1:1 collaboration with Kristen Bush, MD regarding development and update of comprehensive plan of care as evidenced by provider attestation and co-signature Inter-disciplinary care team collaboration (see longitudinal plan of care) Comprehensive medication review performed; medication list updated in electronic medical record  Hypertension (BP goal <140/90) -Controlled -  BP at goal in recent OV -Current treatment: Hydralazine 50 mg BID -Appropriate, Effective, Safe, Accessible Amlodipine 5 mg daily -Appropriate, Effective, Safe, Accessible Metoprolol Succinate 25 mg - 1/2 tablet daily -Appropriate, Effective, Safe, Accessible Losartan 100 mg daily -Appropriate, Effective,  Safe, Accessible HCTZ 12.5 mg daily -Appropriate, Effective, Safe, Accessible Potassium Chloride 10 mEq MWF -Appropriate, Effective, Safe, Accessible -Medications previously tried: none   -Diet: She limits pork and salty foods.  -Counseled to monitor BP at home 2-3 days per week; alert PCP if readings are consistently > 150/90 -Recommended to continue current medication  Hyperlipidemia: (LDL goal < 70) -Controlled - LDL 71 (05/2021) appropriate -Significant ASCVD hx - history of CVA, MI  -Current treatment: Atorvastatin 40 mg daily - Appropriate, Effective, Safe, Accessible Clopidogrel 75 mg daily - Appropriate, Effective, Safe, Accessible -Medications previously tried: none -Recommended to continue current medication  Diabetes (A1c goal 7.5-8%) -Query controlled - A1c 8.4% (11/2021) above goal, pt wanted to work on diet so no med changes at that time -Current home glucose readings: fasting glucose: 116, 113 bedtime: <200 Denies hypoglycemic/hyperglycemic symptoms -Current medications: Basaglar 18 units daily - Appropriate, Query Effective Testing supplies -Medications previously tried: metformin (GI), Januvia, Jardiance (cost - denied PAP) -Reviewed CGM benefits, pt declined, she is ok with finger sticks for now -Recommended to continue current medication  Patient Goals/Self-Care Activities Patient will:  - take medications as prescribed as evidenced by patient report and record review focus on medication adherence by pill packs check glucose twice daily, document, and provide at future appointments check blood pressure periodically, document, and provide at future appointments        Medication Assistance:  [name] [employer]) approved through 04/23/22  Compliance/Adherence/Medication  fill history: Care Gaps: None  Star-Rating Drugs: Atorvastatin - PDC 99% Losartan - PDC 100%  Medication Access: Within the past 30 days, how often has patient missed a dose of  medication? 0 Is a pillbox or other method used to improve adherence? Yes  Factors that may affect medication adherence? no barriers identified Are meds synced by current pharmacy? Yes  Are meds delivered by current pharmacy? Yes  Does patient experience delays in picking up medications due to transportation concerns? No   [facility] Reviewed:  Is patient disadvantaged to use [facility]?: No  Current Rx insurance plan: [facility] [place] Name and location of Current pharmacy:  [facility] - [place], [place] - [address] [place] [address] Phone: [phone] Fax: [phone]  90 day pill packs (12/06/21) Packs: Atorvastatin 40 mg 1 tablet daily (breakfast) Metoprolol Succinate 25 mg take 1/2  tablet daily (breakfast)  Hydralazine 50 mg 1 tablet twice daily (1-breakfast, 1-bedtime) Clopidogrel 75 mg 1 tablet daily (breakfast) Potassium chloride ER 10 Meq 1 tablet daily on Mon, Wed, Friday (breakfast) Amlodipine 5 mg 1 tablet daily (breakfast) Losartan 100 mg 1 tablet daily (breakfast Hydrochlorothiazide 12.5 mg 1 tablet daily (breakfast)   VIAL medications: Pen needles use as directed once daily at bedtime One touch delica plus lancets 03P   Care Plan and Follow Up Patient Decision:  Patient agrees to Care Plan and Follow-up.  Plan: The patient has been provided with contact information for the care management team and has been advised to call with any health related questions or concerns.   [name], PharmD, BCACP Clinical Pharmacist [place] [facility] at [facility] [phone]

## 2022-01-13 NOTE — Patient Instructions (Addendum)
Visit Information  Phone number for Pharmacist: [phone]   Goals Addressed   None     Care Plan : [place]  Updates made by Charlton Haws, RPH since 01/13/2022 12:00 AM     Problem: Hypertension, Hyperlipidemia, Diabetes, and Coronary Artery Disease   Priority: High     Long-Range Goal: Disease Management   Start Date: 06/25/2020  Expected End Date: 09/15/2022  Recent Progress: On track  Priority: High  Note:   Current Barriers:  Unable to maintain control of DM  Pharmacist Clinical Goal(s):  Patient will adhere to plan to optimize therapeutic regimen for DM as evidenced by report of adherence to recommended medication management changes through collaboration with PharmD and provider.   Interventions: 1:1 collaboration with [name], MD regarding development and update of comprehensive plan of care as evidenced by provider attestation and co-signature Inter-disciplinary care team collaboration (see longitudinal plan of care) Comprehensive medication review performed; medication list updated in electronic medical record  Hypertension (BP goal <140/90) -Controlled -  BP at goal in recent OV -Current treatment: Hydralazine 50 mg BID -Appropriate, Effective, Safe, Accessible Amlodipine 5 mg daily -Appropriate, Effective, Safe, Accessible Metoprolol Succinate 25 mg - 1/2 tablet daily -Appropriate, Effective, Safe, Accessible Losartan 100 mg daily -Appropriate, Effective, Safe, Accessible HCTZ 12.5 mg daily -Appropriate, Effective, Safe, Accessible Potassium Chloride 10 mEq MWF -Appropriate, Effective, Safe, Accessible -Medications previously tried: none   -Diet: She limits pork and salty foods.  -Counseled to monitor BP at home 2-3 days per week; alert PCP if readings are consistently > 150/90 -Recommended to continue current medication  Hyperlipidemia: (LDL goal < 70) -Controlled - LDL 71 (05/2021) appropriate -Significant ASCVD hx - history  of CVA, MI  -Current treatment: Atorvastatin 40 mg daily - Appropriate, Effective, Safe, Accessible Clopidogrel 75 mg daily - Appropriate, Effective, Safe, Accessible -Medications previously tried: none -Recommended to continue current medication  Diabetes (A1c goal 7.5-8%) -Query controlled - A1c 8.4% (11/2021) above goal, pt wanted to work on diet so no med changes at that time -Current home glucose readings: fasting glucose: 116, 113 bedtime: <200 Denies hypoglycemic/hyperglycemic symptoms -Current medications: Basaglar 18 units daily - Appropriate, Query Effective Testing supplies -Medications previously tried: metformin (GI), Januvia, Jardiance (cost - denied PAP) -Reviewed CGM benefits, pt declined, she is ok with finger sticks for now -Recommended to continue current medication  Patient Goals/Self-Care Activities Patient will:  - take medications as prescribed as evidenced by patient report and record review focus on medication adherence by pill packs check glucose twice daily, document, and provide at future appointments check blood pressure periodically, document, and provide at future appointments       Patient verbalizes understanding of instructions and care plan provided today and agrees to view in [place]. Active MyChart status and patient understanding of how to access instructions and care plan via MyChart confirmed with patient.    The patient has been provided with contact information for the care management team and has been advised to call with any health related questions or concerns.   [name], PharmD, BCACP Clinical Pharmacist [place] [facility] at [facility] [phone]

## 2022-01-20 ENCOUNTER — Ambulatory Visit (INDEPENDENT_AMBULATORY_CARE_PROVIDER_SITE_OTHER): Payer: Medicare Other | Admitting: Primary Care

## 2022-01-20 DIAGNOSIS — M542 Cervicalgia: Secondary | ICD-10-CM | POA: Diagnosis not present

## 2022-01-20 MED ORDER — KETOROLAC TROMETHAMINE 60 MG/2ML IM SOLN
60.0000 mg | Freq: Once | INTRAMUSCULAR | Status: AC
  Administered 2022-01-20: 60 mg via INTRAMUSCULAR

## 2022-01-20 MED ORDER — TIZANIDINE HCL 4 MG PO TABS: 4.0000 mg | ORAL_TABLET | Freq: Three times a day (TID) | ORAL | 0 refills | Status: DC | PRN

## 2022-01-20 NOTE — Assessment & Plan Note (Signed)
Likely muscle strain based on exam and HPI. Could very well be underlying cervical arthritis. No signs of acute stroke.   Offered cervical spine plain films for which she declines. IM Toradol 60 mg provided in office today. Rx for Tizanidine 4 mg provided to take every 8 hours, drowsiness precautions discussed. Encouraged stretching.   Consider PT/imaging if no improvement. Return precautions provided.

## 2022-01-20 NOTE — Addendum Note (Signed)
Addended by: Pat Kocher on: 01/20/2022 11:04 AM   Modules accepted: Orders

## 2022-01-20 NOTE — Patient Instructions (Signed)
You may take Tizanidine 4 mg every 8 hours as needed for neck pain and muscle spasms.  Try to stretch your neck when able.  Please follow up with Dr. Darnell Level if no improvement in your pain.  It was a pleasure meeting you!

## 2022-01-20 NOTE — Progress Notes (Signed)
Subjective:    Patient ID: Kristen [name], female    DOB: February 15, 1941, 81 y.o.   MRN: [id number]  HPI  Kristen [name] is a very pleasant 81 y.o. female patient of Dr. [name] with a history of CAD, type 2 diabetes, osteoarthritis, CVA with hemiparesis of right dominant side who presents today to discuss shoulder pain.  Her pain is located to the left mid cervical spine with radiation to the left lateral neck and left scapular region for which she first noticed about 3 days ago when waking from sleep. At first her pain was mild, "felt like I slept on my neck wrong". Last night her pain became worse, was unable to sleep much. She describes her pain as tightness and "pulling" sensation.   She's taken Tylenol and Ibuprofen without improvement. She has also applied several topical agents OTC without improvement.   She denies injury or trauma, right sided neck pain, numbness/tingling, changes in speech, increased left upper extremity weakness.   Review of Systems  Musculoskeletal:  Positive for arthralgias and myalgias.  Skin:  Negative for color change.  Neurological:  Negative for dizziness, speech difficulty and numbness.         Past Medical History:  Diagnosis Date   Abdominal aortic atherosclerosis ([place]) by CT 02/2014   Acute kidney injury ([place]) 02/29/2016   During hospitalization 07/2017   CAD (coronary artery disease)    by CT, per pt h/o MI   Cataract    bilateral-removed   Clotting disorder ([employer])    h/o PE   Diabetes type 2, uncontrolled    Frequent headaches    GERD (gastroesophageal reflux disease)    Gout    History of pulmonary embolism 2012   HLD (hyperlipidemia)    HTN (hypertension)    Internal capsule hemorrhage (H[facility])    hx of sublacunar infarct involving the right posterior limb of the internal capsule    Morbid obesity ([place])    Myocardial infarction ([place]) 2012   per pt. report, states she was treated with medicine, here at [facility]     Osteoarthritis     knees   Primary localized osteoarthritis of left knee 09/22/2014   Risk for falls 09/28/2017   Sleep apnea 2011   study done in [place], states that since she lost weight she doesn't use the CPAP any longer & she doesn't have a problem with sleep apnea   Stroke [name])    still has balance problem on occas. , uses cane but that's mainly for the left knee pain   Syncope 03/01/2016   Thoracic aortic atherosclerosis ([place]) 12/2015   by CXR   Vertigo    hx. benign postitional postural    Social History   Socioeconomic History   Marital status: Widowed    Spouse name: Not on file   Number of children: 5   Years of education: Not on file   Highest education level: Not on file  Occupational History   Occupation: retired  Tobacco Use   Smoking status: Never   Smokeless tobacco: Never   Tobacco comments:    tobacco use- no   Vaping Use   Vaping Use: Never used  Substance and Sexual Activity   Alcohol use: Not Currently   Drug use: No   Sexual activity: Not on file  Other Topics Concern   Not on file  Social History Narrative   Lives alone   4 grown children   Education: 11th grade   Occupation:  retired, was Scientist, water quality   Activity: Does not regularly exercise.    Diet: good water, fruits/vegetables seldom   Transportation: Patient has been educated on her transportation benefit through [place] Strain: Low Risk  (04/05/2021)   Overall Financial Resource Strain (CARDIA)    Difficulty of Paying Living Expenses: Not hard at all  Food Insecurity: No Food Insecurity (08/08/2021)   Hunger Vital Sign    Worried About Running Out of Food in the Last Year: Never true    Ran Out of Food in the Last Year: Never true  Transportation Needs: No Transportation Needs (08/08/2021)   PRAPARE - Hydrologist (Medical): No    Lack of Transportation (Non-Medical): No  Physical Activity: Inactive (04/05/2021)    Exercise Vital Sign    Days of Exercise per Week: 0 days    Minutes of Exercise per Session: 0 min  Stress: No Stress Concern Present (04/05/2021)   [place]    Feeling of Stress : Not at all  Social Connections: Moderately Isolated (04/05/2021)   Social Connection and Isolation Panel [NHANES]    Frequency of Communication with Friends and Family: More than three times a week    Frequency of Social Gatherings with Friends and Family: More than three times a week    Attends Religious Services: More than 4 times per year    Active Member of [employer] or Organizations: No    Attends Archivist Meetings: Never    Marital Status: Widowed  Intimate Partner Violence: Not At Risk (04/05/2021)   Humiliation, Afraid, Rape, and Kick questionnaire    Fear of Current or Ex-Partner: No    Emotionally Abused: No    Physically Abused: No    Sexually Abused: No    Past Surgical History:  Procedure Laterality Date   CATARACT EXTRACTION Bilateral 04/25/2011   COLONOSCOPY  07/2020   done for positive iFOB - TA and TVA (large) consider rpt 3 yrs (Armbruster)   ESOPHAGOGASTRODUODENOSCOPY  07/2020   H pylori positive (treated), dilation of upper esoph stricture   EYE SURGERY     /w IOL   FOOT SURGERY Right    PARTIAL HYSTERECTOMY     for fibroids, ovaries remain   TONSILLECTOMY     TOTAL KNEE ARTHROPLASTY Right 1990s   TOTAL KNEE ARTHROPLASTY Left 09/22/2014   [name], MD   TUBAL LIGATION      Family History  Problem Relation Age of Onset   Cancer Mother        bone   Diabetes Father    Hypertension Father    Cancer Son 18       lung   Congenital heart disease Son 54   Stroke Brother    Colon cancer Neg Hx    Colon polyps Neg Hx    Esophageal cancer Neg Hx    Stomach cancer Neg Hx    Rectal cancer Neg Hx     Allergies  Allergen Reactions   Bee Venom Anaphylaxis   Peanut-Containing Drug Products  Itching   Aleve [Naproxen Sodium] Other (See Comments)    Spits up blood   Doxycycline Other (See Comments)    Malaise, GI upset, "felt drunk" and very ill   Metformin And Related Other (See Comments)    Chills, dizziness   Penicillins Rash    Has patient had a PCN reaction  causing immediate rash, facial/tongue/throat swelling, SOB or lightheadedness with hypotension: Yes Has patient had a PCN reaction causing severe rash involving mucus membranes or skin necrosis: No Has patient had a PCN reaction that required hospitalization No Has patient had a PCN reaction occurring within the last 10 years: No If all of the above answers are "NO", then may proceed with Cephalosporin use.     Current Outpatient Medications on File Prior to Visit  Medication Sig Dispense Refill   amLODipine (NORVASC) 5 MG tablet TAKE ONE TABLET BY MOUTH ONCE DAILY 90 tablet 3   atorvastatin (LIPITOR) 40 MG tablet TAKE ONE TABLET BY MOUTH ONCE DAILY 90 tablet 3   bismuth-metronidazole-tetracycline (PYLERA) 140-125-125 MG capsule Take 3 capsules by mouth 4 (four) times daily -  before meals and at bedtime. 120 capsule 0   Blood Glucose Monitoring Suppl (ONETOUCH VERIO) w/Device KIT Use to check blood sugar up to 4 times a day as directed 1 kit 0   Cholecalciferol (VITAMIN D) 2000 units CAPS Take 2,000 Units by mouth daily.     clopidogrel (PLAVIX) 75 MG tablet TAKE ONE TABLET BY MOUTH EVERY MORNING 90 tablet 1   Colchicine (MITIGARE) 0.6 MG CAPS Take 1 tablet by mouth daily as needed (for gout). 30 capsule 3   COMFORT EZ PEN NEEDLES 31G X 5 MM MISC USE ONCE daily 100 each 3   glucose blood (ONETOUCH VERIO) test strip USE TO check blood glucose AS DIRECTED four times daily 100 strip 3   hydrALAZINE (APRESOLINE) 50 MG tablet TAKE ONE TABLET BY MOUTH EVERY MORNING and TAKE ONE TABLET BY MOUTH EVERYDAY AT BEDTIME 180 tablet 3   hydrochlorothiazide (HYDRODIURIL) 12.5 MG tablet TAKE ONE TABLET BY MOUTH EVERY MORNING 90 tablet  3   Insulin Glargine (BASAGLAR KWIKPEN) 100 UNIT/ML Inject 18 Units into the skin at bedtime.     Lancets (ONETOUCH DELICA PLUS FEXMDY70L) MISC USE TO check blood glucose four times daily AS DIRECTED 100 each 3   losartan (COZAAR) 100 MG tablet TAKE ONE TABLET BY MOUTH EVERY MORNING 90 tablet 3   metoprolol succinate (TOPROL-XL) 25 MG 24 hr tablet TAKE ONE TABLET BY MOUTH ONCE DAILY 90 tablet 3   nitroGLYCERIN (NITROSTAT) 0.4 MG SL tablet Place 0.4 mg under the tongue every 5 (five) minutes as needed for chest pain.     nystatin cream (MYCOSTATIN) Apply 1 application topically 2 (two) times daily. (Patient taking differently: Apply 1 application  topically daily as needed for dry skin (rash).) 30 g 0   potassium chloride (KLOR-CON) 10 MEQ tablet TAKE ONE TABLET BY MOUTH ONCE WEEKLY ON MONDAY and TAKE ONE TABLET BY MOUTH ONCE WEEKLY ON WEDNESDAY and TAKE ONE TABLET BY MOUTH ONCE WEEKLY ON FRIDAY 40 tablet 3   vitamin B-12 (CYANOCOBALAMIN) 1000 MCG tablet Take 1 tablet (1,000 mcg total) by mouth daily.     Current Facility-Administered Medications on File Prior to Visit  Medication Dose Route Frequency Provider Last Rate Last Admin   0.9 %  sodium chloride infusion  500 mL Intravenous Once Armbruster, Carlota Raspberry, MD        BP (!) 148/96   Pulse 78   Temp 98.1 F (36.7 C) (Temporal)   Ht 5' 1.75" (1.568 m)   Wt 192 lb (87.1 kg)   SpO2 97%   BMI 35.40 kg/m  Objective:   Physical Exam Neck:     Comments: Decrease in ROM with pain in most planes of movement. Muscle tension noted  to left lateral neck.  Musculoskeletal:     Cervical back: Pain with movement and muscular tenderness present. No spinous process tenderness. Decreased range of motion.  Lymphadenopathy:     Cervical: No cervical adenopathy.  Neurological:     Mental Status: She is alert and oriented to person, place, and time.     Sensory: No sensory deficit.     Gait: Gait normal.           Assessment & Plan:   Problem  List Items Addressed This Visit       Other   Acute neck pain    Likely muscle strain based on exam and HPI. Could very well be underlying cervical arthritis. No signs of acute stroke.   Offered cervical spine plain films for which she declines. IM Toradol 60 mg provided in office today. Rx for Tizanidine 4 mg provided to take every 8 hours, drowsiness precautions discussed. Encouraged stretching.   Consider PT/imaging if no improvement. Return precautions provided.       Relevant Medications   tiZANidine (ZANAFLEX) 4 MG tablet       Pleas Koch, NP

## 2022-01-21 DIAGNOSIS — E785 Hyperlipidemia, unspecified: Secondary | ICD-10-CM | POA: Diagnosis not present

## 2022-01-21 DIAGNOSIS — Z794 Long term (current) use of insulin: Secondary | ICD-10-CM | POA: Diagnosis not present

## 2022-01-21 DIAGNOSIS — E1159 Type 2 diabetes mellitus with other circulatory complications: Secondary | ICD-10-CM

## 2022-01-21 DIAGNOSIS — I10 Essential (primary) hypertension: Secondary | ICD-10-CM

## 2022-02-13 ENCOUNTER — Telehealth: Payer: Self-pay | Admitting: *Deleted

## 2022-02-13 NOTE — Patient Outreach (Signed)
  Care Coordination   Initial Visit Note   02/13/2022 Name: LEATHA ROHNER MRN: [id number] DOB: 07-18-1940  Elliet Goodnow Brodt is a 81 y.o. year old female who sees [name], MD for primary care. I spoke with  Michell Heinrich Soria by phone today.  What matters to the patients health and wellness today?  Patient is trying to find out places to buy a cane chair  RN gave patient a list of places that have cane chairs.   SDOH assessments and interventions completed:  Yes     Care Coordination Interventions Activated:  Yes  Care Coordination Interventions:  Yes, provided   Follow up plan: No further intervention required.   Encounter Outcome:  Pt. Visit Completed   [facility] [phone]

## 2022-02-14 ENCOUNTER — Ambulatory Visit: Payer: Self-pay

## 2022-02-14 NOTE — Patient Outreach (Addendum)
  Care Coordination   02/14/2022 Name: Kristen [name] MRN: [id number] DOB: 01-09-1941   Care Coordination Outreach Attempts:  An unsuccessful telephone outreach was attempted for a scheduled appointment today.Telephone call to patient x2.  Unable to reach or leave voice message due to phone only ringing.   Follow Up Plan:  Additional outreach attempts will be made to offer the patient care coordination information and services.   Encounter Outcome:  No Answer  Care Coordination Interventions Activated:  No   Care Coordination Interventions:  No, not indicated    Quinn Plowman RN,BSN,CCM [place] [phone] direct line

## 2022-02-20 ENCOUNTER — Ambulatory Visit: Payer: Self-pay

## 2022-02-20 NOTE — Patient Outreach (Signed)
  Care Coordination   Follow Up Visit Note   02/20/2022 Name: CAMBREA KIRT MRN: [id number] DOB: June 27, 1940  Guynell Kleiber Bezold is a 81 y.o. year old female who sees [name], MD for primary care. I spoke with  Michell Heinrich Ciszek by phone today.  What matters to the patients health and wellness today?  Patient states has not purchased her cane chair but knows where to get it when she is ready to purchase.  Patient reports this mornings fasting blood sugar was 132. She reports eating potato chips last night before bed.  Patient states her blood sugars over the past few weeks have ranged from 108 to 170.  Denies blood sugars <70.  Patient states she knows she is eating to much bread.     Goals Addressed             This Visit's Progress    Patient stated:  " Managing my diabetes"       Care Coordination Interventions: Evaluation of current treatment plan related to Type 2 Diabetes and patient's adherence to plan as established by provider Encouraged patient to continue to check blood sugars regularly and record Reviewed medications with patient and discussed importance of compliance Reviewed scheduled/upcoming provider appointments Discussed patients diet choices.  Advised to follow low carb meal plan.  Advised to limit carbs/ sugars and choose more nutrient dense foods  Discussed patients blood sugar readings and last Hgb A1c           SDOH assessments and interventions completed:  No     Care Coordination Interventions Activated:  Yes  Care Coordination Interventions:  Yes, provided   Follow up plan: Follow up call scheduled for 04/11/22 at 9:30 am    Encounter Outcome:  Pt. Visit Completed   Quinn Plowman RN,BSN,CCM [place] [phone] direct line

## 2022-02-23 ENCOUNTER — Other Ambulatory Visit: Payer: Self-pay | Admitting: Family Medicine

## 2022-02-23 ENCOUNTER — Telehealth: Payer: Self-pay

## 2022-02-23 DIAGNOSIS — E1165 Type 2 diabetes mellitus with hyperglycemia: Secondary | ICD-10-CM

## 2022-02-23 DIAGNOSIS — I1A0 Resistant hypertension: Secondary | ICD-10-CM

## 2022-02-23 NOTE — Telephone Encounter (Signed)
Plavix Last filled:  12/05/21, #90 Last OV:  12/05/21, 6 mo f/u Next OV:  06/07/22, CPE

## 2022-02-23 NOTE — Chronic Care Management (AMB) (Signed)
  Chronic Care Management Pharmacy Assistant   Name: Kristen [name]  MRN: [id number] DOB: 07/10/1940  Reason for Encounter: Medication Adherence and Delivery Coordination    Recent office visits:  01/20/22-Katherine Clark,NP(fam med)-shoulder pain,IM Toradol 60 mg provided in office today. Rx for Tizanidine 4 mg provided to take every 8 hours,  12/05/21-Javier Gutierrez,MD(PCP)-f/u DM,DM foot exam, new A1c 8.4, no medication changes f/u 6 months  Recent consult visits:  None since last CCM contact  Hospital visits:  None in previous 6 months  Medications: Outpatient Encounter Medications as of 02/23/2022  Medication Sig   amLODipine (NORVASC) 5 MG tablet TAKE ONE TABLET BY MOUTH ONCE DAILY   atorvastatin (LIPITOR) 40 MG tablet TAKE ONE TABLET BY MOUTH ONCE DAILY   bismuth-metronidazole-tetracycline (PYLERA) 140-125-125 MG capsule Take 3 capsules by mouth 4 (four) times daily -  before meals and at bedtime.   Blood Glucose Monitoring Suppl (ONETOUCH VERIO) w/Device KIT Use to check blood sugar up to 4 times a day as directed   Cholecalciferol (VITAMIN D) 2000 units CAPS Take 2,000 Units by mouth daily.   clopidogrel (PLAVIX) 75 MG tablet TAKE ONE TABLET BY MOUTH EVERY MORNING   Colchicine (MITIGARE) 0.6 MG CAPS Take 1 tablet by mouth daily as needed (for gout).   COMFORT EZ PEN NEEDLES 31G X 5 MM MISC USE ONCE daily   glucose blood (ONETOUCH VERIO) test strip USE TO check blood glucose AS DIRECTED four times daily   hydrALAZINE (APRESOLINE) 50 MG tablet TAKE ONE TABLET BY MOUTH EVERY MORNING and TAKE ONE TABLET BY MOUTH EVERYDAY AT BEDTIME   hydrochlorothiazide (HYDRODIURIL) 12.5 MG tablet TAKE ONE TABLET BY MOUTH EVERY MORNING   Insulin Glargine (BASAGLAR KWIKPEN) 100 UNIT/ML Inject 18 Units into the skin at bedtime.   Lancets (ONETOUCH DELICA PLUS LANCET33G) MISC USE TO check blood glucose four times daily AS DIRECTED   losartan (COZAAR) 100 MG tablet TAKE ONE TABLET BY MOUTH EVERY  MORNING   metoprolol succinate (TOPROL-XL) 25 MG 24 hr tablet TAKE ONE TABLET BY MOUTH ONCE DAILY   nitroGLYCERIN (NITROSTAT) 0.4 MG SL tablet Place 0.4 mg under the tongue every 5 (five) minutes as needed for chest pain.   nystatin cream (MYCOSTATIN) Apply 1 application topically 2 (two) times daily. (Patient taking differently: Apply 1 application  topically daily as needed for dry skin (rash).)   potassium chloride (KLOR-CON) 10 MEQ tablet TAKE ONE TABLET BY MOUTH ONCE WEEKLY ON MONDAY and TAKE ONE TABLET BY MOUTH ONCE WEEKLY ON WEDNESDAY and TAKE ONE TABLET BY MOUTH ONCE WEEKLY ON FRIDAY   tiZANidine (ZANAFLEX) 4 MG tablet Take 1 tablet (4 mg total) by mouth every 8 (eight) hours as needed for muscle spasms.   vitamin B-12 (CYANOCOBALAMIN) 1000 MCG tablet Take 1 tablet (1,000 mcg total) by mouth daily.   Facility-Administered Encounter Medications as of 02/23/2022  Medication   0.9 %  sodium chloride infusion   BP Readings from Last 3 Encounters:  01/20/22 (!) 148/96  12/05/21 134/80  05/31/21 (!) 187/70    Lab Results  Component Value Date   HGBA1C 8.4 (A) 12/05/2021      Recent OV, Consult or Hospital visit:  01/20/22 family medicine-acute shoulder pain, toradol injection and short course muscle relaxer.      12/05/21 PCP -f/u DM-no changes  Last adherence delivery date:12/07/21      Patient is due for next adherence delivery on: 03/07/22(03/09/22 patient request)  Spoke with patient on 02/24/22 reviewed medications and   coordinated delivery.  This delivery to include: Adherence Packaging  90 Days  Packs: Atorvastatin 40 mg 1 tablet daily (breakfast) Metoprolol Succinate 25 mg take 1/2  tablet daily (breakfast)  Hydralazine 50 mg 1 tablet twice daily (1-breakfast, 1-bedtime) Clopidogrel 75 mg 1 tablet daily (breakfast) Potassium chloride ER 10 Meq 1 tablet daily on Mon, Wed, Friday (breakfast) Amlodipine 5 mg 1 tablet daily (breakfast) Losartan 100 mg 1 tablet daily  (breakfast Hydrochlorothiazide 12.5 mg 1 tablet daily (breakfast)  VIAL medications: Pen needles use as directed once daily at bedtime One touch delica plus lancets 33g   Patient declined the following medications this month: Basaglar- manufacturer Patient states manufacturer mailed her renewal forms   Any concerns about your medications? No  How often do you forget or accidentally miss a dose? Never  Do you use a pillbox? No  Is patient in packaging Yes  What is the date on your next pill pack? Patient reports cannot read date  Any concerns or issues with your packaging? satisfied   Refills requested from providers include:metoprolol,clopidogrel,one touch lancets  Confirmed delivery date of 03/09/22, advised patient that pharmacy will contact them the morning of delivery.  Recent blood pressure readings are as follows:none available   Recent blood glucose readings are as follows: Fasting:  02/24/22  136  Annual wellness visit in last year? Yes Most Recent BP reading:134/80  12/05/21  If Diabetic: Most recent A1C reading:8.4 Last eye exam / retinopathy screening:UTD Last diabetic foot exam:UTD  Cycle dispensing form sent to Lindsey Foltanski, CPP for review.   Lindsey Foltanski, CPP notified   , [facility] Health concierge  [phone] 

## 2022-03-27 ENCOUNTER — Telehealth: Payer: Self-pay

## 2022-03-27 NOTE — Chronic Care Management (AMB) (Addendum)
Forms complete for renewal of Basaglar through [employer] for year 2024. Sent for review.  [name], CPP notified  Avel Sensor, [place]  [phone]

## 2022-03-30 ENCOUNTER — Telehealth: Payer: Self-pay | Admitting: Family Medicine

## 2022-03-30 NOTE — Telephone Encounter (Signed)
No answer when calling to schedule AWV with [facility]. Will try patient again at a later time.

## 2022-04-03 NOTE — Telephone Encounter (Signed)
Control and instrumentation engineer forms and placed in front office. Patient signature and income information needed to complete renewal for 2024.

## 2022-04-03 NOTE — Telephone Encounter (Signed)
Contacted patient and she will go by [facility] with financial information and sign PAP forms for assistance with Nancee Liter, later this week     Avel Sensor, [place]  [phone]

## 2022-04-11 ENCOUNTER — Ambulatory Visit: Payer: Self-pay

## 2022-04-11 NOTE — Patient Outreach (Signed)
  Care Coordination   Follow Up Visit Note   04/11/2022 Name: Kristen [name] MRN: [id number] DOB: March 26, 1941  Kristen [name] is a 81 y.o. year old female who sees [name], MD for primary care. I spoke with  Kristen [name] by phone today.  What matters to the patients health and wellness today?  Ongoing management and control of diabetes.     Goals Addressed             This Visit's Progress    Patient stated:  " Managing my diabetes"       Care Coordination Interventions: Evaluation of current treatment plan related to Type 2 Diabetes and patient's adherence to plan as established by provider:  Patient states she is not watching what she eats as much due to the holiday's.  She states she plans to get back on track in January 2024.  Patient states she feels good overall.  Encouraged patient to continue to check blood sugars regularly and record: Patient reports she checks her blood sugars every once and a while.  She states they are up a little due to holiday indulgence.  She reports blood sugar range is 140-190's. Denies any blood sugars <70.  Reviewed medications with patient and discussed importance of compliance:  Patient denies any medication changes.  Reviewed scheduled/upcoming provider appointments:  Per chart review and discussion with patient next follow up visit with primary care provider will be her physical on 06/07/22. Discussed patients diet choices.  Advised to make conscientious decisions during the holiday.  Limit sweets and high carbohydrate foods.  Advised to not eat these food choices with every meal and limit he number of times per week. Advised to increase activity as tolerated.              SDOH assessments and interventions completed:  No     Care Coordination Interventions:  Yes, provided   Follow up plan: Follow up call scheduled for 06/09/22    Encounter Outcome:  Pt. Visit Completed   Quinn Plowman RN,BSN,CCM [place] [phone] direct line

## 2022-04-13 ENCOUNTER — Ambulatory Visit (INDEPENDENT_AMBULATORY_CARE_PROVIDER_SITE_OTHER): Payer: Medicare Other

## 2022-04-13 VITALS — Ht 61.75 in | Wt 192.0 lb

## 2022-04-13 DIAGNOSIS — Z0000 Encounter for general adult medical examination without abnormal findings: Secondary | ICD-10-CM

## 2022-04-13 NOTE — Progress Notes (Addendum)
Subjective:   Kristen [name] is a 81 y.o. female who presents for Medicare Annual (Subsequent) preventive examination.  Review of Systems    No ROS.  Medicare Wellness Virtual Visit.  Visual/audio telehealth visit, UTA vital signs.   See social history for additional risk factors.   Cardiac Risk Factors include: advanced age (>48mn, >>61women);hypertension;diabetes mellitus     Objective:    Today's Vitals   04/13/22 1303  Weight: 192 lb (87.1 kg)  Height: 5' 1.75" (1.568 m)   Body mass index is 35.4 kg/m.     04/13/2022    1:06 PM 04/05/2021   12:36 PM 04/08/2020    1:35 PM 03/31/2020   10:33 AM 03/14/2019    2:50 PM 03/12/2018    9:15 AM 10/31/2017   12:20 PM  Advanced Directives  Does Patient Have a Medical Advance Directive? _0  Yes Yes  Type of AParamedicof ASalmon CreekLiving will HCentralLiving will HVernon HillsLiving will HBoydLiving will HChowanLiving will HPragueLiving will HBeckett RidgeOut of facility DNR (pink MOST or yellow form);Living will  Does patient want to make changes to medical advance directive? No - Patient declined Yes (MAU/Ambulatory/Procedural Areas - Information given) No - Patient declined    No - Patient declined  Copy of HHerringsin Chart? Yes - validated most recent copy scanned in chart (See row information) Yes - validated most recent copy scanned in chart (See row information)  Yes - validated most recent copy scanned in chart (See row information) Yes - validated most recent copy scanned in chart (See row information) No - copy requested     Current Medications (verified) Outpatient Encounter Medications as of 04/13/2022  Medication Sig   amLODipine (NORVASC) 5 MG tablet TAKE ONE TABLET BY MOUTH ONCE DAILY   atorvastatin (LIPITOR) 40 MG tablet TAKE ONE TABLET  BY MOUTH ONCE DAILY   bismuth-metronidazole-tetracycline (PYLERA) 140-125-125 MG capsule Take 3 capsules by mouth 4 (four) times daily -  before meals and at bedtime.   Blood Glucose Monitoring Suppl (ONETOUCH VERIO) w/Device KIT Use to check blood sugar up to 4 times a day as directed   Cholecalciferol (VITAMIN D) 2000 units CAPS Take 2,000 Units by mouth daily.   clopidogrel (PLAVIX) 75 MG tablet TAKE ONE TABLET BY MOUTH EVERY MORNING   Colchicine (MITIGARE) 0.6 MG CAPS Take 1 tablet by mouth daily as needed (for gout).   COMFORT EZ PEN NEEDLES 31G X 5 MM MISC USE ONCE daily   glucose blood (ONETOUCH VERIO) test strip USE TO check blood glucose AS DIRECTED four times daily   hydrALAZINE (APRESOLINE) 50 MG tablet TAKE ONE TABLET BY MOUTH EVERY MORNING and TAKE ONE TABLET BY MOUTH EVERYDAY AT BEDTIME   hydrochlorothiazide (HYDRODIURIL) 12.5 MG tablet TAKE ONE TABLET BY MOUTH EVERY MORNING   Insulin Glargine (BASAGLAR KWIKPEN) 100 UNIT/ML Inject 18 Units into the skin at bedtime.   Lancets (ONETOUCH DELICA PLUS [id number] MISC USE TO check blood glucose four times daily AS DIRECTED   losartan (COZAAR) 100 MG tablet TAKE ONE TABLET BY MOUTH EVERY MORNING   metoprolol succinate (TOPROL-XL) 25 MG 24 hr tablet TAKE 1/2 TABLET BY MOUTH ONCE DAILY   nitroGLYCERIN (NITROSTAT) 0.4 MG SL tablet Place 0.4 mg under the tongue every 5 (five) minutes as needed for chest pain.   nystatin cream (MYCOSTATIN) Apply 1 application  topically 2 (two) times daily. (Patient taking differently: Apply 1 application  topically daily as needed for dry skin (rash).)   potassium chloride (KLOR-CON) 10 MEQ tablet TAKE ONE TABLET BY MOUTH ONCE WEEKLY ON MONDAY and TAKE ONE TABLET BY MOUTH ONCE WEEKLY ON WEDNESDAY and TAKE ONE TABLET BY MOUTH ONCE WEEKLY ON FRIDAY   tiZANidine (ZANAFLEX) 4 MG tablet Take 1 tablet (4 mg total) by mouth every 8 (eight) hours as needed for muscle spasms.   vitamin B-12 (CYANOCOBALAMIN) 1000 MCG  tablet Take 1 tablet (1,000 mcg total) by mouth daily.   Facility-Administered Encounter Medications as of 04/13/2022  Medication   0.9 %  sodium chloride infusion    Allergies (verified) Bee venom, Peanut-containing drug products, Aleve [naproxen sodium], Doxycycline, Metformin and related, and Penicillins   History: Past Medical History:  Diagnosis Date   Abdominal aortic atherosclerosis ([place]) by CT 02/2014   Acute kidney injury ([place]) 02/29/2016   During hospitalization 07/2017   CAD (coronary artery disease)    by CT, per pt h/o MI   Cataract    bilateral-removed   Clotting disorder ([place])    h/o PE   Diabetes type 2, uncontrolled    Frequent headaches    GERD (gastroesophageal reflux disease)    Gout    History of pulmonary embolism 2012   HLD (hyperlipidemia)    HTN (hypertension)    Internal capsule hemorrhage (H[facility])    hx of sublacunar infarct involving 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 report, states she was treated with medicine, [facility]     Osteoarthritis    knees   Primary localized osteoarthritis of left knee 09/22/2014   Risk for falls 09/28/2017   Sleep apnea 2011   study done in [place], states that since she lost weight she doesn't use the CPAP any longer & she doesn't have a problem with sleep apnea   Stroke [facility])    still has balance problem on occas. , uses cane but that's mainly for the left knee pain   Syncope 03/01/2016   Thoracic aortic atherosclerosis ([place]) 12/2015   by CXR   Vertigo    hx. benign postitional postural   Past Surgical History:  Procedure Laterality Date   CATARACT EXTRACTION Bilateral 04/25/2011   COLONOSCOPY  07/2020   done for positive iFOB - TA and TVA (large) consider rpt 3 yrs ([place])   ESOPHAGOGASTRODUODENOSCOPY  07/2020   H pylori positive (treated), dilation of upper esoph stricture   EYE SURGERY     /w IOL   FOOT SURGERY  Right    PARTIAL HYSTERECTOMY     for fibroids, ovaries remain   TONSILLECTOMY     TOTAL KNEE ARTHROPLASTY Right 1990s   TOTAL KNEE ARTHROPLASTY Left 09/22/2014   [name], MD   TUBAL LIGATION     Family History  Problem Relation Age of Onset   Cancer Mother        bone   Diabetes Father    Hypertension Father    Cancer Son 41       lung   Congenital heart disease Son 48   Stroke Brother    Colon cancer Neg Hx    Colon polyps Neg Hx    Esophageal cancer Neg Hx    Stomach cancer Neg Hx    Rectal cancer Neg Hx    Social History  Socioeconomic History   Marital status: Widowed    Spouse name: Not on file   Number of children: 5   Years of education: Not on file   Highest education level: Not on file  Occupational History   Occupation: retired  Tobacco Use   Smoking status: Never   Smokeless tobacco: Never   Tobacco comments:    tobacco use- no   Vaping Use   Vaping Use: Never used  Substance and Sexual Activity   Alcohol use: Not Currently   Drug use: No   Sexual activity: Not on file  Other Topics Concern   Not on file  Social History Narrative   Lives alone   4 grown children   Education: 11th grade   Occupation: retired, was Scientist, water quality   Activity: Does not regularly exercise.    Diet: good water, fruits/vegetables seldom   Transportation: Patient has been educated on her transportation benefit through [place] Strain: Low Risk  (04/13/2022)   Overall Financial Resource Strain (CARDIA)    Difficulty of Paying Living Expenses: Not hard at all  Food Insecurity: No Food Insecurity (04/13/2022)   Hunger Vital Sign    Worried About Running Out of Food in the Last Year: Never true    Ran Out of Food in the Last Year: Never true  Transportation Needs: No Transportation Needs (04/13/2022)   PRAPARE - Hydrologist (Medical): No    Lack of Transportation (Non-Medical):  No  Physical Activity: Inactive (04/05/2021)   Exercise Vital Sign    Days of Exercise per Week: 0 days    Minutes of Exercise per Session: 0 min  Stress: No Stress Concern Present (04/13/2022)   [place]    Feeling of Stress : Not at all  Social Connections: Moderately Isolated (04/13/2022)   Social Connection and Isolation Panel [NHANES]    Frequency of Communication with Friends and Family: More than three times a week    Frequency of Social Gatherings with Friends and Family: More than three times a week    Attends Religious Services: More than 4 times per year    Active Member of [employer] or Organizations: No    Attends Archivist Meetings: Never    Marital Status: Widowed    Tobacco Counseling Counseling given: Not Answered Tobacco comments: tobacco use- no    Clinical Intake:  Pre-visit preparation completed: Yes        Diabetes: Yes (Followed by PCP)  How often do you need to have someone help you when you read instructions, pamphlets, or other written materials from your doctor or pharmacy?: 1 - Never  Nutrition Risk Assessment: Has the patient had any N/V/D within the last 2 months?  No  Does the patient have any non-healing wounds?  No  Has the patient had any unintentional weight loss or weight gain?  No   Diabetes: Is the patient diabetic?  Yes  If diabetic, was a CBG obtained today?  No  Did the patient bring in their glucometer from home?  No  How often do you monitor your CBG's? Every once in awhile.   Financial Strains and Diabetes Management: Are you having any financial strains with the device, your supplies or your medication? No.  Does the patient want to be seen by Chronic Care Management for management of their diabetes?  No  Would the  patient like to be referred to a Nutritionist or for Diabetic Management?  No     Interpreter Needed?: No      Activities of Daily  Living    04/13/2022    1:16 PM  In your present state of health, do you have any difficulty performing the following activities:  Hearing? 0  Vision? 0  Difficulty concentrating or making decisions? 0  Walking or climbing stairs? 0  Comment Paces self with activity  Dressing or bathing? 0  Doing errands, shopping? 0  Preparing Food and eating ? N  Using the Toilet? N  In the past six months, have you accidently leaked urine? N  Do you have problems with loss of bowel control? N  Managing your Medications? N  Managing your Finances? N  Housekeeping or managing your Housekeeping? N    Patient Care Team: [name], MD as PCP - General (Family Medicine) Rockey Situ Kathlene November, MD as Consulting Physician (Cardiology) [place], Eppie Gibson, RN as [facility] [place], [place], [facility] as Pharmacist (Pharmacist) Dannielle Karvonen, RN as [place] any recent Medical Services you may have received from other than [facility] providers in the past year (date may be approximate).     Assessment:   This is a routine wellness examination for Ayden.  I connected with  Kristen [name] on 04/13/22 by a audio enabled telemedicine application and verified that I am speaking with the correct person using two identifiers.  Patient Location: Home  Provider Location: Office/Clinic  I discussed the limitations of evaluation and management by telemedicine. The patient expressed understanding and agreed to proceed.   Hearing/Vision screen Hearing Screening - Comments:: Patient is able to hear conversational tones without difficulty.  No issues reported.   Vision Screening - Comments:: Wears corrective lenses They have seen their ophthalmologist in the last 12 months.    Dietary issues and exercise activities discussed: Current Exercise Habits: Home exercise routine, Intensity: Mild Regular diet   Goals Addressed              This Visit's Progress    Maintain Healthy Lifestyle       Stay active Healthy diet Stay hydrated       Depression Screen    04/13/2022    1:04 PM 04/05/2021   12:39 PM 04/08/2020    1:35 PM 03/31/2020   10:37 AM 03/14/2019    2:51 PM 03/03/2019    4:37 PM 09/27/2018    4:12 PM  PHQ 2/9 Scores  PHQ - 2 Score 0 0 0 0 0 0 0  PHQ- 9 Score    0 0      Fall Risk    04/13/2022    1:16 PM 04/05/2021   12:37 PM 04/08/2020    1:35 PM 03/31/2020   10:35 AM 02/02/2020    2:12 PM  Fall Risk   Falls in the past year? 0 0 0 0 1  Number falls in past yr: 0 0 0 0 1  Comment   Denies recent falls    Injury with Fall?  0 0 0 0  Comment   N/A- no falls reported    Risk for fall due to :  No Fall Risks Medication side effect Medication side effect History of fall(s);Impaired balance/gait;Impaired mobility  Follow up Falls evaluation completed Falls prevention discussed Falls prevention discussed Falls evaluation completed;Falls prevention discussed Falls evaluation completed;Falls prevention discussed    FALL  RISK PREVENTION PERTAINING TO THE HOME: Home free of loose throw rugs in walkways, pet beds, electrical cords, etc? Yes  Adequate lighting in your home to reduce risk of falls? Yes   ASSISTIVE DEVICES UTILIZED TO PREVENT FALLS: Life alert? No  Use of a cane, walker or w/c? Yes, cane as needed only Grab bars in the bathroom? Yes  Shower chair or bench in shower? Yes  Comfort chair height toilet? Yes   TIMED UP AND GO: Was the test performed? No .   Cognitive Function:    03/31/2020   10:40 AM 03/14/2019    2:55 PM 03/12/2018    9:27 AM 08/09/2016   10:05 AM  MMSE - Mini Mental State Exam  Orientation to time _0 Orientation to Place _1 Registration _2 Attention/ Calculation 5 0 0 0  Recall _3 Language- name 2 objects   0 0  Language- repeat _4 Language- follow 3 step command   2 3  Language- follow 3 step command-comments   unable to  follow 1 step of 3 step command   Language- read & follow direction   0 0  Write a sentence   0 0  Copy design   0 0  Total score   19 20        04/13/2022    1:18 PM  6CIT Screen  What Year? 0 points  What month? 0 points  What time? 0 points  Count back from 20 0 points  Months in reverse 0 points  Repeat phrase 0 points  Total Score 0 points    Immunizations Immunization History  Administered Date(s) Administered   Fluad Quad(high Dose 65+) 03/30/2020   PFIZER Comirnaty(Gray Top)Covid-19 Tri-Sucrose Vaccine 06/22/2020, 07/13/2020   PPD Test 09/25/2014   Pneumococcal Conjugate-13 09/10/2013   Pneumococcal Polysaccharide-23 03/10/2015   Zoster Recombinat (Shingrix) 07/15/2021   Zoster, Live 04/21/2014   Screening Tests Health Maintenance  Topic Date Due   Zoster Vaccines- Shingrix (2 of 2) 04/21/2022 (Originally 09/09/2021)   COVID-19 Vaccine (3 - Pfizer risk series) 04/29/2022 (Originally 08/10/2020)   INFLUENZA VACCINE  07/23/2022 (Originally 11/22/2021)   DTaP/Tdap/Td (1 - Tdap) 08/10/2026 (Originally 08/07/1959)   OPHTHALMOLOGY EXAM  04/26/2022   Diabetic kidney evaluation - eGFR measurement  05/30/2022   Diabetic kidney evaluation - Urine ACR  05/30/2022   HEMOGLOBIN A1C  06/07/2022   FOOT EXAM  12/06/2022   Medicare Annual Wellness (AWV)  04/14/2023   Pneumonia Vaccine 72+ Years old  Completed   DEXA SCAN  Completed   HPV VACCINES  Aged Out   Health Maintenance There are no preventive care reminders to display for this patient.   Lung Cancer Screening: (Low Dose CT Chest recommended if Age 39-80 years, 30 pack-year currently smoking OR have quit w/in 15years.) does not qualify.   Hepatitis C Screening: does not qualify  Vision Screening: Recommended annual ophthalmology exams for early detection of glaucoma and other disorders of the eye.  Dental Screening: Recommended annual dental exams for proper oral hygiene  Community Resource Referral / Chronic Care  Management: CRR required this visit?  No   CCM required this visit?  No      Plan:     I have personally reviewed and noted the following in the patient's chart:   Medical and social history Use of alcohol, tobacco or illicit drugs  Current medications and  supplements including opioid prescriptions. Patient is not currently taking opioid prescriptions. Functional ability and status Nutritional status Physical activity Advanced directives List of other physicians Hospitalizations, surgeries, and ER visits in previous 12 months Vitals Screenings to include cognitive, depression, and falls Referrals and appointments  In addition, I have reviewed and discussed with patient certain preventive protocols, quality metrics, and best practice recommendations. A written personalized care plan for preventive services as well as general preventive health recommendations were provided to patient.     Leta Jungling, LPN   [id number]

## 2022-04-13 NOTE — Patient Instructions (Addendum)
Kristen [name] , Thank you for taking time to come for your Medicare Wellness Visit. I appreciate your ongoing commitment to your health goals. Please review the following plan we discussed and let me know if I can assist you in the future.   These are the goals we discussed:  Goals Addressed             This Visit's Progress    Maintain Healthy Lifestyle       Stay active Healthy diet Stay hydrated         This is a list of the screening recommended for you and due dates:  Health Maintenance  Topic Date Due   Zoster (Shingles) Vaccine (2 of 2) 04/21/2022*   COVID-19 Vaccine (3 - Pfizer risk series) 04/29/2022*   Flu Shot  07/23/2022*   DTaP/Tdap/Td vaccine (1 - Tdap) 08/10/2026*   Eye exam for diabetics  04/26/2022   Yearly kidney function blood test for diabetes  05/30/2022   Yearly kidney health urinalysis for diabetes  05/30/2022   Hemoglobin A1C  06/07/2022   Complete foot exam   12/06/2022   Medicare Annual Wellness Visit  04/14/2023   Pneumonia Vaccine  Completed   DEXA scan (bone density measurement)  Completed   HPV Vaccine  Aged Out  *Topic was postponed. The date shown is not the original due date.    Advanced directives: on file  Conditions/risks identified: none new.   Next appointment: Follow up in one year for your annual wellness visit    Preventive Care 65 Years and Older, Female Preventive care refers to lifestyle choices and visits with your health care provider that can promote health and wellness. What does preventive care include? A yearly physical exam. This is also called an annual well check. Dental exams once or twice a year. Routine eye exams. Ask your health care provider how often you should have your eyes checked. Personal lifestyle choices, including: Daily care of your teeth and gums. Regular physical activity. Eating a healthy diet. Avoiding tobacco and drug use. Limiting alcohol use. Practicing safe sex. Taking low-dose aspirin  every day. Taking vitamin and mineral supplements as recommended by your health care provider. What happens during an annual well check? The services and screenings done by your health care provider during your annual well check will depend on your age, overall health, lifestyle risk factors, and family history of disease. Counseling  Your health care provider may ask you questions about your: Alcohol use. Tobacco use. Drug use. Emotional well-being. Home and relationship well-being. Sexual activity. Eating habits. History of falls. Memory and ability to understand (cognition). Work and work Statistician. Reproductive health. Screening  You may have the following tests or measurements: Height, weight, and BMI. Blood pressure. Lipid and cholesterol levels. These may be checked every 5 years, or more frequently if you are over 80 years old. Skin check. Lung cancer screening. You may have this screening every year starting at age 80 if you have a 30-pack-year history of smoking and currently smoke or have quit within the past 15 years. Fecal occult blood test (FOBT) of the stool. You may have this test every year starting at age 80. Flexible sigmoidoscopy or colonoscopy. You may have a sigmoidoscopy every 5 years or a colonoscopy every 10 years starting at age 80. Hepatitis C blood test. Hepatitis B blood test. Sexually transmitted disease (STD) testing. Diabetes screening. This is done by checking your blood sugar (glucose) after you have not eaten for a  while (fasting). You may have this done every 1-3 years. Bone density scan. This is done to screen for osteoporosis. You may have this done starting at age 60. Mammogram. This may be done every 1-2 years. Talk to your health care provider about how often you should have regular mammograms. Talk with your health care provider about your test results, treatment options, and if necessary, the need for more tests. Vaccines  Your health  care provider may recommend certain vaccines, such as: Influenza vaccine. This is recommended every year. Tetanus, diphtheria, and acellular pertussis (Tdap, Td) vaccine. You may need a Td booster every 10 years. Zoster vaccine. You may need this after age 40. Pneumococcal 13-valent conjugate (PCV13) vaccine. One dose is recommended after age 42. Pneumococcal polysaccharide (PPSV23) vaccine. One dose is recommended after age 48. Talk to your health care provider about which screenings and vaccines you need and how often you need them. This information is not intended to replace advice given to you by your health care provider. Make sure you discuss any questions you have with your health care provider. Document Released: 05/07/2015 Document Revised: 12/29/2015 Document Reviewed: 02/09/2015 Elsevier Interactive Patient Education  2017 [name] Prevention in the Home Falls can cause injuries. They can happen to people of all ages. There are many things you can do to make your home safe and to help prevent falls. What can I do on the outside of my home? Regularly fix the edges of walkways and driveways and fix any cracks. Remove anything that might make you trip as you walk through a door, such as a raised step or threshold. Trim any bushes or trees on the path to your home. Use bright outdoor lighting. Clear any walking paths of anything that might make someone trip, such as rocks or tools. Regularly check to see if handrails are loose or broken. Make sure that both sides of any steps have handrails. Any raised decks and porches should have guardrails on the edges. Have any leaves, snow, or ice cleared regularly. Use sand or salt on walking paths during winter. Clean up any spills in your garage right away. This includes oil or grease spills. What can I do in the bathroom? Use night lights. Install grab bars by the toilet and in the tub and shower. Do not use towel bars as grab  bars. Use non-skid mats or decals in the tub or shower. If you need to sit down in the shower, use a plastic, non-slip stool. Keep the floor dry. Clean up any water that spills on the floor as soon as it happens. Remove soap buildup in the tub or shower regularly. Attach bath mats securely with double-sided non-slip rug tape. Do not have throw rugs and other things on the floor that can make you trip. What can I do in the bedroom? Use night lights. Make sure that you have a light by your bed that is easy to reach. Do not use any sheets or blankets that are too big for your bed. They should not hang down onto the floor. Have a firm chair that has side arms. You can use this for support while you get dressed. Do not have throw rugs and other things on the floor that can make you trip. What can I do in the kitchen? Clean up any spills right away. Avoid walking on wet floors. Keep items that you use a lot in easy-to-reach places. If you need to reach something above you,  use a strong step stool that has a grab bar. Keep electrical cords out of the way. Do not use floor polish or wax that makes floors slippery. If you must use wax, use non-skid floor wax. Do not have throw rugs and other things on the floor that can make you trip. What can I do with my stairs? Do not leave any items on the stairs. Make sure that there are handrails on both sides of the stairs and use them. Fix handrails that are broken or loose. Make sure that handrails are as long as the stairways. Check any carpeting to make sure that it is firmly attached to the stairs. Fix any carpet that is loose or worn. Avoid having throw rugs at the top or bottom of the stairs. If you do have throw rugs, attach them to the floor with carpet tape. Make sure that you have a light switch at the top of the stairs and the bottom of the stairs. If you do not have them, ask someone to add them for you. What else can I do to help prevent  falls? Wear shoes that: Do not have high heels. Have rubber bottoms. Are comfortable and fit you well. Are closed at the toe. Do not wear sandals. If you use a stepladder: Make sure that it is fully opened. Do not climb a closed stepladder. Make sure that both sides of the stepladder are locked into place. Ask someone to hold it for you, if possible. Clearly mark and make sure that you can see: Any grab bars or handrails. First and last steps. Where the edge of each step is. Use tools that help you move around (mobility aids) if they are needed. These include: Canes. Walkers. Scooters. Crutches. Turn on the lights when you go into a dark area. Replace any light bulbs as soon as they burn out. Set up your furniture so you have a clear path. Avoid moving your furniture around. If any of your floors are uneven, fix them. If there are any pets around you, be aware of where they are. Review your medicines with your doctor. Some medicines can make you feel dizzy. This can increase your chance of falling. Ask your doctor what other things that you can do to help prevent falls. This information is not intended to replace advice given to you by your health care provider. Make sure you discuss any questions you have with your health care provider. Document Released: 02/04/2009 Document Revised: 09/16/2015 Document Reviewed: 05/15/2014 Elsevier Interactive Patient Education  2017 [employer].

## 2022-05-22 ENCOUNTER — Other Ambulatory Visit: Payer: Self-pay | Admitting: Family Medicine

## 2022-05-22 DIAGNOSIS — Z1231 Encounter for screening mammogram for malignant neoplasm of breast: Secondary | ICD-10-CM

## 2022-05-24 ENCOUNTER — Other Ambulatory Visit: Payer: Self-pay | Admitting: Family Medicine

## 2022-05-24 DIAGNOSIS — E1169 Type 2 diabetes mellitus with other specified complication: Secondary | ICD-10-CM

## 2022-05-24 DIAGNOSIS — E1165 Type 2 diabetes mellitus with hyperglycemia: Secondary | ICD-10-CM

## 2022-05-24 DIAGNOSIS — I1A0 Resistant hypertension: Secondary | ICD-10-CM

## 2022-05-25 ENCOUNTER — Ambulatory Visit
Admission: RE | Admit: 2022-05-25 | Discharge: 2022-05-25 | Disposition: A | Discharge status: Home / Self Care | Payer: Medicare Other | Source: Ambulatory Visit | Attending: Family Medicine | Admitting: Family Medicine

## 2022-05-25 DIAGNOSIS — Z1231 Encounter for screening mammogram for malignant neoplasm of breast: Secondary | ICD-10-CM | POA: Diagnosis not present

## 2022-05-26 ENCOUNTER — Telehealth: Payer: Self-pay

## 2022-05-26 NOTE — Progress Notes (Unsigned)
Care Management & Coordination Services Pharmacy Team  Reason for Encounter: Medication coordination and delivery  Contacted patient to discuss medications and coordinate delivery from [facility]. {US HC Outreach:28874} Cycle dispensing form sent to *** for review.   Last adherence delivery date:03/09/22      Patient is due for next adherence delivery on: 06/06/22  This delivery to include: Adherence Packaging  90 Days  Atorvastatin 40 mg 1 tablet daily (breakfast) Metoprolol Succinate 25 mg take 1/2  tablet daily (breakfast)  Hydralazine 50 mg 1 tablet twice daily (1-breakfast, 1-bedtime) Clopidogrel 75 mg 1 tablet daily (breakfast) Potassium chloride ER 10 Meq 1 tablet daily on Mon, Wed, Friday (breakfast) Amlodipine 5 mg 1 tablet daily (breakfast) Losartan 100 mg 1 tablet daily (breakfast Hydrochlorothiazide 12.5 mg 1 tablet daily (breakfast)  VIAL medications: Pen needles use as directed once daily at bedtime One touch delica plus lancets 16W  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 {yes/no:20286}  How often do you forget or accidentally miss a dose? {Missed doses:25554}  Do you use a pillbox? {yes/no:20286}  Is patient in packaging {yes/no:20286}  If yes  What is the date on your next pill pack?  Any concerns or issues with your packaging?   Recent blood pressure readings are as follows:***  Recent blood glucose readings are as follows:***   Chart review: Recent office visits:  None since last contact  Recent consult visits:  None since last contact  Hospital visits:  None in previous 6 months  Medications: Outpatient Encounter Medications as of 05/26/2022  Medication Sig   amLODipine (NORVASC) 5 MG tablet TAKE ONE TABLET BY MOUTH  ONCE DAILY   atorvastatin (LIPITOR) 40 MG tablet TAKE ONE TABLET BY MOUTH ONCE DAILY   bismuth-metronidazole-tetracycline (PYLERA) 140-125-125 MG capsule Take 3 capsules by mouth 4 (four) times daily -  before meals and at bedtime.   Blood Glucose Monitoring Suppl (ONETOUCH VERIO) w/Device KIT Use to check blood sugar up to 4 times a day as directed   Cholecalciferol (VITAMIN D) 2000 units CAPS Take 2,000 Units by mouth daily.   clopidogrel (PLAVIX) 75 MG tablet TAKE ONE TABLET BY MOUTH EVERY MORNING   Colchicine (MITIGARE) 0.6 MG CAPS Take 1 tablet by mouth daily as needed (for gout).   COMFORT EZ PEN NEEDLES 31G X 5 MM MISC USE ONCE daily   glucose blood (ONETOUCH VERIO) test strip USE TO check blood glucose AS DIRECTED four times daily   hydrALAZINE (APRESOLINE) 50 MG tablet TAKE ONE TABLET BY MOUTH EVERY MORNING and TAKE ONE TABLET BY MOUTH EVERYDAY AT BEDTIME   hydrochlorothiazide (HYDRODIURIL) 12.5 MG tablet TAKE ONE TABLET BY MOUTH EVERY MORNING   Insulin Glargine (BASAGLAR KWIKPEN) 100 UNIT/ML Inject 18 Units into the skin at bedtime.   Lancets (ONETOUCH DELICA PLUS [id number]) MISC Use as instructed to check blood sugar once a day   losartan (COZAAR) 100 MG tablet TAKE ONE TABLET BY MOUTH EVERY MORNING   metoprolol succinate (TOPROL-XL) 25 MG 24 hr tablet TAKE 1/2 TABLET BY MOUTH ONCE DAILY   nitroGLYCERIN (NITROSTAT) 0.4 MG SL tablet Place 0.4 mg under the tongue every 5 (five) minutes as needed for chest pain.   nystatin cream (MYCOSTATIN) Apply 1 application topically 2 (two) times daily. (Patient taking differently: Apply 1 application  topically daily as needed for dry skin (rash).)  potassium chloride (KLOR-CON) 10 MEQ tablet TAKE ONE TABLET BY MOUTH ONCE WEEKLY ON MONDAY and TAKE ONE TABLET BY MOUTH ONCE WEEKLY ON WEDNESDAY and TAKE ONE TABLET BY MOUTH ONCE WEEKLY ON FRIDAY   tiZANidine (ZANAFLEX) 4 MG tablet Take 1 tablet (4 mg total) by mouth every 8 (eight) hours as needed for  muscle spasms.   vitamin B-12 (CYANOCOBALAMIN) 1000 MCG tablet Take 1 tablet (1,000 mcg total) by mouth daily.   Facility-Administered Encounter Medications as of 05/26/2022  Medication   0.9 %  sodium chloride infusion   BP Readings from Last 3 Encounters:  01/20/22 (!) 148/96  12/05/21 134/80  05/31/21 (!) 187/70    Pulse Readings from Last 3 Encounters:  01/20/22 78  12/05/21 64  05/31/21 60    Lab Results  Component Value Date/Time   HGBA1C 8.4 (A) 12/05/2021 10:28 AM   HGBA1C 9.2 (H) 05/30/2021 10:46 AM   HGBA1C 8.0 (A) 01/05/2021 11:15 AM   HGBA1C 10.7 (H) 03/16/2020 05:37 AM   Lab Results  Component Value Date   CREATININE 1.06 (H) 05/30/2021   BUN 22 05/30/2021   GFR 59.88 (L) 05/30/2021   GFRNONAA 53 (L) 05/30/2021   GFRAA >60 02/20/2018   NA 139 05/30/2021   K 3.4 (L) 05/30/2021   CALCIUM 9.5 05/30/2021   CO2 27 05/30/2021     [name], PharmD notified  Avel Sensor, [place] Assistant [phone]

## 2022-05-29 NOTE — Progress Notes (Deleted)
Patient ID: Kristen [name], female   DOB: Mar 18, 1941, 82 y.o.   MRN: [id number]

## 2022-06-07 ENCOUNTER — Encounter: Payer: Self-pay | Admitting: Family Medicine

## 2022-06-07 ENCOUNTER — Ambulatory Visit (INDEPENDENT_AMBULATORY_CARE_PROVIDER_SITE_OTHER): Payer: Medicare Other | Admitting: Family Medicine

## 2022-06-07 VITALS — BP 154/84 | HR 75 | Temp 97.3°F | Ht 61.25 in | Wt 187.2 lb

## 2022-06-07 DIAGNOSIS — Z79899 Other long term (current) drug therapy: Secondary | ICD-10-CM | POA: Diagnosis not present

## 2022-06-07 DIAGNOSIS — I700 Atherosclerosis of aorta: Secondary | ICD-10-CM | POA: Diagnosis not present

## 2022-06-07 DIAGNOSIS — Z8673 Personal history of transient ischemic attack (TIA), and cerebral infarction without residual deficits: Secondary | ICD-10-CM | POA: Diagnosis not present

## 2022-06-07 DIAGNOSIS — E1169 Type 2 diabetes mellitus with other specified complication: Secondary | ICD-10-CM | POA: Diagnosis not present

## 2022-06-07 DIAGNOSIS — E1165 Type 2 diabetes mellitus with hyperglycemia: Secondary | ICD-10-CM

## 2022-06-07 DIAGNOSIS — Z7189 Other specified counseling: Secondary | ICD-10-CM

## 2022-06-07 DIAGNOSIS — R3915 Urgency of urination: Secondary | ICD-10-CM

## 2022-06-07 DIAGNOSIS — K862 Cyst of pancreas: Secondary | ICD-10-CM

## 2022-06-07 DIAGNOSIS — E039 Hypothyroidism, unspecified: Secondary | ICD-10-CM | POA: Diagnosis not present

## 2022-06-07 DIAGNOSIS — Z794 Long term (current) use of insulin: Secondary | ICD-10-CM

## 2022-06-07 DIAGNOSIS — E559 Vitamin D deficiency, unspecified: Secondary | ICD-10-CM | POA: Diagnosis not present

## 2022-06-07 DIAGNOSIS — M1A09X0 Idiopathic chronic gout, multiple sites, without tophus (tophi): Secondary | ICD-10-CM

## 2022-06-07 DIAGNOSIS — Z0001 Encounter for general adult medical examination with abnormal findings: Secondary | ICD-10-CM

## 2022-06-07 DIAGNOSIS — I1A0 Resistant hypertension: Secondary | ICD-10-CM | POA: Diagnosis not present

## 2022-06-07 DIAGNOSIS — E785 Hyperlipidemia, unspecified: Secondary | ICD-10-CM | POA: Diagnosis not present

## 2022-06-07 DIAGNOSIS — E538 Deficiency of other specified B group vitamins: Secondary | ICD-10-CM

## 2022-06-07 DIAGNOSIS — N1831 Chronic kidney disease, stage 3a: Secondary | ICD-10-CM | POA: Diagnosis not present

## 2022-06-07 DIAGNOSIS — E1159 Type 2 diabetes mellitus with other circulatory complications: Secondary | ICD-10-CM

## 2022-06-07 DIAGNOSIS — E113392 Type 2 diabetes mellitus with moderate nonproliferative diabetic retinopathy without macular edema, left eye: Secondary | ICD-10-CM

## 2022-06-07 DIAGNOSIS — D649 Anemia, unspecified: Secondary | ICD-10-CM

## 2022-06-07 LAB — COMPREHENSIVE METABOLIC PANEL
ALT: 13 U/L (ref 0–35)
AST: 15 U/L (ref 0–37)
Albumin: 3.8 g/dL (ref 3.5–5.2)
Alkaline Phosphatase: 101 U/L (ref 39–117)
BUN: 17 mg/dL (ref 6–23)
CO2: 31 mEq/L (ref 19–32)
Calcium: 9.6 mg/dL (ref 8.4–10.5)
Chloride: 102 mEq/L (ref 96–112)
Creatinine, Ser: 1.14 mg/dL (ref 0.40–1.20)
GFR: 45.36 mL/min — ABNORMAL LOW (ref >60.00)
Glucose, Bld: 253 mg/dL — ABNORMAL HIGH (ref 70–99)
Potassium: 4.1 mEq/L (ref 3.5–5.1)
Sodium: 140 mEq/L (ref 135–145)
Total Bilirubin: 0.7 mg/dL (ref 0.2–1.2)
Total Protein: 6.9 g/dL (ref 6.0–8.3)

## 2022-06-07 LAB — URIC ACID: Uric Acid, Serum: 6.4 mg/dL (ref 2.4–7.0)

## 2022-06-07 LAB — LIPID PANEL
Cholesterol: 146 mg/dL (ref 0–200)
HDL: 42.4 mg/dL (ref >39.00)
LDL Cholesterol: 68 mg/dL (ref 0–99)
NonHDL: 103.48
Total CHOL/HDL Ratio: 3
Triglycerides: 177 mg/dL — ABNORMAL HIGH (ref 0.0–149.0)
VLDL: 35.4 mg/dL (ref 0.0–40.0)

## 2022-06-07 LAB — T4, FREE: Free T4: 0.96 ng/dL (ref 0.60–1.60)

## 2022-06-07 LAB — TSH: TSH: 4.48 u[IU]/mL (ref 0.35–5.50)

## 2022-06-07 LAB — POC URINALSYSI DIPSTICK (AUTOMATED)
Bilirubin, UA: NEGATIVE
Glucose, UA: POSITIVE — AB
Ketones, UA: NEGATIVE
Leukocytes, UA: NEGATIVE
Nitrite, UA: NEGATIVE
Protein, UA: POSITIVE — AB
Spec Grav, UA: 1.025 (ref 1.010–1.025)
Urobilinogen, UA: 0.2 E.U./dL
pH, UA: 5.5 (ref 5.0–8.0)

## 2022-06-07 LAB — CBC WITH DIFFERENTIAL/PLATELET
Basophils Absolute: 0 10*3/uL (ref 0.0–0.1)
Basophils Relative: 0.5 % (ref 0.0–3.0)
Eosinophils Absolute: 0 10*3/uL (ref 0.0–0.7)
Eosinophils Relative: 0.3 % (ref 0.0–5.0)
HCT: 35.3 % — ABNORMAL LOW (ref 36.0–46.0)
Hemoglobin: 11.3 g/dL — ABNORMAL LOW (ref 12.0–15.0)
Lymphocytes Relative: 25.7 % (ref 12.0–46.0)
Lymphs Abs: 1.9 10*3/uL (ref 0.7–4.0)
MCHC: 32.1 g/dL (ref 30.0–36.0)
MCV: 87.2 fl (ref 78.0–100.0)
Monocytes Absolute: 0.3 10*3/uL (ref 0.1–1.0)
Monocytes Relative: 4.7 % (ref 3.0–12.0)
Neutro Abs: 5 10*3/uL (ref 1.4–7.7)
Neutrophils Relative %: 68.8 % (ref 43.0–77.0)
Platelets: 243 10*3/uL (ref 150.0–400.0)
RBC: 4.05 Mil/uL (ref 3.87–5.11)
RDW: 15.5 % (ref 11.5–15.5)
WBC: 7.3 10*3/uL (ref 4.0–10.5)

## 2022-06-07 LAB — VITAMIN D 25 HYDROXY (VIT D DEFICIENCY, FRACTURES): VITD: 41.08 ng/mL (ref 30.00–100.00)

## 2022-06-07 LAB — VITAMIN B12: Vitamin B-12: 354 pg/mL (ref 211–911)

## 2022-06-07 LAB — PHOSPHORUS: Phosphorus: 3.8 mg/dL (ref 2.3–4.6)

## 2022-06-07 LAB — HEMOGLOBIN A1C: Hgb A1c MFr Bld: 9.7 % — ABNORMAL HIGH (ref 4.6–6.5)

## 2022-06-07 MED ORDER — LOSARTAN POTASSIUM 100 MG PO TABS
100.0000 mg | ORAL_TABLET | Freq: Every morning | ORAL | 4 refills | Status: DC
Start: 2022-06-07 — End: 2022-12-20

## 2022-06-07 MED ORDER — POTASSIUM CHLORIDE ER 10 MEQ PO TBCR: 10.0000 meq | EXTENDED_RELEASE_TABLET | ORAL | 4 refills | Status: DC

## 2022-06-07 MED ORDER — CLOPIDOGREL BISULFATE 75 MG PO TABS
75.0000 mg | ORAL_TABLET | Freq: Every morning | ORAL | 4 refills | Status: DC
Start: 2022-06-07 — End: 2022-12-20

## 2022-06-07 MED ORDER — METOPROLOL SUCCINATE ER 25 MG PO TB24: 12.5000 mg | ORAL_TABLET | Freq: Every day | ORAL | 4 refills | Status: DC

## 2022-06-07 MED ORDER — AMLODIPINE BESYLATE 5 MG PO TABS: 5.0000 mg | ORAL_TABLET | Freq: Every day | ORAL | 4 refills | Status: DC

## 2022-06-07 MED ORDER — BASAGLAR KWIKPEN 100 UNIT/ML ~~LOC~~ SOPN: 18.0000 [IU] | PEN_INJECTOR | Freq: Every day | SUBCUTANEOUS | 4 refills | Status: DC

## 2022-06-07 MED ORDER — HYDRALAZINE HCL 50 MG PO TABS: 50.0000 mg | ORAL_TABLET | Freq: Two times a day (BID) | ORAL | 4 refills | Status: DC

## 2022-06-07 MED ORDER — HYDROCHLOROTHIAZIDE 12.5 MG PO TABS
12.5000 mg | ORAL_TABLET | Freq: Every morning | ORAL | 4 refills | Status: DC
Start: 2022-06-07 — End: 2022-12-20

## 2022-06-07 MED ORDER — ATORVASTATIN CALCIUM 40 MG PO TABS: 40.0000 mg | ORAL_TABLET | Freq: Every day | ORAL | 4 refills | Status: DC

## 2022-06-07 NOTE — Assessment & Plan Note (Signed)
Preventative protocols reviewed and updated unless pt declined. Discussed healthy diet and lifestyle.  

## 2022-06-07 NOTE — Patient Instructions (Addendum)
Labwork today  Check with [facility] about dates of shingles shots and let us know.  Schedule diabetic eye exam.  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  Blood pressure is staying high - make sure you're taking 5 blood pressure medicines daily. Work on low salt/sodium diet, drink plenty of water, return in 2-3 months for blood pressure follow up visit.

## 2022-06-07 NOTE — Assessment & Plan Note (Signed)
Previously discussed.

## 2022-06-07 NOTE — Progress Notes (Signed)
Patient ID: Kristen [name], female    DOB: August 06, 1940, 82 y.o.   MRN: [id number]  This visit was conducted in person.  BP (!) 154/84   Pulse 75   Temp (!) 97.3 F (36.3 C) (Temporal)   Ht 5' 1.25" (1.556 m)   Wt 187 lb 4 oz (84.9 kg)   SpO2 97%   BMI 35.09 kg/m   Elevated on repeat testing 170/80  CC: CPE Subjective:   HPI: Kristen [name] is a 82 y.o. female presenting on 06/07/2022 for Annual Exam (MCR prt 2 [AWV- 04/13/22]. Also, c/o urinary urgency. Sxs started about 3 mos, worsening. Denies any other sxs. )   Saw health advisor 03/2022 for medicare wellness visit. Note reviewed.   No results found.  [facility] Visit from 06/07/2022 in [facility] [place]  PHQ-2 Total Score 0          06/07/2022   10:38 AM 04/13/2022    1:16 PM 04/05/2021   12:37 PM 04/08/2020    1:35 PM 03/31/2020   10:35 AM  Fall Risk   Falls in the past year? 0 0 0 0 0  Number falls in past yr:  0 0 0 0  Comment    Denies recent falls   Injury with Fall?   0 0 0  Comment    N/A- no falls reported   Risk for fall due to :   No Fall Risks Medication side effect Medication side effect  Follow up  Falls evaluation completed Falls prevention discussed Falls prevention discussed Falls evaluation completed;Falls prevention discussed   DM - continues only basaglar 18u daily  Lab Results  Component Value Date   HGBA1C 9.7 (H) 06/07/2022    Notes some incomplete emptying present over the past 3 months, but worse this past month. No dysuria, abd pain, hematuria.   DM - continues basaglar 18u daily. Sugars 130-140 fasting and 170-180 in evenings.   She stopped b12 replacement - didn't fell well on this supplement  Seen at [facility] ER for abd pain 05/2021 - contrasted CT abd/pelvis showed possible early cirrhosis and 1cm thin walled cystic lesion to anterior pancreatic head - rec pancreas MRI.   Preventative: Colonoscopy 07/2020 done for positive iFOB - TA and TVA  (large) consider rpt 3 yrs ([place])  EGD 07/2020 - H pylori positive (treated), dilation of upper esoph stricture Well woman - s/p hysterectomy for fibroids, ovaries remain.  [facility]22 [facility] - requests to stop screening.  DEXA WNL 03/2015  Lung cancer screening - not eligible  Flu shot - declines COVID vaccine - 06/2020 x2, no booster Prevnar-13 08/2013, pneumovax 2016  Zostavax - 03/2014  Shingrix - 06/2021, [facility] Advanced directive: scanned 11/20219 - sons Jaquelyn Bitter and [name] are [facility]. Does not want prolonged life support if terminal condition. MOST form reviewed and filled out 10/2017 as well.  Seat belt use discussed  Sunscreen use discussed. No changing moles on skin.  Non smoker  Alcohol - rare  Dentist - has dentures Eye exam - yearly  Bowel - no constipation  Bladder - no incontinence  Lives alone   4 grown children  Education: 11th grade Occupation: retired, was Scientist, water quality   Activity: no regular walking.  Diet: good water, fruits/vegetables seldom      Relevant past medical, surgical, family and social history reviewed and updated as indicated. Interim medical history since our last visit  reviewed. Allergies and medications reviewed and updated. Outpatient Medications Prior to Visit  Medication Sig Dispense Refill   bismuth-metronidazole-tetracycline (PYLERA) 140-125-125 MG capsule Take 3 capsules by mouth 4 (four) times daily -  before meals and at bedtime. 120 capsule 0   Blood Glucose Monitoring Suppl (ONETOUCH VERIO) w/Device KIT Use to check blood sugar up to 4 times a day as directed 1 kit 0   Cholecalciferol (VITAMIN D) 2000 units CAPS Take 2,000 Units by mouth daily.     Colchicine (MITIGARE) 0.6 MG CAPS Take 1 tablet by mouth daily as needed (for gout). 30 capsule 3   COMFORT EZ PEN NEEDLES 31G X 5 MM MISC USE ONCE daily 100 each 3   glucose blood (ONETOUCH VERIO) test strip USE TO check blood glucose AS DIRECTED four  times daily 100 strip 3   Lancets (ONETOUCH DELICA PLUS 123XX123) MISC Use as instructed to check blood sugar once a day 100 each 4   nitroGLYCERIN (NITROSTAT) 0.4 MG SL tablet Place 0.4 mg under the tongue every 5 (five) minutes as needed for chest pain.     nystatin cream (MYCOSTATIN) Apply 1 application topically 2 (two) times daily. (Patient taking differently: Apply 1 application  topically daily as needed for dry skin (rash).) 30 g 0   amLODipine (NORVASC) 5 MG tablet TAKE ONE TABLET BY MOUTH ONCE DAILY 90 tablet 0   atorvastatin (LIPITOR) 40 MG tablet TAKE ONE TABLET BY MOUTH ONCE DAILY 90 tablet 0   clopidogrel (PLAVIX) 75 MG tablet TAKE ONE TABLET BY MOUTH EVERY MORNING 90 tablet 1   hydrALAZINE (APRESOLINE) 50 MG tablet TAKE ONE TABLET BY MOUTH EVERY MORNING and TAKE ONE TABLET BY MOUTH EVERYDAY AT BEDTIME 180 tablet 3   hydrochlorothiazide (HYDRODIURIL) 12.5 MG tablet TAKE ONE TABLET BY MOUTH EVERY MORNING 90 tablet 3   Insulin Glargine (BASAGLAR KWIKPEN) 100 UNIT/ML Inject 18 Units into the skin at bedtime.     losartan (COZAAR) 100 MG tablet TAKE ONE TABLET BY MOUTH EVERY MORNING 90 tablet 3   metoprolol succinate (TOPROL-XL) 25 MG 24 hr tablet TAKE 1/2 TABLET BY MOUTH ONCE DAILY 45 tablet 0   potassium chloride (KLOR-CON) 10 MEQ tablet TAKE ONE TABLET BY MOUTH ONCE WEEKLY ON MONDAY and TAKE ONE TABLET BY MOUTH ONCE WEEKLY ON WEDNESDAY and TAKE ONE TABLET BY MOUTH ONCE WEEKLY ON FRIDAY 40 tablet 0   tiZANidine (ZANAFLEX) 4 MG tablet Take 1 tablet (4 mg total) by mouth every 8 (eight) hours as needed for muscle spasms. 15 tablet 0   vitamin B-12 (CYANOCOBALAMIN) 1000 MCG tablet Take 1 tablet (1,000 mcg total) by mouth daily.     0.9 %  sodium chloride infusion      No facility-administered medications prior to visit.     Per HPI unless specifically indicated in ROS section below Review of Systems  Constitutional:  Negative for activity change, appetite change, chills, fatigue,  fever and unexpected weight change.  HENT:  Negative for hearing loss.   Eyes:  Negative for visual disturbance.  Respiratory:  Negative for cough, chest tightness, shortness of breath and wheezing.   Cardiovascular:  Negative for chest pain, palpitations and leg swelling.  Gastrointestinal:  Negative for abdominal distention, abdominal pain, blood in stool, constipation, diarrhea, nausea and vomiting.  Genitourinary:  Negative for difficulty urinating and hematuria.  Musculoskeletal:  Negative for arthralgias, myalgias and neck pain.  Skin:  Negative for rash.  Neurological:  Negative for dizziness, seizures, syncope and headaches.  Hematological:  Negative for adenopathy. Does not bruise/bleed easily.  Psychiatric/Behavioral:  Negative for dysphoric mood. The patient is not nervous/anxious.     Objective:  BP (!) 154/84   Pulse 75   Temp (!) 97.3 F (36.3 C) (Temporal)   Ht 5' 1.25" (1.556 m)   Wt 187 lb 4 oz (84.9 kg)   SpO2 97%   BMI 35.09 kg/m   Wt Readings from Last 3 Encounters:  06/07/22 187 lb 4 oz (84.9 kg)  04/13/22 192 lb (87.1 kg)  01/20/22 192 lb (87.1 kg)      Physical Exam Vitals and nursing note reviewed.  Constitutional:      Appearance: Normal appearance. She is not ill-appearing.  HENT:     Head: Normocephalic and atraumatic.     Right Ear: Tympanic membrane, ear canal and external ear normal. There is no impacted cerumen.     Left Ear: Tympanic membrane, ear canal and external ear normal. There is no impacted cerumen.  Eyes:     General:        Right eye: No discharge.        Left eye: No discharge.     Extraocular Movements: Extraocular movements intact.     Conjunctiva/sclera: Conjunctivae normal.     Pupils: Pupils are equal, round, and reactive to light.  Neck:     Thyroid: No thyroid mass or thyromegaly.  Cardiovascular:     Rate and Rhythm: Normal rate and regular rhythm.     Pulses: Normal pulses.     Heart sounds: Normal heart sounds. No  murmur heard. Pulmonary:     Effort: Pulmonary effort is normal. No respiratory distress.     Breath sounds: Normal breath sounds. No wheezing, rhonchi or rales.  Abdominal:     General: Bowel sounds are normal. There is no distension.     Palpations: Abdomen is soft. There is no mass.     Tenderness: There is no abdominal tenderness. There is no guarding or rebound.     Hernia: No hernia is present.  Musculoskeletal:     Cervical back: Normal range of motion and neck supple. No rigidity.     Right lower leg: No edema.     Left lower leg: No edema.  Lymphadenopathy:     Cervical: No cervical adenopathy.  Skin:    General: Skin is warm and dry.     Findings: No rash.  Neurological:     General: No focal deficit present.     Mental Status: She is alert. Mental status is at baseline.  Psychiatric:        Mood and Affect: Mood normal.        Behavior: Behavior normal.       Results for orders placed or performed in visit on 06/07/22  Hemoglobin A1c  Result Value Ref Range   Hgb A1c MFr Bld 9.7 (H) 4.6 - 6.5 %  Lipid panel  Result Value Ref Range   Cholesterol 146 0 - 200 mg/dL   Triglycerides 177.0 (H) 0.0 - 149.0 mg/dL   HDL 42.40 >39.00 mg/dL   VLDL 35.4 0.0 - 40.0 mg/dL   LDL Cholesterol 68 0 - 99 mg/dL   Total CHOL/HDL Ratio 3    NonHDL 103.48   Comprehensive metabolic panel  Result Value Ref Range   Sodium 140 135 - 145 mEq/L   Potassium 4.1 3.5 - 5.1 mEq/L   Chloride 102 96 - 112 mEq/L   CO2 31 19 -  32 mEq/L   Glucose, Bld 253 (H) 70 - 99 mg/dL   BUN 17 6 - 23 mg/dL   Creatinine, Ser 1.14 0.40 - 1.20 mg/dL   Total Bilirubin 0.7 0.2 - 1.2 mg/dL   Alkaline Phosphatase 101 39 - 117 U/L   AST 15 0 - 37 U/L   ALT 13 0 - 35 U/L   Total Protein 6.9 6.0 - 8.3 g/dL   Albumin 3.8 3.5 - 5.2 g/dL   GFR 45.36 (L) >60.00 mL/min   Calcium 9.6 8.4 - 10.5 mg/dL  Phosphorus  Result Value Ref Range   Phosphorus 3.8 2.3 - 4.6 mg/dL  VITAMIN D 25 Hydroxy (Vit-D Deficiency,  Fractures)  Result Value Ref Range   VITD 41.08 30.00 - 100.00 ng/mL  Parathyroid hormone, intact (no Ca)  Result Value Ref Range   PTH 52 16 - 77 pg/mL  CBC with Differential/Platelet  Result Value Ref Range   WBC 7.3 4.0 - 10.5 K/uL   RBC 4.05 3.87 - 5.11 Mil/uL   Hemoglobin 11.3 (L) 12.0 - 15.0 g/dL   HCT 35.3 (L) 36.0 - 46.0 %   MCV 87.2 78.0 - 100.0 fl   MCHC 32.1 30.0 - 36.0 g/dL   RDW 15.5 11.5 - 15.5 %   Platelets 243.0 150.0 - 400.0 K/uL   Neutrophils Relative % 68.8 43.0 - 77.0 %   Lymphocytes Relative 25.7 12.0 - 46.0 %   Monocytes Relative 4.7 3.0 - 12.0 %   Eosinophils Relative 0.3 0.0 - 5.0 %   Basophils Relative 0.5 0.0 - 3.0 %   Neutro Abs 5.0 1.4 - 7.7 K/uL   Lymphs Abs 1.9 0.7 - 4.0 K/uL   Monocytes Absolute 0.3 0.1 - 1.0 K/uL   Eosinophils Absolute 0.0 0.0 - 0.7 K/uL   Basophils Absolute 0.0 0.0 - 0.1 K/uL  Vitamin B12  Result Value Ref Range   Vitamin B-12 354 211 - 911 pg/mL  Uric acid  Result Value Ref Range   Uric Acid, Serum 6.4 2.4 - 7.0 mg/dL  TSH  Result Value Ref Range   TSH 4.48 0.35 - 5.50 uIU/mL  T4, free  Result Value Ref Range   Free T4 0.96 0.60 - 1.60 ng/dL  POCT Urinalysis Dipstick (Automated)  Result Value Ref Range   Color, UA yellow    Clarity, UA clear    Glucose, UA Positive (A) Negative   Bilirubin, UA negative    Ketones, UA negative    Spec Grav, UA 1.025 1.010 - 1.025   Blood, UA 1+    pH, UA 5.5 5.0 - 8.0   Protein, UA Positive (A) Negative   Urobilinogen, UA 0.2 0.2 or 1.0 E.U./dL   Nitrite, UA negative    Leukocytes, UA Negative Negative    Assessment & Plan:   Problem List Items Addressed This Visit     Advanced care planning/counseling discussion (Chronic)    Previously discussed      Encounter for general adult medical examination with abnormal findings - Primary (Chronic)    Preventative protocols reviewed and updated unless pt declined. Discussed healthy diet and lifestyle.       Resistant  hypertension    Chronic, deteriorated control despite 5 drug regimen.  Rec monitoring BP at home, limiting salt/sodium in diet, return in 2-3 months for BP f/u visit.       Relevant Medications   amLODipine (NORVASC) 5 MG tablet   atorvastatin (LIPITOR) 40 MG tablet   metoprolol succinate (  TOPROL-XL) 25 MG 24 hr tablet   hydrALAZINE (APRESOLINE) 50 MG tablet   hydrochlorothiazide (HYDRODIURIL) 12.5 MG tablet   losartan (COZAAR) 100 MG tablet   Hyperlipidemia associated with type 2 diabetes mellitus (HCC)    Chronic, continue atorvastatin, update FLP.  The ASCVD Risk score (Arnett DK, et al., 2019) failed to calculate for the following reasons:   The 2019 ASCVD risk score is only valid for ages 90 to 35       Relevant Medications   amLODipine (NORVASC) 5 MG tablet   atorvastatin (LIPITOR) 40 MG tablet   Insulin Glargine (BASAGLAR KWIKPEN) 100 UNIT/ML   metoprolol succinate (TOPROL-XL) 25 MG 24 hr tablet   hydrALAZINE (APRESOLINE) 50 MG tablet   hydrochlorothiazide (HYDRODIURIL) 12.5 MG tablet   losartan (COZAAR) 100 MG tablet   Other Relevant Orders   Lipid panel (Completed)   Comprehensive metabolic panel (Completed)   Severe obesity (BMI 35.0-39.9) with comorbidity ([place])    Continue to encourage healthy diet and lifestyle choices.  Obesity complicated by diabetes, gout, hypertension, hyperlipidemia, osteoarthritis.       Relevant Medications   Insulin Glargine (BASAGLAR KWIKPEN) 100 UNIT/ML   Type 2 diabetes mellitus with hyperglycemia ([place])    She continues basaglar 18u daily. Update A1c. Consider increased dose pending lab results      Relevant Medications   atorvastatin (LIPITOR) 40 MG tablet   Insulin Glargine (BASAGLAR KWIKPEN) 100 UNIT/ML   losartan (COZAAR) 100 MG tablet   Other Relevant Orders   Hemoglobin A1c (Completed)   Aortic atherosclerosis (HCC)    Continue plavix, statin.       Relevant Medications   amLODipine (NORVASC) 5 MG tablet   atorvastatin  (LIPITOR) 40 MG tablet   metoprolol succinate (TOPROL-XL) 25 MG 24 hr tablet   hydrALAZINE (APRESOLINE) 50 MG tablet   hydrochlorothiazide (HYDRODIURIL) 12.5 MG tablet   losartan (COZAAR) 100 MG tablet   Anemia    Update labs.      History of CVA (cerebrovascular accident)   Gout    Continue colchicine prn. Update urate       Relevant Orders   Uric acid (Completed)   Vitamin D deficiency    Update levels on 2000 IU daily.       Relevant Orders   VITAMIN D 25 Hydroxy (Vit-D Deficiency, Fractures) (Completed)   Borderline hypothyroidism    Update thyroid function.       Relevant Medications   metoprolol succinate (TOPROL-XL) 25 MG 24 hr tablet   Other Relevant Orders   TSH (Completed)   T4, free (Completed)   Urinary urgency    With incomplete emptying - check UA today to r/o UTI.       Relevant Orders   POCT Urinalysis Dipstick (Automated) (Completed)   Polypharmacy   Type 2 diabetes mellitus with vascular disease (HCC)   Relevant Medications   amLODipine (NORVASC) 5 MG tablet   atorvastatin (LIPITOR) 40 MG tablet   Insulin Glargine (BASAGLAR KWIKPEN) 100 UNIT/ML   metoprolol succinate (TOPROL-XL) 25 MG 24 hr tablet   hydrALAZINE (APRESOLINE) 50 MG tablet   hydrochlorothiazide (HYDRODIURIL) 12.5 MG tablet   losartan (COZAAR) 100 MG tablet   Chronic kidney disease, stage 3a (HCC)    Update renal panel      Relevant Orders   Phosphorus (Completed)   VITAMIN D 25 Hydroxy (Vit-D Deficiency, Fractures) (Completed)   Parathyroid hormone, intact (no Ca) (Completed)   CBC with Differential/Platelet (Completed)   Low serum vitamin  B12    Update levels off replacement - oral replacement caused GI upset. .       Relevant Orders   Vitamin B12 (Completed)   Type 2 diabetes mellitus with moderate nonproliferative diabetic retinopathy without macular edema, left eye (HCC)    Rec schedule eye exam.       Relevant Medications   atorvastatin (LIPITOR) 40 MG tablet    Insulin Glargine (BASAGLAR KWIKPEN) 100 UNIT/ML   losartan (COZAAR) 100 MG tablet   Pancreas cyst    From prior MRI 05/2021 - 1 cm thin walled cystic lesion developed in the anterior aspect of the pancreatic head, probable pseudocyst or side branch IPMN. Pancreas dedicated MRI recommended. Discussed obtaining pancreas MRI for further evaluation.       Relevant Orders   MR ABDOMEN W WO CONTRAST     Meds ordered this encounter  Medications   amLODipine (NORVASC) 5 MG tablet    Sig: Take 1 tablet (5 mg total) by mouth daily.    Dispense:  90 tablet    Refill:  4   atorvastatin (LIPITOR) 40 MG tablet    Sig: Take 1 tablet (40 mg total) by mouth daily.    Dispense:  90 tablet    Refill:  4   clopidogrel (PLAVIX) 75 MG tablet    Sig: Take 1 tablet (75 mg total) by mouth every morning.    Dispense:  90 tablet    Refill:  4   Insulin Glargine (BASAGLAR KWIKPEN) 100 UNIT/ML    Sig: Inject 18 Units into the skin at bedtime.    Dispense:  15 mL    Refill:  4   metoprolol succinate (TOPROL-XL) 25 MG 24 hr tablet    Sig: Take 0.5 tablets (12.5 mg total) by mouth daily.    Dispense:  45 tablet    Refill:  4   hydrALAZINE (APRESOLINE) 50 MG tablet    Sig: Take 1 tablet (50 mg total) by mouth in the morning and at bedtime.    Dispense:  180 tablet    Refill:  4   hydrochlorothiazide (HYDRODIURIL) 12.5 MG tablet    Sig: Take 1 tablet (12.5 mg total) by mouth every morning.    Dispense:  90 tablet    Refill:  4   losartan (COZAAR) 100 MG tablet    Sig: Take 1 tablet (100 mg total) by mouth every morning.    Dispense:  90 tablet    Refill:  4   potassium chloride (KLOR-CON) 10 MEQ tablet    Sig: Take 1 tablet (10 mEq total) by mouth every Monday, Wednesday, and Friday.    Dispense:  40 tablet    Refill:  4    Orders Placed This Encounter  Procedures   MR ABDOMEN W WO CONTRAST    Standing Status:   Future    Standing Expiration Date:   06/10/2023    Scheduling Instructions:      Pancreas protocol MRI by pancreatic cyst on prior imaging 05/2021    Order Specific Question:   If indicated for the ordered procedure, I authorize the administration of contrast media per Radiology protocol    Answer:   Yes    Order Specific Question:   What is the patient's sedation requirement?    Answer:   No Sedation    Order Specific Question:   Does the patient have a pacemaker or implanted devices?    Answer:   No    Order Specific  Question:   Preferred imaging location?    Answer:   [facility] (table limit-350lbs)   Hemoglobin A1c   Lipid panel   Comprehensive metabolic panel   Phosphorus   VITAMIN D 25 Hydroxy (Vit-D Deficiency, Fractures)   Parathyroid hormone, intact (no Ca)   CBC with Differential/Platelet   Vitamin B12   Uric acid   TSH   T4, free   POCT Urinalysis Dipstick (Automated)    Patient Instructions  Labwork today  [facility] and let us know.  Schedule diabetic eye exam.  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  Blood pressure is staying high - make sure you're taking 5 blood pressure medicines daily. Work on low salt/sodium diet, drink plenty of water, return in 2-3 months for blood pressure follow up visit.   Follow up plan: Return in about 3 months (around 09/05/2022), or if symptoms worsen or fail to improve, for follow up visit.  [name], MD

## 2022-06-08 LAB — PARATHYROID HORMONE, INTACT (NO CA): PTH: 52 pg/mL (ref 16–77)

## 2022-06-09 ENCOUNTER — Ambulatory Visit: Payer: Self-pay

## 2022-06-09 DIAGNOSIS — K862 Cyst of pancreas: Secondary | ICD-10-CM | POA: Insufficient documentation

## 2022-06-09 NOTE — Assessment & Plan Note (Signed)
With incomplete emptying - check UA today to r/o UTI.

## 2022-06-09 NOTE — Assessment & Plan Note (Signed)
Chronic, deteriorated control despite 5 drug regimen.  Rec monitoring BP at home, limiting salt/sodium in diet, return in 2-3 months for BP f/u visit.

## 2022-06-09 NOTE — Assessment & Plan Note (Signed)
Update levels on 2000 IU daily. 

## 2022-06-09 NOTE — Assessment & Plan Note (Signed)
From prior MRI 05/2021 - 1 cm thin walled cystic lesion developed in the anterior aspect of the pancreatic head, probable pseudocyst or side branch IPMN. Pancreas dedicated MRI recommended. Discussed obtaining pancreas MRI for further evaluation.

## 2022-06-09 NOTE — Assessment & Plan Note (Signed)
Rec schedule eye exam.

## 2022-06-09 NOTE — Patient Outreach (Signed)
  Care Coordination   Follow Up Visit Note   06/09/2022 Name: TALISHA LULLO MRN: [id number] DOB: 1940-10-22  Tieara Arevalos Dercole is a 82 y.o. year old female who sees [name], MD for primary care. I spoke with  Michell Heinrich Berkheimer by phone today.  What matters to the patients health and wellness today?  "I know my lab work is up."    Goals Addressed             This Visit's Progress    Patient stated:  " Managing my diabetes and other health conditions       Interventions Today    Flowsheet Row Most Recent Value  Chronic Disease   Chronic disease during today's visit Diabetes, Hypertension (HTN)  [Patient verbally agreed to next telephone outreach with [facility] on 07/17/22.]  General Interventions   General Interventions Discussed/Reviewed General Interventions Reviewed, Labs, Doctor Visits, Lipid Profile  Labs Hgb A1c annually  [Discussed with patient elevated Hgb A1 c.]  Doctor Visits Discussed/Reviewed Doctor Visits Reviewed  [Patients annual wellness visit reviewed.  Discussed provider recommendations related to patients diabetes and hypertension.  Patient advised to contact provider office to schedule 2-3 month follow up visit as recommended.]  Exercise Interventions   Exercise Discussed/Reviewed Physical Activity  Physical Activity Discussed/Reviewed Physical Activity Discussed  [Patient advised to increase physical activity as tolerated.  [facility] exercise booklet mailed to patient]  Education Interventions   Education Provided Provided Printed Education  [Managing diabetes and hypertension education articles sent to patient.  Discussed low salt diet options with patient.  Patient advised to monitor blood pressure at least 2-3 times per week and record.  Advised to take bp readings to next provider appt.]  Nutrition Interventions   Nutrition Discussed/Reviewed Nutrition Discussed, Carbohydrate meal planning, Decreasing salt, Portion sizes  Pharmacy Interventions   Pharmacy  Dicussed/Reviewed Pharmacy Topics Reviewed  [Medications reviewed and compliance discussed.  Message sent to [facility], pharmacist at patients request regarding basaglar [employer] Rigg being delivered every 90 days vs monthly.]                  SDOH assessments and interventions completed:  No     Care Coordination Interventions:  Yes, provided   Follow up plan: Follow up call scheduled for 07/17/22    Encounter Outcome:  Pt. Visit Completed {[facility] Tip this will not be part of the note when signed-REQUIRED REPORT FIELD DO NOT DELETE (Optional):[id number]  Quinn Plowman RN,BSN,CCM [place] [phone] direct line

## 2022-06-09 NOTE — Assessment & Plan Note (Signed)
Continue plavix, statin.

## 2022-06-09 NOTE — Assessment & Plan Note (Signed)
Chronic, continue atorvastatin, update FLP.  The ASCVD Risk score (Arnett DK, et al., 2019) failed to calculate for the following reasons:   The 2019 ASCVD risk score is only valid for ages 69 to 27

## 2022-06-09 NOTE — Assessment & Plan Note (Signed)
Continue to encourage healthy diet and lifestyle choices.  Obesity complicated by diabetes, gout, hypertension, hyperlipidemia, osteoarthritis.

## 2022-06-09 NOTE — Patient Instructions (Signed)
Visit Information  Thank you for taking time to visit with me today. Please don't hesitate to contact me if I can be of assistance to you.   Following are the goals we discussed today:   Goals Addressed             This Visit's Progress    Patient stated:  " Managing my diabetes and other health conditions       Interventions Today    Flowsheet Row Most Recent Value  Chronic Disease   Chronic disease during today's visit Diabetes, Hypertension (HTN)  [Patient verbally agreed to next telephone outreach with [facility] on 07/17/22.]  General Interventions   General Interventions Discussed/Reviewed General Interventions Reviewed, Labs, Doctor Visits, Lipid Profile  Labs Hgb A1c annually  [Discussed with patient elevated Hgb A1 c.]  Doctor Visits Discussed/Reviewed Doctor Visits Reviewed  [Patients annual wellness visit reviewed.  Discussed provider recommendations related to patients diabetes and hypertension.  Patient advised to contact provider office to schedule 2-3 month follow up visit as recommended.]  Exercise Interventions   Exercise Discussed/Reviewed Physical Activity  Physical Activity Discussed/Reviewed Physical Activity Discussed  [Patient advised to increase physical activity as tolerated.  [facility] exercise booklet mailed to patient]  Education Interventions   Education Provided Provided Printed Education  [Managing diabetes and hypertension education articles sent to patient.  Discussed low salt diet options with patient.  Patient advised to monitor blood pressure at least 2-3 times per week and record.  Advised to take bp readings to next provider appt.]  Nutrition Interventions   Nutrition Discussed/Reviewed Nutrition Discussed, Carbohydrate meal planning, Decreasing salt, Portion sizes  Pharmacy Interventions   Pharmacy Dicussed/Reviewed Pharmacy Topics Reviewed  [Medications reviewed and compliance discussed.  Message sent to [facility], pharmacist at patients request regarding  basaglar [place] being delivered every 90 days vs monthly.]                  Our next appointment is by telephone on 07/17/22 at 10:00 am  Please call the care guide team at [phone] if you need to cancel or reschedule your appointment.   If you are experiencing a Mental Health or [place] or need someone to talk to, please call the Suicide and Crisis Lifeline: 988 call 1-800-273-TALK (toll free, 24 hour hotline)  The patient verbalized understanding of instructions, educational materials, and care plan provided today and agreed to receive a mailed copy of patient instructions, educational materials, and care plan.   Quinn Plowman RN,BSN,CCM [facility] Care Coordination [phone] direct line   Type 2 Diabetes Mellitus, Self-Care, Adult When you have type 2 diabetes (type 2 diabetes mellitus), you must make sure your blood sugar (glucose) stays in a healthy range. You can do this with: Nutrition. Exercise. Lifestyle changes. Medicines or insulin, if needed. Support from your doctors and others. What are the risks? Having type 2 diabetes can raise your risk for other long-term (chronic) health problems. You may get medicines to help prevent these problems. How to stay aware of your blood sugar  Check your blood sugar level every day, as often as told. Have your A1C (hemoglobin A1C) level checked two or more times a year. Have it checked more often if told. Your doctor will set personal treatment goals for you. In general, you should have these blood sugar levels: Before meals: 80-130 mg/dL (4.4-7.2 mmol/L). After meals: below 180 mg/dL (10 mmol/L). A1C: less than 7%. How to manage high and low blood sugar Symptoms of high blood  sugar High blood sugar is also called hyperglycemia. Know the symptoms of high blood sugar. These may include: More thirst. Hunger. Feeling very tired. Needing to pee (urinate) more often than normal. Seeing things  blurry. Symptoms of low blood sugar Low blood sugar is also called hypoglycemia. This is when blood sugar is at or below 70 mg/dL (3.9 mmol/L). Symptoms may include: Hunger. Feeling worried or nervous (anxious). Feeling sweaty and cold to the touch (clammy). Being dizzy or light-headed. Feeling sleepy. A fast heartbeat. Feeling grouchy (irritable). Tingling or loss of feeling (numbness) around your mouth, lips, or tongue. Restless sleep. Diabetes medicines can cause low blood sugar. You are more at risk: While you exercise. After exercise. During sleep. When you are sick. When you skip meals or do not eat for a long time. Treating low blood sugar If you think you have low blood sugar, eat or drink something sugary right away. Keep 15 grams of a fast-acting carb (carbohydrate) with you all the time. Make sure your family and friends know how to treat you if you cannot treat yourself. Treating very low blood sugar Severe hypoglycemia is when your blood sugar is at or below 54 mg/dL (3 mmol/L). Severe hypoglycemia is an emergency. Get medical help right away. Call your local emergency services (911 in the [place] Do not wait to see if the symptoms will go away. Do not drive yourself to the hospital. You may need a glucagon shot if you have very low blood sugar and you cannot eat or drink. Have a family member or friend learn how to check your blood sugar and how to give you a glucagon shot. Ask your doctor if you should have a kit for glucagon shots. Follow these instructions at home: Medicines Take prescribed insulin or diabetes medicines as told by your health care provider. Do not run out of insulin or other medicines. Plan ahead. If you use insulin, change the amount you take based on how active you are and what foods you eat. Your doctor will tell you how to do this. Take over-the-counter and prescription medicines only as told by your doctor. Eating and drinking  Eat healthy  foods. These include: Low-fat (lean) proteins. Complex carbs, such as whole grains. Fresh fruits and vegetables. Low-fat dairy products. Healthy fats. Meet with a food expert (dietitian) to make an eating plan. Follow instructions from your doctor about what you cannot eat or drink. Drink enough fluid to keep your pee (urine) pale yellow. Keep track of carbs that you eat. Read food labels and learn serving sizes of foods. Follow your sick-day plan when you cannot eat or drink as normal. Make this plan with your doctor so it is ready to use. Activity Exercise as told by your doctor. You may need to: Do stretching and strength exercises two or more times a week. Do 150 minutes or more of exercise each week that makes your heart beat faster and makes you sweat. Spread out your exercise over 3 or more days a week. Do not go more than 2 days in a row without exercise. Talk with your doctor before you start a new exercise. Your doctor may tell you to change: How much insulin or medicines you take. How much food you eat. Lifestyle Do not smoke or use any products that contain nicotine or tobacco. If you need help quitting, ask your doctor. If you drink alcohol and your doctor says that it is safe for you: Limit how much you have  to: 0-1 drink a day for women who are not pregnant. 0-2 drinks a day for men. Know how much alcohol is in your drink. In the [place] one drink equals one 12 oz bottle of beer (355 mL), one 5 oz glass of wine (148 mL), or one 1 oz glass of hard liquor (44 mL). Learn to deal with stress. If you need help, ask your doctor. Body care  Stay up to date with your shots (immunizations). Have your eyes and feet checked by a doctor as often as told. Check your skin and feet every day. Check for cuts, bruises, redness, blisters, or sores. Brush your teeth and gums two times a day. Floss one or more times a day. Go to the dentist one or more times every 6 months. Stay at a  healthy weight. General instructions Share your diabetes care plan with: Your work or school. People you live with. Carry a card or wear jewelry that says you have diabetes. Keep all follow-up visits. Questions to ask your doctor Do I need to meet with a certified expert in diabetes education and care? Where can I find a support group? Where to find more information For help and guidance and more information about diabetes, please go to: American Diabetes Association: www.diabetes.org American Association of Diabetes Care and Education Specialists: www.diabeteseducator.org International Diabetes Federation: MemberVerification.ca Summary When you have type 2 diabetes, you must make sure your blood sugar (glucose) stays in a healthy range. You can do this with nutrition, exercise, medicines and insulin, and support from doctors and others. Check your blood sugar every day, or as often as told. Having diabetes can raise your risk for other long-term health problems. You may get medicines to help prevent these problems. Share your diabetes management plan with people at work, school, and home. Keep all follow-up visits. This information is not intended to replace advice given to you by your health care provider. Make sure you discuss any questions you have with your health care provider. Document Revised: 07/05/2020 Document Reviewed: 07/05/2020 Elsevier Patient Education  [place] Your Hypertension Hypertension, also called high blood pressure, is when the force of the blood pressing against the walls of the arteries is too strong. Arteries are blood vessels that carry blood from your heart throughout your body. Hypertension forces the heart to work harder to pump blood and may cause the arteries to become narrow or stiff. Understanding blood pressure readings A blood pressure reading includes a higher number over a lower number: The first, or top, number is called the systolic  pressure. It is a measure of the pressure in your arteries as your heart beats. The second, or bottom number, is called the diastolic pressure. It is a measure of the pressure in your arteries as the heart relaxes. For most people, a normal blood pressure is below 120/80. Your personal target blood pressure may vary depending on your medical conditions, your age, and other factors. Blood pressure is classified into four stages. Based on your blood pressure reading, your health care provider may use the following stages to determine what type of treatment you need, if any. Systolic pressure and diastolic pressure are measured in a unit called millimeters of mercury (mmHg). Normal Systolic pressure: below 123456. Diastolic pressure: below 80. Elevated Systolic pressure: [id number]. Diastolic pressure: below 80. Hypertension stage 1 Systolic pressure: 0000000. Diastolic pressure: [id number]. Hypertension stage 2 Systolic pressure: [id number] or above. Diastolic pressure: 90 or above. How can this condition affect  me? Managing your hypertension is very important. Over time, hypertension can damage the arteries and decrease blood flow to parts of the body, including the brain, heart, and kidneys. Having untreated or uncontrolled hypertension can lead to: A heart attack. A stroke. A weakened blood vessel (aneurysm). Heart failure. Kidney damage. Eye damage. Memory and concentration problems. Vascular dementia. What actions can I take to manage this condition? Hypertension can be managed by making lifestyle changes and possibly by taking medicines. Your health care provider will help you make a plan to bring your blood pressure within a normal range. You may be referred for counseling on a healthy diet and physical activity. Nutrition  Eat a diet that is high in fiber and potassium, and low in salt (sodium), added sugar, and fat. An example eating plan is called the DASH diet. DASH stands for Dietary Approaches  to Stop Hypertension. To eat this way: Eat plenty of fresh fruits and vegetables. Try to fill one-half of your plate at each meal with fruits and vegetables. Eat whole grains, such as whole-wheat pasta, brown rice, or whole-grain bread. Fill about one-fourth of your plate with whole grains. Eat low-fat dairy products. Avoid fatty cuts of meat, processed or cured meats, and poultry with skin. Fill about one-fourth of your plate with lean proteins such as fish, chicken without skin, beans, eggs, and tofu. Avoid pre-made and processed foods. These tend to be higher in sodium, added sugar, and fat. Reduce your daily sodium intake. Many people with hypertension should eat less than 1,500 mg of sodium a day. Lifestyle  Work with your health care provider to maintain a healthy body weight or to lose weight. Ask what an ideal weight is for you. Get at least 30 minutes of exercise that causes your heart to beat faster (aerobic exercise) most days of the week. Activities may include walking, swimming, or biking. Include exercise to strengthen your muscles (resistance exercise), such as weight lifting, as part of your weekly exercise routine. Try to do these types of exercises for 30 minutes at least 3 days a week. Do not use any products that contain nicotine or tobacco. These products include cigarettes, chewing tobacco, and vaping devices, such as e-cigarettes. If you need help quitting, ask your health care provider. Control any long-term (chronic) conditions you have, such as high cholesterol or diabetes. Identify your sources of stress and find ways to manage stress. This may include meditation, deep breathing, or making time for fun activities. Alcohol use Do not drink alcohol if: Your health care provider tells you not to drink. You are pregnant, may be pregnant, or are planning to become pregnant. If you drink alcohol: Limit how much you have to: 0-1 drink a day for women. 0-2 drinks a day for  men. Know how much alcohol is in your drink. In the [place] one drink equals one 12 oz bottle of beer (355 mL), one 5 oz glass of wine (148 mL), or one 1 oz glass of hard liquor (44 mL). Medicines Your health care provider may prescribe medicine if lifestyle changes are not enough to get your blood pressure under control and if: Your systolic blood pressure is 130 or higher. Your diastolic blood pressure is 80 or higher. Take medicines only as told by your health care provider. Follow the directions carefully. Blood pressure medicines must be taken as told by your health care provider. The medicine does not work as well when you skip doses. Skipping doses also puts you at  risk for problems. Monitoring Before you monitor your blood pressure: Do not smoke, drink caffeinated beverages, or exercise within 30 minutes before taking a measurement. Use the bathroom and empty your bladder (urinate). Sit quietly for at least 5 minutes before taking measurements. Monitor your blood pressure at home as told by your health care provider. To do this: Sit with your back straight and supported. Place your feet flat on the floor. Do not cross your legs. Support your arm on a flat surface, such as a table. Make sure your upper arm is at heart level. Each time you measure, take two or three readings one minute apart and record the results. You may also need to have your blood pressure checked regularly by your health care provider. General information Talk with your health care provider about your diet, exercise habits, and other lifestyle factors that may be contributing to hypertension. Review all the medicines you take with your health care provider because there may be side effects or interactions. Keep all follow-up visits. Your health care provider can help you create and adjust your plan for managing your high blood pressure. Where to find more information National Heart, Lung, and Blood Institute:  https://wilson-eaton.com/ American Heart Association: www.heart.org Contact a health care provider if: You think you are having a reaction to medicines you have taken. You have repeated (recurrent) headaches. You feel dizzy. You have swelling in your ankles. You have trouble with your vision. Get help right away if: You develop a severe headache or confusion. You have unusual weakness or numbness, or you feel faint. You have severe pain in your chest or abdomen. You vomit repeatedly. You have trouble breathing. These symptoms may be an emergency. Get help right away. Call 911. Do not wait to see if the symptoms will go away. Do not drive yourself to the hospital. Summary Hypertension is when the force of blood pumping through your arteries is too strong. If this condition is not controlled, it may put you at risk for serious complications. Your personal target blood pressure may vary depending on your medical conditions, your age, and other factors. For most people, a normal blood pressure is less than 120/80. Hypertension is managed by lifestyle changes, medicines, or both. Lifestyle changes to help manage hypertension include losing weight, eating a healthy, low-sodium diet, exercising more, stopping smoking, and limiting alcohol. This information is not intended to replace advice given to you by your health care provider. Make sure you discuss any questions you have with your health care provider. Document Revised: 12/23/2020 Document Reviewed: 12/23/2020 Elsevier Patient Education  [place].

## 2022-06-09 NOTE — Assessment & Plan Note (Addendum)
Update levels off replacement - oral replacement caused GI upset. Marland Kitchen

## 2022-06-09 NOTE — Assessment & Plan Note (Signed)
Update thyroid function.

## 2022-06-09 NOTE — Assessment & Plan Note (Signed)
She continues basaglar 18u daily. Update A1c. Consider increased dose pending lab results

## 2022-06-09 NOTE — Assessment & Plan Note (Signed)
Continue colchicine prn. Update urate

## 2022-06-09 NOTE — Assessment & Plan Note (Signed)
Update labs.  

## 2022-06-09 NOTE — Assessment & Plan Note (Signed)
Update renal panel 

## 2022-06-10 ENCOUNTER — Other Ambulatory Visit: Payer: Self-pay | Admitting: Family Medicine

## 2022-06-10 MED ORDER — BASAGLAR KWIKPEN 100 UNIT/ML ~~LOC~~ SOPN: 20.0000 [IU] | PEN_INJECTOR | Freq: Every day | SUBCUTANEOUS | 4 refills | Status: DC

## 2022-06-20 ENCOUNTER — Other Ambulatory Visit (HOSPITAL_COMMUNITY): Payer: Medicare Other

## 2022-06-23 ENCOUNTER — Telehealth: Payer: Self-pay

## 2022-06-23 ENCOUNTER — Ambulatory Visit (HOSPITAL_COMMUNITY)
Admission: RE | Admit: 2022-06-23 | Discharge: 2022-06-23 | Disposition: A | Discharge status: Home / Self Care | Payer: Medicare Other | Source: Ambulatory Visit | Attending: Family Medicine | Admitting: Family Medicine

## 2022-06-23 DIAGNOSIS — K862 Cyst of pancreas: Secondary | ICD-10-CM | POA: Diagnosis not present

## 2022-06-23 DIAGNOSIS — N281 Cyst of kidney, acquired: Secondary | ICD-10-CM | POA: Diagnosis not present

## 2022-06-23 MED ORDER — GADOBUTROL 1 MMOL/ML IV SOLN
7.5000 mL | Freq: Once | INTRAVENOUS | Status: AC | PRN
  Administered 2022-06-23: 7.5 mL via INTRAVENOUS

## 2022-06-23 NOTE — Progress Notes (Signed)
Care Management & Coordination Services Pharmacy Team  Reason for Encounter: Medication coordination and delivery  Contacted patient to discuss medications and coordinate delivery from [facility]. Spoke with patient on 06/26/2022  Cycle dispensing form sent to [facility] for review.   Last adherence delivery date:06/06/22      Patient is due for next adherence delivery on: 07/05/22  This delivery to include: Adherence Packaging  30 Days  Atorvastatin 40 mg 1 tablet daily (breakfast) Metoprolol Succinate 25 mg take 1/2  tablet daily (breakfast)  Hydralazine 50 mg 1 tablet twice daily (1-breakfast, 1-bedtime) Clopidogrel 75 mg 1 tablet daily (breakfast) Potassium chloride ER 10 Meq 1 tablet daily on Mon, Wed, Friday (breakfast) Amlodipine 5 mg 1 tablet daily (breakfast) Losartan 100 mg 1 tablet daily (breakfast Hydrochlorothiazide 12.5 mg 1 tablet daily (breakfast)  Patient declined the following medications this month: Pen needles- supply on hand  Lancets- supply on hand   No refill request needed.  Confirmed delivery date of 07/05/22, advised patient that pharmacy will contact them the morning of delivery.   Any concerns about your medications? No  How often do you forget or accidentally miss a dose? Never  Do you use a pillbox? No  Is patient in packaging Yes  What is the date on your next pill pack? Does not have her glasses  Any concerns or issues with your packaging? satisfied   Recent blood pressure readings are as follows: none available   Recent blood glucose readings are as follows:none available    Chart review: Recent office visits:  2/14/24Garlon Hatchet Gutierrez,MD(PCP)- [facility],discussed screenings,vaccines,labs,(mild anemia, A1c 9.7, other labs stable) increase basaglar to 20 units daily ,schedule DM eye,f/u 2-3 months.  Recent consult visits:  None since last contact  Hospital visits:  None in previous 6 months  Medications: Outpatient  Encounter Medications as of 06/23/2022  Medication Sig   amLODipine (NORVASC) 5 MG tablet Take 1 tablet (5 mg total) by mouth daily.   atorvastatin (LIPITOR) 40 MG tablet Take 1 tablet (40 mg total) by mouth daily.   bismuth-metronidazole-tetracycline (PYLERA) 140-125-125 MG capsule Take 3 capsules by mouth 4 (four) times daily -  before meals and at bedtime.   Blood Glucose Monitoring Suppl (ONETOUCH VERIO) w/Device KIT Use to check blood sugar up to 4 times a day as directed   Cholecalciferol (VITAMIN D) 2000 units CAPS Take 2,000 Units by mouth daily.   clopidogrel (PLAVIX) 75 MG tablet Take 1 tablet (75 mg total) by mouth every morning.   Colchicine (MITIGARE) 0.6 MG CAPS Take 1 tablet by mouth daily as needed (for gout).   COMFORT EZ PEN NEEDLES 31G X 5 MM MISC USE ONCE daily   glucose blood (ONETOUCH VERIO) test strip USE TO check blood glucose AS DIRECTED four times daily   hydrALAZINE (APRESOLINE) 50 MG tablet Take 1 tablet (50 mg total) by mouth in the morning and at bedtime.   hydrochlorothiazide (HYDRODIURIL) 12.5 MG tablet Take 1 tablet (12.5 mg total) by mouth every morning.   Insulin Glargine (BASAGLAR KWIKPEN) 100 UNIT/ML Inject 20 Units into the skin at bedtime.   Lancets (ONETOUCH DELICA PLUS [id number]) MISC Use as instructed to check blood sugar once a day   losartan (COZAAR) 100 MG tablet Take 1 tablet (100 mg total) by mouth every morning.   metoprolol succinate (TOPROL-XL) 25 MG 24 hr tablet Take 0.5 tablets (12.5 mg total) by mouth daily.   nitroGLYCERIN (NITROSTAT) 0.4 MG SL tablet Place 0.4 mg under the  tongue every 5 (five) minutes as needed for chest pain.   nystatin cream (MYCOSTATIN) Apply 1 application topically 2 (two) times daily. (Patient taking differently: Apply 1 application  topically daily as needed for dry skin (rash).)   potassium chloride (KLOR-CON) 10 MEQ tablet Take 1 tablet (10 mEq total) by mouth every Monday, Wednesday, and Friday.   No  facility-administered encounter medications on file as of 06/23/2022.   BP Readings from Last 3 Encounters:  06/07/22 (!) 154/84  01/20/22 (!) 148/96  12/05/21 134/80    Pulse Readings from Last 3 Encounters:  06/07/22 75  01/20/22 78  12/05/21 64    Lab Results  Component Value Date/Time   HGBA1C 9.7 (H) 06/07/2022 10:39 AM   HGBA1C 8.4 (A) 12/05/2021 10:28 AM   HGBA1C 9.2 (H) 05/30/2021 10:46 AM   Lab Results  Component Value Date   CREATININE 1.14 06/07/2022   BUN 17 06/07/2022   GFR 45.36 (L) 06/07/2022   GFRNONAA 53 (L) 05/30/2021   GFRAA >60 02/20/2018   NA 140 06/07/2022   K 4.1 06/07/2022   CALCIUM 9.6 06/07/2022   CO2 31 06/07/2022     [name], PharmD notified  Avel Sensor, [place] Assistant [phone]

## 2022-06-29 ENCOUNTER — Encounter: Payer: Self-pay | Admitting: Family Medicine

## 2022-06-29 DIAGNOSIS — K769 Liver disease, unspecified: Secondary | ICD-10-CM | POA: Insufficient documentation

## 2022-07-03 NOTE — Telephone Encounter (Signed)
Spoke to patient by telephone and advised her that the results were mailed to her. Results were given to patient while on the telephone. Patient stated that she has stomach pains some times if she eats late or eats a heavy meal and goes to bed. Patient stated that she was told that she may be having acid reflux. Patient stated that the pain happens sometimes when she eats fried food. Patient wants to know if she should take something over the counter for acid reflux to see if that will help her stomach pain when she has it.

## 2022-07-03 NOTE — Telephone Encounter (Signed)
Patient is asking for update on MRI results from 3/1. It looks like results were mailed on 3/7.  She also reports ongoing stomach pain and requests medication for this. Advised office visit may be needed to evaluate this.   Routing to PCP pool for assistance.

## 2022-07-04 MED ORDER — FAMOTIDINE 20 MG PO TABS: 20.0000 mg | ORAL_TABLET | Freq: Every day | ORAL | Status: DC

## 2022-07-04 NOTE — Telephone Encounter (Signed)
Is this more heartburn/acid reflux symptoms after eating? She can try OTC pepcid '20mg'$  nightly for heartburn symptoms. She can also try below recs:  Head of bed elevated. Avoidance of citrus, fatty foods, chocolate, peppermint, and excessive alcohol, along with sodas, orange juice (acidic drinks) At least a few hours between dinner and bed, minimize naps after eating  Would offer OV if not improved with above.

## 2022-07-04 NOTE — Addendum Note (Signed)
Addended by: [name] on: 07/04/2022 07:58 AM   Modules accepted: Orders

## 2022-07-04 NOTE — Telephone Encounter (Signed)
Patient notified as instructed by telephone and verbalized understanding. Patient stated the symptoms that she is having feels like heartburn/acid reflux after eating. Patient stated that she watched what she ate last night and did not have any symptoms. Patient stated when she got up this morning and fried her some eggs she starting with the heartburn after eating. Patient stated she will call and schedule an appointment if she continues to have problems after making the changes that Dr. [name] recommends.

## 2022-07-18 ENCOUNTER — Ambulatory Visit: Payer: Self-pay

## 2022-07-18 NOTE — Patient Outreach (Signed)
  Care Coordination   Follow Up Visit Note   07/18/2022 Name: Kristen [name] MRN: [id number] DOB: 1940/10/13  Kristen [name] is a 82 y.o. year old female who sees [name], MD for primary care. I spoke with  Kristen [name] by phone today.  What matters to the patients health and wellness today?  Patient states she is doing well and denies any pain. She reports blood sugars range 80-180 and blood pressure ranges 140/70-160's/80's.  Patient states she is concerned her blood pressure  monitor is not reading  correctly because it is older.   Patient states she is scheduled for an eye appointment in May 2024.  Denies any new  or ongoing concerns.     Goals Addressed             This Visit's Progress    Patient stated:  " Managing my diabetes and other health conditions       Interventions Today    Flowsheet Row Most Recent Value  Chronic Disease   Chronic disease during today's visit Diabetes, Hypertension (HTN)  General Interventions   General Interventions Discussed/Reviewed General Interventions Reviewed, Doctor Visits, Durable Medical Equipment (DME)  [Evaluation of current treatment plan related to diabetes/ hypertension and patients adherence to plan as established by provider.  Assess for home blood pressure and blood sugar readings.]  Doctor Visits Discussed/Reviewed Doctor Visits Reviewed  Jolinda Croak scheduled/ upcoming provider appointments.]  Durable Medical Equipment (DME) Other  [Advised to purchase new blood pressure montor.  Advised to call her insurance provider to access OTC benefit.]  Exercise Interventions   Exercise Discussed/Reviewed Physical Activity  Physical Activity Discussed/Reviewed Physical Activity Reviewed  [Discussed importance of increasing physical activity as tolerated.]  Education Interventions   Education Provided Provided Education  Provided Verbal Education On Blood Sugar Monitoring, Other  [Reviewed normal blood pressure and blood sugar  ranges]  Pharmacy Interventions   Pharmacy Dicussed/Reviewed Pharmacy Topics Reviewed  [Medications reviewed and compliance discussed.]                  SDOH assessments and interventions completed:  No     Care Coordination Interventions:  Yes, provided   Follow up plan: Follow up call scheduled for 09/15/22    Encounter Outcome:  Pt. Visit Completed   Quinn Plowman RN,BSN,CCM [place] [phone] direct line

## 2022-07-24 ENCOUNTER — Telehealth: Payer: Self-pay

## 2022-07-24 NOTE — Progress Notes (Unsigned)
Care Management & Coordination Services Pharmacy Team  Reason for Encounter: Medication coordination and delivery  Contacted patient to discuss medications and coordinate delivery from [facility]. {US HC Outreach:28874} Cycle dispensing form sent to *** for review.   Last adherence delivery date:***      Patient is due for next adherence delivery on: ***  This delivery to include: Adherence Packaging  30 Days  Atorvastatin 40 mg 1 tablet daily (breakfast) Metoprolol Succinate 25 mg take 1/2  tablet daily (breakfast)  Hydralazine 50 mg 1 tablet twice daily (1-breakfast, 1-bedtime) Clopidogrel 75 mg 1 tablet daily (breakfast) Potassium chloride ER 10 Meq 1 tablet daily on Mon, Wed, Friday (breakfast) Amlodipine 5 mg 1 tablet daily (breakfast) Losartan 100 mg 1 tablet daily (breakfast Hydrochlorothiazide 12.5 mg 1 tablet daily (breakfast)  Patient declined the following medications this month: Pen needles- supply on hand  Lancets- supply on hand   No refill request needed.  {Delivery UJ:6107908   Any concerns about your medications? {yes/no:20286}  How often do you forget or accidentally miss a dose? Never  Do you use a pillbox? No  Is patient in packaging {yes/no:20286}  What is the date on your next pill pack?  Any concerns or issues with your packaging?   Recent blood pressure readings are as follows:***  Recent blood glucose readings are as follows:***   Chart review: Recent office visits:  07/18/22-Davina Green,RN(fam med) f/u phone call-Denies any new  or ongoing concerns.    Recent consult visits:  None since last contact   Hospital visits:  None in previous 6 months  Medications: Outpatient Encounter Medications as of 07/24/2022  Medication Sig   amLODipine (NORVASC) 5 MG tablet Take 1 tablet (5 mg total) by mouth daily.   atorvastatin (LIPITOR) 40 MG tablet Take 1 tablet (40 mg total) by mouth daily.   bismuth-metronidazole-tetracycline  (PYLERA) 140-125-125 MG capsule Take 3 capsules by mouth 4 (four) times daily -  before meals and at bedtime.   Blood Glucose Monitoring Suppl (ONETOUCH VERIO) w/Device KIT Use to check blood sugar up to 4 times a day as directed   Cholecalciferol (VITAMIN D) 2000 units CAPS Take 2,000 Units by mouth daily.   clopidogrel (PLAVIX) 75 MG tablet Take 1 tablet (75 mg total) by mouth every morning.   Colchicine (MITIGARE) 0.6 MG CAPS Take 1 tablet by mouth daily as needed (for gout).   COMFORT EZ PEN NEEDLES 31G X 5 MM MISC USE ONCE daily   famotidine (PEPCID) 20 MG tablet Take 1 tablet (20 mg total) by mouth at bedtime.   glucose blood (ONETOUCH VERIO) test strip USE TO check blood glucose AS DIRECTED four times daily   hydrALAZINE (APRESOLINE) 50 MG tablet Take 1 tablet (50 mg total) by mouth in the morning and at bedtime.   hydrochlorothiazide (HYDRODIURIL) 12.5 MG tablet Take 1 tablet (12.5 mg total) by mouth every morning.   Insulin Glargine (BASAGLAR KWIKPEN) 100 UNIT/ML Inject 20 Units into the skin at bedtime.   Lancets (ONETOUCH DELICA PLUS 123XX123) MISC Use as instructed to check blood sugar once a day   losartan (COZAAR) 100 MG tablet Take 1 tablet (100 mg total) by mouth every morning.   metoprolol succinate (TOPROL-XL) 25 MG 24 hr tablet Take 0.5 tablets (12.5 mg total) by mouth daily.   nitroGLYCERIN (NITROSTAT) 0.4 MG SL tablet Place 0.4 mg under the tongue every 5 (five) minutes as needed for chest pain.   nystatin cream (MYCOSTATIN) Apply 1 application topically  2 (two) times daily. (Patient taking differently: Apply 1 application  topically daily as needed for dry skin (rash).)   potassium chloride (KLOR-CON) 10 MEQ tablet Take 1 tablet (10 mEq total) by mouth every Monday, Wednesday, and Friday.   No facility-administered encounter medications on file as of 07/24/2022.   BP Readings from Last 3 Encounters:  06/07/22 (!) 154/84  01/20/22 (!) 148/96  12/05/21 134/80    Pulse  Readings from Last 3 Encounters:  06/07/22 75  01/20/22 78  12/05/21 64    Lab Results  Component Value Date/Time   HGBA1C 9.7 (H) 06/07/2022 10:39 AM   HGBA1C 8.4 (A) 12/05/2021 10:28 AM   HGBA1C 9.2 (H) 05/30/2021 10:46 AM   Lab Results  Component Value Date   CREATININE 1.14 06/07/2022   BUN 17 06/07/2022   GFR 45.36 (L) 06/07/2022   GFRNONAA 53 (L) 05/30/2021   GFRAA >60 02/20/2018   NA 140 06/07/2022   K 4.1 06/07/2022   CALCIUM 9.6 06/07/2022   CO2 31 06/07/2022    [name], PharmD notified  Avel Sensor, [place] Assistant [phone]

## 2022-07-26 ENCOUNTER — Ambulatory Visit (INDEPENDENT_AMBULATORY_CARE_PROVIDER_SITE_OTHER): Payer: Medicare Other | Admitting: Family Medicine

## 2022-07-26 ENCOUNTER — Encounter: Payer: Self-pay | Admitting: Family Medicine

## 2022-07-26 VITALS — BP 136/64 | HR 79 | Temp 97.2°F | Ht 61.25 in | Wt 187.4 lb

## 2022-07-26 DIAGNOSIS — N3000 Acute cystitis without hematuria: Secondary | ICD-10-CM

## 2022-07-26 DIAGNOSIS — Z794 Long term (current) use of insulin: Secondary | ICD-10-CM

## 2022-07-26 DIAGNOSIS — E1165 Type 2 diabetes mellitus with hyperglycemia: Secondary | ICD-10-CM

## 2022-07-26 DIAGNOSIS — N3001 Acute cystitis with hematuria: Secondary | ICD-10-CM | POA: Diagnosis not present

## 2022-07-26 DIAGNOSIS — R1030 Lower abdominal pain, unspecified: Secondary | ICD-10-CM

## 2022-07-26 DIAGNOSIS — N390 Urinary tract infection, site not specified: Secondary | ICD-10-CM | POA: Insufficient documentation

## 2022-07-26 LAB — POC URINALSYSI DIPSTICK (AUTOMATED)
Bilirubin, UA: NEGATIVE
Blood, UA: NEGATIVE
Glucose, UA: NEGATIVE
Ketones, UA: NEGATIVE
Nitrite, UA: NEGATIVE
Protein, UA: POSITIVE — AB
Spec Grav, UA: 1.01 (ref 1.010–1.025)
Urobilinogen, UA: 0.2 E.U./dL
pH, UA: 6 (ref 5.0–8.0)

## 2022-07-26 MED ORDER — CEPHALEXIN 500 MG PO CAPS: 500.0000 mg | ORAL_CAPSULE | Freq: Two times a day (BID) | ORAL | 0 refills | Status: DC

## 2022-07-26 NOTE — Progress Notes (Addendum)
Patient ID: Kristen [name], female    DOB: 1941/02/09, 82 y.o.   MRN: [id number]  This visit was conducted in person.  BP 136/64   Pulse 79   Temp (!) 97.2 F (36.2 C) (Temporal)   Ht 5' 1.25" (1.556 m)   Wt 187 lb 6 oz (85 kg)   SpO2 98%   BMI 35.12 kg/m    CC: lower abdominal and back pain  Subjective:   HPI: Kristen [name] is a 82 y.o. female presenting on 07/26/2022 for Abdominal Pain (C/o sharp low abd pain, occasional low back pain at night and urinary urgency. Started 2-3 wks ago, worsening. )   3 wk h/o sharp lower abd and back pain associated with urinary urgency. Progressively worsening over the past week. Sharp pain to suprapubic area with urination as well as increased frequency and urgency. Some incomplete emptying.   No fevers/chills, nausea/vomiting, flank pain, diarrhea/constipation. No significant caffeine.  Good water intake.      Relevant past medical, surgical, family and social history reviewed and updated as indicated. Interim medical history since our last visit reviewed. Allergies and medications reviewed and updated. Outpatient Medications Prior to Visit  Medication Sig Dispense Refill   amLODipine (NORVASC) 5 MG tablet Take 1 tablet (5 mg total) by mouth daily. 90 tablet 4   atorvastatin (LIPITOR) 40 MG tablet Take 1 tablet (40 mg total) by mouth daily. 90 tablet 4   bismuth-metronidazole-tetracycline (PYLERA) 140-125-125 MG capsule Take 3 capsules by mouth 4 (four) times daily -  before meals and at bedtime. 120 capsule 0   Blood Glucose Monitoring Suppl (ONETOUCH VERIO) w/Device KIT Use to check blood sugar up to 4 times a day as directed 1 kit 0   Cholecalciferol (VITAMIN D) 2000 units CAPS Take 2,000 Units by mouth daily.     clopidogrel (PLAVIX) 75 MG tablet Take 1 tablet (75 mg total) by mouth every morning. 90 tablet 4   Colchicine (MITIGARE) 0.6 MG CAPS Take 1 tablet by mouth daily as needed (for gout). 30 capsule 3   COMFORT EZ PEN NEEDLES  31G X 5 MM MISC USE ONCE daily 100 each 3   famotidine (PEPCID) 20 MG tablet Take 1 tablet (20 mg total) by mouth at bedtime.     glucose blood (ONETOUCH VERIO) test strip USE TO check blood glucose AS DIRECTED four times daily 100 strip 3   hydrALAZINE (APRESOLINE) 50 MG tablet Take 1 tablet (50 mg total) by mouth in the morning and at bedtime. 180 tablet 4   hydrochlorothiazide (HYDRODIURIL) 12.5 MG tablet Take 1 tablet (12.5 mg total) by mouth every morning. 90 tablet 4   Insulin Glargine (BASAGLAR KWIKPEN) 100 UNIT/ML Inject 20 Units into the skin at bedtime. 15 mL 4   Lancets (ONETOUCH DELICA PLUS 123XX123) MISC Use as instructed to check blood sugar once a day 100 each 4   losartan (COZAAR) 100 MG tablet Take 1 tablet (100 mg total) by mouth every morning. 90 tablet 4   metoprolol succinate (TOPROL-XL) 25 MG 24 hr tablet Take 0.5 tablets (12.5 mg total) by mouth daily. 45 tablet 4   nitroGLYCERIN (NITROSTAT) 0.4 MG SL tablet Place 0.4 mg under the tongue every 5 (five) minutes as needed for chest pain.     nystatin cream (MYCOSTATIN) Apply 1 application topically 2 (two) times daily. (Patient taking differently: Apply 1 application  topically daily as needed for dry skin (rash).) 30 g 0   potassium chloride (  KLOR-CON) 10 MEQ tablet Take 1 tablet (10 mEq total) by mouth every Monday, Wednesday, and Friday. 40 tablet 4   No facility-administered medications prior to visit.     Per HPI unless specifically indicated in ROS section below Review of Systems  Objective:  BP 136/64   Pulse 79   Temp (!) 97.2 F (36.2 C) (Temporal)   Ht 5' 1.25" (1.556 m)   Wt 187 lb 6 oz (85 kg)   SpO2 98%   BMI 35.12 kg/m   Wt Readings from Last 3 Encounters:  07/26/22 187 lb 6 oz (85 kg)  06/07/22 187 lb 4 oz (84.9 kg)  04/13/22 192 lb (87.1 kg)      Physical Exam Vitals and nursing note reviewed.  Constitutional:      Appearance: Normal appearance. She is not ill-appearing.  HENT:      Mouth/Throat:     Mouth: Mucous membranes are moist.     Pharynx: Oropharynx is clear. No oropharyngeal exudate or posterior oropharyngeal erythema.  Cardiovascular:     Rate and Rhythm: Normal rate and regular rhythm.     Pulses: Normal pulses.     Heart sounds: Normal heart sounds. No murmur heard. Pulmonary:     Effort: Pulmonary effort is normal. No respiratory distress.     Breath sounds: Normal breath sounds. No wheezing, rhonchi or rales.  Abdominal:     General: Bowel sounds are normal. There is no distension.     Palpations: Abdomen is soft. There is no mass.     Tenderness: There is no abdominal tenderness. There is no right CVA tenderness, left CVA tenderness, guarding or rebound. Negative signs include Murphy's sign.     Hernia: No hernia is present.  Musculoskeletal:     Comments: Wearing stockings  Skin:    General: Skin is warm and dry.     Findings: No rash.  Neurological:     Mental Status: She is alert.  Psychiatric:        Mood and Affect: Mood normal.        Behavior: Behavior normal.     Results for orders placed or performed in visit on 07/26/22  POCT Urinalysis Dipstick (Automated)  Result Value Ref Range   Color, UA yellow    Clarity, UA cloudy    Glucose, UA Negative Negative   Bilirubin, UA negative    Ketones, UA negative    Spec Grav, UA 1.010 1.010 - 1.025   Blood, UA negative    pH, UA 6.0 5.0 - 8.0   Protein, UA Positive (A) Negative   Urobilinogen, UA 0.2 0.2 or 1.0 E.U./dL   Nitrite, UA negative    Leukocytes, UA Large (3+) (A) Negative      Lab Results  Component Value Date   HGBA1C 9.7 (H) 06/07/2022   Basaglar recently increased to 20u daily.  Lab Results  Component Value Date   CREATININE 1.14 06/07/2022   BUN 17 06/07/2022   NA 140 06/07/2022   K 4.1 06/07/2022   CL 102 06/07/2022   CO2 31 06/07/2022   GFR = 45  Assessment & Plan:   Problem List Items Addressed This Visit     Type 2 diabetes mellitus with  hyperglycemia   UTI (urinary tract infection) - Primary    Symptoms consistent with UTI.  UA today suspicious of same.  Rx keflex antibiotic. H/o PCN allergy (rash).  UCx sent. Update if not improving with treatment.  She is diabetic with  latest A1c uncontrolled.       Relevant Medications   cephALEXin (KEFLEX) 500 MG capsule   Other Relevant Orders   Urine Culture   Other Visit Diagnoses     Lower abdominal pain       Relevant Orders   POCT Urinalysis Dipstick (Automated) (Completed)        Meds ordered this encounter  Medications   cephALEXin (KEFLEX) 500 MG capsule    Sig: Take 1 capsule (500 mg total) by mouth 2 (two) times daily.    Dispense:  14 capsule    Refill:  0    Orders Placed This Encounter  Procedures   Urine Culture   POCT Urinalysis Dipstick (Automated)    Patient Instructions  You have a UTI - treat with keflex antibiotic twice daily for 1 week.  Push fluids and rest, may take tylenol for discomfort Let us know if not improving with treatment.  Good to see you today.   Follow up plan: Return if symptoms worsen or fail to improve.  [name], MD

## 2022-07-26 NOTE — Addendum Note (Signed)
Addended by: [name] on: 07/26/2022 02:13 PM   Modules accepted: Level of Service

## 2022-07-26 NOTE — Patient Instructions (Signed)
You have a UTI - treat with keflex antibiotic twice daily for 1 week.  Push fluids and rest, may take tylenol for discomfort Let us know if not improving with treatment.  Good to see you today.

## 2022-07-26 NOTE — Assessment & Plan Note (Addendum)
Symptoms consistent with UTI.  UA today suspicious of same.  Rx keflex antibiotic. H/o PCN allergy (rash).  UCx sent. Update if not improving with treatment.  She is diabetic with latest A1c uncontrolled.

## 2022-07-28 ENCOUNTER — Ambulatory Visit: Payer: Medicare Other | Admitting: Primary Care

## 2022-07-28 ENCOUNTER — Telehealth: Payer: Self-pay

## 2022-07-28 ENCOUNTER — Ambulatory Visit: Payer: Medicare Other | Admitting: Family Medicine

## 2022-07-28 MED ORDER — NITROFURANTOIN MONOHYD MACRO 100 MG PO CAPS: 100.0000 mg | ORAL_CAPSULE | Freq: Two times a day (BID) | ORAL | 0 refills | Status: AC

## 2022-07-28 NOTE — Telephone Encounter (Signed)
Noted. Will evaluate. Looks like PCP responded.

## 2022-07-28 NOTE — Telephone Encounter (Signed)
Agree - stop keflex, start macrobid sent to [facility].

## 2022-07-28 NOTE — Addendum Note (Signed)
Addended by: Eustaquio Boyden on: 07/28/2022 11:21 AM   Modules accepted: Orders

## 2022-07-28 NOTE — Telephone Encounter (Signed)
[name] at front desk said pt cancelled appt with Allayne Gitelman NP 07/28/22 due to no transportation. I spoke with pt and she was able to keep lunch down and pt said she is feeling a lot better. Pt does not have a way to ck BP. Advised pt per Dr Timoteo Expose note and pt voiced understanding and will start macrobid when delivered by [facility]. UC & ED precautions given and pt voiced understanding. [facility] CMA said Mardella Layman pharmacist said if abx was sent to [facility] before 12 noon med should be delivered today. Unable to reach [facility] on phone for verification. Pt said she would cb next wk with update.Sending FYI to Dr Reece Agar and Allayne Gitelman NP.

## 2022-07-28 NOTE — Progress Notes (Signed)
Care Management & Coordination Services Pharmacy Team  Reason for Encounter: Reaction to Medication    Spoke with patient on 07/28/2022   Patient contacted me to discuss current symptoms of nausea,vomit, weakness due to taking antibiotic Keflex delivered by [facility] to the patient yesterday. She contemplated going to ED and decided to reach out to our [facility] team. Will send to PCP and follow up with patient. Informed patient to stay hydrated and rest until she gets some guidance.   Al Corpus, PharmD notified  Burt Knack, [facility] Clinical Pharmacy Assistant [phone]

## 2022-07-28 NOTE — Telephone Encounter (Addendum)
Patient appears to be having reaction to Keflex - nausea, vomiting, weakness. Recommend she stop taking it immediately.   Reviewed urine culture, positive for E.coli. Susceptibilites not yet available. Macrobid has excellent coverage for E.coli generally, recommend to switch to Macrobid.  If eRx is sent before noon, [facility] will be able to deliver today. Otherwise please send Rx to local pharmacy for patient to pick up.  Routing to triage in case patient needs to be seen again.

## 2022-07-28 NOTE — Telephone Encounter (Signed)
Pt said N&V&weakness started after taking Keflex on 07/27/22. Pt only took one dose of Keflex but has not taken since then. Pt said last time vomited was earlier this morning; so far pt can only keep down few crackers and gingerale. Pt said last night her neck had swelling and was difficult to swallow; no difficulty in breathing; pt did not go to ED. This morning pt said she feels better than last night; pt said no swelling in neck today. Pt urinated about 1 hr ago and was normal amt voided but pt is not sure what color urine was. Pt said earlier this morning had dizziness and dry mouth but not now. Pt said she still feels extremely weak and pt scheduled appt with Allayne Gitelman NP 07/28/22 at 2:40; this was first appt pt could schedule due to finding transportation to office. UC & ED precautions given and pt voiced understanding. Sending note to Allayne Gitelman NP and [facility] pool. Added Keflex to drug allergy list.

## 2022-07-28 NOTE — Telephone Encounter (Signed)
Noted.  Glad she is feeling better.

## 2022-07-29 LAB — URINE CULTURE
MICRO NUMBER:: 14777615
SPECIMEN QUALITY:: ADEQUATE

## 2022-08-24 ENCOUNTER — Telehealth: Payer: Self-pay

## 2022-08-24 NOTE — Progress Notes (Signed)
Care Management & Coordination Services Pharmacy Team  Reason for Encounter: Medication coordination and delivery  Contacted patient to discuss medications and coordinate delivery from [facility]. Spoke with patient on 08/25/2022  Cycle dispensing form sent to [facility]  for review.   Last adherence delivery date:08/03/22      Patient is due for next adherence delivery on: 09/05/22  This delivery to include: Adherence Packaging  30 Days  Atorvastatin 40 mg 1 tablet daily (breakfast) Metoprolol Succinate 25 mg take 1/2  tablet daily (breakfast)  Hydralazine 50 mg 1 tablet twice daily (1-breakfast, 1-bedtime) Clopidogrel 75 mg 1 tablet daily (breakfast) Potassium chloride ER 10 Meq 1 tablet daily on Mon, Wed, Friday (breakfast) Amlodipine 5 mg 1 tablet daily (breakfast) Losartan 100 mg 1 tablet daily (breakfast Hydrochlorothiazide 12.5 mg 1 tablet daily (breakfast)  Patient declined the following medications this month: Pen needles- supply on hand Lancets- supply on hand   Refills requested from providers include: Hydralazine,losartan,potassium chloride  Confirmed delivery date of 09/04/22, advised patient that pharmacy will contact them the morning of delivery.   Any concerns about your medications? No  How often do you forget or accidentally miss a dose? Never  Do you use a pillbox? No  Is patient in packaging Yes  What is the date on your next pill pack?unable to find glasses to read   Any concerns or issues with your packaging? 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   Recent blood pressure readings are as follows:none available   Recent blood glucose readings are as follows:none available    Chart review: Recent office visits:  None since last contact  Recent consult visits:   None since last contact  Hospital visits:  None in previous 6 months  Medications: Outpatient Encounter Medications as of 08/24/2022  Medication Sig   amLODipine (NORVASC) 5 MG tablet Take 1 tablet (5 mg total) by mouth daily.   atorvastatin (LIPITOR) 40 MG tablet Take 1 tablet (40 mg total) by mouth daily.   bismuth-metronidazole-tetracycline (PYLERA) 140-125-125 MG capsule Take 3 capsules by mouth 4 (four) times daily -  before meals and at bedtime.   Blood Glucose Monitoring Suppl (ONETOUCH VERIO) w/Device KIT Use to check blood sugar up to 4 times a day as directed   Cholecalciferol (VITAMIN D) 2000 units CAPS Take 2,000 Units by mouth daily.   clopidogrel (PLAVIX) 75 MG tablet Take 1 tablet (75 mg total) by mouth every morning.   Colchicine (MITIGARE) 0.6 MG CAPS Take 1 tablet by mouth daily as needed (for gout).   COMFORT EZ PEN NEEDLES 31G X 5 MM MISC USE ONCE daily   famotidine (PEPCID) 20 MG tablet Take 1 tablet (20 mg total) by mouth at bedtime.   glucose blood (ONETOUCH VERIO) test strip USE TO check blood glucose AS DIRECTED four times daily   hydrALAZINE (APRESOLINE) 50 MG tablet Take 1 tablet (50 mg total) by mouth in the morning and at bedtime.   hydrochlorothiazide (HYDRODIURIL) 12.5 MG tablet Take 1 tablet (12.5 mg total) by mouth every morning.   Insulin Glargine (BASAGLAR KWIKPEN) 100 UNIT/ML Inject 20 Units into the skin at bedtime.   Lancets (ONETOUCH DELICA PLUS LANCET33G) MISC Use as instructed to check blood sugar once a day   losartan (COZAAR) 100 MG  tablet Take 1 tablet (100 mg total) by mouth every morning.   metoprolol succinate (TOPROL-XL) 25 MG 24 hr tablet Take 0.5 tablets (12.5 mg total) by mouth daily.   nitroGLYCERIN (NITROSTAT) 0.4 MG SL tablet Place 0.4 mg under the tongue every 5 (five) minutes as needed for chest pain.   nystatin cream (MYCOSTATIN) Apply 1 application topically 2 (two) times daily. (Patient taking differently: Apply 1 application   topically daily as needed for dry skin (rash).)   potassium chloride (KLOR-CON) 10 MEQ tablet Take 1 tablet (10 mEq total) by mouth every Monday, Wednesday, and Friday.   No facility-administered encounter medications on file as of 08/24/2022.   BP Readings from Last 3 Encounters:  07/26/22 136/64  06/07/22 (!) 154/84  01/20/22 (!) 148/96    Pulse Readings from Last 3 Encounters:  07/26/22 79  06/07/22 75  01/20/22 78    Lab Results  Component Value Date/Time   HGBA1C 9.7 (H) 06/07/2022 10:39 AM   HGBA1C 8.4 (A) 12/05/2021 10:28 AM   HGBA1C 9.2 (H) 05/30/2021 10:46 AM   Lab Results  Component Value Date   CREATININE 1.14 06/07/2022   BUN 17 06/07/2022   GFR 45.36 (L) 06/07/2022   GFRNONAA 53 (L) 05/30/2021   GFRAA >60 02/20/2018   NA 140 06/07/2022   K 4.1 06/07/2022   CALCIUM 9.6 06/07/2022   CO2 31 06/07/2022     Al Corpus, PharmD notified  Burt Knack, [facility] Clinical Pharmacy Assistant [phone]

## 2022-09-13 ENCOUNTER — Encounter: Payer: Self-pay | Admitting: Family Medicine

## 2022-09-13 ENCOUNTER — Ambulatory Visit (INDEPENDENT_AMBULATORY_CARE_PROVIDER_SITE_OTHER): Payer: Medicare Other | Admitting: Family Medicine

## 2022-09-13 VITALS — BP 140/78 | HR 78 | Temp 97.3°F | Ht 61.25 in | Wt 184.4 lb

## 2022-09-13 DIAGNOSIS — E1159 Type 2 diabetes mellitus with other circulatory complications: Secondary | ICD-10-CM | POA: Diagnosis not present

## 2022-09-13 DIAGNOSIS — E1165 Type 2 diabetes mellitus with hyperglycemia: Secondary | ICD-10-CM

## 2022-09-13 DIAGNOSIS — K769 Liver disease, unspecified: Secondary | ICD-10-CM | POA: Diagnosis not present

## 2022-09-13 DIAGNOSIS — E113392 Type 2 diabetes mellitus with moderate nonproliferative diabetic retinopathy without macular edema, left eye: Secondary | ICD-10-CM

## 2022-09-13 DIAGNOSIS — Z794 Long term (current) use of insulin: Secondary | ICD-10-CM | POA: Diagnosis not present

## 2022-09-13 DIAGNOSIS — Z8619 Personal history of other infectious and parasitic diseases: Secondary | ICD-10-CM

## 2022-09-13 DIAGNOSIS — K862 Cyst of pancreas: Secondary | ICD-10-CM

## 2022-09-13 LAB — POCT GLYCOSYLATED HEMOGLOBIN (HGB A1C): Hemoglobin A1C: 7.6 % — AB (ref 4.0–5.6)

## 2022-09-13 NOTE — Progress Notes (Signed)
Ph: [phone] Fax: [phone]   Patient ID: Kristen [name], female    DOB: 08-03-1940, 82 y.o.   MRN: [id number]  This visit was conducted in person.  BP (!) 140/78   Pulse 78   Temp (!) 97.3 F (36.3 C) (Temporal)   Ht 5' 1.25" (1.556 m)   Wt 184 lb 6 oz (83.6 kg)   SpO2 98%   BMI 34.55 kg/m    CC:  3 mo DM f/u visit  Subjective:   HPI: Kristen [name] is a 82 y.o. female presenting on 09/13/2022 for Medical Management of Chronic Issues (Here for 3 mo DM f/u.)   UTI last month - keflex caused nausea/vomiting - now on allergy list, tolerated macrobid better. Symptoms fully resolved.   CT abd/pelvis, MRI abdomen - benign appearing pancreatic cysts with mild pancreatic ductal dilation, as well as liver changes consistent with early cirrhosis.   H/o H pylori on colonoscopy 07/2020 treated with Pylera + protonix 20mg  BID x10d. Rpt H pylori stool antigen test returned negative 10/2020.   DM - does regularly check sugars twice weekly - fasting 100-110, pm 130-140. Compliant with antihyperglycemic regimen which includes: basaglar 20u daily. Denies low sugars or hypoglycemic symptoms. Denies paresthesias, blurry vision. Last diabetic eye exam 04/2021 - upcoming appt next month. Glucometer brand: one touch delica plus. Declines continuous glucose monitor. Last foot exam: 11/2021. DSME: declined. Lab Results  Component Value Date   HGBA1C 7.6 (A) 09/13/2022   Diabetic Foot Exam - Simple   No data filed    Lab Results  Component Value Date   MICROALBUR 11.5 (H) 05/30/2021         Relevant past medical, surgical, family and social history reviewed and updated as indicated. Interim medical history since our last visit reviewed. Allergies and medications reviewed and updated. Outpatient Medications Prior to Visit  Medication Sig Dispense Refill   amLODipine (NORVASC) 5 MG tablet Take 1 tablet (5 mg total) by mouth daily. 90 tablet 4   atorvastatin (LIPITOR) 40 MG tablet Take  1 tablet (40 mg total) by mouth daily. 90 tablet 4   Blood Glucose Monitoring Suppl (ONETOUCH VERIO) w/Device KIT Use to check blood sugar up to 4 times a day as directed 1 kit 0   Cholecalciferol (VITAMIN D) 2000 units CAPS Take 2,000 Units by mouth daily.     clopidogrel (PLAVIX) 75 MG tablet Take 1 tablet (75 mg total) by mouth every morning. 90 tablet 4   Colchicine (MITIGARE) 0.6 MG CAPS Take 1 tablet by mouth daily as needed (for gout). 30 capsule 3   COMFORT EZ PEN NEEDLES 31G X 5 MM MISC USE ONCE daily 100 each 3   famotidine (PEPCID) 20 MG tablet Take 1 tablet (20 mg total) by mouth at bedtime.     glucose blood (ONETOUCH VERIO) test strip USE TO check blood glucose AS DIRECTED four times daily 100 strip 3   hydrALAZINE (APRESOLINE) 50 MG tablet Take 1 tablet (50 mg total) by mouth in the morning and at bedtime. 180 tablet 4   hydrochlorothiazide (HYDRODIURIL) 12.5 MG tablet Take 1 tablet (12.5 mg total) by mouth every morning. 90 tablet 4   Insulin Glargine (BASAGLAR KWIKPEN) 100 UNIT/ML Inject 20 Units into the skin at bedtime. 15 mL 4   Lancets (ONETOUCH DELICA PLUS LANCET33G) MISC Use as instructed to check blood sugar once a day 100 each 4   losartan (COZAAR) 100 MG tablet Take 1 tablet (100 mg  total) by mouth every morning. 90 tablet 4   metoprolol succinate (TOPROL-XL) 25 MG 24 hr tablet Take 0.5 tablets (12.5 mg total) by mouth daily. 45 tablet 4   nitroGLYCERIN (NITROSTAT) 0.4 MG SL tablet Place 0.4 mg under the tongue every 5 (five) minutes as needed for chest pain.     nystatin cream (MYCOSTATIN) Apply 1 application topically 2 (two) times daily. (Patient taking differently: Apply 1 application  topically daily as needed for dry skin (rash).) 30 g 0   potassium chloride (KLOR-CON) 10 MEQ tablet Take 1 tablet (10 mEq total) by mouth every Monday, Wednesday, and Friday. 40 tablet 4   bismuth-metronidazole-tetracycline (PYLERA) 140-125-125 MG capsule Take 3 capsules by mouth 4  (four) times daily -  before meals and at bedtime. 120 capsule 0   No facility-administered medications prior to visit.     Per HPI unless specifically indicated in ROS section below Review of Systems  Objective:  BP (!) 140/78   Pulse 78   Temp (!) 97.3 F (36.3 C) (Temporal)   Ht 5' 1.25" (1.556 m)   Wt 184 lb 6 oz (83.6 kg)   SpO2 98%   BMI 34.55 kg/m   Wt Readings from Last 3 Encounters:  09/13/22 184 lb 6 oz (83.6 kg)  07/26/22 187 lb 6 oz (85 kg)  06/07/22 187 lb 4 oz (84.9 kg)      Physical Exam Vitals and nursing note reviewed.  Constitutional:      Appearance: Normal appearance. She is not ill-appearing.  HENT:     Head: Normocephalic and atraumatic.  Eyes:     Extraocular Movements: Extraocular movements intact.     Conjunctiva/sclera: Conjunctivae normal.     Pupils: Pupils are equal, round, and reactive to light.  Cardiovascular:     Rate and Rhythm: Normal rate and regular rhythm.     Pulses: Normal pulses.     Heart sounds: Normal heart sounds. No murmur heard. Pulmonary:     Effort: Pulmonary effort is normal. No respiratory distress.     Breath sounds: Normal breath sounds. No wheezing, rhonchi or rales.  Musculoskeletal:     Right lower leg: No edema.     Left lower leg: No edema.     Comments: See HPI for foot exam if performed.   Skin:    General: Skin is warm and dry.     Findings: No rash.  Neurological:     Mental Status: She is alert.     Comments:  CN 2-12 intact EOMI FTN intact  Psychiatric:        Mood and Affect: Mood normal.        Behavior: Behavior normal.       Results for orders placed or performed in visit on 09/13/22  POCT glycosylated hemoglobin (Hb A1C)  Result Value Ref Range   Hemoglobin A1C 7.6 (A) 4.0 - 5.6 %   HbA1c POC (<> result, manual entry)     HbA1c, POC (prediabetic range)     HbA1c, POC (controlled diabetic range)     Lab Results  Component Value Date   VITAMINB12 354 06/07/2022    Lab Results   Component Value Date   CREATININE 1.14 06/07/2022   BUN 17 06/07/2022   NA 140 06/07/2022   K 4.1 06/07/2022   CL 102 06/07/2022   CO2 31 06/07/2022    Assessment & Plan:   Problem List Items Addressed This Visit     Type 2 diabetes  mellitus with hyperglycemia (HCC) - Primary    Chronic, improved on basaglar 20u daily - will continue this.  Goal A1c <8% given age.  Encouraged continued efforts towards walking regularly and low carb /sugar diabetic diet.  Discussed CGM - she is hesitant but will consider this. Advised we can provide with sample for her to try.       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       Pancreas cyst    Benign appearing on MRI.       Liver disease    Discussed recent imaging findings suspicious for early cirrhosis, likely related to fatty liver. No significant alcohol history. Will continue to monitor this.       History of Helicobacter pylori infection     No orders of the defined types were placed in this encounter.   Orders Placed This Encounter  Procedures   POCT glycosylated hemoglobin (Hb A1C)    Patient Instructions  Consider continuous glucose monitor to check sugars more easily throughout the day.  Sugars are doing better! Continue walking regularly, continue higher dose of basaglar 20 units daily. Keep up the good work.  Return in 3 months for diabetes follow up  Follow up plan: Return in about 3 months (around 12/14/2022) for follow up visit.  Eustaquio Boyden, MD

## 2022-09-13 NOTE — Patient Instructions (Addendum)
Consider continuous glucose monitor to check sugars more easily throughout the day.  Sugars are doing better! Continue walking regularly, continue higher dose of basaglar 20 units daily. Keep up the good work.  Return in 3 months for diabetes follow up

## 2022-09-13 NOTE — Assessment & Plan Note (Signed)
Upcoming eye appt next month.  

## 2022-09-13 NOTE — Assessment & Plan Note (Signed)
Benign appearing on MRI.

## 2022-09-13 NOTE — Assessment & Plan Note (Signed)
Discussed recent imaging findings suspicious for early cirrhosis, likely related to fatty liver. No significant alcohol history. Will continue to monitor this.

## 2022-09-13 NOTE — Assessment & Plan Note (Addendum)
Chronic, improved on basaglar 20u daily - will continue this.  Goal A1c <8% given age.  Encouraged continued efforts towards walking regularly and low carb /sugar diabetic diet.  Discussed CGM - she is hesitant but will consider this. Advised we can provide with sample for her to try.

## 2022-09-19 ENCOUNTER — Ambulatory Visit: Payer: Self-pay

## 2022-09-19 NOTE — Patient Outreach (Signed)
  Care Coordination   09/19/2022 Name: Kristen [name] MRN: [id number] DOB: 1940/05/28   Care Coordination Outreach Attempts:  An unsuccessful telephone outreach was attempted for a scheduled appointment today. Unable to leave voice message due to phone only ringing.  Attempted call back to listed home/ mobile number x 2.   Follow Up Plan:  Additional outreach attempts will be made to offer the patient care coordination information and services.   Encounter Outcome:  No Answer   Care Coordination Interventions:  No, not indicated    [name] [facility] Care Coordination [phone] direct line

## 2022-09-25 ENCOUNTER — Telehealth: Payer: Self-pay

## 2022-09-25 NOTE — Progress Notes (Cosign Needed Addendum)
Care Management & Coordination Services Pharmacy Team  Reason for Encounter: Medication coordination and delivery  Contacted patient to discuss medications and coordinate delivery from [facility]. Spoke with patient on 09/26/2022  Cycle dispensing form sent to [facility]  for review.   Last adherence delivery date:09/05/22      Patient is due for next adherence delivery on: 10/03/22  This delivery to include: Adherence Packaging  30 Days  Atorvastatin 40 mg 1 tablet daily (breakfast) Metoprolol Succinate 25 mg take 1/2  tablet daily (breakfast)  Hydralazine 50 mg 1 tablet twice daily (1-breakfast, 1-bedtime) Clopidogrel 75 mg 1 tablet daily (breakfast) Potassium chloride ER 10 Meq 1 tablet daily on Mon, Wed, Friday (breakfast) Amlodipine 5 mg 1 tablet daily (breakfast) Losartan 100 mg 1 tablet daily (breakfast Hydrochlorothiazide 12.5 mg 1 tablet daily (breakfast)  Patient declined the following medications this month: Pen needles- supply on hand Lancets- supply on hand   No refill request needed.  Confirmed delivery date of 10/03/22, advised patient that pharmacy will contact them the morning of delivery.   Any concerns about your medications?  Patient called in to ask for This was added to order for the patient for $12.  How often do you forget or accidentally miss a dose? Never  Do you use a pillbox? No  Is patient in packaging Yes  Any concerns or issues with your packaging? satisfied   Recent blood pressure readings are as follows:none available  Recent blood glucose readings are as follows: none available    Chart review: Recent office visits:  5/22/24Wynona Canes Gutierrez,MD(PCP)- f/u chronic, DM foot UTD, A1c 7.6,f/u 3 months  Recent consult visits:  None since last contact   Hospital visits:  None in previous 6 months  Medications: Outpatient Encounter Medications as of 09/25/2022  Medication Sig   amLODipine (NORVASC) 5 MG tablet Take 1 tablet  (5 mg total) by mouth daily.   atorvastatin (LIPITOR) 40 MG tablet Take 1 tablet (40 mg total) by mouth daily.   Blood Glucose Monitoring Suppl (ONETOUCH VERIO) w/Device KIT Use to check blood sugar up to 4 times a day as directed   Cholecalciferol (VITAMIN D) 2000 units CAPS Take 2,000 Units by mouth daily.   clopidogrel (PLAVIX) 75 MG tablet Take 1 tablet (75 mg total) by mouth every morning.   Colchicine (MITIGARE) 0.6 MG CAPS Take 1 tablet by mouth daily as needed (for gout).   COMFORT EZ PEN NEEDLES 31G X 5 MM MISC USE ONCE daily   famotidine (PEPCID) 20 MG tablet Take 1 tablet (20 mg total) by mouth at bedtime.   glucose blood (ONETOUCH VERIO) test strip USE TO check blood glucose AS DIRECTED four times daily   hydrALAZINE (APRESOLINE) 50 MG tablet Take 1 tablet (50 mg total) by mouth in the morning and at bedtime.   hydrochlorothiazide (HYDRODIURIL) 12.5 MG tablet Take 1 tablet (12.5 mg total) by mouth every morning.   Insulin Glargine (BASAGLAR KWIKPEN) 100 UNIT/ML Inject 20 Units into the skin at bedtime.   Lancets (ONETOUCH DELICA PLUS LANCET33G) MISC Use as instructed to check blood sugar once a day   losartan (COZAAR) 100 MG tablet Take 1 tablet (100 mg total) by mouth every morning.   metoprolol succinate (TOPROL-XL) 25 MG 24 hr tablet Take 0.5 tablets (12.5 mg total) by mouth daily.   nitroGLYCERIN (NITROSTAT) 0.4 MG SL tablet Place 0.4 mg under the tongue every 5 (five) minutes as needed for chest pain.   nystatin cream (MYCOSTATIN)  Apply 1 application topically 2 (two) times daily. (Patient taking differently: Apply 1 application  topically daily as needed for dry skin (rash).)   potassium chloride (KLOR-CON) 10 MEQ tablet Take 1 tablet (10 mEq total) by mouth every Monday, Wednesday, and Friday.   No facility-administered encounter medications on file as of 09/25/2022.   BP Readings from Last 3 Encounters:  09/13/22 (!) 140/78  07/26/22 136/64  06/07/22 (!) 154/84    Pulse  Readings from Last 3 Encounters:  09/13/22 78  07/26/22 79  06/07/22 75    Lab Results  Component Value Date/Time   HGBA1C 7.6 (A) 09/13/2022 09:57 AM   HGBA1C 9.7 (H) 06/07/2022 10:39 AM   HGBA1C 8.4 (A) 12/05/2021 10:28 AM   HGBA1C 9.2 (H) 05/30/2021 10:46 AM   Lab Results  Component Value Date   CREATININE 1.14 06/07/2022   BUN 17 06/07/2022   GFR 45.36 (L) 06/07/2022   GFRNONAA 53 (L) 05/30/2021   GFRAA >60 02/20/2018   NA 140 06/07/2022   K 4.1 06/07/2022   CALCIUM 9.6 06/07/2022   CO2 31 06/07/2022     Al Corpus, PharmD notified  Burt Knack, [facility] Clinical Pharmacy Assistant [phone]

## 2022-10-16 ENCOUNTER — Ambulatory Visit: Payer: Self-pay

## 2022-10-16 NOTE — Patient Instructions (Signed)
Visit Information  Thank you for taking time to visit with me today. Please don't hesitate to contact me if I can be of assistance to you.   Following are the goals we discussed today:   Goals Addressed             This Visit's Progress    COMPLETED: Patient stated:  " Managing my diabetes and other health conditions       Interventions Today    Flowsheet Row Most Recent Value  Chronic Disease   Chronic disease during today's visit Diabetes, Hypertension (HTN)  General Interventions   General Interventions Discussed/Reviewed General Interventions Reviewed, Doctor Visits, Durable Medical Equipment (DME)  [Evaluation of current treatment plan related to diabetes/ hypertension and patients adherence to plan as established by provider.  Assess for home blood pressure and blood sugar readings.]  Doctor Visits Discussed/Reviewed Doctor Visits Reviewed  [name] scheduled/ upcoming provider appointments.]  Durable Medical Equipment (DME) Other  [Advised to purchase new blood pressure montor.  Advised to call her insurance provider to access OTC benefit.]  Exercise Interventions   Exercise Discussed/Reviewed Physical Activity  Physical Activity Discussed/Reviewed Physical Activity Reviewed  [Discussed importance of increasing physical activity as tolerated.]  Education Interventions   Education Provided Provided Education  Provided Verbal Education On Blood Sugar Monitoring, Other  [Reviewed normal blood pressure and blood sugar ranges]  Pharmacy Interventions   Pharmacy Dicussed/Reviewed Pharmacy Topics Reviewed  [Medications reviewed and compliance discussed.]                  Please call the care guide team at [phone] if you need to follow up further with RN care coordinator. Call your primary care provider for questions/ concerns.   If you are experiencing a Mental Health or Behavioral Health Crisis or need someone to talk to, please call the Suicide and Crisis  Lifeline: 988 call 1-800-273-TALK (toll free, 24 hour hotline)  The patient verbalized understanding of instructions, educational materials, and care plan provided today and agreed to receive a mailed copy of patient instructions, educational materials, and care plan.   [name] RN,BSN,CCM [facility] Care Coordination [phone] direct line

## 2022-10-16 NOTE — Patient Outreach (Signed)
  Care Coordination   Follow Up Visit Note   10/16/2022 Name: Kristen [name] MRN: [id number] DOB: 01-Sep-1940  Kristen [name] is a 82 y.o. year old female who sees Eustaquio Boyden, MD for primary care. I spoke with  Kristen [name] by phone today.  What matters to the patients health and wellness today?  Patient states she is doing well. She reports blood sugars range fro 100-140 fasting. She states her Hgb A1c has improved to 7.6.  Patient states she doesn't think she is checking her blood pressure correctly with her monitor. She states her granddaughter who is a nurse helps her monitor her blood pressure. She states she will take her blood pressure monitor to her next PCP appointment.  Patient states she has started walking 3 x per week.  She states she is eating more salads and watching what she eats more.  Patient states overall she is doing well. Patient denies any further needs or concerns and is agreeable that care coordination goals have been met.   Goals Addressed             This Visit's Progress    COMPLETED: Patient stated:  " Managing my diabetes and other health conditions       Interventions Today    Flowsheet Row Most Recent Value  Chronic Disease   Chronic disease during today's visit Diabetes, Hypertension (HTN)  General Interventions   General Interventions Discussed/Reviewed General Interventions Reviewed, Doctor Visits, Durable Medical Equipment (DME)  [Evaluation of current treatment plan related to diabetes/ hypertension and patients adherence to plan as established by provider.  Assess for home blood pressure and blood sugar readings.]  Doctor Visits Discussed/Reviewed Doctor Visits Reviewed  Kristen [name] scheduled/ upcoming provider appointments.]  Durable Medical Equipment (DME) Other  [Advised to purchase new blood pressure montor.  Advised to call her insurance provider to access OTC benefit.]  Exercise Interventions   Exercise Discussed/Reviewed Physical  Activity  Physical Activity Discussed/Reviewed Physical Activity Reviewed  [Discussed importance of increasing physical activity as tolerated.]  Education Interventions   Education Provided Provided Education  Provided Verbal Education On Blood Sugar Monitoring, Other  [Reviewed normal blood pressure and blood sugar ranges]  Pharmacy Interventions   Pharmacy Dicussed/Reviewed Pharmacy Topics Reviewed  [Medications reviewed and compliance discussed.]                  SDOH assessments and interventions completed:  No     Care Coordination Interventions:  Yes, provided   Follow up plan: No further intervention required.   Encounter Outcome:  Pt. Visit Completed   [name] RN,BSN,CCM [facility] Care Coordination [phone] direct line

## 2022-10-18 DIAGNOSIS — H02834 Dermatochalasis of left upper eyelid: Secondary | ICD-10-CM | POA: Diagnosis not present

## 2022-10-18 DIAGNOSIS — H26492 Other secondary cataract, left eye: Secondary | ICD-10-CM | POA: Diagnosis not present

## 2022-10-18 DIAGNOSIS — H02831 Dermatochalasis of right upper eyelid: Secondary | ICD-10-CM | POA: Diagnosis not present

## 2022-10-18 DIAGNOSIS — E119 Type 2 diabetes mellitus without complications: Secondary | ICD-10-CM | POA: Diagnosis not present

## 2022-10-18 DIAGNOSIS — Z961 Presence of intraocular lens: Secondary | ICD-10-CM | POA: Diagnosis not present

## 2022-10-18 DIAGNOSIS — H18421 Band keratopathy, right eye: Secondary | ICD-10-CM | POA: Diagnosis not present

## 2022-10-18 DIAGNOSIS — H04123 Dry eye syndrome of bilateral lacrimal glands: Secondary | ICD-10-CM | POA: Diagnosis not present

## 2022-10-18 LAB — HM DIABETES EYE EXAM

## 2022-10-23 ENCOUNTER — Encounter: Payer: Self-pay | Admitting: Family Medicine

## 2022-11-14 ENCOUNTER — Other Ambulatory Visit: Payer: Self-pay

## 2022-11-14 ENCOUNTER — Emergency Department: Payer: Medicare Other

## 2022-11-14 ENCOUNTER — Emergency Department
Admission: EM | Admit: 2022-11-14 | Discharge: 2022-11-14 | Disposition: A | Discharge status: Home / Self Care | Payer: Medicare Other | Attending: Emergency Medicine | Admitting: Emergency Medicine

## 2022-11-14 DIAGNOSIS — R079 Chest pain, unspecified: Secondary | ICD-10-CM

## 2022-11-14 DIAGNOSIS — R918 Other nonspecific abnormal finding of lung field: Secondary | ICD-10-CM | POA: Diagnosis not present

## 2022-11-14 DIAGNOSIS — E119 Type 2 diabetes mellitus without complications: Secondary | ICD-10-CM | POA: Insufficient documentation

## 2022-11-14 DIAGNOSIS — I6782 Cerebral ischemia: Secondary | ICD-10-CM | POA: Insufficient documentation

## 2022-11-14 DIAGNOSIS — M79622 Pain in left upper arm: Secondary | ICD-10-CM | POA: Insufficient documentation

## 2022-11-14 DIAGNOSIS — R0989 Other specified symptoms and signs involving the circulatory and respiratory systems: Secondary | ICD-10-CM | POA: Diagnosis not present

## 2022-11-14 DIAGNOSIS — M5011 Cervical disc disorder with radiculopathy,  high cervical region: Secondary | ICD-10-CM | POA: Diagnosis not present

## 2022-11-14 DIAGNOSIS — R0789 Other chest pain: Secondary | ICD-10-CM | POA: Diagnosis not present

## 2022-11-14 DIAGNOSIS — I69354 Hemiplegia and hemiparesis following cerebral infarction affecting left non-dominant side: Secondary | ICD-10-CM | POA: Diagnosis not present

## 2022-11-14 DIAGNOSIS — I10 Essential (primary) hypertension: Secondary | ICD-10-CM | POA: Diagnosis not present

## 2022-11-14 DIAGNOSIS — Z981 Arthrodesis status: Secondary | ICD-10-CM | POA: Diagnosis not present

## 2022-11-14 DIAGNOSIS — R0602 Shortness of breath: Secondary | ICD-10-CM | POA: Insufficient documentation

## 2022-11-14 DIAGNOSIS — M542 Cervicalgia: Secondary | ICD-10-CM | POA: Diagnosis not present

## 2022-11-14 DIAGNOSIS — I639 Cerebral infarction, unspecified: Secondary | ICD-10-CM | POA: Diagnosis not present

## 2022-11-14 LAB — CBC WITH DIFFERENTIAL/PLATELET
Abs Immature Granulocytes: 0.02 10*3/uL (ref 0.00–0.07)
Basophils Absolute: 0 10*3/uL (ref 0.0–0.1)
Basophils Relative: 0 %
Eosinophils Absolute: 0.1 10*3/uL (ref 0.0–0.5)
Eosinophils Relative: 1 %
HCT: 34.5 % — ABNORMAL LOW (ref 36.0–46.0)
Hemoglobin: 10.7 g/dL — ABNORMAL LOW (ref 12.0–15.0)
Immature Granulocytes: 0 %
Lymphocytes Relative: 29 %
Lymphs Abs: 2.1 10*3/uL (ref 0.7–4.0)
MCH: 27.1 pg (ref 26.0–34.0)
MCHC: 31 g/dL (ref 30.0–36.0)
MCV: 87.3 fL (ref 80.0–100.0)
Monocytes Absolute: 0.5 10*3/uL (ref 0.1–1.0)
Monocytes Relative: 7 %
Neutro Abs: 4.3 10*3/uL (ref 1.7–7.7)
Neutrophils Relative %: 63 %
Platelets: 234 10*3/uL (ref 150–400)
RBC: 3.95 MIL/uL (ref 3.87–5.11)
RDW: 14.5 % (ref 11.5–15.5)
WBC: 7 10*3/uL (ref 4.0–10.5)
nRBC: 0 % (ref 0.0–0.2)

## 2022-11-14 LAB — COMPREHENSIVE METABOLIC PANEL
ALT: 14 U/L (ref 0–44)
AST: 20 U/L (ref 15–41)
Albumin: 3.2 g/dL — ABNORMAL LOW (ref 3.5–5.0)
Alkaline Phosphatase: 81 U/L (ref 38–126)
Anion gap: 8 (ref 5–15)
BUN: 27 mg/dL — ABNORMAL HIGH (ref 8–23)
CO2: 25 mmol/L (ref 22–32)
Calcium: 9 mg/dL (ref 8.9–10.3)
Chloride: 105 mmol/L (ref 98–111)
Creatinine, Ser: 1.08 mg/dL — ABNORMAL HIGH (ref 0.44–1.00)
GFR, Estimated: 52 mL/min — ABNORMAL LOW (ref >60)
Glucose, Bld: 272 mg/dL — ABNORMAL HIGH (ref 70–99)
Potassium: 3 mmol/L — ABNORMAL LOW (ref 3.5–5.1)
Sodium: 138 mmol/L (ref 135–145)
Total Bilirubin: 0.5 mg/dL (ref 0.3–1.2)
Total Protein: 7.1 g/dL (ref 6.5–8.1)

## 2022-11-14 LAB — TROPONIN I (HIGH SENSITIVITY)
Troponin I (High Sensitivity): 33 ng/L — ABNORMAL HIGH (ref <18)
Troponin I (High Sensitivity): 34 ng/L — ABNORMAL HIGH (ref <18)

## 2022-11-14 LAB — BRAIN NATRIURETIC PEPTIDE: B Natriuretic Peptide: 62.5 pg/mL (ref 0.0–100.0)

## 2022-11-14 MED ORDER — CYCLOBENZAPRINE HCL 10 MG PO TABS
10.0000 mg | ORAL_TABLET | Freq: Once | ORAL | Status: AC
  Administered 2022-11-14: 10 mg via ORAL
  Filled 2022-11-14: qty 1

## 2022-11-14 MED ORDER — CYCLOBENZAPRINE HCL 5 MG PO TABS: 5.0000 mg | ORAL_TABLET | Freq: Three times a day (TID) | ORAL | 0 refills | Status: AC | PRN

## 2022-11-14 MED ORDER — HYDRALAZINE HCL 20 MG/ML IJ SOLN
20.0000 mg | Freq: Once | INTRAMUSCULAR | Status: AC
  Administered 2022-11-14: 20 mg via INTRAVENOUS
  Filled 2022-11-14: qty 1

## 2022-11-14 MED ORDER — AMLODIPINE BESYLATE 5 MG PO TABS
5.0000 mg | ORAL_TABLET | Freq: Once | ORAL | Status: AC
  Administered 2022-11-14: 5 mg via ORAL
  Filled 2022-11-14: qty 1

## 2022-11-14 MED ORDER — HYDRALAZINE HCL 50 MG PO TABS
50.0000 mg | ORAL_TABLET | Freq: Once | ORAL | Status: AC
  Administered 2022-11-14: 50 mg via ORAL
  Filled 2022-11-14: qty 1

## 2022-11-14 MED ORDER — CYCLOBENZAPRINE HCL 5 MG PO TABS: 5.0000 mg | ORAL_TABLET | Freq: Three times a day (TID) | ORAL | 0 refills | Status: DC | PRN

## 2022-11-14 MED ORDER — LOSARTAN POTASSIUM 50 MG PO TABS
100.0000 mg | ORAL_TABLET | Freq: Once | ORAL | Status: AC
  Administered 2022-11-14: 100 mg via ORAL
  Filled 2022-11-14: qty 2

## 2022-11-14 MED ORDER — METOPROLOL SUCCINATE ER 25 MG PO TB24
25.0000 mg | ORAL_TABLET | Freq: Every day | ORAL | Status: DC
  Administered 2022-11-14: 25 mg via ORAL
  Filled 2022-11-14: qty 1

## 2022-11-14 NOTE — Discharge Instructions (Addendum)
Please use ibuprofen (Motrin) up to 800 mg every 8 hours, naproxen (Naprosyn) up to 500 mg every 12 hours, and/or acetaminophen (Tylenol) up to 4 g/day for any continued pain.  Please do not use this medication regimen for longer than 7 days

## 2022-11-14 NOTE — ED Triage Notes (Addendum)
Pt arrives via POV. Pt reports shortness of breath since yesterday. States when she woke up this morning, she began experiencing left sided chest pain. Pt also reports she has been having pain in the left side of her neck for quite awhile. States she has residual left sided weakness from a stroke in the past. Pt is AxOx4.

## 2022-11-14 NOTE — ED Provider Notes (Signed)
[facility] Provider Note   Event Date/Time   First MD Initiated Contact with Patient 11/14/22 0740     (approximate) History  Chest Pain, Shortness of Breath, and Neck Pain  HPI Kristen [name] is a 82 y.o. female with a past medical history of chronic neck pain, obesity, and poorly controlled type 2 diabetes who presents complaining of left neck, left upper extremity, and left chest pain that began this morning and has been stable since onset.  Patient states that she has significant pain in her neck described as aching, 9/10, and radiating down into the left chest.  Patient denies any associated shortness of breath or dyspnea on exertion.  Patient denies any history of CAD and states she had a negative stress test in 2016 ROS: Patient currently denies any vision changes, tinnitus, difficulty speaking, facial droop, sore throat, shortness of breath, abdominal pain, nausea/vomiting/diarrhea, dysuria, or weakness/numbness/paresthesias in any extremity   Physical Exam  Triage Vital Signs: ED Triage Vitals  Encounter Vitals Group     BP      Systolic BP Percentile      Diastolic BP Percentile      Pulse      Resp      Temp      Temp src      SpO2      Weight      Height      Head Circumference      Peak Flow      Pain Score      Pain Loc      Pain Education      Exclude from Growth Chart    Most recent vital signs: Vitals:   11/14/22 1159 11/14/22 1204  BP: (!) 184/69 (!) 178/77  Pulse:  (!) 58  Resp:  13  Temp:    SpO2:  100%   General: Awake, oriented x4. CV:  Good peripheral perfusion.  Resp:  Normal effort.  Abd:  No distention.  Other:  Elderly overweight African-American female laying in bed in no acute distress. ED Results / Procedures / Treatments  Labs (all labs ordered are listed, but only abnormal results are displayed) Labs Reviewed  COMPREHENSIVE METABOLIC PANEL - Abnormal; Notable for the following components:      Result Value    Potassium 3.0 (*)    Glucose, Bld 272 (*)    BUN 27 (*)    Creatinine, Ser 1.08 (*)    Albumin 3.2 (*)    GFR, Estimated 52 (*)    All other components within normal limits  CBC WITH DIFFERENTIAL/PLATELET - Abnormal; Notable for the following components:   Hemoglobin 10.7 (*)    HCT 34.5 (*)    All other components within normal limits  TROPONIN I (HIGH SENSITIVITY) - Abnormal; Notable for the following components:   Troponin I (High Sensitivity) 33 (*)    All other components within normal limits  TROPONIN I (HIGH SENSITIVITY) - Abnormal; Notable for the following components:   Troponin I (High Sensitivity) 34 (*)    All other components within normal limits  BRAIN NATRIURETIC PEPTIDE   EKG ED ECG REPORT I, Merwyn Katos, the attending physician, personally viewed and interpreted this ECG. Date: 11/14/2022 EKG Time: 0808 Rate: 67 Rhythm: normal sinus rhythm QRS Axis: normal Intervals: normal ST/T Wave abnormalities: normal Narrative Interpretation: no evidence of acute ischemia RADIOLOGY ED MD interpretation: CT of the head without contrast interpreted by me shows no evidence of  acute abnormalities including no intracerebral hemorrhage, obvious masses, or significant edema  CT of the cervical spine interpreted by me does not show any evidence of acute abnormalities including no acute fracture, malalignment, height loss, or dislocation  One-view portable chest x-ray interpreted by me shows no evidence of acute abnormalities including no pneumonia, pneumothorax, or widened mediastinum -Agree with radiology assessment Official radiology report(s): CT Head Wo Contrast  Result Date: 11/14/2022 CLINICAL DATA:  Neuro deficit, acute, stroke suspected; Cervical radiculopathy, no red flags EXAM: CT HEAD WITHOUT CONTRAST CT CERVICAL SPINE WITHOUT CONTRAST TECHNIQUE: Multidetector CT imaging of the head and cervical spine was performed following the standard protocol without  intravenous contrast. Multiplanar CT image reconstructions of the cervical spine were also generated. RADIATION DOSE REDUCTION: This exam was performed according to the departmental dose-optimization program which includes automated exposure control, adjustment of the mA and/or kV according to patient size and/or use of iterative reconstruction technique. COMPARISON:  CT Head 03/15/20 FINDINGS: CT HEAD FINDINGS Brain: No evidence of acute infarction, hemorrhage, hydrocephalus, extra-axial collection or mass lesion/mass effect. Chronic appearing bilateral cerebellar infarcts. Sequela of moderate chronic microvascular ischemic change. Vascular: No hyperdense vessel or unexpected calcification. Skull: Normal. Negative for fracture or focal lesion. Sinuses/Orbits: No middle ear or mastoid effusion. Paranasal sinuses are clear. Bilateral lens replacement. Orbits are otherwise unremarkable. Other: None. CT CERVICAL SPINE FINDINGS Alignment: Trace retrolisthesis of C3 on C4 and C4 on C5. Skull base and vertebrae: No acute fracture. No primary bone lesion or focal pathologic process. Incomplete fusion of the posterior elements of C1. Soft tissues and spinal canal: No prevertebral fluid or swelling. No visible canal hematoma. Disc levels:  Moderate spinal canal narrowing at C3-C4 and C4-C5. Upper chest: Negative. Other: Vascular calcifications. IMPRESSION: 1. No acute intracranial abnormality. Chronic appearing bilateral cerebellar infarcts. 2. No acute fracture or traumatic malalignment of the cervical spine. Electronically Signed   By: Lorenza Cambridge M.D.   On: 11/14/2022 08:49   CT Cervical Spine Wo Contrast  Result Date: 11/14/2022 CLINICAL DATA:  Neuro deficit, acute, stroke suspected; Cervical radiculopathy, no red flags EXAM: CT HEAD WITHOUT CONTRAST CT CERVICAL SPINE WITHOUT CONTRAST TECHNIQUE: Multidetector CT imaging of the head and cervical spine was performed following the standard protocol without intravenous  contrast. Multiplanar CT image reconstructions of the cervical spine were also generated. RADIATION DOSE REDUCTION: This exam was performed according to the departmental dose-optimization program which includes automated exposure control, adjustment of the mA and/or kV according to patient size and/or use of iterative reconstruction technique. COMPARISON:  CT Head 03/15/20 FINDINGS: CT HEAD FINDINGS Brain: No evidence of acute infarction, hemorrhage, hydrocephalus, extra-axial collection or mass lesion/mass effect. Chronic appearing bilateral cerebellar infarcts. Sequela of moderate chronic microvascular ischemic change. Vascular: No hyperdense vessel or unexpected calcification. Skull: Normal. Negative for fracture or focal lesion. Sinuses/Orbits: No middle ear or mastoid effusion. Paranasal sinuses are clear. Bilateral lens replacement. Orbits are otherwise unremarkable. Other: None. CT CERVICAL SPINE FINDINGS Alignment: Trace retrolisthesis of C3 on C4 and C4 on C5. Skull base and vertebrae: No acute fracture. No primary bone lesion or focal pathologic process. Incomplete fusion of the posterior elements of C1. Soft tissues and spinal canal: No prevertebral fluid or swelling. No visible canal hematoma. Disc levels:  Moderate spinal canal narrowing at C3-C4 and C4-C5. Upper chest: Negative. Other: Vascular calcifications. IMPRESSION: 1. No acute intracranial abnormality. Chronic appearing bilateral cerebellar infarcts. 2. No acute fracture or traumatic malalignment of the cervical spine. Electronically Signed  By: Lorenza Cambridge M.D.   On: 11/14/2022 08:49   DG Chest Port 1 View  Result Date: 11/14/2022 CLINICAL DATA:  chest pain, SHOB EXAM: PORTABLE CHEST 1 VIEW COMPARISON:  CXR 03/16/20 FINDINGS: No pleural effusion. No pneumothorax. Unchanged cardiac and mediastinal contours. Low lung volumes. Hazy opacity at the right lung base could represent atelectasis or infection. No radiographically apparent  displaced rib fractures. Visualized upper abdomen unremarkable. Degenerative changes of the bilateral glenohumeral and AC joints. IMPRESSION: Hazy opacity at the right lung base could represent atelectasis or infection. Electronically Signed   By: Lorenza Cambridge M.D.   On: 11/14/2022 08:34   PROCEDURES: Critical Care performed: No .1-3 Lead EKG Interpretation  Performed by: Merwyn Katos, MD Authorized by: Merwyn Katos, MD     Interpretation: normal     ECG rate:  71   ECG rate assessment: normal     Rhythm: sinus rhythm     Ectopy: none     Conduction: normal    MEDICATIONS ORDERED IN ED: Medications  metoprolol succinate (TOPROL-XL) 24 hr tablet 25 mg (25 mg Oral Given 11/14/22 0947)  cyclobenzaprine (FLEXERIL) tablet 10 mg (10 mg Oral Given 11/14/22 0836)  amLODipine (NORVASC) tablet 5 mg (5 mg Oral Given 11/14/22 0946)  hydrALAZINE (APRESOLINE) tablet 50 mg (50 mg Oral Given 11/14/22 0947)  losartan (COZAAR) tablet 100 mg (100 mg Oral Given 11/14/22 0947)  hydrALAZINE (APRESOLINE) injection 20 mg (20 mg Intravenous Given 11/14/22 1159)   IMPRESSION / MDM / ASSESSMENT AND PLAN / ED COURSE  I reviewed the triage vital signs and the nursing notes.                             The patient is on the cardiac monitor to evaluate for evidence of arrhythmia and/or significant heart rate changes. Patient's presentation is most consistent with acute presentation with potential threat to life or bodily function. This patient presents with atypical chest pain, most likely secondary to musculoskeletal injury. Differential diagnosis includes rib fracture, costochondritis, sternal fracture. Low suspicion for ACS, acute PE (PERC negative), pericarditis / myocarditis, thoracic aortic dissection, pneumothorax, pneumonia or other acute infectious process. Presentation not consistent with other acute, emergent causes of chest pain at this time. No indication for cardiac enzyme testing. Plan to order CXR to  evaluate for acute cardiopulmonary causes.  Plan: CBC, troponin, EKG, CXR, pain control Troponin showing mild increase that is stable around previous troponin blood draws in the past.  On repeat patient's troponin only elevated by 2 and patient states that pain is now mostly in her left neck.  Patient agrees with plan for discharge home and follow-up with her primary care physician for further evaluation and management if symptoms persist. Rx: Flexeril 5 mg 3 times daily as needed Dispo: Discharge home with home care   FINAL CLINICAL IMPRESSION(S) / ED DIAGNOSES   Final diagnoses:  Neck pain  Left-sided chest pain  Hypertension, unspecified type   Rx / DC Orders   ED Discharge Orders          Ordered    cyclobenzaprine (FLEXERIL) 5 MG tablet  3 times daily PRN        11/14/22 1241           Note:  This document was prepared using Dragon voice recognition software and may include unintentional dictation errors.   Merwyn Katos, MD 11/14/22 [phone]

## 2022-11-20 ENCOUNTER — Ambulatory Visit: Payer: Self-pay

## 2022-11-20 ENCOUNTER — Telehealth: Payer: Self-pay

## 2022-11-20 NOTE — Chronic Care Management (AMB) (Signed)
   11/20/2022  Kristen [name] 1940-09-03 [id number]  Reason for Encounter: Patient is not currently enrolled in the CCM program. CCM enrollment status changed to Previously enrolled.    [facility]/Chronic Care Management [phone]

## 2022-11-20 NOTE — Transitions of Care (Post Inpatient/ED Visit) (Signed)
11/20/2022  Name: Kristen [name] MRN: [id number] DOB: [birth year]  Today's TOC FU Call Status: Today's TOC FU Call Status:: Successful TOC FU Call Competed TOC FU Call Complete Date: 11/20/22   Red on EMMI-ED Discharge Alert Date & Reason:11/16/22 "Scheduled follow-up appt? No"  Transition Care Management Follow-up Telephone Call Date of Discharge: 11/16/22 Discharge Facility: [facility]) Type of Discharge: Emergency Department Reason for ED Visit: Other: ("neck pain") How have you been since you were released from the hospital?: Better (Pt pleased to report that "pain went away after taking those meds for two days." She voices no further pain or issues. She is back to her "normal self and doing everything for herself.") Any questions or concerns?: No  Items Reviewed: Did you receive and understand the discharge instructions provided?: No Medications obtained,verified, and reconciled?: Yes (Medications Reviewed) Any new allergies since your discharge?: No Dietary orders reviewed?: Yes Type of Diet Ordered:: low salt/heart healthy/carb modified Do you have support at home?: Yes People in Home: alone Name of Support/Comfort Primary Source: sons live nearby and assist her as needed  Medications Reviewed Today: Medications Reviewed Today     Reviewed by Charlyn Minerva, RN (Registered Nurse) on 11/20/22 at 1635  Med List Status: <None>   Medication Order Taking? Sig Documenting Provider Last Dose Status Informant  amLODipine (NORVASC) 5 MG tablet [id number] Yes Take 1 tablet (5 mg total) by mouth daily. Eustaquio Boyden, MD Taking Active   atorvastatin (LIPITOR) 40 MG tablet [id number] Yes Take 1 tablet (40 mg total) by mouth daily. Eustaquio Boyden, MD Taking Active   Blood Glucose Monitoring Suppl [facility] VERIO) w/Device Andria Rhein [id number] Yes Use to check blood sugar up to 4 times a day as directed Eustaquio Boyden, MD Taking Active    Cholecalciferol (VITAMIN D) 2000 units CAPS [id number] Yes Take 2,000 Units by mouth daily. [provider] Taking Active Self           Med Note Lenoria Farrier   Wed Feb 20, 2018 11:50 AM)    clopidogrel (PLAVIX) 75 MG tablet [id number] Yes Take 1 tablet (75 mg total) by mouth every morning. Eustaquio Boyden, MD Taking Active   Colchicine [facility]) 0.6 MG CAPS [id number] Yes Take 1 tablet by mouth daily as needed (for gout). Eustaquio Boyden, MD Taking Active   COMFORT EZ PEN NEEDLES 31G X 5 MM MISC [id number] Yes USE ONCE daily Eustaquio Boyden, MD Taking Active   famotidine (PEPCID) 20 MG tablet [id number] Yes Take 1 tablet (20 mg total) by mouth at bedtime. Eustaquio Boyden, MD Taking Active   glucose blood [facility] VERIO) test strip [id number] Yes USE TO check blood glucose AS DIRECTED four times daily Eustaquio Boyden, MD Taking Active   hydrALAZINE (APRESOLINE) 50 MG tablet [id number] Yes Take 1 tablet (50 mg total) by mouth in the morning and at bedtime. Eustaquio Boyden, MD Taking Active   hydrochlorothiazide (HYDRODIURIL) 12.5 MG tablet [id number] Yes Take 1 tablet (12.5 mg total) by mouth every morning. Eustaquio Boyden, MD Taking Active   Insulin Glargine [facility]) 100 UNIT/ML [id number] Yes Inject 20 Units into the skin at bedtime. Eustaquio Boyden, MD Taking Active   Lancets [facility] [place]) MISC [id number] Yes Use as instructed to check blood sugar once a day Eustaquio Boyden, MD Taking Active   losartan (COZAAR) 100 MG tablet [id number] Yes Take 1 tablet (100 mg total) by mouth every morning. Eustaquio Boyden, MD Taking Active   metoprolol succinate (TOPROL-XL) 25  MG 24 hr tablet [id number] Yes Take 0.5 tablets (12.5 mg total) by mouth daily. Eustaquio Boyden, MD Taking Active   nitroGLYCERIN (NITROSTAT) 0.4 MG SL tablet [id number] Yes Place 0.4 mg under the tongue every 5 (five) minutes as needed for chest pain. [provider] Taking  Active Self           Med Note Margo Aye, MISTY D   Tue May 31, 2021  4:57 AM)    nystatin cream (MYCOSTATIN) [id number] Yes Apply 1 application topically 2 (two) times daily.  Patient taking differently: Apply 1 application  topically daily as needed for dry skin (rash).   Eustaquio Boyden, MD Taking Active   potassium chloride (KLOR-CON) 10 MEQ tablet [id number] Yes Take 1 tablet (10 mEq total) by mouth every Monday, Wednesday, and Friday. Eustaquio Boyden, MD Taking Active   Med List Note Marsa Aris 03/16/20 2058): [phone]  [phone]            Home Care and Equipment/Supplies: Were Home Health Services Ordered?: NA Any new equipment or medical supplies ordered?: NA  Functional Questionnaire: Do you need assistance with bathing/showering or dressing?: No Do you need assistance with meal preparation?: No Do you need assistance with eating?: No Do you have difficulty maintaining continence: No Do you need assistance with getting out of bed/getting out of a chair/moving?: No Do you have difficulty managing or taking your medications?: No  Follow up appointments reviewed: PCP Follow-up appointment confirmed?: Yes Date of PCP follow-up appointment?: 12/19/22 (pt already had appt scheduled with PCP prior to ED visit and wanted to keep appt-did not want to be seen sooner since sxs resolved) Follow-up Provider: Dr. Sharen Hones Specialist [facility] Follow-up appointment confirmed?: NA Do you need transportation to your follow-up appointment?: No (pt confirms she is able to drive herself to MD appts) Do you understand care options if your condition(s) worsen?: Yes-patient verbalized understanding  SDOH Interventions Today    Flowsheet Row Most Recent Value  SDOH Interventions   Food Insecurity Interventions Intervention Not Indicated  Transportation Interventions Intervention Not Indicated       TOC Interventions Today    Flowsheet Row Most Recent Value   TOC Interventions   TOC Interventions Discussed/Reviewed TOC Interventions Discussed      Interventions Today    Flowsheet Row Most Recent Value  Chronic Disease   Chronic disease during today's visit Diabetes  General Interventions   General Interventions Discussed/Reviewed General Interventions Discussed, Doctor Visits  Doctor Visits Discussed/Reviewed Doctor Visits Discussed, PCP, Specialist  PCP/Specialist Visits Compliance with follow-up visit  Education Interventions   Education Provided Provided Education  Provided Verbal Education On Nutrition, When to see the doctor, Medication, Other  [sx mgmt]  Nutrition Interventions   Nutrition Discussed/Reviewed Nutrition Discussed, Adding fruits and vegetables, Increasing proteins, Decreasing salt, Decreasing sugar intake, Decreasing fats  Pharmacy Interventions   Pharmacy Dicussed/Reviewed Pharmacy Topics Discussed, Medications and their functions  Safety Interventions   Safety Discussed/Reviewed Safety Discussed        Alessandra Grout [facility] Care Management Care Management Community Coordinator Direct Phone: [phone] Toll Free: [phone] Fax: [phone]

## 2022-11-20 NOTE — Transitions of Care (Post Inpatient/ED Visit) (Signed)
   11/20/2022  Name: Kristen [name] MRN: [id number] DOB: 16-Jan-1941  Today's TOC FU Call Status: Today's TOC FU Call Status:: Unsuccessul Call (1st Attempt) Unsuccessful Call (1st Attempt) Date: 11/20/22  Red on EMMI-ED Discharge Alert Date & Reason:11/16/22 "Scheduled follow-up appt? No"  Attempted to reach the patient regarding the most recent Inpatient/ED visit.  Follow Up Plan: Additional outreach attempts will be made to reach the patient to complete the Transitions of Care (Post Inpatient/ED visit) call.     Antionette Fairy, RN,BSN,CCM [facility] Care Management Care Management Community Coordinator Direct Phone: [phone] Toll Free: [phone] Fax: [phone]

## 2022-12-03 ENCOUNTER — Other Ambulatory Visit: Payer: Self-pay | Admitting: Family Medicine

## 2022-12-03 DIAGNOSIS — E118 Type 2 diabetes mellitus with unspecified complications: Secondary | ICD-10-CM

## 2022-12-05 ENCOUNTER — Other Ambulatory Visit: Payer: Self-pay | Admitting: Family Medicine

## 2022-12-05 DIAGNOSIS — E118 Type 2 diabetes mellitus with unspecified complications: Secondary | ICD-10-CM

## 2022-12-06 NOTE — Telephone Encounter (Signed)
Rx already sent on 12/04/22, #100/3 to [facility].  Request denied.

## 2022-12-14 ENCOUNTER — Other Ambulatory Visit: Payer: Self-pay | Admitting: Family Medicine

## 2022-12-14 DIAGNOSIS — E1165 Type 2 diabetes mellitus with hyperglycemia: Secondary | ICD-10-CM

## 2022-12-19 ENCOUNTER — Other Ambulatory Visit: Payer: Self-pay | Admitting: Primary Care

## 2022-12-19 ENCOUNTER — Other Ambulatory Visit: Payer: Self-pay | Admitting: Family Medicine

## 2022-12-19 ENCOUNTER — Ambulatory Visit (INDEPENDENT_AMBULATORY_CARE_PROVIDER_SITE_OTHER): Payer: Medicare Other | Admitting: Family Medicine

## 2022-12-19 ENCOUNTER — Encounter: Payer: Self-pay | Admitting: Family Medicine

## 2022-12-19 VITALS — BP 124/60 | HR 62 | Temp 97.8°F | Resp 14 | Ht 61.0 in | Wt 184.4 lb

## 2022-12-19 DIAGNOSIS — E118 Type 2 diabetes mellitus with unspecified complications: Secondary | ICD-10-CM

## 2022-12-19 DIAGNOSIS — D649 Anemia, unspecified: Secondary | ICD-10-CM | POA: Diagnosis not present

## 2022-12-19 DIAGNOSIS — R209 Unspecified disturbances of skin sensation: Secondary | ICD-10-CM

## 2022-12-19 DIAGNOSIS — E538 Deficiency of other specified B group vitamins: Secondary | ICD-10-CM | POA: Diagnosis not present

## 2022-12-19 DIAGNOSIS — E876 Hypokalemia: Secondary | ICD-10-CM | POA: Diagnosis not present

## 2022-12-19 DIAGNOSIS — I1A0 Resistant hypertension: Secondary | ICD-10-CM | POA: Diagnosis not present

## 2022-12-19 DIAGNOSIS — E1165 Type 2 diabetes mellitus with hyperglycemia: Secondary | ICD-10-CM | POA: Diagnosis not present

## 2022-12-19 DIAGNOSIS — Z794 Long term (current) use of insulin: Secondary | ICD-10-CM | POA: Diagnosis not present

## 2022-12-19 DIAGNOSIS — E1159 Type 2 diabetes mellitus with other circulatory complications: Secondary | ICD-10-CM

## 2022-12-19 DIAGNOSIS — K769 Liver disease, unspecified: Secondary | ICD-10-CM

## 2022-12-19 LAB — IBC PANEL
Iron: 73 ug/dL (ref 42–145)
Saturation Ratios: 22.5 % (ref 20.0–50.0)
TIBC: 324.8 ug/dL (ref 250.0–450.0)
Transferrin: 232 mg/dL (ref 212.0–360.0)

## 2022-12-19 LAB — CBC WITH DIFFERENTIAL/PLATELET
Basophils Absolute: 0 10*3/uL (ref 0.0–0.1)
Basophils Relative: 0.5 % (ref 0.0–3.0)
Eosinophils Absolute: 0 10*3/uL (ref 0.0–0.7)
Eosinophils Relative: 0.6 % (ref 0.0–5.0)
HCT: 35.8 % — ABNORMAL LOW (ref 36.0–46.0)
Hemoglobin: 11.3 g/dL — ABNORMAL LOW (ref 12.0–15.0)
Lymphocytes Relative: 34 % (ref 12.0–46.0)
Lymphs Abs: 2.3 10*3/uL (ref 0.7–4.0)
MCHC: 31.6 g/dL (ref 30.0–36.0)
MCV: 85.9 fl (ref 78.0–100.0)
Monocytes Absolute: 0.3 10*3/uL (ref 0.1–1.0)
Monocytes Relative: 4.2 % (ref 3.0–12.0)
Neutro Abs: 4 10*3/uL (ref 1.4–7.7)
Neutrophils Relative %: 60.7 % (ref 43.0–77.0)
Platelets: 257 10*3/uL (ref 150.0–400.0)
RBC: 4.17 Mil/uL (ref 3.87–5.11)
RDW: 15.6 % — ABNORMAL HIGH (ref 11.5–15.5)
WBC: 6.6 10*3/uL (ref 4.0–10.5)

## 2022-12-19 LAB — POCT GLYCOSYLATED HEMOGLOBIN (HGB A1C): Hemoglobin A1C: 8.1 % — AB (ref 4.0–5.6)

## 2022-12-19 LAB — BASIC METABOLIC PANEL
BUN: 19 mg/dL (ref 6–23)
CO2: 31 mEq/L (ref 19–32)
Calcium: 9.8 mg/dL (ref 8.4–10.5)
Chloride: 102 mEq/L (ref 96–112)
Creatinine, Ser: 1.08 mg/dL (ref 0.40–1.20)
GFR: 48.22 mL/min — ABNORMAL LOW (ref >60.00)
Glucose, Bld: 212 mg/dL — ABNORMAL HIGH (ref 70–99)
Potassium: 4 mEq/L (ref 3.5–5.1)
Sodium: 142 mEq/L (ref 135–145)

## 2022-12-19 LAB — FERRITIN: Ferritin: 63.7 ng/mL (ref 10.0–291.0)

## 2022-12-19 LAB — VITAMIN B12: Vitamin B-12: 488 pg/mL (ref 211–911)

## 2022-12-19 LAB — MAGNESIUM: Magnesium: 1.5 mg/dL (ref 1.5–2.5)

## 2022-12-19 MED ORDER — MICONAZOLE 2 % EX POWD: 1.0000 | Freq: Two times a day (BID) | CUTANEOUS | 1 refills | Status: AC | PRN

## 2022-12-19 NOTE — Assessment & Plan Note (Signed)
Chronic, A1c trending up to 8.1% - encouraged renewed efforts to follow diabetic diet. Continue basaglar 20u daily

## 2022-12-19 NOTE — Assessment & Plan Note (Signed)
Chronic, BP stable on current regimen - continue

## 2022-12-19 NOTE — Assessment & Plan Note (Signed)
Update B12 levels off replacement.

## 2022-12-19 NOTE — Assessment & Plan Note (Signed)
Update CBC with iron panel

## 2022-12-19 NOTE — Patient Instructions (Signed)
Labs today  Continue current medicines. Sugar was a bit worse but stable. Work on low sugar low carb diabetic diet.  Let us know name of new pharmacy to send all your medicines to.

## 2022-12-19 NOTE — Assessment & Plan Note (Addendum)
Nodular contour on MRI 06/2022, presumed fatty liver related.

## 2022-12-19 NOTE — Progress Notes (Signed)
Ph: [phone] Fax: [phone]   Patient ID: Kristen [name], female    DOB: [birth year], 82 y.o.   MRN: [id number]  This visit was conducted in person.  BP 124/60 (BP Location: Left Arm)   Pulse 62   Temp 97.8 F (36.6 C) (Temporal)   Resp 14   Ht 5\' 1"  (1.549 m)   Wt 184 lb 6.4 oz (83.6 kg)   SpO2 99%   BMI 34.84 kg/m    CC: 3 mo DM f/u visit, ER f/u  Subjective:   HPI: Kristen [name] is a 82 y.o. female presenting on 12/19/2022 for Medical Management of Chronic Issues (3 mo DM f/u)   Recent ER eval last month 11/14/2022 for L neck pain, L arm pain, L chest pain. Found to be hypertensive, treated with home BP regimen. Imaging showed chronic appearing bilateral cerebellar infarcts. Treated with flexeril with benefit.   Notes intermittent R hand cold feeling from forearm into digits. Ongoing for the past week. No paresthesias or numbness. No blood in stool or urine. No easy bruising or bleeding.  Lab Results  Component Value Date   VITAMINB12 354 06/07/2022   Lab Results  Component Value Date   WBC 7.0 11/14/2022   HGB 10.7 (L) 11/14/2022   HCT 34.5 (L) 11/14/2022   MCV 87.3 11/14/2022   PLT 234 11/14/2022   Lab Results  Component Value Date   IRON 37 06/29/2015   TIBC 330 06/29/2015   FERRITIN 42 06/29/2015   DM - does regularly check sugars twice weekly - "up and down" peak 180s. Compliant with antihyperglycemic regimen which includes: basaglar 20u daily. Denies low sugars or hypoglycemic symptoms. Denies paresthesias, blurry vision. Last diabetic eye exam 09/2022. Glucometer brand: [employer] psychotherap[employer]. Last foot exam: DUE. DSME: declined. Lab Results  Component Value Date   HGBA1C 8.1 (A) 12/19/2022   Diabetic Foot Exam - Simple   Simple Foot Form Diabetic Foot exam was performed with the following findings: Yes 12/19/2022 10:23 AM  Visual Inspection No deformities, no ulcerations, no other skin breakdown bilaterally: Yes Sensation Testing Intact to touch  and monofilament testing bilaterally: Yes Pulse Check Posterior Tibialis and Dorsalis pulse intact bilaterally: Yes Comments No claudication    Lab Results  Component Value Date   MICROALBUR 11.5 (H) 05/30/2021         Relevant past medical, surgical, family and social history reviewed and updated as indicated. Interim medical history since our last visit reviewed. Allergies and medications reviewed and updated. Outpatient Medications Prior to Visit  Medication Sig Dispense Refill   amLODipine (NORVASC) 5 MG tablet Take 1 tablet (5 mg total) by mouth daily. 90 tablet 4   atorvastatin (LIPITOR) 40 MG tablet Take 1 tablet (40 mg total) by mouth daily. 90 tablet 4   Blood Glucose Monitoring Suppl (ONETOUCH VERIO) w/Device KIT Use to check blood sugar up to 4 times a day as directed 1 kit 0   Cholecalciferol (VITAMIN D) 2000 units CAPS Take 2,000 Units by mouth daily.     clopidogrel (PLAVIX) 75 MG tablet Take 1 tablet (75 mg total) by mouth every morning. 90 tablet 4   Colchicine (MITIGARE) 0.6 MG CAPS Take 1 tablet by mouth daily as needed (for gout). 30 capsule 3   colchicine 0.6 MG tablet TAKE 1 TABLET BY MOUTH DAILY AS NEEDED FOR GOUT 30 tablet 10   famotidine (PEPCID) 20 MG tablet Take 1 tablet (20 mg total) by mouth at bedtime.  glucose blood (ONETOUCH VERIO) test strip USE TO TEST BLOOD SUGAR 4 TIMES DAILY AS DIRECTED 100 each 10   hydrALAZINE (APRESOLINE) 50 MG tablet Take 1 tablet (50 mg total) by mouth in the morning and at bedtime. 180 tablet 4   hydrochlorothiazide (HYDRODIURIL) 12.5 MG tablet Take 1 tablet (12.5 mg total) by mouth every morning. 90 tablet 4   Insulin Glargine (BASAGLAR KWIKPEN) 100 UNIT/ML Inject 20 Units into the skin at bedtime. 15 mL 4   Insulin Pen Needle (COMFORT EZ PEN NEEDLES) 31G X 5 MM MISC Use as directed to inject insulin daily 100 each 3   Lancets (ONETOUCH DELICA PLUS LANCET33G) MISC Use as instructed to check blood sugar once a day 100 each 4    losartan (COZAAR) 100 MG tablet Take 1 tablet (100 mg total) by mouth every morning. 90 tablet 4   metoprolol succinate (TOPROL-XL) 25 MG 24 hr tablet Take 0.5 tablets (12.5 mg total) by mouth daily. 45 tablet 4   nitroGLYCERIN (NITROSTAT) 0.4 MG SL tablet Place 0.4 mg under the tongue every 5 (five) minutes as needed for chest pain.     potassium chloride (KLOR-CON) 10 MEQ tablet Take 1 tablet (10 mEq total) by mouth every Monday, Wednesday, and Friday. 40 tablet 4   nystatin cream (MYCOSTATIN) Apply 1 application topically 2 (two) times daily. (Patient not taking: Reported on 12/19/2022) 30 g 0   No facility-administered medications prior to visit.     Per HPI unless specifically indicated in ROS section below Review of Systems  Objective:  BP 124/60 (BP Location: Left Arm)   Pulse 62   Temp 97.8 F (36.6 C) (Temporal)   Resp 14   Ht 5\' 1"  (1.549 m)   Wt 184 lb 6.4 oz (83.6 kg)   SpO2 99%   BMI 34.84 kg/m   Wt Readings from Last 3 Encounters:  12/19/22 184 lb 6.4 oz (83.6 kg)  11/14/22 180 lb (81.6 kg)  09/13/22 184 lb 6 oz (83.6 kg)      Physical Exam Vitals and nursing note reviewed.  Constitutional:      Appearance: Normal appearance. She is not ill-appearing.  HENT:     Head: Normocephalic and atraumatic.     Mouth/Throat:     Mouth: Mucous membranes are moist.     Pharynx: Oropharynx is clear. No oropharyngeal exudate or posterior oropharyngeal erythema.  Eyes:     Extraocular Movements: Extraocular movements intact.     Conjunctiva/sclera: Conjunctivae normal.     Pupils: Pupils are equal, round, and reactive to light.  Cardiovascular:     Rate and Rhythm: Normal rate and regular rhythm.     Pulses: Normal pulses.     Heart sounds: Normal heart sounds. No murmur heard. Pulmonary:     Effort: Pulmonary effort is normal. No respiratory distress.     Breath sounds: Normal breath sounds. No wheezing, rhonchi or rales.  Musculoskeletal:     Right lower leg: No  edema.     Left lower leg: No edema.     Comments:  See HPI for foot exam if done  Skin:    General: Skin is warm and dry.     Findings: No rash.  Neurological:     Mental Status: She is alert.  Psychiatric:        Mood and Affect: Mood normal.        Behavior: Behavior normal.       Results for orders placed or performed  in visit on 12/19/22  POCT A1C  Result Value Ref Range   Hemoglobin A1C 8.1 (A) 4.0 - 5.6 %   HbA1c POC (<> result, manual entry)     HbA1c, POC (prediabetic range)     HbA1c, POC (controlled diabetic range)      Assessment & Plan:   Problem List Items Addressed This Visit     Resistant hypertension    Chronic, BP stable on current regimen - continue.       Type 2 diabetes mellitus with hyperglycemia (HCC) - Primary    Chronic, A1c trending up to 8.1% - encouraged renewed efforts to follow diabetic diet. Continue basaglar 20u daily      Relevant Orders   POCT A1C (Completed)   Basic metabolic panel   Vitamin B12   Anemia    Update CBC with iron panel      Relevant Orders   CBC with Differential/Platelet   Ferritin   IBC panel   Disturbance of skin sensation    Cold feeling to right arm/hand, normal exam with strong radial pulses - update labs including B12 and iron panel.       Type 2 diabetes mellitus with vascular disease (HCC)   Relevant Orders   POCT A1C (Completed)   Low serum vitamin B12    Update B12 levels off replacement.      Liver disease    Nodular contour on MRI 06/2022, presumed fatty liver related.       Other Visit Diagnoses     Hypokalemia       Relevant Orders   Basic metabolic panel   Magnesium        Meds ordered this encounter  Medications   Miconazole 2 % POWD    Sig: Apply 1 Application topically 2 (two) times daily as needed (fungal skin infection).    Dispense:  43 g    Refill:  1    Orders Placed This Encounter  Procedures   Basic metabolic panel   Magnesium   CBC with  Differential/Platelet   Ferritin   IBC panel   Vitamin B12   POCT A1C    Patient Instructions  Labs today  Continue current medicines. Sugar was a bit worse but stable. Work on low sugar low carb diabetic diet.  Let us know name of new pharmacy to send all your medicines to.   Follow up plan: Return in about 3 months (around 03/21/2023), or if symptoms worsen or fail to improve, for follow up visit.  Eustaquio Boyden, MD

## 2022-12-19 NOTE — Assessment & Plan Note (Addendum)
Cold feeling to right arm/hand, normal exam with strong radial pulses - update labs including B12 and iron panel.

## 2022-12-20 ENCOUNTER — Other Ambulatory Visit: Payer: Self-pay

## 2022-12-20 ENCOUNTER — Telehealth: Payer: Self-pay | Admitting: Family Medicine

## 2022-12-20 ENCOUNTER — Other Ambulatory Visit (HOSPITAL_COMMUNITY): Payer: Self-pay

## 2022-12-20 ENCOUNTER — Other Ambulatory Visit: Payer: Self-pay | Admitting: Family Medicine

## 2022-12-20 DIAGNOSIS — L304 Erythema intertrigo: Secondary | ICD-10-CM

## 2022-12-20 DIAGNOSIS — M1A09X0 Idiopathic chronic gout, multiple sites, without tophus (tophi): Secondary | ICD-10-CM

## 2022-12-20 DIAGNOSIS — E785 Hyperlipidemia, unspecified: Secondary | ICD-10-CM

## 2022-12-20 DIAGNOSIS — E118 Type 2 diabetes mellitus with unspecified complications: Secondary | ICD-10-CM

## 2022-12-20 DIAGNOSIS — E1165 Type 2 diabetes mellitus with hyperglycemia: Secondary | ICD-10-CM

## 2022-12-20 DIAGNOSIS — I700 Atherosclerosis of aorta: Secondary | ICD-10-CM

## 2022-12-20 DIAGNOSIS — E1169 Type 2 diabetes mellitus with other specified complication: Secondary | ICD-10-CM

## 2022-12-20 DIAGNOSIS — I1A0 Resistant hypertension: Secondary | ICD-10-CM

## 2022-12-20 NOTE — Telephone Encounter (Signed)
Patient called in wanting to switch preferred pharmacies, sates from now on she will be using [facility] [address] and for their delivery services. Patient is also asking if she can have her medications in 90 day supply rather than 30

## 2022-12-20 NOTE — Telephone Encounter (Signed)
ERx 

## 2022-12-20 NOTE — Telephone Encounter (Signed)
Preferred pharmacy has been updated in chart.

## 2022-12-21 ENCOUNTER — Other Ambulatory Visit (HOSPITAL_COMMUNITY): Payer: Self-pay

## 2022-12-21 MED ORDER — AMLODIPINE BESYLATE 5 MG PO TABS
5.0000 mg | ORAL_TABLET | Freq: Every day | ORAL | 1 refills | Status: DC
Start: 2022-12-21 — End: 2023-02-15
  Filled 2022-12-21 – 2023-01-03 (×2): qty 90, 90d supply, fill #0

## 2022-12-21 MED ORDER — BASAGLAR KWIKPEN 100 UNIT/ML ~~LOC~~ SOPN
20.0000 [IU] | PEN_INJECTOR | Freq: Every day | SUBCUTANEOUS | 4 refills | Status: DC
  Filled 2022-12-21: qty 15, 75d supply, fill #0

## 2022-12-21 MED ORDER — COLCHICINE 0.6 MG PO CAPS
0.6000 mg | ORAL_CAPSULE | Freq: Every day | ORAL | 2 refills | Status: DC | PRN
Start: 2022-12-21 — End: 2023-02-15
  Filled 2022-12-21: qty 30, 30d supply, fill #0

## 2022-12-21 MED ORDER — POTASSIUM CHLORIDE ER 10 MEQ PO TBCR
10.0000 meq | EXTENDED_RELEASE_TABLET | ORAL | 1 refills | Status: DC
  Filled 2022-12-21: qty 40, 90d supply, fill #0
  Filled 2023-03-05: qty 12, 28d supply, fill #0
  Filled 2023-03-05: qty 40, 93d supply, fill #0
  Filled 2023-04-03: qty 12, 28d supply, fill #1
  Filled 2023-04-17 – 2023-04-26 (×2): qty 12, 28d supply, fill #2
  Filled 2023-05-21: qty 12, 28d supply, fill #3

## 2022-12-21 MED ORDER — HYDRALAZINE HCL 50 MG PO TABS
50.0000 mg | ORAL_TABLET | Freq: Two times a day (BID) | ORAL | 1 refills | Status: DC
Start: 2022-12-21 — End: 2023-02-15
  Filled 2022-12-21: qty 180, 90d supply, fill #0

## 2022-12-21 MED ORDER — ONETOUCH DELICA PLUS LANCET33G MISC
3 refills | Status: DC
Start: 2022-12-21 — End: 2023-11-19
  Filled 2022-12-21: qty 100, 100d supply, fill #0

## 2022-12-21 MED ORDER — ONETOUCH VERIO VI STRP
ORAL_STRIP | 3 refills | Status: DC
Start: 2022-12-21 — End: 2023-02-15
  Filled 2022-12-21: qty 100, 25d supply, fill #0

## 2022-12-21 MED ORDER — ATORVASTATIN CALCIUM 40 MG PO TABS
40.0000 mg | ORAL_TABLET | Freq: Every day | ORAL | 1 refills | Status: DC
  Filled 2022-12-21: qty 90, 90d supply, fill #0
  Filled 2023-03-05: qty 30, 30d supply, fill #0
  Filled 2023-04-02 – 2023-04-03 (×3): qty 30, 30d supply, fill #1
  Filled 2023-04-17 – 2023-04-26 (×2): qty 30, 30d supply, fill #2
  Filled 2023-05-21: qty 30, 30d supply, fill #3

## 2022-12-21 MED ORDER — COMFORT EZ PEN NEEDLES 31G X 5 MM MISC
3 refills | Status: DC
Start: 2022-12-21 — End: 2023-11-14
  Filled 2022-12-21: qty 100, 90d supply, fill #0
  Filled 2023-01-30: qty 100, 30d supply, fill #0
  Filled 2023-03-05 – 2023-05-01 (×3): qty 100, 30d supply, fill #1
  Filled 2023-10-08 (×2): qty 100, 30d supply, fill #2
  Filled 2023-10-22 – 2023-11-01 (×2): qty 100, 30d supply, fill #3

## 2022-12-21 MED ORDER — LOSARTAN POTASSIUM 100 MG PO TABS
100.0000 mg | ORAL_TABLET | Freq: Every morning | ORAL | 1 refills | Status: DC
Start: 2022-12-21 — End: 2023-06-05
  Filled 2022-12-21: qty 90, 90d supply, fill #0
  Filled 2023-03-05: qty 30, 30d supply, fill #0
  Filled 2023-04-02 – 2023-04-03 (×3): qty 30, 30d supply, fill #1
  Filled 2023-04-17 – 2023-04-26 (×2): qty 30, 30d supply, fill #2
  Filled 2023-05-21: qty 30, 30d supply, fill #3

## 2022-12-21 MED ORDER — HYDROCHLOROTHIAZIDE 12.5 MG PO TABS
12.5000 mg | ORAL_TABLET | Freq: Every morning | ORAL | 1 refills | Status: DC
Start: 2022-12-21 — End: 2023-02-15
  Filled 2022-12-21: qty 90, 90d supply, fill #0

## 2022-12-21 MED ORDER — CLOPIDOGREL BISULFATE 75 MG PO TABS
75.0000 mg | ORAL_TABLET | Freq: Every morning | ORAL | 1 refills | Status: DC
Start: 2022-12-21 — End: 2023-06-05
  Filled 2022-12-21: qty 90, 90d supply, fill #0
  Filled 2023-03-05: qty 30, 30d supply, fill #0
  Filled 2023-04-02 – 2023-04-03 (×3): qty 30, 30d supply, fill #1
  Filled 2023-04-17 – 2023-04-26 (×2): qty 30, 30d supply, fill #2
  Filled 2023-05-21: qty 30, 30d supply, fill #3

## 2022-12-21 MED ORDER — NYSTATIN 100000 UNIT/GM EX CREA
1.0000 | TOPICAL_CREAM | Freq: Two times a day (BID) | CUTANEOUS | 0 refills | Status: DC
  Filled 2022-12-21: qty 30, 15d supply, fill #0

## 2022-12-21 MED ORDER — METOPROLOL SUCCINATE ER 25 MG PO TB24
12.5000 mg | ORAL_TABLET | Freq: Every day | ORAL | 1 refills | Status: DC
Start: 2022-12-21 — End: 2023-06-05
  Filled 2022-12-21 – 2023-03-05 (×2): qty 45, 90d supply, fill #0
  Filled 2023-03-05: qty 15, 30d supply, fill #0
  Filled 2023-04-03: qty 15, 30d supply, fill #1
  Filled 2023-04-17 – 2023-04-27 (×3): qty 15, 30d supply, fill #2
  Filled 2023-05-21 (×2): qty 15, 30d supply, fill #3

## 2022-12-22 ENCOUNTER — Other Ambulatory Visit (HOSPITAL_COMMUNITY): Payer: Self-pay

## 2022-12-22 ENCOUNTER — Other Ambulatory Visit: Payer: Self-pay

## 2022-12-23 ENCOUNTER — Other Ambulatory Visit (HOSPITAL_COMMUNITY): Payer: Self-pay

## 2022-12-26 ENCOUNTER — Other Ambulatory Visit: Payer: Self-pay

## 2022-12-26 ENCOUNTER — Telehealth: Payer: Self-pay | Admitting: Family Medicine

## 2022-12-26 MED ORDER — CYCLOBENZAPRINE HCL 5 MG PO TABS: 5.0000 mg | ORAL_TABLET | Freq: Two times a day (BID) | ORAL | 0 refills | Status: DC | PRN

## 2022-12-26 NOTE — Addendum Note (Signed)
Addended by: Eustaquio Boyden on: 12/26/2022 04:52 PM   Modules accepted: Orders

## 2022-12-26 NOTE — Telephone Encounter (Addendum)
Plz notify I sent in cyclobenzaprine 5mg  Rx to pharmacy. Recommend OV if ongoing pain or not improved with treatment.

## 2022-12-26 NOTE — Telephone Encounter (Signed)
According to 11/14/22 [facility] ED notes, pt was prescribed cyclobenzaprine 5 mg tab. However, rx for tizanidine was sent to [facility] mail order on 12/20/22, #20.   Spoke with pt relaying info above. Pt states she has not received tizanidine shipment and does not use [facility]. Pt prefers cyclobenzaprine, stating it really worked. Pt asks rx be sent to [facility].

## 2022-12-26 NOTE — Telephone Encounter (Signed)
Patient called in and stated that she was in the hospital and was prescribed some muscle relaxer for pain and the pain started again yesterday. She was wanting to know if she could get a refill of the medication because she doesn't want to end up in the hospital again. She couldn't find the name of the medicine and she throw the bottle away. Thank you!

## 2022-12-27 NOTE — Telephone Encounter (Signed)
Spoke with pt relaying Dr. G's message.  Pt verbalizes understanding and expresses her thanks.  

## 2022-12-28 ENCOUNTER — Other Ambulatory Visit: Payer: Self-pay

## 2022-12-28 ENCOUNTER — Other Ambulatory Visit (HOSPITAL_COMMUNITY): Payer: Self-pay

## 2022-12-28 MED ORDER — CLOPIDOGREL BISULFATE 75 MG PO TABS
75.0000 mg | ORAL_TABLET | Freq: Every morning | ORAL | 2 refills | Status: DC
  Filled 2023-01-02: qty 30, 30d supply, fill #0

## 2022-12-28 MED ORDER — ONETOUCH DELICA LANCETS 33G MISC: 1 refills | Status: AC

## 2022-12-28 MED ORDER — HYDRALAZINE HCL 50 MG PO TABS
50.0000 mg | ORAL_TABLET | Freq: Two times a day (BID) | ORAL | 10 refills | Status: DC
  Filled 2023-01-02: qty 60, 30d supply, fill #0
  Filled 2023-03-05: qty 60, 30d supply, fill #1
  Filled 2023-04-02 – 2023-04-03 (×3): qty 60, 30d supply, fill #2
  Filled 2023-04-17 – 2023-04-26 (×2): qty 60, 30d supply, fill #3
  Filled 2023-05-21: qty 60, 30d supply, fill #4
  Filled 2023-06-06 – 2023-06-13 (×2): qty 60, 30d supply, fill #5

## 2022-12-28 MED ORDER — ATORVASTATIN CALCIUM 40 MG PO TABS
40.0000 mg | ORAL_TABLET | Freq: Every day | ORAL | 2 refills | Status: DC
  Filled 2023-01-02: qty 30, 30d supply, fill #0

## 2022-12-28 MED ORDER — TIZANIDINE HCL 4 MG PO TABS: 4.0000 mg | ORAL_TABLET | Freq: Three times a day (TID) | ORAL | 0 refills | Status: DC | PRN

## 2022-12-28 MED ORDER — AMLODIPINE BESYLATE 5 MG PO TABS
5.0000 mg | ORAL_TABLET | Freq: Every day | ORAL | 1 refills | Status: DC
  Filled 2023-01-02: qty 30, 30d supply, fill #0
  Filled 2023-03-05: qty 30, 30d supply, fill #1
  Filled 2023-04-02 – 2023-04-03 (×3): qty 30, 30d supply, fill #2
  Filled 2023-04-17 – 2023-04-26 (×2): qty 30, 30d supply, fill #3
  Filled 2023-05-21: qty 30, 30d supply, fill #4

## 2022-12-28 MED ORDER — POTASSIUM CHLORIDE ER 10 MEQ PO TBCR
10.0000 meq | EXTENDED_RELEASE_TABLET | ORAL | 12 refills | Status: DC
  Filled 2023-01-02 (×2): qty 12, 28d supply, fill #0

## 2022-12-28 MED ORDER — METOPROLOL SUCCINATE ER 25 MG PO TB24
12.5000 mg | ORAL_TABLET | Freq: Every day | ORAL | 2 refills | Status: DC
  Filled 2023-01-02: qty 15, 30d supply, fill #0

## 2022-12-28 MED ORDER — CYCLOBENZAPRINE HCL 5 MG PO TABS: 5.0000 mg | ORAL_TABLET | Freq: Three times a day (TID) | ORAL | 0 refills | Status: DC | PRN

## 2022-12-28 MED ORDER — HYDROCHLOROTHIAZIDE 12.5 MG PO TABS
12.5000 mg | ORAL_TABLET | Freq: Every morning | ORAL | 2 refills | Status: DC
  Filled 2023-01-02: qty 30, 30d supply, fill #0
  Filled 2023-03-05: qty 30, 30d supply, fill #1
  Filled 2023-04-02 – 2023-04-03 (×3): qty 30, 30d supply, fill #2
  Filled 2023-04-17 – 2023-04-26 (×2): qty 30, 30d supply, fill #3
  Filled 2023-05-21: qty 30, 30d supply, fill #4

## 2022-12-28 MED ORDER — BASAGLAR KWIKPEN 100 UNIT/ML ~~LOC~~ SOPN
20.0000 [IU] | PEN_INJECTOR | Freq: Every day | SUBCUTANEOUS | 4 refills | Status: DC
  Filled 2023-01-02: qty 15, 75d supply, fill #0

## 2022-12-28 MED ORDER — LOSARTAN POTASSIUM 100 MG PO TABS
100.0000 mg | ORAL_TABLET | Freq: Every morning | ORAL | 10 refills | Status: DC
  Filled 2023-01-02: qty 30, 30d supply, fill #0

## 2023-01-01 ENCOUNTER — Other Ambulatory Visit (HOSPITAL_COMMUNITY): Payer: Self-pay

## 2023-01-02 ENCOUNTER — Other Ambulatory Visit (HOSPITAL_COMMUNITY): Payer: Self-pay

## 2023-01-02 ENCOUNTER — Other Ambulatory Visit: Payer: Self-pay

## 2023-01-02 MED FILL — Glucose Blood Test Strip: 25 days supply | Qty: 100 | Fill #0 | Status: AC

## 2023-01-03 ENCOUNTER — Other Ambulatory Visit: Payer: Self-pay

## 2023-01-30 ENCOUNTER — Other Ambulatory Visit (HOSPITAL_COMMUNITY): Payer: Self-pay

## 2023-02-09 ENCOUNTER — Telehealth: Payer: Self-pay | Admitting: Family Medicine

## 2023-02-09 DIAGNOSIS — Z794 Long term (current) use of insulin: Secondary | ICD-10-CM

## 2023-02-09 DIAGNOSIS — I1A0 Resistant hypertension: Secondary | ICD-10-CM

## 2023-02-09 DIAGNOSIS — N1831 Chronic kidney disease, stage 3a: Secondary | ICD-10-CM

## 2023-02-09 DIAGNOSIS — Z8673 Personal history of transient ischemic attack (TIA), and cerebral infarction without residual deficits: Secondary | ICD-10-CM

## 2023-02-09 DIAGNOSIS — E1159 Type 2 diabetes mellitus with other circulatory complications: Secondary | ICD-10-CM

## 2023-02-09 DIAGNOSIS — I2510 Atherosclerotic heart disease of native coronary artery without angina pectoris: Secondary | ICD-10-CM

## 2023-02-09 DIAGNOSIS — E113392 Type 2 diabetes mellitus with moderate nonproliferative diabetic retinopathy without macular edema, left eye: Secondary | ICD-10-CM

## 2023-02-09 NOTE — Telephone Encounter (Signed)
Pt called in and stated that she needs a new nurse to come out and help her with her meds pt stated that her new phone is disconnected and needs help

## 2023-02-12 ENCOUNTER — Telehealth: Payer: Self-pay | Admitting: *Deleted

## 2023-02-12 NOTE — Progress Notes (Signed)
Care Guide Note  02/12/2023 Name: [name] MRN: [id number] DOB: [birth year]  Referred by: Eustaquio Boyden, MD Reason for referral : Care Coordination (Outreach to schedule referral with pharm d )   Kristen [name] is a 82 y.o. year old female who is a primary care patient of Eustaquio Boyden, MD. [name] was referred to the pharmacist for assistance related to HTN, HLD, and DM.    Successful contact was made with the patient to discuss pharmacy services including being ready for the pharmacist to call at least 5 minutes before the scheduled appointment time, to have medication bottles and any blood sugar or blood pressure readings ready for review. The patient agreed to meet with the pharmacist via with the pharmacist via telephone visit on (02/15/2023 at 11am).    Burman Nieves, CCMA Care Coordination Care Guide Direct Dial: [phone]

## 2023-02-12 NOTE — Telephone Encounter (Signed)
Spoke with pt to get details about nurse request to help with meds. Pt explained she gets confused now with what pills are for what. Says they are packaged differently than when she got them from [facility]. Says she just wants some help to get them organized. Pt states she got a call today and has an appt 02/15/23 at 11:00 with the pharmacist to hopefully help her.

## 2023-02-12 NOTE — Telephone Encounter (Signed)
Can we get details on what specifically she needs and what is best # to reach her?  Does she need pharmacy assistance for med management or care coordinator/management?  I went ahead and placed care management referral.

## 2023-02-15 ENCOUNTER — Other Ambulatory Visit: Payer: Medicare Other

## 2023-02-15 NOTE — Progress Notes (Signed)
02/15/2023 Name: Kristen [name] MRN: [id number] DOB: 03/20/1941  Subjective  No chief complaint on file.   Reason for visit: Kristen [name] is a 82 y.o. year old female who presented for a telephone visit.   They were referred to the pharmacist by their PCP for assistance in managing complex medication management.   Care Team: Primary Care Provider: Eustaquio Boyden, MD  Reason for visit: ?  Kristen [name] is a 82 y.o. female who requests assistance with her medications (gets confused now with what pills are for what. Says they are packaged differently than when she got them from [facility]. Says she just wants some help to get them organized).  Prescription drug coverage: Payor: [employer] MEDICARE / Plan: [facility] MEDICARE / Product Type: *No Product type* / .  Current Pharmacy: [facility] (adherence packaging)      History of Present Illness:   Medication Access/Adherence: Reports that all medications are affordable.  Current Patient Assistance: None Medication Adherence: Patient denies missing doses of their medication typically Has not taken medication in the past 3 days due to confusion with her new pill packaging.  Previously received pill packs from [facility], now receiving packets through [facility].   Patient reports confusion given her new adherence packaging is much different from previous packaging.  She was also concerned because she received 2 pill bottles and is confused why those medications are not in the adherence packaging  A/P Patient has her medications with her at the time of the visit. We reviewed all medications present in patient's home.  We reviewed her compliance packaging with teach back. Patient verbalizes understanding of the packets (each day, she has 3 individual packets to take). Confirmed she is able to read text on packets. Patient confirms feeling more comfortable now that her medications have been reviewed. She is agreeable to resuming her  medications today.   Patient confirmed contents of each pill packet:  Pill Packaging:  Takes in the morning: Packet with 5 tablets (amlodipine, atorvastatin, clopidogrel, hydralazine, hydrochlorothiazide) Takes in the morning: 1 tablet packet (losartan) Takes in the evening/bedtime: 1 tablet packet (hydralazine 2nd BID dose)  Medications in Pill Bottles: metoprolol succinate (not packed due to 1/2 tablet instruction), potassium chloride (not packaged due to MWF dosing).  Medication table by disease state has been printed and mailed to patient (documented below)    Medications as of October 24th 2024  ? Medication name Instructions  Notes   Diabetes   Basaglar insulin 20 units under the skin each night    Blood Pressure  Metoprolol 25 mg tablet Half of a tablet once daily  This pill is in a bottle  ?(Tablet must be cut in half)  Amlodipine 5 mg tablet 1 tablet each morning  In your morning 5 pill packet  Hydralazine 50 mg tablet 1 tablet two times daily  In your morning 5 pill packet and in your 1 pill bedtime packet  Hydrochlorothiazide 12.5 1 tablet daily  In your morning 5 pill packet  Losartan 100 mg tablet 1 tablet daily  In your morning 1 pill packet  Cholesterol and heart health  Atorvastatin 40 mg tablet 1 tablet daily  In your morning 5 pill packet  Clopidogrel 75 mg tablet 1 tablet daily  In your morning 5 pill packet  Other Medications   Potassium Chloride 10 mEq tablet 1 tablet Monday, Wednesday, Friday  This pill is in a bottle   Medications you take only as needed: (Not in  your medicine packets) Colchicine (for gout flares) Cyclobenzaprine (muscle relaxer), tizanidine (Zanaflex) (muscle relaxer) Miconazole powder (breast tissue irritation) Nitroglycerine (chest pain or pressure)  The medicines you purchase from the pharmacy: (Not in your medicine packets) Vitamin D3 (1 tablet daily) Famotidine (Pepcid) at night (for the stomach)   Follow Up  Future Appointments   Date Time Provider Department Center  02/15/2023 11:00 AM [facility] CCM PHARMACIST [facility]  03/26/2023 11:00 AM Eustaquio Boyden, MD [facility]  04/16/2023 11:15 AM [facility] ANNUAL WELLNESS VISIT 1 [facility]   Loree Fee, PharmD Clinical Pharmacist [facility] [phone]

## 2023-03-05 ENCOUNTER — Other Ambulatory Visit (HOSPITAL_COMMUNITY): Payer: Self-pay

## 2023-03-05 ENCOUNTER — Other Ambulatory Visit: Payer: Self-pay

## 2023-03-05 MED FILL — Glucose Blood Test Strip: 25 days supply | Qty: 100 | Fill #1 | Status: CN

## 2023-03-06 ENCOUNTER — Other Ambulatory Visit: Payer: Self-pay

## 2023-03-20 ENCOUNTER — Other Ambulatory Visit: Payer: Self-pay

## 2023-03-26 ENCOUNTER — Ambulatory Visit (INDEPENDENT_AMBULATORY_CARE_PROVIDER_SITE_OTHER): Payer: Medicare Other | Admitting: Family Medicine

## 2023-03-26 ENCOUNTER — Other Ambulatory Visit: Payer: Self-pay

## 2023-03-26 ENCOUNTER — Encounter: Payer: Self-pay | Admitting: Family Medicine

## 2023-03-26 VITALS — BP 138/66 | HR 72 | Temp 97.7°F | Ht 61.0 in | Wt 185.0 lb

## 2023-03-26 DIAGNOSIS — Z794 Long term (current) use of insulin: Secondary | ICD-10-CM

## 2023-03-26 DIAGNOSIS — E1165 Type 2 diabetes mellitus with hyperglycemia: Secondary | ICD-10-CM | POA: Diagnosis not present

## 2023-03-26 DIAGNOSIS — Z79899 Other long term (current) drug therapy: Secondary | ICD-10-CM | POA: Diagnosis not present

## 2023-03-26 DIAGNOSIS — E1159 Type 2 diabetes mellitus with other circulatory complications: Secondary | ICD-10-CM

## 2023-03-26 DIAGNOSIS — Z8673 Personal history of transient ischemic attack (TIA), and cerebral infarction without residual deficits: Secondary | ICD-10-CM

## 2023-03-26 LAB — POCT GLYCOSYLATED HEMOGLOBIN (HGB A1C): Hemoglobin A1C: 9.2 % — AB (ref 4.0–5.6)

## 2023-03-26 MED ORDER — BASAGLAR KWIKPEN 100 UNIT/ML ~~LOC~~ SOPN
22.0000 [IU] | PEN_INJECTOR | Freq: Every day | SUBCUTANEOUS | 1 refills | Status: DC
  Filled 2023-03-26: qty 6, 27d supply, fill #0

## 2023-03-26 NOTE — Assessment & Plan Note (Addendum)
Chronic, deteriorated, attributes to holiday diet.  Encouraged portion control for carbs and sugars.  Will increase basaglar to 22u daily.  She declines DSME referral. She declines CGM use.  Reassess at 3 mo DM f/u visit

## 2023-03-26 NOTE — Progress Notes (Addendum)
Ph: [phone] Fax: [phone]   Patient ID: Kristen [name], female    DOB: 1941/03/08, 82 y.o.   MRN: [id number]  This visit was conducted in person.  BP 138/66   Pulse 72   Temp 97.7 F (36.5 C) (Oral)   Ht 5\' 1"  (1.549 m)   Wt 185 lb (83.9 kg)   SpO2 97%   BMI 34.96 kg/m    CC: 3 mo DM f/u visit  Subjective:   HPI: Kristen [name] is a 82 y.o. female presenting on 03/26/2023 for Medical Management of Chronic Issues (Here for 3 mo f/u and uACR. )   S/p pharmacist visit 01/2023.   Incidentally found bilateral chronic cerebellar infarcts. Continues atorvastatin and Plavix daily.   DM - does not regularly check sugars - once in a while - 80-100 in am, 200s in evenings. Denies symptoms of infection. Compliant with antihyperglycemic regimen which includes: basaglar 20u at bedtime. Denies low sugars or hypoglycemic symptoms. Denies paresthesias, blurry vision. Last diabetic eye exam 09/2022. Glucometer brand: one touch verio. Last foot exam: 11/2022. DSME: declined.  Declines CGM use at this time.  Lab Results  Component Value Date   HGBA1C 9.2 (A) 03/26/2023   Diabetic Foot Exam - Simple   No data filed    Lab Results  Component Value Date   MICROALBUR 11.5 (H) 05/30/2021         Relevant past medical, surgical, family and social history reviewed and updated as indicated. Interim medical history since our last visit reviewed. Allergies and medications reviewed and updated. Outpatient Medications Prior to Visit  Medication Sig Dispense Refill  . amLODipine (NORVASC) 5 MG tablet Take 1 tablet (5 mg total) by mouth daily. 90 tablet 1  . atorvastatin (LIPITOR) 40 MG tablet Take 1 tablet (40 mg total) by mouth daily. 90 tablet 1  . Blood Glucose Monitoring Suppl (ONETOUCH VERIO) w/Device KIT Use to check blood sugar up to 4 times a day as directed 1 kit 0  . Cholecalciferol (VITAMIN D) 2000 units CAPS Take 2,000 Units by mouth daily.    . clopidogrel (PLAVIX) 75 MG  tablet Take 1 tablet (75 mg total) by mouth every morning. 90 tablet 1  . colchicine 0.6 MG tablet Take 1 tablet (0.6 mg total) by mouth daily as needed for gout. 30 tablet 10  . cyclobenzaprine (FLEXERIL) 5 MG tablet Take 1 tablet (5 mg total) by mouth 2 (two) times daily as needed for muscle spasms (sedation precautions). 30 tablet 0  . cyclobenzaprine (FLEXERIL) 5 MG tablet Take 1 tablet (5 mg total) by mouth 3 (three) times daily as needed for up to 4 days. 12 tablet 0  . famotidine (PEPCID) 20 MG tablet Take 1 tablet (20 mg total) by mouth at bedtime.    Marland Kitchen glucose blood (ONETOUCH VERIO) test strip USE TO TEST BLOOD SUGAR 4 TIMES DAILY AS DIRECTED 100 each 10  . hydrALAZINE (APRESOLINE) 50 MG tablet Take 1 tablet (50 mg total) by mouth at breakfast and at bedtime. 60 tablet 10  . hydrochlorothiazide (HYDRODIURIL) 12.5 MG tablet Take 1 tablet (12.5 mg total) by mouth in the morning. 90 tablet 2  . Insulin Pen Needle (COMFORT EZ PEN NEEDLES) 31G X 5 MM MISC Use as instructed to inject insulin daily 100 each 3  . Insulin Pen Needle (COMFORT EZ PEN NEEDLES) 31G X 5 MM MISC Use as directed to inject insulin daily 100 each 3  . Lancets [facility] DELICA PLUS  LANCET33G) MISC Use as instructed to check blood sugar once a day 100 each 3  . losartan (COZAAR) 100 MG tablet Take 1 tablet (100 mg total) by mouth every morning. 90 tablet 1  . metoprolol succinate (TOPROL-XL) 25 MG 24 hr tablet Take 0.5 tablets (12.5 mg total) by mouth daily. 45 tablet 1  . Miconazole 2 % POWD Apply 1 Application topically 2 (two) times daily as needed (fungal skin infection). 43 g 1  . nitroGLYCERIN (NITROSTAT) 0.4 MG SL tablet Place 0.4 mg under the tongue every 5 (five) minutes as needed for chest pain.    Letta Pate Delica Lancets 33G MISC Use as directed to test blood sugar once daily. 100 each 1  . potassium chloride (KLOR-CON) 10 MEQ tablet Take 1 tablet (10 mEq total) by mouth every Monday, Wednesday, and Friday. 40  tablet 1  . tiZANidine (ZANAFLEX) 4 MG tablet Take 1 tablet (4 mg total) by mouth every 8 (eight) hours as needed for muscle spasms. 20 tablet 0  . Insulin Glargine (BASAGLAR KWIKPEN) 100 UNIT/ML Inject 20 Units into the skin at bedtime as directed. 15 mL 4   No facility-administered medications prior to visit.     Per HPI unless specifically indicated in ROS section below Review of Systems  Objective:  BP 138/66   Pulse 72   Temp 97.7 F (36.5 C) (Oral)   Ht 5\' 1"  (1.549 m)   Wt 185 lb (83.9 kg)   SpO2 97%   BMI 34.96 kg/m   Wt Readings from Last 3 Encounters:  03/26/23 185 lb (83.9 kg)  12/19/22 184 lb 6.4 oz (83.6 kg)  11/14/22 180 lb (81.6 kg)      Physical Exam Vitals and nursing note reviewed.  Constitutional:      Appearance: Normal appearance. She is not ill-appearing.  HENT:     Mouth/Throat:     Mouth: Mucous membranes are moist.     Pharynx: Oropharynx is clear. No oropharyngeal exudate or posterior oropharyngeal erythema.  Eyes:     Extraocular Movements: Extraocular movements intact.     Conjunctiva/sclera: Conjunctivae normal.     Pupils: Pupils are equal, round, and reactive to light.  Cardiovascular:     Rate and Rhythm: Normal rate and regular rhythm.     Pulses: Normal pulses.     Heart sounds: Normal heart sounds. No murmur heard. Pulmonary:     Effort: Pulmonary effort is normal. No respiratory distress.     Breath sounds: Normal breath sounds. No wheezing, rhonchi or rales.  Musculoskeletal:     Right lower leg: No edema.     Left lower leg: No edema.  Skin:    General: Skin is warm and dry.     Findings: No rash.  Neurological:     Mental Status: She is alert.  Psychiatric:        Mood and Affect: Mood normal.        Behavior: Behavior normal.      Results for orders placed or performed in visit on 03/26/23  POCT glycosylated hemoglobin (Hb A1C)  Result Value Ref Range   Hemoglobin A1C 9.2 (A) 4.0 - 5.6 %   HbA1c POC (<> result,  manual entry)     HbA1c, POC (prediabetic range)     HbA1c, POC (controlled diabetic range)      Assessment & Plan:   Problem List Items Addressed This Visit     Type 2 diabetes mellitus with hyperglycemia (HCC) - Primary  Chronic, deteriorated, attributes to holiday diet.  Encouraged portion control for carbs and sugars.  Will increase basaglar to 22u daily.  She declines DSME referral. She declines CGM use.  Reassess at 3 mo DM f/u visit      Relevant Medications   Insulin Glargine (BASAGLAR KWIKPEN) 100 UNIT/ML   Other Relevant Orders   POCT glycosylated hemoglobin (Hb A1C) (Completed)   Microalbumin / creatinine urine ratio   History of CVA (cerebrovascular accident)    Continue plavix and statin.       Polypharmacy    Appreciate pharmacist care - saw 01/2023.         Meds ordered this encounter  Medications  . Insulin Glargine (BASAGLAR KWIKPEN) 100 UNIT/ML    Sig: Inject 22 Units into the skin at bedtime.    Dispense:  30 mL    Refill:  1    Orders Placed This Encounter  Procedures  . Microalbumin / creatinine urine ratio  . POCT glycosylated hemoglobin (Hb A1C)    Patient Instructions  Sugar was higher this year.  Increase Basaglar to 22 units daily for the next 3 months.  Consider wearable continuous glucose monitor.  Good to see you today.  Return in 3 months for physical.   Follow up plan: Return in about 3 months (around 06/24/2023) for annual exam, prior fasting for blood work.  Eustaquio Boyden, MD

## 2023-03-26 NOTE — Assessment & Plan Note (Signed)
Continue plavix and statin

## 2023-03-26 NOTE — Addendum Note (Signed)
Addended by: Eustaquio Boyden on: 03/26/2023 02:53 PM   Modules accepted: Orders

## 2023-03-26 NOTE — Assessment & Plan Note (Signed)
Appreciate pharmacist care - saw 01/2023.

## 2023-03-26 NOTE — Patient Instructions (Addendum)
Sugar was higher this year.  Increase Basaglar to 22 units daily for the next 3 months.  Consider wearable continuous glucose monitor.  Good to see you today.  Return in 3 months for physical.

## 2023-03-27 LAB — MICROALBUMIN / CREATININE URINE RATIO
Creatinine,U: 283.1 mg/dL
Microalb Creat Ratio: 5.6 mg/g (ref 0.0–30.0)
Microalb, Ur: 15.9 mg/dL — ABNORMAL HIGH (ref 0.0–1.9)

## 2023-03-28 ENCOUNTER — Telehealth: Payer: Self-pay

## 2023-03-28 NOTE — Telephone Encounter (Signed)
Attempted to contact pt. No answer, no vm. Need to relay lab results and Dr Timoteo Expose message (see Labs, Result Notes- 03/26/23).  Labs/Dr G's msg: Your urine protein level was mildly elevated but overall ok. Continue losartan, which will help protect kidneys.

## 2023-03-29 NOTE — Telephone Encounter (Signed)
Spoke with pt relaying results and Dr. G's message.  Pt verbalizes understanding.  

## 2023-04-02 ENCOUNTER — Other Ambulatory Visit (HOSPITAL_COMMUNITY): Payer: Self-pay

## 2023-04-02 ENCOUNTER — Other Ambulatory Visit: Payer: Self-pay

## 2023-04-02 MED FILL — Colchicine Tab 0.6 MG: ORAL | 30 days supply | Qty: 30 | Fill #0 | Status: AC

## 2023-04-03 ENCOUNTER — Other Ambulatory Visit: Payer: Self-pay

## 2023-04-03 ENCOUNTER — Other Ambulatory Visit (HOSPITAL_COMMUNITY): Payer: Self-pay

## 2023-04-04 ENCOUNTER — Other Ambulatory Visit: Payer: Self-pay

## 2023-04-07 ENCOUNTER — Other Ambulatory Visit: Payer: Self-pay

## 2023-04-10 ENCOUNTER — Ambulatory Visit: Payer: Medicare Other

## 2023-04-16 ENCOUNTER — Ambulatory Visit: Payer: Medicare Other

## 2023-04-17 ENCOUNTER — Other Ambulatory Visit: Payer: Self-pay

## 2023-04-23 ENCOUNTER — Other Ambulatory Visit: Payer: Self-pay

## 2023-04-24 ENCOUNTER — Other Ambulatory Visit: Payer: Self-pay

## 2023-04-26 ENCOUNTER — Other Ambulatory Visit: Payer: Self-pay

## 2023-04-27 ENCOUNTER — Other Ambulatory Visit: Payer: Self-pay

## 2023-05-01 ENCOUNTER — Other Ambulatory Visit: Payer: Self-pay

## 2023-05-01 ENCOUNTER — Other Ambulatory Visit (HOSPITAL_COMMUNITY): Payer: Self-pay

## 2023-05-10 ENCOUNTER — Ambulatory Visit (INDEPENDENT_AMBULATORY_CARE_PROVIDER_SITE_OTHER): Payer: HMO

## 2023-05-10 VITALS — BP 126/78 | Ht 61.0 in | Wt 182.8 lb

## 2023-05-10 DIAGNOSIS — Z0000 Encounter for general adult medical examination without abnormal findings: Secondary | ICD-10-CM

## 2023-05-10 NOTE — Progress Notes (Signed)
Subjective:   Kristen [name] is a 83 y.o. female who presents for Medicare Annual (Subsequent) preventive examination.  Visit Complete: In person  Patient Medicare AWV questionnaire was completed by the patient on (not done); I have confirmed that all information answered by patient is correct and no changes since this date.  Cardiac Risk Factors include: advanced age (>53men, >92 women);diabetes mellitus;dyslipidemia;hypertension;obesity (BMI >30kg/m2);sedentary lifestyle    Objective:    Today's Vitals   05/10/23 1121  BP: 126/78  Weight: 182 lb 12.8 oz (82.9 kg)  Height: 5\' 1"  (1.549 m)  PainSc: 0-No pain   Body mass index is 34.54 kg/m.     05/10/2023   11:31 AM 11/14/2022    7:49 AM 04/13/2022    1:06 PM 04/05/2021   12:36 PM 04/08/2020    1:35 PM 03/31/2020   10:33 AM 03/14/2019    2:50 PM  Advanced Directives  Does Patient Have a Medical Advance Directive? Yes Yes Yes Yes Yes Yes Yes  Type of Estate agent of [place];Living will Living will Healthcare Power of [place];Living will Healthcare Power of [place];Living will Healthcare Power of [place];Living will Healthcare Power of [place];Living will Healthcare Power of [place];Living will  Does patient want to make changes to medical advance directive?   No - Patient declined Yes (MAU/Ambulatory/Procedural Areas - Information given) No - Patient declined    Copy of Healthcare Power of Attorney in Chart? Yes - validated most recent copy scanned in chart (See row information)  Yes - validated most recent copy scanned in chart (See row information) Yes - validated most recent copy scanned in chart (See row information)  Yes - validated most recent copy scanned in chart (See row information) Yes - validated most recent copy scanned in chart (See row information)    Current Medications (verified) Outpatient Encounter Medications as of 05/10/2023  Medication Sig   amLODipine (NORVASC) 5 MG tablet  Take 1 tablet (5 mg total) by mouth daily.   atorvastatin (LIPITOR) 40 MG tablet Take 1 tablet (40 mg total) by mouth daily.   Blood Glucose Monitoring Suppl (ONETOUCH VERIO) w/Device KIT Use to check blood sugar up to 4 times a day as directed   Cholecalciferol (VITAMIN D) 2000 units CAPS Take 2,000 Units by mouth daily.   clopidogrel (PLAVIX) 75 MG tablet Take 1 tablet (75 mg total) by mouth every morning.   colchicine 0.6 MG tablet Take 1 tablet (0.6 mg total) by mouth daily as needed for gout.   cyclobenzaprine (FLEXERIL) 5 MG tablet Take 1 tablet (5 mg total) by mouth 2 (two) times daily as needed for muscle spasms (sedation precautions).   cyclobenzaprine (FLEXERIL) 5 MG tablet Take 1 tablet (5 mg total) by mouth 3 (three) times daily as needed for up to 4 days.   famotidine (PEPCID) 20 MG tablet Take 1 tablet (20 mg total) by mouth at bedtime.   glucose blood (ONETOUCH VERIO) test strip USE TO TEST BLOOD SUGAR 4 TIMES DAILY AS DIRECTED   hydrALAZINE (APRESOLINE) 50 MG tablet Take 1 tablet (50 mg total) by mouth at breakfast and at bedtime.   hydrochlorothiazide (HYDRODIURIL) 12.5 MG tablet Take 1 tablet (12.5 mg total) by mouth in the morning.   Insulin Glargine (BASAGLAR KWIKPEN) 100 UNIT/ML Inject 22 Units into the skin at bedtime.   Insulin Pen Needle (COMFORT EZ PEN NEEDLES) 31G X 5 MM MISC Use as instructed to inject insulin daily   Insulin Pen Needle (COMFORT EZ PEN  NEEDLES) 31G X 5 MM MISC Use as directed to inject insulin daily   Lancets (ONETOUCH DELICA PLUS LANCET33G) MISC Use as instructed to check blood sugar once a day   losartan (COZAAR) 100 MG tablet Take 1 tablet (100 mg total) by mouth every morning.   metoprolol succinate (TOPROL-XL) 25 MG 24 hr tablet Take 0.5 tablets (12.5 mg total) by mouth daily.   Miconazole 2 % POWD Apply 1 Application topically 2 (two) times daily as needed (fungal skin infection).   nitroGLYCERIN (NITROSTAT) 0.4 MG SL tablet Place 0.4 mg under the  tongue every 5 (five) minutes as needed for chest pain.   OneTouch Delica Lancets 33G MISC Use as directed to test blood sugar once daily.   potassium chloride (KLOR-CON) 10 MEQ tablet Take 1 tablet (10 mEq total) by mouth every Monday, Wednesday, and Friday.   tiZANidine (ZANAFLEX) 4 MG tablet Take 1 tablet (4 mg total) by mouth every 8 (eight) hours as needed for muscle spasms.   No facility-administered encounter medications on file as of 05/10/2023.    Allergies (verified) Bee venom, Peanut-containing drug products, Aleve [naproxen sodium], Doxycycline, Keflex [cephalexin], Metformin and related, and Penicillins   History: Past Medical History:  Diagnosis Date   Abdominal aortic atherosclerosis (HCC) by CT 02/2014   Acute kidney injury (HCC) 02/29/2016   During hospitalization 07/2017   CAD (coronary artery disease)    by CT, per pt h/o MI   Cataract    bilateral-removed   Clotting disorder (HCC)    h/o PE   Diabetes type 2, uncontrolled    Frequent headaches    GERD (gastroesophageal reflux disease)    Gout    H. pylori infection 2022   s/p treatment with Pylera ([facility])   History of pulmonary embolism 2012   HLD (hyperlipidemia)    HTN (hypertension)    Internal capsule hemorrhage (HCC)    hx of sublacunar infarct involving the right posterior limb of the internal capsule    Morbid obesity (HCC)    Myocardial infarction (HCC) 2012   per pt. report, states she was treated with medicine, here at [facility]     Osteoarthritis    knees   Primary localized osteoarthritis of left knee 09/22/2014   Risk for falls 09/28/2017   Sleep apnea 2011   study done in [place], states that since she lost weight she doesn't use the CPAP any longer & she doesn't have a problem with sleep apnea   Stroke [facility])    still has balance problem on occas. , uses cane but that's mainly for the left knee pain   Syncope 03/01/2016   Thoracic aortic atherosclerosis (HCC) 12/2015   by CXR    Vertigo    hx. benign postitional postural   Past Surgical History:  Procedure Laterality Date   CATARACT EXTRACTION Bilateral 04/25/2011   COLONOSCOPY  07/2020   done for positive iFOB - TA and TVA (large) consider rpt 3 yrs ([facility])   ESOPHAGOGASTRODUODENOSCOPY  07/2020   H pylori positive (treated), dilation of upper esoph stricture   EYE SURGERY     /w IOL   FOOT SURGERY Right    PARTIAL HYSTERECTOMY     for fibroids, ovaries remain   TONSILLECTOMY     TOTAL KNEE ARTHROPLASTY Right 1990s   TOTAL KNEE ARTHROPLASTY Left 09/22/2014   Teryl Lucy, MD   TUBAL LIGATION     Family History  Problem Relation Age of Onset   Cancer Mother  bone   Diabetes Father    Hypertension Father    Cancer Son 79       lung   Congenital heart disease Son 34   Stroke Brother    Colon cancer Neg Hx    Colon polyps Neg Hx    Esophageal cancer Neg Hx    Stomach cancer Neg Hx    Rectal cancer Neg Hx    Social History   Socioeconomic History   Marital status: Widowed    Spouse name: Not on file   Number of children: 5   Years of education: Not on file   Highest education level: Not on file  Occupational History   Occupation: retired  Tobacco Use   Smoking status: Never   Smokeless tobacco: Never   Tobacco comments:    tobacco use- no   Vaping Use   Vaping status: Never Used  Substance and Sexual Activity   Alcohol use: Not Currently   Drug use: No   Sexual activity: Not on file  Other Topics Concern   Not on file  Social History Narrative   Lives alone   4 grown children   Education: 11th grade   Occupation: retired, was Higher education careers adviser   Activity: Does not regularly exercise.    Diet: good water, fruits/vegetables seldom   Transportation: Patient has been educated on her transportation benefit through [facility]   Social Drivers of Health   Financial Resource Strain: Low Risk  (05/10/2023)   Overall Financial Resource Strain (CARDIA)    Difficulty of Paying  Living Expenses: Not hard at all  Food Insecurity: No Food Insecurity (05/10/2023)   Hunger Vital Sign    Worried About Running Out of Food in the Last Year: Never true    Ran Out of Food in the Last Year: Never true  Transportation Needs: No Transportation Needs (05/10/2023)   PRAPARE - Administrator, Civil Service (Medical): No    Lack of Transportation (Non-Medical): No  Physical Activity: Inactive (05/10/2023)   Exercise Vital Sign    Days of Exercise per Week: 0 days    Minutes of Exercise per Session: 0 min  Stress: No Stress Concern Present (05/10/2023)   [employer] of Occupational Health - Occupational Stress Questionnaire    Feeling of Stress : Not at all  Social Connections: Moderately Isolated (05/10/2023)   Social Connection and Isolation Panel [NHANES]    Frequency of Communication with Friends and Family: More than three times a week    Frequency of Social Gatherings with Friends and Family: More than three times a week    Attends Religious Services: More than 4 times per year    Active Member of [employer] or Organizations: No    Attends Banker Meetings: Never    Marital Status: Widowed    Tobacco Counseling Counseling given: Not Answered Tobacco comments: tobacco use- no    Clinical Intake:  Pre-visit preparation completed: Yes  Pain : No/denies pain Pain Score: 0-No pain   BMI - recorded: 34.54 Nutritional Status: BMI > 30  Obese Nutritional Risks: None Diabetes: Yes CBG done?: Yes (BS 160's this am at home) Did pt. bring in CBG monitor from home?: No  How often do you need to have someone help you when you read instructions, pamphlets, or other written materials from your doctor or pharmacy?: 1 - Never  Interpreter Needed?: No  Comments: lives alone Information entered by :: B.Anija Brickner,LPN   Activities of Daily Living  05/10/2023   11:32 AM  In your present state of health, do you have any difficulty performing the  following activities:  Hearing? 1  Vision? 0  Difficulty concentrating or making decisions? 1  Walking or climbing stairs? 0  Dressing or bathing? 0  Doing errands, shopping? 0  Preparing Food and eating ? N  Using the Toilet? N  In the past six months, have you accidently leaked urine? N  Do you have problems with loss of bowel control? N  Managing your Medications? N  Managing your Finances? N  Housekeeping or managing your Housekeeping? N    Patient Care Team: Eustaquio Boyden, MD as PCP - General (Family Medicine) Mariah Milling Tollie Pizza, MD as Consulting Physician (Cardiology) [place], Dennard Schaumann, RN as [facility] [place], Garry Heater, [facility] (Inactive) as Pharmacist (Pharmacist)  Indicate any recent Medical Services you may have received from other than [facility] providers in the past year (date may be approximate).     Assessment:   This is a routine wellness examination for Naydelin.  Hearing/Vision screen Hearing Screening - Comments:: Pt says a little hearing loss but hears fine Vision Screening - Comments:: Pt says wears readers only Dr Dione Booze   Goals Addressed             This Visit's Progress    chronic health management   On track    Starting 03/12/2018, I will continue to take medications as prescribed to manage health conditions.     COMPLETED: Patient Stated   On track    03/14/2019, I will maintain and continue medications as prescribed.      Patient Stated   On track    03/31/2020, I will maintain and continue medications as prescribed.      COMPLETED: Patient Stated   On track    Would like maintain current routine       Depression Screen    05/10/2023   11:28 AM 07/26/2022   10:14 AM 06/07/2022   10:38 AM 04/13/2022    1:04 PM 04/05/2021   12:39 PM 04/08/2020    1:35 PM 03/31/2020   10:37 AM  PHQ 2/9 Scores  PHQ - 2 Score 0  0 0 0 0 0  PHQ- 9 Score       0  Exception Documentation  Patient refusal         Fall Risk     05/10/2023   11:26 AM 12/19/2022    9:59 AM 06/07/2022   10:38 AM 04/13/2022    1:16 PM 04/05/2021   12:37 PM  Fall Risk   Falls in the past year? 0 0 0 0 0  Number falls in past yr: 0 0  0 0  Injury with Fall? 0 0   0  Risk for fall due to : No Fall Risks No Fall Risks   No Fall Risks  Follow up Education provided;Falls prevention discussed Falls evaluation completed  Falls evaluation completed Falls prevention discussed    MEDICARE RISK AT HOME: Medicare Risk at Home Any stairs in or around the home?: No If so, are there any without handrails?: No Home free of loose throw rugs in walkways, pet beds, electrical cords, etc?: Yes Adequate lighting in your home to reduce risk of falls?: Yes Life alert?: No Use of a cane, walker or w/c?: No Grab bars in the bathroom?: Yes Shower chair or bench in shower?: Yes Elevated toilet seat or a handicapped toilet?: Yes  TIMED UP  AND GO:  Was the test performed?  Yes  Length of time to ambulate 10 feet: 12 sec Gait slow and steady without use of assistive device    Cognitive Function:    03/31/2020   10:40 AM 03/14/2019    2:55 PM 03/12/2018    9:27 AM 08/09/2016   10:05 AM  MMSE - Mini Mental State Exam  Orientation to time 5 5 5 5   Orientation to Place 5 5 5 5   Registration 3 3 3 3   Attention/ Calculation 5 0 0 0  Recall 3 3 3 3   Language- name 2 objects   0 0  Language- repeat 1 1 1 1   Language- follow 3 step command   2 3  Language- follow 3 step command-comments   unable to follow 1 step of 3 step command   Language- read & follow direction   0 0  Write a sentence   0 0  Copy design   0 0  Total score   19 20        05/10/2023   11:36 AM 04/13/2022    1:18 PM  6CIT Screen  What Year? 0 points 0 points  What month? 0 points 0 points  What time? 0 points 0 points  Count back from 20 4 points 0 points  Months in reverse 4 points 0 points  Repeat phrase 2 points 0 points  Total Score 10 points 0 points     Immunizations Immunization History  Administered Date(s) Administered   Fluad Quad(high Dose 65+) 03/30/2020   PFIZER Comirnaty(Gray Top)Covid-19 Tri-Sucrose Vaccine 06/22/2020, 07/13/2020   PPD Test 09/25/2014   Pneumococcal Conjugate-13 09/10/2013   Pneumococcal Polysaccharide-23 03/10/2015   Zoster Recombinant(Shingrix) 07/15/2021   Zoster, Live 04/21/2014    TDAP status: Up to date  Flu Vaccine status: Declined, Education has been provided regarding the importance of this vaccine but patient still declined. Advised may receive this vaccine at local pharmacy or Health Dept. Aware to provide a copy of the vaccination record if obtained from local pharmacy or Health Dept. Verbalized acceptance and understanding.  Pneumococcal vaccine status: Up to date  Covid-19 vaccine status: Completed vaccines  Qualifies for Shingles Vaccine? Yes   Zostavax completed Yes  #1 Shingrix Completed?: No.    Education has been provided regarding the importance of this vaccine. Patient has been advised to call insurance company to determine out of pocket expense if they have not yet received this vaccine. Advised may also receive vaccine at local pharmacy or Health Dept. Verbalized acceptance and understanding.  Screening Tests Health Maintenance  Topic Date Due   COVID-19 Vaccine (3 - Pfizer risk series) 08/10/2020   Zoster Vaccines- Shingrix (2 of 2) 09/09/2021   INFLUENZA VACCINE  07/23/2023 (Originally 11/23/2022)   DTaP/Tdap/Td (1 - Tdap) 08/10/2026 (Originally 08/07/1959)   HEMOGLOBIN A1C  09/24/2023   OPHTHALMOLOGY EXAM  10/18/2023   Diabetic kidney evaluation - eGFR measurement  12/19/2023   FOOT EXAM  12/19/2023   Diabetic kidney evaluation - Urine ACR  03/25/2024   Medicare Annual Wellness (AWV)  05/09/2024   Pneumonia Vaccine 24+ Years old  Completed   DEXA SCAN  Completed   HPV VACCINES  Aged Out   Hepatitis C Screening  Discontinued    Health Maintenance  Health Maintenance  Due  Topic Date Due   COVID-19 Vaccine (3 - Pfizer risk series) 08/10/2020   Zoster Vaccines- Shingrix (2 of 2) 09/09/2021    Colorectal cancer screening: No longer  required.   Mammogram status: No longer required due to age.  Bone Density status: Completed 04/21/2015. Results reflect: Bone density results: NORMAL. Repeat every 5 years.  Lung Cancer Screening: (Low Dose CT Chest recommended if Age 29-80 years, 20 pack-year currently smoking OR have quit w/in 15years.) does not qualify.   Lung Cancer Screening Referral: no  Additional Screening:  Hepatitis C Screening: does not qualify; Completed 03/30/2020  Vision Screening: Recommended annual ophthalmology exams for early detection of glaucoma and other disorders of the eye. Is the patient up to date with their annual eye exam?  Yes  Who is the provider or what is the name of the office in which the patient attends annual eye exams? Dr Dione Booze If pt is not established with a provider, would they like to be referred to a provider to establish care? No .   Dental Screening: Recommended annual dental exams for proper oral hygiene  Diabetic Foot Exam: Diabetic Foot Exam: Completed 11/2022  Community Resource Referral / Chronic Care Management: CRR required this visit?  No   CCM required this visit?  No    Plan:    I have personally reviewed and noted the following in the patient's chart:   Medical and social history Use of alcohol, tobacco or illicit drugs  Current medications and supplements including opioid prescriptions. Patient is not currently taking opioid prescriptions. Functional ability and status Nutritional status Physical activity Advanced directives List of other physicians Hospitalizations, surgeries, and ER visits in previous 12 months Vitals Screenings to include cognitive, depression, and falls Referrals and appointments  In addition, I have reviewed and discussed with patient certain preventive  protocols, quality metrics, and best practice recommendations. A written personalized care plan for preventive services as well as general preventive health recommendations were provided to patient.    Sue Lush, LPN   1/61/0960   After Visit Summary: (In Person-Printed) AVS printed and given to the patient  Nurse Notes: The patient states she is doing well and has no concerns or questions at this time.

## 2023-05-10 NOTE — Patient Instructions (Addendum)
Kristen [name] , Thank you for taking time to come for your Medicare Wellness Visit. I appreciate your ongoing commitment to your health goals. Please review the following plan we discussed and let me know if I can assist you in the future.   Referrals/Orders/Follow-Ups/Clinician Recommendations: none  This is a list of the screening recommended for you and due dates:  Health Maintenance  Topic Date Due   COVID-19 Vaccine (3 - Pfizer risk series) 08/10/2020   Zoster (Shingles) Vaccine (2 of 2) 09/09/2021   Flu Shot  07/23/2023*   DTaP/Tdap/Td vaccine (1 - Tdap) 08/10/2026*   Hemoglobin A1C  09/24/2023   Eye exam for diabetics  10/18/2023   Yearly kidney function blood test for diabetes  12/19/2023   Complete foot exam   12/19/2023   Yearly kidney health urinalysis for diabetes  03/25/2024   Medicare Annual Wellness Visit  05/09/2024   Pneumonia Vaccine  Completed   DEXA scan (bone density measurement)  Completed   HPV Vaccine  Aged Out   Hepatitis C Screening  Discontinued  *Topic was postponed. The date shown is not the original due date.    Advanced directives: (In Chart) A copy of your advanced directives are scanned into your chart should your provider ever need it.  Next Medicare Annual Wellness Visit scheduled for next year: Yes 06/20/2024 @11 :30am in person

## 2023-05-13 ENCOUNTER — Other Ambulatory Visit: Payer: Self-pay | Admitting: Family Medicine

## 2023-05-17 ENCOUNTER — Telehealth: Payer: Self-pay

## 2023-05-17 NOTE — Telephone Encounter (Signed)
Copied from CRM [id number]. Topic: Clinical - Medication Question >> May 17, 2023 12:39 PM Corin V wrote: Reason for CRM: Patient has a cough and possible infection. Agent attempted to schedule patient for a cute visit today or tomorrow and offered variety of provider names. Patient did not want to schedule appointment and asked that Dr. Sharen Hones call in a prescription for her cough. Agent advised that PCP may not send any Rx in until they are seen in case they need to get a culture to check for a viral or bacterial infection before something could be sent in. Patient verbalized understanding and still requested that agent request an Rx be written instead of being seen.

## 2023-05-17 NOTE — Telephone Encounter (Signed)
Spoke with patient and tried to schedule her an appointment but nothing open with Dr. Reece Agar til Monday. Patient refused to see anyone else after several attempts I made to schedule her. She stated she will go to a walking in clinic or ER if cough gets worse this weekend. Sending to PCP as FYI.

## 2023-05-18 NOTE — Telephone Encounter (Addendum)
Noted. Unfortunately I have no appts available tomorrow.  How long has cough been going on, any fever?  Would again offer she see another provider in office tomorrow for symptoms.

## 2023-05-18 NOTE — Telephone Encounter (Signed)
Spoke with pt asking cough and fever. States cough started 05/16/23 but denies fever. Had runny nose but has cleared up and cough is improving. Pt still declines OV with other any other providers.

## 2023-05-18 NOTE — Telephone Encounter (Signed)
Glad it's getting better. She may call back to schedule appt if symptoms again worsen.

## 2023-05-21 ENCOUNTER — Other Ambulatory Visit: Payer: Self-pay

## 2023-05-21 NOTE — Telephone Encounter (Signed)
Spoke with pt relaying Dr Timoteo Expose message. Pt verbalizes understanding and states she feels much better today.

## 2023-05-22 ENCOUNTER — Other Ambulatory Visit: Payer: Self-pay

## 2023-06-03 ENCOUNTER — Inpatient Hospital Stay
Admission: EM | Admit: 2023-06-03 | Discharge: 2023-06-05 | DRG: 683 | Disposition: A | Discharge status: Home / Self Care | Payer: PPO | Attending: Internal Medicine | Admitting: Internal Medicine

## 2023-06-03 ENCOUNTER — Emergency Department: Payer: PPO

## 2023-06-03 DIAGNOSIS — J101 Influenza due to other identified influenza virus with other respiratory manifestations: Secondary | ICD-10-CM | POA: Diagnosis present

## 2023-06-03 DIAGNOSIS — I252 Old myocardial infarction: Secondary | ICD-10-CM | POA: Diagnosis not present

## 2023-06-03 DIAGNOSIS — N179 Acute kidney failure, unspecified: Principal | ICD-10-CM | POA: Diagnosis present

## 2023-06-03 DIAGNOSIS — R059 Cough, unspecified: Secondary | ICD-10-CM | POA: Diagnosis not present

## 2023-06-03 DIAGNOSIS — Z881 Allergy status to other antibiotic agents status: Secondary | ICD-10-CM

## 2023-06-03 DIAGNOSIS — Z6834 Body mass index (BMI) 34.0-34.9, adult: Secondary | ICD-10-CM | POA: Diagnosis not present

## 2023-06-03 DIAGNOSIS — Z794 Long term (current) use of insulin: Secondary | ICD-10-CM

## 2023-06-03 DIAGNOSIS — E878 Other disorders of electrolyte and fluid balance, not elsewhere classified: Secondary | ICD-10-CM | POA: Diagnosis present

## 2023-06-03 DIAGNOSIS — Z96653 Presence of artificial knee joint, bilateral: Secondary | ICD-10-CM | POA: Diagnosis not present

## 2023-06-03 DIAGNOSIS — R0989 Other specified symptoms and signs involving the circulatory and respiratory systems: Secondary | ICD-10-CM | POA: Diagnosis not present

## 2023-06-03 DIAGNOSIS — Z79899 Other long term (current) drug therapy: Secondary | ICD-10-CM

## 2023-06-03 DIAGNOSIS — E6609 Other obesity due to excess calories: Secondary | ICD-10-CM | POA: Diagnosis not present

## 2023-06-03 DIAGNOSIS — N189 Chronic kidney disease, unspecified: Secondary | ICD-10-CM | POA: Diagnosis present

## 2023-06-03 DIAGNOSIS — Z90711 Acquired absence of uterus with remaining cervical stump: Secondary | ICD-10-CM

## 2023-06-03 DIAGNOSIS — Z888 Allergy status to other drugs, medicaments and biological substances status: Secondary | ICD-10-CM

## 2023-06-03 DIAGNOSIS — E66811 Obesity, class 1: Secondary | ICD-10-CM | POA: Diagnosis present

## 2023-06-03 DIAGNOSIS — B974 Respiratory syncytial virus as the cause of diseases classified elsewhere: Secondary | ICD-10-CM | POA: Diagnosis not present

## 2023-06-03 DIAGNOSIS — K219 Gastro-esophageal reflux disease without esophagitis: Secondary | ICD-10-CM | POA: Diagnosis present

## 2023-06-03 DIAGNOSIS — M109 Gout, unspecified: Secondary | ICD-10-CM | POA: Diagnosis present

## 2023-06-03 DIAGNOSIS — E871 Hypo-osmolality and hyponatremia: Secondary | ICD-10-CM | POA: Diagnosis present

## 2023-06-03 DIAGNOSIS — Z66 Do not resuscitate: Secondary | ICD-10-CM | POA: Diagnosis present

## 2023-06-03 DIAGNOSIS — Z1152 Encounter for screening for COVID-19: Secondary | ICD-10-CM | POA: Diagnosis not present

## 2023-06-03 DIAGNOSIS — J121 Respiratory syncytial virus pneumonia: Secondary | ICD-10-CM | POA: Diagnosis not present

## 2023-06-03 DIAGNOSIS — E860 Dehydration: Secondary | ICD-10-CM | POA: Diagnosis present

## 2023-06-03 DIAGNOSIS — J40 Bronchitis, not specified as acute or chronic: Secondary | ICD-10-CM

## 2023-06-03 DIAGNOSIS — I129 Hypertensive chronic kidney disease with stage 1 through stage 4 chronic kidney disease, or unspecified chronic kidney disease: Secondary | ICD-10-CM | POA: Diagnosis not present

## 2023-06-03 DIAGNOSIS — E876 Hypokalemia: Secondary | ICD-10-CM | POA: Diagnosis present

## 2023-06-03 DIAGNOSIS — E1165 Type 2 diabetes mellitus with hyperglycemia: Secondary | ICD-10-CM | POA: Diagnosis not present

## 2023-06-03 DIAGNOSIS — I2510 Atherosclerotic heart disease of native coronary artery without angina pectoris: Secondary | ICD-10-CM | POA: Diagnosis present

## 2023-06-03 DIAGNOSIS — E1169 Type 2 diabetes mellitus with other specified complication: Secondary | ICD-10-CM

## 2023-06-03 DIAGNOSIS — I10 Essential (primary) hypertension: Secondary | ICD-10-CM

## 2023-06-03 DIAGNOSIS — Z86711 Personal history of pulmonary embolism: Secondary | ICD-10-CM

## 2023-06-03 DIAGNOSIS — Z91030 Bee allergy status: Secondary | ICD-10-CM

## 2023-06-03 DIAGNOSIS — R739 Hyperglycemia, unspecified: Secondary | ICD-10-CM

## 2023-06-03 DIAGNOSIS — E785 Hyperlipidemia, unspecified: Secondary | ICD-10-CM | POA: Diagnosis present

## 2023-06-03 DIAGNOSIS — Z833 Family history of diabetes mellitus: Secondary | ICD-10-CM | POA: Diagnosis not present

## 2023-06-03 DIAGNOSIS — Z880 Allergy status to penicillin: Secondary | ICD-10-CM

## 2023-06-03 DIAGNOSIS — Z8249 Family history of ischemic heart disease and other diseases of the circulatory system: Secondary | ICD-10-CM

## 2023-06-03 DIAGNOSIS — I1A0 Resistant hypertension: Secondary | ICD-10-CM

## 2023-06-03 DIAGNOSIS — E1122 Type 2 diabetes mellitus with diabetic chronic kidney disease: Secondary | ICD-10-CM | POA: Diagnosis present

## 2023-06-03 DIAGNOSIS — I700 Atherosclerosis of aorta: Secondary | ICD-10-CM | POA: Diagnosis not present

## 2023-06-03 DIAGNOSIS — Z8673 Personal history of transient ischemic attack (TIA), and cerebral infarction without residual deficits: Secondary | ICD-10-CM

## 2023-06-03 DIAGNOSIS — Z823 Family history of stroke: Secondary | ICD-10-CM

## 2023-06-03 DIAGNOSIS — R062 Wheezing: Secondary | ICD-10-CM | POA: Diagnosis present

## 2023-06-03 DIAGNOSIS — R0602 Shortness of breath: Secondary | ICD-10-CM | POA: Diagnosis not present

## 2023-06-03 DIAGNOSIS — Z91010 Allergy to peanuts: Secondary | ICD-10-CM

## 2023-06-03 DIAGNOSIS — B338 Other specified viral diseases: Secondary | ICD-10-CM

## 2023-06-03 DIAGNOSIS — M79605 Pain in left leg: Secondary | ICD-10-CM | POA: Diagnosis not present

## 2023-06-03 DIAGNOSIS — R053 Chronic cough: Secondary | ICD-10-CM | POA: Diagnosis present

## 2023-06-03 DIAGNOSIS — R531 Weakness: Principal | ICD-10-CM

## 2023-06-03 LAB — COMPREHENSIVE METABOLIC PANEL
ALT: 18 U/L (ref 0–44)
AST: 29 U/L (ref 15–41)
Albumin: 3.2 g/dL — ABNORMAL LOW (ref 3.5–5.0)
Alkaline Phosphatase: 74 U/L (ref 38–126)
Anion gap: 14 (ref 5–15)
BUN: 45 mg/dL — ABNORMAL HIGH (ref 8–23)
CO2: 24 mmol/L (ref 22–32)
Calcium: 8.8 mg/dL — ABNORMAL LOW (ref 8.9–10.3)
Chloride: 92 mmol/L — ABNORMAL LOW (ref 98–111)
Creatinine, Ser: 1.94 mg/dL — ABNORMAL HIGH (ref 0.44–1.00)
GFR, Estimated: 26 mL/min — ABNORMAL LOW (ref >60)
Glucose, Bld: 511 mg/dL (ref 70–99)
Potassium: 4 mmol/L (ref 3.5–5.1)
Sodium: 130 mmol/L — ABNORMAL LOW (ref 135–145)
Total Bilirubin: 1.3 mg/dL — ABNORMAL HIGH (ref 0.0–1.2)
Total Protein: 6.7 g/dL (ref 6.5–8.1)

## 2023-06-03 LAB — CBC WITH DIFFERENTIAL/PLATELET
Abs Immature Granulocytes: 0.04 10*3/uL (ref 0.00–0.07)
Basophils Absolute: 0 10*3/uL (ref 0.0–0.1)
Basophils Relative: 0 %
Eosinophils Absolute: 0 10*3/uL (ref 0.0–0.5)
Eosinophils Relative: 0 %
HCT: 33.8 % — ABNORMAL LOW (ref 36.0–46.0)
Hemoglobin: 10.8 g/dL — ABNORMAL LOW (ref 12.0–15.0)
Immature Granulocytes: 1 %
Lymphocytes Relative: 30 %
Lymphs Abs: 1.9 10*3/uL (ref 0.7–4.0)
MCH: 27.8 pg (ref 26.0–34.0)
MCHC: 32 g/dL (ref 30.0–36.0)
MCV: 86.9 fL (ref 80.0–100.0)
Monocytes Absolute: 0.3 10*3/uL (ref 0.1–1.0)
Monocytes Relative: 5 %
Neutro Abs: 3.9 10*3/uL (ref 1.7–7.7)
Neutrophils Relative %: 64 %
Platelets: 163 10*3/uL (ref 150–400)
RBC: 3.89 MIL/uL (ref 3.87–5.11)
RDW: 14.3 % (ref 11.5–15.5)
Smear Review: NORMAL
WBC: 6.1 10*3/uL (ref 4.0–10.5)
nRBC: 0 % (ref 0.0–0.2)

## 2023-06-03 LAB — RESP PANEL BY RT-PCR (RSV, FLU A&B, COVID)  RVPGX2
Influenza A by PCR: POSITIVE — AB
Influenza B by PCR: NEGATIVE
Resp Syncytial Virus by PCR: POSITIVE — AB
SARS Coronavirus 2 by RT PCR: NEGATIVE

## 2023-06-03 LAB — CBG MONITORING, ED
Glucose-Capillary: 289 mg/dL — ABNORMAL HIGH (ref 70–99)
Glucose-Capillary: 418 mg/dL — ABNORMAL HIGH (ref 70–99)
Glucose-Capillary: 502 mg/dL (ref 70–99)

## 2023-06-03 LAB — TROPONIN I (HIGH SENSITIVITY)
Troponin I (High Sensitivity): 34 ng/L — ABNORMAL HIGH (ref <18)
Troponin I (High Sensitivity): 37 ng/L — ABNORMAL HIGH (ref <18)

## 2023-06-03 MED ORDER — COLCHICINE 0.6 MG PO TABS: 0.6000 mg | ORAL_TABLET | Freq: Every day | ORAL | Status: DC | PRN

## 2023-06-03 MED ORDER — SODIUM CHLORIDE 0.9 % IV SOLN: INTRAVENOUS | Status: AC

## 2023-06-03 MED ORDER — SODIUM CHLORIDE 0.9 % IV BOLUS
1000.0000 mL | Freq: Once | INTRAVENOUS | Status: AC
  Administered 2023-06-03: 1000 mL via INTRAVENOUS

## 2023-06-03 MED ORDER — AMLODIPINE BESYLATE 5 MG PO TABS
5.0000 mg | ORAL_TABLET | Freq: Every day | ORAL | Status: DC
  Administered 2023-06-04: 5 mg via ORAL
  Filled 2023-06-03: qty 1

## 2023-06-03 MED ORDER — METOPROLOL SUCCINATE ER 25 MG PO TB24
12.5000 mg | ORAL_TABLET | Freq: Every day | ORAL | Status: DC
  Administered 2023-06-04 – 2023-06-05 (×2): 12.5 mg via ORAL
  Filled 2023-06-03 (×2): qty 1

## 2023-06-03 MED ORDER — NITROGLYCERIN 0.4 MG SL SUBL: 0.4000 mg | SUBLINGUAL_TABLET | SUBLINGUAL | Status: DC | PRN

## 2023-06-03 MED ORDER — INSULIN ASPART 100 UNIT/ML IJ SOLN
8.0000 [IU] | Freq: Once | INTRAMUSCULAR | Status: AC
  Administered 2023-06-03: 8 [IU] via INTRAVENOUS
  Filled 2023-06-03: qty 1

## 2023-06-03 MED ORDER — ONDANSETRON HCL 4 MG/2ML IJ SOLN: 4.0000 mg | Freq: Four times a day (QID) | INTRAMUSCULAR | Status: DC | PRN

## 2023-06-03 MED ORDER — HYDROCOD POLI-CHLORPHE POLI ER 10-8 MG/5ML PO SUER: 5.0000 mL | Freq: Two times a day (BID) | ORAL | Status: DC | PRN

## 2023-06-03 MED ORDER — FAMOTIDINE 20 MG PO TABS
20.0000 mg | ORAL_TABLET | Freq: Every day | ORAL | Status: DC
  Administered 2023-06-03 – 2023-06-04 (×2): 20 mg via ORAL
  Filled 2023-06-03 (×2): qty 1

## 2023-06-03 MED ORDER — ENOXAPARIN SODIUM 30 MG/0.3ML IJ SOSY
30.0000 mg | PREFILLED_SYRINGE | INTRAMUSCULAR | Status: DC
  Administered 2023-06-04 – 2023-06-05 (×2): 30 mg via SUBCUTANEOUS
  Filled 2023-06-03 (×2): qty 0.3

## 2023-06-03 MED ORDER — VITAMIN D3 25 MCG (1000 UNIT) PO TABS
2000.0000 [IU] | ORAL_TABLET | Freq: Every day | ORAL | Status: DC
  Administered 2023-06-04 – 2023-06-05 (×2): 2000 [IU] via ORAL
  Filled 2023-06-03 (×4): qty 2

## 2023-06-03 MED ORDER — ATORVASTATIN CALCIUM 20 MG PO TABS
40.0000 mg | ORAL_TABLET | Freq: Every day | ORAL | Status: DC
  Administered 2023-06-04 – 2023-06-05 (×2): 40 mg via ORAL
  Filled 2023-06-03 (×2): qty 2

## 2023-06-03 MED ORDER — ACETAMINOPHEN 325 MG PO TABS: 650.0000 mg | ORAL_TABLET | Freq: Four times a day (QID) | ORAL | Status: DC | PRN

## 2023-06-03 MED ORDER — IPRATROPIUM-ALBUTEROL 0.5-2.5 (3) MG/3ML IN SOLN
3.0000 mL | Freq: Four times a day (QID) | RESPIRATORY_TRACT | Status: DC
  Administered 2023-06-03 – 2023-06-04 (×4): 3 mL via RESPIRATORY_TRACT
  Filled 2023-06-03 (×3): qty 3

## 2023-06-03 MED ORDER — TRAZODONE HCL 50 MG PO TABS: 25.0000 mg | ORAL_TABLET | Freq: Every evening | ORAL | Status: DC | PRN

## 2023-06-03 MED ORDER — ONDANSETRON HCL 4 MG PO TABS: 4.0000 mg | ORAL_TABLET | Freq: Four times a day (QID) | ORAL | Status: DC | PRN

## 2023-06-03 MED ORDER — INSULIN GLARGINE-YFGN 100 UNIT/ML ~~LOC~~ SOLN
18.0000 [IU] | Freq: Every day | SUBCUTANEOUS | Status: DC
  Administered 2023-06-03 – 2023-06-04 (×2): 18 [IU] via SUBCUTANEOUS
  Filled 2023-06-03 (×3): qty 0.18

## 2023-06-03 MED ORDER — HYDRALAZINE HCL 50 MG PO TABS
50.0000 mg | ORAL_TABLET | Freq: Two times a day (BID) | ORAL | Status: DC
  Administered 2023-06-03 – 2023-06-05 (×4): 50 mg via ORAL
  Filled 2023-06-03 (×4): qty 1

## 2023-06-03 MED ORDER — MAGNESIUM HYDROXIDE 400 MG/5ML PO SUSP: 30.0000 mL | Freq: Every day | ORAL | Status: DC | PRN

## 2023-06-03 MED ORDER — INSULIN ASPART 100 UNIT/ML IJ SOLN
0.0000 [IU] | INTRAMUSCULAR | Status: DC
  Administered 2023-06-03: 11 [IU] via SUBCUTANEOUS
  Administered 2023-06-03: 20 [IU] via SUBCUTANEOUS
  Administered 2023-06-04: 3 [IU] via SUBCUTANEOUS
  Filled 2023-06-03 (×3): qty 1

## 2023-06-03 MED ORDER — CLOPIDOGREL BISULFATE 75 MG PO TABS
75.0000 mg | ORAL_TABLET | Freq: Every morning | ORAL | Status: DC
  Administered 2023-06-04 – 2023-06-05 (×2): 75 mg via ORAL
  Filled 2023-06-03 (×2): qty 1

## 2023-06-03 MED ORDER — POTASSIUM CHLORIDE CRYS ER 10 MEQ PO TBCR
10.0000 meq | EXTENDED_RELEASE_TABLET | ORAL | Status: DC
  Administered 2023-06-04: 10 meq via ORAL
  Filled 2023-06-03 (×2): qty 1

## 2023-06-03 MED ORDER — ACETAMINOPHEN 650 MG RE SUPP: 650.0000 mg | Freq: Four times a day (QID) | RECTAL | Status: DC | PRN

## 2023-06-03 MED ORDER — GUAIFENESIN ER 600 MG PO TB12
600.0000 mg | ORAL_TABLET | Freq: Two times a day (BID) | ORAL | Status: DC
  Administered 2023-06-03 – 2023-06-05 (×4): 600 mg via ORAL
  Filled 2023-06-03 (×4): qty 1

## 2023-06-03 NOTE — Progress Notes (Signed)
 PHARMACIST - PHYSICIAN COMMUNICATION  CONCERNING:  Enoxaparin  (Lovenox ) for DVT Prophylaxis    RECOMMENDATION: Patient was prescribed enoxaprin 40mg  q24 hours for VTE prophylaxis.   Filed Weights   06/03/23 2210  Weight: 82.9 kg (182 lb 12.2 oz)    Body mass index is 34.53 kg/m.  Estimated Creatinine Clearance: 22.2 mL/min (A) (by C-G formula based on SCr of 1.94 mg/dL (H)).  Patient is candidate for enoxaparin  30mg  every 24 hours based on CrCl <35ml/min or Weight <45kg  DESCRIPTION: Pharmacy has adjusted enoxaparin  dose per [facility] policy.  Patient is now receiving enoxaparin  30 mg every 24 hours   Rank[place] CANDIE Dills, PharmD, [facility] 06/03/2023 10:11 PM

## 2023-06-03 NOTE — ED Notes (Signed)
 Dr. Achilles Holes aware that glucose is 418 which is above sliding scale. Instructed to cover with sliding scale at this time. Recheck in 1 our and cover again and then q4h after

## 2023-06-03 NOTE — ED Provider Notes (Signed)
 [facility] Provider Note    Event Date/Time   First MD Initiated Contact with Patient 06/03/23 1719     (approximate)   History   Chief Complaint Cough and Weakness   HPI  Kristen [name] is a 83 y.o. female with past medical history of hypertension, diabetes, CAD, DVT, stroke, seizure, and CKD who presents to the ED complaining of cough.  Patient reports that she has been feeling ill with a dry cough and some mild difficulty breathing over the past 48 hours.  She describes subjective fevers and chills, but has not taken her temperature at home.  She denies any pain in her chest, but has been getting out of breath with exertion, has not noticed any pain or swelling in her legs.  She has been feeling nauseous with decreased oral intake, but denies any vomiting or diarrhea.  She has not had any abdominal pain, dysuria, or flank pain.     Physical Exam   Triage Vital Signs: ED Triage Vitals  Encounter Vitals Group     BP 06/03/23 1714 116/70     Systolic BP Percentile --      Diastolic BP Percentile --      Pulse Rate 06/03/23 1714 69     Resp 06/03/23 1714 18     Temp 06/03/23 1714 98.3 F (36.8 C)     Temp Source 06/03/23 1714 Oral     SpO2 06/03/23 1714 99 %     Weight --      Height 06/03/23 1715 5' 1 (1.549 m)     Head Circumference --      Peak Flow --      Pain Score 06/03/23 1715 0     Pain Loc --      Pain Education --      Exclude from Growth Chart --     Most recent vital signs: Vitals:   06/03/23 1737 06/03/23 1737  BP:  (!) 157/68  Pulse:  60  Resp:  18  Temp:  99.3 F (37.4 C)  SpO2: 100% 100%    Constitutional: Alert and oriented. Eyes: Conjunctivae are normal. Head: Atraumatic. Nose: No congestion/rhinnorhea. Mouth/Throat: Mucous membranes are moist.  Cardiovascular: Normal rate, regular rhythm. Grossly normal heart sounds.  2+ radial pulses bilaterally. Respiratory: Normal respiratory effort.  No retractions. Lungs  CTAB. Gastrointestinal: Soft and nontender. No distention. Musculoskeletal: No lower extremity tenderness nor edema.  Neurologic:  Normal speech and language. No gross focal neurologic deficits are appreciated.    ED Results / Procedures / Treatments   Labs (all labs ordered are listed, but only abnormal results are displayed) Labs Reviewed  RESP PANEL BY RT-PCR (RSV, FLU A&B, COVID)  RVPGX2 - Abnormal; Notable for the following components:      Result Value   Influenza A by PCR POSITIVE (*)    Resp Syncytial Virus by PCR POSITIVE (*)    All other components within normal limits  CBC WITH DIFFERENTIAL/PLATELET - Abnormal; Notable for the following components:   Hemoglobin 10.8 (*)    HCT 33.8 (*)    All other components within normal limits  COMPREHENSIVE METABOLIC PANEL - Abnormal; Notable for the following components:   Sodium 130 (*)    Chloride 92 (*)    Glucose, Bld 511 (*)    BUN 45 (*)    Creatinine, Ser 1.94 (*)    Calcium  8.8 (*)    Albumin 3.2 (*)    Total Bilirubin  1.3 (*)    GFR, Estimated 26 (*)    All other components within normal limits  TROPONIN I (HIGH SENSITIVITY) - Abnormal; Notable for the following components:   Troponin I (High Sensitivity) 34 (*)    All other components within normal limits  TROPONIN I (HIGH SENSITIVITY)     EKG  ED ECG REPORT I, Carlin Palin, the attending physician, personally viewed and interpreted this ECG.   Date: 06/03/2023  EKG Time: 18:10  Rate: 60  Rhythm: normal sinus rhythm  Axis: LAD  Intervals:none  ST&T Change: LVH  RADIOLOGY Chest x-ray reviewed and interpreted by me with no infiltrate, edema, or effusion.  PROCEDURES:  Critical Care performed: No  Procedures   MEDICATIONS ORDERED IN ED: Medications  sodium chloride  0.9 % bolus 1,000 mL (has no administration in time range)  insulin  aspart (novoLOG ) injection 8 Units (has no administration in time range)     IMPRESSION / MDM / ASSESSMENT AND  PLAN / ED COURSE  I reviewed the triage vital signs and the nursing notes.                              83 y.o. female with past medical history of hypertension, diabetes, CAD, DVT, stroke, seizure, and CKD who presents to the ED complaining of cough, shortness of breath, weakness, malaise, and subjective fevers for the past 2 days.  Patient's presentation is most consistent with acute presentation with potential threat to life or bodily function.  Differential diagnosis includes, but is not limited to, COVID-19, influenza, pneumonia, ACS, PE, sepsis, anemia, electrolyte abnormality, AKI.  Patient nontoxic-appearing and in no acute distress, vital signs remarkable for mildly elevated temperature at 99.3 but otherwise reassuring.  She is not in any respiratory distress and maintaining oxygen saturations at 100% on room air.  Symptoms seem most consistent with viral syndrome, COVID and flu testing is pending at this time.  Given her shortness of breath, will also check EKG, labs including troponin, as well as chest x-ray.    Chest x-ray is unremarkable, patient did test positive for both influenza and RSV. Labs without significant anemia or leukocytosis, patient does have AKI and hyperglycemia, but no evidence of DKA.  We will hydrate with IV fluids and give dose of IV insulin .  Troponin mildly elevated but similar to prior baseline, doubt ACS or PE at this time.  Case discussed with hospitalist for admission.      FINAL CLINICAL IMPRESSION(S) / ED DIAGNOSES   Final diagnoses:  Generalized weakness  Influenza A  RSV (respiratory syncytial virus infection)  AKI (acute kidney injury) (HCC)  Hyperglycemia     Rx / DC Orders   ED Discharge Orders     None        Note:  This document was prepared using Dragon voice recognition software and may include unintentional dictation errors.   Palin Carlin, MD 06/03/23 [phone]

## 2023-06-03 NOTE — ED Triage Notes (Addendum)
 Pt c/o cough, congestion, lack of appetite, and increasing weakness x1 week and intermittent blurred vision and intermittent confusion x1 day.  Pt is currently A&Ox4.

## 2023-06-03 NOTE — H&P (Addendum)
 [place]   PATIENT NAME: Kristen [name]    MR#:  [id number]  DATE OF BIRTH:  1941/03/14  DATE OF ADMISSION:  06/03/2023  PRIMARY CARE PHYSICIAN: Rilla Baller, MD   Patient is coming from: Home  REQUESTING/REFERRING PHYSICIAN: Willo Dunnings, MD  CHIEF COMPLAINT:   Chief Complaint  Patient presents with   Cough   Weakness    HISTORY OF PRESENT ILLNESS:  Kristen [name] is a 83 y.o. African-American female with medical history significant for coronary artery disease, type 2 diabetes mellitus, GERD, gout, and dyslipidemia, who presented to the emergency room with acute onset of intermittent cough with chest congestion and expectoration of yellowish sputum as well as dyspnea and wheezing since last week.  She had nausea and vomiting yesterday.  She admitted to tactile fever and chills.  No chest pain or palpitations.  Glucose levels have been elevated.  She has been having diminished appetite and p.o. fluid intake.  ED Course: When the patient came to the ER, BP was 157/68 with temperature 99.3 with otherwise normal vital signs.  Labs revealed hyponatremia 130 and hypochloremia 92 with hyperglycemia of 511 and BUN of 45 with a creatinine 1.94 and calcium  8.8 with albumin of 3.2.  BUN and creatinine were 19 and 1.08 on 12/19/2022. EKG as reviewed by me : EKG showed normal sinus rhythm with rate of 60 with poor R wave progression and LVH with secondary repolarization abnormality.  It showed T wave inversion anterolaterally and inferiorly. Imaging: Two-view chest x-ray showed no acute cardiopulmonary disease and low lung volumes.  The patient was given 8 units of IV NovoLog  as well as 1 L bolus of IV normal saline.  She will be admitted to a progressive unit bed for further evaluation and management. PAST MEDICAL HISTORY:   Past Medical History:  Diagnosis Date   Abdominal aortic atherosclerosis (HCC) by CT 02/2014   Acute kidney injury (HCC) 02/29/2016   During  hospitalization 07/2017   CAD (coronary artery disease)    by CT, per pt h/o MI   Cataract    bilateral-removed   Clotting disorder (HCC)    h/o PE   Diabetes type 2, uncontrolled    Frequent headaches    GERD (gastroesophageal reflux disease)    Gout    H. pylori infection 2022   s/p treatment with Pylera  ([facility])   History of pulmonary embolism 2012   HLD (hyperlipidemia)    HTN (hypertension)    Internal capsule hemorrhage (HCC)    hx of sublacunar infarct involving the right posterior limb of the internal capsule    Morbid obesity (HCC)    Myocardial infarction (HCC) 2012   per pt. report, states she was treated with medicine, here at [facility])     Osteoarthritis    knees   Primary localized osteoarthritis of left knee 09/22/2014   Risk for falls 09/28/2017   Sleep apnea 2011   study done in [place], states that since she lost weight she doesn't use the CPAP any longer & she doesn't have a problem with sleep apnea   Stroke [facility])    still has balance problem on occas. , uses cane but that's mainly for the left knee pain   Syncope 03/01/2016   Thoracic aortic atherosclerosis (HCC) 12/2015   by CXR   Vertigo    hx. benign postitional postural    PAST SURGICAL HISTORY:   Past Surgical History:  Procedure Laterality Date   CATARACT EXTRACTION  Bilateral 04/25/2011   COLONOSCOPY  07/2020   done for positive iFOB - TA and TVA (large) consider rpt 3 yrs ([facility])   ESOPHAGOGASTRODUODENOSCOPY  07/2020   H pylori positive (treated), dilation of upper esoph stricture   EYE SURGERY     /w IOL   FOOT SURGERY Right    PARTIAL HYSTERECTOMY     for fibroids, ovaries remain   TONSILLECTOMY     TOTAL KNEE ARTHROPLASTY Right 1990s   TOTAL KNEE ARTHROPLASTY Left 09/22/2014   Fonda Olmsted, MD   TUBAL LIGATION      SOCIAL HISTORY:   Social History   Tobacco Use   Smoking status: Never   Smokeless tobacco: Never   Tobacco comments:    tobacco use- no   Substance Use  Topics   Alcohol use: Not Currently    FAMILY HISTORY:   Family History  Problem Relation Age of Onset   Cancer Mother        bone   Diabetes Father    Hypertension Father    Cancer Son 71       lung   Congenital heart disease Son 81   Stroke Brother    Colon cancer Neg Hx    Colon polyps Neg Hx    Esophageal cancer Neg Hx    Stomach cancer Neg Hx    Rectal cancer Neg Hx     DRUG ALLERGIES:   Allergies  Allergen Reactions   Bee Venom Anaphylaxis   Peanut-Containing Drug Products Itching   Aleve [Naproxen Sodium] Other (See Comments)    Spits up blood   Doxycycline  Other (See Comments)    Malaise, GI upset, felt drunk and very ill   Keflex  [Cephalexin ] Other (See Comments)    N&V & weakness and neck was swollen   Metformin  And Related Other (See Comments)    Chills, dizziness   Penicillins Rash    Has patient had a PCN reaction causing immediate rash, facial/tongue/throat swelling, SOB or lightheadedness with hypotension: Yes Has patient had a PCN reaction causing severe rash involving mucus membranes or skin necrosis: No Has patient had a PCN reaction that required hospitalization No Has patient had a PCN reaction occurring within the last 10 years: No If all of the above answers are NO, then may proceed with Cephalosporin use.     REVIEW OF SYSTEMS:   ROS As per history of present illness. All pertinent systems were reviewed above. Constitutional, HEENT, cardiovascular, respiratory, GI, GU, musculoskeletal, neuro, psychiatric, endocrine, integumentary and hematologic systems were reviewed and are otherwise negative/unremarkable except for positive findings mentioned above in the HPI.   MEDICATIONS AT HOME:   Prior to Admission medications   Medication Sig Start Date End Date Taking? Authorizing Provider  amLODipine  (NORVASC ) 5 MG tablet Take 1 tablet (5 mg total) by mouth daily. 06/07/22   Rilla Baller, MD  atorvastatin  (LIPITOR) 40 MG tablet Take 1  tablet (40 mg total) by mouth daily. 12/21/22   Rilla Baller, MD  Blood Glucose Monitoring Suppl [facility] VERIO) w/Device KIT Use to check blood sugar up to 4 times a day as directed 05/14/20   Rilla Baller, MD  Cholecalciferol  (VITAMIN D ) 2000 units CAPS Take 2,000 Units by mouth daily.    [provider]  clopidogrel  (PLAVIX ) 75 MG tablet Take 1 tablet (75 mg total) by mouth every morning. 12/21/22   Rilla Baller, MD  colchicine  0.6 MG tablet Take 1 tablet (0.6 mg total) by mouth daily as needed for  gout. 12/18/22   Rilla Baller, MD  cyclobenzaprine  (FLEXERIL ) 5 MG tablet Take 1 tablet (5 mg total) by mouth 2 (two) times daily as needed for muscle spasms (sedation precautions). 12/26/22   Rilla Baller, MD  cyclobenzaprine  (FLEXERIL ) 5 MG tablet Take 1 tablet (5 mg total) by mouth 3 (three) times daily as needed for up to 4 days. 11/14/22   Bradler, Evan K, MD  famotidine  (PEPCID ) 20 MG tablet Take 1 tablet (20 mg total) by mouth at bedtime. 07/04/22   Rilla Baller, MD  glucose blood [facility] VERIO) test strip USE TO TEST BLOOD SUGAR 4 TIMES DAILY AS DIRECTED 12/15/22   Rilla Baller, MD  hydrALAZINE  (APRESOLINE ) 50 MG tablet Take 1 tablet (50 mg total) by mouth at breakfast and at bedtime. 06/07/22   Rilla Baller, MD  hydrochlorothiazide  (HYDRODIURIL ) 12.5 MG tablet Take 1 tablet (12.5 mg total) by mouth in the morning. 06/07/22   Rilla Baller, MD  Insulin  Glargine (BASAGLAR  KWIKPEN) 100 UNIT/ML DIAL AND INJECT 18 UNITS UNDER THE SKIN AT BEDTIME 05/15/23   Rilla Baller, MD  Insulin  Pen Needle (COMFORT EZ PEN NEEDLES) 31G X 5 MM MISC Use as instructed to inject insulin  daily 12/21/22   Rilla Baller, MD  Insulin  Pen Needle (COMFORT EZ PEN NEEDLES) 31G X 5 MM MISC Use as directed to inject insulin  daily 12/21/22   Rilla Baller, MD  Lancets [facility] DELICA PLUS [place]) MISC Use as instructed to check blood sugar once a day 12/21/22   Rilla Baller, MD  losartan  (COZAAR ) 100 MG tablet Take 1 tablet (100 mg total) by mouth every morning. 12/21/22   Rilla Baller, MD  metoprolol  succinate (TOPROL -XL) 25 MG 24 hr tablet Take 0.5 tablets (12.5 mg total) by mouth daily. 12/21/22   Rilla Baller, MD  Miconazole  2 % POWD Apply 1 Application topically 2 (two) times daily as needed (fungal skin infection). 12/19/22   Rilla Baller, MD  nitroGLYCERIN  (NITROSTAT ) 0.4 MG SL tablet Place 0.4 mg under the tongue every 5 (five) minutes as needed for chest pain.    [provider]  OneTouch Delica Lancets 33G MISC Use as directed to test blood sugar once daily. 05/24/22   Rilla Baller, MD  potassium chloride  (KLOR-CON ) 10 MEQ tablet Take 1 tablet (10 mEq total) by mouth every Monday, Wednesday, and Friday. 12/22/22   Rilla Baller, MD  tiZANidine  (ZANAFLEX ) 4 MG tablet Take 1 tablet (4 mg total) by mouth every 8 (eight) hours as needed for muscle spasms. 12/20/22   Rilla Baller, MD      VITAL SIGNS:  Blood pressure (!) 192/84, pulse (!) 56, temperature 98.6 F (37 C), temperature source Axillary, resp. rate 15, height 5' 1 (1.549 m), weight 82.9 kg, SpO2 98%.  PHYSICAL EXAMINATION:  Physical Exam  GENERAL:  83 y.o.-year-old African-American female patient lying in the bed with no acute distress.  EYES: Pupils equal, round, reactive to light and accommodation. No scleral icterus. Extraocular muscles intact.  HEENT: Head atraumatic, normocephalic. Oropharynx and nasopharynx clear.  NECK:  Supple, no jugular venous distention. No thyroid  enlargement, no tenderness.  LUNGS: Diminished bibasal breath sounds with no wheezing, rales,rhonchi or crepitation. No use of accessory muscles of respiration.  CARDIOVASCULAR: Regular rate and rhythm, S1, S2 normal. No murmurs, rubs, or gallops.  ABDOMEN: Soft, nondistended, nontender. Bowel sounds present. No organomegaly or mass.  EXTREMITIES: No pedal edema, cyanosis, or  clubbing.  NEUROLOGIC: Cranial nerves II through XII are intact. Muscle strength 5/5 in all  extremities. Sensation intact. Gait not checked.  PSYCHIATRIC: The patient is alert and oriented x 3.  Normal affect and good eye contact. SKIN: No obvious rash, lesion, or ulcer.   LABORATORY PANEL:   CBC Recent Labs  Lab 06/03/23 1743  WBC 6.1  HGB 10.8*  HCT 33.8*  PLT 163   ------------------------------------------------------------------------------------------------------------------  Chemistries  Recent Labs  Lab 06/03/23 1743  NA 130*  K 4.0  CL 92*  CO2 24  GLUCOSE 511*  BUN 45*  CREATININE 1.94*  CALCIUM  8.8*  AST 29  ALT 18  ALKPHOS 74  BILITOT 1.3*   ------------------------------------------------------------------------------------------------------------------  Cardiac Enzymes No results for input(s): TROPONINI in the last 168 hours. ------------------------------------------------------------------------------------------------------------------  RADIOLOGY:  DG Chest 2 View Result Date: 06/03/2023 CLINICAL DATA:  Cough and shortness of breath. Decreased appetite. Weakness. EXAM: CHEST - 2 VIEW COMPARISON:  One-view chest x-ray 11/14/2022. FINDINGS: The heart size normal. Atherosclerotic calcifications are present at the aortic arch. No edema or effusion is present. No focal airspace disease is present. Lung volumes are low. IMPRESSION: 1. Low lung volumes. 2. No acute cardiopulmonary disease. Electronically Signed   By: Lonni Necessary M.D.   On: 06/03/2023 18:16      IMPRESSION AND PLAN:  Assessment and Plan: * AKI (acute kidney injury) (HCC) - This is likely prerenal due to volume depletion and dehydration. - The patient will be admitted to a progressive unit. - We will continue hydration with IV normal saline. - We will avoid nephrotoxins. - We will follow BMPs.  Uncontrolled type 2 diabetes mellitus with hyperglycemia (HCC) - The patient will  be placed on supplemental coverage with NovoLog  with frequent fingerstick blood glucose measures. - We will continue basal coverage.  Influenza A - This is associated with RSV infection. - Supportive management will be provided. - Given persistent cough for the last week with persistent symptoms and expectoration of purulent sputum, we will go ahead and add IV Levaquinor for potential associated bronchitis. - Bronchodilator therapy and mucolytic therapy will be provided. - The patient will be hydrated with IV normal saline.  Essential hypertension - We will continue antihypertensive therapy.  Dyslipidemia - We will continue statin therapy.  Coronary artery disease - We will continue statin therapy as well as beta-blocker therapy and ARB. - We will continue as needed sublingual nitroglycerin .   DVT prophylaxis: Lovenox . Advanced Care Planning:  Code Status: The patient is DNR only. Family Communication:  The plan of care was discussed in details with the patient (and family). I answered all questions. The patient agreed to proceed with the above mentioned plan. Further management will depend upon hospital course. Disposition Plan: Back to previous home environment Consults called: none. All the records are reviewed and case discussed with ED provider.  Status is: Inpatient  At the time of the admission, it appears that the appropriate admission status for this patient is inpatient.  This is judged to be reasonable and necessary in order to provide the required intensity of service to ensure the patient's safety given the presenting symptoms, physical exam findings and initial radiographic and laboratory data in the context of comorbid conditions.  The patient requires inpatient status due to high intensity of service, high risk of further deterioration and high frequency of surveillance required.  I certify that at the time of admission, it is my clinical judgment that the patient  will require inpatient hospital care extending more than 2 midnights.  Dispo: The patient is from: Home              Anticipated d/c is to: Home              Patient currently is not medically stable to d/c.              Difficult to place patient: No  [name] DELENA Peaches M.D on 06/04/2023 at 2:25 AM  [facility]   From 7 PM-7 AM, contact night-coverage [email or web address]  CC: Primary care physician; Rilla Baller, MD

## 2023-06-04 ENCOUNTER — Inpatient Hospital Stay: Payer: PPO

## 2023-06-04 DIAGNOSIS — J101 Influenza due to other identified influenza virus with other respiratory manifestations: Secondary | ICD-10-CM

## 2023-06-04 DIAGNOSIS — J40 Bronchitis, not specified as acute or chronic: Secondary | ICD-10-CM

## 2023-06-04 DIAGNOSIS — Z6834 Body mass index (BMI) 34.0-34.9, adult: Secondary | ICD-10-CM

## 2023-06-04 DIAGNOSIS — E1165 Type 2 diabetes mellitus with hyperglycemia: Secondary | ICD-10-CM

## 2023-06-04 DIAGNOSIS — I2510 Atherosclerotic heart disease of native coronary artery without angina pectoris: Secondary | ICD-10-CM | POA: Insufficient documentation

## 2023-06-04 DIAGNOSIS — I10 Essential (primary) hypertension: Secondary | ICD-10-CM | POA: Diagnosis not present

## 2023-06-04 DIAGNOSIS — E6609 Other obesity due to excess calories: Secondary | ICD-10-CM

## 2023-06-04 DIAGNOSIS — E785 Hyperlipidemia, unspecified: Secondary | ICD-10-CM

## 2023-06-04 DIAGNOSIS — N179 Acute kidney failure, unspecified: Secondary | ICD-10-CM | POA: Diagnosis not present

## 2023-06-04 DIAGNOSIS — E66811 Obesity, class 1: Secondary | ICD-10-CM

## 2023-06-04 HISTORY — DX: Bronchitis, not specified as acute or chronic: J40

## 2023-06-04 LAB — GLUCOSE, CAPILLARY
Glucose-Capillary: 208 mg/dL — ABNORMAL HIGH (ref 70–99)
Glucose-Capillary: 255 mg/dL — ABNORMAL HIGH (ref 70–99)

## 2023-06-04 LAB — CBC
HCT: 33.7 % — ABNORMAL LOW (ref 36.0–46.0)
Hemoglobin: 10.9 g/dL — ABNORMAL LOW (ref 12.0–15.0)
MCH: 27.5 pg (ref 26.0–34.0)
MCHC: 32.3 g/dL (ref 30.0–36.0)
MCV: 84.9 fL (ref 80.0–100.0)
Platelets: 169 10*3/uL (ref 150–400)
RBC: 3.97 MIL/uL (ref 3.87–5.11)
RDW: 14.2 % (ref 11.5–15.5)
WBC: 5.6 10*3/uL (ref 4.0–10.5)
nRBC: 0 % (ref 0.0–0.2)

## 2023-06-04 LAB — CBG MONITORING, ED
Glucose-Capillary: 143 mg/dL — ABNORMAL HIGH (ref 70–99)
Glucose-Capillary: 70 mg/dL (ref 70–99)
Glucose-Capillary: 97 mg/dL (ref 70–99)
Glucose-Capillary: 230 mg/dL — ABNORMAL HIGH (ref 70–99)

## 2023-06-04 LAB — BASIC METABOLIC PANEL
Anion gap: 11 (ref 5–15)
BUN: 32 mg/dL — ABNORMAL HIGH (ref 8–23)
CO2: 25 mmol/L (ref 22–32)
Calcium: 8.4 mg/dL — ABNORMAL LOW (ref 8.9–10.3)
Chloride: 100 mmol/L (ref 98–111)
Creatinine, Ser: 1.12 mg/dL — ABNORMAL HIGH (ref 0.44–1.00)
GFR, Estimated: 49 mL/min — ABNORMAL LOW (ref >60)
Glucose, Bld: 180 mg/dL — ABNORMAL HIGH (ref 70–99)
Potassium: 3.4 mmol/L — ABNORMAL LOW (ref 3.5–5.1)
Sodium: 136 mmol/L (ref 135–145)

## 2023-06-04 MED ORDER — LEVOFLOXACIN IN D5W 250 MG/50ML IV SOLN
250.0000 mg | INTRAVENOUS | Status: DC
  Administered 2023-06-05: 250 mg via INTRAVENOUS
  Filled 2023-06-04: qty 50

## 2023-06-04 MED ORDER — LEVOFLOXACIN IN D5W 500 MG/100ML IV SOLN
500.0000 mg | INTRAVENOUS | Status: DC
Start: 2023-06-04 — End: 2023-06-04

## 2023-06-04 MED ORDER — LEVOFLOXACIN IN D5W 500 MG/100ML IV SOLN
500.0000 mg | Freq: Once | INTRAVENOUS | Status: AC
  Administered 2023-06-04: 500 mg via INTRAVENOUS
  Filled 2023-06-04: qty 100

## 2023-06-04 MED ORDER — IPRATROPIUM-ALBUTEROL 0.5-2.5 (3) MG/3ML IN SOLN: 3.0000 mL | RESPIRATORY_TRACT | Status: DC | PRN

## 2023-06-04 MED ORDER — INSULIN ASPART 100 UNIT/ML IJ SOLN
0.0000 [IU] | Freq: Every day | INTRAMUSCULAR | Status: DC
  Administered 2023-06-04: 3 [IU] via SUBCUTANEOUS
  Filled 2023-06-04: qty 1

## 2023-06-04 MED ORDER — HYDRALAZINE HCL 20 MG/ML IJ SOLN
10.0000 mg | Freq: Four times a day (QID) | INTRAMUSCULAR | Status: DC | PRN
  Administered 2023-06-04: 10 mg via INTRAVENOUS
  Filled 2023-06-04: qty 1

## 2023-06-04 MED ORDER — ENSURE ENLIVE PO LIQD
237.0000 mL | Freq: Two times a day (BID) | ORAL | Status: DC
  Administered 2023-06-05: 237 mL via ORAL

## 2023-06-04 MED ORDER — INSULIN ASPART 100 UNIT/ML IJ SOLN
0.0000 [IU] | Freq: Three times a day (TID) | INTRAMUSCULAR | Status: DC
  Administered 2023-06-04 – 2023-06-05 (×2): 3 [IU] via SUBCUTANEOUS
  Administered 2023-06-05: 2 [IU] via SUBCUTANEOUS
  Filled 2023-06-04 (×3): qty 1

## 2023-06-04 MED ORDER — AMLODIPINE BESYLATE 10 MG PO TABS
10.0000 mg | ORAL_TABLET | Freq: Every day | ORAL | Status: DC
  Administered 2023-06-05: 10 mg via ORAL
  Filled 2023-06-04: qty 1

## 2023-06-04 NOTE — Assessment & Plan Note (Signed)
-   We will continue antihypertensive therapy.

## 2023-06-04 NOTE — ED Notes (Signed)
 Awaiting verification from pharmacy for Levaquin 

## 2023-06-04 NOTE — Assessment & Plan Note (Signed)
-   The patient will be placed on supplemental coverage with NovoLog with frequent fingerstick blood glucose measures. - We will continue basal coverage.

## 2023-06-04 NOTE — Assessment & Plan Note (Signed)
 -  We will continue statin therapy.

## 2023-06-04 NOTE — Assessment & Plan Note (Addendum)
-   This is associated with RSV infection. - Supportive management will be provided. - Given persistent cough for the last week with persistent symptoms and expectoration of purulent sputum, we will go ahead and add IV Levaquinor for potential associated bronchitis. - Bronchodilator therapy and mucolytic therapy will be provided. - The patient will be hydrated with IV normal saline.

## 2023-06-04 NOTE — Assessment & Plan Note (Signed)
-   We will continue statin therapy as well as beta-blocker therapy and ARB. - We will continue as needed sublingual nitroglycerin .

## 2023-06-04 NOTE — Assessment & Plan Note (Signed)
-   This is likely prerenal due to volume depletion and dehydration. - The patient will be admitted to a progressive unit. - We will continue hydration with IV normal saline. - We will avoid nephrotoxins. - We will follow BMPs.

## 2023-06-04 NOTE — ED Notes (Signed)
Informed RN bed assigned 

## 2023-06-04 NOTE — Inpatient Diabetes Management (Signed)
 Inpatient Diabetes Program Recommendations  AACE/ADA: New Consensus Statement on Inpatient Glycemic Control (2015)  Target Ranges:  Prepandial:   less than 140 mg/dL      Peak postprandial:   less than 180 mg/dL (1-2 hours)      Critically ill patients:  140 - 180 mg/dL   Lab Results  Component Value Date   GLUCAP 70 06/04/2023   HGBA1C 9.2 (A) 03/26/2023    Latest Reference Range & Units 06/03/23 20:12 06/03/23 21:31 06/03/23 23:42 06/04/23 03:46 06/04/23 07:24  Glucose-Capillary 70 - 99 mg/dL 981 (HH) Novolog  8 units 418 (H) Novolog  20 units 289 (H) Novolog  11 units  143 (H) Novolog  3 units 70  (HH): Data is critically high (H): Data is abnormally high  Diabetes history: DM2 Outpatient Diabetes medications: Basaglar  18 units daily Current orders for Inpatient glycemic control: Semglee  18 units daily Novolog  0-20 units q 4 hrs.  Inpatient Diabetes Program Recommendations:   Noted CBG this am 70 and patient now eating. Please consider: -Decrease Novolog  correction to 0-9 units tid, 0-5 units hs  Thank you, Haroldine Redler E. Anjalina Bergevin, RN, MSN, CDCES  Diabetes Coordinator Inpatient Glycemic Control Team Team Pager [phone] (8am-5pm) 06/04/2023 11:32 AM

## 2023-06-04 NOTE — Progress Notes (Signed)
 Progress Note   Patient: Kristen [name]:[id number] DOB: 03-31-1941 DOA: 06/03/2023     1 DOS: the patient was seen and examined on 06/04/2023   Brief hospital course: Kristen [name] is a 83 y.o. African-American female with medical history significant for coronary artery disease, type 2 diabetes mellitus, GERD, gout, and dyslipidemia, who presented to the emergency room with acute onset of intermittent cough with chest congestion and expectoration of yellowish sputum as well as dyspnea and wheezing since last week.   Patient is admitted to the hospitalist service for further management evaluation of hyperglycemia, flu and RSV infection  Assessment and Plan: * AKI (acute kidney injury) (HCC) Kidney function improving with fluids. Will decrease IV fluid rate to 75 mL/h. Avoid nephrotoxins.  Hydrochlorothiazide  on hold. Monitor renal function.  Uncontrolled type 2 diabetes mellitus with hyperglycemia (HCC) Her blood sugars improved. Patient is able to tolerate diet. Continue Accu-Cheks ACHS. Continue Semglee  18 units with sliding scale insulin .  Influenza A Associated with RSV infection. Continue IV Levaquinor for potential associated bronchitis. Continue bronchodilator therapy and mucolytic therapy will be provided. Gentle IV hydration.  Essential hypertension Patient's BP elevated. Continue Toprol  12.5 oral daily, Norvasc  dose increased to 10 mg daily. Hydralazine  50 bid home dose ordered. Hold hydrochlorothiazide . IV hydralazine  as needed for elevated blood pressures ordered.  Hypokalemia Oral potassium supplementation ordered. Continue to monitor daily lites.  Coronary artery disease Hyperlipidemia Patient denies chest pain at this time.  Troponin remain flat. Continue statin therapy as well as beta-blocker therapy and ARB.  Obesity class I BMI 34.53. Diet, exercise and weight reduction advised.       Out of bed to chair. Incentive spirometry. Nursing  supportive care. Fall, aspiration precautions. DVT prophylaxis   Code Status: Full Code Subjective: Patient is seen and examined today morning in the emergency department.  She is able to answer me appropriately, able to tolerate diet.  Her blood sugars improved.  She is on room air.  Physical Exam: Vitals:   06/04/23 0600 06/04/23 0700 06/04/23 0900 06/04/23 1000  BP: (!) 143/66 (!) 149/65 (!) 149/65 (!) 172/69  Pulse: (!) 51 (!) 53 (!) 55 (!) 58  Resp: 13 13 13 15   Temp:      TempSrc:      SpO2: 95% 97% 97% 100%  Weight:      Height:        General - Elderly obese African-American female, lying, no apparent distress HEENT - PERRLA, EOMI, atraumatic head, non tender sinuses. Lung - Clear, basal rhonchi, diffuse wheezes. Heart - S1, S2 heard, no murmurs, rubs, trace pedal edema. Abdomen - Soft, non tender, obese, bowel sounds good Neuro - Alert, awake and oriented x 3, non focal exam. Skin - Warm and dry.  Data Reviewed:      Latest Ref Rng & Units 06/04/2023    3:57 AM 06/03/2023    5:43 PM 12/19/2022   10:37 AM  CBC  WBC 4.0 - 10.5 K/uL 5.6  6.1  6.6   Hemoglobin 12.0 - 15.0 g/dL 81.1  91.4  78.2   Hematocrit 36.0 - 46.0 % 33.7  33.8  35.8   Platelets 150 - 400 K/uL 169  163  257.0       Latest Ref Rng & Units 06/04/2023    3:57 AM 06/03/2023    5:43 PM 12/19/2022   10:37 AM  BMP  Glucose 70 - 99 mg/dL 956  213  086   BUN 8 -  23 mg/dL 32  45  19   Creatinine 0.44 - 1.00 mg/dL 1.61  0.96  0.45   Sodium 135 - 145 mmol/L 136  130  142   Potassium 3.5 - 5.1 mmol/L 3.4  4.0  4.0   Chloride 98 - 111 mmol/L 100  92  102   CO2 22 - 32 mmol/L 25  24  31    Calcium  8.9 - 10.3 mg/dL 8.4  8.8  9.8    US  Venous Img Lower Unilateral Left Result Date: 06/04/2023 CLINICAL DATA:  Left lower extremity pain EXAM: LEFT LOWER EXTREMITY VENOUS DOPPLER ULTRASOUND TECHNIQUE: Gray-scale sonography with compression, as well as color and duplex ultrasound, were performed to evaluate the deep  venous system(s) from the level of the common femoral vein through the popliteal and proximal calf veins. COMPARISON:  None Available. FINDINGS: VENOUS Normal compressibility of the common femoral, superficial femoral, and popliteal veins, as well as the visualized calf veins. Visualized portions of profunda femoral vein and great saphenous vein unremarkable. No filling defects to suggest DVT on grayscale or color Doppler imaging. Doppler waveforms show normal direction of venous flow, normal respiratory plasticity and response to augmentation. Limited views of the contralateral common femoral vein are unremarkable. OTHER None. Limitations: none IMPRESSION: Negative. Electronically Signed   By: Fernando Hoyer M.D.   On: 06/04/2023 06:40   DG Chest 2 View Result Date: 06/03/2023 CLINICAL DATA:  Cough and shortness of breath. Decreased appetite. Weakness. EXAM: CHEST - 2 VIEW COMPARISON:  One-view chest x-ray 11/14/2022. FINDINGS: The heart size normal. Atherosclerotic calcifications are present at the aortic arch. No edema or effusion is present. No focal airspace disease is present. Lung volumes are low. IMPRESSION: 1. Low lung volumes. 2. No acute cardiopulmonary disease. Electronically Signed   By: Audree Leas M.D.   On: 06/03/2023 18:16   Family Communication: Discussed with patient, she understand and agree. All questions answereed.  Disposition: Status is: Inpatient Remains inpatient appropriate because: Hyperglycemia, flu, RSV, AKI  Planned Discharge Destination: Home with Home Health     Time spent: 38 minutes  Author: Aisha Hove, MD 06/04/2023 10:49 AM Secure chat 7am to 7pm For on call review [email or web address].

## 2023-06-05 ENCOUNTER — Other Ambulatory Visit: Payer: Self-pay

## 2023-06-05 DIAGNOSIS — J101 Influenza due to other identified influenza virus with other respiratory manifestations: Secondary | ICD-10-CM | POA: Diagnosis not present

## 2023-06-05 DIAGNOSIS — I10 Essential (primary) hypertension: Secondary | ICD-10-CM | POA: Diagnosis not present

## 2023-06-05 DIAGNOSIS — N179 Acute kidney failure, unspecified: Secondary | ICD-10-CM | POA: Diagnosis not present

## 2023-06-05 DIAGNOSIS — E1165 Type 2 diabetes mellitus with hyperglycemia: Secondary | ICD-10-CM | POA: Diagnosis not present

## 2023-06-05 LAB — GLUCOSE, CAPILLARY
Glucose-Capillary: 191 mg/dL — ABNORMAL HIGH (ref 70–99)
Glucose-Capillary: 223 mg/dL — ABNORMAL HIGH (ref 70–99)

## 2023-06-05 MED ORDER — ATORVASTATIN CALCIUM 40 MG PO TABS
40.0000 mg | ORAL_TABLET | Freq: Every day | ORAL | 1 refills | Status: DC
  Filled 2023-06-05 – 2023-06-19 (×5): qty 30, 30d supply, fill #0
  Filled 2023-06-28 – 2023-07-12 (×3): qty 30, 30d supply, fill #1
  Filled ????-??-??: fill #1

## 2023-06-05 MED ORDER — LEVOFLOXACIN 750 MG PO TABS
750.0000 mg | ORAL_TABLET | Freq: Every day | ORAL | 0 refills | Status: AC
  Filled 2023-06-05: qty 5, 5d supply, fill #0

## 2023-06-05 MED ORDER — POTASSIUM CHLORIDE ER 10 MEQ PO TBCR
10.0000 meq | EXTENDED_RELEASE_TABLET | ORAL | 0 refills | Status: DC
  Filled 2023-06-05 – 2023-06-13 (×5): qty 30, 70d supply, fill #0

## 2023-06-05 MED ORDER — LOSARTAN POTASSIUM 100 MG PO TABS
100.0000 mg | ORAL_TABLET | Freq: Every morning | ORAL | 1 refills | Status: DC
  Filled 2023-06-05 – 2023-06-19 (×4): qty 30, 30d supply, fill #0
  Filled 2023-06-28: qty 30, 30d supply, fill #1
  Filled ????-??-??: fill #1

## 2023-06-05 MED ORDER — AMLODIPINE BESYLATE 10 MG PO TABS
10.0000 mg | ORAL_TABLET | Freq: Every day | ORAL | 1 refills | Status: DC
  Filled 2023-06-05: qty 30, 30d supply, fill #0
  Filled 2023-06-06 – 2023-06-26 (×2): qty 30, 30d supply, fill #1
  Filled 2023-06-28 (×2): qty 14, 14d supply, fill #1

## 2023-06-05 MED ORDER — GUAIFENESIN ER 600 MG PO TB12
600.0000 mg | ORAL_TABLET | Freq: Two times a day (BID) | ORAL | 0 refills | Status: AC
  Filled 2023-06-05: qty 40, 20d supply, fill #0

## 2023-06-05 MED ORDER — METOPROLOL SUCCINATE ER 25 MG PO TB24
12.5000 mg | ORAL_TABLET | Freq: Every day | ORAL | 1 refills | Status: DC
  Filled 2023-06-05 – 2023-06-13 (×5): qty 15, 30d supply, fill #0
  Filled 2023-06-28: qty 15, 30d supply, fill #1
  Filled ????-??-??: fill #1

## 2023-06-05 MED ORDER — CLOPIDOGREL BISULFATE 75 MG PO TABS
75.0000 mg | ORAL_TABLET | Freq: Every morning | ORAL | 1 refills | Status: DC
  Filled 2023-06-05 – 2023-06-19 (×4): qty 30, 30d supply, fill #0
  Filled 2023-06-28 – 2023-07-12 (×3): qty 30, 30d supply, fill #1
  Filled ????-??-??: fill #1

## 2023-06-05 MED ORDER — CYCLOBENZAPRINE HCL 5 MG PO TABS
5.0000 mg | ORAL_TABLET | Freq: Two times a day (BID) | ORAL | 0 refills | Status: AC | PRN
  Filled 2023-06-05: qty 30, 15d supply, fill #0

## 2023-06-05 NOTE — Evaluation (Signed)
Physical Therapy Evaluation Patient Details Name: Kristen [name] MRN: [id number] DOB: 1941-03-04 Today's Date: 06/05/2023  History of Present Illness  Pt is an 83 y.o. female presenting to hospital 06/03/23 with c/o cough, SOB, weakness, malaise, and subjective fevers for past 2 days.  Pt admitted with AKI, uncontrolled type 2 DM with hyperglycemia, influenza A, RSV infection, hypokalemia.  PMH includes htn, DM, CAD, DVT, stroke, seizure, CKD, h/o PE, MI, sleep apnea, vertigo, B TKA.  Clinical Impression  Prior to recent medical concerns, pt was independent with ambulation; lives alone.  Currently pt is modified independent with bed mobility, independent with transfer, and independent with ambulation in hallway.  SpO2 sats 96% or greater on room air during sessions activities (mild SOB noted with activity).  Increased B lateral sway noted during ambulation but no loss of balance noted.  Pt reports feeling a lot better and that she was back to baseline level of functional mobility. No acute PT needs identified; will sign off.  Pt planning to discharge home today.    If plan is discharge home, recommend the following: Assist for transportation   Can travel by private vehicle    Yes    Equipment Recommendations None recommended by PT  Recommendations for Other Services       Functional Status Assessment Patient has not had a recent decline in their functional status     Precautions / Restrictions Precautions Precautions: Fall Restrictions Weight Bearing Restrictions Per Provider Order: No      Mobility  Bed Mobility Overal bed mobility: Modified Independent             General bed mobility comments: No difficulties noted    Transfers Overall transfer level: Independent Equipment used: None               General transfer comment: steady transfer from bed    Ambulation/Gait Ambulation/Gait assistance: Independent Gait Distance (Feet): 200 Feet Assistive device:  None Gait Pattern/deviations: Step-through pattern Gait velocity: mildly decreased     General Gait Details: mild increased B lateral sway but no loss of balance noted  Stairs            Wheelchair Mobility     Tilt Bed    Modified Rankin (Stroke Patients Only)       Balance Overall balance assessment: No apparent balance deficits (not formally assessed) (No loss of balance noted during sessions activities)                                           Pertinent Vitals/Pain Pain Assessment Pain Assessment: No/denies pain HR stable during session.    Home Living Family/patient expects to be discharged to:: Private residence Living Arrangements: Alone Available Help at Discharge: Family;Available PRN/intermittently Type of Home: House Home Access: Stairs to enter Entrance Stairs-Rails: Right Entrance Stairs-Number of Steps: 2 at the back with R railing   Home Layout: One level Home Equipment: Agricultural consultant (2 wheels);Shower seat;Grab bars - tub/shower;Hand held shower head;Grab bars - toilet;Cane - single point Additional Comments: only uses the shower seat and grab bars    Prior Function Prior Level of Function : Independent/Modified Independent;Driving             Mobility Comments: Independent with ambulation; (+) drives; denies any recent falls ADLs Comments: Independent with ADL/IADLs; does her own grocery shopping     Extremity/Trunk Assessment  Upper Extremity Assessment Upper Extremity Assessment: Overall WFL for tasks assessed    Lower Extremity Assessment Lower Extremity Assessment: Overall WFL for tasks assessed    Cervical / Trunk Assessment Cervical / Trunk Assessment: Normal  Communication   Communication Communication: No apparent difficulties    Cognition Arousal: Alert Behavior During Therapy: WFL for tasks assessed/performed   PT - Cognitive impairments: No apparent impairments                          Following commands: Intact       Cueing Cueing Techniques: Verbal cues     General Comments General comments (skin integrity, edema, etc.): Pt reporting feeling a lot better today    Exercises  Nursing cleared pt for participation in physical therapy.  Pt agreeable to PT session.   Assessment/Plan    PT Assessment Patient does not need any further PT services  PT Problem List         PT Treatment Interventions      PT Goals (Current goals can be found in the Care Plan section)  Acute Rehab PT Goals Patient Stated Goal: to go home today PT Goal Formulation: With patient Time For Goal Achievement: 06/19/23 Potential to Achieve Goals: Good    Frequency       Co-evaluation               AM-PAC PT "6 Clicks" Mobility  Outcome Measure Help needed turning from your back to your side while in a flat bed without using bedrails?: None Help needed moving from lying on your back to sitting on the side of a flat bed without using bedrails?: None Help needed moving to and from a bed to a chair (including a wheelchair)?: None Help needed standing up from a chair using your arms (e.g., wheelchair or bedside chair)?: None Help needed to walk in hospital room?: None Help needed climbing 3-5 steps with a railing? : None 6 Click Score: 24    End of Session Equipment Utilized During Treatment: Gait belt Activity Tolerance: Patient tolerated treatment well Patient left: in bed;with call bell/phone within reach Nurse Communication: Mobility status;Other (comment) (Nurse cleared pt to not have bed alarm on) PT Visit Diagnosis: Muscle weakness (generalized) (M62.81)    Time: 1610-9604 PT Time Calculation (min) (ACUTE ONLY): 11 min   Charges:   PT Evaluation $PT Eval Low Complexity: 1 Low   PT General Charges $$ ACUTE PT VISIT: 1 Visit        Hendricks Limes, PT 06/05/23, 12:49 PM

## 2023-06-05 NOTE — TOC CM/SW Note (Signed)
Transition of Care [facility]) - Inpatient Brief Assessment   Patient Details  Name: Kristen [name] MRN: [id number] Date of Birth: 03-16-1941  Transition of Care [facility]) CM/SW Contact:    Margarito Liner, LCSW Phone Number: 06/05/2023, 10:33 AM   Clinical Narrative: CSW reviewed chart. No TOC needs identified so far. CSW will continue to follow progress. Please place [facility] consult if any needs arise.  Transition of Care Asessment: Insurance and Status: Insurance coverage has been reviewed Patient has primary care physician: Yes Home environment has been reviewed: Single family home Prior level of function:: Not documented Prior/Current Home Services: No current home services Social Drivers of Health Review: SDOH reviewed no interventions necessary Readmission risk has been reviewed: Yes Transition of care needs: no transition of care needs at this time

## 2023-06-05 NOTE — Discharge Summary (Signed)
Physician Discharge Summary   Patient: Kristen [name] MRN: [id number] DOB: 07/11/1940  Admit date:     06/03/2023  Discharge date: 06/05/23  Discharge Physician: Marcelino Duster   PCP: Eustaquio Boyden, MD   Recommendations at discharge:   PCP follow up in 1 week. Repeat BMP in 1 week.  Discharge Diagnoses: Principal Problem:   AKI (acute kidney injury) (HCC) Active Problems:   Uncontrolled type 2 diabetes mellitus with hyperglycemia (HCC)   Essential hypertension   Influenza A   Dyslipidemia   Coronary artery disease  Resolved Problems:   * No resolved hospital problems. *  Hospital Course: Kristen [name] is a 83 y.o. African-American female with medical history significant for coronary artery disease, type 2 diabetes mellitus, GERD, gout, and dyslipidemia, who presented to the emergency room with acute onset of intermittent cough with chest congestion and expectoration of yellowish sputum as well as dyspnea and wheezing since last week.    Patient is admitted to the hospitalist service for further management evaluation of hyperglycemia, flu and RSV infection  Assessment and Plan: * AKI (acute kidney injury) (HCC) Kidney function improved with fluids. Avoid nephrotoxins.  Hydrochlorothiazide stopped. Monitor renal function as outpatient.   Uncontrolled type 2 diabetes mellitus with hyperglycemia (HCC) Uncontrolled sugars due to influenza infection. Her blood sugars improved.  Insulin drip changed to sq.  Resumed long acting insulin 18 units.   Influenza A/ RSV infection. Continue IV Levaquin changed to oral for 5 days for potential associated bronchitis. Continue bronchodilator therapy and mucolytic therapy. PT/ OT evaluated her and she did well.   Essential hypertension Patient's BP elevated. Continue Toprol 12.5 oral daily, Norvasc dose increased to 10 mg daily, Losartan 100mg , Hydralazine 50 bid home dose. Stopped hydrochlorothiazide.    Hypokalemia Oral potassium supplementation ordered. Continue to monitor daily lites.   Coronary artery disease Hyperlipidemia Patient denies chest pain at this time.  Troponin remain flat. Continue statin therapy as well as beta-blocker therapy and ARB.   Obesity class I BMI 34.53. Diet, exercise and weight reduction advised.       Consultants: none. Procedures performed: none  Disposition: Home Diet recommendation:  Discharge Diet Orders (From admission, onward)     Start     Ordered   06/05/23 0000  Diet - low sodium heart healthy        06/05/23 1053           Cardiac and Carb modified diet DISCHARGE MEDICATION: Allergies as of 06/05/2023       Reactions   Bee Venom Anaphylaxis   Peanut-containing Drug Products Itching   Aleve [naproxen Sodium] Other (See Comments)   Spits up blood   Doxycycline Other (See Comments)   Malaise, GI upset, "felt drunk" and very ill   Keflex [cephalexin] Other (See Comments)   N&V & weakness and neck was swollen   Metformin And Related Other (See Comments)   Chills, dizziness   Penicillins Rash   Has patient had a PCN reaction causing immediate rash, facial/tongue/throat swelling, SOB or lightheadedness with hypotension: Yes Has patient had a PCN reaction causing severe rash involving mucus membranes or skin necrosis: No Has patient had a PCN reaction that required hospitalization No Has patient had a PCN reaction occurring within the last 10 years: No If all of the above answers are "NO", then may proceed with Cephalosporin use.        Medication List     STOP taking these medications    hydrochlorothiazide  12.5 MG tablet Commonly known as: HYDRODIURIL   tiZANidine 4 MG tablet Commonly known as: ZANAFLEX       TAKE these medications    amLODipine 10 MG tablet Commonly known as: NORVASC Take 1 tablet (10 mg total) by mouth daily. What changed:  medication strength how much to take   atorvastatin 40 MG  tablet Commonly known as: LIPITOR Take 1 tablet (40 mg total) by mouth daily.   Basaglar KwikPen 100 UNIT/ML DIAL AND INJECT 18 UNITS UNDER THE SKIN AT BEDTIME   clopidogrel 75 MG tablet Commonly known as: PLAVIX Take 1 tablet (75 mg total) by mouth every morning.   colchicine 0.6 MG tablet Take 1 tablet (0.6 mg total) by mouth daily as needed for gout.   Comfort EZ Pen Needles 31G X 5 MM Misc Generic drug: Insulin Pen Needle Use as instructed to inject insulin daily   Insupen Pen Needles 31G X 5 MM Misc Generic drug: Insulin Pen Needle Use as directed to inject insulin daily   cyclobenzaprine 5 MG tablet Commonly known as: FLEXERIL Take 1 tablet (5 mg total) by mouth 2 (two) times daily as needed for up to 15 days for muscle spasms (sedation precautions). What changed: Another medication with the same name was removed. Continue taking this medication, and follow the directions you see here.   famotidine 20 MG tablet Commonly known as: Pepcid Take 1 tablet (20 mg total) by mouth at bedtime.   guaiFENesin 600 MG 12 hr tablet Commonly known as: MUCINEX Take 1 tablet (600 mg total) by mouth 2 (two) times daily for 15 days.   hydrALAZINE 50 MG tablet Commonly known as: APRESOLINE Take 1 tablet (50 mg total) by mouth at breakfast and at bedtime.   levofloxacin 750 MG tablet Commonly known as: Levaquin Take 1 tablet (750 mg total) by mouth daily for 5 days.   losartan 100 MG tablet Commonly known as: COZAAR Take 1 tablet (100 mg total) by mouth every morning.   metoprolol succinate 25 MG 24 hr tablet Commonly known as: TOPROL-XL Take 0.5 tablets (12.5 mg total) by mouth daily.   Miconazole 2 % Powd Apply 1 Application topically 2 (two) times daily as needed (fungal skin infection).   nitroGLYCERIN 0.4 MG SL tablet Commonly known as: NITROSTAT Place 0.4 mg under the tongue every 5 (five) minutes as needed for chest pain.   OneTouch Delica Lancets 33G Misc Use as  directed to test blood sugar once daily.   OneTouch Delica Plus Lancet33G Misc Use as instructed to check blood sugar once a day   OneTouch Verio test strip Generic drug: glucose blood USE TO TEST BLOOD SUGAR 4 TIMES DAILY AS DIRECTED   OneTouch Verio w/Device Kit Use to check blood sugar up to 4 times a day as directed   potassium chloride 10 MEQ tablet Commonly known as: KLOR-CON Take 1 tablet (10 mEq total) by mouth every Monday, Wednesday, and Friday. Start taking on: June 06, 2023   Vitamin D 50 MCG (2000 UT) Caps Take 2,000 Units by mouth daily.        Discharge Exam: Filed Weights   06/03/23 2210  Weight: 82.9 kg      06/05/2023    8:02 AM 06/05/2023    3:57 AM 06/04/2023   11:50 PM  Vitals with BMI  Systolic 167 161 161  Diastolic 93 66 51  Pulse 71 66 58    General - Elderly obese African-American female, lying, no apparent distress  HEENT - PERRLA, EOMI, atraumatic head, non tender sinuses. Lung - Clear, basal rhonchi, diffuse wheezes. Heart - S1, S2 heard, no murmurs, rubs, trace pedal edema. Abdomen - Soft, non tender, obese, bowel sounds good Neuro - Alert, awake and oriented x 3, non focal exam. Skin - Warm and dry.  Condition at discharge: stable  The results of significant diagnostics from this hospitalization (including imaging, microbiology, ancillary and laboratory) are listed below for reference.   Imaging Studies: US Venous Img Lower Unilateral Left Result Date: 06/04/2023 CLINICAL DATA:  Left lower extremity pain EXAM: LEFT LOWER EXTREMITY VENOUS DOPPLER ULTRASOUND TECHNIQUE: Gray-scale sonography with compression, as well as color and duplex ultrasound, were performed to evaluate the deep venous system(s) from the level of the common femoral vein through the popliteal and proximal calf veins. COMPARISON:  None Available. FINDINGS: VENOUS Normal compressibility of the common femoral, superficial femoral, and popliteal veins, as well as the  visualized calf veins. Visualized portions of profunda femoral vein and great saphenous vein unremarkable. No filling defects to suggest DVT on grayscale or color Doppler imaging. Doppler waveforms show normal direction of venous flow, normal respiratory plasticity and response to augmentation. Limited views of the contralateral common femoral vein are unremarkable. OTHER None. Limitations: none IMPRESSION: Negative. Electronically Signed   By: Malachy Moan M.D.   On: 06/04/2023 06:40   DG Chest 2 View Result Date: 06/03/2023 CLINICAL DATA:  Cough and shortness of breath. Decreased appetite. Weakness. EXAM: CHEST - 2 VIEW COMPARISON:  One-view chest x-ray 11/14/2022. FINDINGS: The heart size normal. Atherosclerotic calcifications are present at the aortic arch. No edema or effusion is present. No focal airspace disease is present. Lung volumes are low. IMPRESSION: 1. Low lung volumes. 2. No acute cardiopulmonary disease. Electronically Signed   By: Marin Roberts M.D.   On: 06/03/2023 18:16    Microbiology: Results for orders placed or performed during the hospital encounter of 06/03/23  Resp panel by RT-PCR (RSV, Flu A&B, Covid) Anterior Nasal Swab     Status: Abnormal   Collection Time: 06/03/23  5:42 PM   Specimen: Anterior Nasal Swab  Result Value Ref Range Status   SARS Coronavirus 2 by RT PCR NEGATIVE NEGATIVE Final    Comment: (NOTE) SARS-CoV-2 target nucleic acids are NOT DETECTED.  The SARS-CoV-2 RNA is generally detectable in upper respiratory specimens during the acute phase of infection. The lowest concentration of SARS-CoV-2 viral copies this assay can detect is 138 copies/mL. A negative result does not preclude SARS-Cov-2 infection and should not be used as the sole basis for treatment or other patient management decisions. A negative result may occur with  improper specimen collection/handling, submission of specimen other than nasopharyngeal swab, presence of viral  mutation(s) within the areas targeted by this assay, and inadequate number of viral copies(<138 copies/mL). A negative result must be combined with clinical observations, patient history, and epidemiological information. The expected result is Negative.  Fact Sheet for Patients:  BloggerCourse.com  Fact Sheet for Healthcare Providers:  SeriousBroker.it  This test is no t yet approved or cleared by the [place] FDA and  has been authorized for detection and/or diagnosis of SARS-CoV-2 by FDA under an Emergency Use Authorization (EUA). This EUA will remain  in effect (meaning this test can be used) for the duration of the COVID-19 declaration under Section 564(b)(1) of the Act, 21 U.S.C.section 360bbb-3(b)(1), unless the authorization is terminated  or revoked sooner.       Influenza A by PCR  POSITIVE (A) NEGATIVE Final   Influenza B by PCR NEGATIVE NEGATIVE Final    Comment: (NOTE) The Xpert Xpress SARS-CoV-2/FLU/RSV plus assay is intended as an aid in the diagnosis of influenza from Nasopharyngeal swab specimens and should not be used as a sole basis for treatment. Nasal washings and aspirates are unacceptable for Xpert Xpress SARS-CoV-2/FLU/RSV testing.  Fact Sheet for Patients: BloggerCourse.com  Fact Sheet for Healthcare Providers: SeriousBroker.it  This test is not yet approved or cleared by the [place] FDA and has been authorized for detection and/or diagnosis of SARS-CoV-2 by FDA under an Emergency Use Authorization (EUA). This EUA will remain in effect (meaning this test can be used) for the duration of the COVID-19 declaration under Section 564(b)(1) of the Act, 21 U.S.C. section 360bbb-3(b)(1), unless the authorization is terminated or revoked.     Resp Syncytial Virus by PCR POSITIVE (A) NEGATIVE Final    Comment: (NOTE) Fact Sheet for  Patients: BloggerCourse.com  Fact Sheet for Healthcare Providers: SeriousBroker.it  This test is not yet approved or cleared by the [place] FDA and has been authorized for detection and/or diagnosis of SARS-CoV-2 by FDA under an Emergency Use Authorization (EUA). This EUA will remain in effect (meaning this test can be used) for the duration of the COVID-19 declaration under Section 564(b)(1) of the Act, 21 U.S.C. section 360bbb-3(b)(1), unless the authorization is terminated or revoked.  Performed at [facility], [address] [place], [place] [address]     Labs: CBC: Recent Labs  Lab 06/03/23 1743 06/04/23 0357  WBC 6.1 5.6  NEUTROABS 3.9  --   HGB 10.8* 10.9*  HCT 33.8* 33.7*  MCV 86.9 84.9  PLT 163 169   Basic Metabolic Panel: Recent Labs  Lab 06/03/23 1743 06/04/23 0357  NA 130* 136  K 4.0 3.4*  CL 92* 100  CO2 24 25  GLUCOSE 511* 180*  BUN 45* 32*  CREATININE 1.94* 1.12*  CALCIUM 8.8* 8.4*   Liver Function Tests: Recent Labs  Lab 06/03/23 1743  AST 29  ALT 18  ALKPHOS 74  BILITOT 1.3*  PROT 6.7  ALBUMIN 3.2*   CBG: Recent Labs  Lab 06/04/23 1151 06/04/23 1643 06/04/23 1724 06/04/23 2141 06/05/23 0803  GLUCAP 97 230* 208* 255* 191*    Discharge time spent: 35 minutes.  Signed: Marcelino Duster, MD [facility] 06/05/2023

## 2023-06-05 NOTE — Plan of Care (Signed)

## 2023-06-05 NOTE — Evaluation (Signed)
Occupational Therapy Evaluation Patient Details Name: Kristen [name] MRN: [id number] DOB: 04-19-1941 Today's Date: 06/05/2023   History of Present Illness   History of Present Illness: Pt is a 83 y.o. female with PMH of CAD, DM2,  GERD, gout, and dyslipidemia, who presented to the ER with acute onset of intermittent cough with chest congestion and expectoration of yellowish sputum as well as dyspnea and wheezing since last week. Admitted for further management of hyperglycemia, AKI, flu and RSV infection     Clinical Impressions Pt was seen for OT evaluation this date. Prior to hospital admission, pt was living at home alone and reports IND with all ADLs/IADLs, mobility, driving and community mobile. Family is nearby and checks in as needed.   Pt presents to acute OT demonstrating no functional decline or impairment in ADL performance and functional mobility. She demo bed mobility MOD I, STS with SUP and in room mobility without AD to the bathroom for toilet transfer with SUP. No LOB during session, reports she is walking at baseline, but that the gown and socks she is not used to. Do not anticipate the need for follow up OT services upon acute hospital DC.      If plan is discharge home, recommend the following:   Help with stairs or ramp for entrance;Assist for transportation     Functional Status Assessment   Patient has not had a recent decline in their functional status     Equipment Recommendations   None recommended by OT     Recommendations for Other Services         Precautions/Restrictions   Precautions Precautions: Fall Restrictions Weight Bearing Restrictions Per Provider Order: No     Mobility Bed Mobility Overal bed mobility: Modified Independent                  Transfers Overall transfer level: Modified independent                 General transfer comment: MOD I for bed mobility, STS and supervision for mobility in the room  with no AD use      Balance                                           ADL either performed or assessed with clinical judgement   ADL Overall ADL's : At baseline                                       General ADL Comments: SUP for all tasks, toilet transfer, etc.     Vision         Perception         Praxis         Pertinent Vitals/Pain Pain Assessment Pain Assessment: No/denies pain     Extremity/Trunk Assessment Upper Extremity Assessment Upper Extremity Assessment: Overall WFL for tasks assessed   Lower Extremity Assessment Lower Extremity Assessment: Overall WFL for tasks assessed       Communication Communication Communication: No apparent difficulties   Cognition Arousal: Alert Behavior During Therapy: WFL for tasks assessed/performed Cognition: No apparent impairments             OT - Cognition Comments: A and 0x4  Cueing  General Comments   Cueing Techniques: Verbal cues      Exercises     Shoulder Instructions      Home Living Family/patient expects to be discharged to:: Private residence Living Arrangements: Alone Available Help at Discharge: Family;Available PRN/intermittently Type of Home: House Home Access: Stairs to enter [employer] of Steps: 2 at the back with R railing going up Entrance Stairs-Rails: Right Home Layout: One level     Bathroom Shower/Tub: Producer, television/film/video: Handicapped height Bathroom Accessibility: Yes   Home Equipment: Agricultural consultant (2 wheels);Shower seat;Grab bars - tub/shower;Hand held shower head;Grab bars - toilet;Cane - single point   Additional Comments: only uses the shower seat and grab bars      Prior Functioning/Environment Prior Level of Function : Independent/Modified Independent;Driving             Mobility Comments: no AD use for mobility; drives; denies falls ADLs Comments: IND with  ADL/IADLs, does her own grocery shopping    OT Problem List:     OT Treatment/Interventions:        OT Goals(Current goals can be found in the care plan section)       OT Frequency:       Co-evaluation              AM-PAC OT "6 Clicks" Daily Activity     Outcome Measure Help from another person eating meals?: None Help from another person taking care of personal grooming?: None Help from another person toileting, which includes using toliet, bedpan, or urinal?: None Help from another person bathing (including washing, rinsing, drying)?: None Help from another person to put on and taking off regular upper body clothing?: None Help from another person to put on and taking off regular lower body clothing?: None 6 Click Score: 24   End of Session Nurse Communication: Mobility status  Activity Tolerance: Patient tolerated treatment well Patient left: in bed;with call bell/phone within reach;with bed alarm set  OT Visit Diagnosis: Other abnormalities of gait and mobility (R26.89)                Time: 6962-9528 OT Time Calculation (min): 17 min Charges:  OT General Charges $OT Visit: 1 Visit OT Evaluation $OT Eval Low Complexity: 1 Low [place] Nonaka, OTR/L 06/05/23, 12:15 PM  Atlee Kluth E Sharmain Lastra 06/05/2023, 12:12 PM

## 2023-06-05 NOTE — Progress Notes (Signed)
Medications delivered to patient.

## 2023-06-06 ENCOUNTER — Other Ambulatory Visit: Payer: Self-pay

## 2023-06-06 ENCOUNTER — Telehealth: Payer: Self-pay

## 2023-06-06 NOTE — Patient Outreach (Signed)
Care Management  Transitions of Care Program Transitions of Care Post-discharge Initial   06/06/2023 Name: Kristen [name] MRN: [id number] DOB: July 29, 1940  Subjective: Kristen [name] is a 83 y.o. year old female who is a primary care patient of Eustaquio Boyden, MD. The Care Management team Engaged with patient Engaged with patient by telephone to assess and address transitions of care needs.   Consent to Services:  Patient was given information about care management services, agreed to services, and gave verbal consent to participate.   Assessment:  Date of Discharge: 06/05/23 Discharge Facility: [name]) Type of Discharge: Inpatient Admission Primary Inpatient Discharge Diagnosis:: Flu, Bronchitis  SDOH Interventions    Flowsheet Row Telephone from 06/06/2023 in [place] POPULATION HEALTH DEPARTMENT Clinical Support from 05/10/2023 in [name] at [place] Telephone from 11/20/2022 in [facility] [employer] [facility] Care Coordination Clinical Support from 04/13/2022 in [facility] at [facility] Patient Outreach Telephone from 08/08/2021 in [facility] [employer] [facility] Care Coordination Chronic Care Management from 06/25/2020 in [facility] at [place]  SDOH Interventions        Food Insecurity Interventions Intervention Not Indicated Intervention Not Indicated Intervention Not Indicated Intervention Not Indicated Intervention Not Indicated --  Housing Interventions Intervention Not Indicated Intervention Not Indicated -- Intervention Not Indicated Intervention Not Indicated --  Transportation Interventions Intervention Not Indicated Intervention Not Indicated Intervention Not Indicated Intervention Not Indicated Intervention Not Indicated --  Utilities Interventions Intervention Not Indicated Intervention Not Indicated -- Intervention Not Indicated -- --  Alcohol Usage  Interventions -- Intervention Not Indicated (Score <7) -- -- -- --  Financial Strain Interventions -- Intervention Not Indicated -- Intervention Not Indicated -- Other (Comment)  [Basaglar and Januvia through PAP]  Physical Activity Interventions -- Intervention Not Indicated -- -- -- --  Stress Interventions -- Intervention Not Indicated -- Intervention Not Indicated -- --  Social Connections Interventions -- Intervention Not Indicated -- Intervention Not Indicated -- --  Health Literacy Interventions -- Intervention Not Indicated -- -- -- --        Goals Addressed             This Visit's Progress    Patient Stated       Current Barriers:  Chronic Disease Management support and education needs related to DMII   RNCM Clinical Goal(s):  Patient will work with the Care Management team over the next 30 days to address Transition of Care Barriers: Medication access Medication Management Diet/Nutrition/Food Resources Support at home Provider appointments Functional/Safety verbalize basic understanding of  DMII disease process and self health management plan as evidenced by controlled blood sugars, ED prevention  through collaboration with RN Care manager, provider, and care team.   Interventions: Evaluation of current treatment plan related to  self management and patient's adherence to plan as established by provider   Diabetes Interventions:  (Status:  New goal.) Short Term Goal Assessed patient's understanding of A1c goal: <7% Provided education to patient about basic DM disease process Reviewed medications with patient and discussed importance of medication adherence Counseled on importance of regular laboratory monitoring as prescribed Discussed plans with patient for ongoing care management follow up and provided patient with direct contact information for care management team Reviewed scheduled/upcoming provider appointments including: PCP on 06/13/23 and repeat lab  work Lab Results  Component Value Date   HGBA1C 9.2 (A) 03/26/2023    Patient Goals/Self-Care Activities: Participate in Transition of Care  Program/Attend TOC scheduled calls Notify RN Care Manager of TOC call rescheduling needs Take all medications as prescribed Attend all scheduled provider appointments Call pharmacy for medication refills 3-7 days in advance of running out of medications Perform all self care activities independently  Call provider office for new concerns or questions   Follow Up Plan:  Telephone follow up appointment with care management team member scheduled for:  Thursday February 20th at 2:00pm        Interventions Today    Flowsheet Row Most Recent Value  Chronic Disease   Chronic disease during today's visit Diabetes  General Interventions   General Interventions Discussed/Reviewed General Interventions Discussed  Exercise Interventions   Exercise Discussed/Reviewed Physical Activity  Physical Activity Discussed/Reviewed Physical Activity Discussed  Education Interventions   Education Provided Provided Education  Provided Verbal Education On Insurance Plans, Medication, Blood Sugar Monitoring, When to see the doctor  Pharmacy Interventions   Pharmacy Dicussed/Reviewed Medications and their functions  Safety Interventions   Safety Discussed/Reviewed Safety Reviewed       TOC outreach completed today. The patient was in the hospital for Flu and Bronchitis. She states that she was sent home on medication to bring up the phlegm. Follow up hospital visit scheduled with PCP. Reviewed how the infection affects DM. Reviewed sick day rules. She states her Blood sugar has been going up before the hospitalization and while in the hospital it went as high as 500. The patient states her appetite is not good right now and her blood sugars are still around 200. She lives with her son and states she tries to eat healthy. Agreed to the [facility] program for medication  management and support/education as indicated.  Routine follow-up and on-going assessment evaluation and education of disease processes, and recommended interventions for both chronic and acute medical conditions, will occur during each weekly visit during [facility] 30-day Program Outreach calls along with ongoing review of symptoms, medication reviews and reconciliation. Any updates, inconsistencies, discrepancies or acute care concerns will be addressed on the Care Plan and routed to the correct Practitioner if indicated.    The patient has been provided with contact information for the care management team and has been advised to call with any health-related questions or concerns. The patient verbalized understanding with current POC. The patient is directed to their insurance card regarding availability of benefits coverage.  Deidre Ala, BSN, RN [place]  VBCI - [employer] [facility] RN Care Manager [phone]

## 2023-06-06 NOTE — Transitions of Care (Post Inpatient/ED Visit) (Deleted)
   06/06/2023  Name: Kristen [name] MRN: [id number] DOB: 10-13-1940  {[id number]}

## 2023-06-07 ENCOUNTER — Other Ambulatory Visit: Payer: Self-pay

## 2023-06-12 ENCOUNTER — Encounter: Payer: Self-pay | Admitting: Gastroenterology

## 2023-06-13 ENCOUNTER — Other Ambulatory Visit (HOSPITAL_COMMUNITY): Payer: Self-pay

## 2023-06-13 ENCOUNTER — Other Ambulatory Visit (HOSPITAL_BASED_OUTPATIENT_CLINIC_OR_DEPARTMENT_OTHER): Payer: Self-pay

## 2023-06-13 ENCOUNTER — Other Ambulatory Visit: Payer: Self-pay | Admitting: Family Medicine

## 2023-06-13 ENCOUNTER — Inpatient Hospital Stay: Payer: PPO | Admitting: Family Medicine

## 2023-06-13 ENCOUNTER — Other Ambulatory Visit: Payer: Self-pay

## 2023-06-13 ENCOUNTER — Other Ambulatory Visit: Payer: PPO

## 2023-06-14 ENCOUNTER — Other Ambulatory Visit: Payer: Self-pay

## 2023-06-14 ENCOUNTER — Other Ambulatory Visit (HOSPITAL_COMMUNITY): Payer: Self-pay

## 2023-06-14 MED ORDER — HYDRALAZINE HCL 50 MG PO TABS
50.0000 mg | ORAL_TABLET | Freq: Two times a day (BID) | ORAL | 11 refills | Status: AC
  Filled 2023-06-15 – 2023-06-19 (×2): qty 60, 30d supply, fill #0
  Filled 2023-06-28 – 2023-07-12 (×3): qty 60, 30d supply, fill #1
  Filled 2023-07-30 – 2023-08-07 (×2): qty 60, 30d supply, fill #2
  Filled 2023-08-16 – 2023-09-04 (×2): qty 60, 30d supply, fill #3
  Filled 2023-09-24 – 2023-10-02 (×2): qty 60, 30d supply, fill #4
  Filled 2023-10-22 – 2023-11-01 (×2): qty 60, 30d supply, fill #5
  Filled 2023-11-29: qty 60, 30d supply, fill #6
  Filled 2024-01-01: qty 60, 30d supply, fill #7
  Filled 2024-01-31: qty 60, 30d supply, fill #8
  Filled 2024-03-03: qty 60, 30d supply, fill #9
  Filled 2024-04-03: qty 60, 30d supply, fill #10
  Filled 2024-05-05: qty 60, 30d supply, fill #11
  Filled ????-??-??: fill #1

## 2023-06-14 NOTE — Patient Instructions (Signed)
 Visit Information  Thank you for taking time to visit with me today. Please don't hesitate to contact me if I can be of assistance to you before our next scheduled telephone appointment.  Our next appointment is by telephone on Thursday February 27th at 2pm  Following is a copy of your care plan:   Goals Addressed             This Visit's Progress    TOC Care Plan       Current Barriers: (reviewed 06/14/23) Chronic Disease Management support and education needs related to DMII   RNCM Clinical Goal(s): (reviewed 06/14/23) Patient will work with the Care Management team over the next 30 days to address Transition of Care Barriers: Medication access Medication Management Diet/Nutrition/Food Resources Support at home Provider appointments Functional/Safety verbalize basic understanding of  DMII disease process and self health management plan as evidenced by controlled blood sugars, ED prevention  through collaboration with RN Care manager, provider, and care team.   Interventions: (reviewed 06/14/23) Evaluation of current treatment plan related to  self management and patient's adherence to plan as established by provider   Diabetes Interventions:  (Status:  Goal on track:  Yes.) Short Term Goal (reviewed 06/14/23) Assessed patient's understanding of A1c goal: <7% Provided education to patient about basic DM disease process Reviewed medications with patient and discussed importance of medication adherence Counseled on importance of regular laboratory monitoring as prescribed Discussed plans with patient for ongoing care management follow up and provided patient with direct contact information for care management team Reviewed scheduled/upcoming provider appointments including: PCP on 06/13/23 and repeat lab work Lab Results  Component Value Date   HGBA1C 9.2 (A) 03/26/2023    Patient Goals/Self-Care Activities: (reviewed 06/14/23) Participate in Transition of Care Program/Attend [facility]  scheduled calls Notify RN Care Manager of [facility] call rescheduling needs Take all medications as prescribed Attend all scheduled provider appointments Call pharmacy for medication refills 3-7 days in advance of running out of medications Perform all self care activities independently  Call provider office for new concerns or questions   Follow Up Plan:  Telephone follow up appointment with care management team member scheduled for:  Thursday February 27th at 2:00pm         The patient verbalized understanding of instructions, educational materials, and care plan provided today and agreed to receive a mailed copy of patient instructions, educational materials, and care plan.   The patient has been provided with contact information for the care management team and has been advised to call with any health related questions or concerns.   Please call the care guide team at [phone] if you need to cancel or reschedule your appointment.   Please call the Suicide and Crisis Lifeline: 988 call the [place] National Suicide Prevention Lifeline: 705-697-7127 or TTY: (859)184-1514 TTY (407) 618-8855) to talk to a trained counselor if you are experiencing a Mental Health or Behavioral Health Crisis or need someone to talk to.  Deidre Ala, BSN, RN [place]  VBCI - [employer] [facility] RN Care Manager [phone]

## 2023-06-14 NOTE — Telephone Encounter (Signed)
 ERx

## 2023-06-14 NOTE — Patient Outreach (Signed)
 Care Management  Transitions of Care Program Transitions of Care Post-discharge week 2   06/14/2023 Name: Kristen [name] MRN: [id number] DOB: [birth year]  Subjective: Kristen [name] is a 83 y.o. year old female who is a primary care patient of Eustaquio Boyden, MD. The Care Management team Engaged with patient Engaged with patient by telephone to assess and address transitions of care needs.   Consent to Services:  Patient was given information about care management services, agreed to services, and gave verbal consent to participate.   Assessment: Outreach completed with the patient today. She states she is recovered from the Flu and is feeling better. She left her house yesterday to go to the store and get some groceries. She stated she tolerated the outing fine. She does not take her blood sugar every day. She only takes it when she feels like it but she did take it yesterday and it was 198. She likes to review her medications with a SN once in awhile and she appreciates the support that the team is providing. Reviewed her benefits in regards to obtaining food because she worries about running out. She receives about $68.00 a month in assistance and she declines referral to [facility] or resources for Food banks.       SDOH Interventions    Flowsheet Row Patient Outreach from 06/14/2023 in [place] POPULATION HEALTH DEPARTMENT Telephone from 06/06/2023 in [place] POPULATION HEALTH DEPARTMENT Clinical Support from 05/10/2023 in [facility] at [place] Telephone from 11/20/2022 in [facility] Care Coordination Clinical Support from 04/13/2022 in [facility] at [facility] Patient Outreach Telephone from 08/08/2021 in [facility] [employer] [facility] Care Coordination  SDOH Interventions        Food Insecurity Interventions Intervention Not Indicated Intervention Not Indicated Intervention Not Indicated Intervention Not Indicated  Intervention Not Indicated Intervention Not Indicated  Housing Interventions Intervention Not Indicated Intervention Not Indicated Intervention Not Indicated -- Intervention Not Indicated Intervention Not Indicated  Transportation Interventions Intervention Not Indicated Intervention Not Indicated Intervention Not Indicated Intervention Not Indicated Intervention Not Indicated Intervention Not Indicated  Utilities Interventions Intervention Not Indicated Intervention Not Indicated Intervention Not Indicated -- Intervention Not Indicated --  Alcohol Usage Interventions -- -- Intervention Not Indicated (Score <7) -- -- --  Financial Strain Interventions -- -- Intervention Not Indicated -- Intervention Not Indicated --  Physical Activity Interventions -- -- Intervention Not Indicated -- -- --  Stress Interventions -- -- Intervention Not Indicated -- Intervention Not Indicated --  Social Connections Interventions -- -- Intervention Not Indicated -- Intervention Not Indicated --  Health Literacy Interventions -- -- Intervention Not Indicated -- -- --        Goals Addressed             This Visit's Progress    TOC Care Plan       Current Barriers: (reviewed 06/14/23) Chronic Disease Management support and education needs related to DMII   RNCM Clinical Goal(s): (reviewed 06/14/23) Patient will work with the Care Management team over the next 30 days to address Transition of Care Barriers: Medication access Medication Management Diet/Nutrition/Food Resources Support at home Provider appointments Functional/Safety verbalize basic understanding of  DMII disease process and self health management plan as evidenced by controlled blood sugars, ED prevention  through collaboration with RN Care manager, provider, and care team.   Interventions: (reviewed 06/14/23) Evaluation of current treatment plan related to  self management and patient's adherence to plan  as established by  provider   Diabetes Interventions:  (Status:  Goal on track:  Yes.) Short Term Goal (reviewed 06/14/23) Assessed patient's understanding of A1c goal: <7% Provided education to patient about basic DM disease process Reviewed medications with patient and discussed importance of medication adherence Counseled on importance of regular laboratory monitoring as prescribed Discussed plans with patient for ongoing care management follow up and provided patient with direct contact information for care management team Reviewed scheduled/upcoming provider appointments including: PCP on 06/13/23 and repeat lab work Lab Results  Component Value Date   HGBA1C 9.2 (A) 03/26/2023    Patient Goals/Self-Care Activities: (reviewed 06/14/23) Participate in Transition of Care Program/Attend [facility] scheduled calls Notify RN Care Manager of [facility] call rescheduling needs Take all medications as prescribed Attend all scheduled provider appointments Call pharmacy for medication refills 3-7 days in advance of running out of medications Perform all self care activities independently  Call provider office for new concerns or questions   Follow Up Plan:  Telephone follow up appointment with care management team member scheduled for:  Thursday February 27th at 2:00pm         Plan: The patient has been provided with contact information for the care management team and has been advised to call with any health related questions or concerns.   Routine follow-up and on-going assessment evaluation and education of disease processes, and recommended interventions for both chronic and acute medical conditions, will occur during each weekly visit during [facility] 30-day Program Outreach calls along with ongoing review of symptoms, medication reviews and reconciliation. Any updates, inconsistencies, discrepancies or acute care concerns will be addressed on the Care Plan and routed to the correct Practitioner if indicated.    The  patient has been provided with contact information for the care management team and has been advised to call with any health-related questions or concerns. The patient verbalized understanding with current POC. The patient is directed to their insurance card regarding availability of benefits coverage.  Deidre Ala, BSN, RN [place]  VBCI - [employer] [facility] RN Care Manager [phone]

## 2023-06-15 ENCOUNTER — Other Ambulatory Visit: Payer: Self-pay

## 2023-06-18 ENCOUNTER — Other Ambulatory Visit: Payer: Self-pay

## 2023-06-19 ENCOUNTER — Other Ambulatory Visit (HOSPITAL_COMMUNITY): Payer: Self-pay

## 2023-06-19 ENCOUNTER — Other Ambulatory Visit: Payer: Self-pay

## 2023-06-20 ENCOUNTER — Other Ambulatory Visit: Payer: Self-pay

## 2023-06-21 ENCOUNTER — Other Ambulatory Visit: Payer: Self-pay

## 2023-06-21 ENCOUNTER — Other Ambulatory Visit (HOSPITAL_COMMUNITY): Payer: Self-pay

## 2023-06-21 NOTE — Patient Outreach (Signed)
 Care Management  Transitions of Care Program Transitions of Care Post-discharge week 3   06/21/2023 Name: Kristen [name] MRN: [id number] DOB: 1940/06/27  Subjective: Kristen [name] is a 83 y.o. year old female who is a primary care patient of Kristen Boyden, MD. The Care Management team Engaged with patient Engaged with patient by telephone to assess and address transitions of care needs.   Consent to Services:  Patient was given information about care management services, agreed to services, and gave verbal consent to participate.   Assessment: TOC Outreach today. Detailed review and reconciliation of medications involving the [place] Compliance Packaging Team. The patient has had some changes in her medications and was confused because her bubble packs looked different. Confirmed with pharmacy the changes and spend time education the patient who still doubts the packages. She states she has enough of her package medication until she sees her PCP 07/17/23. Tried to move the appointment up earlier and she declined. She states otherwise she is getting out of her house and she is independent. RNCM to follow up next week.      SDOH Interventions    Flowsheet Row Patient Outreach from 06/14/2023 in [place] POPULATION HEALTH DEPARTMENT Telephone from 06/06/2023 in [place] POPULATION HEALTH DEPARTMENT Clinical Support from 05/10/2023 in [facility] at [place] Telephone from 11/20/2022 in [facility] Care Coordination Clinical Support from 04/13/2022 in [facility] at [facility] Patient Outreach Telephone from 08/08/2021 in [facility] [employer] Care Coordination  SDOH Interventions        Food Insecurity Interventions Intervention Not Indicated Intervention Not Indicated Intervention Not Indicated Intervention Not Indicated Intervention Not Indicated Intervention Not Indicated  Housing Interventions Intervention  Not Indicated Intervention Not Indicated Intervention Not Indicated -- Intervention Not Indicated Intervention Not Indicated  Transportation Interventions Intervention Not Indicated Intervention Not Indicated Intervention Not Indicated Intervention Not Indicated Intervention Not Indicated Intervention Not Indicated  Utilities Interventions Intervention Not Indicated Intervention Not Indicated Intervention Not Indicated -- Intervention Not Indicated --  Alcohol Usage Interventions -- -- Intervention Not Indicated (Score <7) -- -- --  Financial Strain Interventions -- -- Intervention Not Indicated -- Intervention Not Indicated --  Physical Activity Interventions -- -- Intervention Not Indicated -- -- --  Stress Interventions -- -- Intervention Not Indicated -- Intervention Not Indicated --  Social Connections Interventions -- -- Intervention Not Indicated -- Intervention Not Indicated --  Health Literacy Interventions -- -- Intervention Not Indicated -- -- --        Goals Addressed             This Visit's Progress    TOC Care Plan   On track    Current Barriers: (reviewed 06/21/23) Chronic Disease Management support and education needs related to DMII   RNCM Clinical Goal(s): (reviewed 06/21/23) Patient will work with the Care Management team over the next 30 days to address Transition of Care Barriers: Medication access Medication Management Diet/Nutrition/Food Resources Support at home Provider appointments Functional/Safety verbalize basic understanding of  DMII disease process and self health management plan as evidenced by controlled blood sugars, ED prevention  through collaboration with RN Care manager, provider, and care team.   Interventions: (reviewed 06/21/23) Evaluation of current treatment plan related to  self management and patient's adherence to plan as established by provider   Diabetes Interventions:  (Status:  Goal on track:  Yes.) Short Term Goal (reviewed  06/21/23) Assessed patient's understanding of A1c  goal: <7% Provided education to patient about basic DM disease process Counseled on importance of regular laboratory monitoring as prescribed Discussed plans with patient for ongoing care management follow up and provided patient with direct contact information for care management team Reviewed scheduled/upcoming provider appointments including: PCP on 06/13/23 and repeat lab work Lab Results  Component Value Date   HGBA1C 9.2 (A) 03/26/2023    Patient Goals/Self-Care Activities: (reviewed 06/21/23) Participate in Transition of Care Program/Attend TOC scheduled calls Take all medications as prescribed Attend all scheduled provider appointments Call pharmacy for medication refills 3-7 days in advance of running out of medications Call provider office for new concerns or questions   Follow Up Plan:  Telephone follow up appointment with care management team member scheduled for:  Wednesday March 5 at 1:30pm        Patient educated on red flags signs/symptoms to watch for and was encouraged to report any of these identified, any new symptoms, changes in baseline or medication regimen, change in health status / well-being, or safety concerns to PCP and / or the [facility] Case Management team.   The patient has been provided with contact information for the care management team and has been advised to call with any health-related questions or concerns. The patient verbalized understanding with current POC. The patient is directed to their insurance card regarding availability of benefits coverage.   Deidre Ala, BSN, RN [place]  VBCI - [employer] [facility] RN Care Manager [phone]

## 2023-06-25 ENCOUNTER — Other Ambulatory Visit: Payer: Self-pay

## 2023-06-26 ENCOUNTER — Other Ambulatory Visit: Payer: Self-pay

## 2023-06-27 ENCOUNTER — Other Ambulatory Visit: Payer: Self-pay

## 2023-06-27 NOTE — Patient Outreach (Signed)
 Care Management  Transitions of Care Program Transitions of Care Post-discharge week 4   06/27/2023 Name: Kristen [name] MRN: [id number] DOB: 23-Sep-1940  Subjective: Kristen [name] is a 83 y.o. year old female who is a primary care patient of Eustaquio Boyden, MD. The Care Management team Engaged with patient Engaged with patient by telephone to assess and address transitions of care needs.   Consent to Services:  Patient was given information about care management services, agreed to services, and gave verbal consent to participate.   Assessment: TOC Outreach completed to the patient. She has not had much change since the week before and she states she is comfortable with her medication the way it is until she meets with Dr. Sharen Hones on the 25th of March. RNCM will make one last TOC call on 07/18/23 and will discuss with the patient if she wants to continue with the [facility] Longitudinal program.    SDOH Interventions    Flowsheet Row Patient Outreach from 06/14/2023 in [place] [facility] Telephone from 06/06/2023 in [facility] Clinical Support from 05/10/2023 in [facility] [place] Telephone from 11/20/2022 in [facility] Care Coordination Clinical Support from 04/13/2022 in [facility] Patient Outreach Telephone from 08/08/2021 in [facility] Care Coordination  SDOH Interventions        Food Insecurity Interventions Intervention Not Indicated Intervention Not Indicated Intervention Not Indicated Intervention Not Indicated Intervention Not Indicated Intervention Not Indicated  Housing Interventions Intervention Not Indicated Intervention Not Indicated Intervention Not Indicated -- Intervention Not Indicated Intervention Not Indicated  Transportation Interventions Intervention Not Indicated Intervention Not Indicated Intervention Not Indicated  Intervention Not Indicated Intervention Not Indicated Intervention Not Indicated  Utilities Interventions Intervention Not Indicated Intervention Not Indicated Intervention Not Indicated -- Intervention Not Indicated --  Alcohol Usage Interventions -- -- Intervention Not Indicated (Score <7) -- -- --  Financial Strain Interventions -- -- Intervention Not Indicated -- Intervention Not Indicated --  Physical Activity Interventions -- -- Intervention Not Indicated -- -- --  Stress Interventions -- -- Intervention Not Indicated -- Intervention Not Indicated --  Social Connections Interventions -- -- Intervention Not Indicated -- Intervention Not Indicated --  Health Literacy Interventions -- -- Intervention Not Indicated -- -- --        Goals Addressed             This Visit's Progress    TOC Care Plan   On track    Current Barriers: (Reviewed June 27, 2023) Chronic Disease Management support and education needs related to DMII   RNCM Clinical Goal(s): (Reviewed June 27, 2023) Patient will work with the Care Management team over the next 30 days to address Transition of Care Barriers: Medication access Medication Management Diet/Nutrition/Food Resources Provider appointments verbalize basic understanding of  DMII disease process and self health management plan as evidenced by controlled blood sugars, ED prevention  through collaboration with RN Care manager, provider, and care team. The patient does not take her blood sugar daily  Interventions: (Reviewed June 27, 2023) Evaluation of current treatment plan related to  self management and patient's adherence to plan as established by provider  (Reviewed June 27, 2023) Diabetes Interventions:  (Status:  Goal on track:  Yes.) Short Term Goal Assessed patient's understanding of A1c goal: <7% Provided education to patient about basic DM disease process Counseled on importance of regular laboratory monitoring as prescribed Discussed plans  with patient for ongoing care management follow up and provided patient with direct contact information for care management team Reviewed scheduled/upcoming provider appointments including: PCP on 07/17/23 and repeat lab work Lab Results  Component Value Date   HGBA1C 9.2 (A) 03/26/2023    Patient Goals/Self-Care Activities: (Reviewed June 27, 2023) Participate in Transition of Care Program/Attend [facility] scheduled calls Take all medications as prescribed Attend all scheduled provider appointments Call pharmacy for medication refills 3-7 days in advance of running out of medications Call provider office for new concerns or questions   Follow Up Plan:  Telephone follow up appointment with care management team member scheduled for:  Wednesday March 26 at 1:30pm         Plan: The patient has been provided with contact information for the care management team and has been advised to call with any health related questions or concerns.   Please refer to Care Plan for goals and interventions.  Patient educated on red flags signs/symptoms to watch for and was encouraged to report any of these identified, any new symptoms, changes in baseline or medication regimen, change in health status / well-being, or safety concerns to PCP and / or the [facility] Case Management team.     Deidre Ala, BSN, RN [place]  [facility] - [facility] RN Care Manager [phone]

## 2023-06-28 ENCOUNTER — Other Ambulatory Visit: Payer: Self-pay

## 2023-06-29 NOTE — Telephone Encounter (Signed)
 Pt will need asap OV for hosp f/u. Her visit on 07/17/23 is her physical and ONLY her physical.   Attempted to contact pt. No answer, no vm. Plz schedule hosp f/u mentioned above.

## 2023-06-29 NOTE — Telephone Encounter (Signed)
  Called pt to schedule hospital   follow up and the pt state dthat she's doing good and dont need to come in because she has a appt coming up and she will see Dr .Reece Agar then         Copied from CRM [id number]. Topic: General - Other >> Jun 29, 2023 11:14 AM [name] wrote: Reason for CRM: Kristen [name] calling because she's been trying to connect with patient due to a hospital stay from February 9-11th since patient hasn't followed up with provider from hospital stay. Please reach out to patient for follow up.

## 2023-07-03 NOTE — Telephone Encounter (Signed)
 Looks like pt called back and schedule hosp f/u on 07/04/23 at 12:00.

## 2023-07-03 NOTE — Telephone Encounter (Signed)
 Pt will need asap OV for hosp f/u. Her visit on 07/17/23 is her physical and ONLY her physical.   Attempted to contact pt. No answer, no vm. Plz schedule hosp f/u mentioned above.

## 2023-07-04 ENCOUNTER — Other Ambulatory Visit (HOSPITAL_COMMUNITY): Payer: Self-pay

## 2023-07-04 ENCOUNTER — Encounter: Payer: Self-pay | Admitting: Family Medicine

## 2023-07-04 ENCOUNTER — Telehealth: Payer: Self-pay

## 2023-07-04 ENCOUNTER — Ambulatory Visit (INDEPENDENT_AMBULATORY_CARE_PROVIDER_SITE_OTHER): Admitting: Family Medicine

## 2023-07-04 ENCOUNTER — Other Ambulatory Visit: Payer: Self-pay

## 2023-07-04 VITALS — BP 156/96 | HR 71 | Temp 99.0°F | Ht 61.0 in | Wt 177.0 lb

## 2023-07-04 DIAGNOSIS — N1831 Chronic kidney disease, stage 3a: Secondary | ICD-10-CM

## 2023-07-04 DIAGNOSIS — I1A0 Resistant hypertension: Secondary | ICD-10-CM

## 2023-07-04 DIAGNOSIS — E1165 Type 2 diabetes mellitus with hyperglycemia: Secondary | ICD-10-CM | POA: Diagnosis not present

## 2023-07-04 DIAGNOSIS — Z794 Long term (current) use of insulin: Secondary | ICD-10-CM | POA: Diagnosis not present

## 2023-07-04 DIAGNOSIS — J40 Bronchitis, not specified as acute or chronic: Secondary | ICD-10-CM | POA: Diagnosis not present

## 2023-07-04 DIAGNOSIS — Z79899 Other long term (current) drug therapy: Secondary | ICD-10-CM | POA: Diagnosis not present

## 2023-07-04 LAB — HEMOGLOBIN A1C: Hgb A1c MFr Bld: 13.5 % — ABNORMAL HIGH (ref 4.6–6.5)

## 2023-07-04 LAB — RENAL FUNCTION PANEL
Albumin: 3.6 g/dL (ref 3.5–5.2)
BUN: 25 mg/dL — ABNORMAL HIGH (ref 6–23)
CO2: 30 meq/L (ref 19–32)
Calcium: 9.1 mg/dL (ref 8.4–10.5)
Chloride: 96 meq/L (ref 96–112)
Creatinine, Ser: 1.38 mg/dL — ABNORMAL HIGH (ref 0.40–1.20)
GFR: 35.8 mL/min — ABNORMAL LOW (ref >60.00)
Glucose, Bld: 510 mg/dL (ref 70–99)
Phosphorus: 3.3 mg/dL (ref 2.3–4.6)
Potassium: 4.3 meq/L (ref 3.5–5.1)
Sodium: 133 meq/L — ABNORMAL LOW (ref 135–145)

## 2023-07-04 MED ORDER — METOPROLOL SUCCINATE ER 25 MG PO TB24
ORAL_TABLET | ORAL | 3 refills | Status: AC
  Filled 2023-07-04: qty 45, 90d supply, fill #0
  Filled 2023-07-12 (×2): qty 15, 30d supply, fill #0
  Filled 2023-07-30 – 2023-08-07 (×2): qty 15, 30d supply, fill #1
  Filled 2023-08-16 – 2023-09-04 (×2): qty 15, 30d supply, fill #2
  Filled 2023-09-24 – 2023-10-02 (×2): qty 15, 30d supply, fill #3
  Filled 2023-10-22 – 2023-11-01 (×2): qty 15, 30d supply, fill #4
  Filled 2023-11-29: qty 15, 30d supply, fill #5
  Filled 2023-12-26: qty 15, 30d supply, fill #6
  Filled 2024-01-23 (×2): qty 15, 30d supply, fill #7
  Filled 2024-03-03: qty 15, 30d supply, fill #8
  Filled 2024-03-18 – 2024-03-26 (×2): qty 15, 30d supply, fill #9

## 2023-07-04 MED ORDER — AMLODIPINE BESYLATE 10 MG PO TABS
10.0000 mg | ORAL_TABLET | Freq: Every day | ORAL | 3 refills | Status: DC
  Filled 2023-07-04: qty 90, 90d supply, fill #0
  Filled 2023-07-12: qty 30, 30d supply, fill #0
  Filled 2023-07-30 – 2023-08-07 (×2): qty 30, 30d supply, fill #1
  Filled 2023-08-16 – 2023-09-04 (×2): qty 30, 30d supply, fill #2
  Filled 2023-09-24 – 2023-10-02 (×2): qty 30, 30d supply, fill #3
  Filled 2023-10-22 – 2023-11-01 (×2): qty 30, 30d supply, fill #4
  Filled 2023-11-29: qty 30, 30d supply, fill #5
  Filled 2024-01-01: qty 30, 30d supply, fill #6
  Filled 2024-01-31: qty 30, 30d supply, fill #7

## 2023-07-04 MED ORDER — LOSARTAN POTASSIUM 100 MG PO TABS
100.0000 mg | ORAL_TABLET | Freq: Every morning | ORAL | 3 refills | Status: AC
  Filled 2023-07-04: qty 90, 90d supply, fill #0
  Filled 2023-07-12: qty 30, 30d supply, fill #0
  Filled 2023-07-30 – 2023-08-07 (×2): qty 30, 30d supply, fill #1
  Filled 2023-08-16 – 2023-09-04 (×2): qty 30, 30d supply, fill #2
  Filled 2023-09-24 – 2023-10-02 (×2): qty 30, 30d supply, fill #3
  Filled 2023-10-22 – 2023-11-01 (×2): qty 30, 30d supply, fill #4
  Filled 2023-11-29: qty 30, 30d supply, fill #5
  Filled 2024-01-01: qty 30, 30d supply, fill #6
  Filled 2024-01-31: qty 30, 30d supply, fill #7
  Filled 2024-03-03: qty 30, 30d supply, fill #8
  Filled 2024-04-03: qty 30, 30d supply, fill #9
  Filled 2024-05-05: qty 30, 30d supply, fill #10

## 2023-07-04 MED ORDER — POTASSIUM CHLORIDE ER 10 MEQ PO TBCR
10.0000 meq | EXTENDED_RELEASE_TABLET | ORAL | 3 refills | Status: AC
  Filled 2023-07-04: qty 70, 163d supply, fill #0
  Filled 2023-07-12: qty 38, 89d supply, fill #0
  Filled 2023-07-30: qty 70, 163d supply, fill #0
  Filled 2023-08-07: qty 30, 70d supply, fill #0
  Filled 2023-10-08 (×2): qty 30, 70d supply, fill #1
  Filled 2024-01-01: qty 30, 70d supply, fill #2
  Filled 2024-03-03: qty 30, 70d supply, fill #3

## 2023-07-04 NOTE — Telephone Encounter (Signed)
 CRITICAL VALUE STICKER  CRITICAL VALUE: Glucose 510  RECEIVER (on-site recipient of call): Omari Koslosky   DATE & TIME NOTIFIED: 07/04/23 4:40  MESSENGER (representative from lab): ASA  MD NOTIFIED: Dr. Reece Agar  TIME OF NOTIFICATION:4:45pm

## 2023-07-04 NOTE — Progress Notes (Unsigned)
 Ph: [phone] Fax: [phone]   Patient ID: Kristen [name], female    DOB: 03-18-1941, 83 y.o.   MRN: [id number]  This visit was conducted in person.  BP (!) 156/96 (BP Location: Right Arm, Cuff Size: Normal)   Pulse 71   Temp 99 F (37.2 C) (Oral)   Ht 5\' 1"  (1.549 m)   Wt 177 lb (80.3 kg)   SpO2 98%   BMI 33.44 kg/m   BP Readings from Last 3 Encounters:  07/04/23 (!) 156/96  06/05/23 (!) 167/93  05/10/23 126/78   CC: hosp f/u visit  Subjective:   HPI: Kristen [name] is a 83 y.o. female presenting on 07/04/2023 for Hospitalization Follow-up (Pt admitted on 07/01/23 at [facility], dx generalized weakness; flu A; RSV; AKI; hyperglycemia w/ type 2 DM; HTN; aortic atherosclerosis. C/o ongoing cough. )   Delayed TCM due to increment weather.   Recent hospitalization last month for cough, congestion, and dyspnea/wheeze - diagnosed with influenza and RSV bronchitis as well as acute kidney injury with Cr deteriorated to 1.9, on discharge back to Cr 1.1. Treated with levaquin antibiotic, to complete outpatient oral course.  She had elevated troponin to 30s - but remained asymptomatic from cardiac standpoint Hospital records reviewed. Med rec performed.  Acute worsening of sugar control, managed with insulin drip inpatient, discharged back on basaglar.  Amlodipine dose increased to 10mg  daily, hydrochlorothiazide stopped. Continued rest of previous home regimen (hydralazine losartan and toprol XL).   She brings box of prepackaged meds that is incomplete - only with atorvastatin, plavix, amlodipine, hydralazine - need to verify what meds she's taking at home.   Over the past few weeks notes headache, malaise, nausea, cough productive of colored mucous, low grade fever.  Manages with tylenol.  No abd pain, diarrhea, nausea/vomiting.  Sugars have ben running high - notes poor appetite.   She states she will need [facility] application updated.   Home health not set up.  Other  follow up appointments scheduled: none ______________________________________________________________________ Hospital admission: 06/03/2023 Hospital discharge: 06/05/2023 TCM f/u phone call:  performed no 06/06/2023  Recommendations at discharge:  PCP follow up in 1 week. Repeat BMP in 1 week.   Discharge Diagnoses: Principal Problem:   AKI (acute kidney injury) (HCC) Active Problems:   Uncontrolled type 2 diabetes mellitus with hyperglycemia (HCC)   Essential hypertension   Influenza A   Dyslipidemia   Coronary artery disease     Relevant past medical, surgical, family and social history reviewed and updated as indicated. Interim medical history since our last visit reviewed. Allergies and medications reviewed and updated. Outpatient Medications Prior to Visit  Medication Sig Dispense Refill   atorvastatin (LIPITOR) 40 MG tablet Take 1 tablet (40 mg total) by mouth daily. 30 tablet 1   Blood Glucose Monitoring Suppl (ONETOUCH VERIO) w/Device KIT Use to check blood sugar up to 4 times a day as directed 1 kit 0   Cholecalciferol (VITAMIN D) 2000 units CAPS Take 2,000 Units by mouth daily.     clopidogrel (PLAVIX) 75 MG tablet Take 1 tablet (75 mg total) by mouth every morning. 30 tablet 1   colchicine 0.6 MG tablet Take 1 tablet (0.6 mg total) by mouth daily as needed for gout. 30 tablet 10   glucose blood (ONETOUCH VERIO) test strip USE TO TEST BLOOD SUGAR 4 TIMES DAILY AS DIRECTED 100 each 10   hydrALAZINE (APRESOLINE) 50 MG tablet Take 1 tablet (50 mg total) by mouth at  breakfast and at bedtime. 60 tablet 11   Insulin Glargine (BASAGLAR KWIKPEN) 100 UNIT/ML DIAL AND INJECT 18 UNITS UNDER THE SKIN AT BEDTIME 30 mL 2   Insulin Pen Needle (COMFORT EZ PEN NEEDLES) 31G X 5 MM MISC Use as instructed to inject insulin daily 100 each 3   Insulin Pen Needle (COMFORT EZ PEN NEEDLES) 31G X 5 MM MISC Use as directed to inject insulin daily 100 each 3   Lancets (ONETOUCH DELICA PLUS LANCET33G)  MISC Use as instructed to check blood sugar once a day 100 each 3   Miconazole 2 % POWD Apply 1 Application topically 2 (two) times daily as needed (fungal skin infection). 43 g 1   nitroGLYCERIN (NITROSTAT) 0.4 MG SL tablet Place 0.4 mg under the tongue every 5 (five) minutes as needed for chest pain.     OneTouch Delica Lancets 33G MISC Use as directed to test blood sugar once daily. 100 each 1   amLODipine (NORVASC) 10 MG tablet Take 1 tablet (10 mg total) by mouth daily. 30 tablet 1   famotidine (PEPCID) 20 MG tablet Take 1 tablet (20 mg total) by mouth at bedtime.     losartan (COZAAR) 100 MG tablet Take 1 tablet (100 mg total) by mouth every morning. 30 tablet 1   metoprolol succinate (TOPROL-XL) 25 MG 24 hr tablet Take 0.5 tablets (12.5 mg total) by mouth daily. 15 tablet 1   potassium chloride (KLOR-CON) 10 MEQ tablet Take 1 tablet (10 mEq total) by mouth every Monday, Wednesday, and Friday. 30 tablet 0   No facility-administered medications prior to visit.     Per HPI unless specifically indicated in ROS section below Review of Systems  Objective:  BP (!) 156/96 (BP Location: Right Arm, Cuff Size: Normal)   Pulse 71   Temp 99 F (37.2 C) (Oral)   Ht 5\' 1"  (1.549 m)   Wt 177 lb (80.3 kg)   SpO2 98%   BMI 33.44 kg/m   Wt Readings from Last 3 Encounters:  07/04/23 177 lb (80.3 kg)  06/03/23 182 lb 12.2 oz (82.9 kg)  05/10/23 182 lb 12.8 oz (82.9 kg)      Physical Exam Vitals and nursing note reviewed.  Constitutional:      Appearance: Normal appearance. She is not ill-appearing.  HENT:     Head: Normocephalic and atraumatic.     Mouth/Throat:     Mouth: Mucous membranes are moist.     Pharynx: Oropharynx is clear. No oropharyngeal exudate or posterior oropharyngeal erythema.  Eyes:     Extraocular Movements: Extraocular movements intact.     Pupils: Pupils are equal, round, and reactive to light.  Cardiovascular:     Rate and Rhythm: Normal rate and regular rhythm.      Pulses: Normal pulses.     Heart sounds: Normal heart sounds. No murmur heard. Pulmonary:     Effort: Pulmonary effort is normal. No respiratory distress.     Breath sounds: Normal breath sounds. No wheezing, rhonchi or rales.  Musculoskeletal:     Right lower leg: No edema.     Left lower leg: No edema.  Skin:    General: Skin is warm and dry.     Findings: No rash.  Neurological:     Mental Status: She is alert.       Lab Results  Component Value Date   NA 133 (L) 07/04/2023   CL 96 07/04/2023   K 4.3 07/04/2023   CO2  30 07/04/2023   BUN 25 (H) 07/04/2023   CREATININE 1.38 (H) 07/04/2023   GFRNONAA 49 (L) 06/04/2023   CALCIUM 9.1 07/04/2023   PHOS 3.3 07/04/2023   ALBUMIN 3.6 07/04/2023   GLUCOSE 510 (HH) 07/04/2023    Lab Results  Component Value Date   HGBA1C 13.5 (H) 07/04/2023    Lab Results  Component Value Date   TSH 4.48 06/07/2022     Assessment & Plan:   Problem List Items Addressed This Visit     Resistant hypertension   Chronic, BP elevated in office, however with confusion over home meds using pill packs.  I will ask [facility] pharmacy team to come out to house for medication management /clarification.       Relevant Medications   amLODipine (NORVASC) 10 MG tablet   losartan (COZAAR) 100 MG tablet   metoprolol succinate (TOPROL-XL) 25 MG 24 hr tablet   Other Relevant Orders   AMB Referral [facility] Care Management   Type 2 diabetes mellitus with hyperglycemia (HCC)   Chronic, only on basaglar 22u daily - update A1c.  H/o metformin intolerance. A1c markedly elevated - will recommend slow titration.  I will ask for pharmacy team to assist renewing basaglar application.       Relevant Medications   losartan (COZAAR) 100 MG tablet   Other Relevant Orders   Hemoglobin A1c (Completed)   AMB Referral [facility] Care Management   Polypharmacy   Relevant Orders   AMB Referral [facility] Care Management   Chronic kidney disease, stage 3a (HCC)   Update renal  function today.       Relevant Orders   Renal function panel (Completed)   AMB Referral [facility] Care Management   Bronchitis - Primary   Recent hospitalization for influenza/RSV bronchitis, discharged to complete levaquin course  Overall improved without ongoing fever or productive cough.         Meds ordered this encounter  Medications   amLODipine (NORVASC) 10 MG tablet    Sig: Take 1 tablet (10 mg total) by mouth daily.    Dispense:  90 tablet    Refill:  3   losartan (COZAAR) 100 MG tablet    Sig: Take 1 tablet (100 mg total) by mouth every morning.    Dispense:  90 tablet    Refill:  3   metoprolol succinate (TOPROL-XL) 25 MG 24 hr tablet    Sig: Take 0.5 tablets (12.5 mg total) by mouth daily.    Dispense:  45 tablet    Refill:  3   potassium chloride (KLOR-CON) 10 MEQ tablet    Sig: Take 1 tablet (10 mEq total) by mouth every Monday, Wednesday, and Friday.    Dispense:  70 tablet    Refill:  3    Orders Placed This Encounter  Procedures   Renal function panel   Hemoglobin A1c   AMB Referral [facility] Care Management    Referral Priority:   Routine    Referral Type:   Consultation    Referral Reason:   Care Coordination    Number of Visits Requested:   1    Patient Instructions  I will ask someone to reach out to you to help with your medicines.  Continue medicines as per your medicine list printed today  Labs today.  Keep appointment at end of March.   Follow up plan: Return if symptoms worsen or fail to improve.  Eustaquio Boyden, MD

## 2023-07-04 NOTE — Patient Instructions (Addendum)
 I will ask someone to reach out to you to help with your medicines.  Continue medicines as per your medicine list printed today  Labs today.  Keep appointment at end of March.

## 2023-07-05 ENCOUNTER — Encounter: Payer: Self-pay | Admitting: Family Medicine

## 2023-07-05 NOTE — Progress Notes (Incomplete)
 Ph: [phone] Fax: [phone]   Patient ID: Kristen [name], female    DOB: Feb 20, 1941, 83 y.o.   MRN: [id number]  This visit was conducted in person.  BP (!) 156/96 (BP Location: Right Arm, Cuff Size: Normal)   Pulse 71   Temp 99 F (37.2 C) (Oral)   Ht 5\' 1"  (1.549 m)   Wt 177 lb (80.3 kg)   SpO2 98%   BMI 33.44 kg/m   BP Readings from Last 3 Encounters:  07/04/23 (!) 156/96  06/05/23 (!) 167/93  05/10/23 126/78   CC: hosp f/u visit  Subjective:   HPI: Kristen [name] is a 83 y.o. female presenting on 07/04/2023 for Hospitalization Follow-up (Pt admitted on 07/01/23 at [facility], dx generalized weakness; flu A; RSV; AKI; hyperglycemia w/ type 2 DM; HTN; aortic atherosclerosis. C/o ongoing cough. )   Delayed TCM due to increment weather.   Recent hospitalization last month for cough, congestion, and dyspnea/wheeze - diagnosed with influenza and RSV bronchitis as well as acute kidney injury with Cr deteriorated to 1.9, on discharge back to Cr 1.1. Treated with levaquin antibiotic, to complete outpatient oral course.  She had elevated troponin to 30s - but remained asymptomatic from cardiac standpoint Hospital records reviewed. Med rec performed.  Acute worsening of sugar control, managed with insulin drip inpatient, discharged back on 18u basaglar.  Amlodipine dose increased to 10mg  daily, hydrochlorothiazide stopped. Continued rest of previous home regimen (hydralazine losartan and toprol XL).   She brings box of prepackaged meds that is incomplete - only with atorvastatin, plavix, .   Over the past few weeks notes headache, malaise, nausea, cough productive of colored mucous, low grade fever.  Manages with tylenol.  No abd pain, diarrhea, nausea/vomiting.  Sugars have ben running high - notes poor appetite.   She states she will need [facility] application updated.   Home health not set up.  Other follow up appointments scheduled:  none ______________________________________________________________________ Hospital admission: 06/03/2023 Hospital discharge: 06/05/2023 TCM f/u phone call:  performed no 06/06/2023  Recommendations at discharge:  PCP follow up in 1 week. Repeat BMP in 1 week.   Discharge Diagnoses: Principal Problem:   AKI (acute kidney injury) (HCC) Active Problems:   Uncontrolled type 2 diabetes mellitus with hyperglycemia (HCC)   Essential hypertension   Influenza A   Dyslipidemia   Coronary artery disease     Relevant past medical, surgical, family and social history reviewed and updated as indicated. Interim medical history since our last visit reviewed. Allergies and medications reviewed and updated. Outpatient Medications Prior to Visit  Medication Sig Dispense Refill  . atorvastatin (LIPITOR) 40 MG tablet Take 1 tablet (40 mg total) by mouth daily. 30 tablet 1  . Blood Glucose Monitoring Suppl (ONETOUCH VERIO) w/Device KIT Use to check blood sugar up to 4 times a day as directed 1 kit 0  . Cholecalciferol (VITAMIN D) 2000 units CAPS Take 2,000 Units by mouth daily.    . clopidogrel (PLAVIX) 75 MG tablet Take 1 tablet (75 mg total) by mouth every morning. 30 tablet 1  . colchicine 0.6 MG tablet Take 1 tablet (0.6 mg total) by mouth daily as needed for gout. 30 tablet 10  . glucose blood (ONETOUCH VERIO) test strip USE TO TEST BLOOD SUGAR 4 TIMES DAILY AS DIRECTED 100 each 10  . hydrALAZINE (APRESOLINE) 50 MG tablet Take 1 tablet (50 mg total) by mouth at breakfast and at bedtime. 60 tablet 11  . Insulin  Glargine (BASAGLAR KWIKPEN) 100 UNIT/ML DIAL AND INJECT 18 UNITS UNDER THE SKIN AT BEDTIME 30 mL 2  . Insulin Pen Needle (COMFORT EZ PEN NEEDLES) 31G X 5 MM MISC Use as instructed to inject insulin daily 100 each 3  . Insulin Pen Needle (COMFORT EZ PEN NEEDLES) 31G X 5 MM MISC Use as directed to inject insulin daily 100 each 3  . Lancets (ONETOUCH DELICA PLUS LANCET33G) MISC Use as instructed  to check blood sugar once a day 100 each 3  . Miconazole 2 % POWD Apply 1 Application topically 2 (two) times daily as needed (fungal skin infection). 43 g 1  . nitroGLYCERIN (NITROSTAT) 0.4 MG SL tablet Place 0.4 mg under the tongue every 5 (five) minutes as needed for chest pain.    Letta Pate Delica Lancets 33G MISC Use as directed to test blood sugar once daily. 100 each 1  . amLODipine (NORVASC) 10 MG tablet Take 1 tablet (10 mg total) by mouth daily. 30 tablet 1  . famotidine (PEPCID) 20 MG tablet Take 1 tablet (20 mg total) by mouth at bedtime.    Marland Kitchen losartan (COZAAR) 100 MG tablet Take 1 tablet (100 mg total) by mouth every morning. 30 tablet 1  . metoprolol succinate (TOPROL-XL) 25 MG 24 hr tablet Take 0.5 tablets (12.5 mg total) by mouth daily. 15 tablet 1  . potassium chloride (KLOR-CON) 10 MEQ tablet Take 1 tablet (10 mEq total) by mouth every Monday, Wednesday, and Friday. 30 tablet 0   No facility-administered medications prior to visit.     Per HPI unless specifically indicated in ROS section below Review of Systems  Objective:  BP (!) 156/96 (BP Location: Right Arm, Cuff Size: Normal)   Pulse 71   Temp 99 F (37.2 C) (Oral)   Ht 5\' 1"  (1.549 m)   Wt 177 lb (80.3 kg)   SpO2 98%   BMI 33.44 kg/m   Wt Readings from Last 3 Encounters:  07/04/23 177 lb (80.3 kg)  06/03/23 182 lb 12.2 oz (82.9 kg)  05/10/23 182 lb 12.8 oz (82.9 kg)      Physical Exam Vitals and nursing note reviewed.  Constitutional:      Appearance: Normal appearance. She is not ill-appearing.  HENT:     Head: Normocephalic and atraumatic.     Mouth/Throat:     Mouth: Mucous membranes are moist.     Pharynx: Oropharynx is clear. No oropharyngeal exudate or posterior oropharyngeal erythema.  Eyes:     Extraocular Movements: Extraocular movements intact.     Pupils: Pupils are equal, round, and reactive to light.  Cardiovascular:     Rate and Rhythm: Normal rate and regular rhythm.     Pulses:  Normal pulses.     Heart sounds: Normal heart sounds. No murmur heard. Pulmonary:     Effort: Pulmonary effort is normal. No respiratory distress.     Breath sounds: Normal breath sounds. No wheezing, rhonchi or rales.  Musculoskeletal:     Right lower leg: No edema.     Left lower leg: No edema.  Skin:    General: Skin is warm and dry.     Findings: No rash.  Neurological:     Mental Status: She is alert.       Lab Results  Component Value Date   NA 133 (L) 07/04/2023   CL 96 07/04/2023   K 4.3 07/04/2023   CO2 30 07/04/2023   BUN 25 (H) 07/04/2023  CREATININE 1.38 (H) 07/04/2023   GFRNONAA 49 (L) 06/04/2023   CALCIUM 9.1 07/04/2023   PHOS 3.3 07/04/2023   ALBUMIN 3.6 07/04/2023   GLUCOSE 510 (HH) 07/04/2023    Lab Results  Component Value Date   HGBA1C 13.5 (H) 07/04/2023    Lab Results  Component Value Date   TSH 4.48 06/07/2022     Assessment & Plan:   Problem List Items Addressed This Visit     Resistant hypertension   Relevant Medications   amLODipine (NORVASC) 10 MG tablet   losartan (COZAAR) 100 MG tablet   metoprolol succinate (TOPROL-XL) 25 MG 24 hr tablet   Type 2 diabetes mellitus with hyperglycemia (HCC)   Relevant Medications   losartan (COZAAR) 100 MG tablet   Other Relevant Orders   Hemoglobin A1c (Completed)   Chronic kidney disease, stage 3a (HCC) - Primary   Relevant Orders   Renal function panel (Completed)     Meds ordered this encounter  Medications  . amLODipine (NORVASC) 10 MG tablet    Sig: Take 1 tablet (10 mg total) by mouth daily.    Dispense:  90 tablet    Refill:  3  . losartan (COZAAR) 100 MG tablet    Sig: Take 1 tablet (100 mg total) by mouth every morning.    Dispense:  90 tablet    Refill:  3  . metoprolol succinate (TOPROL-XL) 25 MG 24 hr tablet    Sig: Take 0.5 tablets (12.5 mg total) by mouth daily.    Dispense:  45 tablet    Refill:  3  . potassium chloride (KLOR-CON) 10 MEQ tablet    Sig: Take 1 tablet  (10 mEq total) by mouth every Monday, Wednesday, and Friday.    Dispense:  70 tablet    Refill:  3    Orders Placed This Encounter  Procedures  . Renal function panel  . Hemoglobin A1c    Patient Instructions  I will ask someone to reach out to you to help with your medicines.  Continue medicines as per your medicine list printed today  Labs today.  Keep appointment at end of March.   Follow up plan: Return if symptoms worsen or fail to improve.  Eustaquio Boyden, MD

## 2023-07-06 ENCOUNTER — Telehealth: Payer: Self-pay | Admitting: *Deleted

## 2023-07-06 NOTE — Progress Notes (Signed)
 Care Guide Pharmacy Note  07/06/2023 Name: Kristen [name] MRN: [id number] DOB: [birth year]  Referred By: Eustaquio Boyden, MD Reason for referral: No chief complaint on file.   Kristen [name] is a 83 y.o. year old female who is a primary care patient of Eustaquio Boyden, MD.  Kristen [name] was referred to the pharmacist for assistance related to: HTN, DMII, CKD Stage 3a, and polypharmacy   Successful contact was made with the patient to discuss pharmacy services including being ready for the pharmacist to call at least 5 minutes before the scheduled appointment time and to have medication bottles and any blood pressure readings ready for review. The patient agreed to meet with the pharmacist via telephone visit on (date/time).3/20 at 10:00 AM   Kristen [name]  [facility] Guide  Direct Dial: [phone]  Fax [phone]

## 2023-07-06 NOTE — Assessment & Plan Note (Signed)
Update renal function today.

## 2023-07-06 NOTE — Telephone Encounter (Signed)
 Late entry - discussed via lab result section.

## 2023-07-06 NOTE — Assessment & Plan Note (Addendum)
 Chronic, only on basaglar 22u daily - update A1c.  H/o metformin intolerance. A1c markedly elevated - will recommend slow titration.  I will ask for pharmacy team to assist renewing basaglar application.

## 2023-07-06 NOTE — Assessment & Plan Note (Addendum)
 Recent hospitalization for influenza/RSV bronchitis, discharged to complete levaquin course  Overall improved without ongoing fever or productive cough.

## 2023-07-06 NOTE — Assessment & Plan Note (Addendum)
 Chronic, BP elevated in office, however with confusion over home meds using pill packs.  I will ask [facility] pharmacy team to come out to house for medication management /clarification.

## 2023-07-10 ENCOUNTER — Other Ambulatory Visit: Payer: Self-pay | Admitting: Family Medicine

## 2023-07-11 ENCOUNTER — Other Ambulatory Visit: Payer: Self-pay

## 2023-07-12 ENCOUNTER — Other Ambulatory Visit (INDEPENDENT_AMBULATORY_CARE_PROVIDER_SITE_OTHER): Admitting: Pharmacist

## 2023-07-12 ENCOUNTER — Other Ambulatory Visit: Payer: Self-pay

## 2023-07-12 ENCOUNTER — Encounter: Payer: Self-pay | Admitting: Pharmacist

## 2023-07-12 ENCOUNTER — Other Ambulatory Visit (HOSPITAL_COMMUNITY): Payer: Self-pay

## 2023-07-12 DIAGNOSIS — E1159 Type 2 diabetes mellitus with other circulatory complications: Secondary | ICD-10-CM

## 2023-07-12 NOTE — Progress Notes (Signed)
 07/13/2023 Name: Kristen [name] MRN: [id number] DOB: [birth year]  Subjective  Chief Complaint  Patient presents with   Diabetes   Medication Reconcilliation    Reason for visit: Kristen [name] is a 83 y.o. year old female who presented for a telephone visit.   They were referred to the pharmacist by their PCP for assistance in managing complex medication management/medication assistance.   Care Team: Primary Care Provider: Eustaquio Boyden, MD  Reason for visit: ?  Kristen [name] is a 83 y.o. female who requests assistance with her medications (gets confused now with what pills are for what.  Medications are packaged differently than when she got them from [facility]. Some are in bottles which makes her nervous for fear of accidentally doubling her medicines.  Recent hospitalization with medication change has also affected her pill pack and she hopes to confirm that everything is reflective of these changes in her current packs.   Prescription drug coverage: Payor: [employer] / Plan: [employer] PPO / Product Type: *No Product type* / .  Current Pharmacy: [facility] (adherence packaging)      History of Present Illness:   Hospital Encounter Discharge 06/05/23 Stop hydrochlorothiazide, stop tizanidine  Start/increase amlodipine 10 mg daily   Medication Access/Adherence: Reports that all medications are affordable.  Current Patient Assistance: Counselling psychologist) Medication Adherence: Patient denies missing doses of their medication typically Previously received pill packs from [facility], now receiving packets through [facility].   A/P Patient has her medications with her at the time of the visit. We reviewed all medications present in patient's home.  Reviewed contents and number of medications/pack in her compliance packaging. Is able to read text on packets without concerns.  Patient confirmed contents of each pill packet:  Pill Packaging:  Takes in the  morning: Packet with 4 tablets (atorvastatin, clopidogrel, hydralazine, losartan) Takes in the evening/bedtime: 1 tablet packet (hydralazine 2nd BID dose)  Medications in Pill Bottles: amlodipine 10 mg tablet (due to recent dose change), metoprolol succinate (not packed due to 1/2 tablet instruction), potassium chloride (not packaged due to MWF dosing).  Medication table by disease state has been printed and mailed to patient (documented below)   Medications as of March 20th 2025  ? Medication name Instructions  Notes   Diabetes   Basaglar insulin Inject each night as instructed  This comes in the mail from the [facility] program  Blood Pressure  Metoprolol 25 mg tablet Half of a tablet once daily  This pill is in a bottle  ?(Tablet must be cut in half)  Amlodipine 10 mg tablet 1 tablet each morning  This is in a pill bottle since it was recently changed in the hospital  Next month, it should be back in your morning 5 pill packet  Hydralazine 50 mg tablet 1 tablet two times daily    In your morning 4 pill packet   and in your 1 pill bedtime packet  Losartan 100 mg tablet 1 tablet daily  In your morning 4 pill packet       Cholesterol and heart health  Atorvastatin 40 mg tablet 1 tablet daily  In your morning 4 pill packet  Clopidogrel 75 mg tablet 1 tablet daily  In your morning 4 pill packet  Other Medications   Potassium Chloride 10 mEq tablet 1 tablet Monday, Wednesday, Friday  This pill is in a bottle    Medications you take only as needed: (Not in your medicine packets) Colchicine (for  gout flares) Cyclobenzaprine (muscle relaxer), tizanidine (Zanaflex) (muscle relaxer) Miconazole powder (breast tissue irritation) Nitroglycerin (chest pain or pressure)  The medicines you purchase from the pharmacy: (Not in your medicine packets) Vitamin D3 (1 tablet daily) Famotidine (Pepcid) at night (for the stomach)   Follow Up Patient given direct line for questions/concerns.  Encouraged her to call if any concern/questions about her pill pack for the next month once delivered  Future Appointments  Date Time Provider Department Center  07/17/2023 11:00 AM Eustaquio Boyden, MD [facility]  07/18/2023  1:30 PM Homsher, Wynona Canes, RN [facility] None  06/20/2024 11:30 AM [facility] ANNUAL WELLNESS VISIT 1 [facility]   Loree Fee, PharmD Clinical Pharmacist [facility] [phone]

## 2023-07-12 NOTE — Progress Notes (Signed)
 Filled out online form. Thanks.  There was mention of attaching a signed prescription - I did not do this as I'm not currently in office.  Let me know if this needs to be done and I can sign printed Rx tomorrow in office.

## 2023-07-12 NOTE — Progress Notes (Addendum)
 Patient Assistance Program (PAP) Application   Manufacturer: Secretary/administrator    (Re-enrollment) Medication(s): Basaglar  Patient Portion of Application:  07/12/23: Completed with patient via online enrollment tool. Submitted.  Income Documentation: N/A - Electronic verification elected.  Provider Portion of Application:  07/12/23:  PharmD completed via online enrollment. Sent to PCP for review/e-signature Prescription(s): Electronically prescribed to [facility] pharmacy  Basaglar U100 kwikpen. 24 units sq once daily.    Application: [id number]  07/26/23: [employer] automated system states patient re-enrollment has not been approved due to missing patient and provider signatures (these were submitted online so this is incorrect).  Called [employer] to speak with representative: They report patient electronic signature did not come through.  Re-signed 07/26/23.   Next Steps: [x]    PCP review and e-signature   Forwarded to [facility] Patient Advocate Team for future correspondences/re-enrollment.   *[facility] clinic team - Please Addend/update this note as the "Next Steps" are completed in office*

## 2023-07-17 ENCOUNTER — Ambulatory Visit (INDEPENDENT_AMBULATORY_CARE_PROVIDER_SITE_OTHER): Payer: Medicare Other | Admitting: Family Medicine

## 2023-07-17 ENCOUNTER — Other Ambulatory Visit: Payer: Self-pay

## 2023-07-17 ENCOUNTER — Encounter: Payer: Self-pay | Admitting: Family Medicine

## 2023-07-17 VITALS — BP 166/78 | HR 53 | Temp 98.0°F | Ht 61.5 in | Wt 179.2 lb

## 2023-07-17 DIAGNOSIS — E1165 Type 2 diabetes mellitus with hyperglycemia: Secondary | ICD-10-CM

## 2023-07-17 DIAGNOSIS — I2510 Atherosclerotic heart disease of native coronary artery without angina pectoris: Secondary | ICD-10-CM

## 2023-07-17 DIAGNOSIS — Z7189 Other specified counseling: Secondary | ICD-10-CM

## 2023-07-17 DIAGNOSIS — E538 Deficiency of other specified B group vitamins: Secondary | ICD-10-CM | POA: Diagnosis not present

## 2023-07-17 DIAGNOSIS — I1A0 Resistant hypertension: Secondary | ICD-10-CM | POA: Diagnosis not present

## 2023-07-17 DIAGNOSIS — N1831 Chronic kidney disease, stage 3a: Secondary | ICD-10-CM

## 2023-07-17 DIAGNOSIS — M1A09X0 Idiopathic chronic gout, multiple sites, without tophus (tophi): Secondary | ICD-10-CM | POA: Diagnosis not present

## 2023-07-17 DIAGNOSIS — E785 Hyperlipidemia, unspecified: Secondary | ICD-10-CM | POA: Diagnosis not present

## 2023-07-17 DIAGNOSIS — I69351 Hemiplegia and hemiparesis following cerebral infarction affecting right dominant side: Secondary | ICD-10-CM

## 2023-07-17 DIAGNOSIS — Z79899 Other long term (current) drug therapy: Secondary | ICD-10-CM

## 2023-07-17 DIAGNOSIS — Z0000 Encounter for general adult medical examination without abnormal findings: Secondary | ICD-10-CM | POA: Diagnosis not present

## 2023-07-17 DIAGNOSIS — E559 Vitamin D deficiency, unspecified: Secondary | ICD-10-CM | POA: Diagnosis not present

## 2023-07-17 DIAGNOSIS — E66811 Obesity, class 1: Secondary | ICD-10-CM

## 2023-07-17 DIAGNOSIS — E113392 Type 2 diabetes mellitus with moderate nonproliferative diabetic retinopathy without macular edema, left eye: Secondary | ICD-10-CM

## 2023-07-17 DIAGNOSIS — Z794 Long term (current) use of insulin: Secondary | ICD-10-CM

## 2023-07-17 DIAGNOSIS — Z0001 Encounter for general adult medical examination with abnormal findings: Secondary | ICD-10-CM

## 2023-07-17 DIAGNOSIS — E1169 Type 2 diabetes mellitus with other specified complication: Secondary | ICD-10-CM

## 2023-07-17 DIAGNOSIS — D649 Anemia, unspecified: Secondary | ICD-10-CM | POA: Diagnosis not present

## 2023-07-17 DIAGNOSIS — E1159 Type 2 diabetes mellitus with other circulatory complications: Secondary | ICD-10-CM

## 2023-07-17 DIAGNOSIS — J40 Bronchitis, not specified as acute or chronic: Secondary | ICD-10-CM

## 2023-07-17 LAB — LIPID PANEL
Cholesterol: 162 mg/dL (ref 0–200)
HDL: 47.7 mg/dL (ref >39.00)
LDL Cholesterol: 88 mg/dL (ref 0–99)
NonHDL: 113.92
Total CHOL/HDL Ratio: 3
Triglycerides: 131 mg/dL (ref 0.0–149.0)
VLDL: 26.2 mg/dL (ref 0.0–40.0)

## 2023-07-17 LAB — COMPREHENSIVE METABOLIC PANEL
ALT: 13 U/L (ref 0–35)
AST: 14 U/L (ref 0–37)
Albumin: 3.8 g/dL (ref 3.5–5.2)
Alkaline Phosphatase: 99 U/L (ref 39–117)
BUN: 21 mg/dL (ref 6–23)
CO2: 29 meq/L (ref 19–32)
Calcium: 9.4 mg/dL (ref 8.4–10.5)
Chloride: 100 meq/L (ref 96–112)
Creatinine, Ser: 0.96 mg/dL (ref 0.40–1.20)
GFR: 55.32 mL/min — ABNORMAL LOW (ref >60.00)
Glucose, Bld: 306 mg/dL — ABNORMAL HIGH (ref 70–99)
Potassium: 4.6 meq/L (ref 3.5–5.1)
Sodium: 136 meq/L (ref 135–145)
Total Bilirubin: 0.7 mg/dL (ref 0.2–1.2)
Total Protein: 6.9 g/dL (ref 6.0–8.3)

## 2023-07-17 LAB — CBC WITH DIFFERENTIAL/PLATELET
Basophils Absolute: 0.1 10*3/uL (ref 0.0–0.1)
Basophils Relative: 0.7 % (ref 0.0–3.0)
Eosinophils Absolute: 0 10*3/uL (ref 0.0–0.7)
Eosinophils Relative: 0.5 % (ref 0.0–5.0)
HCT: 35.4 % — ABNORMAL LOW (ref 36.0–46.0)
Hemoglobin: 11.4 g/dL — ABNORMAL LOW (ref 12.0–15.0)
Lymphocytes Relative: 28.8 % (ref 12.0–46.0)
Lymphs Abs: 2.4 10*3/uL (ref 0.7–4.0)
MCHC: 32.1 g/dL (ref 30.0–36.0)
MCV: 86.2 fl (ref 78.0–100.0)
Monocytes Absolute: 0.5 10*3/uL (ref 0.1–1.0)
Monocytes Relative: 5.5 % (ref 3.0–12.0)
Neutro Abs: 5.3 10*3/uL (ref 1.4–7.7)
Neutrophils Relative %: 64.5 % (ref 43.0–77.0)
Platelets: 292 10*3/uL (ref 150.0–400.0)
RBC: 4.11 Mil/uL (ref 3.87–5.11)
RDW: 16.1 % — ABNORMAL HIGH (ref 11.5–15.5)
WBC: 8.3 10*3/uL (ref 4.0–10.5)

## 2023-07-17 LAB — MICROALBUMIN / CREATININE URINE RATIO
Creatinine,U: 98.6 mg/dL
Microalb Creat Ratio: 482.5 mg/g — ABNORMAL HIGH (ref 0.0–30.0)
Microalb, Ur: 47.6 mg/dL — ABNORMAL HIGH (ref 0.0–1.9)

## 2023-07-17 LAB — URIC ACID: Uric Acid, Serum: 5.4 mg/dL (ref 2.4–7.0)

## 2023-07-17 LAB — VITAMIN B12: Vitamin B-12: 599 pg/mL (ref 211–911)

## 2023-07-17 LAB — PHOSPHORUS: Phosphorus: 3.2 mg/dL (ref 2.3–4.6)

## 2023-07-17 LAB — VITAMIN D 25 HYDROXY (VIT D DEFICIENCY, FRACTURES): VITD: 47.6 ng/mL (ref 30.00–100.00)

## 2023-07-17 MED ORDER — BASAGLAR KWIKPEN 100 UNIT/ML ~~LOC~~ SOPN
22.0000 [IU] | PEN_INJECTOR | Freq: Every day | SUBCUTANEOUS | 1 refills | Status: DC
Start: 2023-07-17 — End: 2023-09-12
  Filled 2023-07-17: qty 6, 27d supply, fill #0

## 2023-07-17 NOTE — Assessment & Plan Note (Addendum)
 Previously discussed.

## 2023-07-17 NOTE — Patient Instructions (Addendum)
 Labs today  Continue Basaglar 22 units nightly.  Schedule diabetic eye exam with Dr Dione Booze in June 2025.  Good to see you today Return in 4 months for diabetes follow up visit

## 2023-07-17 NOTE — Progress Notes (Unsigned)
 Ph: [phone] Fax: [phone]   Patient ID: Kristen [name], female    DOB: Feb 19, 1941, 83 y.o.   MRN: [id number]  This visit was conducted in person.  BP (!) 166/78   Pulse (!) 53   Temp 98 F (36.7 C) (Oral)   Ht 5' 1.5" (1.562 m)   Wt 179 lb 4 oz (81.3 kg)   SpO2 98%   BMI 33.32 kg/m   BP Readings from Last 3 Encounters:  07/17/23 (!) 166/78  07/04/23 (!) 156/96  06/05/23 (!) 167/93  Elevated on repeat check  CC: CPE Subjective:   HPI: Kristen [name] is a 83 y.o. female presenting on 07/17/2023 for Annual Exam (MCR prt 2 [AWV- 05/10/23].)   Saw health advisor 04/2023 for medicare wellness visit. Note reviewed.  No results found.  Flowsheet Row Office Visit from 07/04/2023 in [facility] [place]  PHQ-2 Total Score 0          05/10/2023   11:26 AM 12/19/2022    9:59 AM 06/07/2022   10:38 AM 04/13/2022    1:16 PM 04/05/2021   12:37 PM  Fall Risk   Falls in the past year? 0 0 0 0 0  Number falls in past yr: 0 0  0 0  Injury with Fall? 0 0   0  Risk for fall due to : No Fall Risks No Fall Risks   No Fall Risks  Follow up Education provided;Falls prevention discussed Falls evaluation completed  Falls evaluation completed Falls prevention discussed      DM - on basaglar 22u in evenings. Notes morning readings 110-120, evening readings 200s. No sugars >300 in the past week  Saw our pharmacy team Lillia Abed with clarification on medication regimen.   Preventative: Colonoscopy 07/2020 done for positive iFOB - TA and TVA (large) consider rpt 3 yrs ([facility]) - she declines repeat, no noted blood in stool.  EGD 07/2020 - H pylori positive (treated), dilation of upper esoph stricture Well woman - s/p hysterectomy for fibroids, ovaries remain.  [facility] - requests to stop screening. Discussed home self breast exams.  DEXA WNL 03/2015  Lung cancer screening - not eligible  Flu shot - declines COVID  vaccine - 06/2020 x2, no booster Prevnar-13 08/2013, pneumovax 2016  Zostavax - 03/2014  Shingrix - 06/2021, completed at [facility] will bring me date Advanced directive: scanned 11/20219 - sons Alden Server and Minerva Areola are HCPOA. Does not want prolonged life support if terminal condition. MOST form reviewed and filled out 10/2017 as well.  Seat belt use discussed  Sunscreen use discussed. No changing moles on skin.  Non smoker  Alcohol - rare  Dentist - has dentures Eye exam - yearly - [place] 09/2022. She lives in [place] but declines referral to [facility] [facility] and [facility]  Bowel - no constipation  Bladder - no incontinence   Lives alone   4 grown children  Education: 11th grade Occupation: retired, was Higher education careers adviser   Activity: no regular walking.  Diet: good water, fruits/vegetables seldom      Relevant past medical, surgical, family and social history reviewed and updated as indicated. Interim medical history since our last visit reviewed. Allergies and medications reviewed and updated. Outpatient Medications Prior to Visit  Medication Sig Dispense Refill   amLODipine (NORVASC) 10 MG tablet Take 1 tablet (10 mg total) by mouth daily. 90 tablet 3   atorvastatin (LIPITOR) 40 MG tablet  Take 1 tablet (40 mg total) by mouth daily. 30 tablet 1   Blood Glucose Monitoring Suppl (ONETOUCH VERIO) w/Device KIT Use to check blood sugar up to 4 times a day as directed 1 kit 0   Cholecalciferol (VITAMIN D) 2000 units CAPS Take 2,000 Units by mouth daily.     clopidogrel (PLAVIX) 75 MG tablet Take 1 tablet (75 mg total) by mouth every morning. 30 tablet 1   colchicine 0.6 MG tablet Take 1 tablet (0.6 mg total) by mouth daily as needed for gout. 30 tablet 10   glucose blood (ONETOUCH VERIO) test strip USE TO TEST BLOOD SUGAR 4 TIMES DAILY AS DIRECTED 100 each 10   hydrALAZINE (APRESOLINE) 50 MG tablet Take 1 tablet (50 mg total) by mouth at breakfast and at bedtime. 60 tablet 11   Insulin Glargine  (BASAGLAR KWIKPEN) 100 UNIT/ML Inject 24 Units into the skin daily.     Insulin Pen Needle (COMFORT EZ PEN NEEDLES) 31G X 5 MM MISC Use as instructed to inject insulin daily 100 each 3   Insulin Pen Needle (COMFORT EZ PEN NEEDLES) 31G X 5 MM MISC Use as directed to inject insulin daily 100 each 3   Lancets (ONETOUCH DELICA PLUS LANCET33G) MISC Use as instructed to check blood sugar once a day 100 each 3   losartan (COZAAR) 100 MG tablet Take 1 tablet (100 mg total) by mouth every morning. 90 tablet 3   metoprolol succinate (TOPROL-XL) 25 MG 24 hr tablet Take 0.5 tablets (12.5 mg total) by mouth daily. 45 tablet 3   Miconazole 2 % POWD Apply 1 Application topically 2 (two) times daily as needed (fungal skin infection). 43 g 1   nitroGLYCERIN (NITROSTAT) 0.4 MG SL tablet Place 0.4 mg under the tongue every 5 (five) minutes as needed for chest pain.     OneTouch Delica Lancets 33G MISC Use as directed to test blood sugar once daily. 100 each 1   potassium chloride (KLOR-CON) 10 MEQ tablet Take 1 tablet (10 mEq total) by mouth every Monday, Wednesday, and Friday. 70 tablet 3   No facility-administered medications prior to visit.     Per HPI unless specifically indicated in ROS section below Review of Systems  Constitutional:  Negative for activity change, appetite change, chills, fatigue, fever and unexpected weight change.  HENT:  Negative for hearing loss.   Eyes:  Negative for visual disturbance.  Respiratory:  Positive for cough. Negative for chest tightness, shortness of breath and wheezing.   Cardiovascular:  Negative for chest pain, palpitations and leg swelling.  Gastrointestinal:  Negative for abdominal distention, abdominal pain, blood in stool, constipation, diarrhea, nausea and vomiting.  Genitourinary:  Negative for difficulty urinating and hematuria.  Musculoskeletal:  Negative for arthralgias, myalgias and neck pain.  Skin:  Negative for rash.  Neurological:  Positive for  headaches. Negative for dizziness, seizures and syncope.  Hematological:  Negative for adenopathy. Does not bruise/bleed easily.  Psychiatric/Behavioral:  Negative for dysphoric mood. The patient is not nervous/anxious.     Objective:  BP (!) 166/78   Pulse (!) 53   Temp 98 F (36.7 C) (Oral)   Ht 5' 1.5" (1.562 m)   Wt 179 lb 4 oz (81.3 kg)   SpO2 98%   BMI 33.32 kg/m   Wt Readings from Last 3 Encounters:  07/17/23 179 lb 4 oz (81.3 kg)  07/04/23 177 lb (80.3 kg)  06/03/23 182 lb 12.2 oz (82.9 kg)  Physical Exam Vitals and nursing note reviewed.  Constitutional:      Appearance: Normal appearance. She is not ill-appearing.  HENT:     Head: Normocephalic and atraumatic.     Right Ear: Tympanic membrane, ear canal and external ear normal. There is no impacted cerumen.     Left Ear: Tympanic membrane, ear canal and external ear normal. There is no impacted cerumen.     Mouth/Throat:     Mouth: Mucous membranes are moist.     Pharynx: Oropharynx is clear. No oropharyngeal exudate or posterior oropharyngeal erythema.  Eyes:     General:        Right eye: No discharge.        Left eye: No discharge.     Extraocular Movements: Extraocular movements intact.     Conjunctiva/sclera: Conjunctivae normal.     Pupils: Pupils are equal, round, and reactive to light.  Neck:     Thyroid: No thyroid mass or thyromegaly.  Cardiovascular:     Rate and Rhythm: Normal rate and regular rhythm.     Pulses: Normal pulses.     Heart sounds: Normal heart sounds. No murmur heard. Pulmonary:     Effort: Pulmonary effort is normal. No respiratory distress.     Breath sounds: Normal breath sounds. No wheezing, rhonchi or rales.  Abdominal:     General: Bowel sounds are normal. There is no distension.     Palpations: Abdomen is soft. There is no mass.     Tenderness: There is no abdominal tenderness. There is no guarding or rebound.     Hernia: No hernia is present.  Musculoskeletal:      Cervical back: Normal range of motion and neck supple. No rigidity.     Right lower leg: No edema.     Left lower leg: No edema.  Lymphadenopathy:     Cervical: No cervical adenopathy.  Skin:    General: Skin is warm and dry.     Findings: No rash.  Neurological:     General: No focal deficit present.     Mental Status: She is alert. Mental status is at baseline.  Psychiatric:        Mood and Affect: Mood normal.        Behavior: Behavior normal.       Results for orders placed or performed in visit on 07/04/23  Renal function panel   Collection Time: 07/04/23 12:39 PM  Result Value Ref Range   Sodium 133 (L) 135 - 145 mEq/L   Potassium 4.3 3.5 - 5.1 mEq/L   Chloride 96 96 - 112 mEq/L   CO2 30 19 - 32 mEq/L   Albumin 3.6 3.5 - 5.2 g/dL   BUN 25 (H) 6 - 23 mg/dL   Creatinine, Ser 1.61 (H) 0.40 - 1.20 mg/dL   Glucose, Bld 096 (HH) 70 - 99 mg/dL   Phosphorus 3.3 2.3 - 4.6 mg/dL   GFR 04.54 (L) >09.81 mL/min   Calcium 9.1 8.4 - 10.5 mg/dL  Hemoglobin X9J   Collection Time: 07/04/23 12:39 PM  Result Value Ref Range   Hgb A1c MFr Bld 13.5 (H) 4.6 - 6.5 %   Lab Results  Component Value Date   CHOL 146 06/07/2022   HDL 42.40 06/07/2022   LDLCALC 68 06/07/2022   LDLDIRECT 102.0 12/31/2015   TRIG 177.0 (H) 06/07/2022   CHOLHDL 3 06/07/2022   Assessment & Plan:   Problem List Items Addressed This Visit   None  No orders of the defined types were placed in this encounter.   No orders of the defined types were placed in this encounter.   There are no Patient Instructions on file for this visit.  Follow up plan: No follow-ups on file.  Eustaquio Boyden, MD

## 2023-07-17 NOTE — Assessment & Plan Note (Signed)
 Preventative protocols reviewed and updated unless pt declined. Discussed healthy diet and lifestyle.

## 2023-07-18 ENCOUNTER — Other Ambulatory Visit: Payer: Self-pay

## 2023-07-18 NOTE — Patient Outreach (Signed)
 Care Management  Transitions of Care Program Transitions of Care Post-discharge Week 5   07/18/2023 Name: Kristen [name] MRN: [id number] DOB: 14-Apr-1941  Subjective: Kristen [name] is a 83 y.o. year old female who is a primary care patient of Eustaquio Boyden, MD. The Care Management team Engaged with patient Engaged with patient by telephone to assess and address transitions of care needs.   Consent to Services:  Patient was given information about care management services, agreed to services, and gave verbal consent to participate.   Assessment:           SDOH Interventions    Flowsheet Row Patient Outreach from 07/18/2023 in [place] [facility] Patient Outreach from 06/14/2023 in [place] [facility] Telephone from 06/06/2023 in [place] [facility]PARTMENT Clinical Support from 05/10/2023 in [facility] [place] Telephone from 11/20/2022 in [facility] Community Care Coordination Clinical Support from 04/13/2022 in [facility] [place]  SDOH Interventions        Food Insecurity Interventions Intervention Not Indicated Intervention Not Indicated Intervention Not Indicated Intervention Not Indicated Intervention Not Indicated Intervention Not Indicated  Housing Interventions Intervention Not Indicated Intervention Not Indicated Intervention Not Indicated Intervention Not Indicated -- Intervention Not Indicated  Transportation Interventions Intervention Not Indicated Intervention Not Indicated Intervention Not Indicated Intervention Not Indicated Intervention Not Indicated Intervention Not Indicated  Utilities Interventions Intervention Not Indicated Intervention Not Indicated Intervention Not Indicated Intervention Not Indicated -- Intervention Not Indicated  Alcohol Usage Interventions -- -- -- Intervention Not Indicated (Score <7) -- --  Financial Strain Interventions --  -- -- Intervention Not Indicated -- Intervention Not Indicated  Physical Activity Interventions -- -- -- Intervention Not Indicated -- --  Stress Interventions -- -- -- Intervention Not Indicated -- Intervention Not Indicated  Social Connections Interventions -- -- -- Intervention Not Indicated -- Intervention Not Indicated  Health Literacy Interventions -- -- -- Intervention Not Indicated -- --        Goals Addressed             This Visit's Progress    COMPLETED: TOC Care Plan       Current Barriers: (Reviewed 3/26,2025) Chronic Disease Management support and education needs related to DMII   RNCM Clinical Goal(s): (Reviewed 3/26,2025) Patient will work with the Care Management team over the next 30 days to address Transition of Care Barriers: Medication access Medication Management Diet/Nutrition/Food Resources Provider appointments verbalize basic understanding of  DMII disease process and self health management plan as evidenced by controlled blood sugars, ED prevention  through collaboration with RN Care manager, provider, and care team. The patient does not take her blood sugar daily  Interventions: (Reviewed 3/26,2025) Evaluation of current treatment plan related to  self management and patient's adherence to plan as established by provider  Diabetes Interventions:  (Status:  Goal Met.) Short Term Goal (Reviewed 3/26,2025) Assessed patient's understanding of A1c goal: <7% Provided education to patient about basic DM disease process Counseled on importance of regular laboratory monitoring as prescribed Discussed plans with patient for ongoing care management follow up and provided patient with direct contact information for care management team Reviewed scheduled/upcoming provider appointments including: PCP on 07/17/23 and repeat lab work Lab Results  Component Value Date   HGBA1C 9.2 (A) 03/26/2023    Patient Goals/Self-Care Activities: (Reviewed  3/26,2025) Participate in Transition of Care Program/Attend TOC scheduled calls Take all medications as prescribed Attend all scheduled  provider appointments Call pharmacy for medication refills 3-7 days in advance of running out of medications Call provider office for new concerns or questions   Follow Up Plan:  The patient has completed the 30 Day [facility] Outreach Program. She declines referral to CCM.         Plan: The patient has been provided with contact information for the care management team and has been advised to call with any health related questions or concerns.   Please refer to Care Plan for goals and interventions.  Patient educated on red flags signs/symptoms to watch for and was encouraged to report any of these identified, any new symptoms, changes in baseline or medication regimen, change in health status / well-being, or safety concerns to PCP and / or the [facility] Case Management team.   Deidre Ala, BSN, RN [place]  [facility] - [facility] RN Care Manager [phone]

## 2023-07-19 ENCOUNTER — Encounter: Payer: Self-pay | Admitting: Family Medicine

## 2023-07-19 LAB — FRUCTOSAMINE: Fructosamine: 473 umol/L — ABNORMAL HIGH (ref 205–285)

## 2023-07-19 LAB — PARATHYROID HORMONE, INTACT (NO CA): PTH: 42 pg/mL (ref 16–77)

## 2023-07-19 NOTE — Assessment & Plan Note (Addendum)
 H/o gout vs pseudogout. Pt endorses h/o gout. Previous xrays (knee) with chondrocalcinosis.  Update uric acid levels only on prn colchicine. No recent gout flare.

## 2023-07-19 NOTE — Assessment & Plan Note (Signed)
Update renal panel 

## 2023-07-19 NOTE — Assessment & Plan Note (Signed)
 Continue plavix, statin.

## 2023-07-19 NOTE — Assessment & Plan Note (Signed)
Continue to encourage healthy diet and lifestyle choices.  

## 2023-07-19 NOTE — Assessment & Plan Note (Signed)
 Update levels on vit d 2000 units daily

## 2023-07-19 NOTE — Assessment & Plan Note (Signed)
 Encouraged continue yearly diabetic eye exams with Dr Dione Booze, next due summer 2025

## 2023-07-19 NOTE — Assessment & Plan Note (Addendum)
 Chronic, BP remaining elevated despite reported compliance with 4 drug regimen: losartan 100mg , toprol XL 12.5mg  daily, amlodipine 10mg  and hydralazine 50mg  bid.  BB dose limited by bradycardia. Hopeful that pharmacy assistance will lead to better med compliance/BP control.  Recommend sooner in office f/u - she declines, agrees to RTC 3-4 mo HTN/DM f/u visit.

## 2023-07-19 NOTE — Assessment & Plan Note (Addendum)
 Appreciate pharmacy care - seen 06/2023.

## 2023-07-19 NOTE — Assessment & Plan Note (Signed)
 H/o strokes

## 2023-07-19 NOTE — Assessment & Plan Note (Signed)
 Stable period. H/o stroke 2018

## 2023-07-19 NOTE — Assessment & Plan Note (Addendum)
 Chronic, only on basaglar 22u daily - now more regularly taking and notes improvement in sugar control.  Continue this.  Intolerance to oral metformin  Recommend sooner in office f/u - she declines, agrees to RTC 3-4 mo HTN/DM f/u visit.

## 2023-07-19 NOTE — Assessment & Plan Note (Signed)
Update levels off replacement. 

## 2023-07-19 NOTE — Assessment & Plan Note (Deleted)
Update thyroid function.  

## 2023-07-19 NOTE — Assessment & Plan Note (Addendum)
 Update CBC - she is now off oral iron replacement.  ?anemia related to CKD

## 2023-07-19 NOTE — Assessment & Plan Note (Signed)
 Chronic, update levels on atorvastatin The ASCVD Risk score (Arnett DK, et al., 2019) failed to calculate for the following reasons:   The 2019 ASCVD risk score is only valid for ages 28 to 61   Risk score cannot be calculated because patient has a medical history suggesting prior/existing ASCVD

## 2023-07-26 ENCOUNTER — Other Ambulatory Visit (HOSPITAL_COMMUNITY): Payer: Self-pay

## 2023-07-26 ENCOUNTER — Telehealth: Payer: Self-pay | Admitting: Family Medicine

## 2023-07-26 ENCOUNTER — Encounter: Payer: Self-pay | Admitting: Pharmacist

## 2023-07-26 NOTE — Progress Notes (Signed)
 Attempted to contact patient to discuss patient assistance options for Jardiance. No answer, unable to leave voicemail.

## 2023-07-26 NOTE — Telephone Encounter (Signed)
 Can we get some updated sugar readings on basaglar 22u daily - to see if we need to titrate insulin dose due to h/o uncontrolled diabetes   Also, pt would be interested in [place] commencement - will forward to [place] to see if pt is eligible for PAP for this (already receiving basaglar PAP through [employer]).

## 2023-07-26 NOTE — Telephone Encounter (Signed)
 No answer no voice mail

## 2023-07-27 ENCOUNTER — Other Ambulatory Visit (HOSPITAL_COMMUNITY): Payer: Self-pay

## 2023-07-27 NOTE — Telephone Encounter (Signed)
 Spoke with pt to get recent BS readings. States she hasn't checked it in few days due to unexpected out-of-town company visiting all week. States she will call back Tues or Wed next week with readings. I mentioned Dr Timoteo Expose message about trying Jardiance. Pt states she doesn't want to start any more meds at this time. Fyi to Dr Reece Agar.

## 2023-07-27 NOTE — Telephone Encounter (Signed)
 Noted.

## 2023-07-30 ENCOUNTER — Other Ambulatory Visit (HOSPITAL_COMMUNITY): Payer: Self-pay

## 2023-07-30 ENCOUNTER — Other Ambulatory Visit: Payer: Self-pay

## 2023-08-04 ENCOUNTER — Other Ambulatory Visit (HOSPITAL_COMMUNITY): Payer: Self-pay

## 2023-08-07 ENCOUNTER — Other Ambulatory Visit: Payer: Self-pay

## 2023-08-07 ENCOUNTER — Other Ambulatory Visit: Payer: Self-pay | Admitting: Family Medicine

## 2023-08-07 ENCOUNTER — Other Ambulatory Visit (HOSPITAL_COMMUNITY): Payer: Self-pay

## 2023-08-07 DIAGNOSIS — I700 Atherosclerosis of aorta: Secondary | ICD-10-CM

## 2023-08-07 DIAGNOSIS — E785 Hyperlipidemia, unspecified: Secondary | ICD-10-CM

## 2023-08-07 NOTE — Telephone Encounter (Signed)
 Atorvastatin last filled:  07/13/23, #30 Plavix last filled:  07/13/23, #30 Last OV:  07/17/23, CPE Next OV:  11/19/23, 4 mo DM f/u

## 2023-08-08 ENCOUNTER — Other Ambulatory Visit: Payer: Self-pay

## 2023-08-09 ENCOUNTER — Other Ambulatory Visit (HOSPITAL_COMMUNITY): Payer: Self-pay

## 2023-08-09 ENCOUNTER — Other Ambulatory Visit: Payer: Self-pay

## 2023-08-09 MED ORDER — ATORVASTATIN CALCIUM 40 MG PO TABS
40.0000 mg | ORAL_TABLET | Freq: Every day | ORAL | 1 refills | Status: DC
  Filled 2023-08-09: qty 90, 90d supply, fill #0
  Filled 2023-08-10: qty 30, 30d supply, fill #0
  Filled 2023-08-16 – 2023-09-04 (×2): qty 30, 30d supply, fill #1
  Filled 2023-09-24 – 2023-10-02 (×2): qty 30, 30d supply, fill #2
  Filled 2023-10-22 – 2023-11-01 (×2): qty 30, 30d supply, fill #3
  Filled 2023-11-29: qty 30, 30d supply, fill #4
  Filled 2024-01-01: qty 30, 30d supply, fill #5

## 2023-08-09 MED ORDER — CLOPIDOGREL BISULFATE 75 MG PO TABS
75.0000 mg | ORAL_TABLET | Freq: Every morning | ORAL | 1 refills | Status: DC
  Filled 2023-08-09: qty 90, 90d supply, fill #0
  Filled 2023-08-10: qty 30, 30d supply, fill #0
  Filled 2023-08-16 – 2023-09-04 (×2): qty 30, 30d supply, fill #1
  Filled 2023-09-24 – 2023-10-02 (×2): qty 30, 30d supply, fill #2
  Filled 2023-10-22 – 2023-11-01 (×2): qty 30, 30d supply, fill #3
  Filled 2023-11-29: qty 30, 30d supply, fill #4
  Filled 2024-01-01: qty 30, 30d supply, fill #5

## 2023-08-09 NOTE — Telephone Encounter (Signed)
 ERx

## 2023-08-10 ENCOUNTER — Other Ambulatory Visit (HOSPITAL_COMMUNITY): Payer: Self-pay

## 2023-08-10 ENCOUNTER — Other Ambulatory Visit: Payer: Self-pay

## 2023-08-13 ENCOUNTER — Other Ambulatory Visit: Payer: Self-pay

## 2023-08-15 ENCOUNTER — Other Ambulatory Visit (HOSPITAL_COMMUNITY): Payer: Self-pay

## 2023-08-15 MED FILL — Colchicine Tab 0.6 MG: ORAL | 30 days supply | Qty: 30 | Fill #0 | Status: AC

## 2023-08-16 ENCOUNTER — Other Ambulatory Visit: Payer: Self-pay

## 2023-09-04 ENCOUNTER — Other Ambulatory Visit: Payer: Self-pay

## 2023-09-12 ENCOUNTER — Other Ambulatory Visit: Payer: Self-pay | Admitting: Family Medicine

## 2023-09-12 DIAGNOSIS — E1159 Type 2 diabetes mellitus with other circulatory complications: Secondary | ICD-10-CM

## 2023-09-12 NOTE — Telephone Encounter (Signed)
 Copied from CRM [id number]. Topic: Clinical - Medication Refill >> Sep 12, 2023 10:32 AM Sophia H wrote: Medication: Insulin  Glargine (BASAGLAR  KWIKPEN) 100 UNIT/ML **Pt is on her last pen now,  states pharmacy says to reach out by phone after sent to confirm was received.   Has the patient contacted their pharmacy? Yes (Agent: If no, request that the patient contact the pharmacy for the refill. If patient does not wish to contact the pharmacy document the reason why and proceed with request.) (Agent: If yes, when and what did the pharmacy advise?)  This is the patient's preferred pharmacy:   [facility] [place], [place] - [address] [place] [address] Phone: [phone] Fax: [phone]  Is this the correct pharmacy for this prescription? Yes If no, delete pharmacy and type the correct one.   Has the prescription been filled recently? Yes  Is the patient out of the medication? Yes  Has the patient been seen for an appointment in the last year OR does the patient have an upcoming appointment? Yes  Can we respond through MyChart? No, pt prefers phone call. [phone], if no answer ok to lvm.   Agent: Please be advised that Rx refills may take up to 3 business days. We ask that you follow-up with your pharmacy.

## 2023-09-13 NOTE — Telephone Encounter (Signed)
 Basaglar  Last rx:  07/17/23, #15 mL Last OV:  07/17/23, CPE Next OV:  10/30/23, 4 mo DM f/u

## 2023-09-14 MED ORDER — BASAGLAR KWIKPEN 100 UNIT/ML ~~LOC~~ SOPN: 22.0000 [IU] | PEN_INJECTOR | Freq: Every day | SUBCUTANEOUS | 2 refills | Status: DC

## 2023-09-14 NOTE — Telephone Encounter (Signed)
 ERx

## 2023-09-24 ENCOUNTER — Other Ambulatory Visit: Payer: Self-pay

## 2023-10-02 ENCOUNTER — Other Ambulatory Visit: Payer: Self-pay

## 2023-10-08 ENCOUNTER — Other Ambulatory Visit: Payer: Self-pay

## 2023-10-08 ENCOUNTER — Other Ambulatory Visit (HOSPITAL_COMMUNITY): Payer: Self-pay

## 2023-10-11 DIAGNOSIS — E1140 Type 2 diabetes mellitus with diabetic neuropathy, unspecified: Secondary | ICD-10-CM | POA: Diagnosis not present

## 2023-10-11 DIAGNOSIS — B351 Tinea unguium: Secondary | ICD-10-CM | POA: Diagnosis not present

## 2023-10-11 DIAGNOSIS — L600 Ingrowing nail: Secondary | ICD-10-CM | POA: Diagnosis not present

## 2023-10-11 DIAGNOSIS — M79675 Pain in left toe(s): Secondary | ICD-10-CM | POA: Diagnosis not present

## 2023-10-11 DIAGNOSIS — Z794 Long term (current) use of insulin: Secondary | ICD-10-CM | POA: Diagnosis not present

## 2023-10-11 DIAGNOSIS — M79674 Pain in right toe(s): Secondary | ICD-10-CM | POA: Diagnosis not present

## 2023-10-22 ENCOUNTER — Other Ambulatory Visit: Payer: Self-pay

## 2023-11-01 ENCOUNTER — Other Ambulatory Visit: Payer: Self-pay

## 2023-11-14 ENCOUNTER — Other Ambulatory Visit: Payer: Self-pay | Admitting: Family Medicine

## 2023-11-14 ENCOUNTER — Other Ambulatory Visit: Payer: Self-pay

## 2023-11-14 DIAGNOSIS — E118 Type 2 diabetes mellitus with unspecified complications: Secondary | ICD-10-CM

## 2023-11-14 MED ORDER — INSUPEN PEN NEEDLES 31G X 5 MM MISC
3 refills | Status: AC
  Filled 2023-11-29: qty 100, 100d supply, fill #0

## 2023-11-15 ENCOUNTER — Other Ambulatory Visit: Payer: Self-pay

## 2023-11-19 ENCOUNTER — Ambulatory Visit (INDEPENDENT_AMBULATORY_CARE_PROVIDER_SITE_OTHER): Admitting: Family Medicine

## 2023-11-19 ENCOUNTER — Other Ambulatory Visit: Payer: Self-pay

## 2023-11-19 ENCOUNTER — Telehealth: Payer: Self-pay | Admitting: Family Medicine

## 2023-11-19 ENCOUNTER — Encounter: Payer: Self-pay | Admitting: Family Medicine

## 2023-11-19 ENCOUNTER — Other Ambulatory Visit (HOSPITAL_COMMUNITY): Payer: Self-pay

## 2023-11-19 VITALS — BP 138/60 | HR 61 | Temp 98.3°F | Ht 61.5 in | Wt 177.0 lb

## 2023-11-19 DIAGNOSIS — I1A0 Resistant hypertension: Secondary | ICD-10-CM | POA: Diagnosis not present

## 2023-11-19 DIAGNOSIS — E1165 Type 2 diabetes mellitus with hyperglycemia: Secondary | ICD-10-CM

## 2023-11-19 DIAGNOSIS — E113392 Type 2 diabetes mellitus with moderate nonproliferative diabetic retinopathy without macular edema, left eye: Secondary | ICD-10-CM

## 2023-11-19 DIAGNOSIS — Z794 Long term (current) use of insulin: Secondary | ICD-10-CM | POA: Diagnosis not present

## 2023-11-19 LAB — POCT GLYCOSYLATED HEMOGLOBIN (HGB A1C): Hemoglobin A1C: 10.3 % — AB (ref 4.0–5.6)

## 2023-11-19 MED ORDER — BASAGLAR KWIKPEN 100 UNIT/ML ~~LOC~~ SOPN
24.0000 [IU] | PEN_INJECTOR | Freq: Every day | SUBCUTANEOUS | 2 refills | Status: DC
  Filled 2023-11-19: qty 15, 62d supply, fill #0

## 2023-11-19 NOTE — Assessment & Plan Note (Signed)
 Chronic, improved BP control - continue current regimen.

## 2023-11-19 NOTE — Assessment & Plan Note (Signed)
 Refer to new eye clinic per pt request

## 2023-11-19 NOTE — Progress Notes (Signed)
 Ph: [phone] Fax: [phone]   Patient ID: Kristen [name], female    DOB: 03-26-1941, 83 y.o.   MRN: [id number]  This visit was conducted in person.  BP 138/60   Pulse 61   Temp 98.3 F (36.8 C) (Oral)   Ht 5' 1.5 (1.562 m)   Wt 177 lb (80.3 kg)   SpO2 95%   BMI 32.90 kg/m    CC: 4 mo DM f/u visit  Subjective:   HPI: Kristen [name] is a 83 y.o. female presenting on 11/19/2023 for Medical Management of Chronic Issues ( DM F/U)   Uses wrist cuff for BP checks at home.   DM - does check sugars last week Thurs fasting and thinks it was normal - forgot to bring log today. Compliant with antihyperglycemic regimen which includes: basaglar  22u nightly. Denies low sugars or hypoglycemic symptoms. Denies paresthesias, blurry vision. Last diabetic eye exam 09/2022 - DUE (Groat retired). Requests new referral to establish care - agrees to [facility]. Glucometer brand: one touch delica. Last foot exam: seen 09/2023 by Dr Neill. DSME: declined. Lab Results  Component Value Date   HGBA1C 10.3 (A) 11/19/2023   Diabetic Foot Exam - Simple   No data filed    Lab Results  Component Value Date   MICROALBUR 47.6 (H) 07/17/2023    Notes progressing dysphagia but declines GI eval at this time - will let me know.  H/o dilation on EGD 07/2020.      Relevant past medical, surgical, family and social history reviewed and updated as indicated. Interim medical history since our last visit reviewed. Allergies and medications reviewed and updated. Outpatient Medications Prior to Visit  Medication Sig Dispense Refill   amLODipine  (NORVASC ) 10 MG tablet Take 1 tablet (10 mg total) by mouth daily. 90 tablet 3   atorvastatin  (LIPITOR) 40 MG tablet Take 1 tablet (40 mg total) by mouth daily. 90 tablet 1   Blood Glucose Monitoring Suppl (ONETOUCH VERIO) w/Device KIT Use to check blood sugar up to 4 times a day as directed 1 kit 0   Cholecalciferol  (VITAMIN D ) 2000 units CAPS Take 2,000  Units by mouth daily.     clopidogrel  (PLAVIX ) 75 MG tablet Take 1 tablet (75 mg total) by mouth every morning. 90 tablet 1   colchicine  0.6 MG tablet Take 1 tablet (0.6 mg total) by mouth daily as needed for gout. 30 tablet 10   glucose blood (ONETOUCH VERIO) test strip USE TO TEST BLOOD SUGAR 4 TIMES DAILY AS DIRECTED 100 each 10   hydrALAZINE  (APRESOLINE ) 50 MG tablet Take 1 tablet (50 mg total) by mouth at breakfast and at bedtime. 60 tablet 11   Insulin  Pen Needle (INSUPEN PEN NEEDLES) 31G X 5 MM MISC Use as directed to inject insulin  daily 100 each 3   losartan  (COZAAR ) 100 MG tablet Take 1 tablet (100 mg total) by mouth every morning. 90 tablet 3   metoprolol  succinate (TOPROL -XL) 25 MG 24 hr tablet Take 0.5 tablets (12.5 mg total) by mouth daily. 45 tablet 3   Miconazole  2 % POWD Apply 1 Application topically 2 (two) times daily as needed (fungal skin infection). 43 g 1   nitroGLYCERIN  (NITROSTAT ) 0.4 MG SL tablet Place 0.4 mg under the tongue every 5 (five) minutes as needed for chest pain.     OneTouch Delica Lancets 33G MISC Use as directed to test blood sugar once daily. 100 each 1   potassium chloride  (KLOR-CON ) 10 MEQ  tablet Take 1 tablet (10 mEq total) by mouth every Monday, Wednesday, and Friday. 70 tablet 3   Insulin  Glargine (BASAGLAR  KWIKPEN) 100 UNIT/ML Inject 22 Units into the skin daily. 30 mL 2   Insulin  Pen Needle (COMFORT EZ PEN NEEDLES) 31G X 5 MM MISC Use as instructed to inject insulin  daily 100 each 3   Lancets (ONETOUCH DELICA PLUS LANCET33G) MISC Use as instructed to check blood sugar once a day 100 each 3   No facility-administered medications prior to visit.     Per HPI unless specifically indicated in ROS section below Review of Systems  Objective:  BP 138/60   Pulse 61   Temp 98.3 F (36.8 C) (Oral)   Ht 5' 1.5 (1.562 m)   Wt 177 lb (80.3 kg)   SpO2 95%   BMI 32.90 kg/m   Wt Readings from Last 3 Encounters:  11/19/23 177 lb (80.3 kg)  07/17/23 179  lb 4 oz (81.3 kg)  07/04/23 177 lb (80.3 kg)      Physical Exam Vitals and nursing note reviewed.  Constitutional:      Appearance: Normal appearance. She is not ill-appearing.  Eyes:     Extraocular Movements: Extraocular movements intact.     Conjunctiva/sclera: Conjunctivae normal.     Pupils: Pupils are equal, round, and reactive to light.  Cardiovascular:     Rate and Rhythm: Normal rate and regular rhythm.     Pulses: Normal pulses.     Heart sounds: Normal heart sounds. No murmur heard. Pulmonary:     Effort: Pulmonary effort is normal. No respiratory distress.     Breath sounds: Normal breath sounds. No wheezing, rhonchi or rales.  Musculoskeletal:     Right lower leg: No edema.     Left lower leg: No edema.     Comments: See HPI for foot exam if done  Skin:    General: Skin is warm and dry.     Findings: No rash.  Neurological:     Mental Status: She is alert.  Psychiatric:        Mood and Affect: Mood normal.        Behavior: Behavior normal.       Results for orders placed or performed in visit on 11/19/23  POCT glycosylated hemoglobin (Hb A1C)   Collection Time: 11/19/23 10:47 AM  Result Value Ref Range   Hemoglobin A1C 10.3 (A) 4.0 - 5.6 %   HbA1c POC (<> result, manual entry)     HbA1c, POC (prediabetic range)     HbA1c, POC (controlled diabetic range)     Lab Results  Component Value Date   VITAMINB12 599 07/17/2023    Lab Results  Component Value Date   NA 136 07/17/2023   CL 100 07/17/2023   K 4.6 07/17/2023   CO2 29 07/17/2023   BUN 21 07/17/2023   CREATININE 0.96 07/17/2023   GFR 55.32 (L) 07/17/2023   CALCIUM  9.4 07/17/2023   PHOS 3.2 07/17/2023   ALBUMIN 3.8 07/17/2023   GLUCOSE 306 (H) 07/17/2023   Assessment & Plan:   Problem List Items Addressed This Visit     Resistant hypertension   Chronic, improved BP control - continue current regimen.       Type 2 diabetes mellitus with hyperglycemia (HCC) - Primary   Chronic, has  decline additional medication. A1c improving but still remaining elevated  Due to high A1c, will titrate basaglar  to 24u nightly (9% increase) with option to increase  to 25u nightly.  RTC 3 mo DM f/u visit.       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    Dispense:  30 mL    Refill:  2    Orders Placed This Encounter  Procedures   Ambulatory referral to Optometry    Referral Priority:   Routine    Referral Type:   Vision Training and development officer)    Referral Reason:   Specialty Services Required    Requested Specialty:   Optometry    Number of Visits Requested:   1   POCT glycosylated hemoglobin (Hb A1C)    Patient Instructions  I will place referral to [facility] to establish care for diabetic eye exam.  Increase basaglar  to 24 units nightly, if fasting sugar staying >150, then increase again to 25 units nightly.  Return in 3 months diabetes for follow up.   Follow up plan: Return in about 3 months (around 02/19/2024), or if symptoms worsen or fail to improve, for follow up visit.  Anton Blas, MD

## 2023-11-19 NOTE — Assessment & Plan Note (Signed)
 Chronic, has decline additional medication. A1c improving but still remaining elevated  Due to high A1c, will titrate basaglar  to 24u nightly (9% increase) with option to increase to 25u nightly.  RTC 3 mo DM f/u visit.

## 2023-11-19 NOTE — Telephone Encounter (Signed)
 Copied from CRM [id number]. Topic: General - Other >> Nov 19, 2023  1:39 PM Jasmin G wrote: Reason for CRM: Staff from [facility] called due to pt being referred over to clinic for a diabetic eye exam but not being able to reach her or leave a voicemail. Please call back at [phone] if needed   Tried contacting pt & her son, Rocky (on [place]), no answers, [email or web address]. FYI

## 2023-11-19 NOTE — Patient Instructions (Addendum)
 I will place referral to [facility] to establish care for diabetic eye exam.  Increase basaglar  to 24 units nightly, if fasting sugar staying >150, then increase again to 25 units nightly.  Return in 3 months diabetes for follow up.

## 2023-11-19 NOTE — Telephone Encounter (Signed)
 Do you know of another way to reach patient

## 2023-11-20 NOTE — Telephone Encounter (Signed)
 Don't know of a better way to reach her  Can we try again today? Thanks.

## 2023-11-20 NOTE — Telephone Encounter (Signed)
 Called patient verified at home and right number. Called [facility] to let them know she will be available to answer all afternoon. They will call her soon.

## 2023-11-21 ENCOUNTER — Other Ambulatory Visit (HOSPITAL_COMMUNITY): Payer: Self-pay

## 2023-11-22 ENCOUNTER — Other Ambulatory Visit (HOSPITAL_COMMUNITY): Payer: Self-pay

## 2023-11-23 ENCOUNTER — Other Ambulatory Visit (HOSPITAL_COMMUNITY): Payer: Self-pay

## 2023-11-23 ENCOUNTER — Telehealth (HOSPITAL_COMMUNITY): Payer: Self-pay

## 2023-11-26 ENCOUNTER — Other Ambulatory Visit (HOSPITAL_COMMUNITY): Payer: Self-pay

## 2023-11-26 MED ORDER — LANTUS SOLOSTAR 100 UNIT/ML ~~LOC~~ SOPN
24.0000 [IU] | PEN_INJECTOR | Freq: Every day | SUBCUTANEOUS | 2 refills | Status: DC
  Filled 2023-11-26: qty 24, 100d supply, fill #0

## 2023-11-26 NOTE — Telephone Encounter (Signed)
Attempted to contact pt.  No answer, no vm.  Need to relay Dr. Synthia Innocent message.

## 2023-11-26 NOTE — Telephone Encounter (Signed)
 Plz notify pt - insurance is requiring switch in insulin  brands - from Basaglar  to Lantus .  I have therefore sent new prescription for lantus  to her pharmacy.

## 2023-11-26 NOTE — Telephone Encounter (Signed)
 Pharmacy Patient Advocate Encounter   Received notification from Pt Calls Messages that prior authorization for Basaglar  kwikpen 100 unit/ml is required/requested.   Insurance verification completed.   The patient is insured through [facility] ADVANTAGE/RX ADVANCE .   Per test claim:  Lanus Solostar is preferred by the insurance.  If suggested medication is appropriate, Please send in a new RX and discontinue this one. If not, please advise as to why it's not appropriate so that we may request a Prior Authorization. Please note, some preferred medications may still require a PA.  If the suggested medications have not been trialed and there are no contraindications to their use, the PA will not be submitted, as it will not be approved.  *did a test claim for Lantus  and it came back as a zero copay

## 2023-11-27 ENCOUNTER — Other Ambulatory Visit: Payer: Self-pay

## 2023-11-27 NOTE — Telephone Encounter (Signed)
Attempted to contact pt.  No answer, no vm.  Need to relay Dr. Synthia Innocent message.

## 2023-11-28 NOTE — Telephone Encounter (Signed)
 Called patient reviewed all information and repeated back to me. Will call if any questions.  ? ?

## 2023-11-29 ENCOUNTER — Other Ambulatory Visit: Payer: Self-pay

## 2023-12-03 DIAGNOSIS — Z9841 Cataract extraction status, right eye: Secondary | ICD-10-CM | POA: Diagnosis not present

## 2023-12-03 DIAGNOSIS — E113293 Type 2 diabetes mellitus with mild nonproliferative diabetic retinopathy without macular edema, bilateral: Secondary | ICD-10-CM | POA: Diagnosis not present

## 2023-12-03 DIAGNOSIS — H5211 Myopia, right eye: Secondary | ICD-10-CM | POA: Diagnosis not present

## 2023-12-03 DIAGNOSIS — Z9842 Cataract extraction status, left eye: Secondary | ICD-10-CM | POA: Diagnosis not present

## 2023-12-03 LAB — HM DIABETES EYE EXAM

## 2023-12-07 ENCOUNTER — Other Ambulatory Visit: Payer: Self-pay | Admitting: Family Medicine

## 2023-12-07 DIAGNOSIS — Z1231 Encounter for screening mammogram for malignant neoplasm of breast: Secondary | ICD-10-CM

## 2023-12-14 ENCOUNTER — Ambulatory Visit
Admission: RE | Admit: 2023-12-14 | Discharge: 2023-12-14 | Disposition: A | Discharge status: Home / Self Care | Source: Ambulatory Visit | Attending: Family Medicine | Admitting: Family Medicine

## 2023-12-14 DIAGNOSIS — Z1231 Encounter for screening mammogram for malignant neoplasm of breast: Secondary | ICD-10-CM | POA: Diagnosis not present

## 2023-12-18 ENCOUNTER — Ambulatory Visit: Payer: Self-pay | Admitting: Family Medicine

## 2023-12-25 ENCOUNTER — Telehealth: Payer: Self-pay

## 2023-12-25 NOTE — Telephone Encounter (Signed)
 Patient was identified as falling into the [place] Measure - Diabetes.   Patient was: Appointment already scheduled for:  02/19/24.   Medication changes have been made for patient. Will follow up at next office visit.

## 2023-12-26 ENCOUNTER — Other Ambulatory Visit: Payer: Self-pay

## 2024-01-01 ENCOUNTER — Other Ambulatory Visit: Payer: Self-pay

## 2024-01-02 ENCOUNTER — Other Ambulatory Visit: Payer: Self-pay

## 2024-01-23 ENCOUNTER — Other Ambulatory Visit: Payer: Self-pay

## 2024-01-23 ENCOUNTER — Other Ambulatory Visit: Payer: Self-pay | Admitting: Family Medicine

## 2024-01-23 ENCOUNTER — Other Ambulatory Visit (HOSPITAL_COMMUNITY): Payer: Self-pay

## 2024-01-23 DIAGNOSIS — E1169 Type 2 diabetes mellitus with other specified complication: Secondary | ICD-10-CM

## 2024-01-23 DIAGNOSIS — I700 Atherosclerosis of aorta: Secondary | ICD-10-CM

## 2024-01-23 MED ORDER — ATORVASTATIN CALCIUM 40 MG PO TABS
40.0000 mg | ORAL_TABLET | Freq: Every day | ORAL | 1 refills | Status: AC
  Filled 2024-01-31: qty 30, 30d supply, fill #0
  Filled 2024-03-03: qty 30, 30d supply, fill #1
  Filled 2024-04-03: qty 30, 30d supply, fill #2
  Filled 2024-05-05: qty 30, 30d supply, fill #3

## 2024-01-23 NOTE — Telephone Encounter (Signed)
 Plavix  Last filled:  01/02/24, #90 Last OV:  11/19/23, 4 mo DM f/u Next OV:  02/19/24, 3 mo DM f/u

## 2024-01-24 ENCOUNTER — Other Ambulatory Visit: Payer: Self-pay

## 2024-01-24 ENCOUNTER — Other Ambulatory Visit (HOSPITAL_COMMUNITY): Payer: Self-pay

## 2024-01-24 MED ORDER — CLOPIDOGREL BISULFATE 75 MG PO TABS
75.0000 mg | ORAL_TABLET | Freq: Every morning | ORAL | 1 refills | Status: AC
  Filled 2024-01-31: qty 30, 30d supply, fill #0
  Filled 2024-03-03: qty 30, 30d supply, fill #1
  Filled 2024-04-03: qty 30, 30d supply, fill #2
  Filled 2024-05-05: qty 30, 30d supply, fill #3

## 2024-01-29 ENCOUNTER — Other Ambulatory Visit (HOSPITAL_COMMUNITY): Payer: Self-pay

## 2024-01-29 ENCOUNTER — Other Ambulatory Visit: Payer: Self-pay

## 2024-01-31 ENCOUNTER — Other Ambulatory Visit: Payer: Self-pay

## 2024-02-04 ENCOUNTER — Other Ambulatory Visit: Payer: Self-pay

## 2024-02-04 DIAGNOSIS — B351 Tinea unguium: Secondary | ICD-10-CM | POA: Diagnosis not present

## 2024-02-04 DIAGNOSIS — E1140 Type 2 diabetes mellitus with diabetic neuropathy, unspecified: Secondary | ICD-10-CM | POA: Diagnosis not present

## 2024-02-04 DIAGNOSIS — Z794 Long term (current) use of insulin: Secondary | ICD-10-CM | POA: Diagnosis not present

## 2024-02-15 ENCOUNTER — Other Ambulatory Visit: Payer: Self-pay

## 2024-02-19 ENCOUNTER — Other Ambulatory Visit (HOSPITAL_COMMUNITY): Payer: Self-pay

## 2024-02-19 ENCOUNTER — Encounter: Payer: Self-pay | Admitting: Family Medicine

## 2024-02-19 ENCOUNTER — Ambulatory Visit: Admitting: Family Medicine

## 2024-02-19 ENCOUNTER — Other Ambulatory Visit: Payer: Self-pay

## 2024-02-19 VITALS — BP 122/88 | HR 52 | Temp 97.9°F | Ht 61.5 in | Wt 181.1 lb

## 2024-02-19 DIAGNOSIS — I1A0 Resistant hypertension: Secondary | ICD-10-CM

## 2024-02-19 DIAGNOSIS — Z794 Long term (current) use of insulin: Secondary | ICD-10-CM | POA: Diagnosis not present

## 2024-02-19 DIAGNOSIS — E1165 Type 2 diabetes mellitus with hyperglycemia: Secondary | ICD-10-CM

## 2024-02-19 LAB — POCT GLYCOSYLATED HEMOGLOBIN (HGB A1C): Hemoglobin A1C: 8.9 % — AB (ref 4.0–5.6)

## 2024-02-19 MED ORDER — AMLODIPINE BESYLATE 5 MG PO TABS
5.0000 mg | ORAL_TABLET | Freq: Every day | ORAL | 1 refills | Status: AC
  Filled 2024-02-19: qty 90, 90d supply, fill #0
  Filled 2024-03-03: qty 30, 30d supply, fill #0
  Filled 2024-04-03: qty 30, 30d supply, fill #1
  Filled 2024-05-05: qty 30, 30d supply, fill #2

## 2024-02-19 NOTE — Assessment & Plan Note (Addendum)
 Chronic, great control on current regimen however she notes marked fatigue rundown whenever she takes her amlodipine  10mg  daily.  Will trial lower dose 5mg  daily. I did ask her to monitor blood pressures at home and let me know if starts running elevated for further recommendations.

## 2024-02-19 NOTE — Patient Instructions (Addendum)
 Drop amlodipine  tablet dose (white round pill) to 5mg  daily - new dose sent to pharmacy Monitor blood pressures to ensure not running too high with new lower dose.  Sugars are doing better but still too high - I recommend increasing basaglar  to 25 units daily Return in 6 months for physical/wellness visit.

## 2024-02-19 NOTE — Progress Notes (Signed)
 Ph: [phone] Fax: [phone]   Patient ID: Kristen [name], female    DOB: 1940/05/12, 83 y.o.   MRN: [id number]  This visit was conducted in person.  BP 122/88   Pulse (!) 52   Temp 97.9 F (36.6 C) (Oral)   Ht 5' 1.5 (1.562 m)   Wt 181 lb 2 oz (82.2 kg)   SpO2 97%   BMI 33.67 kg/m   BP Readings from Last 3 Encounters:  02/19/24 122/88  11/19/23 138/60  07/17/23 (!) 166/78    CC: DM f/u visit  Subjective:   HPI: Kristen [name] is a 83 y.o. female presenting on 02/19/2024 for Medical Management of Chronic Issues (Pt here for 3 mo DM f/u)   Notes when she takes amlodipine  10mg  she feels run down.  HTN - well controlled on Toprol  XL 12.5mg  daily, losartan  100mg  daily, amlodipine  10mg , hydralazine  50mg  bid. Rarely checks BP at home, not recently. No HA, vision changes, CP/tightness, SOB, leg swelling.  She has been trying to walk more regularly.   Receives basaglar  through [employer] until end of 2025.   She notes she had a slice of chocolate cake yesterday DM - does not regularly check sugars just occasionally, Sunday AM it was low 100s, PM 218. Compliant with antihyperglycemic regimen which includes: insulin  glargine (basaglar ) 24u daily. Previous intolerance to metformin  (chills, dizziness), has declined further oral medications. Denies low sugars or hypoglycemic symptoms. Denies paresthesias, blurry vision. Last diabetic eye exam 11/2023 [facility]). Glucometer brand: one touch verio. Last foot exam: 09/2023, seen Dr Neill 01/2024. DSME: has declined. Lab Results  Component Value Date   HGBA1C 8.9 (A) 02/19/2024   Diabetic Foot Exam - Simple   No data filed    Lab Results  Component Value Date   MICROALBUR 47.6 (H) 07/17/2023         Relevant past medical, surgical, family and social history reviewed and updated as indicated. Interim medical history since our last visit reviewed. Allergies and medications reviewed and updated. Outpatient  Medications Prior to Visit  Medication Sig Dispense Refill   atorvastatin  (LIPITOR) 40 MG tablet Take 1 tablet (40 mg total) by mouth daily. 90 tablet 1   Blood Glucose Monitoring Suppl (ONETOUCH VERIO) w/Device KIT Use to check blood sugar up to 4 times a day as directed 1 kit 0   Cholecalciferol  (VITAMIN D ) 2000 units CAPS Take 2,000 Units by mouth daily.     clopidogrel  (PLAVIX ) 75 MG tablet Take 1 tablet (75 mg total) by mouth every morning. 90 tablet 1   colchicine  0.6 MG tablet Take 1 tablet (0.6 mg total) by mouth daily as needed for gout. 30 tablet 10   glucose blood (ONETOUCH VERIO) test strip USE TO TEST BLOOD SUGAR 4 TIMES DAILY AS DIRECTED 100 each 10   hydrALAZINE  (APRESOLINE ) 50 MG tablet Take 1 tablet (50 mg total) by mouth at breakfast and at bedtime. 60 tablet 11   Insulin  Pen Needle (INSUPEN PEN NEEDLES) 31G X 5 MM MISC Use as directed to inject insulin  daily 100 each 3   losartan  (COZAAR ) 100 MG tablet Take 1 tablet (100 mg total) by mouth every morning. 90 tablet 3   metoprolol  succinate (TOPROL -XL) 25 MG 24 hr tablet Take 0.5 tablets (12.5 mg total) by mouth daily. 45 tablet 3   Miconazole  2 % POWD Apply 1 Application topically 2 (two) times daily as needed (fungal skin infection). 43 g 1   nitroGLYCERIN  (NITROSTAT ) 0.4  MG SL tablet Place 0.4 mg under the tongue every 5 (five) minutes as needed for chest pain.     OneTouch Delica Lancets 33G MISC Use as directed to test blood sugar once daily. 100 each 1   potassium chloride  (KLOR-CON ) 10 MEQ tablet Take 1 tablet (10 mEq total) by mouth every Monday, Wednesday, and Friday. 70 tablet 3   amLODipine  (NORVASC ) 10 MG tablet Take 1 tablet (10 mg total) by mouth daily. 90 tablet 3   Insulin  Glargine (BASAGLAR  KWIKPEN) 100 UNIT/ML Inject 24 Units into the skin daily. 30 mL 2   insulin  glargine (LANTUS  SOLOSTAR) 100 UNIT/ML Solostar Pen Inject 24 Units into the skin daily. 30 mL 2   Insulin  Glargine (BASAGLAR  KWIKPEN) 100 UNIT/ML  Inject 25 Units into the skin daily.     No facility-administered medications prior to visit.     Per HPI unless specifically indicated in ROS section below Review of Systems  Objective:  BP 122/88   Pulse (!) 52   Temp 97.9 F (36.6 C) (Oral)   Ht 5' 1.5 (1.562 m)   Wt 181 lb 2 oz (82.2 kg)   SpO2 97%   BMI 33.67 kg/m   Wt Readings from Last 3 Encounters:  02/19/24 181 lb 2 oz (82.2 kg)  11/19/23 177 lb (80.3 kg)  07/17/23 179 lb 4 oz (81.3 kg)      Physical Exam Vitals and nursing note reviewed.  Constitutional:      Appearance: Normal appearance. She is not ill-appearing.  HENT:     Head: Normocephalic and atraumatic.     Mouth/Throat:     Mouth: Mucous membranes are moist.     Pharynx: Oropharynx is clear. No oropharyngeal exudate or posterior oropharyngeal erythema.  Eyes:     Extraocular Movements: Extraocular movements intact.     Conjunctiva/sclera: Conjunctivae normal.     Pupils: Pupils are equal, round, and reactive to light.  Cardiovascular:     Rate and Rhythm: Normal rate and regular rhythm.     Pulses: Normal pulses.     Heart sounds: Normal heart sounds. No murmur heard. Pulmonary:     Effort: Pulmonary effort is normal. No respiratory distress.     Breath sounds: Normal breath sounds. No wheezing, rhonchi or rales.  Musculoskeletal:     Right lower leg: No edema.     Left lower leg: No edema.  Skin:    General: Skin is warm and dry.     Findings: No rash.  Neurological:     Mental Status: She is alert.  Psychiatric:        Mood and Affect: Mood normal.        Behavior: Behavior normal.       Results for orders placed or performed in visit on 02/19/24  POCT glycosylated hemoglobin (Hb A1C)   Collection Time: 02/19/24 11:41 AM  Result Value Ref Range   Hemoglobin A1C 8.9 (A) 4.0 - 5.6 %   HbA1c POC (<> result, manual entry)     HbA1c, POC (prediabetic range)     HbA1c, POC (controlled diabetic range)      Assessment & Plan:    Problem List Items Addressed This Visit     Resistant hypertension   Chronic, great control on current regimen however she notes marked fatigue rundown whenever she takes her amlodipine  10mg  daily.  Will trial lower dose 5mg  daily. I did ask her to monitor blood pressures at home and let me know if starts  running elevated for further recommendations.       Relevant Medications   amLODipine  (NORVASC ) 5 MG tablet   Type 2 diabetes mellitus with hyperglycemia (HCC) - Primary   Chronic, has declined additional medication. Metformin  intolerance.  Last visit we increased basaglar  to 24u nightly.  A1c improved but still above goal.  Will further increase to 25u daily - reassess at CPE.       Relevant Medications   Insulin  Glargine (BASAGLAR  KWIKPEN) 100 UNIT/ML   Other Relevant Orders   POCT glycosylated hemoglobin (Hb A1C) (Completed)     Meds ordered this encounter  Medications   amLODipine  (NORVASC ) 5 MG tablet    Sig: Take 1 tablet (5 mg total) by mouth daily.    Dispense:  90 tablet    Refill:  1    Note new dose    Orders Placed This Encounter  Procedures   POCT glycosylated hemoglobin (Hb A1C)    Patient Instructions  Drop amlodipine  tablet dose (white round pill) to 5mg  daily - new dose sent to pharmacy Monitor blood pressures to ensure not running too high with new lower dose.  Sugars are doing better but still too high - I recommend increasing basaglar  to 25 units daily Return in 6 months for physical/wellness visit.   Follow up plan: Return in about 6 months (around 08/19/2024) for annual exam, prior fasting for blood work, medicare wellness visit.  Anton Blas, MD

## 2024-02-19 NOTE — Assessment & Plan Note (Addendum)
 Chronic, has declined additional medication. Metformin  intolerance.  Last visit we increased basaglar  to 24u nightly.  A1c improved but still above goal.  Will further increase to 25u daily - reassess at CPE.

## 2024-02-20 ENCOUNTER — Other Ambulatory Visit: Payer: Self-pay

## 2024-03-03 ENCOUNTER — Other Ambulatory Visit: Payer: Self-pay | Admitting: Family Medicine

## 2024-03-03 ENCOUNTER — Other Ambulatory Visit: Payer: Self-pay

## 2024-03-05 ENCOUNTER — Other Ambulatory Visit: Payer: Self-pay

## 2024-03-08 ENCOUNTER — Other Ambulatory Visit: Payer: Self-pay

## 2024-03-17 ENCOUNTER — Telehealth: Payer: Self-pay

## 2024-03-17 NOTE — Telephone Encounter (Signed)
 PAP renewal for Basaglar  from [employer]. Called pt, mailing pt portion to her and she will complete it and drop it off. Faxing provider portion

## 2024-03-18 ENCOUNTER — Other Ambulatory Visit: Payer: Self-pay

## 2024-03-18 NOTE — Telephone Encounter (Signed)
 Form signed and in my CMA box

## 2024-03-19 ENCOUNTER — Other Ambulatory Visit: Payer: Self-pay

## 2024-03-24 ENCOUNTER — Telehealth: Payer: Self-pay | Admitting: Family Medicine

## 2024-03-24 NOTE — Telephone Encounter (Signed)
 Patient dropped off document Patient Assistance Application, to be filled out by provider. Patient requested to send it back via Call Patient to pick up within 5-days. Document is located in providers tray at front office.Please advise at Mobile [phone]

## 2024-03-26 NOTE — Telephone Encounter (Signed)
 Received provider portion [employer] (Basaglar ),waiting on pt portion.

## 2024-03-28 NOTE — Telephone Encounter (Signed)
 Received pt portion along proof of incoem faxed to [facility] with provider portion and ins card.

## 2024-04-02 ENCOUNTER — Other Ambulatory Visit (HOSPITAL_COMMUNITY): Payer: Self-pay

## 2024-04-02 ENCOUNTER — Other Ambulatory Visit: Payer: Self-pay

## 2024-04-02 ENCOUNTER — Other Ambulatory Visit: Payer: Self-pay | Admitting: Family Medicine

## 2024-04-02 MED ORDER — COLCHICINE 0.6 MG PO TABS
0.6000 mg | ORAL_TABLET | Freq: Every day | ORAL | 0 refills | Status: AC | PRN
  Filled 2024-04-02: qty 30, 30d supply, fill #0

## 2024-04-03 ENCOUNTER — Other Ambulatory Visit: Payer: Self-pay

## 2024-04-03 ENCOUNTER — Other Ambulatory Visit (HOSPITAL_COMMUNITY): Payer: Self-pay

## 2024-04-04 ENCOUNTER — Other Ambulatory Visit: Payer: Self-pay

## 2024-04-09 ENCOUNTER — Encounter: Payer: Self-pay | Admitting: Family Medicine

## 2024-04-22 ENCOUNTER — Other Ambulatory Visit: Payer: Self-pay

## 2024-04-24 ENCOUNTER — Emergency Department
Admission: EM | Admit: 2024-04-24 | Discharge: 2024-04-25 | Disposition: A | Discharge status: Home / Self Care | Attending: Emergency Medicine | Admitting: Emergency Medicine

## 2024-04-24 ENCOUNTER — Encounter: Payer: Self-pay | Admitting: Emergency Medicine

## 2024-04-24 ENCOUNTER — Other Ambulatory Visit: Payer: Self-pay

## 2024-04-24 ENCOUNTER — Emergency Department

## 2024-04-24 DIAGNOSIS — I10 Essential (primary) hypertension: Secondary | ICD-10-CM | POA: Insufficient documentation

## 2024-04-24 DIAGNOSIS — I2510 Atherosclerotic heart disease of native coronary artery without angina pectoris: Secondary | ICD-10-CM | POA: Insufficient documentation

## 2024-04-24 DIAGNOSIS — R058 Other specified cough: Secondary | ICD-10-CM

## 2024-04-24 DIAGNOSIS — M791 Myalgia, unspecified site: Secondary | ICD-10-CM

## 2024-04-24 DIAGNOSIS — R6889 Other general symptoms and signs: Secondary | ICD-10-CM

## 2024-04-24 DIAGNOSIS — J069 Acute upper respiratory infection, unspecified: Secondary | ICD-10-CM | POA: Insufficient documentation

## 2024-04-24 DIAGNOSIS — R059 Cough, unspecified: Secondary | ICD-10-CM | POA: Diagnosis present

## 2024-04-24 LAB — CBC WITH DIFFERENTIAL/PLATELET
Abs Immature Granulocytes: 0.03 K/uL (ref 0.00–0.07)
Basophils Absolute: 0 K/uL (ref 0.0–0.1)
Basophils Relative: 0 %
Eosinophils Absolute: 0 K/uL (ref 0.0–0.5)
Eosinophils Relative: 0 %
HCT: 35.9 % — ABNORMAL LOW (ref 36.0–46.0)
Hemoglobin: 11.3 g/dL — ABNORMAL LOW (ref 12.0–15.0)
Immature Granulocytes: 0 %
Lymphocytes Relative: 22 %
Lymphs Abs: 2.1 K/uL (ref 0.7–4.0)
MCH: 27.6 pg (ref 26.0–34.0)
MCHC: 31.5 g/dL (ref 30.0–36.0)
MCV: 87.6 fL (ref 80.0–100.0)
Monocytes Absolute: 0.7 K/uL (ref 0.1–1.0)
Monocytes Relative: 8 %
Neutro Abs: 6.4 K/uL (ref 1.7–7.7)
Neutrophils Relative %: 70 %
Platelets: 220 K/uL (ref 150–400)
RBC: 4.1 MIL/uL (ref 3.87–5.11)
RDW: 14.3 % (ref 11.5–15.5)
WBC: 9.2 K/uL (ref 4.0–10.5)
nRBC: 0 % (ref 0.0–0.2)

## 2024-04-24 MED ORDER — KETOROLAC TROMETHAMINE 15 MG/ML IJ SOLN
15.0000 mg | Freq: Once | INTRAMUSCULAR | Status: AC
  Administered 2024-04-24: 15 mg via INTRAVENOUS
  Filled 2024-04-24: qty 1

## 2024-04-24 MED ORDER — SODIUM CHLORIDE 0.9 % IV BOLUS
1000.0000 mL | Freq: Once | INTRAVENOUS | Status: AC
  Administered 2024-04-25: 1000 mL via INTRAVENOUS

## 2024-04-24 MED ORDER — ACETAMINOPHEN 500 MG PO TABS
1000.0000 mg | ORAL_TABLET | Freq: Once | ORAL | Status: AC
  Administered 2024-04-25: 1000 mg via ORAL
  Filled 2024-04-24: qty 2

## 2024-04-24 NOTE — ED Triage Notes (Addendum)
 Patient here with generalized body aches, shortness of breath, chills, poor appetite for one day. She states she feels like she may have the flu.

## 2024-04-24 NOTE — ED Notes (Signed)
 Fall risk bundle is currently in place.

## 2024-04-24 NOTE — ED Provider Notes (Signed)
 "  [facility] Provider Note    Event Date/Time   First MD Initiated Contact with Patient 04/24/24 2303     (approximate)   History   No chief complaint on file.   HPI  Kristen [name] is a 84 y.o. female   Past medical history of poorly controlled hypertension, CAD, previous VTE, prior stroke, sleep apnea, GERD, here with 1-1/2 days of mild productive cough, myalgias diffusely, and generalized weakness.  Did not get the influenza shot this year.  No known sick contacts, lives with her son.  Denies any nausea vomiting diarrhea or urinary symptoms.  Denies chest pain or shortness of breath.  Poor p.o. intake over the last 24 hours due to illness.  External Medical Documents Reviewed: Prior outpatient family medicine notes documenting her history of uncontrolled hypertension       Physical Exam   Triage Vital Signs: ED Triage Vitals [04/24/24 2215]  Encounter Vitals Group     BP (!) 233/83     Girls Systolic BP Percentile      Girls Diastolic BP Percentile      Boys Systolic BP Percentile      Boys Diastolic BP Percentile      Pulse Rate 73     Resp 18     Temp 99.7 F (37.6 C)     Temp Source Oral     SpO2 99 %     Weight 180 lb 12.4 oz (82 kg)     Height 5' 1 (1.549 m)     Head Circumference      Peak Flow      Pain Score 7     Pain Loc      Pain Education      Exclude from Growth Chart     Most recent vital signs: Vitals:   04/24/24 2300 04/25/24 0139  BP: (!) 202/68 (!) 190/74  Pulse: 67 65  Resp: 16 17  Temp:    SpO2: 100% 100%    General: Awake, no distress.  CV:  Good peripheral perfusion.  Resp:  Normal effort.  Abd:  No distention. Other:  Hypertensive.  Otherwise vital signs normal breathing comfortably on room air, no hypoxemia.  Clear lungs without focality or wheezing in the soft and abdominal exam throughout.  Moving all extremities and her skin appears warm well-perfused.  Neck supple full range of motion  nontoxic-appearing overall.   ED Results / Procedures / Treatments   Labs (all labs ordered are listed, but only abnormal results are displayed) Labs Reviewed  BASIC METABOLIC PANEL WITH GFR - Abnormal; Notable for the following components:      Result Value   Glucose, Bld 203 (*)    BUN 26 (*)    All other components within normal limits  CBC WITH DIFFERENTIAL/PLATELET - Abnormal; Notable for the following components:   Hemoglobin 11.3 (*)    HCT 35.9 (*)    All other components within normal limits  RESP PANEL BY RT-PCR (RSV, FLU A&B, COVID)  RVPGX2  LACTIC ACID, PLASMA  CK     I ordered and reviewed the above labs they are notable for cell counts electrolytes and viral testing unremarkable.     RADIOLOGY I independently reviewed and interpreted chest x-ray and I see no obvious focality or pneumothorax I also reviewed radiologist's formal read.   PROCEDURES:  Critical Care performed: No  Procedures   MEDICATIONS ORDERED IN ED: Medications  sodium chloride  0.9 % bolus  1,000 mL (0 mLs Intravenous Stopped 04/25/24 0128)  ketorolac  (TORADOL ) 15 MG/ML injection 15 mg (15 mg Intravenous Given 04/24/24 2359)  acetaminophen  (TYLENOL ) tablet 1,000 mg (1,000 mg Oral Given 04/25/24 0000)     IMPRESSION / MDM / ASSESSMENT AND PLAN / ED COURSE  I reviewed the triage vital signs and the nursing notes.                                Patient's presentation is most consistent with acute presentation with potential threat to life or bodily function.  Differential diagnosis includes, but is not limited to, viral illness or influenza, bacterial pneumonia, sepsis, rhabdomyolysis   The patient is on the cardiac monitor to evaluate for evidence of arrhythmia and/or significant heart rate changes.  MDM:    1-1/2 days of flulike illness with a chief complaint of diffuse myalgias, and a mild productive cough.  Highly suspect influenza is being detected at a very high rate in the  community at this time.  Will check with viral swabs, check basic labs, CK, chest x-ray.  Will treat with IV fluids, Tylenol , and reassess for disposition.    Considered cardiopulmonary emergencies like ACS or PE but I highly doubt at this time given no chest pain or shortness of breath and symptomatology more consistent with upper respiratory viral illness.  Workup unremarkable and patient feels better with some fluids and antipyretic therapy.  Presumptive diagnosis of influenza influenza given very high rate in community and symptoms are consistent, despite negative testing will start on Tamiflu.   I considered hospitalization for admission or observation however given largely unremarkable workup, vital signs stable in the emergency department, and able to still perform activities of daily living at home despite her illness, I think outpatient management and follow-up is appropriate this time and she understands to return with any new or worsening symptoms.  Patient agreeable to plan.        FINAL CLINICAL IMPRESSION(S) / ED DIAGNOSES   Final diagnoses:  Uncontrolled hypertension  Myalgia  Productive cough  Viral URI with cough  Flu-like symptoms     Rx / DC Orders   ED Discharge Orders          Ordered    oseltamivir (TAMIFLU) 75 MG capsule  2 times daily        04/25/24 0127             Note:  This document was prepared using Dragon voice recognition software and may include unintentional dictation errors.    Cyrena Mylar, MD 04/25/24 [phone]  "

## 2024-04-25 LAB — BASIC METABOLIC PANEL WITH GFR
Anion gap: 10 (ref 5–15)
BUN: 26 mg/dL — ABNORMAL HIGH (ref 8–23)
CO2: 27 mmol/L (ref 22–32)
Calcium: 9.7 mg/dL (ref 8.9–10.3)
Chloride: 100 mmol/L (ref 98–111)
Creatinine, Ser: 0.91 mg/dL (ref 0.44–1.00)
GFR, Estimated: >60 mL/min
Glucose, Bld: 203 mg/dL — ABNORMAL HIGH (ref 70–99)
Potassium: 3.8 mmol/L (ref 3.5–5.1)
Sodium: 137 mmol/L (ref 135–145)

## 2024-04-25 LAB — RESP PANEL BY RT-PCR (RSV, FLU A&B, COVID)  RVPGX2
Influenza A by PCR: NEGATIVE
Influenza B by PCR: NEGATIVE
Resp Syncytial Virus by PCR: NEGATIVE
SARS Coronavirus 2 by RT PCR: NEGATIVE

## 2024-04-25 LAB — LACTIC ACID, PLASMA: Lactic Acid, Venous: 1.4 mmol/L (ref 0.5–1.9)

## 2024-04-25 LAB — CK: Total CK: 120 U/L (ref 38–234)

## 2024-04-25 MED ORDER — OSELTAMIVIR PHOSPHATE 75 MG PO CAPS: 75.0000 mg | ORAL_CAPSULE | Freq: Two times a day (BID) | ORAL | 0 refills | Status: AC

## 2024-04-25 NOTE — Discharge Instructions (Signed)
 Fortunately your testing in the emergency department did not show any emergency conditions that would warrant hospitalization or surgeries at this time.  I think you have a virus and is likely that you have the influenza virus because it is so common in our community these past couple of weeks even though you tested negative and going to start you on an influenza treatment called Tamiflu to take for the next 5 days.  Your blood pressure was quite high.  I know that your doctor has been treating your blood pressure, continue taking blood pressure medications as prescribed and follow-up with your doctor for ongoing blood pressure management  Thank you for choosing us  for your health care today!  Please see your primary doctor this week for a follow up appointment.   If you have any new, worsening, or unexpected symptoms call your doctor right away or come back to the emergency department for reevaluation.  It was my pleasure to care for you today.   Ginnie EDISON Cyrena, MD

## 2024-05-05 ENCOUNTER — Other Ambulatory Visit: Payer: Self-pay

## 2024-05-12 ENCOUNTER — Telehealth: Payer: Self-pay

## 2024-05-12 ENCOUNTER — Other Ambulatory Visit: Payer: Self-pay

## 2024-05-12 NOTE — Telephone Encounter (Signed)
 Patient was identified as falling into the [employer] [place] Measure - Diabetes.   Patient was: Appointment already scheduled for:  08/19/24.

## 2024-06-20 ENCOUNTER — Ambulatory Visit: Payer: HMO

## 2024-08-12 ENCOUNTER — Other Ambulatory Visit

## 2024-08-19 ENCOUNTER — Encounter: Admitting: Family Medicine
